# Patient Record
Sex: Female | Born: 1935
Health system: Southern US, Community
[De-identification: ages and names within clinical notes are randomized; demographics above are authoritative.]

## PROBLEM LIST (undated history)

## (undated) DIAGNOSIS — IMO0001 Reserved for inherently not codable concepts without codable children: Secondary | ICD-10-CM

## (undated) DIAGNOSIS — J45909 Unspecified asthma, uncomplicated: Secondary | ICD-10-CM

## (undated) DIAGNOSIS — I10 Essential (primary) hypertension: Secondary | ICD-10-CM

## (undated) DIAGNOSIS — J189 Pneumonia, unspecified organism: Secondary | ICD-10-CM

## (undated) DIAGNOSIS — I639 Cerebral infarction, unspecified: Secondary | ICD-10-CM

## (undated) DIAGNOSIS — H3530 Unspecified macular degeneration: Secondary | ICD-10-CM

## (undated) DIAGNOSIS — F32A Depression, unspecified: Secondary | ICD-10-CM

## (undated) DIAGNOSIS — I890 Lymphedema, not elsewhere classified: Secondary | ICD-10-CM

## (undated) DIAGNOSIS — F329 Major depressive disorder, single episode, unspecified: Secondary | ICD-10-CM

## (undated) DIAGNOSIS — G4730 Sleep apnea, unspecified: Secondary | ICD-10-CM

## (undated) DIAGNOSIS — H9190 Unspecified hearing loss, unspecified ear: Secondary | ICD-10-CM

## (undated) DIAGNOSIS — I4891 Unspecified atrial fibrillation: Secondary | ICD-10-CM

## (undated) DIAGNOSIS — N390 Urinary tract infection, site not specified: Secondary | ICD-10-CM

## (undated) DIAGNOSIS — J42 Unspecified chronic bronchitis: Secondary | ICD-10-CM

## (undated) DIAGNOSIS — M1990 Unspecified osteoarthritis, unspecified site: Secondary | ICD-10-CM

## (undated) DIAGNOSIS — K589 Irritable bowel syndrome without diarrhea: Secondary | ICD-10-CM

## (undated) DIAGNOSIS — C50919 Malignant neoplasm of unspecified site of unspecified female breast: Secondary | ICD-10-CM

## (undated) DIAGNOSIS — K219 Gastro-esophageal reflux disease without esophagitis: Secondary | ICD-10-CM

## (undated) DIAGNOSIS — M810 Age-related osteoporosis without current pathological fracture: Secondary | ICD-10-CM

## (undated) HISTORY — DX: Age-related osteoporosis without current pathological fracture: M81.0

## (undated) HISTORY — PX: JOINT REPLACEMENT: SHX530

## (undated) HISTORY — PX: TONSILLECTOMY: SUR1361

## (undated) HISTORY — PX: MASTECTOMY: SHX3

## (undated) HISTORY — PX: INNER EAR SURGERY: SHX679

## (undated) HISTORY — PX: BREAST BIOPSY: SHX20

---
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
## 1988-11-27 DIAGNOSIS — C50919 Malignant neoplasm of unspecified site of unspecified female breast: Secondary | ICD-10-CM

## 1988-11-27 HISTORY — DX: Malignant neoplasm of unspecified site of unspecified female breast: C50.919

## 1998-07-04 ENCOUNTER — Other Ambulatory Visit: Admission: RE | Admit: 1998-07-04 | Discharge: 1998-07-04 | Payer: Self-pay | Admitting: Obstetrics and Gynecology

## 1998-10-06 ENCOUNTER — Emergency Department (HOSPITAL_COMMUNITY): Admission: EM | Admit: 1998-10-06 | Discharge: 1998-10-06 | Payer: Self-pay | Admitting: *Deleted

## 1998-10-07 ENCOUNTER — Encounter: Payer: Self-pay | Admitting: Emergency Medicine

## 1999-09-03 ENCOUNTER — Encounter: Payer: Self-pay | Admitting: *Deleted

## 1999-09-03 ENCOUNTER — Encounter: Admission: RE | Admit: 1999-09-03 | Discharge: 1999-09-03 | Payer: Self-pay | Admitting: *Deleted

## 1999-10-05 ENCOUNTER — Other Ambulatory Visit: Admission: RE | Admit: 1999-10-05 | Discharge: 1999-10-05 | Payer: Self-pay | Admitting: Obstetrics and Gynecology

## 2000-03-31 ENCOUNTER — Encounter: Payer: Self-pay | Admitting: *Deleted

## 2000-03-31 ENCOUNTER — Ambulatory Visit (HOSPITAL_COMMUNITY): Admission: RE | Admit: 2000-03-31 | Discharge: 2000-03-31 | Payer: Self-pay | Admitting: *Deleted

## 2000-04-15 ENCOUNTER — Encounter: Payer: Self-pay | Admitting: *Deleted

## 2000-04-15 ENCOUNTER — Ambulatory Visit (HOSPITAL_COMMUNITY): Admission: RE | Admit: 2000-04-15 | Discharge: 2000-04-15 | Payer: Self-pay | Admitting: *Deleted

## 2000-04-29 ENCOUNTER — Encounter: Payer: Self-pay | Admitting: *Deleted

## 2000-04-29 ENCOUNTER — Ambulatory Visit (HOSPITAL_COMMUNITY): Admission: RE | Admit: 2000-04-29 | Discharge: 2000-04-29 | Payer: Self-pay | Admitting: *Deleted

## 2000-06-06 ENCOUNTER — Encounter: Admission: RE | Admit: 2000-06-06 | Discharge: 2000-08-17 | Payer: Self-pay | Admitting: *Deleted

## 2000-08-19 ENCOUNTER — Inpatient Hospital Stay (HOSPITAL_COMMUNITY): Admission: EM | Admit: 2000-08-19 | Discharge: 2000-08-20 | Payer: Self-pay

## 2000-08-20 ENCOUNTER — Encounter: Payer: Self-pay | Admitting: Neurology

## 2000-09-15 ENCOUNTER — Emergency Department (HOSPITAL_COMMUNITY): Admission: EM | Admit: 2000-09-15 | Discharge: 2000-09-15 | Payer: Self-pay | Admitting: Emergency Medicine

## 2000-09-19 ENCOUNTER — Encounter: Payer: Self-pay | Admitting: Internal Medicine

## 2000-09-19 ENCOUNTER — Encounter: Admission: RE | Admit: 2000-09-19 | Discharge: 2000-09-19 | Payer: Self-pay | Admitting: Internal Medicine

## 2001-02-20 ENCOUNTER — Other Ambulatory Visit: Admission: RE | Admit: 2001-02-20 | Discharge: 2001-02-20 | Payer: Self-pay | Admitting: Obstetrics and Gynecology

## 2002-04-20 ENCOUNTER — Ambulatory Visit (HOSPITAL_COMMUNITY): Admission: RE | Admit: 2002-04-20 | Discharge: 2002-04-20 | Payer: Self-pay | Admitting: Ophthalmology

## 2002-05-21 ENCOUNTER — Other Ambulatory Visit: Admission: RE | Admit: 2002-05-21 | Discharge: 2002-05-21 | Payer: Self-pay | Admitting: Obstetrics and Gynecology

## 2003-02-25 ENCOUNTER — Encounter: Admission: RE | Admit: 2003-02-25 | Discharge: 2003-03-13 | Payer: Self-pay | Admitting: Gastroenterology

## 2003-03-07 ENCOUNTER — Emergency Department (HOSPITAL_COMMUNITY): Admission: EM | Admit: 2003-03-07 | Discharge: 2003-03-07 | Payer: Self-pay | Admitting: *Deleted

## 2003-04-22 ENCOUNTER — Ambulatory Visit (HOSPITAL_COMMUNITY): Admission: RE | Admit: 2003-04-22 | Discharge: 2003-04-22 | Payer: Self-pay | Admitting: Chiropractic Medicine

## 2003-06-03 ENCOUNTER — Other Ambulatory Visit: Admission: RE | Admit: 2003-06-03 | Discharge: 2003-06-03 | Payer: Self-pay | Admitting: Obstetrics and Gynecology

## 2003-08-08 ENCOUNTER — Encounter: Admission: RE | Admit: 2003-08-08 | Discharge: 2003-08-08 | Payer: Self-pay | Admitting: Internal Medicine

## 2003-10-31 ENCOUNTER — Ambulatory Visit (HOSPITAL_COMMUNITY): Admission: RE | Admit: 2003-10-31 | Discharge: 2003-10-31 | Payer: Self-pay | Admitting: Urology

## 2004-01-20 ENCOUNTER — Inpatient Hospital Stay (HOSPITAL_COMMUNITY): Admission: EM | Admit: 2004-01-20 | Discharge: 2004-01-22 | Payer: Self-pay | Admitting: Emergency Medicine

## 2004-03-29 HISTORY — PX: TOTAL KNEE ARTHROPLASTY: SHX125

## 2004-05-22 ENCOUNTER — Ambulatory Visit (HOSPITAL_COMMUNITY): Admission: RE | Admit: 2004-05-22 | Discharge: 2004-05-22 | Payer: Self-pay | Admitting: Ophthalmology

## 2004-06-08 ENCOUNTER — Encounter: Admission: RE | Admit: 2004-06-08 | Discharge: 2004-06-08 | Payer: Self-pay | Admitting: Rheumatology

## 2004-06-22 ENCOUNTER — Ambulatory Visit (HOSPITAL_COMMUNITY): Admission: RE | Admit: 2004-06-22 | Discharge: 2004-06-22 | Payer: Self-pay | Admitting: Obstetrics and Gynecology

## 2004-07-27 ENCOUNTER — Encounter: Admission: RE | Admit: 2004-07-27 | Discharge: 2004-07-27 | Payer: Self-pay | Admitting: Internal Medicine

## 2004-10-05 ENCOUNTER — Ambulatory Visit (HOSPITAL_COMMUNITY): Admission: RE | Admit: 2004-10-05 | Discharge: 2004-10-05 | Payer: Self-pay | Admitting: Obstetrics and Gynecology

## 2005-01-04 ENCOUNTER — Encounter: Admission: RE | Admit: 2005-01-04 | Discharge: 2005-04-04 | Payer: Self-pay | Admitting: Internal Medicine

## 2006-04-19 ENCOUNTER — Other Ambulatory Visit: Admission: RE | Admit: 2006-04-19 | Discharge: 2006-04-19 | Payer: Self-pay | Admitting: Obstetrics and Gynecology

## 2006-05-18 ENCOUNTER — Ambulatory Visit (HOSPITAL_COMMUNITY): Admission: RE | Admit: 2006-05-18 | Discharge: 2006-05-18 | Payer: Self-pay | Admitting: Obstetrics and Gynecology

## 2006-06-22 ENCOUNTER — Ambulatory Visit: Payer: Self-pay | Admitting: Vascular Surgery

## 2006-06-24 ENCOUNTER — Ambulatory Visit: Payer: Self-pay | Admitting: Vascular Surgery

## 2008-11-19 ENCOUNTER — Other Ambulatory Visit: Admission: RE | Admit: 2008-11-19 | Discharge: 2008-11-19 | Payer: Self-pay | Admitting: Obstetrics and Gynecology

## 2009-06-02 ENCOUNTER — Emergency Department (HOSPITAL_COMMUNITY): Admission: EM | Admit: 2009-06-02 | Discharge: 2009-06-02 | Payer: Self-pay | Admitting: Emergency Medicine

## 2009-06-10 ENCOUNTER — Ambulatory Visit (HOSPITAL_COMMUNITY): Admission: RE | Admit: 2009-06-10 | Discharge: 2009-06-10 | Payer: Self-pay | Admitting: Chiropractic Medicine

## 2009-08-01 ENCOUNTER — Ambulatory Visit: Payer: Self-pay | Admitting: Surgery

## 2009-09-10 ENCOUNTER — Emergency Department (HOSPITAL_COMMUNITY): Admission: EM | Admit: 2009-09-10 | Discharge: 2009-09-10 | Payer: Self-pay | Admitting: Emergency Medicine

## 2009-10-17 ENCOUNTER — Encounter: Admission: RE | Admit: 2009-10-17 | Discharge: 2009-10-17 | Payer: Self-pay | Admitting: Gastroenterology

## 2010-01-05 ENCOUNTER — Other Ambulatory Visit: Admission: RE | Admit: 2010-01-05 | Discharge: 2010-01-05 | Payer: Self-pay | Admitting: Obstetrics and Gynecology

## 2010-06-14 LAB — URINE MICROSCOPIC-ADD ON

## 2010-06-14 LAB — BASIC METABOLIC PANEL
BUN: 18 mg/dL (ref 6–23)
CO2: 26 mEq/L (ref 19–32)
Calcium: 9.4 mg/dL (ref 8.4–10.5)
Chloride: 105 mEq/L (ref 96–112)
Creatinine, Ser: 0.83 mg/dL (ref 0.4–1.2)
GFR calc Af Amer: >60 mL/min (ref >60)
GFR calc non Af Amer: >60 mL/min (ref >60)
Glucose, Bld: 114 mg/dL — ABNORMAL HIGH (ref 70–99)
Potassium: 4.1 mEq/L (ref 3.5–5.1)
Sodium: 139 mEq/L (ref 135–145)

## 2010-06-14 LAB — DIFFERENTIAL
Basophils Absolute: 0 10*3/uL (ref 0.0–0.1)
Basophils Relative: 1 % (ref 0–1)
Eosinophils Absolute: 0.1 10*3/uL (ref 0.0–0.7)
Eosinophils Relative: 2 % (ref 0–5)
Lymphocytes Relative: 23 % (ref 12–46)
Lymphs Abs: 1.3 10*3/uL (ref 0.7–4.0)
Monocytes Absolute: 0.4 10*3/uL (ref 0.1–1.0)
Monocytes Relative: 8 % (ref 3–12)
Neutro Abs: 3.7 10*3/uL (ref 1.7–7.7)
Neutrophils Relative %: 67 % (ref 43–77)

## 2010-06-14 LAB — URINALYSIS, ROUTINE W REFLEX MICROSCOPIC
Bilirubin Urine: NEGATIVE
Glucose, UA: NEGATIVE mg/dL
Ketones, ur: NEGATIVE mg/dL
Nitrite: NEGATIVE
Protein, ur: NEGATIVE mg/dL
Specific Gravity, Urine: 1.01 (ref 1.005–1.030)
Urobilinogen, UA: 0.2 mg/dL (ref 0.0–1.0)
pH: 7 (ref 5.0–8.0)

## 2010-06-14 LAB — CBC
HCT: 38.3 % (ref 36.0–46.0)
Hemoglobin: 12.8 g/dL (ref 12.0–15.0)
MCHC: 33.4 g/dL (ref 30.0–36.0)
MCV: 83.5 fL (ref 78.0–100.0)
Platelets: 286 10*3/uL (ref 150–400)
RBC: 4.58 MIL/uL (ref 3.87–5.11)
RDW: 13.6 % (ref 11.5–15.5)
WBC: 5.5 10*3/uL (ref 4.0–10.5)

## 2010-06-30 ENCOUNTER — Other Ambulatory Visit (HOSPITAL_COMMUNITY): Payer: Self-pay | Admitting: Chiropractic Medicine

## 2010-06-30 ENCOUNTER — Ambulatory Visit (HOSPITAL_COMMUNITY)
Admission: RE | Admit: 2010-06-30 | Discharge: 2010-06-30 | Disposition: A | Discharge status: Home / Self Care | Payer: Medicare Other | Source: Ambulatory Visit | Attending: Chiropractic Medicine | Admitting: Chiropractic Medicine

## 2010-06-30 DIAGNOSIS — R52 Pain, unspecified: Secondary | ICD-10-CM

## 2010-06-30 DIAGNOSIS — I517 Cardiomegaly: Secondary | ICD-10-CM | POA: Insufficient documentation

## 2010-06-30 DIAGNOSIS — I700 Atherosclerosis of aorta: Secondary | ICD-10-CM | POA: Insufficient documentation

## 2010-06-30 DIAGNOSIS — R0789 Other chest pain: Secondary | ICD-10-CM | POA: Insufficient documentation

## 2010-08-14 NOTE — Consult Note (Signed)
[name], [name]                ACCOUNT NO.:  [id number]   MEDICAL RECORD NO.:  [id number]          PATIENT TYPE:  INP   LOCATION:  5511                         FACILITY:  [facility]   PHYSICIAN:  Michael L. Reynolds, M.D.DATE OF BIRTH:  12-26-1933   DATE OF CONSULTATION:  01/20/2004  DATE OF DISCHARGE:                                   CONSULTATION   CHIEF COMPLAINT:  Difficulty with speech and dizziness.   HISTORY OF PRESENT ILLNESS:  This is one of several [name] admissions and the second stroke service admission for this  77 year old woman with a past medical history which includes hypertension  and a previous TIA in May of 2002 for which she was hospitalized under the  stroke service and seen by myself and Dr. Genene Churn. [name].  No particular  etiology was identified at that time other than her known risk factors and  she was discharged on Plavix.   Today, she was at the department store and at approximately 2:30 p.m.  noticed the acute onset of a dizzy sensation which she describes as more  of a lightheadedness than true vertigo.  She denies any associated focal  neurologic symptoms at that time.  The staff at the store called the EMS and  the patient was transported to the [facility] ED per the  R.N. reports.  The patient arrived with slurred speech and left-sided  weakness.   The patient's husband concurred that her speech was not right and that she  seemed slow to respond to questions.  She also had decreased memory but on  specific questioning about this it sounds like she had particular difficulty  finding words and names.  Code stroke was called.  The patient's symptoms  subsequently resolved entirely and she is feeling back to baseline at this  time.  She notes that she did not eat much all day today and wonder if she  had a blood sugar problem.  She denies any history of any previous similar  symptoms.  There is no  associated chest pain, palpitations, shortness of  breath, nausea, vomiting, or headache.   PAST MEDICAL HISTORY:  1.  TIA in May of 2002 as noted above.  She had left-sided weakness at that      time which resolved.  2.  Hypertension which is under good control.  3.  She has a history of breast cancer with bilateral mastectomies.  4.  She also has a history of gastroesophageal reflux disease and irritable      bowel syndrome.   PAST SURGICAL HISTORY:  1.  Right mastoidectomy at age 47 which resulted in right facial paralysis      since that time.  2.  Mastectomies as above.   FAMILY HISTORY:  Remarkable for stroke in both parents.   SOCIAL HISTORY:  She lives with her husband.  She is independent in her  activities of daily living.  She has not used tobacco or alcohol.   ALLERGIES:  No known drug allergies.   MEDICATIONS:  1.  Plavix  75 mg q.d.  2.  Misoprostol 5 mg 1/2 tablet q.d.  3.  Plendil 5 mg q.d.  4.  Protonix 40 mg q.d.  5.  Librax 1 tablet t.i.d. p.r.n.  6.  Diuretic which she has been on for two weeks.   REVIEW OF SYMPTOMS:  Negative across 10 systems except as outlined in the  HPI and in the ER and admission nursing records.   PHYSICAL EXAMINATION:  VITAL SIGNS:  Temperature 96.7, blood pressure  124/64, pulse 63, respirations 18.  GENERAL:  This is an obese but otherwise healthy-appearing woman supine in  the hospital bed in no evident stress.  HEENT:  Normocephalic and atraumatic.  Oropharynx is benign.  NECK:  Supple without carotid bruits.  HEART:  Regular rate and rhythm.  CHEST:  Clear to auscultation.  Defects of bilateral mastectomies are noted.  ABDOMEN:  Obese, soft.  Normal active bowel sounds.  EXTREMITIES:  There are 2+ pulses.  There is 1+ edema.  NEUROLOGICAL:  Mental status shows she is awake, alert, and fully oriented.  Speech is fluent and not dysarthric.  She has no obvious difficulty with  naming or performing complex commands but she  does have some trouble with  repeating a complex phrase.  Attention span, concentration, and fund of  knowledge are appropriate.  Recent and remote memory are intact.  Cranial  nerves show funduscopic exam is benign.  Pupils are equal and briskly  reactive.  Extraocular movements are full without nystagmus.  Visual fields  are full to confrontation.  Face, tongue, and palate move normally and  symmetrically.  Motor shows normal bulk and tone.  Normal strength in all  tested extremity muscles.  Sensation is intact to light touch and pinprick  throughout.  Coordination and rapid movements are performed well.  Finger-to-  nose is performed adequately.  Gait shows she rises easily from the bed.  She stands normally.  Able to ambulate a few steps without difficulty.  Reflexes show 1+ and symmetric.  Toes are downgoing.   LABORATORY DATA:  CBC shows a white count of 5.2, hemoglobin of 12.4,  platelets 307,000.  BMET is remarkable only for an elevated glucose of 114  and a low sodium of 128 with low chloride.  LFTs are normal.  Coagulases are  normal.  Cardiac enzymes are negative.  Urine drug screen reveals  benzodiazepines.   EKG reveals no acute findings.  CT of the head reveals fairly severe white  mater disease and several old lacunes particularly on the right vasoganglia  and internal capsule but no acute findings.   IMPRESSION:  1.  Transient speech dysfunction and questionable left-sided weakness:  She      does have significant cerebrovascular events and a history of transient      ischemic attack and this likely was a transient ischemic attack.  It      also possibly could have represented a hypoglycemic event.  2.  Hypertension.  3.  Hyperglycemia.  4.  Hyponatremia likely due to diuretics.   PLAN:  We will admit for routine stroke workup including MRI, MRA, carotid dopplers, echocardiogram, and carotid transcranial dopplers.  Echocardiogram  and stroke labs.  We may end of  changing her Plavix to Aggrenox.  Stroke  service to follow.       MLR/MEDQ  D:  01/20/2004  T:  01/21/2004  Job:  [id number]

## 2010-08-14 NOTE — Discharge Summary (Signed)
[place]. [facility]  Patient:    [name], [name]                       MRN: [id number] Adm. Date:  [id number] Disc. Date: [id number] Attending:  Fenton Malling CC:         Barbette Hair. Vaughan Basta., M.D.   Discharge Summary  This is one of several [facility] admissions for this 77 year old right-handed, white, married female from [place], [place] admitted from the emergency room for evaluation of transient left-sided weakness.  HISTORY OF PRESENT ILLNESS:  This patient has never had a clinical stroke or upset of amaurosis fugax.  She has a long known prior history of hypertension for approximately two years under good control with Plendil, though it took a long time for her to get on this medication due to adverse reactions to antihypertensives, including triamterene and hydrochloride.  The day of admission she was sitting and noted the onset of an electrical sensation about 8:30 in the evening, radiating down her left arm.  She tried to lift it and noted that her left arm flopped.  She stood to walk and her left leg was weak as well.  She came to the emergency room and by the time she had arrived, most symptomatology had resolved.  There was no associated headache, chest pain, or palpitations, syncope, or seizure.  She has no prior history of similar events.  She has not been on aspirin therapy.  She does not smoke cigarettes.  PAST MEDICAL HISTORY:  Significant for a two-year history of hypertension, breast cancer nine years ago, and is status post bilateral mastectomies with residual lymphedema in both arms, irritable bowel syndrome, and gastroesophageal reflux disease.  MEDICATIONS:  At the time of admission include: 1. Aciphex 20 mg q.d. 2. Plendil 5 mg q.d. 3. Aygestin 1/2 tablet q.d. 4. Estrace 1 tablet q.d. 5. Calcium citrate 500 mg t.i.d.  She does not smoke cigarettes.  She does not drink alcohol.  SOCIAL HISTORY:  She  lives with her husband.  She has been independent in her activities of daily living, including driving her car.  PHYSICAL EXAMINATION:  GENERAL:  Revealed a well-developed, pleasant, white female.  VITAL SIGNS:  Blood pressure 194/92, heart rate of 96, respiratory rate 20, temperature of 99.0.  EXTREMITIES:  Remarkable for lymphedema in her arms.  HEENT/NECK:  The thyroid was not enlarged.  Tympanic membranes were clear.  LUNGS:  Clear.  HEART:  Revealed no murmur.  ABDOMEN:  Unremarkable.  She did have lymphedema, as mentioned, in both upper extremities.  NEUROLOGIC:  Normal, except for some questionable left-hand clumsiness.  LABORATORY DATA:  A 12-lead EKG showed nonspecific ST-T wave changes, chest x-ray showed no significant abnormalities, CT scan of the brain without contrast showed old left cerebellar infarction with encephalomalacia and underlying bilateral periventricular small-vessel ischemic disease, MRI of the brain showed bilateral periventricular small-vessel ischemic disease, DWI studies showed no evidence of an acute stroke.  An MR angiogram of the neck was unremarkable.  An MR intracranial angiogram was unremarkable. Initial hemoglobin 12.6 with hematocrit of 37.0, white blood cell count 7600, platelets 345,000.  Initial glucose was 135 with a sodium of 132, potassium 3.5, chloride 110, CO2 content 23, BUN 19, creatinine 0.9.  Total protein 7.2, albumin 3.5, AST 18, and ALT of 19.  Pro time 13.5, PTT 32.  The repeat basic metabolic panel revealed sodium 140, potassium of 3.4, chloride  of 111, CO2 content 24, glucose 130.  The BUN was 12, creatinine 0.7, calcium 8.5.  A hemoglobin A1c was 6.1.  HOSPITAL COURSE:  The patients symptomatology had resolved by the time she was evaluated by Dr. Thad Ranger.  It was felt that she most likely had a TIA. MR angiography of the extracranial and intracranial circulation showed no evidence of large vessel stenotic disease.   She was kept on telemetry on 3000, and found to be in normal sinus rhythm.  Chest x-ray, AP and lateral of the chest showed significant abnormalities, as noted above.  She had no recurrent symptoms in the hospital and tolerated her aspirin well.  It was decided to discharge her to return as an outpatient for further evaluation with lipid profile and homocysteine levels.  IMPRESSION: 1. Right brain transient ischemic attack, code 435.9. 2. Asymptomatic, so-called silent bicerebral small-vessel disease, ischemic    strokes and old left cerebellar stroke, code 433.31. 3. Hypertension, code 796.2. 4. History of gastroesophageal reflux disease. 5. History of breast cancer with residual upper extremity lymphedema, code    174.9.  PLAN:  Discharge the patient on no driving.  Enteric coated aspirin 325 mg per day and Aciphex 20 mg per day or Protonix 40 mg per day.  She will return in one week for consideration of lipid profile, homocysteine level.  She will return to see Dr. Merril Abbe in three weeks. DD:  08/20/00 TD:  08/22/00 Job: [id number]

## 2010-08-14 NOTE — Discharge Summary (Signed)
[place]. [facility]  Patient:    [name], [name]                       MRN: [id number] Adm. Date:  [id number] Disc. 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 DD:  08/20/00 TD:  08/22/00 Job: [id number]

## 2010-08-14 NOTE — Op Note (Signed)
[name], [name]                ACCOUNT NO.:  [id number]   MEDICAL RECORD NO.:  [id number]          PATIENT TYPE:  OUT   LOCATION:  ULT                           [facility]   PHYSICIAN:  Guadelupe Sabin, M.D.DATE OF BIRTH:  12-06-1933   DATE OF PROCEDURE:  06/19/2004  DATE OF DISCHARGE:  06/22/2004                                 OPERATIVE REPORT   PREOPERATIVE DIAGNOSIS:  Senile cataract, left eye.   POSTOPERATIVE DIAGNOSIS:  Senile cataract, left eye.   OPERATION:  Planned extracapsular cataract extraction - phacoemulsification,  primary insertion of posterior chamber intraocular lens implant.   SURGEON:  Guadelupe Sabin, M.D.   ASSISTANT:  Nurse.   ANESTHESIA:  Local 4% Xylocaine, 0.75 Marcaine retrobulbar block with Wydase  added, topical tetracaine, intraocular Xylocaine.  Anesthesia standby  required in this patient.   DESCRIPTION OF PROCEDURE:  After the patient was prepped and draped, a lid  speculum was inserted in the left eye.  The eye was turned downward and a  superior rectus traction suture placed.  Schiotz tonometry was recorded  between 5-10 scale units with a 5.5 gram weight.  A peritomy was performed  adjacent to the limbus from the 11 to 1 o'clock position. The corneoscleral  junction was cleaned, and a corneoscleral groove made with a 45 degree  Superblade.  The anterior chamber was then entered with the 2.5 mm diamond  keratome at the 12 o'clock position and a 15 degree blade at the 2:30  position.  Using a bent 26 gauge needle on an [place] syringe, a circular  capsular rhexis was begun and then completed with the Grabow forceps.  Hydrodissection and hydrodelineation were performed using 1% Xylocaine.  The  30 degree phacoemulsification tip was then inserted with slow controlled  emulsification of the lens nucleus with back cracking with the Bechert pick.  Total ultrasonic time 1 minute 5 seconds, average power level 17%, total  amount of fluid  used 50  mL.  Following removal of the nucleus, the residual  cortex was aspirated with the irrigation-aspiration tip.  The posterior  capsule appeared intact with a brilliant red fundus reflex.  It was  therefore elected to insert an Allergan medical optics SI 40 NB silicone  three-piece posterior chamber implant, diopter strength +13.50.  This was  inserted with the McDonald forceps into the anterior chamber and then  centered into the capsular bag using the [facility] lens rotator.  The lens  appeared to be well-centered.  The Ocucoat and Provisc which had been used  intermittently during the procedures were aspirated and replaced with  balanced salt solution and Miochol ophthalmic solution.  The operative  incisions were self-sealing. It was, however, elected to place a single 10-0  interrupted nylon suture across the 12 o'clock incision to ensure closure  and to prevent endophthalmitis.  Maxitrol ointment was instilled in the  conjunctival cul-de-sac and a light patch and protector shield applied.  Duration of procedure 45 minutes.  The patient tolerated the procedure well  in general, left the operating room for the recovery room in  good condition.      HNJ/MEDQ  D:  07/26/2004  T:  07/26/2004  Job:  [id number]

## 2010-08-14 NOTE — H&P (Signed)
[name], [name]                ACCOUNT NO.:  [id number]   MEDICAL RECORD NO.:  [id number]          PATIENT TYPE:  OUT   LOCATION:  ULT                           FACILITY[facility]  [facility]   PHYSICIAN:  Guadelupe Sabin, M.D.DATE OF BIRTH:  December 05, 1933   DATE OF ADMISSION:  06/22/2004  DATE OF DISCHARGE:  06/22/2004                                HISTORY & PHYSICAL   REASON FOR ADMISSION:  This was a planned outpatient readmission of this 77-  year-old white female admitted for cataract implant surgery of the left eye.   HISTORY OF PRESENT ILLNESS:  This patient was noted to have deterioration of  vision in both eyes due to progressive cataract formation.  She was  previously admitted on April 21, 2003, for uncomplicated cataract implant  surgery of the right eye. The patient did well following this procedure.  Gradually she has developed similar cataract formation of the left eye.  She  was given oral discussion and printed information once again about the  procedure and its possible complications.  She signed an informed consent,  and arrangements were made for her outpatient admission at this time.   PAST MEDICAL HISTORY/MEDICATIONS:  The patient continues under the care of  Dr. Merril Abbe, taking multiple medications including Plendil, Zyrtec,  Plavix, Flonase spray, calcium, multivitamins, glucosamine, Oxytrol.   ALLERGIES:  She is said to be allergic to CIPRO AND QUINOLONE.   REVIEW OF SYSTEMS:  No cardiorespiratory complaints.   PHYSICAL EXAMINATION:  GENERAL:  The patient is a pleasant 77 year old  female in no acute distress.  HEENT:  EYES:  Visual acuity recorded at 20/30 right eye, 20/40- left eye.  Applanation tonometry 18 mm, right eye, 16 left eye. Slit lamp examination:  The eyes are white and clear.  The patient has had previous blepharoplasty  surgery by Dr. Fernande Bras.  The cornea is clear. Anterior chamber deep and  clear.  The right eye shows a well-centered posterior  chamber implant with a  slight fibrosis of the posterior capsule.  A nuclear cataract is present in  the left eye.  Dilated detailed fundus examination of both eyes shows a  clear vitreous attached retina.  The optic nerve is sharply outlined, of  good color, disk-cup ratio of 0.3.  Blood vessels and macula normal.  CHEST/LUNGS:  Clear to percussion and auscultation.  HEART:  Normal sinus rhythm, no arrhythmias or North cardiomegaly. ABDOMEN:  Negative.  EXTREMITIES:  Negative.   ADMISSION DIAGNOSES:  1.  Senile cataract left eye.  2.  Pseudophakia, right eye.   SURGICAL PLAN:  Cataract implant surgery, left eye.      HNJ/MEDQ  D:  07/26/2004  T:  07/26/2004  Job:  [id number]

## 2010-08-14 NOTE — Op Note (Signed)
NAME:  [name], [name]                          ACCOUNT NO.:  [id number]   MEDICAL RECORD NO.:  [id number]                   PATIENT TYPE:  OIB   LOCATION:  2875                                 FACILITY:  [facility]   PHYSICIAN:  Guadelupe Sabin, M.D.             DATE OF BIRTH:  09-02-1933   DATE OF PROCEDURE:  04/20/2002  DATE OF DISCHARGE:                                 OPERATIVE REPORT   PREOPERATIVE DIAGNOSES:  1. Nuclear cataract, right eye.  2. High myopia.   POSTOPERATIVE DIAGNOSES:  1. Nuclear cataract, right eye.  2. High myopia.   OPERATION:  Planned extracapsular cataract extraction, Fickle  emulsification, primary insertion of posterior chamber intraocular lens  implant, right eye.   SURGEON:  Guadelupe Sabin, M.D.   ASSISTANT:  Nurse.   ANESTHESIA:  Local 4% Xylocaine, 0.75 Marcaine retrobulbar block, topical  tetracaine, intraocular Xylocaine.  Anesthesia stand-by required.  The patient was given intravenous Ditropan  during the period of retrobulbar blocking.   DESCRIPTION OF PROCEDURE:  After the patient was prepped and draped, a lid  speculum was inserted in the right eye.  The eye was turned downward, and a  superior rectus traction suture placed.  Schiotz tonometry was recorded at 7  scaled units with a 5.5 g weight.  A peritomy was performed adjacent to the  limbus from the 11 to 1 o'clock position.  The corneoscleral junction was  cleaned, and a corneoscleral groove was made with a 45 degree Superblade.  The anterior chamber was then entered with a 2.5 mm diamond keratome at the  12 o'clock position, and the 15 degree blade at the 2:30 position.  Using a  bent 26 gauge needle on a Healon syringe, a circular capsular rectus was  begun, and then completed with the Grabow forceps.  Hydrodissection and  hydrodelineation were performed using 1% Xylocaine.  A 30 degree Fickle  emulsification tip was then inserted with slow controlled emulsification of  the  lens nucleus.  Total ultrasonic time was approximately 1 to 1-1/2  minutes.  Following removal of the nucleus, the residual cortex was  aspirated with the irrigation aspiration tip.  The posterior capsule  appeared intact with a brilliant red fundus reflex.  It was therefore  elected to insert an [employer] medical optics [id number] silicone three piece  posterior chamber intraocular lens implant.  Diopter strength +13.50.  This  was inserted with the McDonald forceps into the anterior chamber, and then  centered into the capsular bag using the [facility] lens rotator.  The lens  appeared to be well centered.  The Healon which had been used during and  throughout the procedure was aspirated and replaced with balanced salt  solution and Miochol ophthalmic solution.  The operative incisions  appeared to be self-sealing, and no sutures were required.  Maxitrol  ointment was instilled in the conjunctival cul-de-sac, and a light patch and  protective shield applied.  Duration of procedure was 45 minutes.  The  patient tolerated the procedure well in general, left the operating room for  the recovery room in good condition.                                               Guadelupe Sabin, M.D.    HNJ/MEDQ  D:  04/20/2002  T:  04/20/2002  Job:  [id number]

## 2010-08-14 NOTE — Procedures (Signed)
DUPLEX DEEP VENOUS EXAM - LOWER EXTREMITY   INDICATION:  Left leg edema, rule out DVT.   HISTORY:  Edema:  Left lower extremity swelling for 8 weeks since fall.  Trauma/Surgery:  Larey Seat on a treadmill 8 weeks ago and injured the left  shin.  Pain:  No.  PE:  No.  Previous DVT:  No.  Anticoagulants:  Yes.  Other:   DUPLEX EXAM:                CFV   SFV   PopV  PTV    GSV                R  L  R  L  R  L  R   L  R  L  Thrombosis    o  o     o     o      o     o  Spontaneous   +  +     +     +      +     +  Phasic        +  +     +     +      +     +  Augmentation  +  +     +     +      +     +  Compressible  +  +     +     +      +     +  Competent     +  +     +     +      +     +   Legend:  + - yes  o - no  p - partial  D - decreased   IMPRESSION:  No evidence of deep venous thrombosis noted in the left  lower extremity.   A preliminary report was faxed to Dr. Silvano Rusk office on 08/01/2009.    _____________________________  V. Charlena Cross, MD   CH/MEDQ  D:  08/04/2009  T:  08/04/2009  Job:  [id number]

## 2010-08-14 NOTE — Discharge Summary (Signed)
[name], [name]                ACCOUNT NO.:  [id number]   MEDICAL RECORD NO.:  [id number]          PATIENT TYPE:  INP   LOCATION:  5511                         FACILITY:  [facility]   PHYSICIAN:  Pramod P. Pearlean Brownie, MD    DATE OF BIRTH:  [birth year]   DATE OF ADMISSION:  01/20/2004  DATE OF DISCHARGE:  01/22/2004                                 DISCHARGE SUMMARY   ADMISSION DIAGNOSIS:  TIA.   POSTOPERATIVE DIAGNOSES:  1.  Posterior circulation transient ischemic attack.  2.  Previous history of transient ischemic attack and cardiovascular      disease.  3.  Hypertension.  4.  Hyperlipidemia.  5.  Hypercholesterolemia.   HOSPITAL COURSE:  [name] is a 77 year old Caucasian lady who has a known  previous history of TIA in May 2002, as well as hypertension; who presented  with symptoms with sudden onset of dizziness, lightheadedness as well as  transient slurred speech and left-sided weakness, as per her husband.  Please see Dr. Illene Regulus H&P for details.   Patient's symptoms resolved soon after she came to the ER. A stroke code was  called, but due to quick resolution of her symptoms, aggressive treatment  was not done.  Noncontrast CAT scan of the head was obtained which showed  only small vessel type chronic ischemic changes without any acute findings.   The patient was admitted to the stroke service for risk stratification  evaluation.  MRI scan of the brain did not reveal any acute stroke.  Old  left cerebral infarct as well as bilateral subcortical arachnoid infarcts  and changes of small vessel type micro-__________ changes were seen.  MRA of  the intracranial circulation revealed moderate atheromatous changes in both  cavernous carotids.  Cardiac echo revealed normal ejection fraction without  any obvious cardiac source of embolism.  Telemetry monitoring did not reveal  cardiac arrhythmias.  Carotid ultrasound did not reveal any high grade  stenosis.   LABORATORY  DATA:  Elevated cholesterol of 209 and LDL of 133.  Homosysteine  was elevated at 13.75.  Hemoglobin A1c was normal.   The patient was started on Foltex for elevated homocysteine.  Starting  therapy was considered, however, the patient refused this and said she would  like to discuss this with her primary physician, Dr. Merril Abbe before  starting Plavix.  She had previously been on Plavix and was advised to  change this to Aggrenox for secondary stroke prevention.  However, the  patient refused to do so due to concerns about headache.   She was asymptomatic at the time of discharge and was advised to call her  regular medical doctor as needed.   DISCHARGE MEDICATIONS:  1.  Plavix 75 mg a day.  2.  Foltx 1 tablet daily.  3.  Plendil 5 mg a day.  4.  Misoprostol 5 mg 1/2 tablet daily.  5.  Protonix 40 mg a day.  6.  Librax 1 tablet daily.  The patient had previously been on a diuretic and she was advised to  discontinue this.   She was asked  to follow-up with Dr. Pearlean Brownie in his office in 2 months and with  Dr. Merril Abbe, her primary physician in a few weeks.       PPS/MEDQ  D:  02/15/2004  T:  02/15/2004  Job:  [id number]

## 2010-08-14 NOTE — H&P (Signed)
NAME:  [name], [name]                          ACCOUNT NO.:  [id number]   MEDICAL RECORD NO.:  [id number]                   PATIENT TYPE:  OIB   LOCATION:  2875                                 FACILITY:  [facility]   PHYSICIAN:  Guadelupe Sabin, M.D.             DATE OF BIRTH:  [birth year]   DATE OF ADMISSION:  04/20/2002  DATE OF DISCHARGE:                                HISTORY & PHYSICAL   REASON FOR ADMISSION:  This was a planned outpatient surgical admission of  this 77 year old white female admitted for cataract implant surgery of the  right eye.   HISTORY OF PRESENT ILLNESS:  This patient has a long history of high myopia  in both eyes.  She has recently gradually developed cataract formation in  both eyes.  When her vision could not longer be improved with her high  myopathic glasses, she has elected to proceed with cataract implant surgery.  Visual acuity was recorded last at 20/50- to 20/60 right eye, 20/30 to 20/40-  left eye.   PAST MEDICAL HISTORY:  1. The patient is in stable general health under the care of Dr. Merril Abbe.  2. Chronic arthritis.  3. Hypertension.  4. Status post breast surgery.  5. Asthma.  6. Diverticulitis.  7. Has had small transient ischemic attacks.   CURRENT MEDICATIONS:  1. Plendil.  2. Plavix.  3. Flonase.  4. Nexium.  5. Zyrtec.  6. Glucosamine.  7. Vitamins.   REVIEW OF SYMPTOMS:  No current cardiorespiratory complaints.  The patient  states she has lymphedema and cannot have her IV placed in her arms, and  have blood pressure recorded in her thighs or leg.   PHYSICAL EXAMINATION:  VITAL SIGNS:  As recorded on admission:  Blood  pressure 166/69, respirations 16, temperature 98.3, heart rate 59.  GENERAL:  The patient is a pleasant, well-nourished, slightly anxious 77-  year-old white female in no acute distress.  HEENT:  Eyes:  Visual acuity as recorded above.  Slit lamp examination:  The  eyes are white and clear with a clear  cornea deep and clear anterior  chamber, and nuclear cataract formation.  Detailed fundus examination  reveals a clear vitreous, attached retina with normal optic nerve blood  vessels and macula.  CHEST:  Lungs are clear to auscultation and percussion.  HEART:  Normal sinus rhythm, no cardiomegaly, no murmurs.  ABDOMEN:  Negative.  EXTREMITIES:  Negative.   ADMISSION DIAGNOSES:  1. Senile nuclear cataract, both eyes.  2. High myopia, both eyes.   SURGICAL PLAN:  Cataract implant surgery, right eye now, left eye later.                                               Guadelupe Sabin, M.D.  HNJ/MEDQ  D:  04/20/2002  T:  04/20/2002  Job:  [id number]   cc:   Ike Bene, M.D.  [address]  [place]  [place] [address]  Fax: [phone]

## 2010-08-18 ENCOUNTER — Emergency Department (HOSPITAL_COMMUNITY): Payer: Medicare Other

## 2010-08-18 ENCOUNTER — Encounter (HOSPITAL_COMMUNITY): Payer: Self-pay

## 2010-08-18 ENCOUNTER — Inpatient Hospital Stay (HOSPITAL_COMMUNITY)
Admission: EM | Admit: 2010-08-18 | Discharge: 2010-08-20 | DRG: 312 | Disposition: A | Discharge status: Home / Self Care | Payer: Medicare Other | Attending: Internal Medicine | Admitting: Internal Medicine

## 2010-08-18 DIAGNOSIS — I4891 Unspecified atrial fibrillation: Secondary | ICD-10-CM | POA: Diagnosis present

## 2010-08-18 DIAGNOSIS — F329 Major depressive disorder, single episode, unspecified: Secondary | ICD-10-CM | POA: Diagnosis present

## 2010-08-18 DIAGNOSIS — E785 Hyperlipidemia, unspecified: Secondary | ICD-10-CM | POA: Diagnosis present

## 2010-08-18 DIAGNOSIS — W010XXA Fall on same level from slipping, tripping and stumbling without subsequent striking against object, initial encounter: Secondary | ICD-10-CM | POA: Diagnosis present

## 2010-08-18 DIAGNOSIS — R29810 Facial weakness: Secondary | ICD-10-CM | POA: Diagnosis present

## 2010-08-18 DIAGNOSIS — S0180XA Unspecified open wound of other part of head, initial encounter: Secondary | ICD-10-CM | POA: Diagnosis present

## 2010-08-18 DIAGNOSIS — Z87440 Personal history of urinary (tract) infections: Secondary | ICD-10-CM

## 2010-08-18 DIAGNOSIS — Z7901 Long term (current) use of anticoagulants: Secondary | ICD-10-CM

## 2010-08-18 DIAGNOSIS — I10 Essential (primary) hypertension: Secondary | ICD-10-CM | POA: Diagnosis present

## 2010-08-18 DIAGNOSIS — Z8673 Personal history of transient ischemic attack (TIA), and cerebral infarction without residual deficits: Secondary | ICD-10-CM

## 2010-08-18 DIAGNOSIS — Z853 Personal history of malignant neoplasm of breast: Secondary | ICD-10-CM

## 2010-08-18 DIAGNOSIS — M545 Low back pain, unspecified: Secondary | ICD-10-CM | POA: Diagnosis present

## 2010-08-18 DIAGNOSIS — Z96659 Presence of unspecified artificial knee joint: Secondary | ICD-10-CM

## 2010-08-18 DIAGNOSIS — M1710 Unilateral primary osteoarthritis, unspecified knee: Secondary | ICD-10-CM | POA: Diagnosis present

## 2010-08-18 DIAGNOSIS — R269 Unspecified abnormalities of gait and mobility: Secondary | ICD-10-CM | POA: Diagnosis present

## 2010-08-18 DIAGNOSIS — G4733 Obstructive sleep apnea (adult) (pediatric): Secondary | ICD-10-CM | POA: Diagnosis present

## 2010-08-18 DIAGNOSIS — F3289 Other specified depressive episodes: Secondary | ICD-10-CM | POA: Diagnosis present

## 2010-08-18 DIAGNOSIS — Y92009 Unspecified place in unspecified non-institutional (private) residence as the place of occurrence of the external cause: Secondary | ICD-10-CM

## 2010-08-18 DIAGNOSIS — R55 Syncope and collapse: Principal | ICD-10-CM | POA: Diagnosis present

## 2010-08-18 DIAGNOSIS — E876 Hypokalemia: Secondary | ICD-10-CM | POA: Diagnosis present

## 2010-08-18 DIAGNOSIS — K219 Gastro-esophageal reflux disease without esophagitis: Secondary | ICD-10-CM | POA: Diagnosis present

## 2010-08-18 DIAGNOSIS — D509 Iron deficiency anemia, unspecified: Secondary | ICD-10-CM | POA: Diagnosis present

## 2010-08-18 LAB — DIFFERENTIAL
Basophils Absolute: 0 10*3/uL (ref 0.0–0.1)
Basophils Relative: 0 % (ref 0–1)
Eosinophils Absolute: 0.1 10*3/uL (ref 0.0–0.7)
Eosinophils Relative: 1 % (ref 0–5)
Lymphocytes Relative: 10 % — ABNORMAL LOW (ref 12–46)
Lymphs Abs: 0.9 10*3/uL (ref 0.7–4.0)
Monocytes Absolute: 0.5 10*3/uL (ref 0.1–1.0)
Monocytes Relative: 5 % (ref 3–12)
Neutro Abs: 8.1 10*3/uL — ABNORMAL HIGH (ref 1.7–7.7)
Neutrophils Relative %: 84 % — ABNORMAL HIGH (ref 43–77)

## 2010-08-18 LAB — BASIC METABOLIC PANEL
BUN: 19 mg/dL (ref 6–23)
CO2: 26 mEq/L (ref 19–32)
Calcium: 9.4 mg/dL (ref 8.4–10.5)
Chloride: 102 mEq/L (ref 96–112)
Creatinine, Ser: 0.9 mg/dL (ref 0.4–1.2)
GFR calc Af Amer: >60 mL/min (ref >60)
GFR calc non Af Amer: >60 mL/min (ref >60)
Glucose, Bld: 108 mg/dL — ABNORMAL HIGH (ref 70–99)
Potassium: 4 mEq/L (ref 3.5–5.1)
Sodium: 137 mEq/L (ref 135–145)

## 2010-08-18 LAB — PROTIME-INR
INR: 1.24 (ref 0.00–1.49)
Prothrombin Time: 15.8 seconds — ABNORMAL HIGH (ref 11.6–15.2)

## 2010-08-18 LAB — URINALYSIS, ROUTINE W REFLEX MICROSCOPIC
Bilirubin Urine: NEGATIVE
Glucose, UA: NEGATIVE mg/dL
Ketones, ur: NEGATIVE mg/dL
Nitrite: NEGATIVE
Protein, ur: NEGATIVE mg/dL
Specific Gravity, Urine: 1.01 (ref 1.005–1.030)
Urobilinogen, UA: 0.2 mg/dL (ref 0.0–1.0)
pH: 7 (ref 5.0–8.0)

## 2010-08-18 LAB — URINE MICROSCOPIC-ADD ON

## 2010-08-18 LAB — CBC
HCT: 38.6 % (ref 36.0–46.0)
Hemoglobin: 12.3 g/dL (ref 12.0–15.0)
MCH: 26.1 pg (ref 26.0–34.0)
MCHC: 31.9 g/dL (ref 30.0–36.0)
MCV: 81.8 fL (ref 78.0–100.0)
Platelets: 303 10*3/uL (ref 150–400)
RBC: 4.72 MIL/uL (ref 3.87–5.11)
RDW: 14.2 % (ref 11.5–15.5)
WBC: 9.7 10*3/uL (ref 4.0–10.5)

## 2010-08-18 LAB — POCT CARDIAC MARKERS
CKMB, poc: <1 ng/mL — ABNORMAL LOW (ref 1.0–8.0)
Myoglobin, poc: 150 ng/mL (ref 12–200)
Troponin i, poc: <0.05 ng/mL (ref 0.00–0.09)

## 2010-08-18 LAB — APTT: aPTT: 45 seconds — ABNORMAL HIGH (ref 24–37)

## 2010-08-19 DIAGNOSIS — R55 Syncope and collapse: Secondary | ICD-10-CM

## 2010-08-19 LAB — CBC
HCT: 31.8 % — ABNORMAL LOW (ref 36.0–46.0)
Hemoglobin: 10.4 g/dL — ABNORMAL LOW (ref 12.0–15.0)
MCH: 26.7 pg (ref 26.0–34.0)
MCHC: 32.7 g/dL (ref 30.0–36.0)
MCV: 81.5 fL (ref 78.0–100.0)
Platelets: 235 10*3/uL (ref 150–400)
RBC: 3.9 MIL/uL (ref 3.87–5.11)
RDW: 14.1 % (ref 11.5–15.5)
WBC: 6.2 10*3/uL (ref 4.0–10.5)

## 2010-08-19 LAB — BASIC METABOLIC PANEL
BUN: 16 mg/dL (ref 6–23)
CO2: 26 mEq/L (ref 19–32)
Calcium: 8.7 mg/dL (ref 8.4–10.5)
Chloride: 107 mEq/L (ref 96–112)
Creatinine, Ser: 0.77 mg/dL (ref 0.4–1.2)
GFR calc Af Amer: >60 mL/min (ref >60)
GFR calc non Af Amer: >60 mL/min (ref >60)
Glucose, Bld: 107 mg/dL — ABNORMAL HIGH (ref 70–99)
Potassium: 3.4 mEq/L — ABNORMAL LOW (ref 3.5–5.1)
Sodium: 141 mEq/L (ref 135–145)

## 2010-08-19 LAB — IRON AND TIBC
Iron: 44 ug/dL (ref 42–135)
Saturation Ratios: 15 % — ABNORMAL LOW (ref 20–55)
TIBC: 299 ug/dL (ref 250–470)
UIBC: 255 ug/dL

## 2010-08-19 LAB — CARDIAC PANEL(CRET KIN+CKTOT+MB+TROPI)
CK, MB: 2.4 ng/mL (ref 0.3–4.0)
Relative Index: 2.3 (ref 0.0–2.5)
Total CK: 103 U/L (ref 7–177)
Troponin I: <0.3 ng/mL (ref <0.30)

## 2010-08-20 LAB — BASIC METABOLIC PANEL
BUN: 15 mg/dL (ref 6–23)
CO2: 25 mEq/L (ref 19–32)
Calcium: 8.7 mg/dL (ref 8.4–10.5)
Chloride: 105 mEq/L (ref 96–112)
Creatinine, Ser: 0.83 mg/dL (ref 0.4–1.2)
GFR calc Af Amer: >60 mL/min (ref >60)
GFR calc non Af Amer: >60 mL/min (ref >60)
Glucose, Bld: 110 mg/dL — ABNORMAL HIGH (ref 70–99)
Potassium: 3.8 mEq/L (ref 3.5–5.1)
Sodium: 138 mEq/L (ref 135–145)

## 2010-08-20 LAB — CBC
HCT: 32.9 % — ABNORMAL LOW (ref 36.0–46.0)
Hemoglobin: 10.9 g/dL — ABNORMAL LOW (ref 12.0–15.0)
MCH: 27.2 pg (ref 26.0–34.0)
MCHC: 33.1 g/dL (ref 30.0–36.0)
MCV: 82 fL (ref 78.0–100.0)
Platelets: 238 10*3/uL (ref 150–400)
RBC: 4.01 MIL/uL (ref 3.87–5.11)
RDW: 14.1 % (ref 11.5–15.5)
WBC: 6.4 10*3/uL (ref 4.0–10.5)

## 2010-08-20 LAB — VITAMIN B12: Vitamin B-12: 323 pg/mL (ref 211–911)

## 2010-08-20 LAB — FERRITIN: Ferritin: 78 ng/mL (ref 10–291)

## 2010-08-23 NOTE — H&P (Signed)
[name], [name]                ACCOUNT NO.:  [id number]  MEDICAL RECORD NO.:  [id number]           PATIENT TYPE:  I  LOCATION:  1427                         FACILITY:  [facility]  PHYSICIAN:  Gwen Pounds, MD       DATE OF BIRTH:  02/02/1934  DATE OF ADMISSION:  08/18/2010 DATE OF DISCHARGE:                             HISTORY & PHYSICAL   PRIMARY CARE PROVIDER:  Dr. Jarold Motto.  CARDIOLOGIST:  Georga Hacking, M.D.  UROLOGIST:  Excell Seltzer. Annabell Howells, M.D.  GASTROENTEROLOGIST:  Bernette Redbird, M.D.  CHIEF COMPLAINT:  Syncope and left eye laceration.  HISTORY OF PRESENT ILLNESS:  A 77 year old female with multiple medical problems, on Pradaxa for her paroxysmal Afib, who presented tonight after a syncopal event.  She reports tonight  that she was cooking, she had finished and put the oven on high heat for cleaning, it got very hot in the kitchen, and does not remember much except that she got cold and became uncomfortable and fell over.  She apparently fell to her tile floor on the left side of her body and hit the side of her face.  She broke her glasses and the rims of the glasses cut her left eyebrow and her left temple.  Again, she does not remember the precipitating cause of the event.  Her husband heard her fell and came rushing.  When he was in the room, she had already regained consciousness, so she was only out for a few seconds.  They decided to come to the emergency department. Her husband drove her over here.  She has gotten 4 stitches, tetanus shot was given.  There is no precipitating event that was noted.  No chest pain. No shortness of breath.  There is no evidence of seizure.  She did not bite her tongue.  She does not have evidence of seizure as there was no loss of bowel or bladder.  she has never lost consciousness before.  In the ED, cranial CT was negative, otherwise workup was negative, and she does have left hand hematoma.  I and Cardiology was called to  admit.  PAST MEDICAL HISTORY: 1. Recent vaginal bleeding. 2. History of cerumen impaction. 3. History of osteoarthritis and bilateral knee pain, left greater     than right. 4. History of hematuria. 5. History of left plantar wart. 6. Chronic dyspnea on exertion, especially with stairs, but able to do     a treadmill. 7. Chronic anticoagulation for her paroxysmal AFib. 8. GERD. 9. Hyperlipidemia. 10.Hypertension. 11.Irritable bowel syndrome. 12.Obstructive sleep apnea. 13.Osteopenia. 14.Multiple urinary tract infection. 15.History of prior TIA/CVA. 16.History of depression. 17.Chronic bilateral venous insufficiency, left greater than right     with edema. 18.Chronic low back pain. 19.History of C difficile colitis. 20.History of breast cancer in remission. 21.History of mastoiditis. 22.Deaf in the right ear.  She is status post surgery on that right     ear at the age of 76 years old and left her face with a chronic     facial droop and some paralysis. 23.Deviated septum. 24.History of right mastoidectomy. 25.History of knee  surgery. 26.History of cataract surgery, history of retinal detachment surgery. 27.In 1993, bilateral mastectomy for breast cancer. 28.Right knee arthroscopic in 1990. 29.Right knee total knee replacement in 2006.  FAMILY HISTORY:  Father deceased at 71 of stroke.  Mother deceased at 32 of stroke, otherwise coronary artery disease, breast cancer.  SOCIAL HISTORY:  She is married, has 2 children.  She is a retired Warden/ranger.  Nonsmoker, nondrinker.  Cousin is Dr. Cyndie Chime.  ALLERGIES: 1. CIPRO. 2. BONIVA. 3. NONSTEROIDAL ANTI-INFLAMMATORIES. 4. PENICILLIN.  Meds - see order sheet.  REVIEW OF SYSTEMS:  The patient with dyspnea on exertion with stairs, but can do 15 minutes of treadmill per day.  She has significant osteoarthritis of her knees.  She has got right facial paralysis, posterior ear surgery at 77 years old.  She has got some  nasal congestion.  She denies any chest pain or shortness of breath.  She denies any eye, ear, nose, and throat symptoms except for the some pain where she had the laceration.  She denies any throat-related issues. Denies any of pulmonary or cardiac issues.  Her chronic urinary symptoms are under control at the current time.  No abdominal symptoms except for intermittent diarrhea and constipation.  Recent vaginal bleeding has been worked up as an outpatient and no current neurologic symptoms that she is complaining of.  PHYSICAL EXAMINATION:  VITAL SIGNS:  Temperature 97.5; blood pressure 136/62 up to 155/78; heart rate anywhere between 53 and 65 recorded, but 40s on the monitor; saturating 96% room air. GENERAL:  Alert and oriented.  The left hand with significant bruising noted. HEENT:  Left eye has 2 lacerations.  She is bruised.  Oropharynx is clear.  Right face paralysis noted, but no change from her prior. PULMONARY:  Clear to auscultation bilaterally. CARDIAC:  Regular. ABDOMEN:  Soft, obese. EXTREMITIES:  Minimal edema and stable.  She is moving all 4s.  No neurologic deficits.  ANCILLARY DATA:  White count 9.7, hemoglobin 12.3, platelet count 303. Sodium 137, potassium 4.0, chloride 102, bicarb 26, BUN 19, creatinine 0.9, glucose 108, CK, troponin I  is negative.  EKG, normal sinus rhythm.  Urinalysis, negative.  Chest x-ray, no acute cardiopulmonary disease.  Cranial CT, remote left cerebellar infarct. CT of the cervical spine, no acute finding.  Left hand x-rays negative.  ASSESSMENT:  This is an elderly female with medical problems, on Pradaxa for her paroxysmal atrial fibrillation, currently at normal sinus rhythm, being admitted to Telemetry for syncope.  PLAN: 1. Again, admit to Telemetry. 2. We will get one more set of enzymes, although myocardial infarction     is very unlikely. 3. Repeat labs in the morning and followup on the CBC as she was     recently  anemic in the outpatient setting and had some bleeding, on     the Pradaxa and her current hemoglobin is better than expected. 4. May need repeat cranial CT if there are any neuro changes, although     I do not predict there to be any. 5. She is status post tetanus shot and appropriately done. 6. 2-D echo and carotids have been ordered. 7. Followup workup with Dr. Donnie Aho and Dr. Jarold Motto in the morning. 8. CPAP has been ordered. 9. Watch for further bradyarrhythmias and decrease Toprol at this     current time. 10.For chronic urinary tract infection, her current medications are     seemed to be working. 11.As far as her vaginal bleeding, there was a  CT scan sometime in     October 2011 that Dr. Matthias Hughs and Dr. Annabell Howells, both evaluated for the     possibility of a colovesical fistula, they both felt     that this was not likely the case.  As far as her functional     diarrhea was okay for her to continue on the Imodium as she has     been doing. 12.According to last evaluation per Dr. Donnie Aho that I have records     from October 2011.  She had a treadmill Cardiolite and     echocardiogram in June 2010 with an EF of 60% and at that time, she     had a Holter event monitor in June 2011 as well and was in normal     sinus rhythm at the last visit.  The plan at the time was to     continue Pradaxa indefinitely and follow after 1 year with Dr.     Donnie Aho. 13.Right facial paresis/palsy due to the prior surgery and is not new and does     not represent any possibility of stroke. 14.Local care to the left eye will be given. 15.Neuro checks have been ordered. 16.Saline treatments given in the emergency room with TDAP and Tylenol     and she is currently neurologically stable.     Gwen Pounds, MD     JMR/MEDQ  D:  08/18/2010  T:  08/19/2010  Job:  [id number]  cc:   Georga Hacking, M.D. Fax: [phone] Email: [email or web address]  Excell Seltzer. Annabell Howells, M.D. Fax: [phone]  Bernette Redbird, M.D. Fax: [phone]  Dr. Jarold Motto  Electronically Signed by Creola Corn MD on 08/23/2010 04:34:55 PM

## 2010-08-25 LAB — PROTEIN ELECTROPHORESIS, SERUM
Albumin ELP: 57.6 % (ref 55.8–66.1)
Alpha-1-Globulin: 4.3 % (ref 2.9–4.9)
Alpha-2-Globulin: 11.5 % (ref 7.1–11.8)
Beta 2: 4.8 % (ref 3.2–6.5)
Beta Globulin: 5.9 % (ref 4.7–7.2)
Gamma Globulin: 15.9 % (ref 11.1–18.8)
M-Spike, %: NOT DETECTED g/dL
Total Protein ELP: 6.4 g/dL (ref 6.0–8.3)

## 2010-08-25 NOTE — Discharge Summary (Signed)
[name], [name]                ACCOUNT NO.:  [id number]  MEDICAL RECORD NO.:  [id number]           PATIENT TYPE:  I  LOCATION:  1427                         FACILITY:  [facility]  PHYSICIAN:  Barry Dienes. Eloise Harman, M.D.DATE OF BIRTH:  11-25-1933  DATE OF ADMISSION:  08/18/2010 DATE OF DISCHARGE:  08/20/2010                              DISCHARGE SUMMARY   PERTINENT FINDINGS:  The patient is a 77 year old Caucasian woman with several medical problems, who is on Pradaxa for paroxysmal atrial fibrillation, who presented to the emergency room after an episode of syncope.  She reported that on the evening of admission she was cooking and had just put the oven on high heat for cleaning.  It was very hot in the kitchen and she remembered becoming suddenly cold and uncomfortable and then fell.  She hit her tile floor on the left side of her body and hit the side of her face.  She broke her glasses and had small lacerations on the left supraorbital region.  Her husband brought her to the emergency room where their workup included a head CT scan that was negative and EKG showing sinus rhythm.  She was admitted for further evaluation.  PAST MEDICAL HISTORY:  Significant for vaginal bleeding, osteoarthritis and bilateral knee pain (left greater than right), hematuria with workup in the past benign, chronic dyspnea on exertion, chronic anticoagulation with Pradaxa for paroxysmal atrial fibrillation, gastroesophageal reflux disease, hyperlipidemia, hypertension, irritable bowel syndrome, obstructive sleep apnea, osteopenia, frequent urinary tract infections, history of prior TIAs/CVA, history of depression, chronic bilateral venous insufficiency left greater than right, chronic low back pain, history of C. difficile colitis, history of breast cancer in remission, history of mastoiditis on the right side, history of deafness in the right ear and chronic right facial droop, following surgery at age  88 years, history of 1993 bilateral mastectomy for breast cancer.  See admission history and physical for details of family history, social history, allergies, and review of systems.  INITIAL PHYSICAL EXAMINATION:  VITAL SIGNS:  Blood pressure 136/62, pulse 53 to 65 and occasionally in the 40s on her monitor, temperature 97.5, pulse oxygen saturation 96% on room air. GENERAL:  The patient is an elderly white woman who was in no apparent distress. HEENT:  She had two small lacerations around the left eye that were sutured and had some periorbital hematoma.  She had right facial droop. CHEST:  Clear to auscultation. HEART:  Regular rate and rhythm without significant murmur or gallop. ABDOMEN:  Benign. EXTREMITIES:  Without edema. NEUROLOGIC:  She is alert and well-oriented with normal affect.  She had a right-sided facial droop but otherwise, no focal neurologic deficits.  INITIAL LABORATORY STUDIES:  White blood cell count 9.7, hemoglobin 12.3, platelets 303,000, serum sodium 137, potassium 4.0, chloride 102, bicarbonate 26, BUN 19, creatinine 0.9, glucose 108.  CK and troponin I were normal.  EKG showed normal sinus rhythm.  Urinalysis was normal. Chest x-ray showed no acute cardiopulmonary disease.  Head CT scan without IV contrast showed a remote left cerebellar infarct.  CT scan of the cervical spine showed no acute findings.  Left hand x-rays showed no evidence of fracture.  HOSPITAL COURSE:  The patient was admitted to a medical bed with telemetry.  On telemetry, she remained in a sinus rhythm and showed no evidence of significant arrhythmia.  She also had a carotid ultrasound exam that showed no ICA stenosis.  She had a transthoracic echocardiogram with results pending at the time of dictation.  She was seen by her cardiologist who felt that her oxybutynin could contribute to lightheadedness and recommended discontinuation.  He was planning on a postdischarge 30-day event  monitor study.  CONDITION ON DISCHARGE:  She feels fine.  She has mild soreness around her left eye and some contusion in that area.  She denies headache or nausea or change in her vision.  She has been eating well.  She has not had any palpitations or chest pain or shortness of breath.  MOST RECENT PHYSICAL EXAMINATION:  VITAL SIGNS:  Blood pressure 150/72, pulse 66, respirations 18, temperature 98.8, pulse oxygen saturation 97% on room air. GENERAL:  She is an elderly white woman who is in no apparent distress. HEAD, EYES, EARS, NOSE AND THROAT:  Significant for a left periorbital hematoma which was relatively small with preserved ability to open her eyes.  Extraocular movements were intact.  There are two small lacerations around the left eye that are clean, dry, intact, and held by sutures. NECK:  Supple without jugular venous distention or carotid bruit. CHEST:  Clear to auscultation. HEART:  Regular rate and rhythm. ABDOMEN:  Benign. NEUROLOGIC:  She is alert and well-oriented with a normal affect. Cranial nerves II through XII were significant for right facial droop. She had normal motor strength throughout.  She was able to change from a sitting position to standing position with minimal one-person assistance and able to walk in the room again with minimal one-person assistance and a wide-based gait because of osteoarthritis of her knees.  LABORATORY DATA:  A current telemetry shows normal sinus rhythm.  Serial cardiac enzymes were normal.  Other significant labs include serum sodium 138, potassium 3.8, chloride 105, carbon dioxide 25, BUN 15, creatinine 0.83, glucose 110, white blood cell count 6.4, hemoglobin 10.9, hematocrit 32.9, platelets 238,000, serum iron 44, total iron binding capacity 299 (15% saturation).  Serum B12 323, serum ferritin 78, serum protein electrophoresis results were pending at the time of dictation.  PROCEDURES:  Carotid ultrasound and  transthoracic echocardiogram and head CT scan without IV contrast.  COMPLICATIONS:  None.  DISCHARGE DIAGNOSES: 1. Syncope. 2. Paroxysmal atrial fibrillation. 3. Chronic anticoagulation, p.o. Pradaxa. 4. Anemia, iron deficiency. 5. Gait instability. 6. Hypokalemia, corrected. 7. Bilateral knee osteoarthritis. 8. Chronic dyspnea on exertion. 9. Gastroesophageal reflux disease. 10.Hyperlipidemia. 11.Hypertension. 12.Irritable bowel syndrome. 13.Obstructive sleep apnea. 14.Osteopenia. 15.Frequent urinary tract infection. 16.History of cerebellar stroke. 17.Depression. 18.Bilateral chronic venous insufficiency. 19.Chronic low back pain. 20.History of Clostridium difficile colitis. 21.History of breast cancer, in remission. 22.History of right-sided mastoiditis. 23.Chronic right facial droop. 24.Vitamin D deficiency.  DISCHARGE MEDICATIONS: 1. Tylenol 325 mg, take 2 tablets by mouth every 6 hours as needed for     pain. 2. Calcium carbonate with vitamin D 1 tablet p.o. t.i.d. with meals. 3. Estrace vaginal cream one application intravaginal every Wednesday     and Saturday. 4. Loperamide 2 mg take one-half tablet p.o. daily at bedtime. 5. Metoprolol tartrate 25 mg p.o. twice daily. 6. Nitrofurantoin 100 mg p.o. every evening. 7. Pantoprazole 40 mg p.o. twice daily. 8. Pradaxa 150 mg p.o. twice daily,  to be restarted on Friday, Aug 21, 2010. 9. Pravastatin 20 mg p.o. daily. 10.Senior Vites 1 tablet p.o. daily. 11.Vitamin D 50,000 units p.o. once weekly.  DISPOSITION AND FOLLOWUP:  Today, she will go to her cardiologist's office upon discharge to have an event monitor placed and then further followup per Dr. Donnie Aho.  She should have a followup evaluation with Dr. Jarome Matin in approximately 7 to 8 days following discharge and should call [phone] to set up that appointment.  She should use her cane to ambulate safely at all times.           ______________________________ Barry Dienes Eloise Harman, M.D.     DGP/MEDQ  D:  08/20/2010  T:  08/20/2010  Job:  [id number]  cc:   Georga Hacking, M.D. Fax: [phone] Email: [email or web address]  Excell Seltzer. Annabell Howells, M.D. Fax: [phone]  Bernette Redbird, M.D. Fax: [phone]  Electronically Signed by Jarome Matin M.D. on 08/25/2010 08:26:43 AM

## 2010-08-27 ENCOUNTER — Ambulatory Visit: Payer: Medicare Other | Attending: Obstetrics and Gynecology | Admitting: Physical Therapy

## 2010-08-27 DIAGNOSIS — M6281 Muscle weakness (generalized): Secondary | ICD-10-CM | POA: Insufficient documentation

## 2010-08-27 DIAGNOSIS — R269 Unspecified abnormalities of gait and mobility: Secondary | ICD-10-CM | POA: Insufficient documentation

## 2010-08-27 DIAGNOSIS — IMO0001 Reserved for inherently not codable concepts without codable children: Secondary | ICD-10-CM | POA: Insufficient documentation

## 2010-09-15 ENCOUNTER — Ambulatory Visit: Payer: Medicare Other | Attending: Obstetrics and Gynecology | Admitting: Physical Therapy

## 2010-09-15 DIAGNOSIS — M6281 Muscle weakness (generalized): Secondary | ICD-10-CM | POA: Insufficient documentation

## 2010-09-15 DIAGNOSIS — IMO0001 Reserved for inherently not codable concepts without codable children: Secondary | ICD-10-CM | POA: Insufficient documentation

## 2010-09-15 DIAGNOSIS — R269 Unspecified abnormalities of gait and mobility: Secondary | ICD-10-CM | POA: Insufficient documentation

## 2010-09-21 ENCOUNTER — Ambulatory Visit: Payer: Medicare Other | Admitting: Physical Therapy

## 2010-09-23 ENCOUNTER — Ambulatory Visit: Payer: Medicare Other | Admitting: Physical Therapy

## 2010-09-28 ENCOUNTER — Ambulatory Visit: Payer: Medicare Other | Attending: Obstetrics and Gynecology | Admitting: Physical Therapy

## 2010-09-28 DIAGNOSIS — M6281 Muscle weakness (generalized): Secondary | ICD-10-CM | POA: Insufficient documentation

## 2010-09-28 DIAGNOSIS — IMO0001 Reserved for inherently not codable concepts without codable children: Secondary | ICD-10-CM | POA: Insufficient documentation

## 2010-09-28 DIAGNOSIS — R269 Unspecified abnormalities of gait and mobility: Secondary | ICD-10-CM | POA: Insufficient documentation

## 2010-09-29 NOTE — Consult Note (Signed)
NAMEELIDE, STALZER                ACCOUNT NO.:  [id number]  MEDICAL RECORD NO.:  [id number]           PATIENT TYPE:  I  LOCATION:  1427                         FACILITY:  [facility]  PHYSICIAN:  Georga Hacking, M.D.DATE OF BIRTH:  Feb 17, 1934  DATE OF CONSULTATION:  08/19/2010                                 CONSULTATION   I was asked to see this very nice 77 year old female for evaluation of syncope.  The patient has a prior history of hypertension and a previous history of TIA several years ago.  She has breast cancer.  This has been treated previously with surgery.  She was diagnosed with atrial fibrillation, felt to be paroxysmal on a cardiac event monitor, and she was treated with a beta-blocker therapy with metoprolol 25 mg b.i.d. mg as well as Pradaxa for anticoagulation.  A previous echocardiogram that showed normal LV function.  She has had some arthritis, but has otherwise gotten along well, but has frequent urinary bladder symptoms and has recently been started on oxybutynin cream.  She was feeling in her normal state of health and had finished cooking and was preparing for a party and put the oven on high heat for cleaning as well as used some chemical cleaning.  She stated it became very hot in the kitchen.  She remembered not feeling well and became uncomfortable and had a syncopal episode.  She fell to the tiled floor hitting on the left side of her face breaking her glasses and cutting the rim of her left eyebrow.  She had a very brief episode of unconsciousness.  Her husband heard her fall and came into the room. She was transported here and an EKG on admission here showed a sinus rhythm at a rate of 62.  No definite bradycardia was noted, and she has been on a reduced dose of metoprolol since she was here.  A two-view chest x-ray showed clear lung fields.  CT scan of the head showed no disease.  Hand x-ray showed no abnormalities.  She has not had any chest pain,  shortness of breath, or other cardiac symptoms.  She has not had recurrent syncope since admission.  Her blood pressure has been well maintained.  Past history is remarkable for osteoarthritis, bilateral knee pain, hyperlipidemia, hypertension, irritable bowel syndrome, sleep apnea, but does not wear CPAP, breast cancer treated with surgery, and a prior history of a TIA and prior history of depression.  PAST SURGICAL HISTORY:  Left and right mastectomy and right knee replacement, mastoid bone removal, and tonsillectomy.  She has had previous cataract surgery and retinal detachment surgery.  FAMILY HISTORY:  Father died age 34 of a stroke.  Mother died at age 38 of a stroke.  SOCIAL HISTORY:  She is married with two children and is a retired Warden/ranger.  She is a nonsmoker and nondrinker.  She is a cousin of Dr. Cyndie Chime.  REVIEW OF SYSTEMS:  She has mild obesity.  She does wear glasses.  She has a history of cataract surgery and also history of retinal detachment.  She has been able to exercise regularly, but  does have some dyspnea on exertion, has significant osteoarthritis.  She has chronic urinary symptoms of bladder frequency and has been recently placed on oxybutynin about a month ago that she used topically 2 out of every 3 days.  Other than as noted above, the remainder of systems is unremarkable.  PHYSICAL EXAMINATION:  GENERAL:  She is a pleasant elderly female who is currently in no acute distress. VITAL SIGNS:  Blood pressure is 130/62, pulse is currently 60 and regular. SKIN:  Warm and dry. ENT:  EOMI.  PERRLA.  She has an ecchymoses noted on her left orbit and her left temple. LUNGS:  Clear to auscultation and percussion. CARDIAC:  Normal S1 and S2.  No S3, S4, or murmur. ABDOMEN:  Soft.  There is no edema.  Femoral pulse is 2+. NEUROLOGIC:  Normal.  A 12-lead EKG shows minor nonspecific ST and T-wave changes, sinus rhythm, rate 62.  Chest x-ray was clear.   Echo is pending at the time of dictation.  IMPRESSION: 1. Syncopal episode, etiology undetermined.  This could be arrhythmic;     however, there is a clear association between the syncopal episode     and significant exposure to heat while she was cleaning her oven.     Syncope has been reported in this setting with oxybutynin for     unclear mechanisms perhaps due to the fact that lack of      sweating can lead to hyperthermia. 2. Hypertension. 3. Chronic anticoagulation with Pradaxa. 4. Hyperlipidemia. 5. History of reflux. 6. Irritable bowel syndrome. 7. Obstructive sleep apnea. 8. History of bladder urgency and frequency and previous urinary tract     infections.  RECOMMENDATIONS:  At this point in time, I would check orthostatic blood pressures.  If she has no cardiac arrhythmias overnight, I think she could be discharged home in the morning, and she should come back to the office ago and get a 24-hour event monitor.  I would agree with reducing her metoprolol dose.  I will also might consider discontinuing the oxybutynin.  Thank you for asking me to see her with you.     Georga Hacking, M.D.     WST/MEDQ  D:  08/19/2010  T:  08/20/2010  Job:  [id number]  cc:   Barry Dienes. Eloise Harman, M.D.  Electronically Signed by Lacretia Nicks. Donnie Aho M.D. on 09/29/2010 05:00:27 PM

## 2010-10-01 ENCOUNTER — Ambulatory Visit: Payer: Medicare Other | Admitting: Physical Therapy

## 2010-10-09 ENCOUNTER — Ambulatory Visit: Payer: Medicare Other | Admitting: Physical Therapy

## 2010-10-19 ENCOUNTER — Ambulatory Visit: Payer: Medicare Other | Admitting: Physical Therapy

## 2010-10-22 ENCOUNTER — Ambulatory Visit: Payer: Medicare Other | Admitting: Physical Therapy

## 2010-10-26 ENCOUNTER — Ambulatory Visit: Payer: Medicare Other | Admitting: Physical Therapy

## 2010-10-30 ENCOUNTER — Ambulatory Visit: Payer: Medicare Other | Attending: Obstetrics and Gynecology | Admitting: Physical Therapy

## 2010-10-30 DIAGNOSIS — IMO0001 Reserved for inherently not codable concepts without codable children: Secondary | ICD-10-CM | POA: Insufficient documentation

## 2010-10-30 DIAGNOSIS — M6281 Muscle weakness (generalized): Secondary | ICD-10-CM | POA: Insufficient documentation

## 2010-10-30 DIAGNOSIS — R269 Unspecified abnormalities of gait and mobility: Secondary | ICD-10-CM | POA: Insufficient documentation

## 2010-11-02 ENCOUNTER — Ambulatory Visit: Payer: Medicare Other | Admitting: Physical Therapy

## 2010-11-09 ENCOUNTER — Ambulatory Visit: Payer: Medicare Other | Admitting: Physical Therapy

## 2010-11-12 ENCOUNTER — Ambulatory Visit: Payer: Medicare Other | Admitting: Physical Therapy

## 2010-11-16 ENCOUNTER — Ambulatory Visit: Payer: Medicare Other | Admitting: Physical Therapy

## 2010-11-18 ENCOUNTER — Ambulatory Visit: Payer: Medicare Other | Admitting: Physical Therapy

## 2010-11-19 ENCOUNTER — Ambulatory Visit: Payer: Medicare Other | Admitting: Physical Therapy

## 2010-11-20 ENCOUNTER — Ambulatory Visit: Payer: Medicare Other | Admitting: Physical Therapy

## 2010-11-23 ENCOUNTER — Ambulatory Visit: Payer: Medicare Other | Admitting: Physical Therapy

## 2010-11-27 ENCOUNTER — Ambulatory Visit: Payer: Medicare Other | Admitting: Physical Therapy

## 2010-12-04 ENCOUNTER — Ambulatory Visit: Payer: Medicare Other | Attending: Obstetrics and Gynecology | Admitting: Physical Therapy

## 2010-12-04 DIAGNOSIS — M6281 Muscle weakness (generalized): Secondary | ICD-10-CM | POA: Insufficient documentation

## 2010-12-04 DIAGNOSIS — R269 Unspecified abnormalities of gait and mobility: Secondary | ICD-10-CM | POA: Insufficient documentation

## 2010-12-04 DIAGNOSIS — IMO0001 Reserved for inherently not codable concepts without codable children: Secondary | ICD-10-CM | POA: Insufficient documentation

## 2010-12-10 ENCOUNTER — Ambulatory Visit: Payer: Medicare Other | Admitting: Physical Therapy

## 2010-12-15 ENCOUNTER — Ambulatory Visit: Payer: Medicare Other | Admitting: Physical Therapy

## 2011-07-18 ENCOUNTER — Other Ambulatory Visit: Payer: Self-pay | Admitting: Orthopedic Surgery

## 2011-07-18 MED ORDER — DEXAMETHASONE SODIUM PHOSPHATE 10 MG/ML IJ SOLN: 10.0000 mg | Freq: Once | INTRAMUSCULAR | Status: DC

## 2011-07-18 MED ORDER — BUPIVACAINE 0.25 % ON-Q PUMP SINGLE CATH 300ML: 300.0000 mL | INJECTION | Status: DC

## 2011-09-03 NOTE — H&P (Signed)
[name] DOB: Oct 29, 1933  Chief Complaint: left knee pain  History of Present Illness The patient is a 78 year old female who comes in today for a preoperative History and Physical. The patient is scheduled for a left total knee arthroplasty to be performed by Dr. Gus Rankin. Aluisio, MD at [facility] on Monday September 20, 2011 .She states the left knee hurts at most times. It is limiting what she can and can not do. It does swell on occasion. It gives out on occasion. She has had this going on for a long time but got much worse in the past three months. With regards to the right knee she has had it replaced. It still bothers her some. She has had cortisone in the left knee as well as visco supplements. The injections are no longer working. Due to failure of conservative measures, the most predictable means for increased function and decreased pain in the left knee is a left total knee arthroplasty. Risks and benefits of the surgery discussed. PCP: Dr. Jarold Motto Cardio: Dr. Donnie Aho Urology: Dr. 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 Age 78 Osteoarthritis Sleep Apnea Cardiac Arrhythmia . AtriaL Fib., controlled with oral meds. Irritable bowel syndrome Chronic Cystitis Cerebrovascular Accident. TIA 2003 Chronic Pain Hypertension Gastroesophageal Reflux Disease Breast Cancer. 1990s Anxiety Disorder Cataract. bilateral Pneumonia Osteopenia Impaired Hearing. 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 mother and father Cerebrovascular Accident. mother and father Heart disease in female family member before age 49 Heart disease in female family member before age 82 Osteoarthritis. mother Osteoporosis. mother Father.  deceased age 38 due to CVA Mother. 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 less than 1 Marital status. married Tobacco use. never smoker Pain Contract. no Tobacco / smoke exposure. no Exercise. Exercises weekly; does other and gym / weights Illicit drug use. no Living situation. live with spouse Current work status. retired Previously in rehab. no Children. 2 Drug/Alcohol Rehab (Currently). no Alcohol use. current drinker; drinks wine and hard liquor; only occasionally per week Post-Surgical Plans. SNF Advance Directives. living will   Medication History Warfarin Sodium ( Oral)  - Active. Nitrofurantoin (100MG  Capsule, Oral at bedtime) Active. Pantoprazole Sodium ( Oral)  - Active. Metoprolol Tartrate ( Oral)  - Active. Losartan Potassium ( Oral)  - Active. Chlorthalidone ( Oral)- Active. Multivitamins ( Oral) Active. D-Mannose ( Oral) Active. Cranberry Extract ( Oral)  - Active. Tylenol Extra Strength (500MG  Tablet, Oral) Active. Immodium ( at bedtime) Active. ZyrTEC Allergy (10MG  Capsule, Oral) Active.     Past Surgical History Mastoidectomy. left Tonsillectomy Total Knee Replacement. right 2006 Mastectomy. bilateral 1989 Breast Biopsy. bilateral Arthroscopy of Knee. right Breast Mass; Local Excision. bilateral Foot Surgery. left heel spur Cataract Surgery. bilateral    Review of Systems General:Not Present- Chills, Fever, Night Sweats, Fatigue, Weight Gain, Weight Loss and Memory Loss. Skin:Not Present- Hives, Itching, Rash, Eczema and Lesions. HEENT:Present- Hearing problems. Not Present- Tinnitus, Headache, Double Vision, Visual Loss, Hearing Loss and Dentures. Respiratory:Present- Allergies. Not Present- Shortness of breath with exertion, Shortness of breath at rest, Coughing up blood and Chronic  Cough. Cardiovascular:Present- Difficulty Breathing Lying Down. Not Present- Chest Pain, Racing/skipping heartbeats, Murmur, Swelling and Palpitations. Gastrointestinal:Present- Diarrhea. Not Present- Bloody  Stool, Heartburn, Abdominal Pain, Vomiting, Nausea, Constipation, Difficulty Swallowing, Jaundice and Loss of appetitie. Female Genitourinary:Present- Urinary frequency, Dysuria, Painful Urination and Urinating at Night. Not Present- Blood in Urine, Weak urinary stream, Discharge, Flank Pain, Incontinence, Urgency and Urinary Retention. Musculoskeletal:Present- Joint Pain, Morning Stiffness and Spasms. Not Present- Muscle Weakness, Muscle Pain, Joint Swelling and Back Pain. Neurological:Not Present- Tremor, Dizziness, Blackout spells, Paralysis, Difficulty with balance and Weakness. Psychiatric:Not Present- Insomnia.   Vitals Weight: 170 lb Height: 64 in Body Surface Area: 1.87 m Body Mass Index: 29.18 kg/m Pulse: 76 (Regular) Resp.: 18 (Unlabored) BP: 142/72 (Sitting, Left Arm, Standard)    Physical Exam General Mental Status - Alert, cooperative and good historian. General Appearance- pleasant. Not in acute distress. Orientation- Oriented X3. Build & Nutrition- Overweight, Well nourished and Well developed. Head and Neck Head- normocephalic, atraumatic . Neck Global Assessment- supple. no bruit auscultated on the right and no bruit auscultated on the left. Eye Pupil- Bilateral- Normal. Motion- Bilateral- EOMI. Chest and Lung Exam Auscultation: Breath sounds:- clear at anterior chest wall and - clear at posterior chest wall. Adventitious sounds:Inspiratory wheeze- Note: cleared after patient coughed Cardiovascular Auscultation:Rhythm- Regular rate and rhythm. Heart Sounds- S1 WNL and S2 WNL. Murmurs & Other Heart Sounds:Auscultation of the heart reveals - No Murmurs. Abdomen Palpation/Percussion:Tenderness- Abdomen is  non-tender to palpation. Rigidity (guarding)- Abdomen is soft. Auscultation:Auscultation of the abdomen reveals - Bowel sounds normal. Female Genitourinary Not done, not pertinent to present illness Peripheral Vascular Upper Extremity: Palpation:- Pulses bilaterally normal. Lower Extremity: Palpation:- Pulses bilaterally normal. Neurologic Examination of related systems reveals - normal muscle strength and tone in all extremities. Neurologic evaluation reveals - normal sensation and upper and lower extremity deep tendon reflexes intact bilaterally . Musculoskeletal The left knee has no effusion. Moderate crepitus on range of motion. Range about 5 to 120. She is tender medial greater than lateral with no instability. She does have an effusion. Right knee well healed incision. Range about 5 to 110, slight varus and valgus play with no AP laxity. No gross instability. She walks with an antalgic gait pattern.   RADIOGRAPHS: AP and lateral both knees show prosthesis on the right in good position, no definitive abnormalities. On the left she does have moderate to advanced medial and patellofemoral arthritis. It is bone on bone.    Assessment & Plan Osteoarthritis, Knee (715.96) Left total knee arthroplasty  Patient has lymphedema in the left arm and should NOT have blood pressure taken in that arm. Take BP ONLY in right arm during surgery and hospital stay.   Dimitri Ped, PA-C

## 2011-09-08 ENCOUNTER — Encounter (HOSPITAL_COMMUNITY): Payer: Self-pay | Admitting: Pharmacy Technician

## 2011-09-10 ENCOUNTER — Encounter (HOSPITAL_COMMUNITY)
Admission: RE | Admit: 2011-09-10 | Discharge: 2011-09-10 | Disposition: A | Discharge status: Home / Self Care | Payer: Medicare Other | Source: Ambulatory Visit | Attending: Orthopedic Surgery | Admitting: Orthopedic Surgery

## 2011-09-10 ENCOUNTER — Ambulatory Visit (HOSPITAL_COMMUNITY)
Admission: RE | Admit: 2011-09-10 | Discharge: 2011-09-10 | Disposition: A | Discharge status: Home / Self Care | Payer: Medicare Other | Source: Ambulatory Visit | Attending: Orthopedic Surgery | Admitting: Orthopedic Surgery

## 2011-09-10 ENCOUNTER — Encounter (HOSPITAL_COMMUNITY): Payer: Self-pay

## 2011-09-10 DIAGNOSIS — I4891 Unspecified atrial fibrillation: Secondary | ICD-10-CM | POA: Insufficient documentation

## 2011-09-10 DIAGNOSIS — Z01818 Encounter for other preprocedural examination: Secondary | ICD-10-CM | POA: Insufficient documentation

## 2011-09-10 DIAGNOSIS — I10 Essential (primary) hypertension: Secondary | ICD-10-CM | POA: Insufficient documentation

## 2011-09-10 DIAGNOSIS — J984 Other disorders of lung: Secondary | ICD-10-CM | POA: Insufficient documentation

## 2011-09-10 DIAGNOSIS — M1710 Unilateral primary osteoarthritis, unspecified knee: Secondary | ICD-10-CM | POA: Insufficient documentation

## 2011-09-10 DIAGNOSIS — Z01810 Encounter for preprocedural cardiovascular examination: Secondary | ICD-10-CM | POA: Insufficient documentation

## 2011-09-10 DIAGNOSIS — Z01812 Encounter for preprocedural laboratory examination: Secondary | ICD-10-CM | POA: Insufficient documentation

## 2011-09-10 DIAGNOSIS — I498 Other specified cardiac arrhythmias: Secondary | ICD-10-CM | POA: Insufficient documentation

## 2011-09-10 DIAGNOSIS — I700 Atherosclerosis of aorta: Secondary | ICD-10-CM | POA: Insufficient documentation

## 2011-09-10 HISTORY — DX: Unspecified macular degeneration: H35.30

## 2011-09-10 HISTORY — DX: Unspecified hearing loss, unspecified ear: H91.90

## 2011-09-10 HISTORY — DX: Urinary tract infection, site not specified: N39.0

## 2011-09-10 HISTORY — DX: Lymphedema, not elsewhere classified: I89.0

## 2011-09-10 HISTORY — DX: Cerebral infarction, unspecified: I63.9

## 2011-09-10 HISTORY — DX: Irritable bowel syndrome, unspecified: K58.9

## 2011-09-10 HISTORY — DX: Gastro-esophageal reflux disease without esophagitis: K21.9

## 2011-09-10 HISTORY — DX: Essential (primary) hypertension: I10

## 2011-09-10 HISTORY — DX: Reserved for inherently not codable concepts without codable children: IMO0001

## 2011-09-10 LAB — URINALYSIS, ROUTINE W REFLEX MICROSCOPIC
Bilirubin Urine: NEGATIVE
Glucose, UA: NEGATIVE mg/dL
Ketones, ur: NEGATIVE mg/dL
Leukocytes, UA: NEGATIVE
Nitrite: NEGATIVE
Protein, ur: NEGATIVE mg/dL
Specific Gravity, Urine: 1.011 (ref 1.005–1.030)
Urobilinogen, UA: 0.2 mg/dL (ref 0.0–1.0)
pH: 7 (ref 5.0–8.0)

## 2011-09-10 LAB — COMPREHENSIVE METABOLIC PANEL
ALT: 11 U/L (ref 0–35)
AST: 13 U/L (ref 0–37)
Albumin: 3.9 g/dL (ref 3.5–5.2)
Alkaline Phosphatase: 71 U/L (ref 39–117)
BUN: 18 mg/dL (ref 6–23)
CO2: 27 mEq/L (ref 19–32)
Calcium: 9.1 mg/dL (ref 8.4–10.5)
Chloride: 94 mEq/L — ABNORMAL LOW (ref 96–112)
Creatinine, Ser: 0.87 mg/dL (ref 0.50–1.10)
GFR calc Af Amer: 73 mL/min — ABNORMAL LOW (ref >90)
GFR calc non Af Amer: 63 mL/min — ABNORMAL LOW (ref >90)
Glucose, Bld: 103 mg/dL — ABNORMAL HIGH (ref 70–99)
Potassium: 3.6 mEq/L (ref 3.5–5.1)
Sodium: 131 mEq/L — ABNORMAL LOW (ref 135–145)
Total Bilirubin: 0.3 mg/dL (ref 0.3–1.2)
Total Protein: 7.2 g/dL (ref 6.0–8.3)

## 2011-09-10 LAB — APTT: aPTT: 58 seconds — ABNORMAL HIGH (ref 24–37)

## 2011-09-10 LAB — CBC
HCT: 31.7 % — ABNORMAL LOW (ref 36.0–46.0)
Hemoglobin: 10.4 g/dL — ABNORMAL LOW (ref 12.0–15.0)
MCH: 27.7 pg (ref 26.0–34.0)
MCHC: 32.8 g/dL (ref 30.0–36.0)
MCV: 84.5 fL (ref 78.0–100.0)
Platelets: 362 10*3/uL (ref 150–400)
RBC: 3.75 MIL/uL — ABNORMAL LOW (ref 3.87–5.11)
RDW: 13.8 % (ref 11.5–15.5)
WBC: 5 10*3/uL (ref 4.0–10.5)

## 2011-09-10 LAB — URINE MICROSCOPIC-ADD ON

## 2011-09-10 LAB — SURGICAL PCR SCREEN
MRSA, PCR: INVALID — AB
Staphylococcus aureus: INVALID — AB

## 2011-09-10 LAB — PROTIME-INR
INR: 3.31 — ABNORMAL HIGH (ref 0.00–1.49)
Prothrombin Time: 34.1 seconds — ABNORMAL HIGH (ref 11.6–15.2)

## 2011-09-10 NOTE — Patient Instructions (Signed)
YOUR SURGERY IS SCHEDULED ON:  Monday  6/24  AT 7:15 AM  REPORT TO [facility] AT:  5:15 AM      PHONE # FOR SHORT STAY IS [phone]  DO NOT EAT OR DRINK ANYTHING AFTER MIDNIGHT THE NIGHT BEFORE YOUR SURGERY.  YOU MAY BRUSH YOUR TEETH, RINSE OUT YOUR MOUTH--BUT NO WATER, NO FOOD, NO CHEWING GUM, NO MINTS, NO CANDIES, NO CHEWING TOBACCO.  PLEASE TAKE THE FOLLOWING MEDICATIONS THE AM OF YOUR SURGERY WITH A FEW SIPS OF WATER:  METOPROLOL AND PANTOPRAZOLE    IF YOU USE INHALERS--USE YOUR INHALERS THE AM OF YOUR SURGERY AND BRING INHALERS TO THE HOSPITAL -TAKE TO SURGERY.    IF YOU ARE DIABETIC:  DO NOT TAKE ANY DIABETIC MEDICATIONS THE AM OF YOUR SURGERY.  IF YOU TAKE INSULIN IN THE EVENINGS--PLEASE ONLY TAKE 1/2 NORMAL EVENING DOSE THE NIGHT BEFORE YOUR SURGERY.  NO INSULIN THE AM OF YOUR SURGERY.  IF YOU HAVE SLEEP APNEA AND USE CPAP OR BIPAP--PLEASE BRING THE MASK --NOT THE MACHINE-NOT THE TUBING   -JUST THE MASK. DO NOT BRING VALUABLES, MONEY, CREDIT CARDS.  CONTACT LENS, DENTURES / PARTIALS, GLASSES SHOULD NOT BE WORN TO SURGERY AND IN MOST CASES-HEARING AIDS WILL NEED TO BE REMOVED.  BRING YOUR GLASSES CASE, ANY EQUIPMENT NEEDED FOR YOUR CONTACT LENS. FOR PATIENTS ADMITTED TO THE HOSPITAL--CHECK OUT TIME THE DAY OF DISCHARGE IS 11:00 AM.  ALL INPATIENT ROOMS ARE PRIVATE - WITH BATHROOM, TELEPHONE, TELEVISION AND WIFI INTERNET. IF YOU ARE BEING DISCHARGED THE SAME DAY OF YOUR SURGERY--YOU CAN NOT DRIVE YOURSELF HOME--AND SHOULD NOT GO HOME ALONE BY TAXI OR BUS.  NO DRIVING OR OPERATING MACHINERY FOR 24 HOURS FOLLOWING ANESTHESIA / PAIN MEDICATIONS.                            SPECIAL INSTRUCTIONS:  CHLORHEXIDINE SOAP SHOWER (other brand names are Betasept and Hibiclens ) PLEASE SHOWER WITH CHLORHEXIDINE THE NIGHT BEFORE YOUR SURGERY AND THE AM OF YOUR SURGERY. DO NOT USE CHLORHEXIDINE ON YOUR FACE OR PRIVATE AREAS--YOU MAY USE YOUR NORMAL SOAP THOSE AREAS AND YOUR NORMAL  SHAMPOO.  WOMEN SHOULD AVOID SHAVING UNDER ARMS AND SHAVING LEGS 48 HOURS BEFORE USING CHLORHEXIDINE TO AVOID SKIN IRRITATION.  DO NOT USE IF ALLERGIC TO CHLORHEXIDINE.  PLEASE READ OVER ANY  FACT SHEETS THAT YOU WERE GIVEN: MRSA INFORMATION, BLOOD TRANSFUSION INFORMATION, INCENTIVE SPIROMETER INFORMATION.

## 2011-09-10 NOTE — Pre-Procedure Instructions (Signed)
CBC, CMET, PT,PTT, UA, EKG AND CXR WERE DONE TODAY AT [facility] AS PER ORDERS DR. Lequita Halt AND ANESTHESIOLOGIST'S GUIDELINES.  T/S WILL BE DONE DAY OF SURGERY. PT HAS NOTE OF MEDICAL CLEARANCE FROM DR. D. PATERSON AND CARDIAC CLEARANCE FROM DR. TILLEY. PREOP TEACHING DISCUSSED WITH PT USING TEACH BACK METHOD.

## 2011-09-13 LAB — MRSA CULTURE

## 2011-09-13 NOTE — Pre-Procedure Instructions (Signed)
PT'S PREOP ABNORMAL  PT, INR AND PTT REPORTS WERE FAXED TO DR. Deri Fuelling OFFICE-WITH NOTE THAT PT STILL ON COUMADIN WHEN THE BLOOD WAS DRAWN AND PT STOPPING COUMADIN ON 6/19 FOR SURGERY. PT'S PREOP EKG WAS FAXED TO DR. TILLEY'S OFFICE TODAY--AS PT STATED SHE HAS APPOINTMENT TO SEE HIM Tuesday  09/14/11.

## 2011-09-15 NOTE — Pre-Procedure Instructions (Signed)
FAXED NOTE RECEIVED FROM DR. Lequita Halt -ABNORMAL PT, INR,PTT REVIEWED -REPEAT PT, INR DAY OF SURGERY--ORDER IS IN EPIC.

## 2011-09-16 NOTE — Pre-Procedure Instructions (Signed)
PT'S CARDIOLOGY OFFICE NOTE 09/14/11 WITH CLEARANCE FOR KNEE REPLACEMENT IS ON PT'S CHART FROM DR. TILLEY.

## 2011-09-20 ENCOUNTER — Ambulatory Visit (HOSPITAL_COMMUNITY): Payer: Medicare Other | Admitting: Certified Registered Nurse Anesthetist

## 2011-09-20 ENCOUNTER — Inpatient Hospital Stay (HOSPITAL_COMMUNITY)
Admission: RE | Admit: 2011-09-20 | Discharge: 2011-09-23 | DRG: 470 | Disposition: A | Discharge status: Skilled Nursing Facility | Payer: Medicare Other | Source: Ambulatory Visit | Attending: Orthopedic Surgery | Admitting: Orthopedic Surgery

## 2011-09-20 ENCOUNTER — Encounter (HOSPITAL_COMMUNITY): Payer: Self-pay | Admitting: *Deleted

## 2011-09-20 ENCOUNTER — Encounter (HOSPITAL_COMMUNITY)
Admission: RE | Disposition: A | Discharge status: Skilled Nursing Facility | Payer: Self-pay | Source: Ambulatory Visit | Attending: Orthopedic Surgery

## 2011-09-20 ENCOUNTER — Encounter (HOSPITAL_COMMUNITY): Payer: Self-pay | Admitting: Certified Registered Nurse Anesthetist

## 2011-09-20 DIAGNOSIS — M179 Osteoarthritis of knee, unspecified: Secondary | ICD-10-CM | POA: Diagnosis present

## 2011-09-20 DIAGNOSIS — I10 Essential (primary) hypertension: Secondary | ICD-10-CM | POA: Diagnosis present

## 2011-09-20 DIAGNOSIS — Z7901 Long term (current) use of anticoagulants: Secondary | ICD-10-CM

## 2011-09-20 DIAGNOSIS — R339 Retention of urine, unspecified: Secondary | ICD-10-CM | POA: Diagnosis not present

## 2011-09-20 DIAGNOSIS — G4730 Sleep apnea, unspecified: Secondary | ICD-10-CM | POA: Diagnosis present

## 2011-09-20 DIAGNOSIS — M5137 Other intervertebral disc degeneration, lumbosacral region: Secondary | ICD-10-CM | POA: Diagnosis present

## 2011-09-20 DIAGNOSIS — F411 Generalized anxiety disorder: Secondary | ICD-10-CM | POA: Diagnosis present

## 2011-09-20 DIAGNOSIS — I4891 Unspecified atrial fibrillation: Secondary | ICD-10-CM | POA: Diagnosis present

## 2011-09-20 DIAGNOSIS — Z96659 Presence of unspecified artificial knee joint: Secondary | ICD-10-CM

## 2011-09-20 DIAGNOSIS — Z8673 Personal history of transient ischemic attack (TIA), and cerebral infarction without residual deficits: Secondary | ICD-10-CM

## 2011-09-20 DIAGNOSIS — M51379 Other intervertebral disc degeneration, lumbosacral region without mention of lumbar back pain or lower extremity pain: Secondary | ICD-10-CM | POA: Diagnosis present

## 2011-09-20 DIAGNOSIS — Z79899 Other long term (current) drug therapy: Secondary | ICD-10-CM

## 2011-09-20 DIAGNOSIS — E871 Hypo-osmolality and hyponatremia: Secondary | ICD-10-CM

## 2011-09-20 DIAGNOSIS — Z9289 Personal history of other medical treatment: Secondary | ICD-10-CM

## 2011-09-20 DIAGNOSIS — I890 Lymphedema, not elsewhere classified: Secondary | ICD-10-CM | POA: Diagnosis present

## 2011-09-20 DIAGNOSIS — E876 Hypokalemia: Secondary | ICD-10-CM | POA: Diagnosis not present

## 2011-09-20 DIAGNOSIS — D62 Acute posthemorrhagic anemia: Secondary | ICD-10-CM | POA: Diagnosis not present

## 2011-09-20 DIAGNOSIS — H3530 Unspecified macular degeneration: Secondary | ICD-10-CM | POA: Diagnosis present

## 2011-09-20 DIAGNOSIS — K219 Gastro-esophageal reflux disease without esophagitis: Secondary | ICD-10-CM | POA: Diagnosis present

## 2011-09-20 DIAGNOSIS — M1710 Unilateral primary osteoarthritis, unspecified knee: Principal | ICD-10-CM | POA: Diagnosis present

## 2011-09-20 HISTORY — PX: TOTAL KNEE ARTHROPLASTY: SHX125

## 2011-09-20 LAB — ABO/RH: ABO/RH(D): A POS

## 2011-09-20 LAB — PROTIME-INR
INR: 1.2 (ref 0.00–1.49)
Prothrombin Time: 15.5 seconds — ABNORMAL HIGH (ref 11.6–15.2)

## 2011-09-20 SURGERY — ARTHROPLASTY, KNEE, TOTAL
Anesthesia: General | Site: Knee | Laterality: Left | Wound class: Clean

## 2011-09-20 MED ORDER — OXYCODONE HCL 5 MG PO TABS
5.0000 mg | ORAL_TABLET | ORAL | Status: DC | PRN
  Administered 2011-09-22: 10 mg via ORAL
  Filled 2011-09-20: qty 2

## 2011-09-20 MED ORDER — CHLORTHALIDONE 25 MG PO TABS
25.0000 mg | ORAL_TABLET | Freq: Every day | ORAL | Status: DC
  Administered 2011-09-20 – 2011-09-23 (×4): 25 mg via ORAL
  Filled 2011-09-20 (×4): qty 1

## 2011-09-20 MED ORDER — BISACODYL 10 MG RE SUPP: 10.0000 mg | Freq: Every day | RECTAL | Status: DC | PRN

## 2011-09-20 MED ORDER — TRAMADOL HCL 50 MG PO TABS: 50.0000 mg | ORAL_TABLET | Freq: Four times a day (QID) | ORAL | Status: DC | PRN

## 2011-09-20 MED ORDER — ACETAMINOPHEN 10 MG/ML IV SOLN
1000.0000 mg | Freq: Once | INTRAVENOUS | Status: DC
  Filled 2011-09-20: qty 100

## 2011-09-20 MED ORDER — FLEET ENEMA 7-19 GM/118ML RE ENEM: 1.0000 | ENEMA | Freq: Once | RECTAL | Status: AC | PRN

## 2011-09-20 MED ORDER — METHOCARBAMOL 500 MG PO TABS
500.0000 mg | ORAL_TABLET | Freq: Four times a day (QID) | ORAL | Status: DC | PRN
  Administered 2011-09-22 (×2): 500 mg via ORAL
  Filled 2011-09-20 (×2): qty 1

## 2011-09-20 MED ORDER — ONDANSETRON HCL 4 MG PO TABS: 4.0000 mg | ORAL_TABLET | Freq: Four times a day (QID) | ORAL | Status: DC | PRN

## 2011-09-20 MED ORDER — DIPHENHYDRAMINE HCL 50 MG/ML IJ SOLN: 12.5000 mg | Freq: Four times a day (QID) | INTRAMUSCULAR | Status: DC | PRN

## 2011-09-20 MED ORDER — ACETAMINOPHEN 10 MG/ML IV SOLN
INTRAVENOUS | Status: AC
  Filled 2011-09-20: qty 100

## 2011-09-20 MED ORDER — ONDANSETRON HCL 4 MG/2ML IJ SOLN
INTRAMUSCULAR | Status: DC | PRN
  Administered 2011-09-20: 4 mg via INTRAVENOUS

## 2011-09-20 MED ORDER — GLYCOPYRROLATE 0.2 MG/ML IJ SOLN
INTRAMUSCULAR | Status: DC | PRN
  Administered 2011-09-20: 0.6 mg via INTRAVENOUS

## 2011-09-20 MED ORDER — ROCURONIUM BROMIDE 100 MG/10ML IV SOLN
INTRAVENOUS | Status: DC | PRN
  Administered 2011-09-20: 30 mg via INTRAVENOUS

## 2011-09-20 MED ORDER — ONDANSETRON HCL 4 MG/2ML IJ SOLN: 4.0000 mg | Freq: Four times a day (QID) | INTRAMUSCULAR | Status: DC | PRN

## 2011-09-20 MED ORDER — NEOSTIGMINE METHYLSULFATE 1 MG/ML IJ SOLN
INTRAMUSCULAR | Status: DC | PRN
  Administered 2011-09-20: 4 mg via INTRAVENOUS

## 2011-09-20 MED ORDER — HYDROMORPHONE HCL PF 1 MG/ML IJ SOLN
INTRAMUSCULAR | Status: DC | PRN
  Administered 2011-09-20: 1 mg via INTRAVENOUS
  Administered 2011-09-20 (×2): 0.5 mg via INTRAVENOUS

## 2011-09-20 MED ORDER — LOSARTAN POTASSIUM 50 MG PO TABS
100.0000 mg | ORAL_TABLET | Freq: Every day | ORAL | Status: DC
  Filled 2011-09-20: qty 2

## 2011-09-20 MED ORDER — DIPHENHYDRAMINE HCL 12.5 MG/5ML PO ELIX
12.5000 mg | ORAL_SOLUTION | Freq: Four times a day (QID) | ORAL | Status: DC | PRN
  Administered 2011-09-21: 12.5 mg via ORAL

## 2011-09-20 MED ORDER — DEXTROSE-NACL 5-0.9 % IV SOLN
INTRAVENOUS | Status: DC
  Administered 2011-09-20 – 2011-09-21 (×2): via INTRAVENOUS

## 2011-09-20 MED ORDER — LACTATED RINGERS IV SOLN
INTRAVENOUS | Status: DC | PRN
  Administered 2011-09-20: 07:00:00 via INTRAVENOUS

## 2011-09-20 MED ORDER — CEFAZOLIN SODIUM 1-5 GM-% IV SOLN
1.0000 g | Freq: Four times a day (QID) | INTRAVENOUS | Status: AC
  Administered 2011-09-20 (×2): 1 g via INTRAVENOUS
  Filled 2011-09-20 (×3): qty 50

## 2011-09-20 MED ORDER — PROMETHAZINE HCL 25 MG/ML IJ SOLN: 6.2500 mg | INTRAMUSCULAR | Status: DC | PRN

## 2011-09-20 MED ORDER — PHENOL 1.4 % MT LIQD: 1.0000 | OROMUCOSAL | Status: DC | PRN

## 2011-09-20 MED ORDER — METOCLOPRAMIDE HCL 5 MG/ML IJ SOLN: 5.0000 mg | Freq: Three times a day (TID) | INTRAMUSCULAR | Status: DC | PRN

## 2011-09-20 MED ORDER — LOSARTAN POTASSIUM 50 MG PO TABS
100.0000 mg | ORAL_TABLET | Freq: Every day | ORAL | Status: DC
  Administered 2011-09-21 – 2011-09-23 (×3): 100 mg via ORAL
  Filled 2011-09-20 (×3): qty 2

## 2011-09-20 MED ORDER — CEFAZOLIN SODIUM-DEXTROSE 2-3 GM-% IV SOLR
2.0000 g | INTRAVENOUS | Status: AC
  Administered 2011-09-20: 1 g via INTRAVENOUS

## 2011-09-20 MED ORDER — SUCCINYLCHOLINE CHLORIDE 20 MG/ML IJ SOLN
INTRAMUSCULAR | Status: DC | PRN
  Administered 2011-09-20: 100 mg via INTRAVENOUS

## 2011-09-20 MED ORDER — WARFARIN - PHARMACIST DOSING INPATIENT: Freq: Every day | Status: DC

## 2011-09-20 MED ORDER — MORPHINE SULFATE (PF) 1 MG/ML IV SOLN
INTRAVENOUS | Status: AC
  Filled 2011-09-20: qty 25

## 2011-09-20 MED ORDER — HYDROMORPHONE HCL PF 1 MG/ML IJ SOLN
0.2500 mg | INTRAMUSCULAR | Status: DC | PRN
  Administered 2011-09-20 (×4): 0.5 mg via INTRAVENOUS

## 2011-09-20 MED ORDER — LOPERAMIDE HCL 2 MG PO CAPS: 2.0000 mg | ORAL_CAPSULE | ORAL | Status: DC | PRN

## 2011-09-20 MED ORDER — LIDOCAINE HCL (CARDIAC) 20 MG/ML IV SOLN
INTRAVENOUS | Status: DC | PRN
  Administered 2011-09-20: 70 mg via INTRAVENOUS

## 2011-09-20 MED ORDER — FENTANYL CITRATE 0.05 MG/ML IJ SOLN
INTRAMUSCULAR | Status: DC | PRN
  Administered 2011-09-20 (×3): 50 ug via INTRAVENOUS
  Administered 2011-09-20: 100 ug via INTRAVENOUS

## 2011-09-20 MED ORDER — ACETAMINOPHEN 650 MG RE SUPP: 650.0000 mg | Freq: Four times a day (QID) | RECTAL | Status: DC | PRN

## 2011-09-20 MED ORDER — 0.9 % SODIUM CHLORIDE (POUR BTL) OPTIME
TOPICAL | Status: DC | PRN
  Administered 2011-09-20: 1000 mL

## 2011-09-20 MED ORDER — MENTHOL 3 MG MT LOZG: 1.0000 | LOZENGE | OROMUCOSAL | Status: DC | PRN

## 2011-09-20 MED ORDER — WARFARIN SODIUM 7.5 MG PO TABS
7.5000 mg | ORAL_TABLET | Freq: Once | ORAL | Status: AC
  Administered 2011-09-20: 7.5 mg via ORAL
  Filled 2011-09-20: qty 1

## 2011-09-20 MED ORDER — LABETALOL HCL 5 MG/ML IV SOLN
INTRAVENOUS | Status: DC | PRN
  Administered 2011-09-20 (×2): 2.5 mg via INTRAVENOUS

## 2011-09-20 MED ORDER — BUPIVACAINE 0.25 % ON-Q PUMP SINGLE CATH 300ML
INJECTION | Status: AC
  Filled 2011-09-20: qty 300

## 2011-09-20 MED ORDER — METHOCARBAMOL 100 MG/ML IJ SOLN
500.0000 mg | Freq: Four times a day (QID) | INTRAVENOUS | Status: DC | PRN
  Administered 2011-09-20 – 2011-09-22 (×2): 500 mg via INTRAVENOUS
  Filled 2011-09-20 (×2): qty 5

## 2011-09-20 MED ORDER — DIPHENHYDRAMINE HCL 12.5 MG/5ML PO ELIX
12.5000 mg | ORAL_SOLUTION | ORAL | Status: DC | PRN
  Filled 2011-09-20: qty 5

## 2011-09-20 MED ORDER — CEFAZOLIN SODIUM-DEXTROSE 2-3 GM-% IV SOLR
INTRAVENOUS | Status: AC
  Filled 2011-09-20: qty 50

## 2011-09-20 MED ORDER — PANTOPRAZOLE SODIUM 40 MG PO TBEC
40.0000 mg | DELAYED_RELEASE_TABLET | Freq: Two times a day (BID) | ORAL | Status: DC
  Administered 2011-09-20 – 2011-09-23 (×7): 40 mg via ORAL
  Filled 2011-09-20 (×9): qty 1

## 2011-09-20 MED ORDER — SODIUM CHLORIDE 0.9 % IR SOLN
Status: DC | PRN
  Administered 2011-09-20: 1000 mL

## 2011-09-20 MED ORDER — MORPHINE SULFATE (PF) 1 MG/ML IV SOLN
INTRAVENOUS | Status: DC
  Administered 2011-09-20: 5 mg via INTRAVENOUS
  Administered 2011-09-20: 4 mg via INTRAVENOUS
  Administered 2011-09-20: 09:00:00 via INTRAVENOUS
  Administered 2011-09-21: 3 mg via INTRAVENOUS
  Administered 2011-09-21: 6 mg via INTRAVENOUS

## 2011-09-20 MED ORDER — ACETAMINOPHEN 325 MG PO TABS: 650.0000 mg | ORAL_TABLET | Freq: Four times a day (QID) | ORAL | Status: DC | PRN

## 2011-09-20 MED ORDER — CHLORHEXIDINE GLUCONATE 4 % EX LIQD
60.0000 mL | Freq: Once | CUTANEOUS | Status: DC
Start: 2011-09-20 — End: 2011-09-20
  Filled 2011-09-20: qty 60

## 2011-09-20 MED ORDER — MIDAZOLAM HCL 5 MG/5ML IJ SOLN
INTRAMUSCULAR | Status: DC | PRN
  Administered 2011-09-20: 1 mg via INTRAVENOUS

## 2011-09-20 MED ORDER — SODIUM CHLORIDE 0.9 % IJ SOLN: 9.0000 mL | INTRAMUSCULAR | Status: DC | PRN

## 2011-09-20 MED ORDER — METOPROLOL SUCCINATE ER 25 MG PO TB24
25.0000 mg | ORAL_TABLET | Freq: Two times a day (BID) | ORAL | Status: DC
  Administered 2011-09-20 – 2011-09-23 (×6): 25 mg via ORAL
  Filled 2011-09-20 (×7): qty 1

## 2011-09-20 MED ORDER — POLYETHYLENE GLYCOL 3350 17 G PO PACK
17.0000 g | PACK | Freq: Every day | ORAL | Status: DC | PRN
  Filled 2011-09-20: qty 1

## 2011-09-20 MED ORDER — NALOXONE HCL 0.4 MG/ML IJ SOLN: 0.4000 mg | INTRAMUSCULAR | Status: DC | PRN

## 2011-09-20 MED ORDER — NITROFURANTOIN MACROCRYSTAL 100 MG PO CAPS
100.0000 mg | ORAL_CAPSULE | Freq: Every day | ORAL | Status: DC
  Administered 2011-09-20 – 2011-09-22 (×3): 100 mg via ORAL
  Filled 2011-09-20 (×4): qty 1

## 2011-09-20 MED ORDER — HYDROMORPHONE HCL PF 1 MG/ML IJ SOLN
INTRAMUSCULAR | Status: AC
  Filled 2011-09-20: qty 2

## 2011-09-20 MED ORDER — EPHEDRINE SULFATE 50 MG/ML IJ SOLN
INTRAMUSCULAR | Status: DC | PRN
  Administered 2011-09-20: 10 mg via INTRAVENOUS

## 2011-09-20 MED ORDER — DEXAMETHASONE SODIUM PHOSPHATE 10 MG/ML IJ SOLN
INTRAMUSCULAR | Status: DC | PRN
  Administered 2011-09-20: 10 mg via INTRAVENOUS

## 2011-09-20 MED ORDER — METOCLOPRAMIDE HCL 10 MG PO TABS: 5.0000 mg | ORAL_TABLET | Freq: Three times a day (TID) | ORAL | Status: DC | PRN

## 2011-09-20 MED ORDER — PROPOFOL 10 MG/ML IV EMUL
INTRAVENOUS | Status: DC | PRN
  Administered 2011-09-20: 150 mg via INTRAVENOUS

## 2011-09-20 MED ORDER — ACETAMINOPHEN 10 MG/ML IV SOLN
1000.0000 mg | Freq: Four times a day (QID) | INTRAVENOUS | Status: AC
  Administered 2011-09-20 – 2011-09-21 (×3): 1000 mg via INTRAVENOUS
  Filled 2011-09-20 (×5): qty 100

## 2011-09-20 MED ORDER — BUPIVACAINE 0.25 % ON-Q PUMP SINGLE CATH 300ML
INJECTION | Status: DC | PRN
  Administered 2011-09-20: 300 mL

## 2011-09-20 MED ORDER — ENOXAPARIN SODIUM 30 MG/0.3ML ~~LOC~~ SOLN
30.0000 mg | Freq: Two times a day (BID) | SUBCUTANEOUS | Status: DC
  Administered 2011-09-21 – 2011-09-23 (×4): 30 mg via SUBCUTANEOUS
  Filled 2011-09-20 (×7): qty 0.3

## 2011-09-20 MED ORDER — DOCUSATE SODIUM 100 MG PO CAPS
100.0000 mg | ORAL_CAPSULE | Freq: Two times a day (BID) | ORAL | Status: DC
  Administered 2011-09-20 – 2011-09-23 (×7): 100 mg via ORAL
  Filled 2011-09-20 (×8): qty 1

## 2011-09-20 MED ORDER — ACETAMINOPHEN 10 MG/ML IV SOLN
INTRAVENOUS | Status: DC | PRN
  Administered 2011-09-20: 1000 mg via INTRAVENOUS

## 2011-09-20 MED ORDER — SODIUM CHLORIDE 0.9 % IV SOLN: INTRAVENOUS | Status: DC

## 2011-09-20 MED ORDER — BUPIVACAINE ON-Q PAIN PUMP (FOR ORDER SET NO CHG)
INJECTION | Status: DC
  Filled 2011-09-20: qty 1

## 2011-09-20 SURGICAL SUPPLY — 55 items
BAG SPEC THK2 15X12 ZIP CLS (MISCELLANEOUS) ×1
BAG ZIPLOCK 12X15 (MISCELLANEOUS) ×2 IMPLANT
BANDAGE ELASTIC 6 VELCRO ST LF (GAUZE/BANDAGES/DRESSINGS) ×2 IMPLANT
BANDAGE ESMARK 6X9 LF (GAUZE/BANDAGES/DRESSINGS) ×1 IMPLANT
BLADE SAG 18X100X1.27 (BLADE) ×2 IMPLANT
BLADE SAW SGTL 11.0X1.19X90.0M (BLADE) ×2 IMPLANT
BNDG CMPR 9X6 STRL LF SNTH (GAUZE/BANDAGES/DRESSINGS) ×1
BNDG ESMARK 6X9 LF (GAUZE/BANDAGES/DRESSINGS) ×2
BOWL SMART MIX CTS (DISPOSABLE) ×2 IMPLANT
CATH KIT ON-Q SILVERSOAK 5 (CATHETERS) ×1 IMPLANT
CATH KIT ON-Q SILVERSOAK 5IN (CATHETERS) ×2 IMPLANT
CEMENT HV SMART SET (Cement) ×4 IMPLANT
CLOTH BEACON ORANGE TIMEOUT ST (SAFETY) ×2 IMPLANT
CUFF TOURN SGL QUICK 34 (TOURNIQUET CUFF) ×2
CUFF TRNQT CYL 34X4X40X1 (TOURNIQUET CUFF) ×1 IMPLANT
DRAPE EXTREMITY T 121X128X90 (DRAPE) ×2 IMPLANT
DRAPE POUCH INSTRU U-SHP 10X18 (DRAPES) ×2 IMPLANT
DRAPE U-SHAPE 47X51 STRL (DRAPES) ×2 IMPLANT
DRSG ADAPTIC 3X8 NADH LF (GAUZE/BANDAGES/DRESSINGS) ×2 IMPLANT
DRSG PAD ABDOMINAL 8X10 ST (GAUZE/BANDAGES/DRESSINGS) ×1 IMPLANT
DURAPREP 26ML APPLICATOR (WOUND CARE) ×2 IMPLANT
ELECT REM PT RETURN 9FT ADLT (ELECTROSURGICAL) ×2
ELECTRODE REM PT RTRN 9FT ADLT (ELECTROSURGICAL) ×1 IMPLANT
EVACUATOR 1/8 PVC DRAIN (DRAIN) ×2 IMPLANT
FACESHIELD LNG OPTICON STERILE (SAFETY) ×10 IMPLANT
GLOVE BIO SURGEON STRL SZ7.5 (GLOVE) ×2 IMPLANT
GLOVE BIO SURGEON STRL SZ8 (GLOVE) ×2 IMPLANT
GLOVE BIOGEL PI IND STRL 8 (GLOVE) ×2 IMPLANT
GLOVE BIOGEL PI INDICATOR 8 (GLOVE) ×2
GOWN STRL NON-REIN LRG LVL3 (GOWN DISPOSABLE) ×2 IMPLANT
GOWN STRL REIN XL XLG (GOWN DISPOSABLE) ×2 IMPLANT
HANDPIECE INTERPULSE COAX TIP (DISPOSABLE) ×2
IMMOBILIZER KNEE 20 (SOFTGOODS) ×2
IMMOBILIZER KNEE 20 THIGH 36 (SOFTGOODS) ×1 IMPLANT
KIT BASIN OR (CUSTOM PROCEDURE TRAY) ×2 IMPLANT
MANIFOLD NEPTUNE II (INSTRUMENTS) ×2 IMPLANT
NS IRRIG 1000ML POUR BTL (IV SOLUTION) ×2 IMPLANT
PACK TOTAL JOINT (CUSTOM PROCEDURE TRAY) ×2 IMPLANT
PAD ABD 7.5X8 STRL (GAUZE/BANDAGES/DRESSINGS) ×2 IMPLANT
PADDING CAST COTTON 6X4 STRL (CAST SUPPLIES) ×4 IMPLANT
POSITIONER SURGICAL ARM (MISCELLANEOUS) ×2 IMPLANT
SET HNDPC FAN SPRY TIP SCT (DISPOSABLE) ×1 IMPLANT
SPONGE GAUZE 4X4 12PLY (GAUZE/BANDAGES/DRESSINGS) ×2 IMPLANT
STRIP CLOSURE SKIN 1/2X4 (GAUZE/BANDAGES/DRESSINGS) ×3 IMPLANT
SUCTION FRAZIER 12FR DISP (SUCTIONS) ×2 IMPLANT
SUT MNCRL AB 4-0 PS2 18 (SUTURE) ×2 IMPLANT
SUT PDS AB 1 CT1 27 (SUTURE) ×4 IMPLANT
SUT VIC AB 2-0 CT1 27 (SUTURE) ×4
SUT VIC AB 2-0 CT1 TAPERPNT 27 (SUTURE) ×3 IMPLANT
SUT VIC AB 2-0 CT2 27 (SUTURE) ×1 IMPLANT
SUT VLOC 180 0 24IN GS25 (SUTURE) ×2 IMPLANT
TOWEL OR 17X26 10 PK STRL BLUE (TOWEL DISPOSABLE) ×4 IMPLANT
TRAY FOLEY CATH 14FRSI W/METER (CATHETERS) ×2 IMPLANT
WATER STERILE IRR 1500ML POUR (IV SOLUTION) ×2 IMPLANT
WRAP KNEE MAXI GEL POST OP (GAUZE/BANDAGES/DRESSINGS) ×3 IMPLANT

## 2011-09-20 NOTE — Interval H&P Note (Signed)
History and Physical Interval Note:  09/20/2011 6:43 AM  [name]  has presented today for surgery, with the diagnosis of osteoarthritis left knee  The various methods of treatment have been discussed with the patient and family. After consideration of risks, benefits and other options for treatment, the patient has consented to  Procedure(s) (LRB): TOTAL KNEE ARTHROPLASTY (Left) as a surgical intervention .  The patient's history has been reviewed, patient examined, no change in status, stable for surgery.  I have reviewed the patients' chart and labs.  Questions were answered to the patient's satisfaction.     Loanne Drilling

## 2011-09-20 NOTE — Clinical Social Work Placement (Unsigned)
     Clinical Social Work Department CLINICAL SOCIAL WORK PLACEMENT NOTE 09/20/2011  Patient:  [name], [name]  Account Number:  [id number] Admit date:  09/20/2011  Clinical Social Worker:  Becky Sax, LCSW  Date/time:  09/20/2011 12:00 M  Clinical Social Work is seeking post-discharge placement for this patient at the following level of care:   SKILLED NURSING   (*CSW will update this form in Epic as items are completed)   09/20/2011  Patient/family provided with [facility] Department of Clinical Social Works list of facilities offering this level of care within the geographic area requested by the patient (or if unable, by the patients family).  09/20/2011  Patient/family informed of their freedom to choose among providers that offer the needed level of care, that participate in Medicare, Medicaid or managed care program needed by the patient, have an available bed and are willing to accept the patient.  09/20/2011  Patient/family informed of [facility] ownership interest in [facility], as well as of the fact that they are under no obligation to receive care at this facility.  PASARR submitted to EDS on 09/20/2011 PASARR number received from EDS on 09/20/2011  FL2 transmitted to all facilities in geographic area requested by pt/family on  09/20/2011 FL2 transmitted to all facilities within larger geographic area on   Patient informed that his/her managed care company has contracts with or will negotiate with  certain facilities, including the following:     Patient/family informed of bed offers received:  09/20/2011 Patient chooses bed at [facility] Physician recommends and patient chooses bed at    Patient to be transferred to [facility] on  09/23/2011 Patient to be transferred to facility by   The following physician request were entered in Epic:   Additional Comments:

## 2011-09-20 NOTE — Anesthesia Postprocedure Evaluation (Signed)
  Anesthesia Post-op Note  Patient: [name]  Procedure(s) Performed: Procedure(s) (LRB): TOTAL KNEE ARTHROPLASTY (Left)  Patient Location: PACU  Anesthesia Type: General  Level of Consciousness: awake and alert   Airway and Oxygen Therapy: Patient Spontanous Breathing  Post-op Pain: mild  Post-op Assessment: Post-op Vital signs reviewed, Patient's Cardiovascular Status Stable, Respiratory Function Stable, Patent Airway and No signs of Nausea or vomiting  Post-op Vital Signs: stable  Complications: No apparent anesthesia complications

## 2011-09-20 NOTE — Progress Notes (Signed)
Pt and family concerned that not all of pt's home meds were ordered or addressed after surgery.  Pt states that missing even one dose of some of her medicines is very detrimental to her and requests that I page the surgeon for orders.  Paged Dr. Lequita Halt at his office, was advised by staff that the PA will return my call.  Meds the pt would like ordered are: Florastor 1-2 caps PO daily Systane eye drops PRN  Tessalon Perles 100mg  PO PRN cough/congestion Dymista 137-4mcg spray - 2 sprays each nostril PRN congestion  Will await return call/orders.  Ardyth Gal, RN 09/20/2011

## 2011-09-20 NOTE — Progress Notes (Signed)
ANTICOAGULATION CONSULT NOTE - Initial Consult  Pharmacy Consult for Warfarin Indication: VTE prophylaxis  Allergies  Allergen Reactions  . Ampicillin     C-DIF AFTER TAKING AMPICILLIN     . Ciprofloxacin     RASH PT STATES ANY OTHER DRUGS IN [facility] FAMILY SHE IS ALLERGIC TO  . Clindamycin/Lincomycin     PT STATES HER DOCTOR TOLD HER NOT TO TAKE CLINDAMYCIN BECAUSE SHE GOT C-DIFF AFTER TAKING AMPICILLIN  . Latex Other (See Comments)    Blisters   . Pradaxa (Dabigatran Etexilate Mesylate)     INTERNAL BLEEDING    Patient Measurements:    Vital Signs: Temp: 98.2 F (36.8 C) (06/24 1044) Temp src: Oral (06/24 0526) BP: 132/69 mmHg (06/24 1044) Pulse Rate: 47  (06/24 1000)  Labs:  Basename 09/20/11 0545  HGB --  HCT --  PLT --  APTT --  LABPROT 15.5*  INR 1.20  HEPARINUNFRC --  CREATININE --  CKTOTAL --  CKMB --  TROPONINI --    CrCl is unknown because there is no height on file for the current visit.   Medical History: Past Medical History  Diagnosis Date  . Hypertension   . GERD (gastroesophageal reflux disease)   . Irritable bowel   . Cancer     HX BREAST CANCER  . Stroke     HX OF TIA-NO RESIDUAL PROBLEM--PARALYSIS RT SIDE FACE /PT'S MOUTH DROOPS-AND LOSS OF HEARING RT EAR --SINCE EAR SURGERY AS A CHILD   . Dysrhythmia     ATRIAL FIB - CHRONIC COUMADIN  . Lymphedema     HX OF - IN LEFT ARM--NO NEEDLES OR B/P'S LEFT ARM  . UTI (lower urinary tract infection)     FREQUENT  . Macular degeneration     "BEGINNINGS OF" MACULAR DEGENERATION  . Hearing impaired     Assessment: 86 YOF s/p left TKA on 6/24 to restart warfarin for VTE prophylaxis.  Patient on chronic coumadin PTA for atrial fibrillation.  Home dose reported as 7.5 mg daily except takes 5 mg on Wednesdays.  Last dose of warfarin on 6/18.  INR 1.2 today.  Goal of Therapy:  INR 2-3  Plan:  Warfarin 7.5 mg once tonight. Daily INR.  Clance Boll 09/20/2011,11:05 AM

## 2011-09-20 NOTE — Clinical Social Work Psychosocial (Unsigned)
     Clinical Social Work Department BRIEF PSYCHOSOCIAL ASSESSMENT 09/20/2011  Patient:  [name], [name]     Account Number:  [id number]     Admit date:  09/20/2011  Clinical Social Worker:  Hattie Perch  Date/Time:  09/20/2011 12:00 M  Referred by:  Physician  Date Referred:  09/20/2011 Referred for  SNF Placement   Other Referral:   Interview type:  Patient Other interview type:   spoues    PSYCHOSOCIAL DATA Living Status:  FAMILY Admitted from facility:   Level of care:   Primary support name:  Laqueta Carina Primary support relationship to patient:  SPOUSE Degree of support available:   good    CURRENT CONCERNS Current Concerns  Post-Acute Placement   Other Concerns:    SOCIAL WORK ASSESSMENT / PLAN patient is alert and oriented X3 and pre registered to go to [facility] following total knee arthroplasty.   Assessment/plan status:   Other assessment/ plan:   Information/referral to community resources:    PATIENTS/FAMILYS RESPONSE TO PLAN OF CARE: patient agreeable to D/C to [facility].

## 2011-09-20 NOTE — Op Note (Signed)
Pre-operative diagnosis- Osteoarthritis Left knee(s)  Post-operative diagnosis- Osteoarthritis  Left knee(s)  Procedure-   Left Total Knee Arthroplasty  Surgeon- Gus Rankin. Raghad Lorenz, MD  Assistant- Dimitri Ped, PA-C   Anesthesia-  General   EBL- * No blood loss amount entered *   Drains Hemovac   Tourniquet time  Total Tourniquet Time Documented: Thigh (Left) - 35 minutes   Complications- None  Condition-PACU - hemodynamically stable.   Brief Clinical Note  [name] is a 78 y.o. year old female with end stage OA of her left knee with progressively worsening pain and dysfunction. She has constant pain, with activity and at rest and significant functional deficits with difficulties even with ADLs. She has had extensive non-op management including analgesics, injections of cortisone, and home exercise program, but remains in significant pain with significant dysfunction. Radiographs show bone on bone arthritis lateral and patellofemoral with valgus deformity and tibial sclerosis. She presents now for left Total Knee Arthroplasty.   Procedure in detail---       The patient is brought into the operating room and positioned supine on the operating table. After successful administration of General anesthetic, a tourniquet is placed high on the Left thigh(s) and the lower extremity is prepped and draped in the usual sterile fashion. Time out is performed by the operating team and then the Left  lower extremity is wrapped in Esmarch, knee flexed and the tourniquet inflated to 300 mmHg.       A midline incision is made with a ten blade through the subcutaneous tissue to the level of the extensor mechanism. A fresh blade is used to make a lateral parapatellar arthrotomy due to the patients' valgus deformity. Soft tissue over the proximal lateral tibia is subperiosteally elevated to the joint line with a knife to the posterolateral corner but not including the structures of the posterolateral  corner. Soft tissue over the proximal medial tibia is elevated with attention being paid to avoiding the patellar tendon on the tibial tubercle. The patella is everted medially, knee flexed 90 degrees and the ACL and PCL are removed. Findings are bone on bone lateral and patellofemoral with large lateral osteophytes .       The drill is used to create a starting hole in the distal femur and the canal is thoroughly irrigated with sterile saline to remove the fatty contents. The 5 degree Left  valgus alignment guide is placed into the femoral canal and the distal femoral cutting block is pinned to remove 10  mm off the distal femur. Resection is made with an oscillating saw.      The tibia is subluxed forward and the menisci are removed. The extramedullary alignment guide is placed referencing proximally at the medial aspect of the tibial tubercle and distally along the second metatarsal axis and tibial crest. The block is pinned to remove 2mm off the more deficient lateral side. Resection is made with an oscillating saw. Size 2.5  is the most appropriate size for the tibia and the proximal tibia is prepared with the modular drill and keel punch for that size.      The femoral sizing guide is placed and size 3  is most appropriate. Rotation is marked off the epicondylar axis and confirmed by creating a rectangular flexion gap at 90 degrees. The size 3  cutting block is pinned in this rotation and the anterior, posterior and chamfer cuts are made with the oscillating saw. The intercondylar block is then placed and that  cut is made.      Trial size 2.5  tibial component, trial size 3  posterior stabilized femur and a 10  mm posterior stabilized rotating platform insert trial is placed. Full extension is achieved with excellent varus/valgus and   anterior/posterior balance throughout full range of motion. The patella is everted and thickness measured to be 22  mm. Free hand resection is taken to 12 mm, a 35 template  is placed, lug holes are drilled, trial patella is placed, and it tracks normally. Osteophytes are removed off the posterior femur with the trial in place. All trials are removed and the cut bone surfaces prepared with pulsatile lavage. Cement is mixed and once ready for implantation, the size 2.5  tibial implant, size 3 posterior stabilized femoral component, and the size 35  patella are cemented in place and the patella is held with the clamp. The trial insert is placed and the knee held in full extension. All extruded cement is removed and once the cement is hard the permanent 10  mm posterior stabilized rotating platform insert is placed into the tibial tray.      The wound is copiously irrigated with saline solution and the tourniquet is released for a total   tourniquet time of 35  minutes. Bleeding is identified and controlled with electrocautery. The extensor mechanism is closed with interrupted #1 PDS leaving open a small area from the superior to inferior pole of the patella to serve as a mini lateral release. Flexion against gravity is 135  degrees and the patella tracks normally. Subcutaneous tissue is closed with 2.0 vicryl and subcuticular with running 4.0 Monocryl. The catheter for the Marcaine pain pump is placed and the pump is initiated. The incision is cleaned and dried and steri-strips and a bulky sterile dressing are applied. The limb is placed into a knee immobilizer and the patient is awakened and transported to recovery in stable condition.      Please note that a surgical assistant was a medical necessity for this procedure in order to perform it in a safe and expeditious manner. Surgical assistant was necessary to retract the ligaments and vital neurovascular structures to prevent injury to them and also necessary for proper positioning of the limb to allow for anatomic placement of the prosthesis.    Gus Rankin Carrol Hougland, MD    09/20/2011, 8:31 AM

## 2011-09-20 NOTE — Transfer of Care (Signed)
Immediate Anesthesia Transfer of Care Note  Patient: [name]  Procedure(s) Performed: Procedure(s) (LRB): TOTAL KNEE ARTHROPLASTY (Left)  Patient Location: PACU  Anesthesia Type: General  Level of Consciousness: sedated, patient cooperative and responds to stimulaton  Airway & Oxygen Therapy: Patient Spontanous Breathing and Patient connected to face mask oxgen  Post-op Assessment: Report given to PACU RN and Post -op Vital signs reviewed and stable  Post vital signs: Reviewed and stable  Complications: No apparent anesthesia complications

## 2011-09-20 NOTE — Progress Notes (Signed)
   CARE MANAGEMENT NOTE 09/20/2011  Patient:  [name], [name]   Account Number:  [id number]  Date Initiated:  09/20/2011  Documentation initiated by:  Jiles Crocker  Subjective/Objective Assessment:   ADMITTED FOR Left Total Knee Arthroplasty     Action/Plan:   LIVES AT HOME WITH SPOUSE; PT WILL POSSIBLE NEED HHC VS SHORT TERM SNF DEPENDING ON PT/OT EVALUATIONS   Anticipated DC Date:  09/27/2011   Anticipated DC Plan:  HOME W HOME HEALTH SERVICES      DC Planning Services  CM consult           Status of service:  In process, will continue to follow Medicare Important Message given?  NA - LOS <3 / Initial given by admissions (If response is "NO", the following Medicare IM given date fields will be blank)  Per UR Regulation:  Reviewed for med. necessity/level of care/duration of stay  Comments:  09/20/2011- B Crislyn Willbanks RN, BSN, MHA

## 2011-09-20 NOTE — Preoperative (Signed)
Beta Blockers   Reason not to administer Beta Blockers:Not Applicable Pt took Metoprolol 09-20-11 AM

## 2011-09-20 NOTE — Anesthesia Preprocedure Evaluation (Addendum)
Anesthesia Evaluation  Patient identified by MRN, date of birth, ID band Patient awake    Reviewed: Allergy & Precautions, H&P , NPO status , Patient's Chart, lab work & pertinent test results  Airway Mallampati: II TM Distance: >3 FB Neck ROM: Full    Dental No notable dental hx.    Pulmonary neg pulmonary ROS,  breath sounds clear to auscultation  Pulmonary exam normal       Cardiovascular hypertension, + dysrhythmias Rhythm:Regular Rate:Normal     Neuro/Psych TIA 2003 TIAnegative psych ROS   GI/Hepatic Neg liver ROS, GERD-  ,  Endo/Other  negative endocrine ROS  Renal/GU negative Renal ROS  negative genitourinary   Musculoskeletal negative musculoskeletal ROS (+)   Abdominal   Peds negative pediatric ROS (+)  Hematology negative hematology ROS (+)   Anesthesia Other Findings   Reproductive/Obstetrics negative OB ROS                           Anesthesia Physical Anesthesia Plan  ASA: III  Anesthesia Plan: General   Post-op Pain Management:    Induction: Intravenous  Airway Management Planned:   Additional Equipment:   Intra-op Plan:   Post-operative Plan: Extubation in OR  Informed Consent: I have reviewed the patients History and Physical, chart, labs and discussed the procedure including the risks, benefits and alternatives for the proposed anesthesia with the patient or authorized representative who has indicated his/her understanding and acceptance.   Dental advisory given  Plan Discussed with: CRNA  Anesthesia Plan Comments: (Discussed r/b general versus spinal and she prefers general.)       Anesthesia Quick Evaluation

## 2011-09-21 ENCOUNTER — Encounter (HOSPITAL_COMMUNITY): Payer: Self-pay | Admitting: Orthopedic Surgery

## 2011-09-21 DIAGNOSIS — D62 Acute posthemorrhagic anemia: Secondary | ICD-10-CM

## 2011-09-21 DIAGNOSIS — E871 Hypo-osmolality and hyponatremia: Secondary | ICD-10-CM

## 2011-09-21 DIAGNOSIS — Z9289 Personal history of other medical treatment: Secondary | ICD-10-CM

## 2011-09-21 LAB — CBC
HCT: 20.8 % — ABNORMAL LOW (ref 36.0–46.0)
Hemoglobin: 6.8 g/dL — CL (ref 12.0–15.0)
MCH: 28 pg (ref 26.0–34.0)
MCHC: 32.7 g/dL (ref 30.0–36.0)
MCV: 85.6 fL (ref 78.0–100.0)
Platelets: 228 10*3/uL (ref 150–400)
RBC: 2.43 MIL/uL — ABNORMAL LOW (ref 3.87–5.11)
RDW: 13.7 % (ref 11.5–15.5)
WBC: 8.5 10*3/uL (ref 4.0–10.5)

## 2011-09-21 LAB — BASIC METABOLIC PANEL
BUN: 16 mg/dL (ref 6–23)
CO2: 24 mEq/L (ref 19–32)
Calcium: 8.1 mg/dL — ABNORMAL LOW (ref 8.4–10.5)
Chloride: 98 mEq/L (ref 96–112)
Creatinine, Ser: 0.96 mg/dL (ref 0.50–1.10)
GFR calc Af Amer: 65 mL/min — ABNORMAL LOW (ref >90)
GFR calc non Af Amer: 56 mL/min — ABNORMAL LOW (ref >90)
Glucose, Bld: 141 mg/dL — ABNORMAL HIGH (ref 70–99)
Potassium: 3.6 mEq/L (ref 3.5–5.1)
Sodium: 129 mEq/L — ABNORMAL LOW (ref 135–145)

## 2011-09-21 LAB — PREPARE RBC (CROSSMATCH)

## 2011-09-21 LAB — PROTIME-INR
INR: 1.26 (ref 0.00–1.49)
Prothrombin Time: 16.1 seconds — ABNORMAL HIGH (ref 11.6–15.2)

## 2011-09-21 MED ORDER — BENZONATATE 100 MG PO CAPS
100.0000 mg | ORAL_CAPSULE | Freq: Three times a day (TID) | ORAL | Status: DC | PRN
  Filled 2011-09-21: qty 1

## 2011-09-21 MED ORDER — FLUTICASONE PROPIONATE 50 MCG/ACT NA SUSP: 2.0000 | Freq: Every day | NASAL | Status: DC | PRN

## 2011-09-21 MED ORDER — ARTIFICIAL TEARS OP OINT: TOPICAL_OINTMENT | Freq: Three times a day (TID) | OPHTHALMIC | Status: DC | PRN

## 2011-09-21 MED ORDER — FUROSEMIDE 10 MG/ML IJ SOLN
10.0000 mg | Freq: Once | INTRAMUSCULAR | Status: AC
  Administered 2011-09-21: 10 mg via INTRAVENOUS
  Filled 2011-09-21: qty 1

## 2011-09-21 MED ORDER — MORPHINE SULFATE 2 MG/ML IJ SOLN
1.0000 mg | INTRAMUSCULAR | Status: DC | PRN
  Administered 2011-09-21: 1 mg via INTRAVENOUS
  Administered 2011-09-21 – 2011-09-22 (×6): 2 mg via INTRAVENOUS
  Filled 2011-09-21 (×7): qty 1

## 2011-09-21 MED ORDER — WARFARIN SODIUM 7.5 MG PO TABS
7.5000 mg | ORAL_TABLET | Freq: Once | ORAL | Status: AC
  Administered 2011-09-21: 7.5 mg via ORAL
  Filled 2011-09-21: qty 1

## 2011-09-21 MED ORDER — POLYSACCHARIDE IRON COMPLEX 150 MG PO CAPS
150.0000 mg | ORAL_CAPSULE | Freq: Every day | ORAL | Status: DC
  Administered 2011-09-21 – 2011-09-23 (×3): 150 mg via ORAL
  Filled 2011-09-21 (×3): qty 1

## 2011-09-21 MED ORDER — AZELASTINE HCL 0.1 % NA SOLN: 2.0000 | Freq: Two times a day (BID) | NASAL | Status: DC | PRN

## 2011-09-21 NOTE — Progress Notes (Signed)
Physical Therapy Treatment Patient Details Name: Delories Mauri MRN: [id number] DOB: Apr 22, 1933 Today's Date: 09/21/2011 Time: 1510-1530 PT Time Calculation (min): 20 min  PT Assessment / Plan / Recommendation Comments on Treatment Session  Pt assisted back to bed and performed exercises.  Pt concerned with getting in CPM so called Ortho Tech to apply.    Follow Up Recommendations  Skilled nursing facility    Barriers to Discharge        Equipment Recommendations  Defer to next venue    Recommendations for Other Services    Frequency     Plan Discharge plan remains appropriate;Frequency remains appropriate    Precautions / Restrictions Precautions Precautions: Fall;Knee Precaution Comments: HOH, speak into L ear Required Braces or Orthoses: Knee Immobilizer - Left Knee Immobilizer - Left: Discontinue once straight leg raise with < 10 degree lag Restrictions LLE Weight Bearing: Weight bearing as tolerated   Pertinent Vitals/Pain 4/10 L knee pain, pt premedicated by IV    Mobility  Bed Mobility Bed Mobility: Sit to Supine Sit to Supine: 4: Min assist Details for Bed Mobility Assistance: assist for L LE, verbal cues for technique Transfers Transfers: Sit to Stand;Stand to Sit;Stand Pivot Transfers Sit to Stand: 3: Mod assist;With upper extremity assist;From chair/3-in-1 Stand to Sit: 3: Mod assist;With upper extremity assist;To bed Stand Pivot Transfers: 3: Mod assist Details for Transfer Assistance: +2 for safety, verbal cues for safe technique, assist for weakness, pt continues to be unable to lift feet from floor with cues to push through UEs so pt turned feet and bed brought behind her Ambulation/Gait Ambulation/Gait Assistance: Not tested (comment)    Exercises Total Joint Exercises Ankle Circles/Pumps: AROM;Both;10 reps;Supine Quad Sets: Supine;10 reps;AROM;Both Short Arc Quad: AAROM;Strengthening;Left;15 reps;Supine Heel Slides: AAROM;Left;15  reps;Supine Goniometric ROM: L knee 0-40* AAROM supine   PT Diagnosis:    PT Problem List:   PT Treatment Interventions:     PT Goals Acute Rehab PT Goals PT Goal: Sit to Supine/Side - Progress: Progressing toward goal PT Goal: Sit to Stand - Progress: Progressing toward goal PT Goal: Stand to Sit - Progress: Progressing toward goal  Visit Information  Last PT Received On: 09/21/11 Assistance Needed: +2    Subjective Data  Subjective: "Am I gonna get in that big machine?"  CPM   Cognition  Overall Cognitive Status: Appears within functional limits for tasks assessed/performed    Balance     End of Session PT - End of Session Equipment Utilized During Treatment: Gait belt;Left knee immobilizer Activity Tolerance: Patient tolerated treatment well Patient left: in bed;with call bell/phone within reach;with family/visitor present     Horris Speros,KATHrine E 09/21/2011, 4:00 PM Pager: [phone]

## 2011-09-21 NOTE — Progress Notes (Signed)
ANTICOAGULATION CONSULT NOTE - Follow Up Consult  Pharmacy Consult for warfarin Indication: atrial fibrillation and VTE prophylaxis  Allergies  Allergen Reactions  . Ampicillin     C-DIF AFTER TAKING AMPICILLIN     . Ciprofloxacin     RASH PT STATES ANY OTHER DRUGS IN [facility] FAMILY SHE IS ALLERGIC TO  . Clindamycin/Lincomycin     PT STATES HER DOCTOR TOLD HER NOT TO TAKE CLINDAMYCIN BECAUSE SHE GOT C-DIFF AFTER TAKING AMPICILLIN  . Latex Other (See Comments)    Blisters   . Pradaxa (Dabigatran Etexilate Mesylate)     INTERNAL BLEEDING    Patient Measurements: Height: 5\' 4"  (162.6 cm) Weight: 170 lb (77.111 kg) IBW/kg (Calculated) : 54.7   Vital Signs: Temp: 98.2 F (36.8 C) (06/25 0930) Temp src: Oral (06/25 0930) BP: 121/71 mmHg (06/25 0930) Pulse Rate: 63  (06/25 0930)  Labs:  Basename 09/21/11 0425 09/20/11 0545  HGB 6.8* --  HCT 20.8* --  PLT 228 --  APTT -- --  LABPROT 16.1* 15.5*  INR 1.26 1.20  HEPARINUNFRC -- --  CREATININE 0.96 --  CKTOTAL -- --  CKMB -- --  TROPONINI -- --    Estimated Creatinine Clearance: 50.1 ml/min (by C-G formula based on Cr of 0.96).  Assessment:  [name] s/p left TKA on 6/24 restarted warfarin that evening for VTE prophylaxis. Patient on chronic coumadin PTA for atrial fibrillation. Home dose reported as 7.5 mg daily except takes 5 mg on Wednesdays.   INR 1.26, increasing.  Hgb 6.8 this AM following TKA yesterday, surgery ordered PRBC transfusions and attributing to blood loss 2/2 procedure.  Note indicates to continue warfarin re-initiation and lovenox 30 mg q12h.  Goal of Therapy:  INR 2-3   Plan:  Warfarin 7.5 mg once tonight. F/u INR and Hgb in AM.  [name] 09/21/2011,10:01 AM

## 2011-09-21 NOTE — Clinical Documentation Improvement (Signed)
Abnormal Labs Clarification  THIS DOCUMENT IS NOT A PERMANENT PART OF THE MEDICAL RECORD  TO RESPOND TO THE THIS QUERY, FOLLOW THE INSTRUCTIONS BELOW:  1. If needed, update documentation for the patient's encounter via the notes activity.  2. Access this query again and click edit on the [employer].  3. After updating, or not, click F2 to complete all highlighted (required) fields concerning your review. Select "additional documentation in the medical record" OR "no additional documentation provided".  4. Click Sign note button.  5. The deficiency will fall out of your InBasket *Please let us know if you are not able to complete this workflow by phone or e-mail (listed below).  Please update your documentation within the medical record to reflect your response to this query.                                                                                   09/21/11  Dear Dr. [facility]  In a better effort to capture your patient's severity of illness, reflect appropriate length of stay and utilization of resources, a review of the medical record has revealed the following indicators.    Based on your clinical judgment, please clarify and document in a progress note and/or discharge summary the clinical condition associated with the following supporting information:  In responding to this query please exercise your independent judgment.  The fact that a query is asked, does not imply that any particular answer is desired or expected.  Abnormal findings (laboratory, x-ray, pathologic, and other diagnostic results) are not coded and reported unless the physician indicates their clinical significance.   The medical record reflects the following clinical findings, please clarify the diagnostic and/or clinical significance:      09/21/11 per progr note by PA noted abnormal lab values..." 09/21/11 0425 NA 129* ..." For accurate DX specificity & severity can noted abn lab values  be further clarifed/linked with cond being eval's/ mon'd & tx'd. Thank you   Possible Clinical Conditions?                               -Hyponatremia  -Abnormal Lab Value  - Other Condition (please specify) - Cannot Clinically Determine  Clinical Information:  Lab Test: 09/21/11 B-met results noted  Normal Values: pre-op: Sodium 135 -145 mEq/L : 131 (L)   Patient Values: 09/21/11 Prog note..." 09/21/11 0425 NA 129*"...  Treatment:  09/21/11 prog note..."strict I&O and urinary output monitoring"... Maintenance IV flds, repeat B-met x 2 days   Reviewed: additional documentation in the medical record-- In discharge diagnosis in the discharge summary   Thank You,  Toribio Harbour, RN, BSN, CCDS Certified Clinical Documentation Specialist Pager: [phone]  [facility] Information [facility] [place]

## 2011-09-21 NOTE — Clinical Documentation Improvement (Signed)
Anemia Blood Loss Clarification  THIS DOCUMENT IS NOT A PERMANENT PART OF THE MEDICAL RECORD  RESPOND TO THE THIS QUERY, FOLLOW THE INSTRUCTIONS BELOW:  1. If needed, update documentation for the patient's encounter via the notes activity.  2. Access this query again and click edit on the In [employer].  3. After updating, or not, click F2 to complete all highlighted (required) fields concerning your review. Select "additional documentation in the medical record" OR "no additional documentation provided".  4. Click Sign note button.  5. The deficiency will fall out of your In Basket *Please let us know if you are not able to complete this workflow by phone or e-mail (listed below).        09/21/11  Dear Dr.F [facility]  In an effort to better capture your patient's severity of illness, reflect appropriate length of stay and utilization of resources, a review of the patient medical record has revealed the following indicators.    Based on your clinical judgment, please clarify and document in a progress note and/or discharge summary the clinical condition associated with the following supporting information:  In responding to this query please exercise your independent judgment.  The fact that a query is asked, does not imply that any particular answer is desired or expected.  09/21/11 prog note by PA noted abnormal lab values..." 09/21/11 0425 HGB 6.8*/ HCT 20.8*".Marland KitchenMarland KitchenFor accurate Dx specificity & severity can abn labs values be further clarified/ linked to cond being eval'd/ mon'd & tx'd.  Thank you   Possible Clinical Conditions?   Expected Acute Blood Loss Anemia  Acute Blood Loss Anemia Acute on chronic blood loss anemia  Chronic blood loss anemia  Precipitous drop in Hematocrit  Other Condition________________  Cannot Clinically Determine   Supporting Information:  Risk Factors:  09/20/11 L TKA..per op note... "EBL- * No blood loss amount entered  *"...  Signs and Symptoms:  Diagnostics: Pre-OP H&H: 09/10/11 hgb 10.4/ hct 31.7 Current H&H: 09/21/11 Prog note..." 09/21/11 0425 HGB 6.8*/ HCT 20.8*"...   Treatments: 09/21/11 Progr note.Marland KitchenMarland Kitchen"Continue foley due to blood transfusion; will continue until blood transfusion complete, strict I&O and urinary output monitoring    Reviewed: additional documentation in the medical record- In discharge diagnosis in the discharge summary  Thank You,  Toribio Harbour, RN, BSN, CCDS Certified Clinical Documentation Specialist Pager: [phone]  Health Information Management [place]

## 2011-09-21 NOTE — Progress Notes (Signed)
Subjective: 1 Day Post-Op Procedure(s) (LRB): TOTAL KNEE ARTHROPLASTY (Left) Patient reports pain as mild and moderate.   Patient seen in rounds with Dr. Lequita Halt. Patient is well, but has had some minor complaints of difficulty sleeping. We will start therapy today.  Plan is to go Skilled nursing facility after hospital stay. She wants to look into [facility].  Objective: Vital signs in last 24 hours: Temp:  [97.3 F (36.3 C)-98.4 F (36.9 C)] 98.4 F (36.9 C) (06/25 0650) Pulse Rate:  [47-82] 64  (06/25 0650) Resp:  [8-28] 18  (06/25 0650) BP: (101-179)/(49-76) 124/67 mmHg (06/25 0650) SpO2:  [95 %-100 %] 96 % (06/25 0522) Weight:  [77.111 kg (170 lb)] 77.111 kg (170 lb) (06/24 1044)  Intake/Output from previous day:  Intake/Output Summary (Last 24 hours) at 09/21/11 0744 Last data filed at 09/21/11 0650  Gross per 24 hour  Intake 3807.5 ml  Output   2908 ml  Net  899.5 ml    Intake/Output this shift: UOP 600 since MN  Labs:  [facility] 09/21/11 0425  HGB 6.8*    Basename 09/21/11 0425  WBC 8.5  RBC 2.43*  HCT 20.8*  PLT 228    Basename 09/21/11 0425  NA 129*  K 3.6  CL 98  CO2 24  BUN 16  CREATININE 0.96  GLUCOSE 141*  CALCIUM 8.1*    Basename 09/21/11 0425 09/20/11 0545  LABPT -- --  INR 1.26 1.20    EXAM General - Patient is Alert, Appropriate and Oriented Extremity - Neurovascular intact Sensation intact distally Dorsiflexion/Plantar flexion intact Dressing - dressing C/D/I Motor Function - intact, moving foot and toes well on exam.  Hemovac pulled without difficulty.  Past Medical History  Diagnosis Date  . Hypertension Hygroton, cozaar(parameter)  . GERD (gastroesophageal reflux disease) Protonix  . Irritable bowel   . Cancer     HX BREAST CANCER  . Stroke     HX OF TIA-NO RESIDUAL PROBLEM--PARALYSIS RT SIDE FACE /PT'S MOUTH DROOPS-AND LOSS OF HEARING RT EAR --SINCE EAR SURGERY AS A CHILD   . Dysrhythmia Metoprolol    ATRIAL FIB  - CHRONIC COUMADIN  . Lymphedema     HX OF - IN LEFT ARM--NO NEEDLES OR B/P'S LEFT ARM  . UTI (lower urinary tract infection)     FREQUENT  . Macular degeneration     "BEGINNINGS OF" MACULAR DEGENERATION  . Hearing impaired     Assessment/Plan: 1 Day Post-Op Procedure(s) (LRB): TOTAL KNEE ARTHROPLASTY (Left) Principal Problem:  *OA (osteoarthritis) of knee   Advance diet Up with therapy Continue foley due to blood transfusion; will continue until blood transfusion complete, strict I&O and urinary output monitoring Discharge to SNF  DVT Prophylaxis - Lovenox and Coumadin Weight-Bearing as tolerated to left leg Keep foley until tomorrow. No vaccines. D/C PCA Morphine, Change to IV push D/C O2 and Pulse OX and try on Room Air  Patient requesting these meds to be resumed: Florastor 1-2 caps PO daily - Probiotic so we will hold that temporarily Systane eye drops PRN - Resume Tessalon Perles 100mg  PO PRN cough/congestion - Resume Dymista 137-75mcg spray - 2 sprays each nostril PRN congestion - Resume  Jamara Vary 09/21/2011, 7:44 AM

## 2011-09-21 NOTE — Progress Notes (Signed)
CRITICAL VALUE ALERT  Critical value received:  hgb 6.8  Date of notification:  09/21/11  Time of notification:  0505  Critical value read back:yes  Nurse who received alert:  Verdon Cummins RN  MD notified (1st page):  On call PA for Dr. Despina Hick  Time of first page:  0515  MD notified (2nd page):  Time of second page:  Responding MD:  On call PA for Dr. Despina Hick  Time MD responded:  [phone]

## 2011-09-21 NOTE — Evaluation (Signed)
Physical Therapy Evaluation Patient Details Name: [name] MRN: [id number] DOB: 1933-07-17 Today's Date: 09/21/2011 Time: 4098-1191 PT Time Calculation (min): 29 min  PT Assessment / Plan / Recommendation Clinical Impression  Pt s/p L TKA.  Pt would benefit from acute PT services in order to improve L LE strength and ROM to increase independence with transfers and ambulation to prepare for d/c to SNF.  Pt on 2nd unit of PRBCs upon arriving and did report minimal dizziness with transfer over to recliner.  Pt also reports her balance is poor since childhood ear surgery but she has not had any falls at home.    PT Assessment  Patient needs continued PT services    Follow Up Recommendations  Skilled nursing facility    Barriers to Discharge        lEquipment Recommendations  Defer to next venue    Recommendations for Other Services     Frequency 7X/week    Precautions / Restrictions Precautions Precautions: Fall;Knee Required Braces or Orthoses: Knee Immobilizer - Left Knee Immobilizer - Left: Discontinue once straight leg raise with < 10 degree lag Restrictions Weight Bearing Restrictions: Yes LLE Weight Bearing: Weight bearing as tolerated   Pertinent Vitals/Pain Pt reports pain increased to 8/10 with transfer.  Pt agreed to use call bell if she needs more pain meds but declined at this time.  Encouraged PCA and placed ice packs.      Mobility  Bed Mobility Bed Mobility: Supine to Sit Supine to Sit: 3: Mod assist Details for Bed Mobility Assistance: assist for L LE and trunk, verbal cues for technique Transfers Transfers: Stand to Sit;Sit to Stand;Stand Pivot Transfers Sit to Stand: 3: Mod assist;With upper extremity assist;From bed;From elevated surface Stand to Sit: 3: Mod assist;With upper extremity assist;To chair/3-in-1 Stand Pivot Transfers: 3: Mod assist Details for Transfer Assistance: verbal cues for L LE forward, hand placement, posture upon standing (pt  tends to go into forward flexion), pt with decreased ability to pick up feet from floor so required increased verbal cues for turning feet and RW and pulled chair behing pt Ambulation/Gait Ambulation/Gait Assistance: Not tested (comment)    Exercises     PT Diagnosis: Difficulty walking;Acute pain  PT Problem List: Decreased strength;Decreased range of motion;Decreased activity tolerance;Decreased mobility;Decreased knowledge of precautions;Pain;Decreased safety awareness;Decreased knowledge of use of DME PT Treatment Interventions: DME instruction;Gait training;Functional mobility training;Therapeutic activities;Patient/family education;Therapeutic exercise;Balance training;Neuromuscular re-education   PT Goals Acute Rehab PT Goals PT Goal Formulation: With patient Time For Goal Achievement: 09/28/11 Potential to Achieve Goals: Good Pt will go Supine/Side to Sit: with supervision PT Goal: Supine/Side to Sit - Progress: Goal set today Pt will go Sit to Supine/Side: with supervision PT Goal: Sit to Supine/Side - Progress: Goal set today Pt will go Sit to Stand: with supervision PT Goal: Sit to Stand - Progress: Goal set today Pt will go Stand to Sit: with supervision PT Goal: Stand to Sit - Progress: Goal set today Pt will Ambulate: 51 - 150 feet;with supervision;with rolling walker PT Goal: Ambulate - Progress: Goal set today  Visit Information  Last PT Received On: 09/21/11 Assistance Needed: +2    Subjective Data  Subjective: "I haven't been to the chair yet."   Prior Functioning  Home Living Lives With: Spouse Additional Comments: Pt plans to d/c to SNF. 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, [facility]  in left)    Cognition  Overall Cognitive Status: Appears within functional limits for tasks  assessed/performed Arousal/Alertness: Awake/alert Orientation Level: Appears intact for tasks assessed Behavior During Session: [facility] for tasks performed    Extremity/Trunk Assessment Right Upper Extremity Assessment RUE ROM/Strength/Tone: [facility] for tasks assessed Left Upper Extremity Assessment LUE ROM/Strength/Tone: WFL for tasks assessed Right Lower Extremity Assessment RLE ROM/Strength/Tone: Deficits RLE ROM/Strength/Tone Deficits: grossly 3/5 per functional observation Left Lower Extremity Assessment LLE ROM/Strength/Tone: Deficits LLE ROM/Strength/Tone Deficits: kept in KI, TBA   Balance    End of Session PT - End of Session Equipment Utilized During Treatment: Gait belt;Left knee immobilizer;Oxygen Activity Tolerance: Patient limited by pain Patient left: in chair;with call bell/phone within reach   [facility] E 09/21/2011, 11:57 AM Pager: [phone]

## 2011-09-22 LAB — CBC
HCT: 28.5 % — ABNORMAL LOW (ref 36.0–46.0)
Hemoglobin: 9.7 g/dL — ABNORMAL LOW (ref 12.0–15.0)
MCH: 28.4 pg (ref 26.0–34.0)
MCHC: 34 g/dL (ref 30.0–36.0)
MCV: 83.6 fL (ref 78.0–100.0)
Platelets: 215 10*3/uL (ref 150–400)
RBC: 3.41 MIL/uL — ABNORMAL LOW (ref 3.87–5.11)
RDW: 14.2 % (ref 11.5–15.5)
WBC: 10.6 10*3/uL — ABNORMAL HIGH (ref 4.0–10.5)

## 2011-09-22 LAB — BASIC METABOLIC PANEL
BUN: 11 mg/dL (ref 6–23)
CO2: 25 mEq/L (ref 19–32)
Calcium: 8.6 mg/dL (ref 8.4–10.5)
Chloride: 97 mEq/L (ref 96–112)
Creatinine, Ser: 0.79 mg/dL (ref 0.50–1.10)
GFR calc Af Amer: >90 mL/min (ref >90)
GFR calc non Af Amer: 79 mL/min — ABNORMAL LOW (ref >90)
Glucose, Bld: 124 mg/dL — ABNORMAL HIGH (ref 70–99)
Potassium: 3.5 mEq/L (ref 3.5–5.1)
Sodium: 131 mEq/L — ABNORMAL LOW (ref 135–145)

## 2011-09-22 LAB — TYPE AND SCREEN
ABO/RH(D): A POS
Antibody Screen: NEGATIVE
Unit division: 0
Unit division: 0

## 2011-09-22 LAB — PROTIME-INR
INR: 1.78 — ABNORMAL HIGH (ref 0.00–1.49)
Prothrombin Time: 21 seconds — ABNORMAL HIGH (ref 11.6–15.2)

## 2011-09-22 MED ORDER — WARFARIN SODIUM 5 MG PO TABS
5.0000 mg | ORAL_TABLET | Freq: Once | ORAL | Status: AC
  Administered 2011-09-22: 5 mg via ORAL
  Filled 2011-09-22: qty 1

## 2011-09-22 NOTE — Progress Notes (Signed)
Physical Therapy Treatment Patient Details Name: [name] MRN: [id number] DOB: 07-May-1933 Today's Date: 09/22/2011 Time: 1191-4782 PT Time Calculation (min): 22 min  PT Assessment / Plan / Recommendation Comments on Treatment Session  Pt continues to require increased time and assist for mobility.  Assisted pt back to bed and performed exercises.    Follow Up Recommendations  Skilled nursing facility    Barriers to Discharge        Equipment Recommendations  Defer to next venue    Recommendations for Other Services    Frequency     Plan Discharge plan remains appropriate;Frequency remains appropriate    Precautions / Restrictions Precautions Precautions: Knee;Fall Precaution Comments: HOH-speak into left ear Required Braces or Orthoses: Knee Immobilizer - Left Knee Immobilizer - Left: Discontinue once straight leg raise with < 10 degree lag Restrictions LLE Weight Bearing: Weight bearing as tolerated   Pertinent Vitals/Pain 8/10 L knee pain, RN made aware, ice applied    Mobility  Bed Mobility Bed Mobility: Supine to Sit Sit to Supine: 4: Min assist;With rail;HOB elevated Details for Bed Mobility Assistance: assist for L LE, verbal cues for technique Transfers Transfers: Stand to Sit;Sit to Stand Sit to Stand: 1: +2 Total assist;With upper extremity assist;From chair/3-in-1 Sit to Stand: Patient Percentage: 60% Stand to Sit: 1: +2 Total assist;With upper extremity assist;To bed Stand to Sit: Patient Percentage: 60% Stand Pivot Transfers: 1: +2 Total assist Stand Pivot Transfers: Patient Percentage: 70% Details for Transfer Assistance: +2 for safety, verbal cues for technique and assist for weakness and bringing body weight forward, increased verbal cues for turning feet so bed could be brought behind pt, increased time required Ambulation/Gait Ambulation/Gait Assistance: 1: +2 Total assist Ambulation/Gait: Patient Percentage: 50% Ambulation Distance (Feet):  (14  steps) Assistive device: Rolling walker Ambulation/Gait Assistance Details: increased time for weight shifting and advancing LEs, constant verbal cues for sequence and safe technique, encouraged increased use of upper body through RW to assist with pain in L LE. Gait Pattern: Step-to pattern;Antalgic Gait velocity: very very very slow    Exercises Total Joint Exercises Ankle Circles/Pumps: AROM;Both;10 reps;Supine Quad Sets: Supine;10 reps;AROM;Both Short Arc Quad: AAROM;Strengthening;Left;15 reps;Supine Heel Slides: AAROM;Left;15 reps;Supine Hip ABduction/ADduction: AAROM;Strengthening;Left;10 reps;Supine Goniometric ROM: L knee 0-40* AAROM supine limited by pain   PT Diagnosis:    PT Problem List:   PT Treatment Interventions:     PT Goals Acute Rehab PT Goals PT Goal: Sit to Supine/Side - Progress: Progressing toward goal PT Goal: Sit to Stand - Progress: Progressing toward goal PT Goal: Stand to Sit - Progress: Progressing toward goal PT Goal: Ambulate - Progress: Progressing toward goal  Visit Information  Last PT Received On: 09/22/11 Assistance Needed: +2    Subjective Data  Subjective: "yes, I'd like to get back to bed."   Cognition  Overall Cognitive Status: Appears within functional limits for tasks assessed/performed    Balance     End of Session PT - End of Session Equipment Utilized During Treatment: Gait belt;Left knee immobilizer Activity Tolerance: Patient limited by pain;Patient limited by fatigue Patient left: in bed;with call bell/phone within reach;with family/visitor present   GP     Lamonica Trueba,KATHrine E 09/22/2011, 3:48 PM Pager: [phone]

## 2011-09-22 NOTE — Progress Notes (Signed)
ANTICOAGULATION CONSULT NOTE - Follow Up Consult  Pharmacy Consult for warfarin Indication: atrial fibrillation and VTE prophylaxis  Allergies  Allergen Reactions  . Ampicillin     C-DIF AFTER TAKING AMPICILLIN     . Ciprofloxacin     RASH PT STATES ANY OTHER DRUGS IN [facility] FAMILY SHE IS ALLERGIC TO  . Clindamycin/Lincomycin     PT STATES HER DOCTOR TOLD HER NOT TO TAKE CLINDAMYCIN BECAUSE SHE GOT C-DIFF AFTER TAKING AMPICILLIN  . Latex Other (See Comments)    Blisters   . Pradaxa (Dabigatran Etexilate Mesylate)     INTERNAL BLEEDING    Patient Measurements: Height: 5\' 4"  (162.6 cm) Weight: 170 lb (77.111 kg) IBW/kg (Calculated) : 54.7   Vital Signs: Temp: 98.7 F (37.1 C) (06/26 0522) Temp src: Oral (06/26 0522) BP: 167/70 mmHg (06/26 0522) Pulse Rate: 74  (06/26 0522)  Labs:  [name] 09/22/11 0521 09/21/11 0425 09/20/11 0545  HGB 9.7* 6.8* --  HCT 28.5* 20.8* --  PLT 215 228 --  APTT -- -- --  LABPROT 21.0* 16.1* 15.5*  INR 1.78* 1.26 1.20  HEPARINUNFRC -- -- --  CREATININE 0.79 0.96 --  CKTOTAL -- -- --  CKMB -- -- --  TROPONINI -- -- --    Estimated Creatinine Clearance: 60.2 ml/min (by C-G formula based on Cr of 0.79).  Assessment:  84 YOF s/p left TKA on 6/24 restarted warfarin that evening for VTE prophylaxis. Patient on chronic coumadin PTA for atrial fibrillation. Home dose reported as 7.5 mg daily except takes 5 mg on Wednesdays.   INR 1.78, increasing.  Hgb improved following PRBC transfusions yesterday (blood loss 2/2 procedure).    Goal of Therapy:  INR 2-3   Plan:  Warfarin 5 mg once tonight. Will look to discontinue Lovenox tomorrow once INR>1.8. F/u INR in AM.  Clance Boll 09/22/2011,9:21 AM

## 2011-09-22 NOTE — Progress Notes (Signed)
Subjective: 2 Days Post-Op Procedure(s) (LRB): TOTAL KNEE ARTHROPLASTY (Left) Patient reports pain as moderate.  Her husband is at her bedside. Patient is having problems with pain in the knee, requiring pain medications Plan is to go Skilled nursing facility after hospital stay.  Objective: Vital signs in last 24 hours: Temp:  [98.6 F (37 C)-98.7 F (37.1 C)] 98.6 F (37 C) (06/26 1420) Pulse Rate:  [74-86] 86  (06/26 1420) Resp:  [18] 18  (06/26 1420) BP: (151-167)/(55-70) 151/55 mmHg (06/26 1420) SpO2:  [94 %-97 %] 97 % (06/26 1420)  Intake/Output from previous day:  Intake/Output Summary (Last 24 hours) at 09/22/11 2155 Last data filed at 09/22/11 1934  Gross per 24 hour  Intake   1055 ml  Output   2750 ml  Net  -1695 ml    Intake/Output this shift: Total I/O In: -  Out: 100 [Urine:100]  Labs:  [facility] 09/22/11 0521 09/21/11 0425  HGB 9.7* 6.8*    Basename 09/22/11 0521 09/21/11 0425  WBC 10.6* 8.5  RBC 3.41* 2.43*  HCT 28.5* 20.8*  PLT 215 228    Basename 09/22/11 0521 09/21/11 0425  NA 131* 129*  K 3.5 3.6  CL 97 98  CO2 25 24  BUN 11 16  CREATININE 0.79 0.96  GLUCOSE 124* 141*  CALCIUM 8.6 8.1*    Basename 09/22/11 0521 09/21/11 0425  LABPT -- --  INR 1.78* 1.26    EXAM General - Patient is Alert, Appropriate and Oriented Extremity - Neurovascular intact Sensation intact distally Incision: dressing C/D/I Dressing/Incision - clean, dry, no drainage, healing Motor Function - intact, moving foot and toes well on exam.   Past Medical History  Diagnosis Date  . Hypertension   . GERD (gastroesophageal reflux disease)   . Irritable bowel   . Cancer     HX BREAST CANCER  . Stroke     HX OF TIA-NO RESIDUAL PROBLEM--PARALYSIS RT SIDE FACE /PT'S MOUTH DROOPS-AND LOSS OF HEARING RT EAR --SINCE EAR SURGERY AS A CHILD   . Dysrhythmia     ATRIAL FIB - CHRONIC COUMADIN  . Lymphedema     HX OF - IN LEFT ARM--NO NEEDLES OR B/P'S LEFT ARM  . UTI  (lower urinary tract infection)     FREQUENT  . Macular degeneration     "BEGINNINGS OF" MACULAR DEGENERATION  . Hearing impaired     Assessment/Plan: 2 Days Post-Op Procedure(s) (LRB): TOTAL KNEE ARTHROPLASTY (Left) Principal Problem:  *OA (osteoarthritis) of knee Active Problems:  Postop Acute blood loss anemia  Postop Transfusion  Postop Hyponatremia   Up with therapy Plan for discharge tomorrow Discharge to SNF  DVT Prophylaxis - Lovenox and Coumadin Weight-Bearing as tolerated to left leg  Kloe Oates 09/22/2011, 9:55 PM

## 2011-09-22 NOTE — Evaluation (Signed)
Occupational Therapy Evaluation Patient Details Name: [name] MRN: [id number] DOB: 01/13/1934 Today's Date: 09/22/2011 Time: 4098-1191 OT Time Calculation (min): 27 min  OT Assessment / Plan / Recommendation Clinical Impression  Pt s/p L TKA.  She displays decreased strength, functional mobility and ADL and would benefit from skilled OT services to maximize ADL independence for next venue of care.     OT Assessment  Patient needs continued OT Services    Follow Up Recommendations  Skilled nursing facility    Barriers to Discharge      Equipment Recommendations  Defer to next venue    Recommendations for Other Services    Frequency  Min 1X/week    Precautions / Restrictions Precautions Precautions: Knee;Fall Precaution Comments: HOH-speak into left ear Required Braces or Orthoses: Knee Immobilizer - Left Knee Immobilizer - Left: Discontinue once straight leg raise with < 10 degree lag Restrictions Weight Bearing Restrictions: Yes LLE Weight Bearing: Weight bearing as tolerated        ADL  Eating/Feeding: Simulated;Independent Where Assessed - Eating/Feeding: Bed level Grooming: Simulated;Wash/dry face;Set up Where Assessed - Grooming: Supine, head of bed up Upper Body Bathing: Simulated;Chest;Right arm;Left arm;Abdomen;Supervision/safety;Set up Where Assessed - Upper Body Bathing: Unsupported sitting Lower Body Bathing: +2 Total assistance Lower Body Bathing: Patient Percentage: 30% Where Assessed - Lower Body Bathing: Supported sit to stand Upper Body Dressing: Simulated;Minimal assistance;Other (comment) (with gown due to IV) Where Assessed - Upper Body Dressing: Unsupported sitting Lower Body Dressing: +2 Total assistance Lower Body Dressing: Patient Percentage: 10% Where Assessed - Lower Body Dressing: Supported sit to stand Toilet Transfer: Simulated;+2 Total assistance Toilet Transfer: Patient Percentage: 60% Toilet Transfer Method: Stand pivot;Other  (comment) (took 2-3 steps forward and then chair pulled up) Toileting - Architect and Hygiene: +2 Total assistance Toileting - Clothing Manipulation and Hygiene: Patient Percentage: 0% (unable to free UEs from RW to simulate) Where Assessed - Toileting Clothing Manipulation and Hygiene: Standing Tub/Shower Transfer Method: Not assessed Equipment Used: Rolling walker ADL Comments: Pt able to pick L LE off the floor to take a step after facilitation of first step forward. Also able to pivot feet slightly to turn but then required chair being pulled up behind her as she cant take backwards steps. Pt with forward flexed  posture and needs cues througout to stand more erect .     OT Diagnosis: Generalized weakness  OT Problem List: Decreased strength;Decreased knowledge of use of DME or AE;Pain OT Treatment Interventions: Self-care/ADL training;Therapeutic activities;DME and/or AE instruction;Patient/family education   OT Goals Acute Rehab OT Goals OT Goal Formulation: With patient Time For Goal Achievement: 09/29/11 Potential to Achieve Goals: Good ADL Goals Pt Will Perform Grooming: with mod assist;Standing at sink ADL Goal: Grooming - Progress: Goal set today Pt Will Perform Lower Body Bathing: with mod assist;Sit to stand from chair;Sit to stand from bed ADL Goal: Lower Body Bathing - Progress: Goal set today Pt Will Perform Lower Body Dressing: with mod assist;Sit to stand from chair;Sit to stand from bed ADL Goal: Lower Body Dressing - Progress: Goal set today Pt Will Transfer to Toilet: with mod assist;Stand pivot transfer;with DME ADL Goal: Toilet Transfer - Progress: Goal set today Pt Will Perform Toileting - Clothing Manipulation: with mod assist;Standing ADL Goal: Toileting - Clothing Manipulation - Progress: Goal set today Pt Will Perform Toileting - Hygiene: with mod assist;Sit to stand from 3-in-1/toilet ADL Goal: Toileting - Hygiene - Progress: Goal set  today  Visit Information  Last OT  Received On: 09/22/11 Assistance Needed: +2    Subjective Data  Subjective: I can try Patient Stated Goal: to be able to move better   Prior Functioning  Home Living Lives With: Spouse Available Help at Discharge: Skilled Nursing Facility Additional Comments: Pt plans to d/c to SNF. 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, HOH in left) Dominant Hand: Right    Cognition  Overall Cognitive Status: Appears within functional limits for tasks assessed/performed Arousal/Alertness: Awake/alert Orientation Level: Appears intact for tasks assessed Behavior During Session: [name] for tasks performed    Extremity/Trunk Assessment Right Upper Extremity Assessment RUE ROM/Strength/Tone: [facility] for tasks assessed Left Upper Extremity Assessment LUE ROM/Strength/Tone: WFL for tasks assessed   Mobility Bed Mobility Supine to Sit: 1: +2 Total assist Supine to Sit: Patient Percentage: 60% Details for Bed Mobility Assistance: assist to bring trunk upright and assist to support and bring L LE over to EOB.  Transfers Transfers: Sit to Stand;Stand to Sit Sit to Stand: 1: +2 Total assist;Other (comment);With upper extremity assist;From bed;From elevated surface (for safety) Sit to Stand: Patient Percentage: 60% Stand to Sit: 1: +2 Total assist;Other (comment);With upper extremity assist;To chair/3-in-1 (for safety) Stand to Sit: Patient Percentage: 60% Details for Transfer Assistance: +2 for safety. verbal cues for technique and hand placement. Needs chair pulled up to sit. Unable to step back to chair.    Exercise    Balance    End of Session OT - End of Session Equipment Utilized During Treatment: Gait belt Activity Tolerance: Patient tolerated treatment well Patient left: in chair;with call bell/phone within reach       Lennox Laity [phone] 09/22/2011, 11:02 AM

## 2011-09-22 NOTE — Progress Notes (Signed)
Physical Therapy Treatment Patient Details Name: [name] MRN: [id number] DOB: 1933-06-19 Today's Date: 09/22/2011 Time: 4010-2725 PT Time Calculation (min): 26 min  PT Assessment / Plan / Recommendation Comments on Treatment Session  Pt able to take 14 steps today with increased time, assist and encouragement.    Follow Up Recommendations  Skilled nursing facility    Barriers to Discharge        Equipment Recommendations  Defer to next venue    Recommendations for Other Services    Frequency     Plan Discharge plan remains appropriate;Frequency remains appropriate    Precautions / Restrictions Precautions Precautions: Knee;Fall Precaution Comments: HOH-speak into left ear Required Braces or Orthoses: Knee Immobilizer - Left Knee Immobilizer - Left: Discontinue once straight leg raise with < 10 degree lag Restrictions Weight Bearing Restrictions: Yes LLE Weight Bearing: Weight bearing as tolerated   Pertinent Vitals/Pain 1/10 pain in L knee, ice applied, pt premedicated    Mobility  Bed Mobility Supine to Sit: 1: +2 Total assist Supine to Sit: Patient Percentage: 60% Details for Bed Mobility Assistance: assist to bring trunk upright and assist to support and bring L LE over to EOB.  Transfers Transfers: Stand to Sit;Sit to Stand Sit to Stand: 1: +2 Total assist;With upper extremity assist;From chair/3-in-1 Sit to Stand: Patient Percentage: 60% Stand to Sit: To chair/3-in-1;1: +2 Total assist;With upper extremity assist Stand to Sit: Patient Percentage: 60% Details for Transfer Assistance: +2 for safety, verbal cues for technique and assist for weakness and bringing body weight forward Ambulation/Gait Ambulation/Gait Assistance: 1: +2 Total assist Ambulation/Gait: Patient Percentage: 50% Ambulation Distance (Feet):  (14 steps) Assistive device: Rolling walker Ambulation/Gait Assistance Details: increased time for weight shifting and advancing LEs, constant verbal  cues for sequence and safe technique, encouraged increased use of upper body through RW to assist with pain in L LE. Gait Pattern: Step-to pattern;Antalgic Gait velocity: very very very slow    Exercises     PT Diagnosis:    PT Problem List:   PT Treatment Interventions:     PT Goals Acute Rehab PT Goals PT Goal: Sit to Stand - Progress: Progressing toward goal PT Goal: Stand to Sit - Progress: Progressing toward goal PT Goal: Ambulate - Progress: Progressing toward goal  Visit Information  Last PT Received On: 09/22/11 Assistance Needed: +2    Subjective Data  Subjective: "I did better getting to the chair this morning."   Cognition  Overall Cognitive Status: Appears within functional limits for tasks assessed/performed Arousal/Alertness: Awake/alert Orientation Level: Appears intact for tasks assessed Behavior During Session: [facility] for tasks performed    Balance     End of Session PT - End of Session Equipment Utilized During Treatment: Gait belt;Left knee immobilizer Activity Tolerance: Patient limited by pain;Patient limited by fatigue Patient left: in chair;with call bell/phone within reach   GP     [facility] E 09/22/2011, 12:09 PM Pager: [phone]

## 2011-09-23 DIAGNOSIS — E876 Hypokalemia: Secondary | ICD-10-CM

## 2011-09-23 LAB — URINALYSIS, DIPSTICK ONLY
Bilirubin Urine: NEGATIVE
Glucose, UA: NEGATIVE mg/dL
Ketones, ur: NEGATIVE mg/dL
Leukocytes, UA: NEGATIVE
Nitrite: NEGATIVE
Protein, ur: NEGATIVE mg/dL
Specific Gravity, Urine: 1.007 (ref 1.005–1.030)
Urobilinogen, UA: 0.2 mg/dL (ref 0.0–1.0)
pH: 6.5 (ref 5.0–8.0)

## 2011-09-23 LAB — BASIC METABOLIC PANEL
BUN: 9 mg/dL (ref 6–23)
CO2: 25 mEq/L (ref 19–32)
Calcium: 8.3 mg/dL — ABNORMAL LOW (ref 8.4–10.5)
Chloride: 95 mEq/L — ABNORMAL LOW (ref 96–112)
Creatinine, Ser: 0.68 mg/dL (ref 0.50–1.10)
GFR calc Af Amer: >90 mL/min (ref >90)
GFR calc non Af Amer: 83 mL/min — ABNORMAL LOW (ref >90)
Glucose, Bld: 101 mg/dL — ABNORMAL HIGH (ref 70–99)
Potassium: 2.9 mEq/L — ABNORMAL LOW (ref 3.5–5.1)
Sodium: 130 mEq/L — ABNORMAL LOW (ref 135–145)

## 2011-09-23 LAB — CBC
HCT: 26.3 % — ABNORMAL LOW (ref 36.0–46.0)
Hemoglobin: 9 g/dL — ABNORMAL LOW (ref 12.0–15.0)
MCH: 28.7 pg (ref 26.0–34.0)
MCHC: 34.2 g/dL (ref 30.0–36.0)
MCV: 83.8 fL (ref 78.0–100.0)
Platelets: 185 10*3/uL (ref 150–400)
RBC: 3.14 MIL/uL — ABNORMAL LOW (ref 3.87–5.11)
RDW: 14.1 % (ref 11.5–15.5)
WBC: 8.2 10*3/uL (ref 4.0–10.5)

## 2011-09-23 LAB — PROTIME-INR
INR: 2.12 — ABNORMAL HIGH (ref 0.00–1.49)
Prothrombin Time: 24.1 seconds — ABNORMAL HIGH (ref 11.6–15.2)

## 2011-09-23 MED ORDER — AZELASTINE HCL 0.1 % NA SOLN: 2.0000 | Freq: Two times a day (BID) | NASAL | Status: DC | PRN

## 2011-09-23 MED ORDER — BISACODYL 10 MG RE SUPP: 10.0000 mg | Freq: Every day | RECTAL | Status: AC | PRN

## 2011-09-23 MED ORDER — POTASSIUM CHLORIDE CRYS ER 20 MEQ PO TBCR
40.0000 meq | EXTENDED_RELEASE_TABLET | ORAL | Status: DC
  Administered 2011-09-23: 40 meq via ORAL
  Filled 2011-09-23 (×3): qty 2

## 2011-09-23 MED ORDER — POLYSACCHARIDE IRON COMPLEX 150 MG PO CAPS: 150.0000 mg | ORAL_CAPSULE | Freq: Every day | ORAL | Status: DC

## 2011-09-23 MED ORDER — POLYETHYLENE GLYCOL 3350 17 G PO PACK: 17.0000 g | PACK | Freq: Every day | ORAL | Status: AC | PRN

## 2011-09-23 MED ORDER — OXYCODONE HCL 5 MG PO TABS: 5.0000 mg | ORAL_TABLET | ORAL | Status: AC | PRN

## 2011-09-23 MED ORDER — ACETAMINOPHEN 325 MG PO TABS: 650.0000 mg | ORAL_TABLET | Freq: Four times a day (QID) | ORAL | Status: DC | PRN

## 2011-09-23 MED ORDER — ONDANSETRON HCL 4 MG PO TABS: 4.0000 mg | ORAL_TABLET | Freq: Four times a day (QID) | ORAL | Status: AC | PRN

## 2011-09-23 MED ORDER — FLUTICASONE PROPIONATE 50 MCG/ACT NA SUSP: 2.0000 | Freq: Every day | NASAL | Status: DC | PRN

## 2011-09-23 MED ORDER — METHOCARBAMOL 500 MG PO TABS: 500.0000 mg | ORAL_TABLET | Freq: Four times a day (QID) | ORAL | Status: AC | PRN

## 2011-09-23 MED ORDER — DSS 100 MG PO CAPS: 100.0000 mg | ORAL_CAPSULE | Freq: Two times a day (BID) | ORAL | Status: AC

## 2011-09-23 MED ORDER — BENZONATATE 100 MG PO CAPS: 100.0000 mg | ORAL_CAPSULE | Freq: Three times a day (TID) | ORAL | Status: AC | PRN

## 2011-09-23 MED ORDER — WARFARIN SODIUM 5 MG PO TABS: 5.0000 mg | ORAL_TABLET | Freq: Every day | ORAL | Status: DC

## 2011-09-23 NOTE — Discharge Summary (Signed)
Physician Discharge Summary   Patient ID: [name] MRN: [id number] DOB/AGE: 08/21/1933 78 y.o.  Admit date: 09/20/2011 Discharge date: 09/23/2011  Primary Diagnosis: Osteoarthritis Left Knee   Admission Diagnoses:  Past Medical History  Diagnosis Date  . Hypertension   . GERD (gastroesophageal reflux disease)   . Irritable bowel   . Cancer     HX BREAST CANCER  . Stroke     HX OF TIA-NO RESIDUAL PROBLEM--PARALYSIS RT SIDE FACE /PT'S MOUTH DROOPS-AND LOSS OF HEARING RT EAR --SINCE EAR SURGERY AS A CHILD   . Dysrhythmia     ATRIAL FIB - CHRONIC COUMADIN  . Lymphedema     HX OF - IN LEFT ARM--NO NEEDLES OR B/P'S LEFT ARM  . UTI (lower urinary tract infection)     FREQUENT  . Macular degeneration     "BEGINNINGS OF" MACULAR DEGENERATION  . Hearing impaired    Discharge Diagnoses:   Principal Problem:  *OA (osteoarthritis) of knee Active Problems:  Postop Acute blood loss anemia  Postop Transfusion  Postop Hyponatremia  Postop Hypokalemia  Procedure:  Procedure(s) (LRB): TOTAL KNEE ARTHROPLASTY (Left)   Consults: None  HPI: Cyrena Kuchenbecker is a 78 y.o. year old female with end stage OA of her left knee with progressively worsening pain and dysfunction. She has constant pain, with activity and at rest and significant functional deficits with difficulties even with ADLs. She has had extensive non-op management including analgesics, injections of cortisone, and home exercise program, but remains in significant pain with significant dysfunction. Radiographs show bone on bone arthritis lateral and patellofemoral with valgus deformity and tibial sclerosis. She presents now for left Total Knee Arthroplasty.       Laboratory Data:    Date: 06/14 0700 - 06/15 0659   Time: 1349 1431 1500 1507    CHEM PROFILE   Sodium   131     Potassium   3.6     Chloride   94     CO2   27     BUN   18     Creatinine, Ser   0.87     Calcium   9.1     GFR calc non Af Amer   63     GFR  calc Af Amer   73      Glucose, Bld   103     Alkaline Phosphatase   71     Albumin   3.9     AST   13     ALT   11     Total Protein   7.2     Total Bilirubin   0.3     CBC    WBC   5.0     RBC   3.75     Hemoglobin   10.4     HCT   31.7     MCV   84.5     MCH   27.7     MCHC   32.8     RDW   13.8     Platelets   362     PROTIME W/ INR    Prothrombin Time   34.1     INR   3.31     PTT    aPTT   58      DIABETES    Glucose, Bld   103     URINALYSIS    Color, Urine  YELLOW      APPearance  CLEAR  Specific Gravity, Urine  1.011      pH  7.0      Glucose, UA  NEGATIVE      Bilirubin Urine  NEGATIVE      Ketones, ur  NEGATIVE      Protein, ur  NEGATIVE      Urobilinogen, UA  0.2      Nitrite  NEGATIVE      Leukocytes, UA  NEGATIVE      Hgb urine dipstick  TRACE      RBC / HPF  0-2      Squamous Epithelial / LPF  RARE        Hospital Outpatient Visit on 09/10/2011  Component Date Value Range Status  . Specimen Description 09/10/2011 NOSE   Final  . Special Requests 09/10/2011 NONE   Final  . Culture 09/10/2011    Final                   Value:NO STAPHYLOCOCCUS AUREUS ISOLATED                         Note: No MRSA Isolated  . Report Status 09/10/2011 09/13/2011 FINAL   Final    Basename 09/23/11 0423 09/22/11 0521 09/21/11 0425  HGB 9.0* 9.7* 6.8*    Basename 09/23/11 0423 09/22/11 0521  WBC 8.2 10.6*  RBC 3.14* 3.41*  HCT 26.3* 28.5*  PLT 185 215    Basename 09/23/11 0423 09/22/11 0521  NA 130* 131*  K 2.9* 3.5  CL 95* 97  CO2 25 25  BUN 9 11  CREATININE 0.68 0.79  GLUCOSE 101* 124*  CALCIUM 8.3* 8.6    Basename 09/23/11 0423 09/22/11 0521  LABPT -- --  INR 2.12* 1.78*    X-Rays:Dg Chest 2 View  09/10/2011  *RADIOLOGY REPORT*  Clinical Data: Preoperative respiratory exam for total knee replacement.  Hypertension.  Atrial fibrillation.  CHEST - 2 VIEW  Comparison: 08/18/2010 and multiple previous  Findings: Heart size is normal.  The  aorta shows calcification and unfolding.  There is chronic bilateral hilar prominence.  Lungs show linear scars bilaterally.  No sign of active infiltrate, mass, effusion or collapse.  Ordinary degenerative changes effect the spine.  IMPRESSION: Atherosclerosis of the aorta.  Chronic bilateral hilar prominence suggesting pulmonary arterial hypertension.  Chronic linear pulmonary scarring.  Original Report Authenticated By: Thomasenia Sales, M.D.    EKG: Orders placed during the hospital encounter of 09/10/11  . EKG 12-LEAD  . EKG 12-LEAD     Hospital Course: Patient was admitted to [facility] and taken to the OR and underwent the above state procedure without complications.  Patient tolerated the procedure well and was later transferred to the recovery room and then to the orthopaedic floor for postoperative care.  They were given PO and IV analgesics for pain control following their surgery.  They were given 24 hours of postoperative antibiotics and started on DVT prophylaxis in the form of Lovenox and Coumadin.   PT and OT were ordered for total joint protocol.  Discharge planning consulted to help with postop disposition and equipment needs.  Patient had a decent night on the evening of surgery and started to get up OOB with therapy on day one.  PCA Morphine was discontinued and they were weaned over to PO meds.  Hemovac drain was pulled without difficulty.  Continued to work with therapy into day two but only walking short distances.  Patient wanted to look into SNF so we got the social worker involved after surgery.  Dressing was changed on day two and the incision was healing well.  On the evening of day two, patient had some difficulty voiding later that evening after the foley came out.  She required in and out cath.  By day three, the patient was seen in rounds by Dr. Lequita Halt and noted to have low potassium.  She was given oral supplements.  It was discussed concernig the urinary retention  and as long as that improved, she would be transferred over to the SNF later today.  Incision was healing well.  Patient was seen in rounds. Husband at bedside.  Discharge Medications: Prior to Admission medications   Medication Sig Start Date End Date Taking? Authorizing Provider  chlorthalidone (HYGROTON) 50 MG tablet Take 25 mg by mouth daily.    Yes Historical Provider, MD  diphenhydramine-acetaminophen (TYLENOL PM EXTRA STRENGTH) 25-500 MG TABS Take 2 tablets by mouth at bedtime as needed. Sleep/pain   Yes Historical Provider, MD  loperamide (IMODIUM) 2 MG capsule Take 1-4 mg by mouth as needed. diarrhea   Yes Historical Provider, MD  losartan (COZAAR) 100 MG tablet Take 100 mg by mouth daily.   Yes Historical Provider, MD  metoprolol succinate (TOPROL-XL) 25 MG 24 hr tablet Take 25 mg by mouth 2 (two) times daily.    Yes Historical Provider, MD  nitrofurantoin (MACRODANTIN) 100 MG capsule Take 100 mg by mouth at bedtime.   Yes Historical Provider, MD  pantoprazole (PROTONIX) 40 MG tablet Take 40 mg by mouth 2 (two) times daily.   Yes Historical Provider, MD  acetaminophen (TYLENOL) 325 MG tablet Take 2 tablets (650 mg total) by mouth every 6 (six) hours as needed (or Fever >/= 101). 09/23/11 09/22/12  Alexzandrew Perkins, PA  azelastine (ASTELIN) 137 MCG/SPRAY nasal spray Place 2 sprays into the nose 2 (two) times daily as needed. Use in each nostril as directed 09/23/11 09/22/12  Alexzandrew Julien Girt, PA  benzonatate (TESSALON) 100 MG capsule Take 1 capsule (100 mg total) by mouth 3 (three) times daily as needed for cough. 09/23/11 09/30/11  Alexzandrew Perkins, PA  bisacodyl (DULCOLAX) 10 MG suppository Place 1 suppository (10 mg total) rectally daily as needed. 09/23/11 10/03/11  Alexzandrew Perkins, PA  docusate sodium 100 MG CAPS Take 100 mg by mouth 2 (two) times daily. 09/23/11 10/03/11  Alexzandrew Perkins, PA  fluticasone (FLONASE) 50 MCG/ACT nasal spray Place 2 sprays into the nose daily as needed  for allergies or rhinitis. 09/23/11 09/22/12  Alexzandrew Perkins, PA  iron polysaccharides (NIFEREX) 150 MG capsule Take 1 capsule (150 mg total) by mouth daily. Take for three weeks and then discontinue. 09/23/11 09/22/12  Alexzandrew Perkins, PA  methocarbamol (ROBAXIN) 500 MG tablet Take 1 tablet (500 mg total) by mouth every 6 (six) hours as needed. 09/23/11 10/03/11  Alexzandrew Perkins, PA  ondansetron (ZOFRAN) 4 MG tablet Take 1 tablet (4 mg total) by mouth every 6 (six) hours as needed for nausea. 09/23/11 09/30/11  Alexzandrew Perkins, PA  oxyCODONE (OXY IR/ROXICODONE) 5 MG immediate release tablet Take 1-2 tablets (5-10 mg total) by mouth every 4 (four) hours as needed for pain. 09/23/11 10/03/11  Alexzandrew Perkins, PA  polyethylene glycol (MIRALAX / GLYCOLAX) packet Take 17 g by mouth daily as needed. 09/23/11 09/26/11  Alexzandrew Perkins, PA  warfarin (COUMADIN) 5 MG tablet Take 1 tablet (5 mg total) by mouth daily. Coumadin for three weeks for postop protocol,  then patient may resume previous Coumadin home regimen.  Dose may need to be adjusted based upon the INR.  Follow the INR, titrate Coumadin dose for therapeutic range between 2.0 and 3.0 INR.  After completing three weeks of Coumadin, the patient may resume their previous Coumadin home regimen. 09/23/11   Alexzandrew Julien Girt, PA    Diet: Cardiac diet Activity:WBAT Follow-up:in 2 weeks Disposition - Skilled nursing facility Discharged Condition: stable  Follow-up Labs:  CBC and BMET on Friday.  Will ask the facility to draw to follow up and her K+ level and HGB level.   Discharge Orders    Future Orders Please Complete By Expires   Diet - low sodium heart healthy      Diet Carb Modified      Call MD / Call 911      Comments:   If you experience chest pain or shortness of breath, CALL 911 and be transported to the hospital emergency room.  If you develope a fever above 101 F, pus (white drainage) or increased drainage or redness at the  wound, or calf pain, call your surgeon's office.   Discharge instructions      Comments:   Pick up stool softner and laxative for home. Do not submerge incision under water. May shower. Continue to use ice for pain and swelling from surgery.  Take Coumadin for three weeks for postoperative protocol and then the patient may resume their previous Coumadin home regimen.  The dose may need to be adjusted based upon the INR.  Please follow the INR and titrate Coumadin dose for a therapeutic range between 2.0 and 3.0 INR.  After completing the three weeks of Coumadin, the patient may resume their previous Coumadin home regimen.  When discharged from the skilled rehab facility, please have the facility set up the patient's Home Health Physical Therapy prior to being released.  Also provide the patient with their medications at time of release from the facility to include their pain medication, the muscle relaxants, and their blood thinner medication.  If the patient is still at the rehab facility at time of follow up appointment, please also assist the patient in arranging follow up appointment in our office and any transportation needs.   Constipation Prevention      Comments:   Drink plenty of fluids.  Prune juice may be helpful.  You may use a stool softener, such as Colace (over the counter) 100 mg twice a day.  Use MiraLax (over the counter) for constipation as needed.   Increase activity slowly as tolerated      Patient may shower      Comments:   You may shower without a dressing once there is no drainage.  Do not wash over the wound.  If drainage remains, do not shower until drainage stops.   Driving restrictions      Comments:   No driving until released by the physician.   Lifting restrictions      Comments:   No lifting until released by the physician.   TED hose      Comments:   Use stockings (TED hose) for 3 weeks on both leg(s).  You may remove them at night for sleeping.   Change  dressing      Comments:   Change dressing daily with sterile 4 x 4 inch gauze dressing and apply TED hose. Do not submerge the incision under water.   Do not put a pillow under the knee. Place  it under the heel.      Do not sit on low chairs, stoools or toilet seats, as it may be difficult to get up from low surfaces        Medication List  As of 09/23/2011  7:43 AM   STOP taking these medications         ALIGN 4 MG Caps      multivitamin with minerals Tabs      Ocuvite PreserVision Tabs      saccharomyces boulardii 250 MG capsule         TAKE these medications         acetaminophen 325 MG tablet   Commonly known as: TYLENOL   Take 2 tablets (650 mg total) by mouth every 6 (six) hours as needed (or Fever >/= 101).      azelastine 137 MCG/SPRAY nasal spray   Commonly known as: ASTELIN   Place 2 sprays into the nose 2 (two) times daily as needed. Use in each nostril as directed      benzonatate 100 MG capsule   Commonly known as: TESSALON   Take 1 capsule (100 mg total) by mouth 3 (three) times daily as needed for cough.      bisacodyl 10 MG suppository   Commonly known as: DULCOLAX   Place 1 suppository (10 mg total) rectally daily as needed.      chlorthalidone 50 MG tablet   Commonly known as: HYGROTON   Take 25 mg by mouth daily.      DSS 100 MG Caps   Take 100 mg by mouth 2 (two) times daily.      fluticasone 50 MCG/ACT nasal spray   Commonly known as: FLONASE   Place 2 sprays into the nose daily as needed for allergies or rhinitis.      iron polysaccharides 150 MG capsule   Commonly known as: NIFEREX   Take 1 capsule (150 mg total) by mouth daily. Take for three weeks and then discontinue.      loperamide 2 MG capsule   Commonly known as: IMODIUM   Take 1-4 mg by mouth as needed. diarrhea      losartan 100 MG tablet   Commonly known as: COZAAR   Take 100 mg by mouth daily.      methocarbamol 500 MG tablet   Commonly known as: ROBAXIN   Take 1 tablet  (500 mg total) by mouth every 6 (six) hours as needed.      metoprolol succinate 25 MG 24 hr tablet   Commonly known as: TOPROL-XL   Take 25 mg by mouth 2 (two) times daily.      nitrofurantoin 100 MG capsule   Commonly known as: MACRODANTIN   Take 100 mg by mouth at bedtime.      ondansetron 4 MG tablet   Commonly known as: ZOFRAN   Take 1 tablet (4 mg total) by mouth every 6 (six) hours as needed for nausea.      oxyCODONE 5 MG immediate release tablet   Commonly known as: Oxy IR/ROXICODONE   Take 1-2 tablets (5-10 mg total) by mouth every 4 (four) hours as needed for pain.      pantoprazole 40 MG tablet   Commonly known as: PROTONIX   Take 40 mg by mouth 2 (two) times daily.      polyethylene glycol packet   Commonly known as: MIRALAX / GLYCOLAX   Take 17 g by mouth daily as needed.  TYLENOL PM EXTRA STRENGTH 25-500 MG Tabs   Generic drug: diphenhydramine-acetaminophen   Take 2 tablets by mouth at bedtime as needed. Sleep/pain      warfarin 5 MG tablet   Commonly known as: COUMADIN   Take 1 tablet (5 mg total) by mouth daily. Coumadin for three weeks for postop protocol, then patient may resume previous Coumadin home regimen.  Dose may need to be adjusted based upon the INR.  Follow the INR, titrate Coumadin dose for therapeutic range between 2.0 and 3.0 INR.  After completing three weeks of Coumadin, the patient may resume their previous Coumadin home regimen.           Follow-up Information    Follow up with Loanne Drilling, MD. Schedule an appointment as soon as possible for a visit in 2 weeks.   Contact information:   [facility] [address] [place] [address] [phone]          Signed: Patrica Duel 09/23/2011, 7:43 AM

## 2011-09-23 NOTE — Progress Notes (Signed)
Spoke with Selena Batten at [employer]. Transportation  #[id number].  Rosely Fernandez C. Joncarlos Atkison MSW, LCSW [phone]

## 2011-09-23 NOTE — Discharge Instructions (Signed)
Dr. Ollen Gross Total Joint Specialist [facility] [address], [address] [place], [place] [address] [phone]  TOTAL KNEE REPLACEMENT POSTOPERATIVE DIRECTIONS    Knee Rehabilitation, Guidelines Following Surgery  Results after knee surgery are often greatly improved when you follow the exercise, range of motion and muscle strengthening exercises prescribed by your doctor. Safety measures are also important to protect the knee from further injury. Any time any of these exercises cause you to have increased pain or swelling in your knee joint, decrease the amount until you are comfortable again and slowly increase them. If you have problems or questions, call your caregiver or physical therapist for advice.   HOME CARE INSTRUCTIONS  Remove items at home which could result in a fall. This includes throw rugs or furniture in walking pathways.  Continue medications as instructed at time of discharge. You may have some home medications which will be placed on hold until you complete the course of blood thinner medication.  You may start showering once you are discharged home but do not submerge the incision under water. Just pat the incision dry and apply a dry gauze dressing on daily. Walk with walker as instructed.   Use walker as long as suggested by your caregivers.  Avoid periods of inactivity such as sitting longer than an hour when not asleep. This helps prevent blood clots.  You may put full weight on your legs and walk as much as is comfortable.  You may resume a sexual relationship in one month or when given the OK by your doctor.  You may return to work once you are cleared by your doctor.  Do not drive a car for 6 weeks or until released by you surgeon.   Do not drive while taking narcotics.  Wear the elastic stockings for three weeks following surgery during the day but you may remove then at night. Make sure you keep all of your appointments after your  operation with all of your doctors and caregivers. You should call the office at the above phone number and make an appointment for approximately two weeks after the date of your surgery. Change the dressing daily and reapply a dry dressing each time. Please pick up a stool softener and laxative for home use as long as you are requiring pain medications.  Continue to use ice on the knee for pain and swelling from surgery. You may notice swelling that will progress down to the foot and ankle.  This is normal after surgery.  Elevate the leg when you are not up walking on it.   It is important for you to complete the blood thinner medication as prescribed by your doctor.  Continue to use the breathing machine which will help keep your temperature down.  It is common for your temperature to cycle up and down following surgery, especially at night when you are not up moving around and exerting yourself.  The breathing machine keeps your lungs expanded and your temperature down.  RANGE OF MOTION AND STRENGTHENING EXERCISES  Rehabilitation of the knee is important following a knee injury or an operation. After just a few days of immobilization, the muscles of the thigh which control the knee become weakened and shrink (atrophy). Knee exercises are designed to build up the tone and strength of the thigh muscles and to improve knee motion. Often times heat used for twenty to thirty minutes before working out will loosen up your tissues and help with improving the range  of motion but do not use heat for the first two weeks following surgery. These exercises can be done on a training (exercise) mat, on the floor, on a table or on a bed. Use what ever works the best and is most comfortable for you Knee exercises include:  Leg Lifts - While your knee is still immobilized in a splint or cast, you can do straight leg raises. Lift the leg to 60 degrees, hold for 3 sec, and slowly lower the leg. Repeat 10-20 times 2-3  times daily. Perform this exercise against resistance later as your knee gets better.  Quad and Hamstring Sets - Tighten up the muscle on the front of the thigh (Quad) and hold for 5-10 sec. Repeat this 10-20 times hourly. Hamstring sets are done by pushing the foot backward against an object and holding for 5-10 sec. Repeat as with quad sets.  A rehabilitation program following serious knee injuries can speed recovery and prevent re-injury in the future due to weakened muscles. Contact your doctor or a physical therapist for more information on knee rehabilitation.   SKILLED REHAB INSTRUCTIONS: If the patient is transferred to a skilled rehab facility following release from the hospital, a list of the current medications will be sent to the facility for the patient to continue.  When discharged from the skilled rehab facility, please have the facility set up the patient's Home Health Physical Therapy prior to being released. Also, the skilled facility will be responsible for providing the patient with their medications at time of release from the facility to include their pain medication, the muscle relaxants, and their blood thinner medication. If the patient is still at the rehab facility at time of the two week follow up appointment, the skilled rehab facility will also need to assist the patient in arranging follow up appointment in our office and any transportation needs.  MAKE SURE YOU:  Understand these instructions.  Will watch your condition.  Will get help right away if you are not doing well or get worse.  Document Released: 03/15/2005 Document Revised: 03/04/2011 Document Reviewed: 09/02/2006  [facility] Patient Information 2012 [place], [place].   Pick up stool softner and laxative for home. Do not submerge incision under water. May shower. Continue to use ice for pain and swelling from surgery.  Take Coumadin for three weeks for postoperative protocol and then the patient may resume  their previous Coumadin home regimen.  The dose may need to be adjusted based upon the INR.  Please follow the INR and titrate Coumadin dose for a therapeutic range between 2.0 and 3.0 INR.  After completing the three weeks of Coumadin, the patient may resume their previous Coumadin home regimen.  When discharged from the skilled rehab facility, please have the facility set up the patient's Home Health Physical Therapy prior to being released.  Also provide the patient with their medications at time of release from the facility to include their pain medication, the muscle relaxants, and their blood thinner medication.  If the patient is still at the rehab facility at time of follow up appointment, please also assist the patient in arranging follow up appointment in our office and any transportation needs.

## 2011-09-23 NOTE — Progress Notes (Signed)
Physical Therapy Treatment Patient Details Name: Storm Dulski MRN: [id number] DOB: 12-Mar-1934 Today's Date: 09/23/2011 Time: 4098-1191 PT Time Calculation (min): 25 min  PT Assessment / Plan / Recommendation Comments on Treatment Session  Pt did better with ambulation today however still requiring increased time, assist, and short distance.  Plans to d/c to SNF today.  Pt maintained on 2L oxygen throughout session.    Follow Up Recommendations  Skilled nursing facility    Barriers to Discharge        Equipment Recommendations  Defer to next venue    Recommendations for Other Services    Frequency     Plan Discharge plan remains appropriate;Frequency remains appropriate    Precautions / Restrictions Precautions Precautions: Knee;Fall Precaution Comments: HOH-speak into left ear Required Braces or Orthoses: Knee Immobilizer - Left Knee Immobilizer - Left: Discontinue once straight leg raise with < 10 degree lag Restrictions LLE Weight Bearing: Weight bearing as tolerated   Pertinent Vitals/Pain 3-4/10 L knee pain, RN notified    Mobility  Bed Mobility Bed Mobility: Supine to Sit Supine to Sit: 1: +2 Total assist;With rails;HOB elevated Supine to Sit: Patient Percentage: 60% Details for Bed Mobility Assistance: verbal cues for technique Transfers Transfers: Stand to Sit;Sit to Stand Sit to Stand: 1: +2 Total assist;With upper extremity assist;From chair/3-in-1 Sit to Stand: Patient Percentage: 70% Stand to Sit: To chair/3-in-1;1: +2 Total assist;With upper extremity assist Stand to Sit: Patient Percentage: 70% Details for Transfer Assistance: +2 for safety, verbal cues for technique and assist for weakness and bringing body weight forward Ambulation/Gait Ambulation/Gait Assistance: 1: +2 Total assist Ambulation/Gait: Patient Percentage: 70% (75%) Ambulation Distance (Feet): 15 Feet Assistive device: Rolling walker Ambulation/Gait Assistance Details: pt able to advance  LEs more easily and a little faster today, verbal cues for safety and sequence Gait Pattern: Step-to pattern;Antalgic Gait velocity: decreased    Exercises     PT Diagnosis:    PT Problem List:   PT Treatment Interventions:     PT Goals Acute Rehab PT Goals PT Goal: Supine/Side to Sit - Progress: Progressing toward goal PT Goal: Sit to Stand - Progress: Progressing toward goal PT Goal: Stand to Sit - Progress: Progressing toward goal PT Goal: Ambulate - Progress: Progressing toward goal  Visit Information  Last PT Received On: 09/23/11 Assistance Needed: +2    Subjective Data  Subjective: "I'm supposed to go to rehab today."   Cognition  Overall Cognitive Status: Appears within functional limits for tasks assessed/performed    Balance     End of Session PT - End of Session Equipment Utilized During Treatment: Gait belt;Left knee immobilizer;Oxygen Activity Tolerance: Patient tolerated treatment well Patient left: in chair;with call bell/phone within reach;with family/visitor present Nurse Communication: Patient requests pain meds   GP     Arrionna Serena,KATHrine E 09/23/2011, 10:00 AM Pager: [phone]

## 2011-09-23 NOTE — Progress Notes (Addendum)
Subjective: 3 Days Post-Op Procedure(s) (LRB): TOTAL KNEE ARTHROPLASTY (Left) Patient reports pain as mild.   Patient seen in rounds with Dr. Lequita Halt. Husband in room. Patient is having problems with urinary retention requiring in and out cath last night.  Will see how she does today. Patient's plan is to go to [name] today.  Will see how she does with therapy this morning and also make sure that her voiding improves.  Will set up for transfer but depends upon how she does this morning. Encourage the patient to get up to bathroom or to use a bedside commode.  If her voiding improves, then will transfer to SNF.  Objective: Vital signs in last 24 hours: Temp:  [98.6 F (37 C)-99 F (37.2 C)] 99 F (37.2 C) (06/27 0511) Pulse Rate:  [68-86] 68  (06/27 0511) Resp:  [18-19] 18  (06/27 0511) BP: (138-151)/(55-87) 138/87 mmHg (06/27 0511) SpO2:  [91 %-97 %] 96 % (06/27 0511)  Intake/Output from previous day:  Intake/Output Summary (Last 24 hours) at 09/23/11 0727 Last data filed at 09/23/11 0354  Gross per 24 hour  Intake    695 ml  Output   2195 ml  Net  -1500 ml    Intake/Output this shift:    Labs:  Basename 09/23/11 0423 09/22/11 0521 09/21/11 0425  HGB 9.0* 9.7* 6.8*    Basename 09/23/11 0423 09/22/11 0521  WBC 8.2 10.6*  RBC 3.14* 3.41*  HCT 26.3* 28.5*  PLT 185 215    Basename 09/23/11 0423 09/22/11 0521  NA 130* 131*  K 2.9* 3.5  CL 95* 97  CO2 25 25  BUN 9 11  CREATININE 0.68 0.79  GLUCOSE 101* 124*  CALCIUM 8.3* 8.6    Basename 09/23/11 0423 09/22/11 0521  LABPT -- --  INR 2.12* 1.78*    EXAM: General - Patient is Alert, Appropriate and Oriented Extremity - Neurovascular intact Sensation intact distally Dorsiflexion/Plantar flexion intact Incision - clean, dry, healing Motor Function - intact, moving foot and toes well on exam.   Assessment/Plan: 3 Days Post-Op Procedure(s) (LRB): TOTAL KNEE ARTHROPLASTY (Left) Procedure(s) (LRB): TOTAL KNEE  ARTHROPLASTY (Left) Past Medical History  Diagnosis Date  . Hypertension   . GERD (gastroesophageal reflux disease)   . Irritable bowel   . Cancer     HX BREAST CANCER  . Stroke     HX OF TIA-NO RESIDUAL PROBLEM--PARALYSIS RT SIDE FACE /PT'S MOUTH DROOPS-AND LOSS OF HEARING RT EAR --SINCE EAR SURGERY AS A CHILD   . Dysrhythmia     ATRIAL FIB - CHRONIC COUMADIN  . Lymphedema     HX OF - IN LEFT ARM--NO NEEDLES OR B/P'S LEFT ARM  . UTI (lower urinary tract infection)     FREQUENT  . Macular degeneration     "BEGINNINGS OF" MACULAR DEGENERATION  . Hearing impaired    Principal Problem:  *OA (osteoarthritis) of knee Active Problems:  Postop Acute blood loss anemia  Postop Transfusion  Postop Hyponatremia  Postop Hypokalemia   Up with therapy Discharge to SNF if improved Diet - Cardiac diet Follow up - in 2 weeks Activity - WBAT Disposition - Skilled nursing facility - [facility] Condition Upon Discharge - Stable D/C Meds - See DC Summary DVT Prophylaxis - Coumadin, INR is 2.12.  Patrica Duel Pager [phone]  09/23/2011, 7:27 AM

## 2011-09-24 LAB — URINE CULTURE
Colony Count: NO GROWTH
Culture  Setup Time: 201306270922
Culture: NO GROWTH

## 2011-11-03 ENCOUNTER — Ambulatory Visit: Payer: Medicare Other | Attending: Orthopedic Surgery

## 2011-11-03 DIAGNOSIS — R5381 Other malaise: Secondary | ICD-10-CM | POA: Insufficient documentation

## 2011-11-03 DIAGNOSIS — IMO0001 Reserved for inherently not codable concepts without codable children: Secondary | ICD-10-CM | POA: Insufficient documentation

## 2011-11-03 DIAGNOSIS — R269 Unspecified abnormalities of gait and mobility: Secondary | ICD-10-CM | POA: Insufficient documentation

## 2011-11-03 DIAGNOSIS — M25569 Pain in unspecified knee: Secondary | ICD-10-CM | POA: Insufficient documentation

## 2011-11-04 ENCOUNTER — Ambulatory Visit: Payer: Medicare Other | Admitting: Physical Therapy

## 2011-11-05 ENCOUNTER — Encounter: Payer: Medicare Other | Admitting: Physical Therapy

## 2011-11-08 ENCOUNTER — Ambulatory Visit: Payer: Medicare Other | Admitting: Physical Therapy

## 2011-11-10 ENCOUNTER — Ambulatory Visit: Payer: Medicare Other | Admitting: Physical Therapy

## 2011-11-12 ENCOUNTER — Ambulatory Visit: Payer: Medicare Other

## 2011-11-15 ENCOUNTER — Ambulatory Visit: Payer: Medicare Other

## 2011-11-17 ENCOUNTER — Ambulatory Visit: Payer: Medicare Other | Admitting: Physical Therapy

## 2011-11-19 ENCOUNTER — Ambulatory Visit: Payer: Medicare Other

## 2011-11-22 ENCOUNTER — Ambulatory Visit: Payer: Medicare Other | Admitting: Physical Therapy

## 2011-11-24 ENCOUNTER — Encounter: Payer: Medicare Other | Admitting: Physical Therapy

## 2011-11-26 ENCOUNTER — Ambulatory Visit: Payer: Medicare Other | Admitting: Physical Therapy

## 2011-12-01 ENCOUNTER — Ambulatory Visit: Payer: Medicare Other | Attending: Orthopedic Surgery | Admitting: Physical Therapy

## 2011-12-01 DIAGNOSIS — R5381 Other malaise: Secondary | ICD-10-CM | POA: Insufficient documentation

## 2011-12-01 DIAGNOSIS — IMO0001 Reserved for inherently not codable concepts without codable children: Secondary | ICD-10-CM | POA: Insufficient documentation

## 2011-12-01 DIAGNOSIS — R269 Unspecified abnormalities of gait and mobility: Secondary | ICD-10-CM | POA: Insufficient documentation

## 2011-12-01 DIAGNOSIS — M25569 Pain in unspecified knee: Secondary | ICD-10-CM | POA: Insufficient documentation

## 2011-12-03 ENCOUNTER — Ambulatory Visit: Payer: Medicare Other | Admitting: Physical Therapy

## 2011-12-06 ENCOUNTER — Ambulatory Visit: Payer: Medicare Other | Admitting: Physical Therapy

## 2011-12-07 ENCOUNTER — Ambulatory Visit: Payer: Medicare Other | Admitting: Physical Therapy

## 2011-12-09 ENCOUNTER — Ambulatory Visit: Payer: Medicare Other | Admitting: Physical Therapy

## 2011-12-13 ENCOUNTER — Ambulatory Visit: Payer: Medicare Other | Admitting: Physical Therapy

## 2011-12-15 ENCOUNTER — Encounter: Payer: Medicare Other | Admitting: Physical Therapy

## 2011-12-16 ENCOUNTER — Ambulatory Visit: Payer: Medicare Other | Admitting: Physical Therapy

## 2011-12-20 ENCOUNTER — Encounter: Payer: Medicare Other | Admitting: Physical Therapy

## 2011-12-21 ENCOUNTER — Ambulatory Visit: Payer: Medicare Other

## 2011-12-22 ENCOUNTER — Encounter: Payer: Medicare Other | Admitting: Physical Therapy

## 2011-12-23 ENCOUNTER — Ambulatory Visit: Payer: Medicare Other | Admitting: Physical Therapy

## 2011-12-24 ENCOUNTER — Ambulatory Visit: Payer: Medicare Other | Admitting: Physical Therapy

## 2011-12-28 ENCOUNTER — Ambulatory Visit: Payer: Medicare Other | Attending: Orthopedic Surgery | Admitting: Physical Therapy

## 2011-12-28 DIAGNOSIS — M25569 Pain in unspecified knee: Secondary | ICD-10-CM | POA: Insufficient documentation

## 2011-12-28 DIAGNOSIS — R5381 Other malaise: Secondary | ICD-10-CM | POA: Insufficient documentation

## 2011-12-28 DIAGNOSIS — R269 Unspecified abnormalities of gait and mobility: Secondary | ICD-10-CM | POA: Insufficient documentation

## 2011-12-28 DIAGNOSIS — Z96659 Presence of unspecified artificial knee joint: Secondary | ICD-10-CM | POA: Insufficient documentation

## 2011-12-28 DIAGNOSIS — IMO0001 Reserved for inherently not codable concepts without codable children: Secondary | ICD-10-CM | POA: Insufficient documentation

## 2011-12-30 ENCOUNTER — Ambulatory Visit: Payer: Medicare Other

## 2012-01-04 ENCOUNTER — Ambulatory Visit: Payer: Medicare Other | Admitting: Physical Therapy

## 2012-01-06 ENCOUNTER — Ambulatory Visit: Payer: Medicare Other | Admitting: Physical Therapy

## 2012-01-11 ENCOUNTER — Ambulatory Visit: Payer: Medicare Other

## 2012-01-13 ENCOUNTER — Ambulatory Visit: Payer: Medicare Other | Admitting: Physical Therapy

## 2012-01-18 ENCOUNTER — Ambulatory Visit: Payer: Medicare Other | Admitting: Physical Therapy

## 2012-01-20 ENCOUNTER — Ambulatory Visit: Payer: Medicare Other | Admitting: Physical Therapy

## 2012-01-25 ENCOUNTER — Ambulatory Visit: Payer: Medicare Other | Admitting: Physical Therapy

## 2012-01-27 ENCOUNTER — Ambulatory Visit: Payer: Medicare Other

## 2012-03-17 ENCOUNTER — Other Ambulatory Visit: Payer: Self-pay | Admitting: Orthopedic Surgery

## 2012-03-17 MED ORDER — DEXAMETHASONE SODIUM PHOSPHATE 10 MG/ML IJ SOLN: 10.0000 mg | Freq: Once | INTRAMUSCULAR | Status: DC

## 2012-03-17 NOTE — Progress Notes (Signed)
Preoperative surgical orders have been place into the Epic hospital system for [name] on 03/17/2012, 2:13 PM  by Patrica Duel for surgery on 04/07/12.  Preop Knee Scope orders including IV Tylenol and IV Decadron as long as there are no contraindications to the above medications. Avel Peace, PA-C

## 2012-04-03 ENCOUNTER — Encounter (HOSPITAL_BASED_OUTPATIENT_CLINIC_OR_DEPARTMENT_OTHER): Payer: Self-pay | Admitting: *Deleted

## 2012-04-03 NOTE — Progress Notes (Addendum)
To [facility] at 0900- [place] ,Pt-inr on arrival- Ekg,CXR in epic -npo after mn- will take protonix, metoprolol with sip water that am.

## 2012-04-06 NOTE — H&P (Signed)
CC- [name] is a 79 y.o. female who presents with left knee pain.  HPI- . Knee Pain: Patient presents with knee pain involving the  left knee. Onset of the symptoms was several months ago. Inciting event: none known. Current symptoms include crepitus sensation. Pain is aggravated by going up and down stairs and rising after sitting.  Patient has had prior knee problems. Evaluation to date: plain films: normal and serial exams after total knee arthroplasty. Treatment to date: PT which was somewhat effective.  Past Medical History  Diagnosis Date  . Hypertension   . GERD (gastroesophageal reflux disease)   . Irritable bowel   . Cancer     HX BREAST CANCER  . Stroke     HX OF TIA-NO RESIDUAL PROBLEM--PARALYSIS RT SIDE FACE /PT'S MOUTH DROOPS-AND LOSS OF HEARING RT EAR --SINCE EAR SURGERY AS A CHILD   . Dysrhythmia     ATRIAL FIB - CHRONIC COUMADIN  . Lymphedema     HX OF - IN LEFT ARM--NO NEEDLES OR B/P'S LEFT ARM  . UTI (lower urinary tract infection)     FREQUENT  . Macular degeneration     "BEGINNINGS OF" MACULAR DEGENERATION  . Hearing impaired   . Chronic UTI     Past Surgical History  Procedure Date  . Breast surgery 1990's    BREAST CANCER-BILMASTECTOMY--AND RT MASTECTOMY FOR PRE-CANCER  . Joint replacement 2006    RT KNEE REPLACEMENT AT [facility] ABOUT 7 YRS AGO  . Multiple right ear surgeries --as a child - including mastoid surgery   . Total knee arthroplasty 09/20/2011    LEFT TOTAL KNEE ARTHROPLASTY;  Surgeon: Loanne Drilling, MD;  Location: [facility];  Service: Orthopedics;  Laterality: Left;    Prior to Admission medications   Medication Sig Start Date End Date Taking? Authorizing Provider  mirabegron ER (MYRBETRIQ) 50 MG TB24 Take by mouth daily.   Yes Historical Provider, MD  acetaminophen (TYLENOL) 325 MG tablet Take 2 tablets (650 mg total) by mouth every 6 (six) hours as needed (or Fever >/= 101). 09/23/11 09/22/12  Alexzandrew Perkins, PA  azelastine (ASTELIN) 137  MCG/SPRAY nasal spray Place 2 sprays into the nose 2 (two) times daily as needed. Use in each nostril as directed 09/23/11 09/22/12  Alexzandrew Julien Girt, PA  chlorthalidone (HYGROTON) 50 MG tablet Take 25 mg by mouth daily.     Historical Provider, MD  diphenhydramine-acetaminophen (TYLENOL PM EXTRA STRENGTH) 25-500 MG TABS Take 2 tablets by mouth at bedtime as needed. Sleep/pain    Historical Provider, MD  fluticasone (FLONASE) 50 MCG/ACT nasal spray Place 2 sprays into the nose daily as needed for allergies or rhinitis. 09/23/11 09/22/12  Alexzandrew Perkins, PA  iron polysaccharides (NIFEREX) 150 MG capsule Take 1 capsule (150 mg total) by mouth daily. Take for three weeks and then discontinue. 09/23/11 09/22/12  Alexzandrew Perkins, PA  loperamide (IMODIUM) 2 MG capsule Take 1-4 mg by mouth as needed. diarrhea    Historical Provider, MD  losartan (COZAAR) 100 MG tablet Take 100 mg by mouth daily.    Historical Provider, MD  metoprolol succinate (TOPROL-XL) 25 MG 24 hr tablet Take 25 mg by mouth 2 (two) times daily.     Historical Provider, MD  nitrofurantoin (MACRODANTIN) 100 MG capsule Take 100 mg by mouth at bedtime.    Historical Provider, MD  pantoprazole (PROTONIX) 40 MG tablet Take 40 mg by mouth 2 (two) times daily.    Historical Provider, MD  warfarin (COUMADIN) 5  MG tablet Take 1 tablet (5 mg total) by mouth daily. Coumadin for three weeks for postop protocol, then patient may resume previous Coumadin home regimen.  Dose may need to be adjusted based upon the INR.  Follow the INR, titrate Coumadin dose for therapeutic range between 2.0 and 3.0 INR.  After completing three weeks of Coumadin, the patient may resume their previous Coumadin home regimen. 09/23/11   Alexzandrew Julien Girt, PA   KNEE EXAM antalgic gait, collateral ligaments intact, crepitus on range of motion with range 0-110  Physical Examination: General appearance - alert, well appearing, and in no distress Mental status - alert,  oriented to person, place, and time Chest - clear to auscultation, no wheezes, rales or rhonchi, symmetric air entry Heart - normal rate, regular rhythm, normal S1, S2, no murmurs, rubs, clicks or gallops Abdomen - soft, nontender, nondistended, no masses or organomegaly Neurological - alert, oriented, normal speech, no focal findings or movement disorder noted   Asessment/Plan--- Left knee hypertrophic synovitis- - Plan left knee arthroscopy with synovectomy. Procedure risks and potential comps discussed with patient who elects to proceed. Goals are decreased pain and increased function with a high likelihood of achieving both

## 2012-04-07 ENCOUNTER — Ambulatory Visit (HOSPITAL_BASED_OUTPATIENT_CLINIC_OR_DEPARTMENT_OTHER)
Admission: RE | Admit: 2012-04-07 | Discharge: 2012-04-07 | Disposition: A | Discharge status: Home / Self Care | Payer: Medicare Other | Source: Ambulatory Visit | Attending: Orthopedic Surgery | Admitting: Orthopedic Surgery

## 2012-04-07 ENCOUNTER — Encounter (HOSPITAL_BASED_OUTPATIENT_CLINIC_OR_DEPARTMENT_OTHER)
Admission: RE | Disposition: A | Discharge status: Home / Self Care | Payer: Self-pay | Source: Ambulatory Visit | Attending: Orthopedic Surgery

## 2012-04-07 ENCOUNTER — Encounter (HOSPITAL_BASED_OUTPATIENT_CLINIC_OR_DEPARTMENT_OTHER): Payer: Self-pay | Admitting: Anesthesiology

## 2012-04-07 ENCOUNTER — Encounter (HOSPITAL_BASED_OUTPATIENT_CLINIC_OR_DEPARTMENT_OTHER): Payer: Self-pay | Admitting: *Deleted

## 2012-04-07 ENCOUNTER — Ambulatory Visit (HOSPITAL_BASED_OUTPATIENT_CLINIC_OR_DEPARTMENT_OTHER): Payer: Medicare Other | Admitting: Anesthesiology

## 2012-04-07 DIAGNOSIS — Z79899 Other long term (current) drug therapy: Secondary | ICD-10-CM | POA: Insufficient documentation

## 2012-04-07 DIAGNOSIS — Z96659 Presence of unspecified artificial knee joint: Secondary | ICD-10-CM | POA: Insufficient documentation

## 2012-04-07 DIAGNOSIS — Z8673 Personal history of transient ischemic attack (TIA), and cerebral infarction without residual deficits: Secondary | ICD-10-CM | POA: Insufficient documentation

## 2012-04-07 DIAGNOSIS — M659 Unspecified synovitis and tenosynovitis, unspecified site: Secondary | ICD-10-CM | POA: Insufficient documentation

## 2012-04-07 DIAGNOSIS — I10 Essential (primary) hypertension: Secondary | ICD-10-CM | POA: Insufficient documentation

## 2012-04-07 DIAGNOSIS — Z853 Personal history of malignant neoplasm of breast: Secondary | ICD-10-CM | POA: Insufficient documentation

## 2012-04-07 DIAGNOSIS — K219 Gastro-esophageal reflux disease without esophagitis: Secondary | ICD-10-CM | POA: Insufficient documentation

## 2012-04-07 HISTORY — PX: KNEE ARTHROSCOPY: SHX127

## 2012-04-07 LAB — PROTIME-INR
INR: 0.98 (ref 0.00–1.49)
Prothrombin Time: 12.9 seconds (ref 11.6–15.2)

## 2012-04-07 LAB — POCT I-STAT, CHEM 8
BUN: 26 mg/dL — ABNORMAL HIGH (ref 6–23)
Calcium, Ion: 1.28 mmol/L (ref 1.13–1.30)
Chloride: 104 mEq/L (ref 96–112)
Creatinine, Ser: 0.9 mg/dL (ref 0.50–1.10)
Glucose, Bld: 97 mg/dL (ref 70–99)
HCT: 34 % — ABNORMAL LOW (ref 36.0–46.0)
Hemoglobin: 11.6 g/dL — ABNORMAL LOW (ref 12.0–15.0)
Potassium: 4 mEq/L (ref 3.5–5.1)
Sodium: 143 mEq/L (ref 135–145)
TCO2: 29 mmol/L (ref 0–100)

## 2012-04-07 SURGERY — ARTHROSCOPY, KNEE
Anesthesia: General | Site: Knee | Laterality: Left | Wound class: Clean

## 2012-04-07 MED ORDER — METHOCARBAMOL 500 MG PO TABS
500.0000 mg | ORAL_TABLET | Freq: Three times a day (TID) | ORAL | Status: AC
  Administered 2012-04-07: 500 mg via ORAL
  Filled 2012-04-07: qty 1

## 2012-04-07 MED ORDER — METHOCARBAMOL 500 MG PO TABS: 500.0000 mg | ORAL_TABLET | Freq: Four times a day (QID) | ORAL | Status: DC

## 2012-04-07 MED ORDER — SODIUM CHLORIDE 0.9 % IR SOLN
Status: DC | PRN
  Administered 2012-04-07: 9000 mL

## 2012-04-07 MED ORDER — ACETAMINOPHEN 10 MG/ML IV SOLN
1000.0000 mg | Freq: Once | INTRAVENOUS | Status: AC
  Administered 2012-04-07: 1000 mg via INTRAVENOUS
  Filled 2012-04-07: qty 100

## 2012-04-07 MED ORDER — OXYCODONE HCL 5 MG/5ML PO SOLN
5.0000 mg | Freq: Once | ORAL | Status: DC | PRN
  Filled 2012-04-07: qty 5

## 2012-04-07 MED ORDER — DEXTROSE 5 % IV SOLN
3.0000 g | INTRAVENOUS | Status: DC
  Filled 2012-04-07: qty 3000

## 2012-04-07 MED ORDER — HYDROMORPHONE HCL PF 1 MG/ML IJ SOLN
0.2500 mg | INTRAMUSCULAR | Status: DC | PRN
  Filled 2012-04-07: qty 1

## 2012-04-07 MED ORDER — HYDROCODONE-ACETAMINOPHEN 5-325 MG PO TABS: 1.0000 | ORAL_TABLET | Freq: Four times a day (QID) | ORAL | Status: DC | PRN

## 2012-04-07 MED ORDER — CHLORHEXIDINE GLUCONATE 4 % EX LIQD
60.0000 mL | Freq: Once | CUTANEOUS | Status: DC
  Filled 2012-04-07: qty 60

## 2012-04-07 MED ORDER — CEFAZOLIN SODIUM-DEXTROSE 2-3 GM-% IV SOLR
2.0000 g | INTRAVENOUS | Status: AC
  Administered 2012-04-07: 2 g via INTRAVENOUS
  Filled 2012-04-07: qty 50

## 2012-04-07 MED ORDER — BUPIVACAINE-EPINEPHRINE 0.25% -1:200000 IJ SOLN
INTRAMUSCULAR | Status: DC | PRN
  Administered 2012-04-07: 20 mL

## 2012-04-07 MED ORDER — SODIUM CHLORIDE 0.9 % IV SOLN
INTRAVENOUS | Status: DC
  Filled 2012-04-07: qty 1000

## 2012-04-07 MED ORDER — ONDANSETRON HCL 4 MG/2ML IJ SOLN
INTRAMUSCULAR | Status: DC | PRN
  Administered 2012-04-07: 4 mg via INTRAVENOUS

## 2012-04-07 MED ORDER — HYDROCODONE-ACETAMINOPHEN 5-325 MG PO TABS
1.0000 | ORAL_TABLET | Freq: Four times a day (QID) | ORAL | Status: AC | PRN
  Administered 2012-04-07: 1 via ORAL
  Filled 2012-04-07: qty 1

## 2012-04-07 MED ORDER — PROPOFOL 10 MG/ML IV BOLUS
INTRAVENOUS | Status: DC | PRN
  Administered 2012-04-07: 200 mg via INTRAVENOUS

## 2012-04-07 MED ORDER — LIDOCAINE HCL (CARDIAC) 20 MG/ML IV SOLN
INTRAVENOUS | Status: DC | PRN
  Administered 2012-04-07: 80 mg via INTRAVENOUS

## 2012-04-07 MED ORDER — OXYCODONE HCL 5 MG PO TABS
5.0000 mg | ORAL_TABLET | Freq: Once | ORAL | Status: DC | PRN
  Filled 2012-04-07: qty 1

## 2012-04-07 MED ORDER — MEPERIDINE HCL 25 MG/ML IJ SOLN
6.2500 mg | INTRAMUSCULAR | Status: DC | PRN
  Filled 2012-04-07: qty 1

## 2012-04-07 MED ORDER — PROMETHAZINE HCL 25 MG/ML IJ SOLN
6.2500 mg | INTRAMUSCULAR | Status: DC | PRN
  Filled 2012-04-07: qty 1

## 2012-04-07 MED ORDER — LACTATED RINGERS IV SOLN
INTRAVENOUS | Status: DC
  Administered 2012-04-07 (×4): via INTRAVENOUS
  Filled 2012-04-07: qty 1000

## 2012-04-07 MED ORDER — FENTANYL CITRATE 0.05 MG/ML IJ SOLN
INTRAMUSCULAR | Status: DC | PRN
  Administered 2012-04-07: 50 ug via INTRAVENOUS
  Administered 2012-04-07: 100 ug via INTRAVENOUS

## 2012-04-07 MED ORDER — ACETAMINOPHEN 10 MG/ML IV SOLN
1000.0000 mg | Freq: Once | INTRAVENOUS | Status: DC | PRN
  Filled 2012-04-07: qty 100

## 2012-04-07 MED ORDER — DEXAMETHASONE SODIUM PHOSPHATE 4 MG/ML IJ SOLN
INTRAMUSCULAR | Status: DC | PRN
  Administered 2012-04-07: 8 mg via INTRAVENOUS

## 2012-04-07 SURGICAL SUPPLY — 47 items
BANDAGE ELASTIC 6 VELCRO ST LF (GAUZE/BANDAGES/DRESSINGS) ×2 IMPLANT
BLADE 4.2CUDA (BLADE) ×2 IMPLANT
BLADE CUDA SHAVER 3.5 (BLADE) IMPLANT
BLADE CUTTER GATOR 3.5 (BLADE) IMPLANT
CANISTER SUCT LVC 12 LTR MEDI- (MISCELLANEOUS) ×2 IMPLANT
CANISTER SUCTION 2500CC (MISCELLANEOUS) IMPLANT
CLOTH BEACON ORANGE TIMEOUT ST (SAFETY) ×2 IMPLANT
DRAPE ARTHROSCOPY W/POUCH 114 (DRAPES) ×2 IMPLANT
DRSG EMULSION OIL 3X3 NADH (GAUZE/BANDAGES/DRESSINGS) ×2 IMPLANT
DRSG PAD ABDOMINAL 8X10 ST (GAUZE/BANDAGES/DRESSINGS) ×1 IMPLANT
DURAPREP 26ML APPLICATOR (WOUND CARE) ×2 IMPLANT
ELECT MENISCUS 165MM 90D (ELECTRODE) IMPLANT
ELECT REM PT RETURN 9FT ADLT (ELECTROSURGICAL)
ELECTRODE REM PT RTRN 9FT ADLT (ELECTROSURGICAL) IMPLANT
GAUZE SPONGE 4X4 12PLY STRL LF (GAUZE/BANDAGES/DRESSINGS) ×1 IMPLANT
GLOVE BIO SURGEON STRL SZ8 (GLOVE) ×1 IMPLANT
GLOVE BIOGEL PI IND STRL 7.0 (GLOVE) IMPLANT
GLOVE BIOGEL PI IND STRL 7.5 (GLOVE) IMPLANT
GLOVE BIOGEL PI IND STRL 8 (GLOVE) IMPLANT
GLOVE BIOGEL PI INDICATOR 7.0 (GLOVE) ×1
GLOVE BIOGEL PI INDICATOR 7.5 (GLOVE) ×1
GLOVE BIOGEL PI INDICATOR 8 (GLOVE) ×1
GLOVE INDICATOR 6.5 STRL GRN (GLOVE) ×1 IMPLANT
GLOVE INDICATOR 8.0 STRL GRN (GLOVE) ×2 IMPLANT
GLOVE SKINSENSE NS SZ6.5 (GLOVE) ×1
GLOVE SKINSENSE NS SZ7.0 (GLOVE) ×1
GLOVE SKINSENSE NS SZ8.0 LF (GLOVE) ×1
GLOVE SKINSENSE STRL SZ6.5 (GLOVE) IMPLANT
GLOVE SKINSENSE STRL SZ7.0 (GLOVE) IMPLANT
GLOVE SKINSENSE STRL SZ8.0 LF (GLOVE) IMPLANT
GOWN PREVENTION PLUS LG XLONG (DISPOSABLE) ×5 IMPLANT
IV NS IRRIG 3000ML ARTHROMATIC (IV SOLUTION) ×4 IMPLANT
KNEE WRAP E Z 3 GEL PACK (MISCELLANEOUS) ×2 IMPLANT
PACK ARTHROSCOPY DSU (CUSTOM PROCEDURE TRAY) ×2 IMPLANT
PACK BASIN DAY SURGERY FS (CUSTOM PROCEDURE TRAY) ×2 IMPLANT
PADDING CAST ABS 4INX4YD NS (CAST SUPPLIES) ×1
PADDING CAST ABS COTTON 4X4 ST (CAST SUPPLIES) ×1 IMPLANT
PADDING CAST COTTON 6X4 STRL (CAST SUPPLIES) ×2 IMPLANT
PENCIL BUTTON HOLSTER BLD 10FT (ELECTRODE) IMPLANT
SET ARTHROSCOPY TUBING (MISCELLANEOUS) ×2
SET ARTHROSCOPY TUBING LN (MISCELLANEOUS) ×1 IMPLANT
SPONGE GAUZE 4X4 12PLY (GAUZE/BANDAGES/DRESSINGS) ×2 IMPLANT
SUT ETHILON 4 0 PS 2 18 (SUTURE) ×2 IMPLANT
TOWEL OR 17X24 6PK STRL BLUE (TOWEL DISPOSABLE) ×2 IMPLANT
WAND 30 DEG SABER W/CORD (SURGICAL WAND) IMPLANT
WAND 90 DEG TURBOVAC W/CORD (SURGICAL WAND) IMPLANT
WATER STERILE IRR 500ML POUR (IV SOLUTION) ×2 IMPLANT

## 2012-04-07 NOTE — Interval H&P Note (Signed)
History and Physical Interval Note:  04/07/2012 11:04 AM  [name]  has presented today for surgery, with the diagnosis of LEFT KNEE HYPERTROPHIC SYNOVITIS  The various methods of treatment have been discussed with the patient and family. After consideration of risks, benefits and other options for treatment, the patient has consented to  Procedure(s) (LRB) with comments: ARTHROSCOPY KNEE (Left) - WITH SYNOVECTOMY as a surgical intervention .  The patient's history has been reviewed, patient examined, no change in status, stable for surgery.  I have reviewed the patient's chart and labs.  Questions were answered to the patient's satisfaction.     Loanne Drilling

## 2012-04-07 NOTE — Brief Op Note (Signed)
04/07/2012  11:46 AM  PATIENT:  [name]  79 y.o. female  PRE-OPERATIVE DIAGNOSIS:  LEFT KNEE HYPERTROPHIC SYNOVITIS  POST-OPERATIVE DIAGNOSIS:  LEFT KNEE HYPERTROPHIC SYNOVITI  PROCEDURE:  Procedure(s) (LRB) with comments: ARTHROSCOPY KNEE (Left) - WITH SYNOVECTOMY  SURGEON:  Surgeon(s) and Role:    * Loanne Drilling, MD - Primary  PHYSICIAN ASSISTANT:   ASSISTANTS: none   ANESTHESIA:   general  LOCAL MEDICATIONS USED:  MARCAINE     COUNTS:  YES  TOURNIQUET:  * Missing tourniquet times found for documented tourniquets in log:  [id number] *  DICTATION: .Other Dictation: Dictation Number [id number]  PLAN OF CARE: Discharge to home after PACU  PATIENT DISPOSITION:  PACU - hemodynamically stable.

## 2012-04-07 NOTE — Transfer of Care (Signed)
Immediate Anesthesia Transfer of Care Note  Patient: [name]  Procedure(s) Performed: Procedure(s) (LRB): ARTHROSCOPY KNEE (Left)  Patient Location: PACU  Anesthesia Type: General  Level of Consciousness: awake, oriented, sedated and patient cooperative  Airway & Oxygen Therapy: Patient Spontanous Breathing and Patient connected to face mask oxygen  Post-op Assessment: Report given to PACU RN and Post -op Vital signs reviewed and stable  Post vital signs: Reviewed and stable  Complications: No apparent anesthesia complications

## 2012-04-07 NOTE — Anesthesia Postprocedure Evaluation (Signed)
Anesthesia Post Note  Patient: [name]  Procedure(s) Performed: Procedure(s) (LRB): ARTHROSCOPY KNEE (Left)  Anesthesia type: General  Patient location: PACU  Post pain: Pain level controlled  Post assessment: Post-op Vital signs reviewed  Last Vitals: BP 162/61  Pulse 61  Temp 36.4 C (Oral)  Resp 18  Ht 5\' 4"  (1.626 m)  Wt 163 lb 5 oz (74.078 kg)  BMI 28.03 kg/m2  SpO2 99%  Post vital signs: Reviewed  Level of consciousness: sedated  Complications: No apparent anesthesia complications

## 2012-04-07 NOTE — Anesthesia Procedure Notes (Signed)
Procedure Name: LMA Insertion Date/Time: 04/07/2012 11:21 AM Performed by: Fran Lowes Pre-anesthesia Checklist: Patient identified, Emergency Drugs available, Suction available and Patient being monitored Patient Re-evaluated:Patient Re-evaluated prior to inductionOxygen Delivery Method: Circle System Utilized Preoxygenation: Pre-oxygenation with 100% oxygen Intubation Type: IV induction Ventilation: Mask ventilation without difficulty LMA: LMA inserted LMA Size: 4.0 Number of attempts: 1 Airway Equipment and Method: bite block Placement Confirmation: positive ETCO2 Tube secured with: Tape Dental Injury: Teeth and Oropharynx as per pre-operative assessment

## 2012-04-07 NOTE — Anesthesia Preprocedure Evaluation (Addendum)
Anesthesia Evaluation  Patient identified by MRN, date of birth, ID band Patient awake    Reviewed: Allergy & Precautions, H&P , NPO status , Patient's Chart, lab work & pertinent test results, reviewed documented beta blocker date and time   Airway Mallampati: II TM Distance: >3 FB Neck ROM: Full    Dental No notable dental hx. (+) Dental Advisory Given   Pulmonary neg pulmonary ROS,  breath sounds clear to auscultation  Pulmonary exam normal       Cardiovascular hypertension, Pt. on medications and Pt. on home beta blockers - Past MI + dysrhythmias Atrial Fibrillation Rhythm:Regular Rate:Normal     Neuro/Psych TIA 2003 TIACVA, No Residual Symptoms negative psych ROS   GI/Hepatic Neg liver ROS, GERD-  Medicated,  Endo/Other  negative endocrine ROS  Renal/GU negative Renal ROS  negative genitourinary   Musculoskeletal negative musculoskeletal ROS (+)   Abdominal   Peds negative pediatric ROS (+)  Hematology negative hematology ROS (+)   Anesthesia Other Findings   Reproductive/Obstetrics negative OB ROS                        Anesthesia Physical Anesthesia Plan  ASA: III  Anesthesia Plan: General   Post-op Pain Management:    Induction: Intravenous  Airway Management Planned: LMA  Additional Equipment:   Intra-op Plan:   Post-operative Plan: Extubation in OR  Informed Consent: I have reviewed the patients History and Physical, chart, labs and discussed the procedure including the risks, benefits and alternatives for the proposed anesthesia with the patient or authorized representative who has indicated his/her understanding and acceptance.   Dental advisory given  Plan Discussed with: CRNA  Anesthesia Plan Comments:         Anesthesia Quick Evaluation                                   Anesthesia Evaluation  Patient identified by MRN, date of birth, ID band Patient  awake    Reviewed: Allergy & Precautions, H&P , NPO status , Patient's Chart, lab work & pertinent test results  Airway Mallampati: II TM Distance: >3 FB Neck ROM: Full    Dental No notable dental hx.    Pulmonary neg pulmonary ROS,  breath sounds clear to auscultation  Pulmonary exam normal       Cardiovascular hypertension, + dysrhythmias Rhythm:Regular Rate:Normal     Neuro/Psych TIA 2003 TIAnegative psych ROS   GI/Hepatic Neg liver ROS, GERD-  ,  Endo/Other  negative endocrine ROS  Renal/GU negative Renal ROS  negative genitourinary   Musculoskeletal negative musculoskeletal ROS (+)   Abdominal   Peds negative pediatric ROS (+)  Hematology negative hematology ROS (+)   Anesthesia Other Findings   Reproductive/Obstetrics negative OB ROS                           Anesthesia Physical Anesthesia Plan  ASA: III  Anesthesia Plan: General   Post-op Pain Management:    Induction: Intravenous  Airway Management Planned:   Additional Equipment:   Intra-op Plan:   Post-operative Plan: Extubation in OR  Informed Consent: I have reviewed the patients History and Physical, chart, labs and discussed the procedure including the risks, benefits and alternatives for the proposed anesthesia with the patient or authorized representative who has indicated his/her understanding and acceptance.   Dental advisory  given  Plan Discussed with: CRNA  Anesthesia Plan Comments: (Discussed r/b general versus spinal and she prefers general.)       Anesthesia Quick Evaluation

## 2012-04-08 NOTE — Op Note (Signed)
NAMEROCHELE, LUECK                ACCOUNT NO.:  [id number]  MEDICAL RECORD NO.:  [id number]  LOCATION:                                 FACILITY:  PHYSICIAN:  Ollen Gross, M.D.    DATE OF BIRTH:  01-10-1934  DATE OF PROCEDURE: DATE OF DISCHARGE:                              OPERATIVE REPORT   PREOPERATIVE DIAGNOSIS:  Hypertrophic synovitis, left knee.  POSTOPERATIVE:  Hypertrophic synovitis, left knee.  PROCEDURE:  Left knee arthroscopy with synovectomy.  SURGEON:  Ollen Gross, M.D.  ASSISTANT:  No assistant.  ANESTHESIA:  General.  ESTIMATED BLOOD LOSS:  None.  DRAINS:  None.  COMPLICATIONS:  None.  CONDITION:  Stable to recovery.  BRIEF CLINICAL NOTE:  Ms. Zobrist is a 79 year old female with a total knee arthroplasty performed several months ago.  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  She has had progressively worsening discomfort presents now for arthroscopy with synovectomy.  PROCEDURE IN DETAIL:  After successful administration of general anesthetic, tourniquet placed high on the left thigh, left lower extremity is prepped and draped in the usual sterile fashion.  Standard superomedial, inferolateral incisions were made, inflow cannula passed superomedial and camera passed inferolateral.  Arthroscopic visualization proceeds.  There was a very large amount of hypertrophic synovial tissue present at the junction of the quadriceps tendon and patellar component which essentially obliterated the suprapatellar pouch.  I then used a knife to make a superolateral portal.  Using combination of a shaver and the ArthroCare device, debrided this tissue down to healthy-appearing tissue.  There was also some tissue in the medial and lateral gutters which is debrided.  Inferiorly, the tissue looked fine.  The components were in good position.  No evidence of any loosening.  The joints  again inspected.  No other abnormal tissue noted. The arthroscopic was removed from the lateral portals, which were closed with interrupted 4-0 nylon.  20 mL of 0.25% Marcaine with epinephrine was injected through the inflow cannula which was removed and that portal closed with nylon.  Incisions were cleaned and dried and a bulky sterile dressing applied.  She was then awakened and transported to recovery in stable condition.     Ollen Gross, M.D.     FA/MEDQ  D:  04/07/2012  T:  04/08/2012  Job:  [id number]

## 2012-04-10 ENCOUNTER — Encounter (HOSPITAL_BASED_OUTPATIENT_CLINIC_OR_DEPARTMENT_OTHER): Payer: Self-pay | Admitting: Orthopedic Surgery

## 2012-08-15 ENCOUNTER — Other Ambulatory Visit (HOSPITAL_COMMUNITY): Payer: Self-pay | Admitting: Urology

## 2012-08-15 DIAGNOSIS — N281 Cyst of kidney, acquired: Secondary | ICD-10-CM

## 2012-09-05 ENCOUNTER — Ambulatory Visit (HOSPITAL_COMMUNITY)
Admission: RE | Admit: 2012-09-05 | Discharge: 2012-09-05 | Disposition: A | Discharge status: Home / Self Care | Payer: Medicare Other | Source: Ambulatory Visit | Attending: Urology | Admitting: Urology

## 2012-09-05 DIAGNOSIS — K838 Other specified diseases of biliary tract: Secondary | ICD-10-CM | POA: Insufficient documentation

## 2012-09-05 DIAGNOSIS — N289 Disorder of kidney and ureter, unspecified: Secondary | ICD-10-CM | POA: Insufficient documentation

## 2012-09-05 DIAGNOSIS — K7689 Other specified diseases of liver: Secondary | ICD-10-CM | POA: Insufficient documentation

## 2012-09-05 DIAGNOSIS — N281 Cyst of kidney, acquired: Secondary | ICD-10-CM | POA: Insufficient documentation

## 2012-09-05 MED ORDER — GADOBENATE DIMEGLUMINE 529 MG/ML IV SOLN
15.0000 mL | Freq: Once | INTRAVENOUS | Status: AC | PRN
  Administered 2012-09-05: 15 mL via INTRAVENOUS

## 2012-09-06 LAB — POCT I-STAT, CHEM 8
BUN: 28 mg/dL — ABNORMAL HIGH (ref 6–23)
Calcium, Ion: 1.15 mmol/L (ref 1.13–1.30)
Chloride: 100 mEq/L (ref 96–112)
Creatinine, Ser: 1 mg/dL (ref 0.50–1.10)
Glucose, Bld: 102 mg/dL — ABNORMAL HIGH (ref 70–99)
HCT: 33 % — ABNORMAL LOW (ref 36.0–46.0)
Hemoglobin: 11.2 g/dL — ABNORMAL LOW (ref 12.0–15.0)
Potassium: 3.9 mEq/L (ref 3.5–5.1)
Sodium: 133 mEq/L — ABNORMAL LOW (ref 135–145)
TCO2: 26 mmol/L (ref 0–100)

## 2012-09-15 ENCOUNTER — Emergency Department (HOSPITAL_COMMUNITY): Payer: Medicare Other

## 2012-09-15 ENCOUNTER — Encounter (HOSPITAL_COMMUNITY): Payer: Self-pay | Admitting: Emergency Medicine

## 2012-09-15 ENCOUNTER — Emergency Department (HOSPITAL_COMMUNITY)
Admission: EM | Admit: 2012-09-15 | Discharge: 2012-09-15 | Disposition: A | Discharge status: Home / Self Care | Payer: Medicare Other | Attending: Emergency Medicine | Admitting: Emergency Medicine

## 2012-09-15 DIAGNOSIS — N390 Urinary tract infection, site not specified: Secondary | ICD-10-CM

## 2012-09-15 DIAGNOSIS — R112 Nausea with vomiting, unspecified: Secondary | ICD-10-CM | POA: Insufficient documentation

## 2012-09-15 DIAGNOSIS — Z91040 Latex allergy status: Secondary | ICD-10-CM | POA: Insufficient documentation

## 2012-09-15 DIAGNOSIS — Z7901 Long term (current) use of anticoagulants: Secondary | ICD-10-CM | POA: Insufficient documentation

## 2012-09-15 DIAGNOSIS — I10 Essential (primary) hypertension: Secondary | ICD-10-CM | POA: Insufficient documentation

## 2012-09-15 DIAGNOSIS — Z859 Personal history of malignant neoplasm, unspecified: Secondary | ICD-10-CM | POA: Insufficient documentation

## 2012-09-15 DIAGNOSIS — R55 Syncope and collapse: Secondary | ICD-10-CM | POA: Insufficient documentation

## 2012-09-15 DIAGNOSIS — Z8679 Personal history of other diseases of the circulatory system: Secondary | ICD-10-CM | POA: Insufficient documentation

## 2012-09-15 DIAGNOSIS — K219 Gastro-esophageal reflux disease without esophagitis: Secondary | ICD-10-CM | POA: Insufficient documentation

## 2012-09-15 DIAGNOSIS — Z79899 Other long term (current) drug therapy: Secondary | ICD-10-CM | POA: Insufficient documentation

## 2012-09-15 DIAGNOSIS — Z880 Allergy status to penicillin: Secondary | ICD-10-CM | POA: Insufficient documentation

## 2012-09-15 DIAGNOSIS — R29810 Facial weakness: Secondary | ICD-10-CM | POA: Insufficient documentation

## 2012-09-15 DIAGNOSIS — Z8719 Personal history of other diseases of the digestive system: Secondary | ICD-10-CM | POA: Insufficient documentation

## 2012-09-15 DIAGNOSIS — R300 Dysuria: Secondary | ICD-10-CM | POA: Insufficient documentation

## 2012-09-15 DIAGNOSIS — R002 Palpitations: Secondary | ICD-10-CM | POA: Insufficient documentation

## 2012-09-15 DIAGNOSIS — I4891 Unspecified atrial fibrillation: Secondary | ICD-10-CM | POA: Insufficient documentation

## 2012-09-15 DIAGNOSIS — Z8669 Personal history of other diseases of the nervous system and sense organs: Secondary | ICD-10-CM | POA: Insufficient documentation

## 2012-09-15 DIAGNOSIS — Z8673 Personal history of transient ischemic attack (TIA), and cerebral infarction without residual deficits: Secondary | ICD-10-CM | POA: Insufficient documentation

## 2012-09-15 LAB — BASIC METABOLIC PANEL
BUN: 25 mg/dL — ABNORMAL HIGH (ref 6–23)
CO2: 29 mEq/L (ref 19–32)
Calcium: 9.3 mg/dL (ref 8.4–10.5)
Chloride: 93 mEq/L — ABNORMAL LOW (ref 96–112)
Creatinine, Ser: 1.17 mg/dL — ABNORMAL HIGH (ref 0.50–1.10)
GFR calc Af Amer: 51 mL/min — ABNORMAL LOW (ref >90)
GFR calc non Af Amer: 44 mL/min — ABNORMAL LOW (ref >90)
Glucose, Bld: 146 mg/dL — ABNORMAL HIGH (ref 70–99)
Potassium: 3.5 mEq/L (ref 3.5–5.1)
Sodium: 131 mEq/L — ABNORMAL LOW (ref 135–145)

## 2012-09-15 LAB — URINALYSIS, ROUTINE W REFLEX MICROSCOPIC
Bilirubin Urine: NEGATIVE
Glucose, UA: NEGATIVE mg/dL
Ketones, ur: NEGATIVE mg/dL
Nitrite: NEGATIVE
Protein, ur: 30 mg/dL — AB
Specific Gravity, Urine: 1.015 (ref 1.005–1.030)
Urobilinogen, UA: 0.2 mg/dL (ref 0.0–1.0)
pH: 6.5 (ref 5.0–8.0)

## 2012-09-15 LAB — CBC WITH DIFFERENTIAL/PLATELET
Basophils Absolute: 0 10*3/uL (ref 0.0–0.1)
Basophils Relative: 0 % (ref 0–1)
Eosinophils Absolute: 0 10*3/uL (ref 0.0–0.7)
Eosinophils Relative: 0 % (ref 0–5)
HCT: 30.7 % — ABNORMAL LOW (ref 36.0–46.0)
Hemoglobin: 10 g/dL — ABNORMAL LOW (ref 12.0–15.0)
Lymphocytes Relative: 13 % (ref 12–46)
Lymphs Abs: 1.2 10*3/uL (ref 0.7–4.0)
MCH: 25.9 pg — ABNORMAL LOW (ref 26.0–34.0)
MCHC: 32.6 g/dL (ref 30.0–36.0)
MCV: 79.5 fL (ref 78.0–100.0)
Monocytes Absolute: 0.7 10*3/uL (ref 0.1–1.0)
Monocytes Relative: 8 % (ref 3–12)
Neutro Abs: 7.1 10*3/uL (ref 1.7–7.7)
Neutrophils Relative %: 78 % — ABNORMAL HIGH (ref 43–77)
Platelets: 286 10*3/uL (ref 150–400)
RBC: 3.86 MIL/uL — ABNORMAL LOW (ref 3.87–5.11)
RDW: 13.6 % (ref 11.5–15.5)
WBC: 9.1 10*3/uL (ref 4.0–10.5)

## 2012-09-15 LAB — PROTIME-INR
INR: 1.76 — ABNORMAL HIGH (ref 0.00–1.49)
Prothrombin Time: 19.9 seconds — ABNORMAL HIGH (ref 11.6–15.2)

## 2012-09-15 LAB — URINE MICROSCOPIC-ADD ON

## 2012-09-15 LAB — GLUCOSE, CAPILLARY: Glucose-Capillary: 158 mg/dL — ABNORMAL HIGH (ref 70–99)

## 2012-09-15 MED ORDER — SODIUM CHLORIDE 0.9 % IV BOLUS (SEPSIS)
1000.0000 mL | Freq: Once | INTRAVENOUS | Status: AC
  Administered 2012-09-15: 1000 mL via INTRAVENOUS

## 2012-09-15 MED ORDER — LEVOFLOXACIN 500 MG PO TABS: 500.0000 mg | ORAL_TABLET | Freq: Every day | ORAL | Status: DC

## 2012-09-15 NOTE — ED Provider Notes (Signed)
History     CSN: [id number]  Arrival date & time 09/15/12  1548   First MD Initiated Contact with Patient 09/15/12 1623      Chief Complaint  Patient presents with  . Dizziness  . Emesis  . Facial Droop    (Consider location/radiation/quality/duration/timing/severity/associated sxs/prior treatment) HPI Pt presenting with c/o syncope.  Pt was at urology office today, had just given a urine sample and sat down.  She began to have nausea and felt lightheaded, then briefly syncopized.  She has been having dysuria and has had multiple UTIs in the past which is why she was in the urology office.  No fever/chills, no vomiting or diarrhea.  No chest pain or palpitations.  Pt has hx of afib and is currently wearing a holter monitor due to feeling that her afib was becoming more frequent- she states she can feel palpitations when her afib begins.  She did not have any palpitations today.  No chest pain, no shortness of breath.  No seizure activity. Feels back to her baseline now.  She does have hx of chronic right sided facial droop that she states has been present since age 79 years.  Due to this, urology staff was concerned she was having a stroke.  There are no other associated systemic symptoms, there are no other alleviating or modifying factors. She does take coumadin for afib, did not strike her head.   Past Medical History  Diagnosis Date  . Hypertension   . GERD (gastroesophageal reflux disease)   . Irritable bowel   . Cancer     HX BREAST CANCER  . Stroke     HX OF TIA-NO RESIDUAL PROBLEM--PARALYSIS RT SIDE FACE /PT'S MOUTH DROOPS-AND LOSS OF HEARING RT EAR --SINCE EAR SURGERY AS A CHILD   . Dysrhythmia     ATRIAL FIB - CHRONIC COUMADIN  . Lymphedema     HX OF - IN LEFT ARM--NO NEEDLES OR B/P'S LEFT ARM  . UTI (lower urinary tract infection)     FREQUENT  . Macular degeneration     "BEGINNINGS OF" MACULAR DEGENERATION  . Hearing impaired   . Chronic UTI     Past Surgical  History  Procedure Laterality Date  . Breast surgery  1990's    BREAST CANCER-BILMASTECTOMY--AND RT MASTECTOMY FOR PRE-CANCER  . Joint replacement  2006    RT KNEE REPLACEMENT AT [facility] ABOUT 7 YRS AGO  . Multiple right ear surgeries --as a child - including mastoid surgery    . Total knee arthroplasty  09/20/2011    LEFT TOTAL KNEE ARTHROPLASTY;  Surgeon: Loanne Drilling, MD;  Location: [facility];  Service: Orthopedics;  Laterality: Left;  . Knee arthroscopy  04/07/2012    Procedure: ARTHROSCOPY KNEE;  Surgeon: Loanne Drilling, MD;  Location: [facility];  Service: Orthopedics;  Laterality: Left;  WITH SYNOVECTOMY    No family history on file.  History  Substance Use Topics  . Smoking status: Never Smoker   . Smokeless tobacco: Never Used  . Alcohol Use: Yes     Comment: RARELY    OB History   Grav Para Term Preterm Abortions TAB SAB Ect Mult Living                  Review of Systems ROS reviewed and all otherwise negative except for mentioned in HPI  Allergies  Ampicillin; Ciprofloxacin; Clindamycin/lincomycin; Latex; and Pradaxa  Home Medications   Current Outpatient Rx  Name  Route  Sig  Dispense  Refill  . chlorthalidone (HYGROTON) 50 MG tablet   Oral   Take 25 mg by mouth daily.          Marland Kitchen CRANBERRY PO   Oral   Take 1 capsule by mouth 3 (three) times daily.         . diphenhydramine-acetaminophen (TYLENOL PM EXTRA STRENGTH) 25-500 MG TABS   Oral   Take 2 tablets by mouth at bedtime as needed. Sleep/pain         . loperamide (IMODIUM) 2 MG capsule   Oral   Take 1-4 mg by mouth as needed. diarrhea         . losartan (COZAAR) 100 MG tablet   Oral   Take 100 mg by mouth daily.         . metoprolol succinate (TOPROL-XL) 25 MG 24 hr tablet   Oral   Take 25 mg by mouth 2 (two) times daily.          . Multiple Vitamin (MULTIVITAMIN WITH MINERALS) TABS   Oral   Take 1 tablet by mouth daily.         . Multiple Vitamins-Minerals  (ICAPS PO)   Oral   Take 1 capsule by mouth 2 (two) times daily.         . nitrofurantoin (MACRODANTIN) 100 MG capsule   Oral   Take 100 mg by mouth at bedtime.         . pantoprazole (PROTONIX) 40 MG tablet   Oral   Take 40 mg by mouth 2 (two) times daily.         Marland Kitchen warfarin (COUMADIN) 5 MG tablet   Oral   Take 5-7.5 mg by mouth daily. Tuesday, Thursday, and Saturday patient takes 1.5 tablets and 1 tablet on all other days         . levofloxacin (LEVAQUIN) 500 MG tablet   Oral   Take 1 tablet (500 mg total) by mouth daily.   7 tablet   0     BP 146/58  Pulse 64  Temp(Src) 97.5 F (36.4 C) (Oral)  Resp 19  SpO2 98% Vitals reviewed Physical Exam Physical Examination: General appearance - alert, well appearing, and in no distress Mental status - alert, oriented to person, place, and time Eyes - PERRL, EOMI, no conjunctival injection or scleral icterus Mouth - mucous membranes moist, pharynx normal without lesions Chest - clear to auscultation, no wheezes, rales or rhonchi, symmetric air entry Heart - normal rate, regular rhythm, normal S1, S2, no murmurs, rubs, clicks or gallops Abdomen - soft, nontender, nondistended, no masses or organomegaly Neurological - alert, oriented x 3, cranial nerves 2-12 tested and intact with the exception of chronic right sided facial droop with some dysarthria-which is chronic and at baseline for her.  Strength 5/5 in extremities x 4, sensation intact Extremities - peripheral pulses normal, no pedal edema, no clubbing or cyanosis Skin - normal coloration and turgor, no rashes  ED Course  Procedures (including critical care time)  8:03 PM d/w Dr. Mayford Knife, on call for Dr. Donnie Aho, she states there is no way to find out what holter monitor was showing- she will follow up for this as planned with Dr. Donnie Aho.     Date: 09/15/2012  Rate: 47  Rhythm: sinus bradycardia  QRS Axis: normal  Intervals: PR prolonged  ST/T Wave abnormalities:  normal  Conduction Disutrbances:none  Narrative Interpretation:   Old EKG Reviewed: no significant changes compared to  prior ekg of 09/10/11   Labs Reviewed  GLUCOSE, CAPILLARY - Abnormal; Notable for the following:    Glucose-Capillary 158 (*)    All other components within normal limits  CBC WITH DIFFERENTIAL - Abnormal; Notable for the following:    RBC 3.86 (*)    Hemoglobin 10.0 (*)    HCT 30.7 (*)    MCH 25.9 (*)    Neutrophils Relative % 78 (*)    All other components within normal limits  BASIC METABOLIC PANEL - Abnormal; Notable for the following:    Sodium 131 (*)    Chloride 93 (*)    Glucose, Bld 146 (*)    BUN 25 (*)    Creatinine, Ser 1.17 (*)    GFR calc non Af Amer 44 (*)    GFR calc Af Amer 51 (*)    All other components within normal limits  PROTIME-INR - Abnormal; Notable for the following:    Prothrombin Time 19.9 (*)    INR 1.76 (*)    All other components within normal limits  URINALYSIS, ROUTINE W REFLEX MICROSCOPIC - Abnormal; Notable for the following:    APPearance TURBID (*)    Hgb urine dipstick MODERATE (*)    Protein, ur 30 (*)    Leukocytes, UA LARGE (*)    All other components within normal limits  URINE MICROSCOPIC-ADD ON - Abnormal; Notable for the following:    Bacteria, UA FEW (*)    All other components within normal limits  URINE CULTURE   Ct Head Wo Contrast  09/15/2012   *RADIOLOGY REPORT*  Clinical Data: Dizziness and emesis, facial droop  CT HEAD WITHOUT CONTRAST  Technique:  Contiguous axial images were obtained from the base of the skull through the vertex without contrast.  Comparison: Head CT 08/16/2010  Findings: No acute intracranial hemorrhage.  No focal mass lesion. No CT evidence of acute infarction.   No midline shift or mass effect.  No hydrocephalus.  Basilar cisterns are patent.  There is a large remote infarct within the left cerebellum.  There is extensive periventricular and  white matter hypodensities which extends to   the subcortical white matter.  This is not changed from prior.  There is a lacunar infarction in the deep white matter on the right.  Left mastoid air cells clear.  Prior right mastoid surgery. Paranasal sinuses are clear.  IMPRESSION:  1.  No acute intracranial findings.  2.  Remote infarction of left cerebellum. 3.  Extensive white matter microvascular disease is not changed from prior.   Original Report Authenticated By: Genevive Bi, M.D.     1. Syncope   2. Urinary tract infection       MDM  Pt presenting after syncopal event, she also is found to have UTI- has hx of multiple prior utis and states levaquin is what has been most helpful in clearing them up in the past.  Pt has chronic right sided facial droop and this is at baseline.  No vertigo.  No palpitations or chest pain.  She did have prodromal symptoms of nausea and lightheadedness to suggest more vasovagal nature.  All results d/w patient at bedside and she is agreeable with plan for discharge and is requesting discharge.   She is wearing a holter monitor for palpitations.  She will f/u with Dr. Donnie Aho and with her PMD.  Urine culture pending.  Discharged with strict return precautions.  Pt agreeable with plan.  Ethelda Chick, MD 09/15/12 2137

## 2012-09-15 NOTE — ED Notes (Signed)
Pt ambulating independently w/ steady gait on d/c in no acute distress, A&Ox4. D/c instructions reviewed w/ pt and family - pt and family deny any further questions or concerns at present. Rx given x1  

## 2012-09-15 NOTE — ED Notes (Signed)
Pt given ginger ale, ok per Dr. Karma Ganja

## 2012-09-15 NOTE — ED Notes (Signed)
Attempted othostatics - pt in CT

## 2012-09-15 NOTE — ED Notes (Signed)
Patient has some paralysis on right side of face from a previous operation years ago.

## 2012-09-15 NOTE — ED Notes (Signed)
Patient alert and oriented. No upper extremity drift noted. Able to lift both legs off of bed. Pupils equal and reactive +3.

## 2012-09-15 NOTE — ED Notes (Signed)
Pt notified that urine sample is needed 

## 2012-09-15 NOTE — ED Notes (Addendum)
Pt was at [facility] for a UTI when she started vomiting, was dizzy and reports some vision changes. Pt PCP was concerned that pt was having possible stroke symptoms. These started approx 3:30 pm today. Pt was escorted by friend and two [facility] staff members. Pt is wearing a halter monitor for hx of afib and takes coumadin.Pt reports that she has some facial droop from nerve damage, but vision is different today with dizziness. Pt is A&Ox4 in NAD.

## 2012-09-15 NOTE — ED Notes (Signed)
[phone]  Pt's husband is going home to have dinner.  Please call if he needs to return

## 2012-09-17 LAB — URINE CULTURE
Colony Count: NO GROWTH
Culture: NO GROWTH

## 2012-10-09 ENCOUNTER — Other Ambulatory Visit: Payer: Self-pay | Admitting: Dermatology

## 2012-11-09 ENCOUNTER — Encounter (HOSPITAL_COMMUNITY): Payer: Self-pay

## 2012-11-09 ENCOUNTER — Emergency Department (HOSPITAL_COMMUNITY): Payer: Medicare Other

## 2012-11-09 ENCOUNTER — Inpatient Hospital Stay (HOSPITAL_COMMUNITY)
Admission: EM | Admit: 2012-11-09 | Discharge: 2012-11-10 | DRG: 312 | Disposition: A | Discharge status: Home / Self Care | Payer: Medicare Other | Attending: Internal Medicine | Admitting: Internal Medicine

## 2012-11-09 DIAGNOSIS — M65969 Unspecified synovitis and tenosynovitis, unspecified lower leg: Secondary | ICD-10-CM

## 2012-11-09 DIAGNOSIS — D649 Anemia, unspecified: Secondary | ICD-10-CM | POA: Diagnosis present

## 2012-11-09 DIAGNOSIS — G4733 Obstructive sleep apnea (adult) (pediatric): Secondary | ICD-10-CM | POA: Diagnosis present

## 2012-11-09 DIAGNOSIS — I959 Hypotension, unspecified: Secondary | ICD-10-CM

## 2012-11-09 DIAGNOSIS — Z8249 Family history of ischemic heart disease and other diseases of the circulatory system: Secondary | ICD-10-CM

## 2012-11-09 DIAGNOSIS — I10 Essential (primary) hypertension: Secondary | ICD-10-CM | POA: Diagnosis present

## 2012-11-09 DIAGNOSIS — M1710 Unilateral primary osteoarthritis, unspecified knee: Secondary | ICD-10-CM

## 2012-11-09 DIAGNOSIS — Z87440 Personal history of urinary (tract) infections: Secondary | ICD-10-CM

## 2012-11-09 DIAGNOSIS — R350 Frequency of micturition: Secondary | ICD-10-CM | POA: Diagnosis present

## 2012-11-09 DIAGNOSIS — D62 Acute posthemorrhagic anemia: Secondary | ICD-10-CM

## 2012-11-09 DIAGNOSIS — I480 Paroxysmal atrial fibrillation: Secondary | ICD-10-CM | POA: Diagnosis present

## 2012-11-09 DIAGNOSIS — F3289 Other specified depressive episodes: Secondary | ICD-10-CM | POA: Diagnosis present

## 2012-11-09 DIAGNOSIS — M179 Osteoarthritis of knee, unspecified: Secondary | ICD-10-CM

## 2012-11-09 DIAGNOSIS — M899 Disorder of bone, unspecified: Secondary | ICD-10-CM | POA: Diagnosis present

## 2012-11-09 DIAGNOSIS — Z7901 Long term (current) use of anticoagulants: Secondary | ICD-10-CM

## 2012-11-09 DIAGNOSIS — Z9289 Personal history of other medical treatment: Secondary | ICD-10-CM

## 2012-11-09 DIAGNOSIS — Z803 Family history of malignant neoplasm of breast: Secondary | ICD-10-CM

## 2012-11-09 DIAGNOSIS — K219 Gastro-esophageal reflux disease without esophagitis: Secondary | ICD-10-CM | POA: Diagnosis present

## 2012-11-09 DIAGNOSIS — Z823 Family history of stroke: Secondary | ICD-10-CM

## 2012-11-09 DIAGNOSIS — Z96659 Presence of unspecified artificial knee joint: Secondary | ICD-10-CM

## 2012-11-09 DIAGNOSIS — R197 Diarrhea, unspecified: Secondary | ICD-10-CM | POA: Diagnosis present

## 2012-11-09 DIAGNOSIS — H3530 Unspecified macular degeneration: Secondary | ICD-10-CM | POA: Diagnosis present

## 2012-11-09 DIAGNOSIS — Z8673 Personal history of transient ischemic attack (TIA), and cerebral infarction without residual deficits: Secondary | ICD-10-CM

## 2012-11-09 DIAGNOSIS — K589 Irritable bowel syndrome without diarrhea: Secondary | ICD-10-CM | POA: Diagnosis present

## 2012-11-09 DIAGNOSIS — R55 Syncope and collapse: Principal | ICD-10-CM | POA: Diagnosis present

## 2012-11-09 DIAGNOSIS — I872 Venous insufficiency (chronic) (peripheral): Secondary | ICD-10-CM | POA: Diagnosis present

## 2012-11-09 DIAGNOSIS — E871 Hypo-osmolality and hyponatremia: Secondary | ICD-10-CM | POA: Diagnosis present

## 2012-11-09 DIAGNOSIS — Z79899 Other long term (current) drug therapy: Secondary | ICD-10-CM

## 2012-11-09 DIAGNOSIS — N390 Urinary tract infection, site not specified: Secondary | ICD-10-CM | POA: Diagnosis present

## 2012-11-09 DIAGNOSIS — I4891 Unspecified atrial fibrillation: Secondary | ICD-10-CM | POA: Diagnosis present

## 2012-11-09 DIAGNOSIS — F329 Major depressive disorder, single episode, unspecified: Secondary | ICD-10-CM | POA: Diagnosis present

## 2012-11-09 DIAGNOSIS — E876 Hypokalemia: Secondary | ICD-10-CM

## 2012-11-09 DIAGNOSIS — Z853 Personal history of malignant neoplasm of breast: Secondary | ICD-10-CM

## 2012-11-09 DIAGNOSIS — M659 Synovitis and tenosynovitis, unspecified: Secondary | ICD-10-CM

## 2012-11-09 HISTORY — DX: Malignant neoplasm of unspecified site of unspecified female breast: C50.919

## 2012-11-09 HISTORY — DX: Depression, unspecified: F32.A

## 2012-11-09 HISTORY — DX: Urinary tract infection, site not specified: N39.0

## 2012-11-09 HISTORY — DX: Pneumonia, unspecified organism: J18.9

## 2012-11-09 HISTORY — DX: Unspecified atrial fibrillation: I48.91

## 2012-11-09 HISTORY — DX: Sleep apnea, unspecified: G47.30

## 2012-11-09 HISTORY — DX: Major depressive disorder, single episode, unspecified: F32.9

## 2012-11-09 HISTORY — DX: Unspecified osteoarthritis, unspecified site: M19.90

## 2012-11-09 HISTORY — DX: Unspecified asthma, uncomplicated: J45.909

## 2012-11-09 HISTORY — DX: Unspecified chronic bronchitis: J42

## 2012-11-09 LAB — URINALYSIS, ROUTINE W REFLEX MICROSCOPIC
Bilirubin Urine: NEGATIVE
Glucose, UA: NEGATIVE mg/dL
Ketones, ur: NEGATIVE mg/dL
Nitrite: POSITIVE — AB
Protein, ur: NEGATIVE mg/dL
Specific Gravity, Urine: 1.009 (ref 1.005–1.030)
Urobilinogen, UA: 0.2 mg/dL (ref 0.0–1.0)
pH: 6.5 (ref 5.0–8.0)

## 2012-11-09 LAB — CBC
HCT: 29.8 % — ABNORMAL LOW (ref 36.0–46.0)
Hemoglobin: 10.2 g/dL — ABNORMAL LOW (ref 12.0–15.0)
MCH: 27 pg (ref 26.0–34.0)
MCHC: 34.2 g/dL (ref 30.0–36.0)
MCV: 78.8 fL (ref 78.0–100.0)
Platelets: 297 10*3/uL (ref 150–400)
RBC: 3.78 MIL/uL — ABNORMAL LOW (ref 3.87–5.11)
RDW: 14 % (ref 11.5–15.5)
WBC: 8.2 10*3/uL (ref 4.0–10.5)

## 2012-11-09 LAB — COMPREHENSIVE METABOLIC PANEL
ALT: 11 U/L (ref 0–35)
AST: 15 U/L (ref 0–37)
Albumin: 3.7 g/dL (ref 3.5–5.2)
Alkaline Phosphatase: 74 U/L (ref 39–117)
BUN: 20 mg/dL (ref 6–23)
CO2: 24 mEq/L (ref 19–32)
Calcium: 8.6 mg/dL (ref 8.4–10.5)
Chloride: 90 mEq/L — ABNORMAL LOW (ref 96–112)
Creatinine, Ser: 0.93 mg/dL (ref 0.50–1.10)
GFR calc Af Amer: 67 mL/min — ABNORMAL LOW (ref >90)
GFR calc non Af Amer: 58 mL/min — ABNORMAL LOW (ref >90)
Glucose, Bld: 114 mg/dL — ABNORMAL HIGH (ref 70–99)
Potassium: 3.7 mEq/L (ref 3.5–5.1)
Sodium: 124 mEq/L — ABNORMAL LOW (ref 135–145)
Total Bilirubin: 0.2 mg/dL — ABNORMAL LOW (ref 0.3–1.2)
Total Protein: 7 g/dL (ref 6.0–8.3)

## 2012-11-09 LAB — URINE MICROSCOPIC-ADD ON

## 2012-11-09 LAB — MAGNESIUM: Magnesium: 2.2 mg/dL (ref 1.5–2.5)

## 2012-11-09 LAB — LACTIC ACID, PLASMA: Lactic Acid, Venous: 0.9 mmol/L (ref 0.5–2.2)

## 2012-11-09 MED ORDER — LEVOFLOXACIN IN D5W 750 MG/150ML IV SOLN
750.0000 mg | Freq: Once | INTRAVENOUS | Status: AC
  Administered 2012-11-09: 750 mg via INTRAVENOUS
  Filled 2012-11-09: qty 150

## 2012-11-09 MED ORDER — SODIUM CHLORIDE 0.9 % IV BOLUS (SEPSIS)
1000.0000 mL | Freq: Once | INTRAVENOUS | Status: AC
  Administered 2012-11-09: 1000 mL via INTRAVENOUS
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
## 2012-11-09 NOTE — ED Notes (Signed)
Ambulated pt to the restroom. Pt was able to ambulate alone and did well.

## 2012-11-09 NOTE — ED Notes (Signed)
Phlebotomy at bedside.

## 2012-11-09 NOTE — ED Notes (Signed)
Patient provided with [place] sandwich meal bag and ginger-ale. Tolerating well.

## 2012-11-09 NOTE — ED Notes (Signed)
Patient from home via [facility].  At 6 pm this evening, husband reports that patient was sitting in kitchen chair, diaphoretic and unresponsive. Patient was aroused by husband by painful stimuli.  At EMS arrival, left facial droop noted however, this is chronic since age 79 d/t nerve damage. Passed EMS stroke test. Neuro intact. Slight weakness to left side which is also chronic since age 9. Hx TIA 8-10 years; no episode since.  Also Hx HTN & A-fib  Radial palpated BP 90.  12 lead Sinus Bradycardia at 48 bpm. PIV 20g LFA. Patient received NS 700 mL. HR 58.  Reports episodes of diarrhea over the last week. Currently being treated for a UTI.  CBG 165. BP 134/80 HR 58. Patient AAOx4, resp e/u, skin warm and dry.

## 2012-11-09 NOTE — ED Notes (Signed)
Portable CXR at bedside.

## 2012-11-10 ENCOUNTER — Encounter (HOSPITAL_COMMUNITY): Payer: Self-pay | Admitting: Emergency Medicine

## 2012-11-10 DIAGNOSIS — D649 Anemia, unspecified: Secondary | ICD-10-CM | POA: Diagnosis present

## 2012-11-10 LAB — PROTIME-INR
INR: 2.68 — ABNORMAL HIGH (ref 0.00–1.49)
Prothrombin Time: 27.6 seconds — ABNORMAL HIGH (ref 11.6–15.2)

## 2012-11-10 LAB — BASIC METABOLIC PANEL
BUN: 13 mg/dL (ref 6–23)
CO2: 23 mEq/L (ref 19–32)
Calcium: 8.4 mg/dL (ref 8.4–10.5)
Chloride: 98 mEq/L (ref 96–112)
Creatinine, Ser: 0.87 mg/dL (ref 0.50–1.10)
GFR calc Af Amer: 73 mL/min — ABNORMAL LOW (ref >90)
GFR calc non Af Amer: 63 mL/min — ABNORMAL LOW (ref >90)
Glucose, Bld: 135 mg/dL — ABNORMAL HIGH (ref 70–99)
Potassium: 3.1 mEq/L — ABNORMAL LOW (ref 3.5–5.1)
Sodium: 132 mEq/L — ABNORMAL LOW (ref 135–145)

## 2012-11-10 LAB — CBC
HCT: 27.7 % — ABNORMAL LOW (ref 36.0–46.0)
Hemoglobin: 9.7 g/dL — ABNORMAL LOW (ref 12.0–15.0)
MCH: 27.3 pg (ref 26.0–34.0)
MCHC: 35 g/dL (ref 30.0–36.0)
MCV: 78 fL (ref 78.0–100.0)
Platelets: 294 10*3/uL (ref 150–400)
RBC: 3.55 MIL/uL — ABNORMAL LOW (ref 3.87–5.11)
RDW: 13.8 % (ref 11.5–15.5)
WBC: 4.8 10*3/uL (ref 4.0–10.5)

## 2012-11-10 LAB — TROPONIN I
Troponin I: <0.3 ng/mL (ref <0.30)
Troponin I: <0.3 ng/mL (ref <0.30)
Troponin I: <0.3 ng/mL (ref <0.30)

## 2012-11-10 LAB — RETICULOCYTES
RBC.: 3.54 MIL/uL — ABNORMAL LOW (ref 3.87–5.11)
Retic Count, Absolute: 46 10*3/uL (ref 19.0–186.0)
Retic Ct Pct: 1.3 % (ref 0.4–3.1)

## 2012-11-10 LAB — IRON AND TIBC
Iron: 62 ug/dL (ref 42–135)
Saturation Ratios: 24 % (ref 20–55)
TIBC: 254 ug/dL (ref 250–470)
UIBC: 192 ug/dL (ref 125–400)

## 2012-11-10 LAB — FERRITIN: Ferritin: 162 ng/mL (ref 10–291)

## 2012-11-10 LAB — FOLATE: Folate: >20 ng/mL

## 2012-11-10 LAB — VITAMIN B12: Vitamin B-12: 815 pg/mL (ref 211–911)

## 2012-11-10 MED ORDER — URELLE 81 MG PO TABS
1.0000 | ORAL_TABLET | Freq: Three times a day (TID) | ORAL | Status: DC
  Filled 2012-11-10 (×3): qty 1

## 2012-11-10 MED ORDER — LEVOFLOXACIN IN D5W 500 MG/100ML IV SOLN
500.0000 mg | INTRAVENOUS | Status: DC
  Filled 2012-11-10: qty 100

## 2012-11-10 MED ORDER — HYDROCODONE-ACETAMINOPHEN 5-325 MG PO TABS: 1.0000 | ORAL_TABLET | ORAL | Status: DC | PRN

## 2012-11-10 MED ORDER — POLYETHYLENE GLYCOL 3350 17 G PO PACK
17.0000 g | PACK | Freq: Every day | ORAL | Status: DC | PRN
  Filled 2012-11-10: qty 1

## 2012-11-10 MED ORDER — METHENAMINE HIPPURATE 1 G PO TABS: 1.0000 g | ORAL_TABLET | Freq: Two times a day (BID) | ORAL | Status: DC

## 2012-11-10 MED ORDER — WARFARIN SODIUM 5 MG PO TABS
5.0000 mg | ORAL_TABLET | ORAL | Status: DC
  Filled 2012-11-10: qty 1

## 2012-11-10 MED ORDER — ONDANSETRON HCL 4 MG PO TABS: 4.0000 mg | ORAL_TABLET | Freq: Four times a day (QID) | ORAL | Status: DC | PRN

## 2012-11-10 MED ORDER — ACETAMINOPHEN 325 MG PO TABS: 650.0000 mg | ORAL_TABLET | Freq: Four times a day (QID) | ORAL | Status: DC | PRN

## 2012-11-10 MED ORDER — PANTOPRAZOLE SODIUM 40 MG PO TBEC
40.0000 mg | DELAYED_RELEASE_TABLET | Freq: Two times a day (BID) | ORAL | Status: DC
  Administered 2012-11-10: 40 mg via ORAL
  Filled 2012-11-10: qty 1

## 2012-11-10 MED ORDER — BISMUTH SUBSALICYLATE 262 MG/15ML PO SUSP
30.0000 mL | ORAL | Status: DC | PRN
  Filled 2012-11-10: qty 236

## 2012-11-10 MED ORDER — WARFARIN SODIUM 7.5 MG PO TABS: 7.5000 mg | ORAL_TABLET | ORAL | Status: DC

## 2012-11-10 MED ORDER — ONDANSETRON HCL 4 MG/2ML IJ SOLN: 4.0000 mg | Freq: Four times a day (QID) | INTRAMUSCULAR | Status: DC | PRN

## 2012-11-10 MED ORDER — METOPROLOL SUCCINATE ER 25 MG PO TB24: 25.0000 mg | ORAL_TABLET | Freq: Every day | ORAL | Status: DC

## 2012-11-10 MED ORDER — URELLE 81 MG PO TABS
1.0000 | ORAL_TABLET | Freq: Three times a day (TID) | ORAL | Status: DC
  Administered 2012-11-10: 81 mg via ORAL
  Filled 2012-11-10 (×3): qty 1

## 2012-11-10 MED ORDER — ACETAMINOPHEN 650 MG RE SUPP: 650.0000 mg | Freq: Four times a day (QID) | RECTAL | Status: DC | PRN

## 2012-11-10 MED ORDER — SODIUM CHLORIDE 0.9 % IJ SOLN
3.0000 mL | Freq: Two times a day (BID) | INTRAMUSCULAR | Status: DC
  Administered 2012-11-10: 3 mL via INTRAVENOUS

## 2012-11-10 MED ORDER — PROSIGHT PO TABS
1.0000 | ORAL_TABLET | Freq: Two times a day (BID) | ORAL | Status: DC
  Administered 2012-11-10: 1 via ORAL
  Filled 2012-11-10 (×3): qty 1

## 2012-11-10 MED ORDER — ICAPS PO CAPS: 1.0000 | ORAL_CAPSULE | Freq: Two times a day (BID) | ORAL | Status: DC

## 2012-11-10 MED ORDER — WARFARIN - PHARMACIST DOSING INPATIENT: Freq: Every day | Status: DC

## 2012-11-10 MED ORDER — ADULT MULTIVITAMIN W/MINERALS CH
1.0000 | ORAL_TABLET | Freq: Every day | ORAL | Status: DC
  Administered 2012-11-10: 1 via ORAL
  Filled 2012-11-10: qty 1

## 2012-11-10 MED ORDER — SODIUM CHLORIDE 0.9 % IV SOLN
INTRAVENOUS | Status: DC
  Administered 2012-11-10: 01:00:00 via INTRAVENOUS

## 2012-11-10 MED ORDER — LOSARTAN POTASSIUM 50 MG PO TABS
100.0000 mg | ORAL_TABLET | Freq: Every day | ORAL | Status: DC
  Administered 2012-11-10: 100 mg via ORAL
  Filled 2012-11-10: qty 2

## 2012-11-10 MED ORDER — POTASSIUM CHLORIDE IN NACL 20-0.9 MEQ/L-% IV SOLN
INTRAVENOUS | Status: DC
  Administered 2012-11-10: 1000 mL via INTRAVENOUS
  Filled 2012-11-10 (×4): qty 1000

## 2012-11-10 MED ORDER — LOSARTAN POTASSIUM 50 MG PO TABS: 100.0000 mg | ORAL_TABLET | Freq: Every day | ORAL | Status: DC

## 2012-11-10 MED ORDER — METOPROLOL SUCCINATE ER 25 MG PO TB24
25.0000 mg | ORAL_TABLET | Freq: Two times a day (BID) | ORAL | Status: DC
  Administered 2012-11-10: 25 mg via ORAL
  Filled 2012-11-10 (×2): qty 1

## 2012-11-10 NOTE — Progress Notes (Signed)
Call received from Lupita Leash, [place] Dr. Silvano Rusk nurse who informed nurse that MD stated that pt will be d/c'd today and ABX if needed will be po.  Pt and husband notified.  Express appreciation.  Amanda Pea, Charity fundraiser.

## 2012-11-10 NOTE — Progress Notes (Signed)
The patient had a bilateral mastectomy 20+ years ago.  EMS put an IV in the left forearm prior to her arrival to the ED.

## 2012-11-10 NOTE — Progress Notes (Signed)
Faxed EKG to Dr. Eloise Harman as he  Instructed and verified if he had received it.  Stated he checked and instructed him to call back if not received.  Amanda Pea, Charity fundraiser.

## 2012-11-10 NOTE — Progress Notes (Signed)
The patient arrived to [id number].  She was oriented to the unit and placed on telemetry.  VS were taken and the patient was assessed.  The call bell was placed within reach and the bed alarm was turned on.  She is A&Ox4 and does not have any complaints of pain at this time.

## 2012-11-10 NOTE — ED Provider Notes (Signed)
Medical screening examination/treatment/procedure(s) were conducted as a shared visit with resident-physician practitioner(s) and myself.  I personally evaluated the patient during the encounter.  Pt is a 79 y.o. female with pmhx as above presenting with brief episode of LOC, hypotension that has since resolved.  Pt recently tx'd for UTI,but failed tx w/ keflex and started on macrobid recently.  On my exam after IVF resuscitation, py has nml neuro exam, benign abdomen.  Given failed outpt UTI treatment w/ hypotension at home, will admit medical service.   Shanna Cisco, MD 11/10/12 1049

## 2012-11-10 NOTE — Discharge Summary (Signed)
Physician Discharge Summary  Patient ID: [name] MRN: [id number] DOB/AGE: [birth year] 79 y.o.  Admit date: 11/09/2012 Discharge date: 11/10/2012   Discharge Diagnoses:  Principal Problem:   Syncope Active Problems:   Anemia   Urinary tract infection, recurrent   Hyponatremia   Diarrhea   Paroxysmal atrial fibrillation   Hypertension   Discharged Condition: good  Hospital Course:   The patient is a 79 year old Caucasian woman with a history of paroxysmal atrial fibrillation on chronic Coumadin anticoagulation, and with a history of hypertension and recurrent urinary tract infections who presented to the emergency department with a complaint of passing out.  The patient states that she was in her usual state of health until about 4 days ago when she developed dysuria and frequency.  She was prescribed Keflex antibiotic for this as she has frequent documented urinary tract infections.  She took this for 3 days with initial improvement in her urinary symptoms.  She developed diarrhea with 3-4 loose bowel movements per day for the past 4 days.  On the evening prior to admission she developed dysuria and frequency, so she started nitrofurantoin that she had at home.  On the evening of admission while cooking dinner she began to feel lightheaded and sat down at the kitchen table then had a brief episode of syncope that was witnessed by her husband.  She was unresponsive for about 1-2 minutes and did not have seizure-like activity.  911 was called and EMS personnel noted in initial blood pressure of 70/50 with a heart rate of 40.  She was brought to the emergency department for further evaluation. Her initial EKG did not show an acute coronary syndrome and she is treated with IV fluids.  Labs were significant for hyponatremia with a serum sodium of 124 (she takes chlorthalidone).  She had a similar episode of syncope in June 2014 when she was waiting to see her urologist and became unresponsive.  A  workup at that time included telemetry and an event monitor study as well as an echocardiogram that were unremarkable.  She was admitted to a telemetry bed and showed no significant arrhythmia.  She had serial troponin I levels that were normal 3.  She was treated with moderate dose IV fluids with subsequent normalization of her blood pressure.  On the day of discharge she felt fine and had a good appetite with no nausea or diarrhea or dyspnea, chest discomfort, or palpitations.  She continued to have mild dysuria.  Of note, additional labs obtained during her hospitalization included a urine culture and anemia workup labs, which were pending at the time of dictation.  Consults: None  Significant Diagnostic Studies:  Dg Chest Port 1 View  11/09/2012   *RADIOLOGY REPORT*  Clinical Data: Hypotension  PORTABLE CHEST - 1 VIEW  Comparison: 09/10/2011  Findings: The heart and pulmonary within normal limits.  The lungs are clear bilaterally.  No focal confluent infiltrate is seen.  No bony abnormality is noted.  IMPRESSION: No acute abnormalities seen.   Original Report Authenticated By: Alcide Clever, M.D.    Labs: Lab Results  Component Value Date   WBC 4.8 11/10/2012   HGB 9.7* 11/10/2012   HCT 27.7* 11/10/2012   MCV 78.0 11/10/2012   PLT 294 11/10/2012     Recent Labs Lab 11/09/12 2054 11/10/12 0942  NA 124* 132*  K 3.7 3.1*  CL 90* 98  CO2 24 23  BUN 20 13  CREATININE 0.93 0.87  CALCIUM 8.6 8.4  PROT 7.0  --   BILITOT 0.2*  --   ALKPHOS 74  --   ALT 11  --   AST 15  --   GLUCOSE 114* 135*    12-lead EKG on the day of discharge showed the following: Sinus bradycardia with a heart rate of 58, otherwise normal EKG.   Lab Results  Component Value Date   INR 2.68* 11/10/2012   INR 1.76* 09/15/2012   INR 0.98 04/07/2012     No results found for this or any previous visit (from the past 240 hour(s)).    Discharge Exam: Blood pressure 109/43, pulse 58, temperature 97.5 F (36.4 C),  temperature source Oral, resp. rate 18, height 5\' 4"  (1.626 m), weight 73.211 kg (161 lb 6.4 oz), SpO2 100.00%.  Physical Exam: In general, she is a mildly overweight white woman who was in no apparent distress while lying partially upright in bed.  HEENT exam was significant for her usual right-sided facial droop, neck was supple without jugular venous distention or carotid bruit, chest was clear to auscultation, heart had a regular rate and rhythm without significant murmur or gallop, abdomen had normal bowel sounds and no tenderness, extremities were without cyanosis, clubbing, or edema.  Disposition:  she will be discharged from hospital to home in the company of her husband. Her primary care physician is on vacation next week, as well as his nurse.  However, she should still come in to have her INR checked early next week (Monday or Tuesday), since her antibiotic could interact with Coumadin. Note that her dose of Toprol XL has been decreased somewhat because of hypotension associated with bradycardia.  Discharge Orders   Future Orders Complete By Expires   Call MD for:  As directed    Comments:     Dizziness, chest pain, irregular heartbeat, difficulty breathing, unusual bleeding, or any other concerning symptoms.   Diet - low sodium heart healthy  As directed    Discharge instructions  As directed    Comments:     She should take over the counter pedialyte at 3 bottles over the next 24 hours and as needed for diarrhea. She should make sure that she has her INR level rechecked either Monday or Tuesday of next week as her antibiotic could interact with coumadin.   Increase activity slowly  As directed        Medication List    STOP taking these medications       chlorthalidone 50 MG tablet  Commonly known as:  HYGROTON      TAKE these medications       CRANBERRY PO  Take 1 capsule by mouth 3 (three) times daily.     ICAPS PO  Take 1 capsule by mouth 2 (two) times daily.      levofloxacin 500 MG tablet  Commonly known as:  LEVAQUIN  Take 1 tablet (500 mg total) by mouth daily.     loperamide 2 MG capsule  Commonly known as:  IMODIUM  Take 1-4 mg by mouth as needed. diarrhea     losartan 100 MG tablet  Commonly known as:  COZAAR  Take 100 mg by mouth daily.     methenamine 1 G tablet  Commonly known as:  HIPREX  Take 1 g by mouth 2 (two) times daily with a meal.     metoprolol succinate 25 MG 24 hr tablet  Commonly known as:  TOPROL-XL  Take 1 tablet (25 mg total) by mouth  daily.     multivitamin with minerals Tabs tablet  Take 1 tablet by mouth daily.     nitrofurantoin 100 MG capsule  Commonly known as:  MACRODANTIN  Take 100 mg by mouth at bedtime.     pantoprazole 40 MG tablet  Commonly known as:  PROTONIX  Take 40 mg by mouth 2 (two) times daily.     TYLENOL PM EXTRA STRENGTH 25-500 MG Tabs  Generic drug:  diphenhydramine-acetaminophen  Take 2 tablets by mouth at bedtime as needed. Sleep/pain     URELLE PO  Take 1 tablet by mouth 3 (three) times daily as needed (urinary tract).     warfarin 5 MG tablet  Commonly known as:  COUMADIN  Take 5-7.5 mg by mouth daily. Tuesday, Thursday, and Saturday patient takes 1.5 tablets and 1 tablet on all other days         Signed: Garlan Fillers 11/10/2012, 4:00 PM

## 2012-11-10 NOTE — Progress Notes (Signed)
ANTICOAGULATION CONSULT NOTE - Initial Consult  Pharmacy Consult for coumadin  Indication: atrial fibrillation  Allergies  Allergen Reactions  . Ampicillin     C-DIF AFTER TAKING AMPICILLIN     . Ciprofloxacin     RASH PT STATES ANY OTHER DRUGS IN CIPRO FAMILY SHE IS ALLERGIC TO  . Clindamycin/Lincomycin     PT STATES HER DOCTOR TOLD HER NOT TO TAKE CLINDAMYCIN BECAUSE SHE GOT C-DIFF AFTER TAKING AMPICILLIN  . Latex Other (See Comments)    Blisters   . Pradaxa [Dabigatran Etexilate Mesylate]     INTERNAL BLEEDING    Patient Measurements: Height: 5\' 4"  (162.6 cm) Weight: 163 lb 1.6 oz (73.982 kg) (Scale C) IBW/kg (Calculated) : 54.7 Heparin Dosing Weight:   Vital Signs: Temp: 97.1 F (36.2 C) (08/15 0023) Temp src: Oral (08/15 0023) BP: 155/63 mmHg (08/15 0023) Pulse Rate: 65 (08/15 0023)  Labs:  Recent Labs  11/09/12 2054  HGB 10.2*  HCT 29.8*  PLT 297  CREATININE 0.93    Estimated Creatinine Clearance: 49.9 ml/min (by C-G formula based on Cr of 0.93).   Medical History: Past Medical History  Diagnosis Date  . Hypertension   . GERD (gastroesophageal reflux disease)   . Irritable bowel   . Cancer     HX BREAST CANCER  . Stroke     HX OF TIA-NO RESIDUAL PROBLEM--PARALYSIS RT SIDE FACE /PT'S MOUTH DROOPS-AND LOSS OF HEARING RT EAR --SINCE EAR SURGERY AS A CHILD   . Dysrhythmia     ATRIAL FIB - CHRONIC COUMADIN  . Lymphedema     HX OF - IN LEFT ARM--NO NEEDLES OR B/P'S LEFT ARM  . UTI (lower urinary tract infection)     FREQUENT  . Macular degeneration     "BEGINNINGS OF" MACULAR DEGENERATION  . Hearing impaired   . Chronic UTI     Medications:  Prescriptions prior to admission  Medication Sig Dispense Refill  . chlorthalidone (HYGROTON) 50 MG tablet Take 50 mg by mouth daily.       Marland Kitchen CRANBERRY PO Take 1 capsule by mouth 3 (three) times daily.      . diphenhydramine-acetaminophen (TYLENOL PM EXTRA STRENGTH) 25-500 MG TABS Take 2 tablets by mouth  at bedtime as needed. Sleep/pain      . loperamide (IMODIUM) 2 MG capsule Take 1-4 mg by mouth as needed. diarrhea      . losartan (COZAAR) 100 MG tablet Take 100 mg by mouth daily.      . Meth-Hyo-M Bl-Na Phos-Ph Sal (URELLE PO) Take 1 tablet by mouth 3 (three) times daily as needed (urinary tract).      . methenamine (HIPREX) 1 G tablet Take 1 g by mouth 2 (two) times daily with a meal.      . metoprolol succinate (TOPROL-XL) 25 MG 24 hr tablet Take 25 mg by mouth 2 (two) times daily.       . Multiple Vitamin (MULTIVITAMIN WITH MINERALS) TABS Take 1 tablet by mouth daily.      . Multiple Vitamins-Minerals (ICAPS PO) Take 1 capsule by mouth 2 (two) times daily.      . nitrofurantoin (MACRODANTIN) 100 MG capsule Take 100 mg by mouth at bedtime.      . pantoprazole (PROTONIX) 40 MG tablet Take 40 mg by mouth 2 (two) times daily.      Marland Kitchen warfarin (COUMADIN) 5 MG tablet Take 5-7.5 mg by mouth daily. Tuesday, Thursday, and Saturday patient takes 1.5 tablets and 1 tablet  on all other days      . levofloxacin (LEVAQUIN) 500 MG tablet Take 1 tablet (500 mg total) by mouth daily.  7 tablet  0    Assessment: 79 yo with syncopal episode. Hx of PAF HTN and chronic UTI's. Has had Diarrhea x4 days which may affect her INR requiring adjustment. Unfortunately a baseline INR was not drawn with initial extensive labwork. Will order with am labs and adjust coumadin dose appropriately.  Goal of Therapy:  INR 2-3 Monitor platelets by anticoagulation protocol: Yes   Plan:  Protime INR with am labs.  Dose coumadin based on results.   Janice Coffin 11/10/2012,1:46 AM

## 2012-11-10 NOTE — Progress Notes (Signed)
Pt's husband requesting to know if she is going home today.  Called Dr Silvano Rusk office and informed by his nurse Pura Spice, RN that he is see a pt at this time and left message that pt and husband wants to know if she is going home today and if she can come off contact isolation.  Awaiting for return call.  Notify pt and husband.  Amanda Pea, Charity fundraiser.

## 2012-11-10 NOTE — Progress Notes (Signed)
UR COMPLETED  

## 2012-11-10 NOTE — Progress Notes (Signed)
Subjective: Did not sleep last night and is tired, denies palpitations, chest discomfort, dyspnea, or lightheadedness, and has not had a bowel movement since yesterday. She does have mild dysuria.  Objective: Vital signs in last 24 hours: Temp:  [97.1 F (36.2 C)-98 F (36.7 C)] 97.9 F (36.6 C) (08/15 0406) Pulse Rate:  [52-68] 67 (08/15 0406) Resp:  [12-19] 16 (08/15 0406) BP: (98-156)/(49-69) 135/55 mmHg (08/15 0406) SpO2:  [95 %-100 %] 98 % (08/15 0406) Weight:  [73.211 kg (161 lb 6.4 oz)-73.982 kg (163 lb 1.6 oz)] 73.211 kg (161 lb 6.4 oz) (08/15 0406) Weight change:    Intake/Output from previous day: 08/14 0701 - 08/15 0700 In: 240 [P.O.:240] Out: 1175 [Urine:1175]   General appearance: alert, cooperative and no distress Chest was clear to auscultation, heart had a regular rate and rhythm without significant murmur, abdomen was benign, and extremities were without edema, neurological exam was without focal deficits Lab Results:  Recent Labs  11/09/12 2054  WBC 8.2  HGB 10.2*  HCT 29.8*  PLT 297   BMET  Recent Labs  11/09/12 2054  NA 124*  K 3.7  CL 90*  CO2 24  GLUCOSE 114*  BUN 20  CREATININE 0.93  CALCIUM 8.6   CMET CMP     Component Value Date/Time   NA 124* 11/09/2012 2054   K 3.7 11/09/2012 2054   CL 90* 11/09/2012 2054   CO2 24 11/09/2012 2054   GLUCOSE 114* 11/09/2012 2054   BUN 20 11/09/2012 2054   CREATININE 0.93 11/09/2012 2054   CALCIUM 8.6 11/09/2012 2054   PROT 7.0 11/09/2012 2054   ALBUMIN 3.7 11/09/2012 2054   AST 15 11/09/2012 2054   ALT 11 11/09/2012 2054   ALKPHOS 74 11/09/2012 2054   BILITOT 0.2* 11/09/2012 2054   GFRNONAA 58* 11/09/2012 2054   GFRAA 67* 11/09/2012 2054    CBG (last 3)  No results found for this basename: GLUCAP,  in the last 72 hours  INR RESULTS:   Lab Results  Component Value Date   INR 1.76* 09/15/2012   INR 0.98 04/07/2012   INR 2.12* 09/23/2011     Studies/Results: Dg Chest Port 1 View  11/09/2012    *RADIOLOGY REPORT*  Clinical Data: Hypotension  PORTABLE CHEST - 1 VIEW  Comparison: 09/10/2011  Findings: The heart and pulmonary within normal limits.  The lungs are clear bilaterally.  No focal confluent infiltrate is seen.  No bony abnormality is noted.  IMPRESSION: No acute abnormalities seen.   Original Report Authenticated By: Alcide Clever, M.D.    Medications: I have reviewed the patient's current medications.  Assessment/Plan: #1 Syncope: appears to be vasovagal and she just completed cardiologist workup that included an echo and an event monitor that was unremarkable. Syncope appears to be brought on by dehydration from recent nausea, vomiting, and diarrhea. Will continue telemetry, check 12 lead EKG and serial cardiac enzymes, increase IVF and consider discharge later today. #2 Hyponatremia: moderate and likely from chlorthalidone. Will continue IVF and hold chlortthalidone for now.   LOS: 1 day   Ettel Albergo G 11/10/2012, 7:44 AM

## 2012-11-10 NOTE — Progress Notes (Signed)
ANTICOAGULATION CONSULT NOTE - Initial Consult  Pharmacy Consult for coumadin  Indication: atrial fibrillation  Allergies  Allergen Reactions  . Ampicillin     C-DIF AFTER TAKING AMPICILLIN     . Ciprofloxacin     RASH PT STATES ANY OTHER DRUGS IN CIPRO FAMILY SHE IS ALLERGIC TO  . Clindamycin/Lincomycin     PT STATES HER DOCTOR TOLD HER NOT TO TAKE CLINDAMYCIN BECAUSE SHE GOT C-DIFF AFTER TAKING AMPICILLIN  . Latex Other (See Comments)    Blisters   . Pradaxa [Dabigatran Etexilate Mesylate]     INTERNAL BLEEDING   Labs:  Recent Labs  11/09/12 2054 11/10/12 0155 11/10/12 0942 11/10/12 0947  HGB 10.2*  --  9.7*  --   HCT 29.8*  --  27.7*  --   PLT 297  --  294  --   LABPROT  --   --  27.6*  --   INR  --   --  2.68*  --   CREATININE 0.93  --   --   --   TROPONINI  --  <0.30  --  <0.30    Estimated Creatinine Clearance: 49.7 ml/min (by C-G formula based on Cr of 0.93).  Assessment: 79 yo with syncopal episode. Hx of PAF HTN and chronic UTI's.  Admitted with syncope and hyponatremia  INR is therapeutic  -- continue home dose  Goal of Therapy:  INR 2-3 Monitor platelets by anticoagulation protocol: Yes   Plan:  1) Coumadin 7.5 mg on Thursday and Saturday 2) Coumadin 5 mg Friday, Sunday, Monday through Wednesday 3) Daily INR  Thank you. Okey Regal, PharmD [phone] 11/10/2012,11:04 AM

## 2012-11-10 NOTE — ED Provider Notes (Signed)
CSN: [id number]     Arrival date & time 11/09/12  1941 History     First MD Initiated Contact with Patient 11/09/12 1945     Chief Complaint  Patient presents with  . Hypotension   (Consider location/radiation/quality/duration/timing/severity/associated sxs/prior Treatment) Patient is a 79 y.o. female presenting with syncope. The history is provided by the patient.  Loss of Consciousness Episode history:  Single Most recent episode:  Today Duration:  30 seconds Timing:  Rare Progression:  Resolved Chronicity:  New Witnessed: yes   Relieved by: lying down and IV fluids. Worsened by:  Nothing tried Ineffective treatments:  None tried Associated symptoms: dizziness   Associated symptoms: no chest pain, no diaphoresis, no fever, no headaches, no nausea, no palpitations, no seizures, no shortness of breath and no vomiting     Past Medical History  Diagnosis Date  . Hypertension   . GERD (gastroesophageal reflux disease)   . Irritable bowel   . Cancer     HX BREAST CANCER  . Stroke     HX OF TIA-NO RESIDUAL PROBLEM--PARALYSIS RT SIDE FACE /PT'S MOUTH DROOPS-AND LOSS OF HEARING RT EAR --SINCE EAR SURGERY AS A CHILD   . Dysrhythmia     ATRIAL FIB - CHRONIC COUMADIN  . Lymphedema     HX OF - IN LEFT ARM--NO NEEDLES OR B/P'S LEFT ARM  . UTI (lower urinary tract infection)     FREQUENT  . Macular degeneration     "BEGINNINGS OF" MACULAR DEGENERATION  . Hearing impaired   . Chronic UTI    Past Surgical History  Procedure Laterality Date  . Breast surgery  1990's    BREAST CANCER-BILMASTECTOMY--AND RT MASTECTOMY FOR PRE-CANCER  . Joint replacement  2006    RT KNEE REPLACEMENT AT [facility] ABOUT 7 YRS AGO  . Multiple right ear surgeries --as a child - including mastoid surgery    . Total knee arthroplasty  09/20/2011    LEFT TOTAL KNEE ARTHROPLASTY;  Surgeon: Loanne Drilling, MD;  Location: [facility];  Service: Orthopedics;  Laterality: Left;  . Knee arthroscopy  04/07/2012   Procedure: ARTHROSCOPY KNEE;  Surgeon: Loanne Drilling, MD;  Location: [facility];  Service: Orthopedics;  Laterality: Left;  WITH SYNOVECTOMY   History reviewed. No pertinent family history. History  Substance Use Topics  . Smoking status: Never Smoker   . Smokeless tobacco: Never Used  . Alcohol Use: Yes     Comment: RARELY   OB History   Grav Para Term Preterm Abortions TAB SAB Ect Mult Living                 Review of Systems  Constitutional: Negative for fever, chills, diaphoresis and fatigue.  HENT: Negative for ear pain, congestion, sore throat, facial swelling, mouth sores, trouble swallowing, neck pain and neck stiffness.   Eyes: Negative.   Respiratory: Negative for apnea, cough, chest tightness, shortness of breath and wheezing.   Cardiovascular: Positive for syncope. Negative for chest pain, palpitations and leg swelling.  Gastrointestinal: Negative for nausea, vomiting, abdominal pain, diarrhea and abdominal distention.  Genitourinary: Positive for dysuria, urgency and frequency. Negative for hematuria, flank pain, vaginal discharge, difficulty urinating and menstrual problem.  Musculoskeletal: Negative for back pain and gait problem.  Skin: Negative for rash and wound.  Neurological: Positive for dizziness, syncope and light-headedness. Negative for tremors, seizures, facial asymmetry, numbness and headaches.  Psychiatric/Behavioral: Negative.   All other systems reviewed and are negative.    Allergies  Ampicillin; Ciprofloxacin; Clindamycin/lincomycin; Latex; and Pradaxa  Home Medications   Current Outpatient Rx  Name  Route  Sig  Dispense  Refill  . chlorthalidone (HYGROTON) 50 MG tablet   Oral   Take 50 mg by mouth daily.          Marland Kitchen CRANBERRY PO   Oral   Take 1 capsule by mouth 3 (three) times daily.         . diphenhydramine-acetaminophen (TYLENOL PM EXTRA STRENGTH) 25-500 MG TABS   Oral   Take 2 tablets by mouth at bedtime as  needed. Sleep/pain         . loperamide (IMODIUM) 2 MG capsule   Oral   Take 1-4 mg by mouth as needed. diarrhea         . losartan (COZAAR) 100 MG tablet   Oral   Take 100 mg by mouth daily.         . Meth-Hyo-M Bl-Na Phos-Ph Sal (URELLE PO)   Oral   Take 1 tablet by mouth 3 (three) times daily as needed (urinary tract).         . methenamine (HIPREX) 1 G tablet   Oral   Take 1 g by mouth 2 (two) times daily with a meal.         . metoprolol succinate (TOPROL-XL) 25 MG 24 hr tablet   Oral   Take 25 mg by mouth 2 (two) times daily.          . Multiple Vitamin (MULTIVITAMIN WITH MINERALS) TABS   Oral   Take 1 tablet by mouth daily.         . Multiple Vitamins-Minerals (ICAPS PO)   Oral   Take 1 capsule by mouth 2 (two) times daily.         . nitrofurantoin (MACRODANTIN) 100 MG capsule   Oral   Take 100 mg by mouth at bedtime.         . pantoprazole (PROTONIX) 40 MG tablet   Oral   Take 40 mg by mouth 2 (two) times daily.         Marland Kitchen warfarin (COUMADIN) 5 MG tablet   Oral   Take 5-7.5 mg by mouth daily. Tuesday, Thursday, and Saturday patient takes 1.5 tablets and 1 tablet on all other days         . levofloxacin (LEVAQUIN) 500 MG tablet   Oral   Take 1 tablet (500 mg total) by mouth daily.   7 tablet   0    BP 118/69  Pulse 68  Temp(Src) 98 F (36.7 C) (Oral)  Resp 14  SpO2 95% Physical Exam  Nursing note and vitals reviewed. Constitutional: She is oriented to person, place, and time. She appears well-developed and well-nourished. No distress.  HENT:  Head: Normocephalic and atraumatic.  Right Ear: External ear normal.  Left Ear: External ear normal.  Nose: Nose normal.  Mouth/Throat: Oropharynx is clear and moist. No oropharyngeal exudate.  Eyes: Conjunctivae and EOM are normal. Pupils are equal, round, and reactive to light. Right eye exhibits no discharge. Left eye exhibits no discharge.  Neck: Normal range of motion. Neck supple.  No JVD present. No tracheal deviation present. No thyromegaly present.  Cardiovascular: Normal rate, regular rhythm, normal heart sounds and intact distal pulses.  Exam reveals no gallop and no friction rub.   No murmur heard. Pulmonary/Chest: Effort normal and breath sounds normal. No respiratory distress. She has no wheezes. She has no rales. She exhibits  no tenderness.  Abdominal: Soft. Bowel sounds are normal. She exhibits no distension. There is no tenderness. There is no rebound and no guarding.  Musculoskeletal: Normal range of motion.  Lymphadenopathy:    She has no cervical adenopathy.  Neurological: She is alert and oriented to person, place, and time. A cranial nerve deficit (patient with right face paraylsis which is her baseline) is present. Coordination normal.  Skin: Skin is warm. No rash noted. She is not diaphoretic.  Psychiatric: She has a normal mood and affect. Her behavior is normal. Judgment and thought content normal.    ED Course   Procedures (including critical care time)  Labs Reviewed  CBC - Abnormal; Notable for the following:    RBC 3.78 (*)    Hemoglobin 10.2 (*)    HCT 29.8 (*)    All other components within normal limits  COMPREHENSIVE METABOLIC PANEL - Abnormal; Notable for the following:    Sodium 124 (*)    Chloride 90 (*)    Glucose, Bld 114 (*)    Total Bilirubin 0.2 (*)    GFR calc non Af Amer 58 (*)    GFR calc Af Amer 67 (*)    All other components within normal limits  URINALYSIS, ROUTINE W REFLEX MICROSCOPIC - Abnormal; Notable for the following:    Color, Urine GREEN (*)    APPearance CLOUDY (*)    Hgb urine dipstick MODERATE (*)    Nitrite POSITIVE (*)    Leukocytes, UA LARGE (*)    All other components within normal limits  URINE MICROSCOPIC-ADD ON - Abnormal; Notable for the following:    Squamous Epithelial / LPF FEW (*)    Bacteria, UA MANY (*)    All other components within normal limits  URINE CULTURE  MAGNESIUM  LACTIC  ACID, PLASMA   Dg Chest Port 1 View  11/09/2012   *RADIOLOGY REPORT*  Clinical Data: Hypotension  PORTABLE CHEST - 1 VIEW  Comparison: 09/10/2011  Findings: The heart and pulmonary within normal limits.  The lungs are clear bilaterally.  No focal confluent infiltrate is seen.  No bony abnormality is noted.  IMPRESSION: No acute abnormalities seen.   Original Report Authenticated By: Alcide Clever, M.D.   1. Urinary tract infection   2. Hypotension     MDM  79 yr old F pt here after episode where she briefly lost consciousness. Patient was recently diagnosed with UTI and was taking macrobid. She says she started feeling better then stopped the ABX's a couple of days ago. Today she says she has been feeling weak. She says she started feeling light headed and diaphoretic. The husband then says that the patient placed head down on table and was briefly unresponsive. EMS arrived and noticed thready pulses and a low blood pressure of 95/45. They gave the patient one liter of fluid and she quickly returned to baseline. Her blood pressure improved and so did her heart rate. Here she is back to baseline and has a normal neurological exam. She continues to have a urinary tract infection and has had them in the past. They have been well treated with levaquin in the past. Given the fact that the patient got so sick at home will admit to observation and Iv abx's  Case discussed with Dr. Catarina Hartshorn, MD 11/10/12 [phone]

## 2012-11-11 LAB — URINE CULTURE: Colony Count: >=100000

## 2012-11-16 LAB — CULTURE, BLOOD (ROUTINE X 2)
Culture: NO GROWTH
Culture: NO GROWTH

## 2013-01-23 ENCOUNTER — Ambulatory Visit: Payer: Self-pay | Admitting: Podiatry

## 2013-04-11 DIAGNOSIS — R82998 Other abnormal findings in urine: Secondary | ICD-10-CM | POA: Diagnosis not present

## 2013-04-11 DIAGNOSIS — E785 Hyperlipidemia, unspecified: Secondary | ICD-10-CM | POA: Diagnosis not present

## 2013-04-11 DIAGNOSIS — M810 Age-related osteoporosis without current pathological fracture: Secondary | ICD-10-CM | POA: Diagnosis not present

## 2013-04-11 DIAGNOSIS — I10 Essential (primary) hypertension: Secondary | ICD-10-CM | POA: Diagnosis not present

## 2013-04-18 DIAGNOSIS — H6120 Impacted cerumen, unspecified ear: Secondary | ICD-10-CM | POA: Diagnosis not present

## 2013-04-18 DIAGNOSIS — M545 Low back pain, unspecified: Secondary | ICD-10-CM | POA: Diagnosis not present

## 2013-04-18 DIAGNOSIS — H9190 Unspecified hearing loss, unspecified ear: Secondary | ICD-10-CM | POA: Diagnosis not present

## 2013-04-18 DIAGNOSIS — Z0000 Encounter for general adult medical examination without abnormal findings: Secondary | ICD-10-CM | POA: Diagnosis not present

## 2013-04-18 DIAGNOSIS — N390 Urinary tract infection, site not specified: Secondary | ICD-10-CM | POA: Diagnosis not present

## 2013-04-18 DIAGNOSIS — M810 Age-related osteoporosis without current pathological fracture: Secondary | ICD-10-CM | POA: Diagnosis not present

## 2013-04-18 DIAGNOSIS — Z7901 Long term (current) use of anticoagulants: Secondary | ICD-10-CM | POA: Diagnosis not present

## 2013-04-18 DIAGNOSIS — B070 Plantar wart: Secondary | ICD-10-CM | POA: Diagnosis not present

## 2013-04-19 DIAGNOSIS — Z7901 Long term (current) use of anticoagulants: Secondary | ICD-10-CM | POA: Diagnosis not present

## 2013-04-19 DIAGNOSIS — I4891 Unspecified atrial fibrillation: Secondary | ICD-10-CM | POA: Diagnosis not present

## 2013-05-22 DIAGNOSIS — L609 Nail disorder, unspecified: Secondary | ICD-10-CM | POA: Diagnosis not present

## 2013-05-22 DIAGNOSIS — M25579 Pain in unspecified ankle and joints of unspecified foot: Secondary | ICD-10-CM | POA: Diagnosis not present

## 2013-05-25 DIAGNOSIS — Z1331 Encounter for screening for depression: Secondary | ICD-10-CM | POA: Diagnosis not present

## 2013-05-25 DIAGNOSIS — D649 Anemia, unspecified: Secondary | ICD-10-CM | POA: Diagnosis not present

## 2013-05-25 DIAGNOSIS — Z7901 Long term (current) use of anticoagulants: Secondary | ICD-10-CM | POA: Diagnosis not present

## 2013-05-25 DIAGNOSIS — I4891 Unspecified atrial fibrillation: Secondary | ICD-10-CM | POA: Diagnosis not present

## 2013-05-25 DIAGNOSIS — I10 Essential (primary) hypertension: Secondary | ICD-10-CM | POA: Diagnosis not present

## 2013-05-25 DIAGNOSIS — Z6829 Body mass index (BMI) 29.0-29.9, adult: Secondary | ICD-10-CM | POA: Diagnosis not present

## 2013-06-07 ENCOUNTER — Ambulatory Visit (INDEPENDENT_AMBULATORY_CARE_PROVIDER_SITE_OTHER): Payer: Medicare Other | Admitting: Podiatry

## 2013-06-07 DIAGNOSIS — Q828 Other specified congenital malformations of skin: Secondary | ICD-10-CM

## 2013-06-07 DIAGNOSIS — M722 Plantar fascial fibromatosis: Secondary | ICD-10-CM | POA: Diagnosis not present

## 2013-06-08 NOTE — Progress Notes (Signed)
She presents today chief complaint of pain to her left heel.  Objective: Vital signs are stable she is alert and oriented x3. She pain on palpation medial continued tubercle of her left heel.  Assessment: His plantar fasciitis left foot.  Plan: Injected left heel today put her in a night splint left also debrided reactive hyperkeratosis for her today.

## 2013-06-11 DIAGNOSIS — Z8673 Personal history of transient ischemic attack (TIA), and cerebral infarction without residual deficits: Secondary | ICD-10-CM | POA: Diagnosis not present

## 2013-06-11 DIAGNOSIS — I4891 Unspecified atrial fibrillation: Secondary | ICD-10-CM | POA: Diagnosis not present

## 2013-06-11 DIAGNOSIS — I119 Hypertensive heart disease without heart failure: Secondary | ICD-10-CM | POA: Diagnosis not present

## 2013-06-11 DIAGNOSIS — Z7901 Long term (current) use of anticoagulants: Secondary | ICD-10-CM | POA: Diagnosis not present

## 2013-06-11 DIAGNOSIS — E785 Hyperlipidemia, unspecified: Secondary | ICD-10-CM | POA: Diagnosis not present

## 2013-06-11 DIAGNOSIS — K219 Gastro-esophageal reflux disease without esophagitis: Secondary | ICD-10-CM | POA: Diagnosis not present

## 2013-06-25 DIAGNOSIS — M7989 Other specified soft tissue disorders: Secondary | ICD-10-CM | POA: Diagnosis not present

## 2013-06-25 DIAGNOSIS — S92309A Fracture of unspecified metatarsal bone(s), unspecified foot, initial encounter for closed fracture: Secondary | ICD-10-CM | POA: Diagnosis not present

## 2013-06-25 DIAGNOSIS — M79609 Pain in unspecified limb: Secondary | ICD-10-CM | POA: Diagnosis not present

## 2013-06-25 DIAGNOSIS — S93409A Sprain of unspecified ligament of unspecified ankle, initial encounter: Secondary | ICD-10-CM | POA: Diagnosis not present

## 2013-06-26 DIAGNOSIS — Z7901 Long term (current) use of anticoagulants: Secondary | ICD-10-CM | POA: Diagnosis not present

## 2013-06-26 DIAGNOSIS — I4891 Unspecified atrial fibrillation: Secondary | ICD-10-CM | POA: Diagnosis not present

## 2013-07-03 ENCOUNTER — Ambulatory Visit: Payer: Medicare Other | Admitting: Podiatry

## 2013-07-16 DIAGNOSIS — S92309A Fracture of unspecified metatarsal bone(s), unspecified foot, initial encounter for closed fracture: Secondary | ICD-10-CM | POA: Diagnosis not present

## 2013-07-16 DIAGNOSIS — M7989 Other specified soft tissue disorders: Secondary | ICD-10-CM | POA: Diagnosis not present

## 2013-07-16 DIAGNOSIS — M79609 Pain in unspecified limb: Secondary | ICD-10-CM | POA: Diagnosis not present

## 2013-08-06 DIAGNOSIS — Z5189 Encounter for other specified aftercare: Secondary | ICD-10-CM | POA: Diagnosis not present

## 2013-08-06 DIAGNOSIS — S8290XD Unspecified fracture of unspecified lower leg, subsequent encounter for closed fracture with routine healing: Secondary | ICD-10-CM | POA: Diagnosis not present

## 2013-08-06 DIAGNOSIS — M79609 Pain in unspecified limb: Secondary | ICD-10-CM | POA: Diagnosis not present

## 2013-08-08 DIAGNOSIS — Z7901 Long term (current) use of anticoagulants: Secondary | ICD-10-CM | POA: Diagnosis not present

## 2013-08-08 DIAGNOSIS — I4891 Unspecified atrial fibrillation: Secondary | ICD-10-CM | POA: Diagnosis not present

## 2013-08-09 ENCOUNTER — Ambulatory Visit: Payer: Medicare Other | Attending: Sports Medicine

## 2013-08-09 DIAGNOSIS — M25676 Stiffness of unspecified foot, not elsewhere classified: Secondary | ICD-10-CM | POA: Insufficient documentation

## 2013-08-09 DIAGNOSIS — IMO0001 Reserved for inherently not codable concepts without codable children: Secondary | ICD-10-CM | POA: Diagnosis not present

## 2013-08-09 DIAGNOSIS — M25579 Pain in unspecified ankle and joints of unspecified foot: Secondary | ICD-10-CM | POA: Diagnosis not present

## 2013-08-09 DIAGNOSIS — R609 Edema, unspecified: Secondary | ICD-10-CM | POA: Insufficient documentation

## 2013-08-09 DIAGNOSIS — M25673 Stiffness of unspecified ankle, not elsewhere classified: Secondary | ICD-10-CM | POA: Insufficient documentation

## 2013-08-09 DIAGNOSIS — R262 Difficulty in walking, not elsewhere classified: Secondary | ICD-10-CM | POA: Insufficient documentation

## 2013-08-10 ENCOUNTER — Ambulatory Visit: Payer: Medicare Other | Admitting: Physical Therapy

## 2013-08-10 DIAGNOSIS — R609 Edema, unspecified: Secondary | ICD-10-CM | POA: Diagnosis not present

## 2013-08-10 DIAGNOSIS — M25579 Pain in unspecified ankle and joints of unspecified foot: Secondary | ICD-10-CM | POA: Diagnosis not present

## 2013-08-10 DIAGNOSIS — R262 Difficulty in walking, not elsewhere classified: Secondary | ICD-10-CM | POA: Diagnosis not present

## 2013-08-10 DIAGNOSIS — M25673 Stiffness of unspecified ankle, not elsewhere classified: Secondary | ICD-10-CM | POA: Diagnosis not present

## 2013-08-10 DIAGNOSIS — M25676 Stiffness of unspecified foot, not elsewhere classified: Secondary | ICD-10-CM | POA: Diagnosis not present

## 2013-08-10 DIAGNOSIS — IMO0001 Reserved for inherently not codable concepts without codable children: Secondary | ICD-10-CM | POA: Diagnosis not present

## 2013-08-13 ENCOUNTER — Ambulatory Visit: Payer: Medicare Other | Admitting: Physical Therapy

## 2013-08-13 DIAGNOSIS — M25579 Pain in unspecified ankle and joints of unspecified foot: Secondary | ICD-10-CM | POA: Diagnosis not present

## 2013-08-13 DIAGNOSIS — M25676 Stiffness of unspecified foot, not elsewhere classified: Secondary | ICD-10-CM | POA: Diagnosis not present

## 2013-08-13 DIAGNOSIS — R262 Difficulty in walking, not elsewhere classified: Secondary | ICD-10-CM | POA: Diagnosis not present

## 2013-08-13 DIAGNOSIS — IMO0001 Reserved for inherently not codable concepts without codable children: Secondary | ICD-10-CM | POA: Diagnosis not present

## 2013-08-13 DIAGNOSIS — R609 Edema, unspecified: Secondary | ICD-10-CM | POA: Diagnosis not present

## 2013-08-13 DIAGNOSIS — M25673 Stiffness of unspecified ankle, not elsewhere classified: Secondary | ICD-10-CM | POA: Diagnosis not present

## 2013-08-15 ENCOUNTER — Ambulatory Visit: Payer: Medicare Other | Admitting: Physical Therapy

## 2013-08-15 DIAGNOSIS — R609 Edema, unspecified: Secondary | ICD-10-CM | POA: Diagnosis not present

## 2013-08-15 DIAGNOSIS — M25676 Stiffness of unspecified foot, not elsewhere classified: Secondary | ICD-10-CM | POA: Diagnosis not present

## 2013-08-15 DIAGNOSIS — M25579 Pain in unspecified ankle and joints of unspecified foot: Secondary | ICD-10-CM | POA: Diagnosis not present

## 2013-08-15 DIAGNOSIS — M25673 Stiffness of unspecified ankle, not elsewhere classified: Secondary | ICD-10-CM | POA: Diagnosis not present

## 2013-08-15 DIAGNOSIS — R262 Difficulty in walking, not elsewhere classified: Secondary | ICD-10-CM | POA: Diagnosis not present

## 2013-08-15 DIAGNOSIS — IMO0001 Reserved for inherently not codable concepts without codable children: Secondary | ICD-10-CM | POA: Diagnosis not present

## 2013-08-17 ENCOUNTER — Ambulatory Visit: Payer: Medicare Other | Admitting: Physical Therapy

## 2013-08-17 DIAGNOSIS — R609 Edema, unspecified: Secondary | ICD-10-CM | POA: Diagnosis not present

## 2013-08-17 DIAGNOSIS — R262 Difficulty in walking, not elsewhere classified: Secondary | ICD-10-CM | POA: Diagnosis not present

## 2013-08-17 DIAGNOSIS — IMO0001 Reserved for inherently not codable concepts without codable children: Secondary | ICD-10-CM | POA: Diagnosis not present

## 2013-08-17 DIAGNOSIS — M25673 Stiffness of unspecified ankle, not elsewhere classified: Secondary | ICD-10-CM | POA: Diagnosis not present

## 2013-08-17 DIAGNOSIS — M25579 Pain in unspecified ankle and joints of unspecified foot: Secondary | ICD-10-CM | POA: Diagnosis not present

## 2013-08-27 ENCOUNTER — Ambulatory Visit: Payer: Medicare Other | Attending: Sports Medicine | Admitting: Physical Therapy

## 2013-08-27 DIAGNOSIS — M25676 Stiffness of unspecified foot, not elsewhere classified: Secondary | ICD-10-CM | POA: Diagnosis not present

## 2013-08-27 DIAGNOSIS — M25579 Pain in unspecified ankle and joints of unspecified foot: Secondary | ICD-10-CM | POA: Insufficient documentation

## 2013-08-27 DIAGNOSIS — IMO0001 Reserved for inherently not codable concepts without codable children: Secondary | ICD-10-CM | POA: Diagnosis not present

## 2013-08-27 DIAGNOSIS — M25673 Stiffness of unspecified ankle, not elsewhere classified: Secondary | ICD-10-CM | POA: Diagnosis not present

## 2013-08-27 DIAGNOSIS — R262 Difficulty in walking, not elsewhere classified: Secondary | ICD-10-CM | POA: Insufficient documentation

## 2013-08-27 DIAGNOSIS — R609 Edema, unspecified: Secondary | ICD-10-CM | POA: Insufficient documentation

## 2013-08-29 ENCOUNTER — Ambulatory Visit: Payer: Medicare Other | Admitting: Physical Therapy

## 2013-08-29 DIAGNOSIS — IMO0001 Reserved for inherently not codable concepts without codable children: Secondary | ICD-10-CM | POA: Diagnosis not present

## 2013-09-03 ENCOUNTER — Ambulatory Visit: Payer: Medicare Other

## 2013-09-03 DIAGNOSIS — M79609 Pain in unspecified limb: Secondary | ICD-10-CM | POA: Diagnosis not present

## 2013-09-03 DIAGNOSIS — M216X9 Other acquired deformities of unspecified foot: Secondary | ICD-10-CM | POA: Diagnosis not present

## 2013-09-03 DIAGNOSIS — S8290XD Unspecified fracture of unspecified lower leg, subsequent encounter for closed fracture with routine healing: Secondary | ICD-10-CM | POA: Diagnosis not present

## 2013-09-03 DIAGNOSIS — IMO0001 Reserved for inherently not codable concepts without codable children: Secondary | ICD-10-CM | POA: Diagnosis not present

## 2013-09-03 DIAGNOSIS — M2140 Flat foot [pes planus] (acquired), unspecified foot: Secondary | ICD-10-CM | POA: Diagnosis not present

## 2013-09-05 DIAGNOSIS — M545 Low back pain, unspecified: Secondary | ICD-10-CM | POA: Diagnosis not present

## 2013-09-05 DIAGNOSIS — M999 Biomechanical lesion, unspecified: Secondary | ICD-10-CM | POA: Diagnosis not present

## 2013-09-06 ENCOUNTER — Ambulatory Visit: Payer: Medicare Other | Admitting: Physical Therapy

## 2013-09-06 DIAGNOSIS — IMO0001 Reserved for inherently not codable concepts without codable children: Secondary | ICD-10-CM | POA: Diagnosis not present

## 2013-09-10 ENCOUNTER — Ambulatory Visit: Payer: Medicare Other

## 2013-09-10 DIAGNOSIS — IMO0001 Reserved for inherently not codable concepts without codable children: Secondary | ICD-10-CM | POA: Diagnosis not present

## 2013-09-17 ENCOUNTER — Ambulatory Visit: Payer: Medicare Other | Admitting: Physical Therapy

## 2013-09-17 DIAGNOSIS — IMO0001 Reserved for inherently not codable concepts without codable children: Secondary | ICD-10-CM | POA: Diagnosis not present

## 2013-09-17 DIAGNOSIS — M545 Low back pain, unspecified: Secondary | ICD-10-CM | POA: Diagnosis not present

## 2013-09-17 DIAGNOSIS — M999 Biomechanical lesion, unspecified: Secondary | ICD-10-CM | POA: Diagnosis not present

## 2013-09-18 ENCOUNTER — Ambulatory Visit: Payer: Medicare Other | Admitting: Podiatry

## 2013-09-18 DIAGNOSIS — Z471 Aftercare following joint replacement surgery: Secondary | ICD-10-CM | POA: Diagnosis not present

## 2013-09-18 DIAGNOSIS — Z96659 Presence of unspecified artificial knee joint: Secondary | ICD-10-CM | POA: Diagnosis not present

## 2013-09-19 DIAGNOSIS — I4891 Unspecified atrial fibrillation: Secondary | ICD-10-CM | POA: Diagnosis not present

## 2013-09-19 DIAGNOSIS — Z7901 Long term (current) use of anticoagulants: Secondary | ICD-10-CM | POA: Diagnosis not present

## 2013-09-20 ENCOUNTER — Ambulatory Visit: Payer: Medicare Other

## 2013-09-20 DIAGNOSIS — M545 Low back pain, unspecified: Secondary | ICD-10-CM | POA: Diagnosis not present

## 2013-09-20 DIAGNOSIS — IMO0001 Reserved for inherently not codable concepts without codable children: Secondary | ICD-10-CM | POA: Diagnosis not present

## 2013-09-20 DIAGNOSIS — M999 Biomechanical lesion, unspecified: Secondary | ICD-10-CM | POA: Diagnosis not present

## 2013-09-21 DIAGNOSIS — H905 Unspecified sensorineural hearing loss: Secondary | ICD-10-CM | POA: Diagnosis not present

## 2013-09-21 DIAGNOSIS — G4733 Obstructive sleep apnea (adult) (pediatric): Secondary | ICD-10-CM | POA: Diagnosis not present

## 2013-09-21 DIAGNOSIS — H903 Sensorineural hearing loss, bilateral: Secondary | ICD-10-CM | POA: Diagnosis not present

## 2013-09-21 DIAGNOSIS — H908 Mixed conductive and sensorineural hearing loss, unspecified: Secondary | ICD-10-CM | POA: Diagnosis not present

## 2013-09-21 DIAGNOSIS — H6120 Impacted cerumen, unspecified ear: Secondary | ICD-10-CM | POA: Diagnosis not present

## 2013-09-24 ENCOUNTER — Ambulatory Visit: Payer: Medicare Other

## 2013-09-24 DIAGNOSIS — N281 Cyst of kidney, acquired: Secondary | ICD-10-CM | POA: Diagnosis not present

## 2013-09-24 DIAGNOSIS — N952 Postmenopausal atrophic vaginitis: Secondary | ICD-10-CM | POA: Diagnosis not present

## 2013-09-24 DIAGNOSIS — N3020 Other chronic cystitis without hematuria: Secondary | ICD-10-CM | POA: Diagnosis not present

## 2013-09-24 DIAGNOSIS — R300 Dysuria: Secondary | ICD-10-CM | POA: Diagnosis not present

## 2013-09-25 ENCOUNTER — Ambulatory Visit: Payer: Medicare Other | Admitting: Podiatry

## 2013-09-27 ENCOUNTER — Ambulatory Visit: Payer: Medicare Other | Attending: Sports Medicine | Admitting: Physical Therapy

## 2013-09-27 DIAGNOSIS — M25676 Stiffness of unspecified foot, not elsewhere classified: Secondary | ICD-10-CM | POA: Insufficient documentation

## 2013-09-27 DIAGNOSIS — M25579 Pain in unspecified ankle and joints of unspecified foot: Secondary | ICD-10-CM | POA: Diagnosis not present

## 2013-09-27 DIAGNOSIS — R262 Difficulty in walking, not elsewhere classified: Secondary | ICD-10-CM | POA: Diagnosis not present

## 2013-09-27 DIAGNOSIS — R609 Edema, unspecified: Secondary | ICD-10-CM | POA: Insufficient documentation

## 2013-09-27 DIAGNOSIS — IMO0001 Reserved for inherently not codable concepts without codable children: Secondary | ICD-10-CM | POA: Diagnosis not present

## 2013-09-27 DIAGNOSIS — M25673 Stiffness of unspecified ankle, not elsewhere classified: Secondary | ICD-10-CM | POA: Diagnosis not present

## 2013-10-01 ENCOUNTER — Ambulatory Visit: Payer: Medicare Other | Admitting: Physical Therapy

## 2013-10-01 DIAGNOSIS — IMO0001 Reserved for inherently not codable concepts without codable children: Secondary | ICD-10-CM | POA: Diagnosis not present

## 2013-10-01 DIAGNOSIS — M545 Low back pain, unspecified: Secondary | ICD-10-CM | POA: Diagnosis not present

## 2013-10-01 DIAGNOSIS — M999 Biomechanical lesion, unspecified: Secondary | ICD-10-CM | POA: Diagnosis not present

## 2013-10-03 DIAGNOSIS — I4891 Unspecified atrial fibrillation: Secondary | ICD-10-CM | POA: Diagnosis not present

## 2013-10-03 DIAGNOSIS — Z7901 Long term (current) use of anticoagulants: Secondary | ICD-10-CM | POA: Diagnosis not present

## 2013-10-04 ENCOUNTER — Ambulatory Visit: Payer: Medicare Other

## 2013-10-04 DIAGNOSIS — M545 Low back pain, unspecified: Secondary | ICD-10-CM | POA: Diagnosis not present

## 2013-10-04 DIAGNOSIS — M999 Biomechanical lesion, unspecified: Secondary | ICD-10-CM | POA: Diagnosis not present

## 2013-10-04 DIAGNOSIS — IMO0001 Reserved for inherently not codable concepts without codable children: Secondary | ICD-10-CM | POA: Diagnosis present

## 2013-10-04 DIAGNOSIS — R262 Difficulty in walking, not elsewhere classified: Secondary | ICD-10-CM | POA: Diagnosis not present

## 2013-10-04 DIAGNOSIS — R609 Edema, unspecified: Secondary | ICD-10-CM | POA: Diagnosis not present

## 2013-10-04 DIAGNOSIS — M25579 Pain in unspecified ankle and joints of unspecified foot: Secondary | ICD-10-CM | POA: Diagnosis not present

## 2013-10-04 DIAGNOSIS — M25676 Stiffness of unspecified foot, not elsewhere classified: Secondary | ICD-10-CM | POA: Diagnosis not present

## 2013-10-04 DIAGNOSIS — M25673 Stiffness of unspecified ankle, not elsewhere classified: Secondary | ICD-10-CM | POA: Diagnosis not present

## 2013-10-08 ENCOUNTER — Ambulatory Visit: Payer: Medicare Other | Attending: Orthopedic Surgery

## 2013-10-08 DIAGNOSIS — Z9889 Other specified postprocedural states: Secondary | ICD-10-CM | POA: Diagnosis not present

## 2013-10-08 DIAGNOSIS — IMO0001 Reserved for inherently not codable concepts without codable children: Secondary | ICD-10-CM | POA: Insufficient documentation

## 2013-10-08 DIAGNOSIS — R5381 Other malaise: Secondary | ICD-10-CM | POA: Diagnosis not present

## 2013-10-08 DIAGNOSIS — M6281 Muscle weakness (generalized): Secondary | ICD-10-CM | POA: Insufficient documentation

## 2013-10-08 DIAGNOSIS — M899 Disorder of bone, unspecified: Secondary | ICD-10-CM | POA: Diagnosis not present

## 2013-10-08 DIAGNOSIS — M949 Disorder of cartilage, unspecified: Secondary | ICD-10-CM | POA: Diagnosis not present

## 2013-10-11 ENCOUNTER — Ambulatory Visit: Payer: Medicare Other | Admitting: Physical Therapy

## 2013-10-11 DIAGNOSIS — M6281 Muscle weakness (generalized): Secondary | ICD-10-CM | POA: Diagnosis not present

## 2013-10-11 DIAGNOSIS — M949 Disorder of cartilage, unspecified: Secondary | ICD-10-CM | POA: Diagnosis not present

## 2013-10-11 DIAGNOSIS — R5381 Other malaise: Secondary | ICD-10-CM | POA: Diagnosis not present

## 2013-10-11 DIAGNOSIS — IMO0001 Reserved for inherently not codable concepts without codable children: Secondary | ICD-10-CM | POA: Diagnosis not present

## 2013-10-11 DIAGNOSIS — Z9889 Other specified postprocedural states: Secondary | ICD-10-CM | POA: Diagnosis not present

## 2013-10-11 DIAGNOSIS — M899 Disorder of bone, unspecified: Secondary | ICD-10-CM | POA: Diagnosis not present

## 2013-10-15 ENCOUNTER — Other Ambulatory Visit: Payer: Self-pay | Admitting: Dermatology

## 2013-10-15 ENCOUNTER — Ambulatory Visit: Payer: Medicare Other | Admitting: Physical Therapy

## 2013-10-15 DIAGNOSIS — Z9889 Other specified postprocedural states: Secondary | ICD-10-CM | POA: Diagnosis not present

## 2013-10-15 DIAGNOSIS — D239 Other benign neoplasm of skin, unspecified: Secondary | ICD-10-CM | POA: Diagnosis not present

## 2013-10-15 DIAGNOSIS — IMO0001 Reserved for inherently not codable concepts without codable children: Secondary | ICD-10-CM | POA: Diagnosis not present

## 2013-10-15 DIAGNOSIS — L821 Other seborrheic keratosis: Secondary | ICD-10-CM | POA: Diagnosis not present

## 2013-10-15 DIAGNOSIS — L820 Inflamed seborrheic keratosis: Secondary | ICD-10-CM | POA: Diagnosis not present

## 2013-10-15 DIAGNOSIS — R5381 Other malaise: Secondary | ICD-10-CM | POA: Diagnosis not present

## 2013-10-15 DIAGNOSIS — Z85828 Personal history of other malignant neoplasm of skin: Secondary | ICD-10-CM | POA: Diagnosis not present

## 2013-10-15 DIAGNOSIS — Z411 Encounter for cosmetic surgery: Secondary | ICD-10-CM | POA: Diagnosis not present

## 2013-10-15 DIAGNOSIS — D485 Neoplasm of uncertain behavior of skin: Secondary | ICD-10-CM | POA: Diagnosis not present

## 2013-10-15 DIAGNOSIS — M6281 Muscle weakness (generalized): Secondary | ICD-10-CM | POA: Diagnosis not present

## 2013-10-15 DIAGNOSIS — M899 Disorder of bone, unspecified: Secondary | ICD-10-CM | POA: Diagnosis not present

## 2013-10-15 DIAGNOSIS — L608 Other nail disorders: Secondary | ICD-10-CM | POA: Diagnosis not present

## 2013-10-17 DIAGNOSIS — C4441 Basal cell carcinoma of skin of scalp and neck: Secondary | ICD-10-CM | POA: Diagnosis not present

## 2013-10-18 ENCOUNTER — Ambulatory Visit: Payer: Medicare Other | Admitting: Physical Therapy

## 2013-10-18 DIAGNOSIS — Z9889 Other specified postprocedural states: Secondary | ICD-10-CM | POA: Diagnosis not present

## 2013-10-18 DIAGNOSIS — M6281 Muscle weakness (generalized): Secondary | ICD-10-CM | POA: Diagnosis not present

## 2013-10-18 DIAGNOSIS — M899 Disorder of bone, unspecified: Secondary | ICD-10-CM | POA: Diagnosis not present

## 2013-10-18 DIAGNOSIS — R5381 Other malaise: Secondary | ICD-10-CM | POA: Diagnosis not present

## 2013-10-18 DIAGNOSIS — M949 Disorder of cartilage, unspecified: Secondary | ICD-10-CM | POA: Diagnosis not present

## 2013-10-18 DIAGNOSIS — IMO0001 Reserved for inherently not codable concepts without codable children: Secondary | ICD-10-CM | POA: Diagnosis not present

## 2013-10-22 ENCOUNTER — Ambulatory Visit: Payer: Medicare Other

## 2013-10-22 DIAGNOSIS — IMO0001 Reserved for inherently not codable concepts without codable children: Secondary | ICD-10-CM | POA: Diagnosis not present

## 2013-10-22 DIAGNOSIS — M6281 Muscle weakness (generalized): Secondary | ICD-10-CM | POA: Diagnosis not present

## 2013-10-22 DIAGNOSIS — M899 Disorder of bone, unspecified: Secondary | ICD-10-CM | POA: Diagnosis not present

## 2013-10-22 DIAGNOSIS — R5381 Other malaise: Secondary | ICD-10-CM | POA: Diagnosis not present

## 2013-10-22 DIAGNOSIS — Z9889 Other specified postprocedural states: Secondary | ICD-10-CM | POA: Diagnosis not present

## 2013-10-22 DIAGNOSIS — M949 Disorder of cartilage, unspecified: Secondary | ICD-10-CM | POA: Diagnosis not present

## 2013-10-25 ENCOUNTER — Ambulatory Visit: Payer: Medicare Other | Admitting: Physical Therapy

## 2013-10-25 DIAGNOSIS — M6281 Muscle weakness (generalized): Secondary | ICD-10-CM | POA: Diagnosis not present

## 2013-10-25 DIAGNOSIS — Z9889 Other specified postprocedural states: Secondary | ICD-10-CM | POA: Diagnosis not present

## 2013-10-25 DIAGNOSIS — IMO0001 Reserved for inherently not codable concepts without codable children: Secondary | ICD-10-CM | POA: Diagnosis not present

## 2013-10-25 DIAGNOSIS — M899 Disorder of bone, unspecified: Secondary | ICD-10-CM | POA: Diagnosis not present

## 2013-10-25 DIAGNOSIS — R5381 Other malaise: Secondary | ICD-10-CM | POA: Diagnosis not present

## 2013-10-29 ENCOUNTER — Ambulatory Visit: Payer: Medicare Other | Attending: Orthopedic Surgery | Admitting: Physical Therapy

## 2013-10-29 DIAGNOSIS — R5381 Other malaise: Secondary | ICD-10-CM | POA: Insufficient documentation

## 2013-10-29 DIAGNOSIS — M949 Disorder of cartilage, unspecified: Secondary | ICD-10-CM

## 2013-10-29 DIAGNOSIS — Z9889 Other specified postprocedural states: Secondary | ICD-10-CM | POA: Insufficient documentation

## 2013-10-29 DIAGNOSIS — IMO0001 Reserved for inherently not codable concepts without codable children: Secondary | ICD-10-CM | POA: Insufficient documentation

## 2013-10-29 DIAGNOSIS — M899 Disorder of bone, unspecified: Secondary | ICD-10-CM | POA: Insufficient documentation

## 2013-10-29 DIAGNOSIS — M6281 Muscle weakness (generalized): Secondary | ICD-10-CM | POA: Diagnosis not present

## 2013-10-30 DIAGNOSIS — Z471 Aftercare following joint replacement surgery: Secondary | ICD-10-CM | POA: Diagnosis not present

## 2013-10-31 DIAGNOSIS — Z961 Presence of intraocular lens: Secondary | ICD-10-CM | POA: Diagnosis not present

## 2013-11-01 ENCOUNTER — Ambulatory Visit: Payer: Medicare Other | Admitting: Physical Therapy

## 2013-11-01 DIAGNOSIS — IMO0001 Reserved for inherently not codable concepts without codable children: Secondary | ICD-10-CM | POA: Diagnosis not present

## 2013-11-05 ENCOUNTER — Ambulatory Visit: Payer: Medicare Other | Admitting: Physical Therapy

## 2013-11-05 DIAGNOSIS — IMO0001 Reserved for inherently not codable concepts without codable children: Secondary | ICD-10-CM | POA: Diagnosis not present

## 2013-11-08 ENCOUNTER — Ambulatory Visit: Payer: Medicare Other | Admitting: Physical Therapy

## 2013-11-08 DIAGNOSIS — IMO0001 Reserved for inherently not codable concepts without codable children: Secondary | ICD-10-CM | POA: Diagnosis not present

## 2013-11-12 ENCOUNTER — Ambulatory Visit: Payer: Medicare Other | Admitting: Physical Therapy

## 2013-11-12 DIAGNOSIS — IMO0001 Reserved for inherently not codable concepts without codable children: Secondary | ICD-10-CM | POA: Diagnosis not present

## 2013-11-14 DIAGNOSIS — N390 Urinary tract infection, site not specified: Secondary | ICD-10-CM | POA: Diagnosis not present

## 2013-11-15 ENCOUNTER — Ambulatory Visit: Payer: Medicare Other

## 2013-11-15 DIAGNOSIS — IMO0001 Reserved for inherently not codable concepts without codable children: Secondary | ICD-10-CM | POA: Diagnosis not present

## 2013-11-19 ENCOUNTER — Ambulatory Visit: Payer: Medicare Other | Admitting: Physical Therapy

## 2013-11-19 DIAGNOSIS — IMO0001 Reserved for inherently not codable concepts without codable children: Secondary | ICD-10-CM | POA: Diagnosis not present

## 2013-11-20 DIAGNOSIS — Z7901 Long term (current) use of anticoagulants: Secondary | ICD-10-CM | POA: Diagnosis not present

## 2013-11-20 DIAGNOSIS — I4891 Unspecified atrial fibrillation: Secondary | ICD-10-CM | POA: Diagnosis not present

## 2013-11-20 DIAGNOSIS — N3020 Other chronic cystitis without hematuria: Secondary | ICD-10-CM | POA: Diagnosis not present

## 2013-11-22 ENCOUNTER — Ambulatory Visit: Payer: Medicare Other

## 2013-11-22 DIAGNOSIS — IMO0001 Reserved for inherently not codable concepts without codable children: Secondary | ICD-10-CM | POA: Diagnosis not present

## 2013-11-22 DIAGNOSIS — C44519 Basal cell carcinoma of skin of other part of trunk: Secondary | ICD-10-CM | POA: Diagnosis not present

## 2013-11-26 ENCOUNTER — Ambulatory Visit: Payer: Medicare Other | Admitting: Physical Therapy

## 2013-11-26 DIAGNOSIS — IMO0001 Reserved for inherently not codable concepts without codable children: Secondary | ICD-10-CM | POA: Diagnosis not present

## 2013-11-27 DIAGNOSIS — K591 Functional diarrhea: Secondary | ICD-10-CM | POA: Diagnosis not present

## 2013-11-29 ENCOUNTER — Ambulatory Visit: Payer: Medicare Other | Attending: Orthopedic Surgery | Admitting: Physical Therapy

## 2013-11-29 DIAGNOSIS — M6281 Muscle weakness (generalized): Secondary | ICD-10-CM | POA: Diagnosis not present

## 2013-11-29 DIAGNOSIS — Z9889 Other specified postprocedural states: Secondary | ICD-10-CM | POA: Insufficient documentation

## 2013-11-29 DIAGNOSIS — M949 Disorder of cartilage, unspecified: Secondary | ICD-10-CM

## 2013-11-29 DIAGNOSIS — M899 Disorder of bone, unspecified: Secondary | ICD-10-CM | POA: Diagnosis not present

## 2013-11-29 DIAGNOSIS — R5381 Other malaise: Secondary | ICD-10-CM | POA: Insufficient documentation

## 2013-11-29 DIAGNOSIS — IMO0001 Reserved for inherently not codable concepts without codable children: Secondary | ICD-10-CM | POA: Diagnosis not present

## 2013-12-05 DIAGNOSIS — N3020 Other chronic cystitis without hematuria: Secondary | ICD-10-CM | POA: Diagnosis not present

## 2013-12-05 DIAGNOSIS — R300 Dysuria: Secondary | ICD-10-CM | POA: Diagnosis not present

## 2013-12-07 DIAGNOSIS — Z7901 Long term (current) use of anticoagulants: Secondary | ICD-10-CM | POA: Diagnosis not present

## 2013-12-07 DIAGNOSIS — N390 Urinary tract infection, site not specified: Secondary | ICD-10-CM | POA: Diagnosis not present

## 2013-12-07 DIAGNOSIS — I4891 Unspecified atrial fibrillation: Secondary | ICD-10-CM | POA: Diagnosis not present

## 2013-12-13 DIAGNOSIS — H1130 Conjunctival hemorrhage, unspecified eye: Secondary | ICD-10-CM | POA: Diagnosis not present

## 2013-12-13 DIAGNOSIS — I10 Essential (primary) hypertension: Secondary | ICD-10-CM | POA: Diagnosis not present

## 2013-12-13 DIAGNOSIS — I4891 Unspecified atrial fibrillation: Secondary | ICD-10-CM | POA: Diagnosis not present

## 2013-12-13 DIAGNOSIS — Z7901 Long term (current) use of anticoagulants: Secondary | ICD-10-CM | POA: Diagnosis not present

## 2013-12-13 DIAGNOSIS — Z6829 Body mass index (BMI) 29.0-29.9, adult: Secondary | ICD-10-CM | POA: Diagnosis not present

## 2013-12-18 DIAGNOSIS — K589 Irritable bowel syndrome without diarrhea: Secondary | ICD-10-CM | POA: Diagnosis not present

## 2013-12-20 DIAGNOSIS — R1310 Dysphagia, unspecified: Secondary | ICD-10-CM | POA: Diagnosis not present

## 2013-12-20 DIAGNOSIS — R059 Cough, unspecified: Secondary | ICD-10-CM | POA: Diagnosis not present

## 2013-12-20 DIAGNOSIS — I10 Essential (primary) hypertension: Secondary | ICD-10-CM | POA: Diagnosis not present

## 2013-12-20 DIAGNOSIS — R197 Diarrhea, unspecified: Secondary | ICD-10-CM | POA: Diagnosis not present

## 2013-12-20 DIAGNOSIS — I4891 Unspecified atrial fibrillation: Secondary | ICD-10-CM | POA: Diagnosis not present

## 2013-12-20 DIAGNOSIS — K219 Gastro-esophageal reflux disease without esophagitis: Secondary | ICD-10-CM | POA: Diagnosis not present

## 2013-12-20 DIAGNOSIS — J310 Chronic rhinitis: Secondary | ICD-10-CM | POA: Diagnosis not present

## 2013-12-20 DIAGNOSIS — Z23 Encounter for immunization: Secondary | ICD-10-CM | POA: Diagnosis not present

## 2013-12-20 DIAGNOSIS — R05 Cough: Secondary | ICD-10-CM | POA: Diagnosis not present

## 2013-12-20 DIAGNOSIS — Z7901 Long term (current) use of anticoagulants: Secondary | ICD-10-CM | POA: Diagnosis not present

## 2013-12-27 DIAGNOSIS — K219 Gastro-esophageal reflux disease without esophagitis: Secondary | ICD-10-CM | POA: Diagnosis not present

## 2013-12-27 DIAGNOSIS — I480 Paroxysmal atrial fibrillation: Secondary | ICD-10-CM | POA: Diagnosis not present

## 2013-12-27 DIAGNOSIS — Z7901 Long term (current) use of anticoagulants: Secondary | ICD-10-CM | POA: Diagnosis not present

## 2013-12-27 DIAGNOSIS — E785 Hyperlipidemia, unspecified: Secondary | ICD-10-CM | POA: Diagnosis not present

## 2013-12-27 DIAGNOSIS — I119 Hypertensive heart disease without heart failure: Secondary | ICD-10-CM | POA: Diagnosis not present

## 2013-12-27 DIAGNOSIS — Z8673 Personal history of transient ischemic attack (TIA), and cerebral infarction without residual deficits: Secondary | ICD-10-CM | POA: Diagnosis not present

## 2014-01-09 DIAGNOSIS — I480 Paroxysmal atrial fibrillation: Secondary | ICD-10-CM | POA: Diagnosis not present

## 2014-01-09 DIAGNOSIS — Z7901 Long term (current) use of anticoagulants: Secondary | ICD-10-CM | POA: Diagnosis not present

## 2014-01-23 DIAGNOSIS — Z7901 Long term (current) use of anticoagulants: Secondary | ICD-10-CM | POA: Diagnosis not present

## 2014-01-23 DIAGNOSIS — I480 Paroxysmal atrial fibrillation: Secondary | ICD-10-CM | POA: Diagnosis not present

## 2014-01-24 DIAGNOSIS — Z961 Presence of intraocular lens: Secondary | ICD-10-CM | POA: Diagnosis not present

## 2014-01-24 DIAGNOSIS — H04123 Dry eye syndrome of bilateral lacrimal glands: Secondary | ICD-10-CM | POA: Diagnosis not present

## 2014-01-24 DIAGNOSIS — H33021 Retinal detachment with multiple breaks, right eye: Secondary | ICD-10-CM | POA: Diagnosis not present

## 2014-01-24 DIAGNOSIS — H3531 Nonexudative age-related macular degeneration: Secondary | ICD-10-CM | POA: Diagnosis not present

## 2014-01-24 DIAGNOSIS — H26491 Other secondary cataract, right eye: Secondary | ICD-10-CM | POA: Diagnosis not present

## 2014-02-11 DIAGNOSIS — K589 Irritable bowel syndrome without diarrhea: Secondary | ICD-10-CM | POA: Diagnosis not present

## 2014-02-26 DIAGNOSIS — Z7901 Long term (current) use of anticoagulants: Secondary | ICD-10-CM | POA: Diagnosis not present

## 2014-02-26 DIAGNOSIS — I480 Paroxysmal atrial fibrillation: Secondary | ICD-10-CM | POA: Diagnosis not present

## 2014-03-13 DIAGNOSIS — N390 Urinary tract infection, site not specified: Secondary | ICD-10-CM | POA: Diagnosis not present

## 2014-03-25 DIAGNOSIS — J0100 Acute maxillary sinusitis, unspecified: Secondary | ICD-10-CM | POA: Diagnosis not present

## 2014-03-25 DIAGNOSIS — I480 Paroxysmal atrial fibrillation: Secondary | ICD-10-CM | POA: Diagnosis not present

## 2014-03-25 DIAGNOSIS — Z7901 Long term (current) use of anticoagulants: Secondary | ICD-10-CM | POA: Diagnosis not present

## 2014-03-25 DIAGNOSIS — Z6830 Body mass index (BMI) 30.0-30.9, adult: Secondary | ICD-10-CM | POA: Diagnosis not present

## 2014-03-27 DIAGNOSIS — J0100 Acute maxillary sinusitis, unspecified: Secondary | ICD-10-CM | POA: Diagnosis not present

## 2014-03-27 DIAGNOSIS — Z6830 Body mass index (BMI) 30.0-30.9, adult: Secondary | ICD-10-CM | POA: Diagnosis not present

## 2014-04-01 DIAGNOSIS — H66002 Acute suppurative otitis media without spontaneous rupture of ear drum, left ear: Secondary | ICD-10-CM | POA: Diagnosis not present

## 2014-04-01 DIAGNOSIS — H6122 Impacted cerumen, left ear: Secondary | ICD-10-CM | POA: Diagnosis not present

## 2014-04-01 DIAGNOSIS — H905 Unspecified sensorineural hearing loss: Secondary | ICD-10-CM | POA: Diagnosis not present

## 2014-04-02 DIAGNOSIS — R350 Frequency of micturition: Secondary | ICD-10-CM | POA: Diagnosis not present

## 2014-04-02 DIAGNOSIS — N390 Urinary tract infection, site not specified: Secondary | ICD-10-CM | POA: Diagnosis not present

## 2014-04-02 DIAGNOSIS — R300 Dysuria: Secondary | ICD-10-CM | POA: Diagnosis not present

## 2014-04-02 DIAGNOSIS — N3020 Other chronic cystitis without hematuria: Secondary | ICD-10-CM | POA: Diagnosis not present

## 2014-04-12 DIAGNOSIS — M859 Disorder of bone density and structure, unspecified: Secondary | ICD-10-CM | POA: Diagnosis not present

## 2014-04-12 DIAGNOSIS — I10 Essential (primary) hypertension: Secondary | ICD-10-CM | POA: Diagnosis not present

## 2014-04-12 DIAGNOSIS — Z0000 Encounter for general adult medical examination without abnormal findings: Secondary | ICD-10-CM | POA: Diagnosis not present

## 2014-04-12 DIAGNOSIS — E785 Hyperlipidemia, unspecified: Secondary | ICD-10-CM | POA: Diagnosis not present

## 2014-04-16 DIAGNOSIS — R300 Dysuria: Secondary | ICD-10-CM | POA: Diagnosis not present

## 2014-04-16 DIAGNOSIS — R350 Frequency of micturition: Secondary | ICD-10-CM | POA: Diagnosis not present

## 2014-04-16 DIAGNOSIS — N390 Urinary tract infection, site not specified: Secondary | ICD-10-CM | POA: Diagnosis not present

## 2014-04-16 DIAGNOSIS — N3020 Other chronic cystitis without hematuria: Secondary | ICD-10-CM | POA: Diagnosis not present

## 2014-04-17 DIAGNOSIS — H9042 Sensorineural hearing loss, unilateral, left ear, with unrestricted hearing on the contralateral side: Secondary | ICD-10-CM | POA: Diagnosis not present

## 2014-04-17 DIAGNOSIS — H9071 Mixed conductive and sensorineural hearing loss, unilateral, right ear, with unrestricted hearing on the contralateral side: Secondary | ICD-10-CM | POA: Diagnosis not present

## 2014-04-17 DIAGNOSIS — H903 Sensorineural hearing loss, bilateral: Secondary | ICD-10-CM | POA: Diagnosis not present

## 2014-04-18 ENCOUNTER — Ambulatory Visit (INDEPENDENT_AMBULATORY_CARE_PROVIDER_SITE_OTHER): Payer: Medicare Other | Admitting: Podiatry

## 2014-04-18 ENCOUNTER — Encounter: Payer: Self-pay | Admitting: Podiatry

## 2014-04-18 DIAGNOSIS — L603 Nail dystrophy: Secondary | ICD-10-CM

## 2014-04-18 DIAGNOSIS — M79676 Pain in unspecified toe(s): Secondary | ICD-10-CM | POA: Diagnosis not present

## 2014-04-18 DIAGNOSIS — B351 Tinea unguium: Secondary | ICD-10-CM

## 2014-04-18 NOTE — Progress Notes (Signed)
She presents today with chief complaint of painful elongated toenails.  Objective: Pulses are strongly palpable bilateral. Nails are thick yellow dystrophic mycotic and painful palpation.  Assessment: Pain in limb secondary to onychomycosis 1 through 5 bilateral.  Plan: Debridement of nails 1 through 5 bilateral covered service secondary to pain.

## 2014-05-13 DIAGNOSIS — N183 Chronic kidney disease, stage 3 (moderate): Secondary | ICD-10-CM | POA: Diagnosis not present

## 2014-05-13 DIAGNOSIS — I10 Essential (primary) hypertension: Secondary | ICD-10-CM | POA: Diagnosis not present

## 2014-05-13 DIAGNOSIS — Z0000 Encounter for general adult medical examination without abnormal findings: Secondary | ICD-10-CM | POA: Diagnosis not present

## 2014-05-13 DIAGNOSIS — Z6829 Body mass index (BMI) 29.0-29.9, adult: Secondary | ICD-10-CM | POA: Diagnosis not present

## 2014-05-13 DIAGNOSIS — M545 Low back pain: Secondary | ICD-10-CM | POA: Diagnosis not present

## 2014-05-13 DIAGNOSIS — Z1389 Encounter for screening for other disorder: Secondary | ICD-10-CM | POA: Diagnosis not present

## 2014-05-13 DIAGNOSIS — J310 Chronic rhinitis: Secondary | ICD-10-CM | POA: Diagnosis not present

## 2014-05-13 DIAGNOSIS — M810 Age-related osteoporosis without current pathological fracture: Secondary | ICD-10-CM | POA: Diagnosis not present

## 2014-05-13 DIAGNOSIS — E785 Hyperlipidemia, unspecified: Secondary | ICD-10-CM | POA: Diagnosis not present

## 2014-05-13 DIAGNOSIS — Z7901 Long term (current) use of anticoagulants: Secondary | ICD-10-CM | POA: Diagnosis not present

## 2014-05-13 DIAGNOSIS — Z79899 Other long term (current) drug therapy: Secondary | ICD-10-CM | POA: Diagnosis not present

## 2014-05-13 DIAGNOSIS — D649 Anemia, unspecified: Secondary | ICD-10-CM | POA: Diagnosis not present

## 2014-05-13 DIAGNOSIS — K219 Gastro-esophageal reflux disease without esophagitis: Secondary | ICD-10-CM | POA: Diagnosis not present

## 2014-05-13 DIAGNOSIS — R42 Dizziness and giddiness: Secondary | ICD-10-CM | POA: Diagnosis not present

## 2014-05-16 DIAGNOSIS — H903 Sensorineural hearing loss, bilateral: Secondary | ICD-10-CM | POA: Diagnosis not present

## 2014-05-21 DIAGNOSIS — H903 Sensorineural hearing loss, bilateral: Secondary | ICD-10-CM | POA: Diagnosis not present

## 2014-06-12 DIAGNOSIS — I480 Paroxysmal atrial fibrillation: Secondary | ICD-10-CM | POA: Diagnosis not present

## 2014-06-12 DIAGNOSIS — Z7901 Long term (current) use of anticoagulants: Secondary | ICD-10-CM | POA: Diagnosis not present

## 2014-06-13 DIAGNOSIS — H1011 Acute atopic conjunctivitis, right eye: Secondary | ICD-10-CM | POA: Diagnosis not present

## 2014-06-13 DIAGNOSIS — H04123 Dry eye syndrome of bilateral lacrimal glands: Secondary | ICD-10-CM | POA: Diagnosis not present

## 2014-06-13 DIAGNOSIS — H3531 Nonexudative age-related macular degeneration: Secondary | ICD-10-CM | POA: Diagnosis not present

## 2014-06-27 DIAGNOSIS — Z1212 Encounter for screening for malignant neoplasm of rectum: Secondary | ICD-10-CM | POA: Diagnosis not present

## 2014-07-02 DIAGNOSIS — E663 Overweight: Secondary | ICD-10-CM | POA: Diagnosis not present

## 2014-07-02 DIAGNOSIS — R159 Full incontinence of feces: Secondary | ICD-10-CM | POA: Diagnosis not present

## 2014-07-02 DIAGNOSIS — E785 Hyperlipidemia, unspecified: Secondary | ICD-10-CM | POA: Diagnosis not present

## 2014-07-02 DIAGNOSIS — I119 Hypertensive heart disease without heart failure: Secondary | ICD-10-CM | POA: Diagnosis not present

## 2014-07-02 DIAGNOSIS — Z8673 Personal history of transient ischemic attack (TIA), and cerebral infarction without residual deficits: Secondary | ICD-10-CM | POA: Diagnosis not present

## 2014-07-02 DIAGNOSIS — Z7901 Long term (current) use of anticoagulants: Secondary | ICD-10-CM | POA: Diagnosis not present

## 2014-07-02 DIAGNOSIS — K589 Irritable bowel syndrome without diarrhea: Secondary | ICD-10-CM | POA: Diagnosis not present

## 2014-07-02 DIAGNOSIS — I480 Paroxysmal atrial fibrillation: Secondary | ICD-10-CM | POA: Diagnosis not present

## 2014-07-02 DIAGNOSIS — K219 Gastro-esophageal reflux disease without esophagitis: Secondary | ICD-10-CM | POA: Diagnosis not present

## 2014-07-04 ENCOUNTER — Encounter: Payer: Medicare Other | Admitting: *Deleted

## 2014-07-04 NOTE — Progress Notes (Signed)
She presents today with chief complaint of painful elongated toenails.  Objective: Pulses are strongly palpable bilateral. Nails are thick yellow dystrophic mycotic and painful palpation.  Assessment: Pain in limb secondary to onychomycosis 1 through 5 bilateral.  Plan: Debridement of nails 1 through 5 bilateral covered service secondary to pain.

## 2014-07-04 NOTE — Progress Notes (Signed)
This encounter was created in error - please disregard.

## 2014-07-16 DIAGNOSIS — Z7901 Long term (current) use of anticoagulants: Secondary | ICD-10-CM | POA: Diagnosis not present

## 2014-07-16 DIAGNOSIS — I480 Paroxysmal atrial fibrillation: Secondary | ICD-10-CM | POA: Diagnosis not present

## 2014-07-28 ENCOUNTER — Emergency Department (HOSPITAL_COMMUNITY)
Admission: EM | Admit: 2014-07-28 | Discharge: 2014-07-28 | Disposition: A | Discharge status: Home / Self Care | Payer: Medicare Other | Attending: Emergency Medicine | Admitting: Emergency Medicine

## 2014-07-28 ENCOUNTER — Emergency Department (HOSPITAL_COMMUNITY): Payer: Medicare Other

## 2014-07-28 ENCOUNTER — Encounter (HOSPITAL_COMMUNITY): Payer: Self-pay | Admitting: Emergency Medicine

## 2014-07-28 DIAGNOSIS — I10 Essential (primary) hypertension: Secondary | ICD-10-CM | POA: Diagnosis not present

## 2014-07-28 DIAGNOSIS — Z853 Personal history of malignant neoplasm of breast: Secondary | ICD-10-CM | POA: Insufficient documentation

## 2014-07-28 DIAGNOSIS — Z7901 Long term (current) use of anticoagulants: Secondary | ICD-10-CM | POA: Diagnosis not present

## 2014-07-28 DIAGNOSIS — Z87440 Personal history of urinary (tract) infections: Secondary | ICD-10-CM | POA: Insufficient documentation

## 2014-07-28 DIAGNOSIS — R55 Syncope and collapse: Secondary | ICD-10-CM | POA: Insufficient documentation

## 2014-07-28 DIAGNOSIS — R42 Dizziness and giddiness: Secondary | ICD-10-CM | POA: Diagnosis not present

## 2014-07-28 DIAGNOSIS — Z880 Allergy status to penicillin: Secondary | ICD-10-CM | POA: Diagnosis not present

## 2014-07-28 DIAGNOSIS — Z8701 Personal history of pneumonia (recurrent): Secondary | ICD-10-CM | POA: Insufficient documentation

## 2014-07-28 DIAGNOSIS — Z91040 Latex allergy status: Secondary | ICD-10-CM | POA: Diagnosis not present

## 2014-07-28 DIAGNOSIS — K219 Gastro-esophageal reflux disease without esophagitis: Secondary | ICD-10-CM | POA: Diagnosis not present

## 2014-07-28 DIAGNOSIS — Z8659 Personal history of other mental and behavioral disorders: Secondary | ICD-10-CM | POA: Insufficient documentation

## 2014-07-28 DIAGNOSIS — Z8673 Personal history of transient ischemic attack (TIA), and cerebral infarction without residual deficits: Secondary | ICD-10-CM | POA: Insufficient documentation

## 2014-07-28 DIAGNOSIS — Z792 Long term (current) use of antibiotics: Secondary | ICD-10-CM | POA: Insufficient documentation

## 2014-07-28 DIAGNOSIS — Z79899 Other long term (current) drug therapy: Secondary | ICD-10-CM | POA: Insufficient documentation

## 2014-07-28 DIAGNOSIS — N289 Disorder of kidney and ureter, unspecified: Secondary | ICD-10-CM | POA: Insufficient documentation

## 2014-07-28 DIAGNOSIS — R61 Generalized hyperhidrosis: Secondary | ICD-10-CM | POA: Insufficient documentation

## 2014-07-28 DIAGNOSIS — Z8739 Personal history of other diseases of the musculoskeletal system and connective tissue: Secondary | ICD-10-CM | POA: Diagnosis not present

## 2014-07-28 DIAGNOSIS — J45909 Unspecified asthma, uncomplicated: Secondary | ICD-10-CM | POA: Insufficient documentation

## 2014-07-28 DIAGNOSIS — H9190 Unspecified hearing loss, unspecified ear: Secondary | ICD-10-CM | POA: Insufficient documentation

## 2014-07-28 DIAGNOSIS — R404 Transient alteration of awareness: Secondary | ICD-10-CM | POA: Diagnosis not present

## 2014-07-28 LAB — URINALYSIS, ROUTINE W REFLEX MICROSCOPIC
Bilirubin Urine: NEGATIVE
Glucose, UA: NEGATIVE mg/dL
Ketones, ur: NEGATIVE mg/dL
Leukocytes, UA: NEGATIVE
Nitrite: NEGATIVE
Protein, ur: NEGATIVE mg/dL
Specific Gravity, Urine: 1.014 (ref 1.005–1.030)
Urobilinogen, UA: 0.2 mg/dL (ref 0.0–1.0)
pH: 6.5 (ref 5.0–8.0)

## 2014-07-28 LAB — PROTIME-INR
INR: 2.58 — ABNORMAL HIGH (ref 0.00–1.49)
Prothrombin Time: 27.9 seconds — ABNORMAL HIGH (ref 11.6–15.2)

## 2014-07-28 LAB — COMPREHENSIVE METABOLIC PANEL
ALT: 15 U/L (ref 14–54)
AST: 18 U/L (ref 15–41)
Albumin: 3.6 g/dL (ref 3.5–5.0)
Alkaline Phosphatase: 82 U/L (ref 38–126)
Anion gap: 10 (ref 5–15)
BUN: 30 mg/dL — ABNORMAL HIGH (ref 6–20)
CO2: 25 mmol/L (ref 22–32)
Calcium: 8.9 mg/dL (ref 8.9–10.3)
Chloride: 102 mmol/L (ref 101–111)
Creatinine, Ser: 1.25 mg/dL — ABNORMAL HIGH (ref 0.44–1.00)
GFR calc Af Amer: 46 mL/min — ABNORMAL LOW (ref >60)
GFR calc non Af Amer: 40 mL/min — ABNORMAL LOW (ref >60)
Glucose, Bld: 111 mg/dL — ABNORMAL HIGH (ref 70–99)
Potassium: 3.6 mmol/L (ref 3.5–5.1)
Sodium: 137 mmol/L (ref 135–145)
Total Bilirubin: 0.5 mg/dL (ref 0.3–1.2)
Total Protein: 6.9 g/dL (ref 6.5–8.1)

## 2014-07-28 LAB — CBC WITH DIFFERENTIAL/PLATELET
Basophils Absolute: 0 10*3/uL (ref 0.0–0.1)
Basophils Relative: 0 % (ref 0–1)
Eosinophils Absolute: 0.1 10*3/uL (ref 0.0–0.7)
Eosinophils Relative: 1 % (ref 0–5)
HCT: 30.5 % — ABNORMAL LOW (ref 36.0–46.0)
Hemoglobin: 9.8 g/dL — ABNORMAL LOW (ref 12.0–15.0)
Lymphocytes Relative: 8 % — ABNORMAL LOW (ref 12–46)
Lymphs Abs: 0.8 10*3/uL (ref 0.7–4.0)
MCH: 26.6 pg (ref 26.0–34.0)
MCHC: 32.1 g/dL (ref 30.0–36.0)
MCV: 82.7 fL (ref 78.0–100.0)
Monocytes Absolute: 0.6 10*3/uL (ref 0.1–1.0)
Monocytes Relative: 6 % (ref 3–12)
Neutro Abs: 8.2 10*3/uL — ABNORMAL HIGH (ref 1.7–7.7)
Neutrophils Relative %: 85 % — ABNORMAL HIGH (ref 43–77)
Platelets: 308 10*3/uL (ref 150–400)
RBC: 3.69 MIL/uL — ABNORMAL LOW (ref 3.87–5.11)
RDW: 14.4 % (ref 11.5–15.5)
WBC: 9.6 10*3/uL (ref 4.0–10.5)

## 2014-07-28 LAB — URINE MICROSCOPIC-ADD ON

## 2014-07-28 LAB — TROPONIN I: Troponin I: <0.03 ng/mL (ref <0.031)

## 2014-07-28 MED ORDER — SODIUM CHLORIDE 0.9 % IV BOLUS (SEPSIS)
500.0000 mL | Freq: Once | INTRAVENOUS | Status: AC
  Administered 2014-07-28: 500 mL via INTRAVENOUS

## 2014-07-28 NOTE — ED Notes (Signed)
Patient transported to X-ray 

## 2014-07-28 NOTE — Discharge Instructions (Signed)
Near-Syncope Near-syncope (commonly known as near fainting) is sudden weakness, dizziness, or feeling like you might pass out. During an episode of near-syncope, you may also develop pale skin, have tunnel vision, or feel sick to your stomach (nauseous). Near-syncope may occur when getting up after sitting or while standing for a long time. It is caused by a sudden decrease in blood flow to the brain. This decrease can result from various causes or triggers, most of which are not serious. However, because near-syncope can sometimes be a sign of something serious, a medical evaluation is required. The specific cause is often not determined. HOME CARE INSTRUCTIONS  Monitor your condition for any changes. The following actions may help to alleviate any discomfort you are experiencing:  Have someone stay with you until you feel stable.  Lie down right away and prop your feet up if you start feeling like you might faint. Breathe deeply and steadily. Wait until all the symptoms have passed. Most of these episodes last only a few minutes. You may feel tired for several hours.   Drink enough fluids to keep your urine clear or pale yellow.   If you are taking blood pressure or heart medicine, get up slowly when seated or lying down. Take several minutes to sit and then stand. This can reduce dizziness.  Follow up with your health care provider as directed. SEEK IMMEDIATE MEDICAL CARE IF:   You have a severe headache.   You have unusual pain in the chest, abdomen, or back.   You are bleeding from the mouth or rectum, or you have black or tarry stool.   You have an irregular or very fast heartbeat.   You have repeated fainting or have seizure-like jerking during an episode.   You faint when sitting or lying down.   You have confusion.   You have difficulty walking.   You have severe weakness.   You have vision problems.  MAKE SURE YOU:   Understand these instructions.  Will  watch your condition.  Will get help right away if you are not doing well or get worse. Document Released: 03/15/2005 Document Revised: 03/20/2013 Document Reviewed: 08/18/2012 ExitCare Patient Information 2015 [employer]. This information is not intended to replace advice given to you by your health care provider. Make sure you discuss any questions you have with your health care provider.  

## 2014-07-28 NOTE — ED Provider Notes (Signed)
CSN: [id number]     Arrival date & time 07/28/14  1209 History   First MD Initiated Contact with Patient 07/28/14 1214     Chief Complaint  Patient presents with  . Near Syncope     (Consider location/radiation/quality/duration/timing/severity/associated sxs/prior Treatment) Patient is a 81 y.o. female presenting with near-syncope. The history is provided by the patient and the spouse.  Near Syncope Pertinent negatives include no chest pain, no abdominal pain, no headaches and no shortness of breath.   patient with a near syncopal episode while at church. Was reportedly sitting down and did not respond to someone talking to her. She then stood up and felt lightheaded. Some confusion with that but did not lose her consciousness all the way. Was reportedly diaphoretic. No chest pain. No trouble breathing. Feels somewhat better now. Has chronic right-sided facial droop. No headache. States she has been doing well the last couple days. No dysuria. No abdominal pain. Has a history of A. fib and can feel when she is in an abnormal rhythm. She states she was not an abnormal rhythm at the time.  Past Medical History  Diagnosis Date  . Hypertension   . GERD (gastroesophageal reflux disease)   . Irritable bowel   . Lymphedema     HX OF - IN LEFT ARM--NO NEEDLES OR B/P'S LEFT ARM  . UTI (lower urinary tract infection)     FREQUENT  . Macular degeneration     "BEGINNINGS OF" MACULAR DEGENERATION  . Hearing impaired   . Breast cancer 1990's    "cancer on one side; precancerous tissue on the other" (11/10/2012)  . Atrial fibrillation     CHRONIC COUMADIN  . Pneumonia     "multiple times; not in a long time" (11/10/2012)  . Asthma   . Chronic bronchitis     "multiple times; not in a long time" (11/10/2012  . Sleep apnea     "dx'd w/it; don't wear mask or anything" (11/10/2012)  . Stroke ~ 2005    HX OF TIA-NO RESIDUAL PROBLEM--PARALYSIS RT SIDE FACE /PT'S MOUTH DROOPS-AND LOSS OF HEARING RT EAR  --SINCE EAR SURGERY AS A CHILD   . Arthritis     "fingers, hips, feet, ankles" (11/10/2012)  . Depression   . Recurrent UTI    Past Surgical History  Procedure Laterality Date  . Joint replacement    . Inner ear surgery Right     MULTIPLE EAR SURGERIES,  . Total knee arthroplasty  09/20/2011    LEFT TOTAL KNEE ARTHROPLASTY;  Surgeon: Gearlean Alf, MD;  Location: [facility];  Service: Orthopedics;  Laterality: Left;  . Knee arthroscopy  04/07/2012    Procedure: ARTHROSCOPY KNEE;  Surgeon: Gearlean Alf, MD;  Location: [facility];  Service: Orthopedics;  Laterality: Left;  WITH SYNOVECTOMY  . Total knee arthroplasty  2006  . Breast biopsy Bilateral 1990's  . Mastectomy Bilateral 1990's  . Tonsillectomy    . Mastoidectomy Right 1942   History reviewed. No pertinent family history. History  Substance Use Topics  . Smoking status: Never Smoker   . Smokeless tobacco: Never Used  . Alcohol Use: Yes     Comment: 11/10/2012 "couple times a year"   OB History    No data available     Review of Systems  Constitutional: Positive for diaphoresis. Negative for activity change and appetite change.  Eyes: Negative for pain.  Respiratory: Negative for chest tightness and shortness of breath.   Cardiovascular: Positive  for near-syncope. Negative for chest pain and leg swelling.  Gastrointestinal: Negative for nausea, vomiting, abdominal pain and diarrhea.  Genitourinary: Negative for flank pain.  Musculoskeletal: Negative for back pain and neck stiffness.  Skin: Negative for rash.  Neurological: Positive for light-headedness. Negative for weakness, numbness and headaches.  Psychiatric/Behavioral: Negative for behavioral problems.      Allergies  Ampicillin; Ciprofloxacin; Clindamycin/lincomycin; Latex; and Pradaxa  Home Medications   Prior to Admission medications   Medication Sig Start Date End Date Taking? Authorizing Provider  CRANBERRY PO Take 1 capsule by mouth  3 (three) times daily.    Historical Provider, MD  diphenhydramine-acetaminophen (TYLENOL PM EXTRA STRENGTH) 25-500 MG TABS Take 2 tablets by mouth at bedtime as needed. Sleep/pain    Historical Provider, MD  levofloxacin (LEVAQUIN) 500 MG tablet Take 1 tablet (500 mg total) by mouth daily. 09/15/12   Alfonzo Beers, MD  loperamide (IMODIUM) 2 MG capsule Take 1-4 mg by mouth as needed. diarrhea    Historical Provider, MD  losartan (COZAAR) 100 MG tablet Take 100 mg by mouth daily.    Historical Provider, MD  Meth-Hyo-M Bl-Na Phos-Ph Sal (URELLE PO) Take 1 tablet by mouth 3 (three) times daily as needed (urinary tract).    Historical Provider, MD  methenamine (HIPREX) 1 G tablet Take 1 g by mouth 2 (two) times daily with a meal.    Historical Provider, MD  metoprolol succinate (TOPROL-XL) 25 MG 24 hr tablet Take 1 tablet (25 mg total) by mouth daily. 11/10/12   Leanna Battles, MD  Multiple Vitamin (MULTIVITAMIN WITH MINERALS) TABS Take 1 tablet by mouth daily.    Historical Provider, MD  Multiple Vitamins-Minerals (ICAPS PO) Take 1 capsule by mouth 2 (two) times daily.    Historical Provider, MD  nitrofurantoin (MACRODANTIN) 100 MG capsule Take 100 mg by mouth at bedtime.    Historical Provider, MD  pantoprazole (PROTONIX) 40 MG tablet Take 40 mg by mouth 2 (two) times daily.    Historical Provider, MD  warfarin (COUMADIN) 5 MG tablet Take 5-7.5 mg by mouth daily. Tuesday, Thursday, and Saturday patient takes 1.5 tablets and 1 tablet on all other days 09/23/11   Arlee Muslim, PA-C   BP 136/50 mmHg  Pulse 70  Temp(Src) 99 F (37.2 C) (Oral)  Resp 14  Ht 5\' 4"  (1.626 m)  Wt 166 lb (75.297 kg)  BMI 28.48 kg/m2  SpO2 98% Physical Exam  Constitutional: She is oriented to person, place, and time. She appears well-developed and well-nourished.  HENT:  Head: Normocephalic and atraumatic.  Eyes: Pupils are equal, round, and reactive to light.  Neck: Normal range of motion.  Cardiovascular: Normal  rate, regular rhythm and normal heart sounds.   No murmur heard. Pulmonary/Chest: Effort normal and breath sounds normal. No respiratory distress. She has no wheezes. She has no rales.  Abdominal: Soft. Bowel sounds are normal. She exhibits no distension. There is no tenderness. There is no rebound and no guarding.  Musculoskeletal: Normal range of motion.  Neurological: She is alert and oriented to person, place, and time. No cranial nerve deficit.  Skin: Skin is warm and dry.  Psychiatric: She has a normal mood and affect. Her speech is normal.  Nursing note and vitals reviewed.   ED Course  Procedures (including critical care time) Labs Review Labs Reviewed  COMPREHENSIVE METABOLIC PANEL - Abnormal; Notable for the following:    Glucose, Bld 111 (*)    BUN 30 (*)    Creatinine,  Ser 1.25 (*)    GFR calc non Af Amer 40 (*)    GFR calc Af Amer 46 (*)    All other components within normal limits  CBC WITH DIFFERENTIAL/PLATELET - Abnormal; Notable for the following:    RBC 3.69 (*)    Hemoglobin 9.8 (*)    HCT 30.5 (*)    Neutrophils Relative % 85 (*)    Neutro Abs 8.2 (*)    Lymphocytes Relative 8 (*)    All other components within normal limits  URINALYSIS, ROUTINE W REFLEX MICROSCOPIC - Abnormal; Notable for the following:    Hgb urine dipstick SMALL (*)    All other components within normal limits  PROTIME-INR - Abnormal; Notable for the following:    Prothrombin Time 27.9 (*)    INR 2.58 (*)    All other components within normal limits  URINE MICROSCOPIC-ADD ON - Abnormal; Notable for the following:    Casts HYALINE CASTS (*)    All other components within normal limits  TROPONIN I    Imaging Review Dg Chest 2 View  07/28/2014   CLINICAL DATA:  Dizziness, near syncope  EXAM: CHEST  2 VIEW  COMPARISON:  11/09/2012  FINDINGS: Chronic interstitial markings. No focal consolidation. No pleural effusion or pneumothorax.  Heart is normal in size.  Degenerative changes of the  visualized thoracolumbar spine.  IMPRESSION: No evidence of acute cardiopulmonary disease.   Electronically Signed   By: Julian Hy M.D.   On: 07/28/2014 13:33     EKG Interpretation   Date/Time:  Sunday Jul 28 2014 12:43:35 EDT Ventricular Rate:  58 PR Interval:  187 QRS Duration: 98 QT Interval:  445 QTC Calculation: 437 R Axis:   11 Text Interpretation:  Sinus rhythm Borderline repolarization abnormality  Confirmed by Alvino Chapel  MD, Ovid Curd [id number]) on 07/28/2014 1:09:14 PM      MDM   Final diagnoses:  Near syncope  Renal insufficiency    Patient with near syncope. Happened at church. sHe is much better now. EKG reassuring. Lab work reassuring and patient be discharged home.    Davonna Belling, MD 07/28/14 [phone]

## 2014-07-28 NOTE — ED Notes (Signed)
At church, got up to go to bathroom and felt hot, flushed, and dizzy. Did not lose consciousness. Family assisted to sit down. Denies pain. Arrives to ED alert and oriented.  *Note: right facial paralysis from a surgery (not new)

## 2014-08-07 DIAGNOSIS — H04121 Dry eye syndrome of right lacrimal gland: Secondary | ICD-10-CM | POA: Diagnosis not present

## 2014-08-07 DIAGNOSIS — H02203 Unspecified lagophthalmos right eye, unspecified eyelid: Secondary | ICD-10-CM | POA: Diagnosis not present

## 2014-08-27 DIAGNOSIS — Z7901 Long term (current) use of anticoagulants: Secondary | ICD-10-CM | POA: Diagnosis not present

## 2014-08-27 DIAGNOSIS — I480 Paroxysmal atrial fibrillation: Secondary | ICD-10-CM | POA: Diagnosis not present

## 2014-09-25 DIAGNOSIS — M79673 Pain in unspecified foot: Secondary | ICD-10-CM | POA: Diagnosis not present

## 2014-09-25 DIAGNOSIS — I10 Essential (primary) hypertension: Secondary | ICD-10-CM | POA: Diagnosis not present

## 2014-09-25 DIAGNOSIS — I480 Paroxysmal atrial fibrillation: Secondary | ICD-10-CM | POA: Diagnosis not present

## 2014-09-25 DIAGNOSIS — Z6829 Body mass index (BMI) 29.0-29.9, adult: Secondary | ICD-10-CM | POA: Diagnosis not present

## 2014-09-25 DIAGNOSIS — G4733 Obstructive sleep apnea (adult) (pediatric): Secondary | ICD-10-CM | POA: Diagnosis not present

## 2014-09-25 DIAGNOSIS — M810 Age-related osteoporosis without current pathological fracture: Secondary | ICD-10-CM | POA: Diagnosis not present

## 2014-09-25 DIAGNOSIS — Z7901 Long term (current) use of anticoagulants: Secondary | ICD-10-CM | POA: Diagnosis not present

## 2014-10-01 DIAGNOSIS — R300 Dysuria: Secondary | ICD-10-CM | POA: Diagnosis not present

## 2014-10-09 DIAGNOSIS — H04121 Dry eye syndrome of right lacrimal gland: Secondary | ICD-10-CM | POA: Diagnosis not present

## 2014-10-09 DIAGNOSIS — H02203 Unspecified lagophthalmos right eye, unspecified eyelid: Secondary | ICD-10-CM | POA: Diagnosis not present

## 2014-10-16 DIAGNOSIS — Z961 Presence of intraocular lens: Secondary | ICD-10-CM | POA: Diagnosis not present

## 2014-10-16 DIAGNOSIS — H02053 Trichiasis without entropian right eye, unspecified eyelid: Secondary | ICD-10-CM | POA: Diagnosis not present

## 2014-10-16 DIAGNOSIS — H1859 Other hereditary corneal dystrophies: Secondary | ICD-10-CM | POA: Diagnosis not present

## 2014-10-17 DIAGNOSIS — R194 Change in bowel habit: Secondary | ICD-10-CM | POA: Diagnosis not present

## 2014-10-17 DIAGNOSIS — R8290 Unspecified abnormal findings in urine: Secondary | ICD-10-CM | POA: Diagnosis not present

## 2014-10-17 DIAGNOSIS — R300 Dysuria: Secondary | ICD-10-CM | POA: Diagnosis not present

## 2014-10-17 DIAGNOSIS — N3020 Other chronic cystitis without hematuria: Secondary | ICD-10-CM | POA: Diagnosis not present

## 2014-10-21 DIAGNOSIS — R194 Change in bowel habit: Secondary | ICD-10-CM | POA: Diagnosis not present

## 2014-10-21 DIAGNOSIS — R143 Flatulence: Secondary | ICD-10-CM | POA: Diagnosis not present

## 2014-10-30 DIAGNOSIS — Z7901 Long term (current) use of anticoagulants: Secondary | ICD-10-CM | POA: Diagnosis not present

## 2014-10-31 ENCOUNTER — Encounter: Payer: Self-pay | Admitting: Podiatry

## 2014-10-31 ENCOUNTER — Ambulatory Visit (INDEPENDENT_AMBULATORY_CARE_PROVIDER_SITE_OTHER): Payer: Medicare Other | Admitting: Podiatry

## 2014-10-31 DIAGNOSIS — B351 Tinea unguium: Secondary | ICD-10-CM | POA: Diagnosis not present

## 2014-10-31 DIAGNOSIS — M79676 Pain in unspecified toe(s): Secondary | ICD-10-CM | POA: Diagnosis not present

## 2014-10-31 NOTE — Progress Notes (Signed)
Patient presents to the office today with a chief complaint of painful elongated toenails.  Objective: Pulses are palpable bilateral. Nails are thick yellow dystrophic clinically mycotic and painful palpation.  Assessment: Pain in limb secondary to onychomycosis 1 through 5 bilateral.  Plan: Debridement of nails 1 through 5 bilateral covered service secondary to pain.  

## 2014-11-06 DIAGNOSIS — N390 Urinary tract infection, site not specified: Secondary | ICD-10-CM | POA: Diagnosis not present

## 2014-11-06 DIAGNOSIS — R399 Unspecified symptoms and signs involving the genitourinary system: Secondary | ICD-10-CM | POA: Diagnosis not present

## 2014-11-08 DIAGNOSIS — R399 Unspecified symptoms and signs involving the genitourinary system: Secondary | ICD-10-CM | POA: Diagnosis not present

## 2014-11-08 DIAGNOSIS — N390 Urinary tract infection, site not specified: Secondary | ICD-10-CM | POA: Diagnosis not present

## 2014-11-11 DIAGNOSIS — D2271 Melanocytic nevi of right lower limb, including hip: Secondary | ICD-10-CM | POA: Diagnosis not present

## 2014-11-11 DIAGNOSIS — D225 Melanocytic nevi of trunk: Secondary | ICD-10-CM | POA: Diagnosis not present

## 2014-11-11 DIAGNOSIS — L821 Other seborrheic keratosis: Secondary | ICD-10-CM | POA: Diagnosis not present

## 2014-11-11 DIAGNOSIS — B079 Viral wart, unspecified: Secondary | ICD-10-CM | POA: Diagnosis not present

## 2014-11-11 DIAGNOSIS — Z85828 Personal history of other malignant neoplasm of skin: Secondary | ICD-10-CM | POA: Diagnosis not present

## 2014-11-12 DIAGNOSIS — K591 Functional diarrhea: Secondary | ICD-10-CM | POA: Diagnosis not present

## 2014-11-27 DIAGNOSIS — Z7901 Long term (current) use of anticoagulants: Secondary | ICD-10-CM | POA: Diagnosis not present

## 2014-11-27 DIAGNOSIS — I480 Paroxysmal atrial fibrillation: Secondary | ICD-10-CM | POA: Diagnosis not present

## 2014-12-04 DIAGNOSIS — N3000 Acute cystitis without hematuria: Secondary | ICD-10-CM | POA: Diagnosis not present

## 2014-12-06 DIAGNOSIS — H1859 Other hereditary corneal dystrophies: Secondary | ICD-10-CM | POA: Diagnosis not present

## 2014-12-06 DIAGNOSIS — H02003 Unspecified entropion of right eye, unspecified eyelid: Secondary | ICD-10-CM | POA: Diagnosis not present

## 2014-12-10 DIAGNOSIS — N390 Urinary tract infection, site not specified: Secondary | ICD-10-CM | POA: Diagnosis not present

## 2014-12-10 DIAGNOSIS — R350 Frequency of micturition: Secondary | ICD-10-CM | POA: Diagnosis not present

## 2014-12-10 DIAGNOSIS — N3020 Other chronic cystitis without hematuria: Secondary | ICD-10-CM | POA: Diagnosis not present

## 2014-12-10 DIAGNOSIS — Z09 Encounter for follow-up examination after completed treatment for conditions other than malignant neoplasm: Secondary | ICD-10-CM | POA: Diagnosis not present

## 2014-12-25 DIAGNOSIS — H11821 Conjunctivochalasis, right eye: Secondary | ICD-10-CM | POA: Diagnosis not present

## 2014-12-25 DIAGNOSIS — H02052 Trichiasis without entropian right lower eyelid: Secondary | ICD-10-CM | POA: Diagnosis not present

## 2014-12-25 DIAGNOSIS — H1859 Other hereditary corneal dystrophies: Secondary | ICD-10-CM | POA: Diagnosis not present

## 2014-12-26 DIAGNOSIS — Z7901 Long term (current) use of anticoagulants: Secondary | ICD-10-CM | POA: Diagnosis not present

## 2014-12-26 DIAGNOSIS — I480 Paroxysmal atrial fibrillation: Secondary | ICD-10-CM | POA: Diagnosis not present

## 2014-12-31 DIAGNOSIS — E785 Hyperlipidemia, unspecified: Secondary | ICD-10-CM | POA: Diagnosis not present

## 2014-12-31 DIAGNOSIS — I480 Paroxysmal atrial fibrillation: Secondary | ICD-10-CM | POA: Diagnosis not present

## 2014-12-31 DIAGNOSIS — Z8673 Personal history of transient ischemic attack (TIA), and cerebral infarction without residual deficits: Secondary | ICD-10-CM | POA: Diagnosis not present

## 2014-12-31 DIAGNOSIS — E663 Overweight: Secondary | ICD-10-CM | POA: Diagnosis not present

## 2014-12-31 DIAGNOSIS — Z7901 Long term (current) use of anticoagulants: Secondary | ICD-10-CM | POA: Diagnosis not present

## 2014-12-31 DIAGNOSIS — K219 Gastro-esophageal reflux disease without esophagitis: Secondary | ICD-10-CM | POA: Diagnosis not present

## 2014-12-31 DIAGNOSIS — I119 Hypertensive heart disease without heart failure: Secondary | ICD-10-CM | POA: Diagnosis not present

## 2015-01-15 DIAGNOSIS — Z6829 Body mass index (BMI) 29.0-29.9, adult: Secondary | ICD-10-CM | POA: Diagnosis not present

## 2015-01-15 DIAGNOSIS — Z23 Encounter for immunization: Secondary | ICD-10-CM | POA: Diagnosis not present

## 2015-01-15 DIAGNOSIS — H6120 Impacted cerumen, unspecified ear: Secondary | ICD-10-CM | POA: Diagnosis not present

## 2015-01-16 DIAGNOSIS — R8290 Unspecified abnormal findings in urine: Secondary | ICD-10-CM | POA: Diagnosis not present

## 2015-01-16 DIAGNOSIS — N952 Postmenopausal atrophic vaginitis: Secondary | ICD-10-CM | POA: Diagnosis not present

## 2015-01-16 DIAGNOSIS — R300 Dysuria: Secondary | ICD-10-CM | POA: Diagnosis not present

## 2015-01-16 DIAGNOSIS — N3021 Other chronic cystitis with hematuria: Secondary | ICD-10-CM | POA: Diagnosis not present

## 2015-01-29 DIAGNOSIS — Z7901 Long term (current) use of anticoagulants: Secondary | ICD-10-CM | POA: Diagnosis not present

## 2015-01-29 DIAGNOSIS — I480 Paroxysmal atrial fibrillation: Secondary | ICD-10-CM | POA: Diagnosis not present

## 2015-02-06 ENCOUNTER — Ambulatory Visit: Payer: Medicare Other | Admitting: Podiatry

## 2015-02-18 DIAGNOSIS — R300 Dysuria: Secondary | ICD-10-CM | POA: Diagnosis not present

## 2015-02-18 DIAGNOSIS — R350 Frequency of micturition: Secondary | ICD-10-CM | POA: Diagnosis not present

## 2015-02-18 DIAGNOSIS — N390 Urinary tract infection, site not specified: Secondary | ICD-10-CM | POA: Diagnosis not present

## 2015-02-18 DIAGNOSIS — R8290 Unspecified abnormal findings in urine: Secondary | ICD-10-CM | POA: Diagnosis not present

## 2015-02-24 DIAGNOSIS — Z961 Presence of intraocular lens: Secondary | ICD-10-CM | POA: Diagnosis not present

## 2015-02-24 DIAGNOSIS — H02003 Unspecified entropion of right eye, unspecified eyelid: Secondary | ICD-10-CM | POA: Diagnosis not present

## 2015-02-24 DIAGNOSIS — H02053 Trichiasis without entropian right eye, unspecified eyelid: Secondary | ICD-10-CM | POA: Diagnosis not present

## 2015-02-25 ENCOUNTER — Ambulatory Visit (INDEPENDENT_AMBULATORY_CARE_PROVIDER_SITE_OTHER): Payer: Medicare Other | Admitting: Podiatry

## 2015-02-25 ENCOUNTER — Encounter: Payer: Self-pay | Admitting: Podiatry

## 2015-02-25 DIAGNOSIS — M79676 Pain in unspecified toe(s): Secondary | ICD-10-CM | POA: Diagnosis not present

## 2015-02-25 DIAGNOSIS — Q828 Other specified congenital malformations of skin: Secondary | ICD-10-CM

## 2015-02-25 DIAGNOSIS — B351 Tinea unguium: Secondary | ICD-10-CM | POA: Diagnosis not present

## 2015-02-25 NOTE — Progress Notes (Signed)
She presents today with chief complaint of painful elongated toenails 1 through 5 bilateral. She is also complaining of a painful callus plantar aspect of the right foot.  Objective: Vital signs are stable she's alert 3. Pulses are strongly palpable. Mild venous insufficiency of bilateral lower extremity resulting in hemosiderin deposition anterior medial aspect of the right foot. Her toenails are thick yellow dystrophic with mycotic and a porokeratotic lesion plantar aspect of the second metatarsophalangeal joint right foot does not demonstrate any signs of skin breakdown or infection.  Assessment: Porokeratosis plantar aspect right foot. Pain in limb secondary to onychomycosis 1 through 5 bilateral.  Plan: Discussed etiology pathology concerned versus surgical therapies. Debridement all reactive hyperkeratoses as well as thick yellow dystrophic onychomycotic nails. Follow up with her as needed.  Roselind Messier DPM

## 2015-03-05 DIAGNOSIS — Z7901 Long term (current) use of anticoagulants: Secondary | ICD-10-CM | POA: Diagnosis not present

## 2015-03-05 DIAGNOSIS — I480 Paroxysmal atrial fibrillation: Secondary | ICD-10-CM | POA: Diagnosis not present

## 2015-03-13 DIAGNOSIS — H33021 Retinal detachment with multiple breaks, right eye: Secondary | ICD-10-CM | POA: Diagnosis not present

## 2015-03-13 DIAGNOSIS — Z961 Presence of intraocular lens: Secondary | ICD-10-CM | POA: Diagnosis not present

## 2015-03-13 DIAGNOSIS — H353130 Nonexudative age-related macular degeneration, bilateral, stage unspecified: Secondary | ICD-10-CM | POA: Diagnosis not present

## 2015-03-13 DIAGNOSIS — H04123 Dry eye syndrome of bilateral lacrimal glands: Secondary | ICD-10-CM | POA: Diagnosis not present

## 2015-03-13 DIAGNOSIS — H26491 Other secondary cataract, right eye: Secondary | ICD-10-CM | POA: Diagnosis not present

## 2015-03-18 DIAGNOSIS — N390 Urinary tract infection, site not specified: Secondary | ICD-10-CM | POA: Diagnosis not present

## 2015-04-10 DIAGNOSIS — I480 Paroxysmal atrial fibrillation: Secondary | ICD-10-CM | POA: Diagnosis not present

## 2015-04-10 DIAGNOSIS — Z7901 Long term (current) use of anticoagulants: Secondary | ICD-10-CM | POA: Diagnosis not present

## 2015-04-21 DIAGNOSIS — N3000 Acute cystitis without hematuria: Secondary | ICD-10-CM | POA: Diagnosis not present

## 2015-05-07 DIAGNOSIS — I480 Paroxysmal atrial fibrillation: Secondary | ICD-10-CM | POA: Diagnosis not present

## 2015-05-07 DIAGNOSIS — Z7901 Long term (current) use of anticoagulants: Secondary | ICD-10-CM | POA: Diagnosis not present

## 2015-05-09 DIAGNOSIS — M9905 Segmental and somatic dysfunction of pelvic region: Secondary | ICD-10-CM | POA: Diagnosis not present

## 2015-05-09 DIAGNOSIS — M545 Low back pain: Secondary | ICD-10-CM | POA: Diagnosis not present

## 2015-05-12 DIAGNOSIS — E784 Other hyperlipidemia: Secondary | ICD-10-CM | POA: Diagnosis not present

## 2015-05-12 DIAGNOSIS — N390 Urinary tract infection, site not specified: Secondary | ICD-10-CM | POA: Diagnosis not present

## 2015-05-12 DIAGNOSIS — M810 Age-related osteoporosis without current pathological fracture: Secondary | ICD-10-CM | POA: Diagnosis not present

## 2015-05-12 DIAGNOSIS — N183 Chronic kidney disease, stage 3 (moderate): Secondary | ICD-10-CM | POA: Diagnosis not present

## 2015-05-12 DIAGNOSIS — M9905 Segmental and somatic dysfunction of pelvic region: Secondary | ICD-10-CM | POA: Diagnosis not present

## 2015-05-12 DIAGNOSIS — M545 Low back pain: Secondary | ICD-10-CM | POA: Diagnosis not present

## 2015-05-12 DIAGNOSIS — R8299 Other abnormal findings in urine: Secondary | ICD-10-CM | POA: Diagnosis not present

## 2015-05-14 DIAGNOSIS — M545 Low back pain: Secondary | ICD-10-CM | POA: Diagnosis not present

## 2015-05-14 DIAGNOSIS — M9905 Segmental and somatic dysfunction of pelvic region: Secondary | ICD-10-CM | POA: Diagnosis not present

## 2015-05-19 DIAGNOSIS — M545 Low back pain: Secondary | ICD-10-CM | POA: Diagnosis not present

## 2015-05-19 DIAGNOSIS — Z0000 Encounter for general adult medical examination without abnormal findings: Secondary | ICD-10-CM | POA: Diagnosis not present

## 2015-05-19 DIAGNOSIS — M8588 Other specified disorders of bone density and structure, other site: Secondary | ICD-10-CM | POA: Diagnosis not present

## 2015-05-19 DIAGNOSIS — I480 Paroxysmal atrial fibrillation: Secondary | ICD-10-CM | POA: Diagnosis not present

## 2015-05-19 DIAGNOSIS — I10 Essential (primary) hypertension: Secondary | ICD-10-CM | POA: Diagnosis not present

## 2015-05-19 DIAGNOSIS — M4312 Spondylolisthesis, cervical region: Secondary | ICD-10-CM | POA: Diagnosis not present

## 2015-05-19 DIAGNOSIS — G4733 Obstructive sleep apnea (adult) (pediatric): Secondary | ICD-10-CM | POA: Diagnosis not present

## 2015-05-19 DIAGNOSIS — D6489 Other specified anemias: Secondary | ICD-10-CM | POA: Diagnosis not present

## 2015-05-19 DIAGNOSIS — M47812 Spondylosis without myelopathy or radiculopathy, cervical region: Secondary | ICD-10-CM | POA: Diagnosis not present

## 2015-05-19 DIAGNOSIS — Z6829 Body mass index (BMI) 29.0-29.9, adult: Secondary | ICD-10-CM | POA: Diagnosis not present

## 2015-05-19 DIAGNOSIS — E784 Other hyperlipidemia: Secondary | ICD-10-CM | POA: Diagnosis not present

## 2015-05-19 DIAGNOSIS — N183 Chronic kidney disease, stage 3 (moderate): Secondary | ICD-10-CM | POA: Diagnosis not present

## 2015-05-19 DIAGNOSIS — M4316 Spondylolisthesis, lumbar region: Secondary | ICD-10-CM | POA: Diagnosis not present

## 2015-05-19 DIAGNOSIS — M47816 Spondylosis without myelopathy or radiculopathy, lumbar region: Secondary | ICD-10-CM | POA: Diagnosis not present

## 2015-05-19 DIAGNOSIS — Z7901 Long term (current) use of anticoagulants: Secondary | ICD-10-CM | POA: Diagnosis not present

## 2015-05-19 DIAGNOSIS — Z1389 Encounter for screening for other disorder: Secondary | ICD-10-CM | POA: Diagnosis not present

## 2015-05-19 DIAGNOSIS — R42 Dizziness and giddiness: Secondary | ICD-10-CM | POA: Diagnosis not present

## 2015-05-20 DIAGNOSIS — M545 Low back pain: Secondary | ICD-10-CM | POA: Diagnosis not present

## 2015-05-20 DIAGNOSIS — M9905 Segmental and somatic dysfunction of pelvic region: Secondary | ICD-10-CM | POA: Diagnosis not present

## 2015-05-22 DIAGNOSIS — M545 Low back pain: Secondary | ICD-10-CM | POA: Diagnosis not present

## 2015-05-22 DIAGNOSIS — M9905 Segmental and somatic dysfunction of pelvic region: Secondary | ICD-10-CM | POA: Diagnosis not present

## 2015-05-26 DIAGNOSIS — M9904 Segmental and somatic dysfunction of sacral region: Secondary | ICD-10-CM | POA: Diagnosis not present

## 2015-05-26 DIAGNOSIS — M9903 Segmental and somatic dysfunction of lumbar region: Secondary | ICD-10-CM | POA: Diagnosis not present

## 2015-05-26 DIAGNOSIS — M5136 Other intervertebral disc degeneration, lumbar region: Secondary | ICD-10-CM | POA: Diagnosis not present

## 2015-05-26 DIAGNOSIS — M9905 Segmental and somatic dysfunction of pelvic region: Secondary | ICD-10-CM | POA: Diagnosis not present

## 2015-05-27 ENCOUNTER — Ambulatory Visit (INDEPENDENT_AMBULATORY_CARE_PROVIDER_SITE_OTHER): Payer: Medicare Other | Admitting: Podiatry

## 2015-05-27 ENCOUNTER — Encounter: Payer: Self-pay | Admitting: Podiatry

## 2015-05-27 DIAGNOSIS — Q828 Other specified congenital malformations of skin: Secondary | ICD-10-CM

## 2015-05-27 DIAGNOSIS — B351 Tinea unguium: Secondary | ICD-10-CM

## 2015-05-27 DIAGNOSIS — M79676 Pain in unspecified toe(s): Secondary | ICD-10-CM | POA: Diagnosis not present

## 2015-05-27 NOTE — Progress Notes (Signed)
She presents today with a chief complaint of painful elongated toenails and porokeratosis plantar aspect of the bilateral foot. She would like to have these cut.  Objective: Vital signs are stable she is alert and oriented 3 very pleasant. Her pulses are palpable. No open lesions or wounds. She is a porokeratotic lesion subsecond metatarsophalangeal joints bilateral foot with thick yellow dystrophic onychomycotic nails which are painful on palpation as well as debridement.  Assessment: Limb secondary to onychomycosis and porokeratosis bilateral.  Plan: Debrided nails 1 through 5 bilaterally was a covered service secondary to pain as well as a porokeratotic lesion.

## 2015-05-29 DIAGNOSIS — M5136 Other intervertebral disc degeneration, lumbar region: Secondary | ICD-10-CM | POA: Diagnosis not present

## 2015-05-29 DIAGNOSIS — M9903 Segmental and somatic dysfunction of lumbar region: Secondary | ICD-10-CM | POA: Diagnosis not present

## 2015-05-29 DIAGNOSIS — M9905 Segmental and somatic dysfunction of pelvic region: Secondary | ICD-10-CM | POA: Diagnosis not present

## 2015-05-29 DIAGNOSIS — M9904 Segmental and somatic dysfunction of sacral region: Secondary | ICD-10-CM | POA: Diagnosis not present

## 2015-06-02 DIAGNOSIS — N3000 Acute cystitis without hematuria: Secondary | ICD-10-CM | POA: Diagnosis not present

## 2015-06-03 DIAGNOSIS — M5136 Other intervertebral disc degeneration, lumbar region: Secondary | ICD-10-CM | POA: Diagnosis not present

## 2015-06-03 DIAGNOSIS — M9904 Segmental and somatic dysfunction of sacral region: Secondary | ICD-10-CM | POA: Diagnosis not present

## 2015-06-03 DIAGNOSIS — M9905 Segmental and somatic dysfunction of pelvic region: Secondary | ICD-10-CM | POA: Diagnosis not present

## 2015-06-03 DIAGNOSIS — M9903 Segmental and somatic dysfunction of lumbar region: Secondary | ICD-10-CM | POA: Diagnosis not present

## 2015-06-03 DIAGNOSIS — I480 Paroxysmal atrial fibrillation: Secondary | ICD-10-CM | POA: Diagnosis not present

## 2015-06-03 DIAGNOSIS — Z7901 Long term (current) use of anticoagulants: Secondary | ICD-10-CM | POA: Diagnosis not present

## 2015-06-04 DIAGNOSIS — M542 Cervicalgia: Secondary | ICD-10-CM | POA: Diagnosis not present

## 2015-06-05 DIAGNOSIS — D485 Neoplasm of uncertain behavior of skin: Secondary | ICD-10-CM | POA: Diagnosis not present

## 2015-06-06 DIAGNOSIS — L570 Actinic keratosis: Secondary | ICD-10-CM | POA: Diagnosis not present

## 2015-06-09 DIAGNOSIS — M9903 Segmental and somatic dysfunction of lumbar region: Secondary | ICD-10-CM | POA: Diagnosis not present

## 2015-06-09 DIAGNOSIS — M5136 Other intervertebral disc degeneration, lumbar region: Secondary | ICD-10-CM | POA: Diagnosis not present

## 2015-06-09 DIAGNOSIS — M9905 Segmental and somatic dysfunction of pelvic region: Secondary | ICD-10-CM | POA: Diagnosis not present

## 2015-06-09 DIAGNOSIS — M9904 Segmental and somatic dysfunction of sacral region: Secondary | ICD-10-CM | POA: Diagnosis not present

## 2015-06-13 DIAGNOSIS — M9904 Segmental and somatic dysfunction of sacral region: Secondary | ICD-10-CM | POA: Diagnosis not present

## 2015-06-13 DIAGNOSIS — M5136 Other intervertebral disc degeneration, lumbar region: Secondary | ICD-10-CM | POA: Diagnosis not present

## 2015-06-13 DIAGNOSIS — M9905 Segmental and somatic dysfunction of pelvic region: Secondary | ICD-10-CM | POA: Diagnosis not present

## 2015-06-13 DIAGNOSIS — M9903 Segmental and somatic dysfunction of lumbar region: Secondary | ICD-10-CM | POA: Diagnosis not present

## 2015-06-17 DIAGNOSIS — M9903 Segmental and somatic dysfunction of lumbar region: Secondary | ICD-10-CM | POA: Diagnosis not present

## 2015-06-17 DIAGNOSIS — M5136 Other intervertebral disc degeneration, lumbar region: Secondary | ICD-10-CM | POA: Diagnosis not present

## 2015-06-17 DIAGNOSIS — M9905 Segmental and somatic dysfunction of pelvic region: Secondary | ICD-10-CM | POA: Diagnosis not present

## 2015-06-17 DIAGNOSIS — M9904 Segmental and somatic dysfunction of sacral region: Secondary | ICD-10-CM | POA: Diagnosis not present

## 2015-06-19 DIAGNOSIS — N3281 Overactive bladder: Secondary | ICD-10-CM | POA: Diagnosis not present

## 2015-06-19 DIAGNOSIS — R350 Frequency of micturition: Secondary | ICD-10-CM | POA: Diagnosis not present

## 2015-06-19 DIAGNOSIS — N390 Urinary tract infection, site not specified: Secondary | ICD-10-CM | POA: Diagnosis not present

## 2015-06-20 DIAGNOSIS — M9903 Segmental and somatic dysfunction of lumbar region: Secondary | ICD-10-CM | POA: Diagnosis not present

## 2015-06-20 DIAGNOSIS — M5136 Other intervertebral disc degeneration, lumbar region: Secondary | ICD-10-CM | POA: Diagnosis not present

## 2015-06-20 DIAGNOSIS — M9905 Segmental and somatic dysfunction of pelvic region: Secondary | ICD-10-CM | POA: Diagnosis not present

## 2015-06-20 DIAGNOSIS — M9904 Segmental and somatic dysfunction of sacral region: Secondary | ICD-10-CM | POA: Diagnosis not present

## 2015-06-20 DIAGNOSIS — R399 Unspecified symptoms and signs involving the genitourinary system: Secondary | ICD-10-CM | POA: Diagnosis not present

## 2015-06-24 DIAGNOSIS — Z7901 Long term (current) use of anticoagulants: Secondary | ICD-10-CM | POA: Diagnosis not present

## 2015-06-24 DIAGNOSIS — I480 Paroxysmal atrial fibrillation: Secondary | ICD-10-CM | POA: Diagnosis not present

## 2015-06-30 DIAGNOSIS — R278 Other lack of coordination: Secondary | ICD-10-CM | POA: Diagnosis not present

## 2015-06-30 DIAGNOSIS — N3941 Urge incontinence: Secondary | ICD-10-CM | POA: Diagnosis not present

## 2015-06-30 DIAGNOSIS — M62838 Other muscle spasm: Secondary | ICD-10-CM | POA: Diagnosis not present

## 2015-06-30 DIAGNOSIS — M6281 Muscle weakness (generalized): Secondary | ICD-10-CM | POA: Diagnosis not present

## 2015-07-01 DIAGNOSIS — K219 Gastro-esophageal reflux disease without esophagitis: Secondary | ICD-10-CM | POA: Diagnosis not present

## 2015-07-01 DIAGNOSIS — E663 Overweight: Secondary | ICD-10-CM | POA: Diagnosis not present

## 2015-07-01 DIAGNOSIS — E785 Hyperlipidemia, unspecified: Secondary | ICD-10-CM | POA: Diagnosis not present

## 2015-07-01 DIAGNOSIS — Z7901 Long term (current) use of anticoagulants: Secondary | ICD-10-CM | POA: Diagnosis not present

## 2015-07-01 DIAGNOSIS — Z8673 Personal history of transient ischemic attack (TIA), and cerebral infarction without residual deficits: Secondary | ICD-10-CM | POA: Diagnosis not present

## 2015-07-01 DIAGNOSIS — I119 Hypertensive heart disease without heart failure: Secondary | ICD-10-CM | POA: Diagnosis not present

## 2015-07-01 DIAGNOSIS — I480 Paroxysmal atrial fibrillation: Secondary | ICD-10-CM | POA: Diagnosis not present

## 2015-07-02 DIAGNOSIS — M791 Myalgia: Secondary | ICD-10-CM | POA: Diagnosis not present

## 2015-07-02 DIAGNOSIS — M47812 Spondylosis without myelopathy or radiculopathy, cervical region: Secondary | ICD-10-CM | POA: Diagnosis not present

## 2015-07-02 DIAGNOSIS — M9905 Segmental and somatic dysfunction of pelvic region: Secondary | ICD-10-CM | POA: Diagnosis not present

## 2015-07-02 DIAGNOSIS — M9903 Segmental and somatic dysfunction of lumbar region: Secondary | ICD-10-CM | POA: Diagnosis not present

## 2015-07-02 DIAGNOSIS — M9904 Segmental and somatic dysfunction of sacral region: Secondary | ICD-10-CM | POA: Diagnosis not present

## 2015-07-02 DIAGNOSIS — M542 Cervicalgia: Secondary | ICD-10-CM | POA: Diagnosis not present

## 2015-07-02 DIAGNOSIS — M5136 Other intervertebral disc degeneration, lumbar region: Secondary | ICD-10-CM | POA: Diagnosis not present

## 2015-07-09 DIAGNOSIS — M9905 Segmental and somatic dysfunction of pelvic region: Secondary | ICD-10-CM | POA: Diagnosis not present

## 2015-07-09 DIAGNOSIS — N3000 Acute cystitis without hematuria: Secondary | ICD-10-CM | POA: Diagnosis not present

## 2015-07-09 DIAGNOSIS — M9903 Segmental and somatic dysfunction of lumbar region: Secondary | ICD-10-CM | POA: Diagnosis not present

## 2015-07-09 DIAGNOSIS — M5136 Other intervertebral disc degeneration, lumbar region: Secondary | ICD-10-CM | POA: Diagnosis not present

## 2015-07-09 DIAGNOSIS — M9904 Segmental and somatic dysfunction of sacral region: Secondary | ICD-10-CM | POA: Diagnosis not present

## 2015-07-10 DIAGNOSIS — M62838 Other muscle spasm: Secondary | ICD-10-CM | POA: Diagnosis not present

## 2015-07-10 DIAGNOSIS — R3915 Urgency of urination: Secondary | ICD-10-CM | POA: Diagnosis not present

## 2015-07-10 DIAGNOSIS — N3941 Urge incontinence: Secondary | ICD-10-CM | POA: Diagnosis not present

## 2015-07-10 DIAGNOSIS — R351 Nocturia: Secondary | ICD-10-CM | POA: Diagnosis not present

## 2015-07-10 DIAGNOSIS — R278 Other lack of coordination: Secondary | ICD-10-CM | POA: Diagnosis not present

## 2015-07-10 DIAGNOSIS — M6281 Muscle weakness (generalized): Secondary | ICD-10-CM | POA: Diagnosis not present

## 2015-07-15 DIAGNOSIS — N390 Urinary tract infection, site not specified: Secondary | ICD-10-CM | POA: Diagnosis not present

## 2015-07-15 DIAGNOSIS — R197 Diarrhea, unspecified: Secondary | ICD-10-CM | POA: Diagnosis not present

## 2015-07-15 DIAGNOSIS — R8290 Unspecified abnormal findings in urine: Secondary | ICD-10-CM | POA: Diagnosis not present

## 2015-07-18 DIAGNOSIS — H02032 Senile entropion of right lower eyelid: Secondary | ICD-10-CM | POA: Diagnosis not present

## 2015-07-18 DIAGNOSIS — H10021 Other mucopurulent conjunctivitis, right eye: Secondary | ICD-10-CM | POA: Diagnosis not present

## 2015-07-18 DIAGNOSIS — Z961 Presence of intraocular lens: Secondary | ICD-10-CM | POA: Diagnosis not present

## 2015-07-23 DIAGNOSIS — R278 Other lack of coordination: Secondary | ICD-10-CM | POA: Diagnosis not present

## 2015-07-23 DIAGNOSIS — N3941 Urge incontinence: Secondary | ICD-10-CM | POA: Diagnosis not present

## 2015-07-23 DIAGNOSIS — M9903 Segmental and somatic dysfunction of lumbar region: Secondary | ICD-10-CM | POA: Diagnosis not present

## 2015-07-23 DIAGNOSIS — M9904 Segmental and somatic dysfunction of sacral region: Secondary | ICD-10-CM | POA: Diagnosis not present

## 2015-07-23 DIAGNOSIS — R351 Nocturia: Secondary | ICD-10-CM | POA: Diagnosis not present

## 2015-07-23 DIAGNOSIS — M6281 Muscle weakness (generalized): Secondary | ICD-10-CM | POA: Diagnosis not present

## 2015-07-23 DIAGNOSIS — M5136 Other intervertebral disc degeneration, lumbar region: Secondary | ICD-10-CM | POA: Diagnosis not present

## 2015-07-23 DIAGNOSIS — M9905 Segmental and somatic dysfunction of pelvic region: Secondary | ICD-10-CM | POA: Diagnosis not present

## 2015-07-23 DIAGNOSIS — M62838 Other muscle spasm: Secondary | ICD-10-CM | POA: Diagnosis not present

## 2015-07-24 DIAGNOSIS — Z961 Presence of intraocular lens: Secondary | ICD-10-CM | POA: Diagnosis not present

## 2015-07-24 DIAGNOSIS — L570 Actinic keratosis: Secondary | ICD-10-CM | POA: Diagnosis not present

## 2015-07-24 DIAGNOSIS — H26491 Other secondary cataract, right eye: Secondary | ICD-10-CM | POA: Diagnosis not present

## 2015-07-24 DIAGNOSIS — H353112 Nonexudative age-related macular degeneration, right eye, intermediate dry stage: Secondary | ICD-10-CM | POA: Diagnosis not present

## 2015-07-24 DIAGNOSIS — H04123 Dry eye syndrome of bilateral lacrimal glands: Secondary | ICD-10-CM | POA: Diagnosis not present

## 2015-07-24 DIAGNOSIS — H353190 Nonexudative age-related macular degeneration, unspecified eye, stage unspecified: Secondary | ICD-10-CM | POA: Diagnosis not present

## 2015-07-24 DIAGNOSIS — B309 Viral conjunctivitis, unspecified: Secondary | ICD-10-CM | POA: Diagnosis not present

## 2015-07-24 DIAGNOSIS — H33021 Retinal detachment with multiple breaks, right eye: Secondary | ICD-10-CM | POA: Diagnosis not present

## 2015-07-29 DIAGNOSIS — I480 Paroxysmal atrial fibrillation: Secondary | ICD-10-CM | POA: Diagnosis not present

## 2015-07-29 DIAGNOSIS — Z7901 Long term (current) use of anticoagulants: Secondary | ICD-10-CM | POA: Diagnosis not present

## 2015-08-05 DIAGNOSIS — M62838 Other muscle spasm: Secondary | ICD-10-CM | POA: Diagnosis not present

## 2015-08-05 DIAGNOSIS — R351 Nocturia: Secondary | ICD-10-CM | POA: Diagnosis not present

## 2015-08-05 DIAGNOSIS — R278 Other lack of coordination: Secondary | ICD-10-CM | POA: Diagnosis not present

## 2015-08-05 DIAGNOSIS — N3941 Urge incontinence: Secondary | ICD-10-CM | POA: Diagnosis not present

## 2015-08-05 DIAGNOSIS — M6281 Muscle weakness (generalized): Secondary | ICD-10-CM | POA: Diagnosis not present

## 2015-08-13 DIAGNOSIS — M6281 Muscle weakness (generalized): Secondary | ICD-10-CM | POA: Diagnosis not present

## 2015-08-13 DIAGNOSIS — R159 Full incontinence of feces: Secondary | ICD-10-CM | POA: Diagnosis not present

## 2015-08-13 DIAGNOSIS — R278 Other lack of coordination: Secondary | ICD-10-CM | POA: Diagnosis not present

## 2015-08-13 DIAGNOSIS — N3941 Urge incontinence: Secondary | ICD-10-CM | POA: Diagnosis not present

## 2015-08-13 DIAGNOSIS — R351 Nocturia: Secondary | ICD-10-CM | POA: Diagnosis not present

## 2015-08-13 DIAGNOSIS — M62838 Other muscle spasm: Secondary | ICD-10-CM | POA: Diagnosis not present

## 2015-08-14 DIAGNOSIS — M9904 Segmental and somatic dysfunction of sacral region: Secondary | ICD-10-CM | POA: Diagnosis not present

## 2015-08-14 DIAGNOSIS — M9903 Segmental and somatic dysfunction of lumbar region: Secondary | ICD-10-CM | POA: Diagnosis not present

## 2015-08-14 DIAGNOSIS — M9905 Segmental and somatic dysfunction of pelvic region: Secondary | ICD-10-CM | POA: Diagnosis not present

## 2015-08-14 DIAGNOSIS — M5136 Other intervertebral disc degeneration, lumbar region: Secondary | ICD-10-CM | POA: Diagnosis not present

## 2015-08-19 ENCOUNTER — Ambulatory Visit: Payer: Medicare Other | Admitting: Podiatry

## 2015-08-20 DIAGNOSIS — R399 Unspecified symptoms and signs involving the genitourinary system: Secondary | ICD-10-CM | POA: Diagnosis not present

## 2015-08-22 DIAGNOSIS — R399 Unspecified symptoms and signs involving the genitourinary system: Secondary | ICD-10-CM | POA: Diagnosis not present

## 2015-08-26 DIAGNOSIS — M62838 Other muscle spasm: Secondary | ICD-10-CM | POA: Diagnosis not present

## 2015-08-26 DIAGNOSIS — R278 Other lack of coordination: Secondary | ICD-10-CM | POA: Diagnosis not present

## 2015-08-26 DIAGNOSIS — N3941 Urge incontinence: Secondary | ICD-10-CM | POA: Diagnosis not present

## 2015-08-26 DIAGNOSIS — R339 Retention of urine, unspecified: Secondary | ICD-10-CM | POA: Diagnosis not present

## 2015-08-26 DIAGNOSIS — R351 Nocturia: Secondary | ICD-10-CM | POA: Diagnosis not present

## 2015-08-26 DIAGNOSIS — R159 Full incontinence of feces: Secondary | ICD-10-CM | POA: Diagnosis not present

## 2015-08-26 DIAGNOSIS — N390 Urinary tract infection, site not specified: Secondary | ICD-10-CM | POA: Diagnosis not present

## 2015-08-26 DIAGNOSIS — M6281 Muscle weakness (generalized): Secondary | ICD-10-CM | POA: Diagnosis not present

## 2015-08-27 DIAGNOSIS — N390 Urinary tract infection, site not specified: Secondary | ICD-10-CM | POA: Diagnosis not present

## 2015-08-28 DIAGNOSIS — Z7901 Long term (current) use of anticoagulants: Secondary | ICD-10-CM | POA: Diagnosis not present

## 2015-08-28 DIAGNOSIS — N390 Urinary tract infection, site not specified: Secondary | ICD-10-CM | POA: Diagnosis not present

## 2015-08-28 DIAGNOSIS — I480 Paroxysmal atrial fibrillation: Secondary | ICD-10-CM | POA: Diagnosis not present

## 2015-08-29 DIAGNOSIS — N390 Urinary tract infection, site not specified: Secondary | ICD-10-CM | POA: Diagnosis not present

## 2015-08-29 DIAGNOSIS — K591 Functional diarrhea: Secondary | ICD-10-CM | POA: Diagnosis not present

## 2015-09-09 ENCOUNTER — Ambulatory Visit: Payer: Medicare Other | Admitting: Podiatry

## 2015-09-09 DIAGNOSIS — N3941 Urge incontinence: Secondary | ICD-10-CM | POA: Diagnosis not present

## 2015-09-09 DIAGNOSIS — N3000 Acute cystitis without hematuria: Secondary | ICD-10-CM | POA: Diagnosis not present

## 2015-09-09 DIAGNOSIS — R278 Other lack of coordination: Secondary | ICD-10-CM | POA: Diagnosis not present

## 2015-09-09 DIAGNOSIS — M6281 Muscle weakness (generalized): Secondary | ICD-10-CM | POA: Diagnosis not present

## 2015-09-10 DIAGNOSIS — Z7901 Long term (current) use of anticoagulants: Secondary | ICD-10-CM | POA: Diagnosis not present

## 2015-09-10 DIAGNOSIS — I480 Paroxysmal atrial fibrillation: Secondary | ICD-10-CM | POA: Diagnosis not present

## 2015-09-16 ENCOUNTER — Encounter: Payer: Self-pay | Admitting: Podiatry

## 2015-09-16 ENCOUNTER — Ambulatory Visit (INDEPENDENT_AMBULATORY_CARE_PROVIDER_SITE_OTHER): Payer: Medicare Other | Admitting: Podiatry

## 2015-09-16 DIAGNOSIS — N390 Urinary tract infection, site not specified: Secondary | ICD-10-CM | POA: Diagnosis not present

## 2015-09-16 DIAGNOSIS — R8271 Bacteriuria: Secondary | ICD-10-CM | POA: Diagnosis not present

## 2015-09-16 DIAGNOSIS — B351 Tinea unguium: Secondary | ICD-10-CM | POA: Diagnosis not present

## 2015-09-16 DIAGNOSIS — M79676 Pain in unspecified toe(s): Secondary | ICD-10-CM

## 2015-09-16 DIAGNOSIS — R8290 Unspecified abnormal findings in urine: Secondary | ICD-10-CM | POA: Diagnosis not present

## 2015-09-16 DIAGNOSIS — Q828 Other specified congenital malformations of skin: Secondary | ICD-10-CM

## 2015-09-16 DIAGNOSIS — R339 Retention of urine, unspecified: Secondary | ICD-10-CM | POA: Diagnosis not present

## 2015-09-16 NOTE — Progress Notes (Signed)
She presents today with chief complaint of painful elongated toenails with corns and calluses to the plantar foot. She states that she has going on a [employer] in the near future.  Objective: Vital signs are stable alert and oriented 3. Pulses are palpable. Neurologic sensorium is intact. Deep tendon reflexes are intact. Muscle strength is normal bilateral. Toenails are thick yellow dystrophic [place] mycotic and painful on palpation. Multiple porokeratotic lesions plantar aspect of the forefoot bilateral. No open lesions or wounds are noted.  Assessment: Pain in limb secondary to onychomycosis 1 through 5 bilateral porokeratosis bilateral.  Plan: Debridement of toenails in thickness and length and debridement of porokeratotic lesions.

## 2015-09-17 DIAGNOSIS — M9904 Segmental and somatic dysfunction of sacral region: Secondary | ICD-10-CM | POA: Diagnosis not present

## 2015-09-17 DIAGNOSIS — M9903 Segmental and somatic dysfunction of lumbar region: Secondary | ICD-10-CM | POA: Diagnosis not present

## 2015-09-17 DIAGNOSIS — M9905 Segmental and somatic dysfunction of pelvic region: Secondary | ICD-10-CM | POA: Diagnosis not present

## 2015-09-17 DIAGNOSIS — M5136 Other intervertebral disc degeneration, lumbar region: Secondary | ICD-10-CM | POA: Diagnosis not present

## 2015-09-22 DIAGNOSIS — H1859 Other hereditary corneal dystrophies: Secondary | ICD-10-CM | POA: Diagnosis not present

## 2015-09-22 DIAGNOSIS — Z961 Presence of intraocular lens: Secondary | ICD-10-CM | POA: Diagnosis not present

## 2015-09-25 DIAGNOSIS — H26491 Other secondary cataract, right eye: Secondary | ICD-10-CM | POA: Diagnosis not present

## 2015-10-02 DIAGNOSIS — R197 Diarrhea, unspecified: Secondary | ICD-10-CM | POA: Diagnosis not present

## 2015-10-15 DIAGNOSIS — I480 Paroxysmal atrial fibrillation: Secondary | ICD-10-CM | POA: Diagnosis not present

## 2015-10-15 DIAGNOSIS — Z7901 Long term (current) use of anticoagulants: Secondary | ICD-10-CM | POA: Diagnosis not present

## 2015-10-16 DIAGNOSIS — N390 Urinary tract infection, site not specified: Secondary | ICD-10-CM | POA: Diagnosis not present

## 2015-10-29 DIAGNOSIS — N390 Urinary tract infection, site not specified: Secondary | ICD-10-CM | POA: Diagnosis not present

## 2015-10-30 DIAGNOSIS — H10021 Other mucopurulent conjunctivitis, right eye: Secondary | ICD-10-CM | POA: Diagnosis not present

## 2015-10-30 DIAGNOSIS — H1859 Other hereditary corneal dystrophies: Secondary | ICD-10-CM | POA: Diagnosis not present

## 2015-11-04 ENCOUNTER — Encounter (HOSPITAL_COMMUNITY): Payer: Self-pay | Admitting: Vascular Surgery

## 2015-11-04 ENCOUNTER — Emergency Department (HOSPITAL_COMMUNITY): Payer: Medicare Other

## 2015-11-04 ENCOUNTER — Emergency Department (HOSPITAL_COMMUNITY)
Admission: EM | Admit: 2015-11-04 | Discharge: 2015-11-04 | Disposition: A | Discharge status: Home / Self Care | Payer: Medicare Other | Attending: Emergency Medicine | Admitting: Emergency Medicine

## 2015-11-04 DIAGNOSIS — Z91040 Latex allergy status: Secondary | ICD-10-CM | POA: Diagnosis not present

## 2015-11-04 DIAGNOSIS — N390 Urinary tract infection, site not specified: Secondary | ICD-10-CM

## 2015-11-04 DIAGNOSIS — Z7901 Long term (current) use of anticoagulants: Secondary | ICD-10-CM | POA: Diagnosis not present

## 2015-11-04 DIAGNOSIS — R0600 Dyspnea, unspecified: Secondary | ICD-10-CM

## 2015-11-04 DIAGNOSIS — Z853 Personal history of malignant neoplasm of breast: Secondary | ICD-10-CM | POA: Insufficient documentation

## 2015-11-04 DIAGNOSIS — J449 Chronic obstructive pulmonary disease, unspecified: Secondary | ICD-10-CM | POA: Diagnosis not present

## 2015-11-04 DIAGNOSIS — Z96652 Presence of left artificial knee joint: Secondary | ICD-10-CM | POA: Diagnosis not present

## 2015-11-04 DIAGNOSIS — R0602 Shortness of breath: Secondary | ICD-10-CM | POA: Diagnosis not present

## 2015-11-04 DIAGNOSIS — R791 Abnormal coagulation profile: Secondary | ICD-10-CM | POA: Insufficient documentation

## 2015-11-04 DIAGNOSIS — Z8673 Personal history of transient ischemic attack (TIA), and cerebral infarction without residual deficits: Secondary | ICD-10-CM | POA: Insufficient documentation

## 2015-11-04 DIAGNOSIS — I10 Essential (primary) hypertension: Secondary | ICD-10-CM | POA: Diagnosis not present

## 2015-11-04 LAB — URINALYSIS, ROUTINE W REFLEX MICROSCOPIC
Bilirubin Urine: NEGATIVE
Glucose, UA: NEGATIVE mg/dL
Ketones, ur: NEGATIVE mg/dL
Nitrite: NEGATIVE
Protein, ur: NEGATIVE mg/dL
Specific Gravity, Urine: 1.013 (ref 1.005–1.030)
pH: 5.5 (ref 5.0–8.0)

## 2015-11-04 LAB — I-STAT TROPONIN, ED: Troponin i, poc: 0.02 ng/mL (ref 0.00–0.08)

## 2015-11-04 LAB — PROTIME-INR
INR: 4.12
Prothrombin Time: 40.7 seconds — ABNORMAL HIGH (ref 11.4–15.2)

## 2015-11-04 LAB — CBC
HCT: 30.1 % — ABNORMAL LOW (ref 36.0–46.0)
Hemoglobin: 9.6 g/dL — ABNORMAL LOW (ref 12.0–15.0)
MCH: 27.4 pg (ref 26.0–34.0)
MCHC: 31.9 g/dL (ref 30.0–36.0)
MCV: 85.8 fL (ref 78.0–100.0)
Platelets: 290 10*3/uL (ref 150–400)
RBC: 3.51 MIL/uL — ABNORMAL LOW (ref 3.87–5.11)
RDW: 14.5 % (ref 11.5–15.5)
WBC: 5.2 10*3/uL (ref 4.0–10.5)

## 2015-11-04 LAB — BASIC METABOLIC PANEL
Anion gap: 8 (ref 5–15)
BUN: 26 mg/dL — ABNORMAL HIGH (ref 6–20)
CO2: 25 mmol/L (ref 22–32)
Calcium: 9.3 mg/dL (ref 8.9–10.3)
Chloride: 99 mmol/L — ABNORMAL LOW (ref 101–111)
Creatinine, Ser: 1.22 mg/dL — ABNORMAL HIGH (ref 0.44–1.00)
GFR calc Af Amer: 47 mL/min — ABNORMAL LOW (ref >60)
GFR calc non Af Amer: 41 mL/min — ABNORMAL LOW (ref >60)
Glucose, Bld: 102 mg/dL — ABNORMAL HIGH (ref 65–99)
Potassium: 3.8 mmol/L (ref 3.5–5.1)
Sodium: 132 mmol/L — ABNORMAL LOW (ref 135–145)

## 2015-11-04 LAB — URINE MICROSCOPIC-ADD ON

## 2015-11-04 LAB — BRAIN NATRIURETIC PEPTIDE: B Natriuretic Peptide: 286.1 pg/mL — ABNORMAL HIGH (ref 0.0–100.0)

## 2015-11-04 MED ORDER — DEXTROSE 5 % IV SOLN
1.0000 g | Freq: Once | INTRAVENOUS | Status: AC
  Administered 2015-11-04: 1 g via INTRAVENOUS
  Filled 2015-11-04: qty 10

## 2015-11-04 MED ORDER — IOPAMIDOL (ISOVUE-370) INJECTION 76%
INTRAVENOUS | Status: AC
  Administered 2015-11-04: 100 mL
  Filled 2015-11-04: qty 100

## 2015-11-04 NOTE — ED Notes (Signed)
MD at bedside. 

## 2015-11-04 NOTE — ED Notes (Signed)
Pt gone to CT 

## 2015-11-04 NOTE — ED Triage Notes (Signed)
Pt reports to the ED for eval of SOB and left arm pain that started today. SOB is at rest. She has hx of atrial fibrillation and states she can feel some intermittent "pounding." Pt denies any pain in her chest. Pt is currently on Macrobid for a UTI and went on a cruise to [place] recently. Pt A&Ox4, resp e/u at this time, and skin warm and dry.

## 2015-11-04 NOTE — ED Provider Notes (Signed)
[facility] DEPT Provider Note   CSN: [id number] Arrival date & time: 11/04/15  1601  First Provider Contact:  First MD Initiated Contact with Patient 11/04/15 1815        History   Chief Complaint Chief Complaint  Patient presents with  . Shortness of Breath    HPI [name] is a 82 y.o. female.  HPI Patient since with shortness of breath since Tuesday of last week. She had just flown from [place]. She's complained of fatigue and palpitations as well as left shoulder and upper chest pain. She denies any fever or chills. No cough. She's being treated for a UTI by her primary physician with Macrobid. Denies any urinary symptoms. She's been taking a probiotic to prevent diarrhea. Patient does have a history of atrial fibrillation and states she has been compliant with her Coumadin. Past Medical History:  Diagnosis Date  . Arthritis    "fingers, hips, feet, ankles" (11/10/2012)  . Asthma   . Atrial fibrillation (HCC)    CHRONIC COUMADIN  . Breast cancer ([facility]) 1990's   "cancer on one side; precancerous tissue on the other" (11/10/2012)  . Chronic bronchitis ([facility])    "multiple times; not in a long time" (11/10/2012  . Depression   . GERD (gastroesophageal reflux disease)   . Hearing impaired   . Hypertension   . Irritable bowel   . Lymphedema    HX OF - IN LEFT ARM--NO NEEDLES OR B/P'S LEFT ARM  . Macular degeneration    "BEGINNINGS OF" MACULAR DEGENERATION  . Pneumonia    "multiple times; not in a long time" (11/10/2012)  . Recurrent UTI   . Sleep apnea    "dx'd w/it; don't wear mask or anything" (11/10/2012)  . Stroke ([facility]) ~ 2005   HX OF TIA-NO RESIDUAL PROBLEM--PARALYSIS RT SIDE FACE /PT'S MOUTH DROOPS-AND LOSS OF HEARING RT EAR --SINCE EAR SURGERY AS A CHILD   . UTI (lower urinary tract infection)    FREQUENT    Patient Active Problem List   Diagnosis Date Noted  . Anemia 11/10/2012    Class: Chronic  . Syncope 11/09/2012  . Urinary tract infection, recurrent  11/09/2012  . Hyponatremia 11/09/2012  . Diarrhea 11/09/2012  . Paroxysmal atrial fibrillation ([facility]) 11/09/2012  . Hypertension 11/09/2012  . Synovitis of knee 04/07/2012  . Postop Hypokalemia 09/23/2011  . Postop Acute blood loss anemia 09/21/2011  . Postop Transfusion 09/21/2011  . OA (osteoarthritis) of knee 09/20/2011    Past Surgical History:  Procedure Laterality Date  . BREAST BIOPSY Bilateral 1990's  . INNER EAR SURGERY Right    MULTIPLE EAR SURGERIES,  . JOINT REPLACEMENT    . KNEE ARTHROSCOPY  04/07/2012   Procedure: ARTHROSCOPY KNEE;  Surgeon: Gearlean Alf, MD;  Location: [facility];  Service: Orthopedics;  Laterality: Left;  WITH SYNOVECTOMY  . MASTECTOMY Bilateral 1990's  . MASTOIDECTOMY Right 1942  . TONSILLECTOMY    . TOTAL KNEE ARTHROPLASTY  09/20/2011   LEFT TOTAL KNEE ARTHROPLASTY;  Surgeon: Gearlean Alf, MD;  Location: [facility];  Service: Orthopedics;  Laterality: Left;  . TOTAL KNEE ARTHROPLASTY  2006    OB History    No data available       Home Medications    Prior to Admission medications   Medication Sig Start Date End Date Taking? Authorizing Provider  atorvastatin (LIPITOR) 20 MG tablet Take 20 mg by mouth daily.   Yes Historical Provider, MD  chlorthalidone (HYGROTON) 50 MG tablet  Take 50 mg by mouth every morning.   Yes Historical Provider, MD  CRANBERRY PO Take 1 capsule by mouth 3 (three) times daily.   Yes Historical Provider, MD  diphenhydramine-acetaminophen (TYLENOL PM EXTRA STRENGTH) 25-500 MG TABS Take 2 tablets by mouth at bedtime as needed (sleep/pain).    Yes Historical Provider, MD  loperamide (IMODIUM) 2 MG capsule Take 1-4 mg by mouth daily as needed. diarrhea   Yes Historical Provider, MD  losartan (COZAAR) 100 MG tablet Take 100 mg by mouth daily.   Yes Historical Provider, MD  Meth-Hyo-M Bl-Na Phos-Ph Sal (URELLE PO) Take 1 tablet by mouth 3 (three) times daily as needed (urinary tract).   Yes Historical  Provider, MD  metoprolol succinate (TOPROL-XL) 25 MG 24 hr tablet Take 1 tablet (25 mg total) by mouth daily. Patient taking differently: Take 25 mg by mouth 2 (two) times daily.  11/10/12  Yes Leanna Battles, MD  mirabegron ER (MYRBETRIQ) 25 MG TB24 tablet Take 25 mg by mouth every evening.   Yes Historical Provider, MD  Multiple Vitamin (MULTIVITAMIN WITH MINERALS) TABS Take 1 tablet by mouth daily.   Yes Historical Provider, MD  Multiple Vitamins-Minerals (ICAPS PO) Take 1 capsule by mouth 2 (two) times daily.   Yes Historical Provider, MD  nitrofurantoin, macrocrystal-monohydrate, (MACROBID) 100 MG capsule Take 100 mg by mouth 2 (two) times daily.   Yes Historical Provider, MD  warfarin (COUMADIN) 5 MG tablet Take 5-7.5 mg by mouth daily. Tuesday, Thursday, and Saturday patient takes 1.5 tablets and 1 tablet on all other days 09/23/11  Yes Arlee Muslim, PA-C    Family History No family history on file.  Social History Social History  Substance Use Topics  . Smoking status: Never Smoker  . Smokeless tobacco: Never Used  . Alcohol use Yes     Comment: 11/10/2012 "couple times a year"     Allergies   Ampicillin; Ciprofloxacin; Clindamycin/lincomycin; Latex; and Pradaxa [dabigatran etexilate mesylate]   Review of Systems Review of Systems  Constitutional: Positive for activity change and fatigue. Negative for chills and fever.  Respiratory: Positive for shortness of breath. Negative for cough and chest tightness.   Cardiovascular: Positive for chest pain and palpitations. Negative for leg swelling.  Gastrointestinal: Negative for abdominal pain, constipation, diarrhea, nausea and vomiting.  Genitourinary: Negative for difficulty urinating, dysuria, flank pain, frequency and hematuria.  Musculoskeletal: Negative for back pain, neck pain and neck stiffness.  Skin: Negative for rash and wound.  Neurological: Positive for weakness (generalized). Negative for dizziness, speech  difficulty, light-headedness, numbness and headaches.  All other systems reviewed and are negative.    Physical Exam Updated Vital Signs BP 167/65 (BP Location: Right Arm)   Pulse 72   Temp 99.6 F (37.6 C) (Oral)   Resp 22   SpO2 100%   Physical Exam  Constitutional: She is oriented to person, place, and time. She appears well-developed and well-nourished.  Mildly anxious  HENT:  Head: Normocephalic and atraumatic.  Mouth/Throat: Oropharynx is clear and moist. No oropharyngeal exudate.  Eyes: EOM are normal. Pupils are equal, round, and reactive to light. Right eye exhibits discharge.  Neck: Normal range of motion. Neck supple. No JVD present.  Cardiovascular: Normal rate and regular rhythm.  Exam reveals no gallop and no friction rub.   No murmur heard. Pulmonary/Chest: Effort normal. She has no rales.  Diminished breath sounds bilateral bases.  Abdominal: Soft. Bowel sounds are normal. There is no tenderness. There is no rebound and  no guarding.  Musculoskeletal: Normal range of motion. She exhibits edema. She exhibits no tenderness.  Patient with right greater than left lower extremity swelling and calf tenderness. Distal pulses are 2+ and symmetric.  Neurological: She is alert and oriented to person, place, and time.  Right-sided lower facial droop. Per patient this is chronic from childhood. 5/5 motor in bilateral upper and lower extremities. Sensation is intact.  Skin: Skin is warm and dry. No rash noted. No erythema.  Psychiatric: Her behavior is normal.  Nursing note and vitals reviewed.    ED Treatments / Results  Labs (all labs ordered are listed, but only abnormal results are displayed) Labs Reviewed  BASIC METABOLIC PANEL - Abnormal; Notable for the following:       Result Value   Sodium 132 (*)    Chloride 99 (*)    Glucose, Bld 102 (*)    BUN 26 (*)    Creatinine, Ser 1.22 (*)    GFR calc non Af Amer 41 (*)    GFR calc Af Amer 47 (*)    All other  components within normal limits  CBC - Abnormal; Notable for the following:    RBC 3.51 (*)    Hemoglobin 9.6 (*)    HCT 30.1 (*)    All other components within normal limits  BRAIN NATRIURETIC PEPTIDE - Abnormal; Notable for the following:    B Natriuretic Peptide 286.1 (*)    All other components within normal limits  URINALYSIS, ROUTINE W REFLEX MICROSCOPIC (NOT AT [facility]) - Abnormal; Notable for the following:    APPearance CLOUDY (*)    Hgb urine dipstick SMALL (*)    Leukocytes, UA LARGE (*)    All other components within normal limits  PROTIME-INR - Abnormal; Notable for the following:    Prothrombin Time 40.7 (*)    INR 4.12 (*)    All other components within normal limits  URINE MICROSCOPIC-ADD ON - Abnormal; Notable for the following:    Squamous Epithelial / LPF 0-5 (*)    Bacteria, UA FEW (*)    All other components within normal limits  I-STAT TROPOININ, ED    EKG  EKG Interpretation  Date/Time:  Tuesday November 04 2015 16:05:52 EDT Ventricular Rate:  56 PR Interval:  190 QRS Duration: 80 QT Interval:  406 QTC Calculation: 391 R Axis:   31 Text Interpretation:  Sinus bradycardia Otherwise normal ECG Confirmed by Lita Mains  MD, Jesusita Jocelyn ([id number]) on 11/04/2015 7:29:55 PM       Radiology Dg Chest 2 View  Result Date: 11/04/2015 CLINICAL DATA:  Shortness of breath.  Left arm pain. EXAM: CHEST  2 VIEW COMPARISON:  Multiple x-rays since June of 2013 FINDINGS: The cardiomediastinal silhouette is stable since 2013 with a torturous thoracic aorta and mild prominence of the right hilum. No other interval changes or acute abnormalities. IMPRESSION: No active cardiopulmonary disease. Electronically Signed   By: Dorise Bullion III M.D   On: 11/04/2015 16:59   Ct Angio Chest Pe W Or Wo Contrast  Result Date: 11/04/2015 CLINICAL DATA:  Pt c/o SOB onset today. States " I felt like my heart is beating faster than usual " . H/O A-fib 80 ML ISOVUE 370 IV EXAM: CT ANGIOGRAPHY CHEST WITH  CONTRAST TECHNIQUE: Multidetector CT imaging of the chest was performed using the standard protocol during bolus administration of intravenous contrast. Multiplanar CT image reconstructions and MIPs were obtained to evaluate the vascular anatomy. CONTRAST:  80 cc Isovue 370  COMPARISON:  Chest x-ray 11/04/2015 FINDINGS: Heart: The heart is enlarged. No pericardial effusion. Significant coronary artery calcifications. Mitral annulus calcifications. Vascular structures: The pulmonary arteries are well opacified. There is no acute pulmonary embolus. The thoracic aorta is calcified but not aneurysmal. Mediastinum/thyroid: The visualized portion of the thyroid gland has a normal appearance. No mediastinal, hilar, or axillary adenopathy. Lungs/Airways: Atelectasis is identified at both lung bases. There are focal areas of air trapping within the lungs. No suspicious pulmonary nodules. No pleural effusions or consolidations. Upper abdomen: Low-attenuation lesion is identified within the left hepatic lobe measuring 2.6 cm and consistent with a benign cyst. Small splenic artery aneurysm at the splenic hilum. Low-attenuation is a again identified adjacent to the lower medial aspect of the spleen, previously characterized as a left renal cyst. This is only partially imaged on today's exam. Chest wall/osseous structures: Bilateral breast prostheses. Visualized osseous structures have a normal appearance. Review of the MIP images confirms the above findings. IMPRESSION: 1. Technically adequate exam showing no acute pulmonary embolus. 2. Cardiomegaly. Coronary artery disease and mitral annulus calcification. 3.  Aortic atherosclerosis. 4. Bibasilar atelectasis. 5. Left hepatic cyst. 6. Incompletely imaged probable left renal cyst. Electronically Signed   By: Nolon Nations M.D.   On: 11/04/2015 21:24    Procedures Procedures (including critical care time)  Medications Ordered in ED Medications  cefTRIAXone (ROCEPHIN) 1 g  in dextrose 5 % 50 mL IVPB (1 g Intravenous New Bag/Given 11/04/15 2148)  iopamidol (ISOVUE-370) 76 % injection (100 mLs  Contrast Given 11/04/15 2040)     Initial Impression / Assessment and Plan / ED Course  I have reviewed the triage vital signs and the nursing notes.  Pertinent labs & imaging results that were available during my care of the patient were reviewed by me and considered in my medical decision making (see chart for details).  Clinical Course  Value Comment By Time  ED EKG within 10 minutes (Reviewed) Julianne Rice, MD 08/08 1813  EKG 12-Lead (Reviewed) Julianne Rice, MD 08/08 [phone]    Patient with evidence of persistent UTI despite antibiotics. Hemoglobin and creatinine appear to be at baseline. CT angiogram chest without evidence of PE. Patient does have a mild elevation in her INR. No active bleeding. Will give IV Rocephin Patient is ambulating up and down the hallway without any difficulty. She is in no respiratory distress. Think her respiratory symptoms are likely related to her urinary tract infection. Return precautions given. Final Clinical Impressions(s) / ED Diagnoses   Final diagnoses:  Dyspnea  UTI (lower urinary tract infection)  Elevated INR    New Prescriptions New Prescriptions   No medications on file     Julianne Rice, MD 11/04/15 2222

## 2015-11-04 NOTE — Discharge Instructions (Signed)
Continue antibiotics as prescribed. Follow-up with your urologist. Hold Coumadin for 2 days and follow-up with your primary physician. Return for worsening shortness of breath, fever, weakness or for any concerns.

## 2015-11-06 DIAGNOSIS — Z7901 Long term (current) use of anticoagulants: Secondary | ICD-10-CM | POA: Diagnosis not present

## 2015-11-06 DIAGNOSIS — I480 Paroxysmal atrial fibrillation: Secondary | ICD-10-CM | POA: Diagnosis not present

## 2015-11-07 DIAGNOSIS — H10021 Other mucopurulent conjunctivitis, right eye: Secondary | ICD-10-CM | POA: Diagnosis not present

## 2015-11-07 DIAGNOSIS — H906 Mixed conductive and sensorineural hearing loss, bilateral: Secondary | ICD-10-CM | POA: Diagnosis not present

## 2015-11-13 DIAGNOSIS — N390 Urinary tract infection, site not specified: Secondary | ICD-10-CM | POA: Diagnosis not present

## 2015-11-13 DIAGNOSIS — R350 Frequency of micturition: Secondary | ICD-10-CM | POA: Diagnosis not present

## 2015-11-17 DIAGNOSIS — D2271 Melanocytic nevi of right lower limb, including hip: Secondary | ICD-10-CM | POA: Diagnosis not present

## 2015-11-17 DIAGNOSIS — D1800 Hemangioma unspecified site: Secondary | ICD-10-CM | POA: Diagnosis not present

## 2015-11-17 DIAGNOSIS — Z85828 Personal history of other malignant neoplasm of skin: Secondary | ICD-10-CM | POA: Diagnosis not present

## 2015-11-17 DIAGNOSIS — D225 Melanocytic nevi of trunk: Secondary | ICD-10-CM | POA: Diagnosis not present

## 2015-11-17 DIAGNOSIS — L821 Other seborrheic keratosis: Secondary | ICD-10-CM | POA: Diagnosis not present

## 2015-11-17 DIAGNOSIS — S30861A Insect bite (nonvenomous) of abdominal wall, initial encounter: Secondary | ICD-10-CM | POA: Diagnosis not present

## 2015-11-19 DIAGNOSIS — Z7901 Long term (current) use of anticoagulants: Secondary | ICD-10-CM | POA: Diagnosis not present

## 2015-11-19 DIAGNOSIS — I480 Paroxysmal atrial fibrillation: Secondary | ICD-10-CM | POA: Diagnosis not present

## 2015-11-20 DIAGNOSIS — H04221 Epiphora due to insufficient drainage, right lacrimal gland: Secondary | ICD-10-CM | POA: Diagnosis not present

## 2015-11-20 DIAGNOSIS — H10021 Other mucopurulent conjunctivitis, right eye: Secondary | ICD-10-CM | POA: Diagnosis not present

## 2015-11-20 DIAGNOSIS — H04411 Chronic dacryocystitis of right lacrimal passage: Secondary | ICD-10-CM | POA: Diagnosis not present

## 2015-11-20 DIAGNOSIS — Z961 Presence of intraocular lens: Secondary | ICD-10-CM | POA: Diagnosis not present

## 2015-11-21 DIAGNOSIS — H9042 Sensorineural hearing loss, unilateral, left ear, with unrestricted hearing on the contralateral side: Secondary | ICD-10-CM | POA: Diagnosis not present

## 2015-11-21 DIAGNOSIS — H9071 Mixed conductive and sensorineural hearing loss, unilateral, right ear, with unrestricted hearing on the contralateral side: Secondary | ICD-10-CM | POA: Diagnosis not present

## 2015-11-24 DIAGNOSIS — H1859 Other hereditary corneal dystrophies: Secondary | ICD-10-CM | POA: Diagnosis not present

## 2015-11-24 DIAGNOSIS — Z961 Presence of intraocular lens: Secondary | ICD-10-CM | POA: Diagnosis not present

## 2015-11-24 DIAGNOSIS — H04123 Dry eye syndrome of bilateral lacrimal glands: Secondary | ICD-10-CM | POA: Diagnosis not present

## 2015-12-11 DIAGNOSIS — Z7901 Long term (current) use of anticoagulants: Secondary | ICD-10-CM | POA: Diagnosis not present

## 2015-12-11 DIAGNOSIS — I480 Paroxysmal atrial fibrillation: Secondary | ICD-10-CM | POA: Diagnosis not present

## 2015-12-15 DIAGNOSIS — M9905 Segmental and somatic dysfunction of pelvic region: Secondary | ICD-10-CM | POA: Diagnosis not present

## 2015-12-15 DIAGNOSIS — M5136 Other intervertebral disc degeneration, lumbar region: Secondary | ICD-10-CM | POA: Diagnosis not present

## 2015-12-15 DIAGNOSIS — M9903 Segmental and somatic dysfunction of lumbar region: Secondary | ICD-10-CM | POA: Diagnosis not present

## 2015-12-15 DIAGNOSIS — M9904 Segmental and somatic dysfunction of sacral region: Secondary | ICD-10-CM | POA: Diagnosis not present

## 2015-12-16 ENCOUNTER — Ambulatory Visit (INDEPENDENT_AMBULATORY_CARE_PROVIDER_SITE_OTHER): Payer: Medicare Other | Admitting: Podiatry

## 2015-12-16 ENCOUNTER — Encounter: Payer: Self-pay | Admitting: Podiatry

## 2015-12-16 DIAGNOSIS — M79676 Pain in unspecified toe(s): Secondary | ICD-10-CM

## 2015-12-16 DIAGNOSIS — B351 Tinea unguium: Secondary | ICD-10-CM

## 2015-12-16 DIAGNOSIS — Q828 Other specified congenital malformations of skin: Secondary | ICD-10-CM

## 2015-12-16 DIAGNOSIS — R399 Unspecified symptoms and signs involving the genitourinary system: Secondary | ICD-10-CM | POA: Diagnosis not present

## 2015-12-16 NOTE — Progress Notes (Signed)
She presents today for chief complaint of painful elongated toenails corns and calluses.  Objective: Vital signs are stable she is alert and oriented 3. Toenails are thick yellow dystrophic with mycotic sharply incurvated with pain on palpation to the nails in question. She also has reactive hyperkeratosis distal aspect of the toes and plantar aspect of the forefoot bilateral.  Assessment: Pain limb secondary onychomycosis porokeratosis.  Plan: Debridement of toenails and calluses. Follow up with her as needed.

## 2015-12-17 DIAGNOSIS — Z7901 Long term (current) use of anticoagulants: Secondary | ICD-10-CM | POA: Diagnosis not present

## 2015-12-17 DIAGNOSIS — I480 Paroxysmal atrial fibrillation: Secondary | ICD-10-CM | POA: Diagnosis not present

## 2015-12-17 DIAGNOSIS — Z6828 Body mass index (BMI) 28.0-28.9, adult: Secondary | ICD-10-CM | POA: Diagnosis not present

## 2015-12-17 DIAGNOSIS — R05 Cough: Secondary | ICD-10-CM | POA: Diagnosis not present

## 2015-12-17 DIAGNOSIS — J069 Acute upper respiratory infection, unspecified: Secondary | ICD-10-CM | POA: Diagnosis not present

## 2015-12-22 ENCOUNTER — Encounter (HOSPITAL_COMMUNITY): Payer: Self-pay | Admitting: Emergency Medicine

## 2015-12-22 ENCOUNTER — Emergency Department (HOSPITAL_COMMUNITY): Payer: Medicare Other

## 2015-12-22 ENCOUNTER — Emergency Department (HOSPITAL_COMMUNITY)
Admission: EM | Admit: 2015-12-22 | Discharge: 2015-12-23 | Disposition: A | Discharge status: Home / Self Care | Payer: Medicare Other | Attending: Emergency Medicine | Admitting: Emergency Medicine

## 2015-12-22 DIAGNOSIS — W1830XA Fall on same level, unspecified, initial encounter: Secondary | ICD-10-CM | POA: Diagnosis not present

## 2015-12-22 DIAGNOSIS — I10 Essential (primary) hypertension: Secondary | ICD-10-CM | POA: Diagnosis not present

## 2015-12-22 DIAGNOSIS — J45909 Unspecified asthma, uncomplicated: Secondary | ICD-10-CM | POA: Insufficient documentation

## 2015-12-22 DIAGNOSIS — S63501A Unspecified sprain of right wrist, initial encounter: Secondary | ICD-10-CM | POA: Diagnosis not present

## 2015-12-22 DIAGNOSIS — Z79899 Other long term (current) drug therapy: Secondary | ICD-10-CM | POA: Diagnosis not present

## 2015-12-22 DIAGNOSIS — M25531 Pain in right wrist: Secondary | ICD-10-CM | POA: Diagnosis not present

## 2015-12-22 DIAGNOSIS — Z8673 Personal history of transient ischemic attack (TIA), and cerebral infarction without residual deficits: Secondary | ICD-10-CM | POA: Insufficient documentation

## 2015-12-22 DIAGNOSIS — Y929 Unspecified place or not applicable: Secondary | ICD-10-CM | POA: Insufficient documentation

## 2015-12-22 DIAGNOSIS — Z91040 Latex allergy status: Secondary | ICD-10-CM | POA: Insufficient documentation

## 2015-12-22 DIAGNOSIS — Z96652 Presence of left artificial knee joint: Secondary | ICD-10-CM | POA: Insufficient documentation

## 2015-12-22 DIAGNOSIS — Z7901 Long term (current) use of anticoagulants: Secondary | ICD-10-CM | POA: Insufficient documentation

## 2015-12-22 DIAGNOSIS — Y939 Activity, unspecified: Secondary | ICD-10-CM | POA: Diagnosis not present

## 2015-12-22 DIAGNOSIS — R197 Diarrhea, unspecified: Secondary | ICD-10-CM | POA: Diagnosis not present

## 2015-12-22 DIAGNOSIS — R55 Syncope and collapse: Secondary | ICD-10-CM | POA: Diagnosis not present

## 2015-12-22 DIAGNOSIS — Y999 Unspecified external cause status: Secondary | ICD-10-CM | POA: Insufficient documentation

## 2015-12-22 DIAGNOSIS — Z853 Personal history of malignant neoplasm of breast: Secondary | ICD-10-CM | POA: Insufficient documentation

## 2015-12-22 DIAGNOSIS — S6991XA Unspecified injury of right wrist, hand and finger(s), initial encounter: Secondary | ICD-10-CM | POA: Diagnosis not present

## 2015-12-22 LAB — COMPREHENSIVE METABOLIC PANEL
ALT: 16 U/L (ref 14–54)
AST: 19 U/L (ref 15–41)
Albumin: 3.7 g/dL (ref 3.5–5.0)
Alkaline Phosphatase: 55 U/L (ref 38–126)
Anion gap: 10 (ref 5–15)
BUN: 23 mg/dL — ABNORMAL HIGH (ref 6–20)
CO2: 22 mmol/L (ref 22–32)
Calcium: 9.1 mg/dL (ref 8.9–10.3)
Chloride: 96 mmol/L — ABNORMAL LOW (ref 101–111)
Creatinine, Ser: 1.82 mg/dL — ABNORMAL HIGH (ref 0.44–1.00)
GFR calc Af Amer: 29 mL/min — ABNORMAL LOW (ref >60)
GFR calc non Af Amer: 25 mL/min — ABNORMAL LOW (ref >60)
Glucose, Bld: 146 mg/dL — ABNORMAL HIGH (ref 65–99)
Potassium: 4 mmol/L (ref 3.5–5.1)
Sodium: 128 mmol/L — ABNORMAL LOW (ref 135–145)
Total Bilirubin: 0.4 mg/dL (ref 0.3–1.2)
Total Protein: 6.8 g/dL (ref 6.5–8.1)

## 2015-12-22 LAB — CBC
HCT: 28.9 % — ABNORMAL LOW (ref 36.0–46.0)
Hemoglobin: 9.3 g/dL — ABNORMAL LOW (ref 12.0–15.0)
MCH: 27 pg (ref 26.0–34.0)
MCHC: 32.2 g/dL (ref 30.0–36.0)
MCV: 83.8 fL (ref 78.0–100.0)
Platelets: 300 10*3/uL (ref 150–400)
RBC: 3.45 MIL/uL — ABNORMAL LOW (ref 3.87–5.11)
RDW: 13.7 % (ref 11.5–15.5)
WBC: 7.7 10*3/uL (ref 4.0–10.5)

## 2015-12-22 NOTE — ED Notes (Signed)
Pt states that she forgot to tell staff that she fell today when she had a syncopal episode and is having R wrist pain, positive PMS, no deformity noted, no swelling noted, pt is requesting xray to be done of the wrist. Pt denies any other injuries from fall.

## 2015-12-22 NOTE — ED Triage Notes (Addendum)
Has had a cough  X 2 weeks and some sob and today she got dizzy and passed out and was incontient of stoo,l no cp  But has been so tired, has been on antiobiotics for a uti

## 2015-12-23 DIAGNOSIS — H04221 Epiphora due to insufficient drainage, right lacrimal gland: Secondary | ICD-10-CM | POA: Diagnosis not present

## 2015-12-23 DIAGNOSIS — H04411 Chronic dacryocystitis of right lacrimal passage: Secondary | ICD-10-CM | POA: Diagnosis not present

## 2015-12-23 DIAGNOSIS — H16211 Exposure keratoconjunctivitis, right eye: Secondary | ICD-10-CM | POA: Diagnosis not present

## 2015-12-23 LAB — URINE MICROSCOPIC-ADD ON

## 2015-12-23 LAB — URINALYSIS, ROUTINE W REFLEX MICROSCOPIC
Bilirubin Urine: NEGATIVE
Glucose, UA: NEGATIVE mg/dL
Hgb urine dipstick: NEGATIVE
Ketones, ur: NEGATIVE mg/dL
Nitrite: NEGATIVE
Protein, ur: NEGATIVE mg/dL
Specific Gravity, Urine: 1.009 (ref 1.005–1.030)
pH: 6 (ref 5.0–8.0)

## 2015-12-23 LAB — PROTIME-INR
INR: 1.39
Prothrombin Time: 17.2 seconds — ABNORMAL HIGH (ref 11.4–15.2)

## 2015-12-23 NOTE — ED Provider Notes (Signed)
[facility] DEPT Provider Note   CSN: [id number] Arrival date & time: 12/22/15  1445     History   Chief Complaint Chief Complaint  Patient presents with  . Cough  . Dizziness    HPI [name] is a 82 y.o. female.  Patient with a history of GERD, HTN, UTI, atrial fibrillation, presents after syncopal episode earlier today. She was up doing laundry when she became dizzy, felt like she needed to move her bowels and passed out. When she woke after a brief but unknown specific duration of the episode, she was sweaty and had lost continence of her bowels. No nausea or vomiting. She reports she is currently being treated for a UTI with Septra, now in her 3rd day, and has gotten diarrhea with her antibiotic regimens before. She denies chest pain, palpitations, SOB, headache. She complains of generalized weakness, but denies any weakness that lateralizes.    The history is provided by the patient. No language interpreter was used.  Dizziness  Associated symptoms: weakness   Associated symptoms: no blood in stool, no chest pain, no headaches, no nausea, no palpitations, no shortness of breath and no vomiting     Past Medical History:  Diagnosis Date  . Arthritis    "fingers, hips, feet, ankles" (11/10/2012)  . Asthma   . Atrial fibrillation (HCC)    CHRONIC COUMADIN  . Breast cancer ([place]) 1990's   "cancer on one side; precancerous tissue on the other" (11/10/2012)  . Chronic bronchitis ([place])    "multiple times; not in a long time" (11/10/2012  . Depression   . GERD (gastroesophageal reflux disease)   . Hearing impaired   . Hypertension   . Irritable bowel   . Lymphedema    HX OF - IN LEFT ARM--NO NEEDLES OR B/P'S LEFT ARM  . Macular degeneration    "BEGINNINGS OF" MACULAR DEGENERATION  . Pneumonia    "multiple times; not in a long time" (11/10/2012)  . Recurrent UTI   . Sleep apnea    "dx'd w/it; don't wear mask or anything" (11/10/2012)  . Stroke ([place]) ~ 2005   HX OF  TIA-NO RESIDUAL PROBLEM--PARALYSIS RT SIDE FACE /PT'S MOUTH DROOPS-AND LOSS OF HEARING RT EAR --SINCE EAR SURGERY AS A CHILD   . UTI (lower urinary tract infection)    FREQUENT    Patient Active Problem List   Diagnosis Date Noted  . Anemia 11/10/2012    Class: Chronic  . Syncope 11/09/2012  . Urinary tract infection, recurrent 11/09/2012  . Hyponatremia 11/09/2012  . Diarrhea 11/09/2012  . Paroxysmal atrial fibrillation ([place]) 11/09/2012  . Hypertension 11/09/2012  . Synovitis of knee 04/07/2012  . Postop Hypokalemia 09/23/2011  . Postop Acute blood loss anemia 09/21/2011  . Postop Transfusion 09/21/2011  . OA (osteoarthritis) of knee 09/20/2011    Past Surgical History:  Procedure Laterality Date  . BREAST BIOPSY Bilateral 1990's  . INNER EAR SURGERY Right    MULTIPLE EAR SURGERIES,  . JOINT REPLACEMENT    . KNEE ARTHROSCOPY  04/07/2012   Procedure: ARTHROSCOPY KNEE;  Surgeon: Gearlean Alf, MD;  Location: [facility];  Service: Orthopedics;  Laterality: Left;  WITH SYNOVECTOMY  . MASTECTOMY Bilateral 1990's  . MASTOIDECTOMY Right 1942  . TONSILLECTOMY    . TOTAL KNEE ARTHROPLASTY  09/20/2011   LEFT TOTAL KNEE ARTHROPLASTY;  Surgeon: Gearlean Alf, MD;  Location: [facility];  Service: Orthopedics;  Laterality: Left;  . TOTAL KNEE ARTHROPLASTY  2006  OB History    No data available       Home Medications    Prior to Admission medications   Medication Sig Start Date End Date Taking? Authorizing Provider  atorvastatin (LIPITOR) 20 MG tablet Take 20 mg by mouth daily.    Historical Provider, MD  chlorthalidone (HYGROTON) 50 MG tablet Take 50 mg by mouth every morning.    Historical Provider, MD  CRANBERRY PO Take 1 capsule by mouth 3 (three) times daily.    Historical Provider, MD  diphenhydramine-acetaminophen (TYLENOL PM EXTRA STRENGTH) 25-500 MG TABS Take 2 tablets by mouth at bedtime as needed (sleep/pain).     Historical Provider, MD  loperamide  (IMODIUM) 2 MG capsule Take 1-4 mg by mouth daily as needed. diarrhea    Historical Provider, MD  losartan (COZAAR) 100 MG tablet Take 100 mg by mouth daily.    Historical Provider, MD  Meth-Hyo-M Bl-Na Phos-Ph Sal (URELLE PO) Take 1 tablet by mouth 3 (three) times daily as needed (urinary tract).    Historical Provider, MD  metoprolol succinate (TOPROL-XL) 25 MG 24 hr tablet Take 1 tablet (25 mg total) by mouth daily. Patient taking differently: Take 25 mg by mouth 2 (two) times daily.  11/10/12   Leanna Battles, MD  mirabegron ER (MYRBETRIQ) 25 MG TB24 tablet Take 25 mg by mouth every evening.    Historical Provider, MD  Multiple Vitamin (MULTIVITAMIN WITH MINERALS) TABS Take 1 tablet by mouth daily.    Historical Provider, MD  Multiple Vitamins-Minerals (ICAPS PO) Take 1 capsule by mouth 2 (two) times daily.    Historical Provider, MD  nitrofurantoin, macrocrystal-monohydrate, (MACROBID) 100 MG capsule Take 100 mg by mouth 2 (two) times daily.    Historical Provider, MD  warfarin (COUMADIN) 5 MG tablet Take 5-7.5 mg by mouth daily. Tuesday, Thursday, and Saturday patient takes 1.5 tablets and 1 tablet on all other days 09/23/11   Arlee Muslim, PA-C    Family History No family history on file.  Social History Social History  Substance Use Topics  . Smoking status: Never Smoker  . Smokeless tobacco: Never Used  . Alcohol use Yes     Comment: 11/10/2012 "couple times a year"     Allergies   Ampicillin; Ciprofloxacin; Clindamycin/lincomycin; Latex; and Pradaxa [dabigatran etexilate mesylate]   Review of Systems Review of Systems  Constitutional: Positive for diaphoresis. Negative for chills and fever.  HENT: Negative.   Eyes: Negative.  Negative for visual disturbance.  Respiratory: Negative for shortness of breath.   Cardiovascular: Negative.  Negative for chest pain and palpitations.  Gastrointestinal: Negative for abdominal pain, blood in stool, nausea and vomiting.    Musculoskeletal: Negative.  Negative for myalgias.  Neurological: Positive for dizziness, syncope and weakness. Negative for headaches.     Physical Exam Updated Vital Signs BP 149/56   Pulse 60   Temp 97.8 F (36.6 C) (Oral)   Resp 14   SpO2 99%   Physical Exam  Constitutional: She is oriented to person, place, and time. She appears well-developed and well-nourished.  HENT:  Head: Normocephalic.  Neck: Normal range of motion. Neck supple.  Cardiovascular: Normal rate and regular rhythm.   No murmur heard. Pulmonary/Chest: Effort normal and breath sounds normal.  Abdominal: Soft. Bowel sounds are normal. There is no tenderness. There is no rebound and no guarding.  Musculoskeletal: Normal range of motion. She exhibits no edema.  Right wrist swollen dorsally without bony deformity. FROM with minimal limitation.   Neurological:  She is alert and oriented to person, place, and time.  CN's 3-12 grossly intact. No deficits of coordination. Speech clear and focused. She has chronic facial asymmetry.   Skin: Skin is warm and dry. No rash noted.  Psychiatric: She has a normal mood and affect.     ED Treatments / Results  Labs (all labs ordered are listed, but only abnormal results are displayed) Labs Reviewed  CBC - Abnormal; Notable for the following:       Result Value   RBC 3.45 (*)    Hemoglobin 9.3 (*)    HCT 28.9 (*)    All other components within normal limits  URINALYSIS, ROUTINE W REFLEX MICROSCOPIC (NOT AT [facility]) - Abnormal; Notable for the following:    Leukocytes, UA SMALL (*)    All other components within normal limits  COMPREHENSIVE METABOLIC PANEL - Abnormal; Notable for the following:    Sodium 128 (*)    Chloride 96 (*)    Glucose, Bld 146 (*)    BUN 23 (*)    Creatinine, Ser 1.82 (*)    GFR calc non Af Amer 25 (*)    GFR calc Af Amer 29 (*)    All other components within normal limits  PROTIME-INR - Abnormal; Notable for the following:    Prothrombin  Time 17.2 (*)    All other components within normal limits  URINE MICROSCOPIC-ADD ON - Abnormal; Notable for the following:    Squamous Epithelial / LPF 0-5 (*)    Bacteria, UA RARE (*)    All other components within normal limits    EKG  EKG Interpretation  Date/Time:  Monday December 22 2015 15:26:02 EDT Ventricular Rate:  55 PR Interval:  200 QRS Duration: 80 QT Interval:  452 QTC Calculation: 432 R Axis:   25 Text Interpretation:  Sinus bradycardia Low voltage QRS Cannot rule out Anterior infarct , age undetermined Abnormal ECG When compared with ECG of 11/04/2015, No significant change was found Confirmed by [facility]  MD, DAVID ([id number]) on 12/23/2015 12:00:48 AM       Radiology Dg Wrist Complete Right  Result Date: 12/22/2015 CLINICAL DATA:  82 year old female with fall and right wrist pain. EXAM: RIGHT WRIST - COMPLETE 3+ VIEW COMPARISON:  None. FINDINGS: There is no acute fracture or dislocation. This the bones are osteopenic. The soft tissues are grossly unremarkable with no radiopaque foreign object. IMPRESSION: No acute fracture or dislocation. Electronically Signed   By: Anner Crete M.D.   On: 12/22/2015 23:03    Procedures Procedures (including critical care time)  Medications Ordered in ED Medications - No data to display   Initial Impression / Assessment and Plan / ED Course  I have reviewed the triage vital signs and the nursing notes.  Pertinent labs & imaging results that were available during my care of the patient were reviewed by me and considered in my medical decision making (see chart for details).  Clinical Course    Patient presents after syncopal episode earlier today, associated with a loose bowel movement. No chest pain or palpitations. EKG NSR, bradycardic, no a-fib. Labs are essentially WNL for patient, slightly elevated Cr over baseline. She is feeling better, just generally weak. Right wrist is negative for fracture.  She ambulates without  recurrent dizziness or becoming symptomatic at all. Feel the syncope can be attributed to vaso-vagal episode. She is examined by Dr. Roxanne Mins and found appropriate for discharge home.   Final Clinical Impressions(s) / ED Diagnoses  Final diagnoses:  None    New Prescriptions New Prescriptions   No medications on file     Charlann Lange, Hershal Coria [id number]    Delora Fuel, MD [id number]

## 2015-12-23 NOTE — ED Notes (Signed)
Pt ambulated to restroom with stand by assist. Denies dizziness/ligtheadedness, pain, SOB, etc. Steady gait noted

## 2015-12-25 DIAGNOSIS — K580 Irritable bowel syndrome with diarrhea: Secondary | ICD-10-CM | POA: Diagnosis not present

## 2015-12-25 DIAGNOSIS — N3020 Other chronic cystitis without hematuria: Secondary | ICD-10-CM | POA: Diagnosis not present

## 2015-12-25 DIAGNOSIS — R8290 Unspecified abnormal findings in urine: Secondary | ICD-10-CM | POA: Diagnosis not present

## 2015-12-25 DIAGNOSIS — R300 Dysuria: Secondary | ICD-10-CM | POA: Diagnosis not present

## 2015-12-26 DIAGNOSIS — I480 Paroxysmal atrial fibrillation: Secondary | ICD-10-CM | POA: Diagnosis not present

## 2015-12-26 DIAGNOSIS — Z7901 Long term (current) use of anticoagulants: Secondary | ICD-10-CM | POA: Diagnosis not present

## 2015-12-30 DIAGNOSIS — Z961 Presence of intraocular lens: Secondary | ICD-10-CM | POA: Diagnosis not present

## 2015-12-30 DIAGNOSIS — H353190 Nonexudative age-related macular degeneration, unspecified eye, stage unspecified: Secondary | ICD-10-CM | POA: Diagnosis not present

## 2015-12-30 DIAGNOSIS — H33021 Retinal detachment with multiple breaks, right eye: Secondary | ICD-10-CM | POA: Diagnosis not present

## 2015-12-30 DIAGNOSIS — H1859 Other hereditary corneal dystrophies: Secondary | ICD-10-CM | POA: Diagnosis not present

## 2016-01-05 DIAGNOSIS — I480 Paroxysmal atrial fibrillation: Secondary | ICD-10-CM | POA: Diagnosis not present

## 2016-01-05 DIAGNOSIS — E785 Hyperlipidemia, unspecified: Secondary | ICD-10-CM | POA: Diagnosis not present

## 2016-01-05 DIAGNOSIS — K219 Gastro-esophageal reflux disease without esophagitis: Secondary | ICD-10-CM | POA: Diagnosis not present

## 2016-01-05 DIAGNOSIS — E663 Overweight: Secondary | ICD-10-CM | POA: Diagnosis not present

## 2016-01-05 DIAGNOSIS — Z8673 Personal history of transient ischemic attack (TIA), and cerebral infarction without residual deficits: Secondary | ICD-10-CM | POA: Diagnosis not present

## 2016-01-05 DIAGNOSIS — Z7901 Long term (current) use of anticoagulants: Secondary | ICD-10-CM | POA: Diagnosis not present

## 2016-01-05 DIAGNOSIS — I119 Hypertensive heart disease without heart failure: Secondary | ICD-10-CM | POA: Diagnosis not present

## 2016-01-07 DIAGNOSIS — I480 Paroxysmal atrial fibrillation: Secondary | ICD-10-CM | POA: Diagnosis not present

## 2016-01-07 DIAGNOSIS — Z7901 Long term (current) use of anticoagulants: Secondary | ICD-10-CM | POA: Diagnosis not present

## 2016-01-09 DIAGNOSIS — Z961 Presence of intraocular lens: Secondary | ICD-10-CM | POA: Diagnosis not present

## 2016-01-09 DIAGNOSIS — N3000 Acute cystitis without hematuria: Secondary | ICD-10-CM | POA: Diagnosis not present

## 2016-01-09 DIAGNOSIS — Z880 Allergy status to penicillin: Secondary | ICD-10-CM | POA: Diagnosis not present

## 2016-01-09 DIAGNOSIS — Z888 Allergy status to other drugs, medicaments and biological substances status: Secondary | ICD-10-CM | POA: Diagnosis not present

## 2016-01-09 DIAGNOSIS — H02231 Paralytic lagophthalmos right upper eyelid: Secondary | ICD-10-CM | POA: Diagnosis not present

## 2016-01-09 DIAGNOSIS — H02722 Madarosis of right lower eyelid and periocular area: Secondary | ICD-10-CM | POA: Diagnosis not present

## 2016-01-09 DIAGNOSIS — Z9842 Cataract extraction status, left eye: Secondary | ICD-10-CM | POA: Diagnosis not present

## 2016-01-09 DIAGNOSIS — Z9841 Cataract extraction status, right eye: Secondary | ICD-10-CM | POA: Diagnosis not present

## 2016-01-09 DIAGNOSIS — Z79899 Other long term (current) drug therapy: Secondary | ICD-10-CM | POA: Diagnosis not present

## 2016-01-09 DIAGNOSIS — I10 Essential (primary) hypertension: Secondary | ICD-10-CM | POA: Diagnosis not present

## 2016-01-09 DIAGNOSIS — Z7901 Long term (current) use of anticoagulants: Secondary | ICD-10-CM | POA: Diagnosis not present

## 2016-01-09 DIAGNOSIS — Z881 Allergy status to other antibiotic agents status: Secondary | ICD-10-CM | POA: Diagnosis not present

## 2016-01-09 DIAGNOSIS — H04551 Acquired stenosis of right nasolacrimal duct: Secondary | ICD-10-CM | POA: Diagnosis not present

## 2016-01-09 DIAGNOSIS — I4891 Unspecified atrial fibrillation: Secondary | ICD-10-CM | POA: Diagnosis not present

## 2016-01-09 DIAGNOSIS — H04541 Stenosis of right lacrimal canaliculi: Secondary | ICD-10-CM | POA: Diagnosis not present

## 2016-01-10 DIAGNOSIS — Z23 Encounter for immunization: Secondary | ICD-10-CM | POA: Diagnosis not present

## 2016-01-14 DIAGNOSIS — H04411 Chronic dacryocystitis of right lacrimal passage: Secondary | ICD-10-CM | POA: Diagnosis not present

## 2016-01-14 DIAGNOSIS — H04221 Epiphora due to insufficient drainage, right lacrimal gland: Secondary | ICD-10-CM | POA: Diagnosis not present

## 2016-01-14 DIAGNOSIS — Z961 Presence of intraocular lens: Secondary | ICD-10-CM | POA: Diagnosis not present

## 2016-01-22 DIAGNOSIS — E871 Hypo-osmolality and hyponatremia: Secondary | ICD-10-CM | POA: Diagnosis not present

## 2016-01-22 DIAGNOSIS — Z6827 Body mass index (BMI) 27.0-27.9, adult: Secondary | ICD-10-CM | POA: Diagnosis not present

## 2016-01-22 DIAGNOSIS — R296 Repeated falls: Secondary | ICD-10-CM | POA: Diagnosis not present

## 2016-01-22 DIAGNOSIS — N390 Urinary tract infection, site not specified: Secondary | ICD-10-CM | POA: Diagnosis not present

## 2016-01-22 DIAGNOSIS — I10 Essential (primary) hypertension: Secondary | ICD-10-CM | POA: Diagnosis not present

## 2016-01-22 DIAGNOSIS — K580 Irritable bowel syndrome with diarrhea: Secondary | ICD-10-CM | POA: Diagnosis not present

## 2016-01-22 DIAGNOSIS — R8290 Unspecified abnormal findings in urine: Secondary | ICD-10-CM | POA: Diagnosis not present

## 2016-01-22 DIAGNOSIS — G4733 Obstructive sleep apnea (adult) (pediatric): Secondary | ICD-10-CM | POA: Diagnosis not present

## 2016-01-22 DIAGNOSIS — R413 Other amnesia: Secondary | ICD-10-CM | POA: Diagnosis not present

## 2016-01-22 DIAGNOSIS — I480 Paroxysmal atrial fibrillation: Secondary | ICD-10-CM | POA: Diagnosis not present

## 2016-01-22 DIAGNOSIS — D6489 Other specified anemias: Secondary | ICD-10-CM | POA: Diagnosis not present

## 2016-01-26 ENCOUNTER — Other Ambulatory Visit: Payer: Self-pay | Admitting: Internal Medicine

## 2016-01-26 DIAGNOSIS — R413 Other amnesia: Secondary | ICD-10-CM

## 2016-01-26 DIAGNOSIS — R197 Diarrhea, unspecified: Secondary | ICD-10-CM | POA: Diagnosis not present

## 2016-02-04 DIAGNOSIS — Z7901 Long term (current) use of anticoagulants: Secondary | ICD-10-CM | POA: Diagnosis not present

## 2016-02-04 DIAGNOSIS — I480 Paroxysmal atrial fibrillation: Secondary | ICD-10-CM | POA: Diagnosis not present

## 2016-02-07 ENCOUNTER — Ambulatory Visit
Admission: RE | Admit: 2016-02-07 | Discharge: 2016-02-07 | Disposition: A | Discharge status: Home / Self Care | Payer: Medicare Other | Source: Ambulatory Visit | Attending: Internal Medicine | Admitting: Internal Medicine

## 2016-02-07 DIAGNOSIS — R413 Other amnesia: Secondary | ICD-10-CM

## 2016-02-07 MED ORDER — GADOBENATE DIMEGLUMINE 529 MG/ML IV SOLN
15.0000 mL | Freq: Once | INTRAVENOUS | Status: AC | PRN
  Administered 2016-02-07: 14 mL via INTRAVENOUS

## 2016-02-09 DIAGNOSIS — N3000 Acute cystitis without hematuria: Secondary | ICD-10-CM | POA: Diagnosis not present

## 2016-02-13 DIAGNOSIS — I480 Paroxysmal atrial fibrillation: Secondary | ICD-10-CM | POA: Diagnosis not present

## 2016-02-13 DIAGNOSIS — Z7901 Long term (current) use of anticoagulants: Secondary | ICD-10-CM | POA: Diagnosis not present

## 2016-02-18 DIAGNOSIS — H353112 Nonexudative age-related macular degeneration, right eye, intermediate dry stage: Secondary | ICD-10-CM | POA: Diagnosis not present

## 2016-02-18 DIAGNOSIS — I4891 Unspecified atrial fibrillation: Secondary | ICD-10-CM | POA: Diagnosis not present

## 2016-02-18 DIAGNOSIS — Z7901 Long term (current) use of anticoagulants: Secondary | ICD-10-CM | POA: Diagnosis not present

## 2016-02-18 DIAGNOSIS — K219 Gastro-esophageal reflux disease without esophagitis: Secondary | ICD-10-CM | POA: Diagnosis not present

## 2016-02-18 DIAGNOSIS — Z888 Allergy status to other drugs, medicaments and biological substances status: Secondary | ICD-10-CM | POA: Diagnosis not present

## 2016-02-18 DIAGNOSIS — H04551 Acquired stenosis of right nasolacrimal duct: Secondary | ICD-10-CM | POA: Diagnosis not present

## 2016-02-18 DIAGNOSIS — Z79899 Other long term (current) drug therapy: Secondary | ICD-10-CM | POA: Diagnosis not present

## 2016-02-18 DIAGNOSIS — E785 Hyperlipidemia, unspecified: Secondary | ICD-10-CM | POA: Diagnosis not present

## 2016-02-18 DIAGNOSIS — Z9013 Acquired absence of bilateral breasts and nipples: Secondary | ICD-10-CM | POA: Diagnosis not present

## 2016-02-18 DIAGNOSIS — I69359 Hemiplegia and hemiparesis following cerebral infarction affecting unspecified side: Secondary | ICD-10-CM | POA: Diagnosis not present

## 2016-02-18 DIAGNOSIS — Z91040 Latex allergy status: Secondary | ICD-10-CM | POA: Diagnosis not present

## 2016-02-18 DIAGNOSIS — Z881 Allergy status to other antibiotic agents status: Secondary | ICD-10-CM | POA: Diagnosis not present

## 2016-02-18 DIAGNOSIS — N3281 Overactive bladder: Secondary | ICD-10-CM | POA: Diagnosis not present

## 2016-02-18 DIAGNOSIS — H903 Sensorineural hearing loss, bilateral: Secondary | ICD-10-CM | POA: Diagnosis not present

## 2016-02-18 DIAGNOSIS — K580 Irritable bowel syndrome with diarrhea: Secondary | ICD-10-CM | POA: Diagnosis not present

## 2016-02-18 DIAGNOSIS — Z880 Allergy status to penicillin: Secondary | ICD-10-CM | POA: Diagnosis not present

## 2016-02-18 DIAGNOSIS — M1990 Unspecified osteoarthritis, unspecified site: Secondary | ICD-10-CM | POA: Diagnosis not present

## 2016-02-18 DIAGNOSIS — I10 Essential (primary) hypertension: Secondary | ICD-10-CM | POA: Diagnosis not present

## 2016-02-18 DIAGNOSIS — Z853 Personal history of malignant neoplasm of breast: Secondary | ICD-10-CM | POA: Diagnosis not present

## 2016-02-18 HISTORY — PX: OTHER SURGICAL HISTORY: SHX169

## 2016-02-19 ENCOUNTER — Emergency Department (HOSPITAL_COMMUNITY)
Admission: EM | Admit: 2016-02-19 | Discharge: 2016-02-19 | Payer: Medicare Other | Attending: Emergency Medicine | Admitting: Emergency Medicine

## 2016-02-19 ENCOUNTER — Encounter (HOSPITAL_COMMUNITY): Payer: Self-pay | Admitting: Emergency Medicine

## 2016-02-19 DIAGNOSIS — Z8673 Personal history of transient ischemic attack (TIA), and cerebral infarction without residual deficits: Secondary | ICD-10-CM | POA: Diagnosis not present

## 2016-02-19 DIAGNOSIS — I4891 Unspecified atrial fibrillation: Secondary | ICD-10-CM | POA: Diagnosis not present

## 2016-02-19 DIAGNOSIS — Z7901 Long term (current) use of anticoagulants: Secondary | ICD-10-CM | POA: Insufficient documentation

## 2016-02-19 DIAGNOSIS — R040 Epistaxis: Secondary | ICD-10-CM | POA: Diagnosis not present

## 2016-02-19 DIAGNOSIS — Z96652 Presence of left artificial knee joint: Secondary | ICD-10-CM | POA: Diagnosis not present

## 2016-02-19 DIAGNOSIS — I10 Essential (primary) hypertension: Secondary | ICD-10-CM | POA: Diagnosis not present

## 2016-02-19 DIAGNOSIS — J449 Chronic obstructive pulmonary disease, unspecified: Secondary | ICD-10-CM | POA: Diagnosis not present

## 2016-02-19 DIAGNOSIS — Z853 Personal history of malignant neoplasm of breast: Secondary | ICD-10-CM | POA: Insufficient documentation

## 2016-02-19 DIAGNOSIS — J45909 Unspecified asthma, uncomplicated: Secondary | ICD-10-CM | POA: Insufficient documentation

## 2016-02-19 DIAGNOSIS — Z8249 Family history of ischemic heart disease and other diseases of the circulatory system: Secondary | ICD-10-CM | POA: Diagnosis not present

## 2016-02-19 DIAGNOSIS — Z823 Family history of stroke: Secondary | ICD-10-CM | POA: Diagnosis not present

## 2016-02-19 DIAGNOSIS — Z91040 Latex allergy status: Secondary | ICD-10-CM | POA: Diagnosis not present

## 2016-02-19 DIAGNOSIS — Z880 Allergy status to penicillin: Secondary | ICD-10-CM | POA: Diagnosis not present

## 2016-02-19 LAB — RAPID HIV SCREEN (HIV 1/2 AB+AG)
HIV 1/2 Antibodies: NONREACTIVE
HIV-1 P24 Antigen - HIV24: NONREACTIVE

## 2016-02-19 LAB — PROTIME-INR
INR: 1.18
Prothrombin Time: 15.1 seconds (ref 11.4–15.2)

## 2016-02-19 LAB — BASIC METABOLIC PANEL
Anion gap: 9 (ref 5–15)
BUN: 24 mg/dL — ABNORMAL HIGH (ref 6–20)
CO2: 24 mmol/L (ref 22–32)
Calcium: 9.1 mg/dL (ref 8.9–10.3)
Chloride: 102 mmol/L (ref 101–111)
Creatinine, Ser: 1.75 mg/dL — ABNORMAL HIGH (ref 0.44–1.00)
GFR calc Af Amer: 31 mL/min — ABNORMAL LOW (ref >60)
GFR calc non Af Amer: 26 mL/min — ABNORMAL LOW (ref >60)
Glucose, Bld: 153 mg/dL — ABNORMAL HIGH (ref 65–99)
Potassium: 4 mmol/L (ref 3.5–5.1)
Sodium: 135 mmol/L (ref 135–145)

## 2016-02-19 LAB — CBC WITH DIFFERENTIAL/PLATELET
Basophils Absolute: 0 10*3/uL (ref 0.0–0.1)
Basophils Relative: 0 %
Eosinophils Absolute: 0 10*3/uL (ref 0.0–0.7)
Eosinophils Relative: 0 %
HCT: 28 % — ABNORMAL LOW (ref 36.0–46.0)
Hemoglobin: 9.2 g/dL — ABNORMAL LOW (ref 12.0–15.0)
Lymphocytes Relative: 9 %
Lymphs Abs: 0.7 10*3/uL (ref 0.7–4.0)
MCH: 27.4 pg (ref 26.0–34.0)
MCHC: 32.9 g/dL (ref 30.0–36.0)
MCV: 83.3 fL (ref 78.0–100.0)
Monocytes Absolute: 0.3 10*3/uL (ref 0.1–1.0)
Monocytes Relative: 5 %
Neutro Abs: 6.6 10*3/uL (ref 1.7–7.7)
Neutrophils Relative %: 86 %
Platelets: 277 10*3/uL (ref 150–400)
RBC: 3.36 MIL/uL — ABNORMAL LOW (ref 3.87–5.11)
RDW: 15.1 % (ref 11.5–15.5)
WBC: 7.6 10*3/uL (ref 4.0–10.5)

## 2016-02-19 MED ORDER — HYDROCODONE-ACETAMINOPHEN 5-325 MG PO TABS
1.0000 | ORAL_TABLET | Freq: Once | ORAL | Status: AC
  Administered 2016-02-19: 1 via ORAL
  Filled 2016-02-19: qty 1

## 2016-02-19 NOTE — ED Provider Notes (Signed)
[facility] DEPT Provider Note   CSN: [id number] Arrival date & time: 02/19/16  0140   By signing my name below, I, Delton Prairie, attest that this documentation has been prepared under the direction and in the presence of Ezequiel Essex, MD  Electronically Signed: Delton Prairie, ED Scribe. 02/19/16. 3:13 AM.   History   Chief Complaint Chief Complaint  Patient presents with  . Epistaxis    The history is provided by the patient and the spouse. No language interpreter was used.   HPI Comments:  [name] is a 82 y.o. female, with a hx of A-Fib, UTIs and PSHx of a dacryocystorhinostomy performed on 02/18/16, who presents to the Emergency Department complaining of sudden onset epistaxis which began around 10:20 PM yesterday. Pt's nose, left and right nostril, started bleeding. Pt denies hx of MI, chest pain, SOB, any other associated symptoms and modifying factors at this time.  Pt takes warfarin but stopped 5 days before surgery and switched to shots, last shot was today after surgery.   Past Medical History:  Diagnosis Date  . Arthritis    "fingers, hips, feet, ankles" (11/10/2012)  . Asthma   . Atrial fibrillation (HCC)    CHRONIC COUMADIN  . Breast cancer ([place]) 1990's   "cancer on one side; precancerous tissue on the other" (11/10/2012)  . Chronic bronchitis ([place])    "multiple times; not in a long time" (11/10/2012  . Depression   . GERD (gastroesophageal reflux disease)   . Hearing impaired   . Hypertension   . Irritable bowel   . Lymphedema    HX OF - IN LEFT ARM--NO NEEDLES OR B/P'S LEFT ARM  . Macular degeneration    "BEGINNINGS OF" MACULAR DEGENERATION  . Pneumonia    "multiple times; not in a long time" (11/10/2012)  . Recurrent UTI   . Sleep apnea    "dx'd w/it; don't wear mask or anything" (11/10/2012)  . Stroke ([place]) ~ 2005   HX OF TIA-NO RESIDUAL PROBLEM--PARALYSIS RT SIDE FACE /PT'S MOUTH DROOPS-AND LOSS OF HEARING RT EAR --SINCE EAR SURGERY AS A CHILD    . UTI (lower urinary tract infection)    FREQUENT    Patient Active Problem List   Diagnosis Date Noted  . Anemia 11/10/2012    Class: Chronic  . Syncope 11/09/2012  . Urinary tract infection, recurrent 11/09/2012  . Hyponatremia 11/09/2012  . Diarrhea 11/09/2012  . Paroxysmal atrial fibrillation ([place]) 11/09/2012  . Hypertension 11/09/2012  . Synovitis of knee 04/07/2012  . Postop Hypokalemia 09/23/2011  . Postop Acute blood loss anemia 09/21/2011  . Postop Transfusion 09/21/2011  . OA (osteoarthritis) of knee 09/20/2011    Past Surgical History:  Procedure Laterality Date  . BREAST BIOPSY Bilateral 1990's  . Dacryocystorhinostomy  02/18/2016  . INNER EAR SURGERY Right    MULTIPLE EAR SURGERIES,  . JOINT REPLACEMENT    . KNEE ARTHROSCOPY  04/07/2012   Procedure: ARTHROSCOPY KNEE;  Surgeon: Gearlean Alf, MD;  Location: [facility];  Service: Orthopedics;  Laterality: Left;  WITH SYNOVECTOMY  . MASTECTOMY Bilateral 1990's  . MASTOIDECTOMY Right 1942  . TONSILLECTOMY    . TOTAL KNEE ARTHROPLASTY  09/20/2011   LEFT TOTAL KNEE ARTHROPLASTY;  Surgeon: Gearlean Alf, MD;  Location: [facility];  Service: Orthopedics;  Laterality: Left;  . TOTAL KNEE ARTHROPLASTY  2006    OB History    No data available       Home Medications    Prior  to Admission medications   Medication Sig Start Date End Date Taking? Authorizing Provider  atorvastatin (LIPITOR) 20 MG tablet Take 20 mg by mouth daily.    Historical Provider, MD  chlorthalidone (HYGROTON) 50 MG tablet Take 50 mg by mouth every morning.    Historical Provider, MD  CRANBERRY PO Take 1 capsule by mouth 3 (three) times daily.    Historical Provider, MD  losartan (COZAAR) 100 MG tablet Take 100 mg by mouth daily.    Historical Provider, MD  metoprolol succinate (TOPROL-XL) 25 MG 24 hr tablet Take 1 tablet (25 mg total) by mouth daily. Patient taking differently: Take 25 mg by mouth 2 (two) times daily.  11/10/12    Leanna Battles, MD  mirabegron ER (MYRBETRIQ) 25 MG TB24 tablet Take 25 mg by mouth every evening.    Historical Provider, MD  Multiple Vitamin (MULTIVITAMIN WITH MINERALS) TABS Take 1 tablet by mouth daily.    Historical Provider, MD  Multiple Vitamins-Minerals (ICAPS PO) Take 1 capsule by mouth 2 (two) times daily.    Historical Provider, MD  warfarin (COUMADIN) 5 MG tablet Take 7.5 mg by mouth every Monday, Wednesday, and Friday.  09/23/11   Alexzandrew Monika Salk, PA-C    Family History No family history on file.  Social History Social History  Substance Use Topics  . Smoking status: Never Smoker  . Smokeless tobacco: Never Used  . Alcohol use No     Allergies   Ampicillin; Ciprofloxacin; Clindamycin/lincomycin; Latex; and Pradaxa [dabigatran etexilate mesylate]   Review of Systems Review of Systems  HENT: Positive for nosebleeds and postnasal drip.   Respiratory: Negative for shortness of breath.   Cardiovascular: Negative for chest pain.  Gastrointestinal: Negative for abdominal pain.  10 systems reviewed and all are negative for acute change except as noted in the HPI.  Physical Exam Updated Vital Signs BP (!) 112/53   Pulse 63   Temp 97.7 F (36.5 C) (Oral)   Resp 18   Ht 5\' 5"  (1.651 m)   Wt 155 lb (70.3 kg)   SpO2 99%   BMI 25.79 kg/m   Physical Exam  Constitutional: She is oriented to person, place, and time. She appears well-developed and well-nourished. No distress.  HENT:  Head: Normocephalic and atraumatic.  Mouth/Throat: Oropharynx is clear and moist. No oropharyngeal exudate.  Dry blood to bilateral nares Dry blood in mouth. Large blood clot in posterior pharynx.  Steady bleeding from R nare  Eyes: Conjunctivae and EOM are normal. Pupils are equal, round, and reactive to light.  Surgical dressing to right lower eyelid  Neck: Normal range of motion. Neck supple.  No meningismus.  Cardiovascular: Normal rate, regular rhythm, normal heart sounds  and intact distal pulses.   No murmur heard. Pulmonary/Chest: Effort normal and breath sounds normal. No respiratory distress.  Abdominal: Soft. There is no tenderness. There is no rebound and no guarding.  Musculoskeletal: Normal range of motion. She exhibits no edema or tenderness.  Neurological: She is alert and oriented to person, place, and time. No cranial nerve deficit. She exhibits normal muscle tone. Coordination normal.  Right sided facial droop at baseline.   Skin: Skin is warm.  Psychiatric: She has a normal mood and affect. Her behavior is normal.  Nursing note and vitals reviewed.    ED Treatments / Results  DIAGNOSTIC STUDIES:  Oxygen Saturation is 99% on RA, normal by my interpretation.    COORDINATION OF CARE:  3:22 AM Discussed treatment plan with  pt at bedside and pt agreed to plan. 3:32 AM Removed blood clots from posterior pharynx and right nostril. Will pack to control bleeding.    Labs (all labs ordered are listed, but only abnormal results are displayed) Labs Reviewed  CBC WITH DIFFERENTIAL/PLATELET - Abnormal; Notable for the following:       Result Value   RBC 3.36 (*)    Hemoglobin 9.2 (*)    HCT 28.0 (*)    All other components within normal limits  BASIC METABOLIC PANEL - Abnormal; Notable for the following:    Glucose, Bld 153 (*)    BUN 24 (*)    Creatinine, Ser 1.75 (*)    GFR calc non Af Amer 26 (*)    GFR calc Af Amer 31 (*)    All other components within normal limits  PROTIME-INR  RAPID HIV SCREEN (HIV 1/2 AB+AG)    EKG  EKG Interpretation None       Radiology No results found.  Procedures .Epistaxis Management Date/Time: 02/19/2016 5:21 AM Performed by: Ezequiel Essex Authorized by: Ezequiel Essex   Consent:    Consent obtained:  Verbal   Consent given by:  Patient   Risks discussed:  Pain, nasal injury, infection and bleeding   Alternatives discussed:  No treatment Anesthesia (see MAR for exact dosages):     Anesthesia method:  Topical application   Topical anesthetic:  Lidocaine gel and epinephrine Procedure details:    Treatment site:  R posterior   Treatment method:  Thrombin and nasal tampon   Treatment complexity:  Extensive   Treatment episode: recurring   Post-procedure details:    Assessment:  Bleeding decreased   Patient tolerance of procedure:  Tolerated well, no immediate complications Comments:     Patient aware that packing may cause her recent lacrimal stent surgery to fail 7.5 cm Rhinorocket placed.    (including critical care time)  Medications Ordered in ED Medications - No data to display   Initial Impression / Assessment and Plan / ED Course  I have reviewed the triage vital signs and the nursing notes.  Pertinent labs & imaging results that were available during my care of the patient were reviewed by me and considered in my medical decision making (see chart for details).  Clinical Course    Patient with bleeding from nostril after ophthalmology procedure today. Normally on Coumadin but has been taking Lovenox. Has been having bleeding from her right nose and coughing up blood clots. Denies chest pain, her of breath, dizziness or weakness.  D/w on call ophtho resident Dr. Henrietta Hoover. He states packing may cause her surgery to fail but can be done if necessary. Hemoglobin stable.  INR 1.1 BP and HR stable.  Patient continues to bleed despite topical therapies.  Will require packing. D/w patient who agrees. Rhinorocket placed.  Bleeding has improved.  No further blood in oropharynx.  D/w [facility] ophtho Dr. Henrietta Hoover and Dr. Lucianne Lei, also with ENT Dr. Pablo Ledger.  They will evaluate patient for post op bleeding at [facility] ED.  She is stable for transfer and bleeding is controlled.  CRITICAL CARE Performed by: Ezequiel Essex Total critical care time: 35 minutes Critical care time was exclusive of separately billable procedures and treating other patients. Critical  care was necessary to treat or prevent imminent or life-threatening deterioration. Critical care was time spent personally by me on the following activities: development of treatment plan with patient and/or surrogate as well as nursing, discussions with consultants, evaluation of  patient's response to treatment, examination of patient, obtaining history from patient or surrogate, ordering and performing treatments and interventions, ordering and review of laboratory studies, ordering and review of radiographic studies, pulse oximetry and re-evaluation of patient's condition.    Final Clinical Impressions(s) / ED Diagnoses   Final diagnoses:  Right-sided epistaxis    New Prescriptions New Prescriptions   No medications on file  I personally performed the services described in this documentation, which was scribed in my presence. The recorded information has been reviewed and is accurate.     Ezequiel Essex, MD 02/19/16 [phone]

## 2016-02-19 NOTE — ED Notes (Signed)
Report called and given to charge nurse at [facility] emergency department

## 2016-02-19 NOTE — ED Notes (Signed)
CALLED [facility] TRANSFER LINE TO [facility] @[phone] 

## 2016-02-19 NOTE — ED Notes (Signed)
EDP at bedside  

## 2016-02-19 NOTE — ED Triage Notes (Signed)
Pt presents to the ED with epitaxis since 2300. Pt had a Dacryocystorhinostomy AM of 02/18/2016. Pt is on lovenox to prepare for surgery.

## 2016-02-19 NOTE — ED Notes (Signed)
[facility] on call doctor for Dr Toy Cookey which is Dr Michaelene Song

## 2016-02-25 DIAGNOSIS — G4733 Obstructive sleep apnea (adult) (pediatric): Secondary | ICD-10-CM | POA: Diagnosis not present

## 2016-02-25 DIAGNOSIS — I10 Essential (primary) hypertension: Secondary | ICD-10-CM | POA: Diagnosis not present

## 2016-02-25 DIAGNOSIS — F329 Major depressive disorder, single episode, unspecified: Secondary | ICD-10-CM | POA: Diagnosis not present

## 2016-02-25 DIAGNOSIS — R040 Epistaxis: Secondary | ICD-10-CM | POA: Diagnosis not present

## 2016-02-25 DIAGNOSIS — R413 Other amnesia: Secondary | ICD-10-CM | POA: Diagnosis not present

## 2016-02-25 DIAGNOSIS — I480 Paroxysmal atrial fibrillation: Secondary | ICD-10-CM | POA: Diagnosis not present

## 2016-02-25 DIAGNOSIS — Z6835 Body mass index (BMI) 35.0-35.9, adult: Secondary | ICD-10-CM | POA: Diagnosis not present

## 2016-02-25 DIAGNOSIS — Z7901 Long term (current) use of anticoagulants: Secondary | ICD-10-CM | POA: Diagnosis not present

## 2016-02-25 DIAGNOSIS — D6489 Other specified anemias: Secondary | ICD-10-CM | POA: Diagnosis not present

## 2016-03-09 DIAGNOSIS — M9903 Segmental and somatic dysfunction of lumbar region: Secondary | ICD-10-CM | POA: Diagnosis not present

## 2016-03-09 DIAGNOSIS — M5136 Other intervertebral disc degeneration, lumbar region: Secondary | ICD-10-CM | POA: Diagnosis not present

## 2016-03-09 DIAGNOSIS — M9904 Segmental and somatic dysfunction of sacral region: Secondary | ICD-10-CM | POA: Diagnosis not present

## 2016-03-09 DIAGNOSIS — M9905 Segmental and somatic dysfunction of pelvic region: Secondary | ICD-10-CM | POA: Diagnosis not present

## 2016-03-10 DIAGNOSIS — H04551 Acquired stenosis of right nasolacrimal duct: Secondary | ICD-10-CM | POA: Diagnosis not present

## 2016-03-10 DIAGNOSIS — Z881 Allergy status to other antibiotic agents status: Secondary | ICD-10-CM | POA: Diagnosis not present

## 2016-03-10 DIAGNOSIS — Z9889 Other specified postprocedural states: Secondary | ICD-10-CM | POA: Diagnosis not present

## 2016-03-10 DIAGNOSIS — H02231 Paralytic lagophthalmos right upper eyelid: Secondary | ICD-10-CM | POA: Diagnosis not present

## 2016-03-10 DIAGNOSIS — Z7901 Long term (current) use of anticoagulants: Secondary | ICD-10-CM | POA: Diagnosis not present

## 2016-03-10 DIAGNOSIS — I4891 Unspecified atrial fibrillation: Secondary | ICD-10-CM | POA: Diagnosis not present

## 2016-03-10 DIAGNOSIS — Z853 Personal history of malignant neoplasm of breast: Secondary | ICD-10-CM | POA: Diagnosis not present

## 2016-03-10 DIAGNOSIS — I480 Paroxysmal atrial fibrillation: Secondary | ICD-10-CM | POA: Diagnosis not present

## 2016-03-10 DIAGNOSIS — I10 Essential (primary) hypertension: Secondary | ICD-10-CM | POA: Diagnosis not present

## 2016-03-10 DIAGNOSIS — Z880 Allergy status to penicillin: Secondary | ICD-10-CM | POA: Diagnosis not present

## 2016-03-15 DIAGNOSIS — M9905 Segmental and somatic dysfunction of pelvic region: Secondary | ICD-10-CM | POA: Diagnosis not present

## 2016-03-15 DIAGNOSIS — M5136 Other intervertebral disc degeneration, lumbar region: Secondary | ICD-10-CM | POA: Diagnosis not present

## 2016-03-15 DIAGNOSIS — M9903 Segmental and somatic dysfunction of lumbar region: Secondary | ICD-10-CM | POA: Diagnosis not present

## 2016-03-15 DIAGNOSIS — M9904 Segmental and somatic dysfunction of sacral region: Secondary | ICD-10-CM | POA: Diagnosis not present

## 2016-03-16 ENCOUNTER — Ambulatory Visit: Payer: Medicare Other | Admitting: Podiatry

## 2016-04-01 ENCOUNTER — Ambulatory Visit: Payer: Medicare Other | Admitting: Podiatry

## 2016-04-07 DIAGNOSIS — H1859 Other hereditary corneal dystrophies: Secondary | ICD-10-CM | POA: Diagnosis not present

## 2016-04-07 DIAGNOSIS — I480 Paroxysmal atrial fibrillation: Secondary | ICD-10-CM | POA: Diagnosis not present

## 2016-04-07 DIAGNOSIS — Z7901 Long term (current) use of anticoagulants: Secondary | ICD-10-CM | POA: Diagnosis not present

## 2016-04-07 DIAGNOSIS — H04123 Dry eye syndrome of bilateral lacrimal glands: Secondary | ICD-10-CM | POA: Diagnosis not present

## 2016-04-08 DIAGNOSIS — M50322 Other cervical disc degeneration at C5-C6 level: Secondary | ICD-10-CM | POA: Diagnosis not present

## 2016-04-08 DIAGNOSIS — M9901 Segmental and somatic dysfunction of cervical region: Secondary | ICD-10-CM | POA: Diagnosis not present

## 2016-04-08 DIAGNOSIS — M9902 Segmental and somatic dysfunction of thoracic region: Secondary | ICD-10-CM | POA: Diagnosis not present

## 2016-04-08 DIAGNOSIS — M5134 Other intervertebral disc degeneration, thoracic region: Secondary | ICD-10-CM | POA: Diagnosis not present

## 2016-04-12 DIAGNOSIS — M50322 Other cervical disc degeneration at C5-C6 level: Secondary | ICD-10-CM | POA: Diagnosis not present

## 2016-04-12 DIAGNOSIS — M5134 Other intervertebral disc degeneration, thoracic region: Secondary | ICD-10-CM | POA: Diagnosis not present

## 2016-04-12 DIAGNOSIS — M9901 Segmental and somatic dysfunction of cervical region: Secondary | ICD-10-CM | POA: Diagnosis not present

## 2016-04-12 DIAGNOSIS — M9902 Segmental and somatic dysfunction of thoracic region: Secondary | ICD-10-CM | POA: Diagnosis not present

## 2016-04-19 DIAGNOSIS — M9902 Segmental and somatic dysfunction of thoracic region: Secondary | ICD-10-CM | POA: Diagnosis not present

## 2016-04-19 DIAGNOSIS — M5134 Other intervertebral disc degeneration, thoracic region: Secondary | ICD-10-CM | POA: Diagnosis not present

## 2016-04-19 DIAGNOSIS — M50322 Other cervical disc degeneration at C5-C6 level: Secondary | ICD-10-CM | POA: Diagnosis not present

## 2016-04-19 DIAGNOSIS — M9901 Segmental and somatic dysfunction of cervical region: Secondary | ICD-10-CM | POA: Diagnosis not present

## 2016-05-03 DIAGNOSIS — H04123 Dry eye syndrome of bilateral lacrimal glands: Secondary | ICD-10-CM | POA: Diagnosis not present

## 2016-05-03 DIAGNOSIS — H1859 Other hereditary corneal dystrophies: Secondary | ICD-10-CM | POA: Diagnosis not present

## 2016-05-04 DIAGNOSIS — Z4889 Encounter for other specified surgical aftercare: Secondary | ICD-10-CM | POA: Diagnosis not present

## 2016-05-04 DIAGNOSIS — H04001 Unspecified dacryoadenitis, right lacrimal gland: Secondary | ICD-10-CM | POA: Diagnosis not present

## 2016-05-04 DIAGNOSIS — Z961 Presence of intraocular lens: Secondary | ICD-10-CM | POA: Diagnosis not present

## 2016-05-04 DIAGNOSIS — Z8669 Personal history of other diseases of the nervous system and sense organs: Secondary | ICD-10-CM | POA: Diagnosis not present

## 2016-05-04 DIAGNOSIS — H02002 Unspecified entropion of right lower eyelid: Secondary | ICD-10-CM | POA: Diagnosis not present

## 2016-05-04 DIAGNOSIS — H1859 Other hereditary corneal dystrophies: Secondary | ICD-10-CM | POA: Diagnosis not present

## 2016-05-10 DIAGNOSIS — R8299 Other abnormal findings in urine: Secondary | ICD-10-CM | POA: Diagnosis not present

## 2016-05-10 DIAGNOSIS — N183 Chronic kidney disease, stage 3 (moderate): Secondary | ICD-10-CM | POA: Diagnosis not present

## 2016-05-10 DIAGNOSIS — E784 Other hyperlipidemia: Secondary | ICD-10-CM | POA: Diagnosis not present

## 2016-05-10 DIAGNOSIS — Z7901 Long term (current) use of anticoagulants: Secondary | ICD-10-CM | POA: Diagnosis not present

## 2016-05-10 DIAGNOSIS — Z0000 Encounter for general adult medical examination without abnormal findings: Secondary | ICD-10-CM | POA: Diagnosis not present

## 2016-05-10 DIAGNOSIS — M810 Age-related osteoporosis without current pathological fracture: Secondary | ICD-10-CM | POA: Diagnosis not present

## 2016-05-21 DIAGNOSIS — J028 Acute pharyngitis due to other specified organisms: Secondary | ICD-10-CM | POA: Diagnosis not present

## 2016-05-21 DIAGNOSIS — Z6837 Body mass index (BMI) 37.0-37.9, adult: Secondary | ICD-10-CM | POA: Diagnosis not present

## 2016-05-21 DIAGNOSIS — J0100 Acute maxillary sinusitis, unspecified: Secondary | ICD-10-CM | POA: Diagnosis not present

## 2016-05-21 DIAGNOSIS — J111 Influenza due to unidentified influenza virus with other respiratory manifestations: Secondary | ICD-10-CM | POA: Diagnosis not present

## 2016-05-24 DIAGNOSIS — D6489 Other specified anemias: Secondary | ICD-10-CM | POA: Diagnosis not present

## 2016-05-24 DIAGNOSIS — I10 Essential (primary) hypertension: Secondary | ICD-10-CM | POA: Diagnosis not present

## 2016-05-24 DIAGNOSIS — I480 Paroxysmal atrial fibrillation: Secondary | ICD-10-CM | POA: Diagnosis not present

## 2016-05-24 DIAGNOSIS — E784 Other hyperlipidemia: Secondary | ICD-10-CM | POA: Diagnosis not present

## 2016-05-24 DIAGNOSIS — R413 Other amnesia: Secondary | ICD-10-CM | POA: Diagnosis not present

## 2016-05-24 DIAGNOSIS — Z1389 Encounter for screening for other disorder: Secondary | ICD-10-CM | POA: Diagnosis not present

## 2016-05-24 DIAGNOSIS — Z6828 Body mass index (BMI) 28.0-28.9, adult: Secondary | ICD-10-CM | POA: Diagnosis not present

## 2016-05-24 DIAGNOSIS — Z7901 Long term (current) use of anticoagulants: Secondary | ICD-10-CM | POA: Diagnosis not present

## 2016-05-24 DIAGNOSIS — Z0000 Encounter for general adult medical examination without abnormal findings: Secondary | ICD-10-CM | POA: Diagnosis not present

## 2016-05-24 DIAGNOSIS — N183 Chronic kidney disease, stage 3 (moderate): Secondary | ICD-10-CM | POA: Diagnosis not present

## 2016-05-24 DIAGNOSIS — R072 Precordial pain: Secondary | ICD-10-CM | POA: Diagnosis not present

## 2016-05-24 DIAGNOSIS — M810 Age-related osteoporosis without current pathological fracture: Secondary | ICD-10-CM | POA: Diagnosis not present

## 2016-06-03 DIAGNOSIS — Z888 Allergy status to other drugs, medicaments and biological substances status: Secondary | ICD-10-CM | POA: Diagnosis not present

## 2016-06-03 DIAGNOSIS — Z9842 Cataract extraction status, left eye: Secondary | ICD-10-CM | POA: Diagnosis not present

## 2016-06-03 DIAGNOSIS — Z9841 Cataract extraction status, right eye: Secondary | ICD-10-CM | POA: Diagnosis not present

## 2016-06-03 DIAGNOSIS — H04541 Stenosis of right lacrimal canaliculi: Secondary | ICD-10-CM | POA: Diagnosis not present

## 2016-06-03 DIAGNOSIS — B309 Viral conjunctivitis, unspecified: Secondary | ICD-10-CM | POA: Diagnosis not present

## 2016-06-03 DIAGNOSIS — Z961 Presence of intraocular lens: Secondary | ICD-10-CM | POA: Diagnosis not present

## 2016-06-03 DIAGNOSIS — I10 Essential (primary) hypertension: Secondary | ICD-10-CM | POA: Diagnosis not present

## 2016-06-03 DIAGNOSIS — Z79899 Other long term (current) drug therapy: Secondary | ICD-10-CM | POA: Diagnosis not present

## 2016-06-03 DIAGNOSIS — Z881 Allergy status to other antibiotic agents status: Secondary | ICD-10-CM | POA: Diagnosis not present

## 2016-06-03 DIAGNOSIS — I4891 Unspecified atrial fibrillation: Secondary | ICD-10-CM | POA: Diagnosis not present

## 2016-06-03 DIAGNOSIS — Z880 Allergy status to penicillin: Secondary | ICD-10-CM | POA: Diagnosis not present

## 2016-06-22 DIAGNOSIS — R350 Frequency of micturition: Secondary | ICD-10-CM | POA: Diagnosis not present

## 2016-06-22 DIAGNOSIS — R3915 Urgency of urination: Secondary | ICD-10-CM | POA: Diagnosis not present

## 2016-06-22 DIAGNOSIS — N952 Postmenopausal atrophic vaginitis: Secondary | ICD-10-CM | POA: Diagnosis not present

## 2016-06-22 DIAGNOSIS — N390 Urinary tract infection, site not specified: Secondary | ICD-10-CM | POA: Diagnosis not present

## 2016-06-24 DIAGNOSIS — Z7901 Long term (current) use of anticoagulants: Secondary | ICD-10-CM | POA: Diagnosis not present

## 2016-06-24 DIAGNOSIS — I480 Paroxysmal atrial fibrillation: Secondary | ICD-10-CM | POA: Diagnosis not present

## 2016-07-12 DIAGNOSIS — M9902 Segmental and somatic dysfunction of thoracic region: Secondary | ICD-10-CM | POA: Diagnosis not present

## 2016-07-12 DIAGNOSIS — M5134 Other intervertebral disc degeneration, thoracic region: Secondary | ICD-10-CM | POA: Diagnosis not present

## 2016-07-12 DIAGNOSIS — M5136 Other intervertebral disc degeneration, lumbar region: Secondary | ICD-10-CM | POA: Diagnosis not present

## 2016-07-12 DIAGNOSIS — M9903 Segmental and somatic dysfunction of lumbar region: Secondary | ICD-10-CM | POA: Diagnosis not present

## 2016-07-15 DIAGNOSIS — I480 Paroxysmal atrial fibrillation: Secondary | ICD-10-CM | POA: Diagnosis not present

## 2016-07-15 DIAGNOSIS — E785 Hyperlipidemia, unspecified: Secondary | ICD-10-CM | POA: Diagnosis not present

## 2016-07-15 DIAGNOSIS — Z7901 Long term (current) use of anticoagulants: Secondary | ICD-10-CM | POA: Diagnosis not present

## 2016-07-15 DIAGNOSIS — Z8673 Personal history of transient ischemic attack (TIA), and cerebral infarction without residual deficits: Secondary | ICD-10-CM | POA: Diagnosis not present

## 2016-07-15 DIAGNOSIS — K219 Gastro-esophageal reflux disease without esophagitis: Secondary | ICD-10-CM | POA: Diagnosis not present

## 2016-07-15 DIAGNOSIS — E663 Overweight: Secondary | ICD-10-CM | POA: Diagnosis not present

## 2016-07-15 DIAGNOSIS — I119 Hypertensive heart disease without heart failure: Secondary | ICD-10-CM | POA: Diagnosis not present

## 2016-07-19 DIAGNOSIS — M9903 Segmental and somatic dysfunction of lumbar region: Secondary | ICD-10-CM | POA: Diagnosis not present

## 2016-07-19 DIAGNOSIS — M9902 Segmental and somatic dysfunction of thoracic region: Secondary | ICD-10-CM | POA: Diagnosis not present

## 2016-07-19 DIAGNOSIS — M5134 Other intervertebral disc degeneration, thoracic region: Secondary | ICD-10-CM | POA: Diagnosis not present

## 2016-07-19 DIAGNOSIS — M5136 Other intervertebral disc degeneration, lumbar region: Secondary | ICD-10-CM | POA: Diagnosis not present

## 2016-07-21 DIAGNOSIS — H02231 Paralytic lagophthalmos right upper eyelid: Secondary | ICD-10-CM | POA: Diagnosis not present

## 2016-07-21 DIAGNOSIS — Z9842 Cataract extraction status, left eye: Secondary | ICD-10-CM | POA: Diagnosis not present

## 2016-07-21 DIAGNOSIS — Z881 Allergy status to other antibiotic agents status: Secondary | ICD-10-CM | POA: Diagnosis not present

## 2016-07-21 DIAGNOSIS — Z79899 Other long term (current) drug therapy: Secondary | ICD-10-CM | POA: Diagnosis not present

## 2016-07-21 DIAGNOSIS — Z961 Presence of intraocular lens: Secondary | ICD-10-CM | POA: Diagnosis not present

## 2016-07-21 DIAGNOSIS — I10 Essential (primary) hypertension: Secondary | ICD-10-CM | POA: Diagnosis not present

## 2016-07-21 DIAGNOSIS — Z9841 Cataract extraction status, right eye: Secondary | ICD-10-CM | POA: Diagnosis not present

## 2016-07-21 DIAGNOSIS — Z880 Allergy status to penicillin: Secondary | ICD-10-CM | POA: Diagnosis not present

## 2016-07-21 DIAGNOSIS — Z888 Allergy status to other drugs, medicaments and biological substances status: Secondary | ICD-10-CM | POA: Diagnosis not present

## 2016-07-21 DIAGNOSIS — H04201 Unspecified epiphora, right lacrimal gland: Secondary | ICD-10-CM | POA: Diagnosis not present

## 2016-07-29 DIAGNOSIS — Z7901 Long term (current) use of anticoagulants: Secondary | ICD-10-CM | POA: Diagnosis not present

## 2016-07-29 DIAGNOSIS — I480 Paroxysmal atrial fibrillation: Secondary | ICD-10-CM | POA: Diagnosis not present

## 2016-07-30 ENCOUNTER — Encounter: Payer: Self-pay | Admitting: Neurology

## 2016-08-02 DIAGNOSIS — N390 Urinary tract infection, site not specified: Secondary | ICD-10-CM | POA: Diagnosis not present

## 2016-08-03 DIAGNOSIS — G458 Other transient cerebral ischemic attacks and related syndromes: Secondary | ICD-10-CM | POA: Diagnosis not present

## 2016-08-03 DIAGNOSIS — R296 Repeated falls: Secondary | ICD-10-CM | POA: Diagnosis not present

## 2016-08-03 DIAGNOSIS — R2681 Unsteadiness on feet: Secondary | ICD-10-CM | POA: Diagnosis not present

## 2016-08-03 DIAGNOSIS — R413 Other amnesia: Secondary | ICD-10-CM | POA: Diagnosis not present

## 2016-08-03 DIAGNOSIS — F329 Major depressive disorder, single episode, unspecified: Secondary | ICD-10-CM | POA: Diagnosis not present

## 2016-08-03 DIAGNOSIS — G4733 Obstructive sleep apnea (adult) (pediatric): Secondary | ICD-10-CM | POA: Diagnosis not present

## 2016-08-03 DIAGNOSIS — Z6829 Body mass index (BMI) 29.0-29.9, adult: Secondary | ICD-10-CM | POA: Diagnosis not present

## 2016-08-04 ENCOUNTER — Ambulatory Visit (INDEPENDENT_AMBULATORY_CARE_PROVIDER_SITE_OTHER): Payer: Medicare Other | Admitting: Neurology

## 2016-08-04 ENCOUNTER — Encounter: Payer: Self-pay | Admitting: Neurology

## 2016-08-04 VITALS — BP 118/50 | HR 67 | Temp 97.7°F | Wt 166.0 lb

## 2016-08-04 DIAGNOSIS — F439 Reaction to severe stress, unspecified: Secondary | ICD-10-CM | POA: Diagnosis not present

## 2016-08-04 NOTE — Patient Instructions (Signed)
I don't think you are having TIAs.  I think it is related to stress

## 2016-08-04 NOTE — Progress Notes (Signed)
NEUROLOGY CONSULTATION NOTE  Genesys Coggeshall MRN: [id number] DOB: 06-16-1933  Referring provider: Dr. Philip [name] Primary care provider: Dr. Philip [name]  Reason for consult:  TIA  HISTORY OF PRESENT ILLNESS: [name] is an 83 year old right-handed female with paroxysmal atrial fibrillation, hypertension, hyperlipidemia, and GERD who presents for TIA.  She is accompanied by her husband who supplements history.  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  About 2 weeks ago, she began experiencing episodes where the right side of her jaw felt like it wouldn't work right.  It one only last for a second.  Frequency varies from a couple of times a day to every few days.  She has significant baseline right facial droop, but hasn't noticed any worsening weakness.  She denies right facial numbness, slurred speech, jaw pain, lockjaw, visual disturbance or associated right upper or lower extremity weakness.  It occurs when she feels aggravated, such as arguing with her husband.  She reports that she had a TIA about 10 to 15 years ago, presenting with left sided weakness.  MRI of brain with and without contrast from 02/07/16 was personally reviewed and revealed moderate cerebral atrophy with chronic small vessel ischemic changes and remote infarcts in the anterior and posterior circulation, as well as hypertensive-related chronic microhemorrhages.  She is on Coumadin for secondary stroke prevention She takes atorvastatin 20mg  daily for hyperlipidemia She takes losartan, metoprolol and chlorthaliidone for blood pressure.  PAST MEDICAL HISTORY: Past Medical History:  Diagnosis Date  . Arthritis    "fingers, hips, feet, ankles" (11/10/2012)  . Asthma   . Atrial fibrillation (HCC)    CHRONIC COUMADIN  . Breast cancer ([place]) 1990's   "cancer on one side; precancerous tissue on the other" (11/10/2012)  . Chronic bronchitis ([place])    "multiple times; not in a long time"  (11/10/2012  . Depression   . GERD (gastroesophageal reflux disease)   . Hearing impaired   . Hypertension   . Irritable bowel   . Lymphedema    HX OF - IN LEFT ARM--NO NEEDLES OR B/P'S LEFT ARM  . Macular degeneration    "BEGINNINGS OF" MACULAR DEGENERATION  . Pneumonia    "multiple times; not in a long time" (11/10/2012)  . Recurrent UTI   . Sleep apnea    "dx'd w/it; don't wear mask or anything" (11/10/2012)  . Stroke ([place]) ~ 2005   HX OF TIA-NO RESIDUAL PROBLEM--PARALYSIS RT SIDE FACE /PT'S MOUTH DROOPS-AND LOSS OF HEARING RT EAR --SINCE EAR SURGERY AS A CHILD   . UTI (lower urinary tract infection)    FREQUENT    PAST SURGICAL HISTORY: Past Surgical History:  Procedure Laterality Date  . BREAST BIOPSY Bilateral 1990's  . Dacryocystorhinostomy  02/18/2016  . INNER EAR SURGERY Right    MULTIPLE EAR SURGERIES,  . JOINT REPLACEMENT    . KNEE ARTHROSCOPY  04/07/2012   Procedure: ARTHROSCOPY KNEE;  Surgeon: Carrie Alf, MD;  Location: [facility];  Service: Orthopedics;  Laterality: Left;  WITH SYNOVECTOMY  . MASTECTOMY Bilateral 1990's  . MASTOIDECTOMY Right 1942  . TONSILLECTOMY    . TOTAL KNEE ARTHROPLASTY  09/20/2011   LEFT TOTAL KNEE ARTHROPLASTY;  Surgeon: Carrie Alf, MD;  Location: [facility];  Service: Orthopedics;  Laterality: Left;  . TOTAL KNEE ARTHROPLASTY  2006    MEDICATIONS: Current Outpatient Prescriptions on File Prior to Visit  Medication Sig Dispense Refill  . atorvastatin (LIPITOR) 20 MG  tablet Take 20 mg by mouth daily.    . chlorthalidone (HYGROTON) 50 MG tablet Take 50 mg by mouth every morning.    Marland Kitchen CRANBERRY PO Take 1 capsule by mouth 2 (two) times daily.     Marland Kitchen erythromycin ophthalmic ointment Place 1 application into both eyes 2 (two) times daily.    Marland Kitchen losartan (COZAAR) 100 MG tablet Take 100 mg by mouth daily.    . metoprolol succinate (TOPROL-XL) 25 MG 24 hr tablet Take 1 tablet (25 mg total) by mouth daily. (Patient taking  differently: Take 25 mg by mouth 2 (two) times daily. ) 30 tablet 12  . mirabegron ER (MYRBETRIQ) 25 MG TB24 tablet Take 25 mg by mouth every evening.    . neomycin-polymyxin b-dexamethasone (MAXITROL) 3.5-10000-0.1 SUSP Place 1 drop into both eyes 4 (four) times daily.     Marland Kitchen warfarin (COUMADIN) 5 MG tablet Take 7.5 mg by mouth every Monday, Wednesday, and Friday.      No current facility-administered medications on file prior to visit.     ALLERGIES: Allergies  Allergen Reactions  . Ampicillin     C-DIF AFTER TAKING AMPICILLIN     . Ciprofloxacin     RASH PT STATES ANY OTHER DRUGS IN [facility] FAMILY SHE IS ALLERGIC TO  . Clindamycin/Lincomycin     PT STATES HER DOCTOR TOLD HER NOT TO TAKE CLINDAMYCIN BECAUSE SHE GOT C-DIFF AFTER TAKING AMPICILLIN  . Latex Other (See Comments)    Blisters   . Pradaxa [Dabigatran Etexilate Mesylate]     INTERNAL BLEEDING    FAMILY HISTORY: No family history on file.  SOCIAL HISTORY: Social History   Social History  . Marital status: Married    Spouse name: N/A  . Number of children: N/A  . Years of education: N/A   Occupational History  . Not on file.   Social History Main Topics  . Smoking status: Never Smoker  . Smokeless tobacco: Never Used  . Alcohol use No  . Drug use: No  . Sexual activity: Not on file   Other Topics Concern  . Not on file   Social History Narrative  . No narrative on file    REVIEW OF SYSTEMS: Constitutional: No fevers, chills, or sweats, no generalized fatigue, change in appetite Eyes: No visual changes, double vision, eye pain Ear, nose and throat: No hearing loss, ear pain, nasal congestion, sore throat Cardiovascular: No chest pain, palpitations Respiratory:  No shortness of breath at rest or with exertion, wheezes GastrointestinaI: No nausea, vomiting, diarrhea, abdominal pain, fecal incontinence Genitourinary:  No dysuria, urinary retention or frequency Musculoskeletal:  No neck pain, back  pain Integumentary: No rash, pruritus, skin lesions Neurological: as above Psychiatric: No depression, insomnia, anxiety Endocrine: No palpitations, fatigue, diaphoresis, mood swings, change in appetite, change in weight, increased thirst Hematologic/Lymphatic:  No purpura, petechiae. Allergic/Immunologic: no itchy/runny eyes, nasal congestion, recent allergic reactions, rashes  PHYSICAL EXAM: Vitals:   08/04/16 1026  BP: (!) 118/50  Pulse: 67  Temp: 97.7 F (36.5 C)   General: No acute distress.  Patient appears well-groomed.  Head:  Normocephalic/atraumatic Eyes:  fundi examined but not visualized Neck: supple, no paraspinal tenderness, full range of motion Back: No paraspinal tenderness Heart: regular rate and rhythm Lungs: Clear to auscultation bilaterally. Vascular: No carotid bruits. Neurological Exam: Mental status: alert and oriented to person, place, and time, recent and remote memory intact, fund of knowledge intact, attention and concentration intact, speech fluent and not dysarthric, language  intact. Cranial nerves: CN I: not tested CN II: pupils equal, round and reactive to light, visual fields intact CN III, IV, VI:  full range of motion, no nystagmus, no ptosis CN V: facial sensation intact CN VII: Right upper and lower facial weakness CN VIII: hearing intact CN IX, X: gag intact, uvula midline CN XI: sternocleidomastoid and trapezius muscles intact CN XII: Tongue slightly to the right Bulk & Tone: normal, no fasciculations. Motor:  5/5 throughout  Sensation: temperature and vibration sensation intact. Deep Tendon Reflexes:  2+ throughout, toes downgoing.  Finger to nose testing:  Without dysmetria.  Heel to shin:  Without dysmetria.  Gait:  Wide-based.  Able to turn but not tandem walk. Romberg negative.  IMPRESSION: What she describes is very vague.  I don't think it is TIA.  These episodes are just for a second without any other lateralizing features.   Other than her baseline right facial weakness, she has a non-focal exam.  As these events occur when she is feeling stressed or agitated, I suspect it is related to stress.  PLAN: No further neurologic workup warranted.  Thank you for allowing me to take part in the care of this patient.  Metta Clines, DO  CC:  Leanna Battles, MD

## 2016-08-05 DIAGNOSIS — H6123 Impacted cerumen, bilateral: Secondary | ICD-10-CM | POA: Diagnosis not present

## 2016-08-18 DIAGNOSIS — Z6829 Body mass index (BMI) 29.0-29.9, adult: Secondary | ICD-10-CM | POA: Diagnosis not present

## 2016-08-18 DIAGNOSIS — M79644 Pain in right finger(s): Secondary | ICD-10-CM | POA: Diagnosis not present

## 2016-08-30 DIAGNOSIS — N3000 Acute cystitis without hematuria: Secondary | ICD-10-CM | POA: Diagnosis not present

## 2016-08-31 DIAGNOSIS — Z7901 Long term (current) use of anticoagulants: Secondary | ICD-10-CM | POA: Diagnosis not present

## 2016-08-31 DIAGNOSIS — I480 Paroxysmal atrial fibrillation: Secondary | ICD-10-CM | POA: Diagnosis not present

## 2016-09-03 DIAGNOSIS — R05 Cough: Secondary | ICD-10-CM | POA: Diagnosis not present

## 2016-09-03 DIAGNOSIS — R197 Diarrhea, unspecified: Secondary | ICD-10-CM | POA: Diagnosis not present

## 2016-09-03 DIAGNOSIS — R2681 Unsteadiness on feet: Secondary | ICD-10-CM | POA: Diagnosis not present

## 2016-09-03 DIAGNOSIS — Z6828 Body mass index (BMI) 28.0-28.9, adult: Secondary | ICD-10-CM | POA: Diagnosis not present

## 2016-09-06 DIAGNOSIS — K591 Functional diarrhea: Secondary | ICD-10-CM | POA: Diagnosis not present

## 2016-09-06 DIAGNOSIS — R42 Dizziness and giddiness: Secondary | ICD-10-CM | POA: Diagnosis not present

## 2016-09-08 DIAGNOSIS — H1859 Other hereditary corneal dystrophies: Secondary | ICD-10-CM | POA: Diagnosis not present

## 2016-09-08 DIAGNOSIS — H04551 Acquired stenosis of right nasolacrimal duct: Secondary | ICD-10-CM | POA: Diagnosis not present

## 2016-09-08 DIAGNOSIS — Z9889 Other specified postprocedural states: Secondary | ICD-10-CM | POA: Diagnosis not present

## 2016-09-08 DIAGNOSIS — H02052 Trichiasis without entropian right lower eyelid: Secondary | ICD-10-CM | POA: Diagnosis not present

## 2016-09-08 DIAGNOSIS — H02002 Unspecified entropion of right lower eyelid: Secondary | ICD-10-CM | POA: Diagnosis not present

## 2016-09-08 DIAGNOSIS — Z961 Presence of intraocular lens: Secondary | ICD-10-CM | POA: Diagnosis not present

## 2016-09-24 DIAGNOSIS — M50322 Other cervical disc degeneration at C5-C6 level: Secondary | ICD-10-CM | POA: Diagnosis not present

## 2016-09-24 DIAGNOSIS — M9901 Segmental and somatic dysfunction of cervical region: Secondary | ICD-10-CM | POA: Diagnosis not present

## 2016-09-27 ENCOUNTER — Ambulatory Visit: Payer: Medicare Other | Attending: Internal Medicine

## 2016-09-27 DIAGNOSIS — N3000 Acute cystitis without hematuria: Secondary | ICD-10-CM | POA: Diagnosis not present

## 2016-09-27 DIAGNOSIS — R293 Abnormal posture: Secondary | ICD-10-CM | POA: Diagnosis not present

## 2016-09-27 DIAGNOSIS — M6281 Muscle weakness (generalized): Secondary | ICD-10-CM | POA: Insufficient documentation

## 2016-09-27 DIAGNOSIS — R2689 Other abnormalities of gait and mobility: Secondary | ICD-10-CM | POA: Insufficient documentation

## 2016-09-27 NOTE — Therapy (Signed)
[facility] [address], [place], [place], [address] Phone: [phone]   Fax:  [phone]  Physical Therapy Evaluation  Patient Details  Name: [name] MRN: [id number] Date of Birth: 05/23/1933 Referring Provider: Leanna Battles, MD  Encounter Date: 09/27/2016      PT End of Session - 09/27/16 1559    Visit Number 1   Number of Visits 10   Date for PT Re-Evaluation 11/22/16   PT Start Time 9735   PT Stop Time 1605   PT Time Calculation (min) 35 min   Activity Tolerance Patient tolerated treatment well   Behavior During Therapy [facility] for tasks assessed/performed      Past Medical History:  Diagnosis Date  . Arthritis    "fingers, hips, feet, ankles" (11/10/2012)  . Asthma   . Atrial fibrillation (HCC)    CHRONIC COUMADIN  . Breast cancer ([place]) 1990's   "cancer on one side; precancerous tissue on the other" (11/10/2012)  . Chronic bronchitis ([place])    "multiple times; not in a long time" (11/10/2012  . Depression   . GERD (gastroesophageal reflux disease)   . Hearing impaired   . Hypertension   . Irritable bowel   . Lymphedema    HX OF - IN LEFT ARM--NO NEEDLES OR B/P'S LEFT ARM  . Macular degeneration    "BEGINNINGS OF" MACULAR DEGENERATION  . Osteoporosis   . Pneumonia    "multiple times; not in a long time" (11/10/2012)  . Recurrent UTI   . Sleep apnea    "dx'd w/it; don't wear mask or anything" (11/10/2012)  . Stroke ([place]) ~ 2005   HX OF TIA-NO RESIDUAL PROBLEM--PARALYSIS RT SIDE FACE /PT'S MOUTH DROOPS-AND LOSS OF HEARING RT EAR --SINCE EAR SURGERY AS A CHILD   . UTI (lower urinary tract infection)    FREQUENT    Past Surgical History:  Procedure Laterality Date  . BREAST BIOPSY Bilateral 1990's  . Dacryocystorhinostomy  02/18/2016  . INNER EAR SURGERY Right    MULTIPLE EAR SURGERIES,  . JOINT REPLACEMENT    . KNEE ARTHROSCOPY  04/07/2012   Procedure: ARTHROSCOPY KNEE;  Surgeon: Gearlean Alf, MD;  Location: [facility];  Service: Orthopedics;  Laterality: Left;  WITH SYNOVECTOMY  . MASTECTOMY Bilateral 1990's  . MASTOIDECTOMY Right 1942  . TONSILLECTOMY    . TOTAL KNEE ARTHROPLASTY  09/20/2011   LEFT TOTAL KNEE ARTHROPLASTY;  Surgeon: Gearlean Alf, MD;  Location: [facility];  Service: Orthopedics;  Laterality: Left;  . TOTAL KNEE ARTHROPLASTY  2006    There were no vitals filed for this visit.       Subjective Assessment - 09/27/16 1534    Subjective Pt presents to PT with complaints of decline in balance over the past years.  Pt is using cane for all gait.  Pt also reports that she feels like she is more flexed in standing.     Pertinent History bil. TKA, breast cancer, osteoporosis   Limitations Standing;Walking   How long can you stand comfortably? fatigue   How long can you walk comfortably? if shopping and using a cart- up to 1 hour   Patient Stated Goals improve balance, reduce falls risk, improve strength   Currently in Pain? No/denies            [facility] PT Assessment - 09/27/16 0001      Assessment   Medical Diagnosis unsteady gait   Referring Provider Leanna Battles, MD  Onset Date/Surgical Date 09/28/14   Next MD Visit none   Prior Therapy for TKA     Precautions   Precautions Fall   Precaution Comments falls risk, osteoporosis, breast cancer     Restrictions   Weight Bearing Restrictions No     Balance Screen   Has the patient fallen in the past 6 months No   Has the patient had a decrease in activity level because of a fear of falling?  No   Is the patient reluctant to leave their home because of a fear of falling?  No     Home Environment   Living Environment Private residence   Living Arrangements Spouse/significant other   Type of [place] to enter   Entrance Stairs-Number of Steps 2   [place] to live on main level with bedroom/bathroom     Prior Function   Level of Independence  Independent   Vocation Retired     Associate Professor   Overall Cognitive Status Within Functional Limits for tasks assessed     Posture/Postural Control   Posture/Postural Control Postural limitations   Postural Limitations Forward head;Rounded Shoulders;Increased thoracic kyphosis;Flexed trunk     ROM / Strength   AROM / PROM / Strength AROM;PROM;Strength     AROM   Overall AROM  Deficits   Overall AROM Comments lag in knee extension bilaterally due to hamstring length     PROM   Overall PROM  Within functional limits for tasks performed     Strength   Overall Strength Deficits   Overall Strength Comments Hip strength 4/5, knee strength 4+/5, ankle DF 4/5 bil.       Transfers   Transfers Sit to Stand;Stand to Sit   Sit to Stand With upper extremity assist;5: Supervision   Sit to Stand Details Verbal cues for technique   Five time sit to stand comments  37 seconds   Stand to Sit With upper extremity assist;5: Supervision   Stand to Sit Details uncontrolled descent     Ambulation/Gait   Ambulation/Gait Yes   Ambulation/Gait Assistance 6: Modified independent (Device/Increase time)   Assistive device Small based quad cane   Gait Pattern Step-through pattern;Decreased stride length;Decreased arm swing - left;Decreased arm swing - right;Shuffle;Trunk flexed     Balance   Balance Assessed Yes     Standardized Balance Assessment   Standardized Balance Assessment Timed Up and Go Test     Timed Up and Go Test   TUG Normal TUG   Normal TUG (seconds) 27   TUG Comments using cane            Objective measurements completed on examination: See above findings.                  PT Education - 09/27/16 1555    Education provided Yes   Education Details seated strength   Person(s) Educated Patient   Methods Explanation;Demonstration;Handout   Comprehension Verbalized understanding;Returned demonstration          PT Short Term Goals - 09/27/16 1538      PT  SHORT TERM GOAL #1   Title be independent in initial HEP   Time 4   Period Weeks   Status New     PT SHORT TERM GOAL #2   Title verbalize and demonstrate understanding of fall prevention   Time 4   Period Weeks   Status New     PT SHORT TERM GOAL #  3   Title perform 5x sit to stand in < or = to 30 seconds to reduce falls risk   Time 4   Period Weeks   Status New     PT SHORT TERM GOAL #4   Title perform TUG in < or = to 23 seconds to reduce falls risk   Time 4   Period Weeks   Status New           PT Long Term Goals - 09/27/16 1605      PT LONG TERM GOAL #1   Title be independent in advanced HEP   Time 8   Period Weeks   Status New     PT LONG TERM GOAL #2   Title improve LE strength to demonstrate stand to sit with controlled descent   Time 8   Period Weeks   Status New     PT LONG TERM GOAL #3   Title perform 5x sit to stand in < or = to 25 seconds   Time 8   Period Weeks   Status New     PT LONG TERM GOAL #4   Title perform TUG in < or = to 20 seconds to reduce falls risk   Time 8   Period Weeks   Status New                Plan - 09/27/16 1652    Clinical Impression Statement Pt presents to PT with progression of balance deficits.  Pt reports that her legs have gotten weaker, she feels unsteady with all gait and has difficulty rising from a chair.  Pt demonstrates LE strength deficits, max UE support with sit to stand, TUG is 27 seconds and 5x sit to stand is 37 seconds.  This testing indiates a high falls risk.  Pt with uncontrolled descent with stand to sit transition.  Pt with shortened step length and poor standing posture with gait.  Pt will benefit from skilled PT for LE strength, balance and endurance training to improve safety at home and in the community.     History and Personal Factors relevant to plan of care: bil. total knee replacements, CVA, osteoporosis   Clinical Presentation Evolving   Clinical Presentation due to: evolving with  instability that is progression and difficulty getting out of chair   Clinical Decision Making Moderate   Rehab Potential Good   PT Frequency 2x / week   PT Duration 8 weeks   PT Treatment/Interventions ADLs/Self Care Home Management;Functional mobility training;Stair training;Gait training;Therapeutic activities;Therapeutic exercise;Neuromuscular re-education;Patient/family education;Manual techniques;Taping   PT Next Visit Plan postural strength, endurance, gait, balance   Consulted and Agree with Plan of Care Patient      Patient will benefit from skilled therapeutic intervention in order to improve the following deficits and impairments:  Abnormal gait, Decreased activity tolerance, Decreased balance, Decreased strength, Postural dysfunction, Improper body mechanics, Impaired flexibility, Decreased safety awareness, Decreased endurance, Difficulty walking  Visit Diagnosis: Other abnormalities of gait and mobility - Plan: PT plan of care cert/re-cert  Muscle weakness (generalized) - Plan: PT plan of care cert/re-cert  Abnormal posture - Plan: PT plan of care cert/re-cert      G-Codes - 29/79/89 1603    Functional Assessment Tool Used (Outpatient Only) 5x sit to stand: 37 seconds, TUG 27 seconds   Functional Limitation Mobility: Walking and moving around   Mobility: Walking and Moving Around Current Status (Q1194) At least 80 percent but less than 100 percent  impaired, limited or restricted   Mobility: Walking and Moving Around Goal Status [phone]) At least 40 percent but less than 60 percent impaired, limited or restricted       Problem List Patient Active Problem List   Diagnosis Date Noted  . Anemia 11/10/2012    Class: Chronic  . Syncope 11/09/2012  . Urinary tract infection, recurrent 11/09/2012  . Hyponatremia 11/09/2012  . Diarrhea 11/09/2012  . Paroxysmal atrial fibrillation ([place]) 11/09/2012  . Hypertension 11/09/2012  . Synovitis of knee 04/07/2012  . Postop  Hypokalemia 09/23/2011  . Postop Acute blood loss anemia 09/21/2011  . Postop Transfusion 09/21/2011  . OA (osteoarthritis) of knee 09/20/2011    Sigurd Sos, PT 09/27/16 5:01 PM  [facility] [address] [place], [place], [address] Phone: [phone]   Fax:  [phone]  Name: [name] MRN: [id number] Date of Birth: February 17, 1934

## 2016-09-27 NOTE — Patient Instructions (Addendum)
KNEE: Extension, Long Arc Quad (Weight)  Place weight around leg. Raise leg until knee is straight. Hold _5__ seconds. Use ___ lb weight. _10__ reps per set (each leg), 4-5__ sets per day, __7_ days per week  Copyright  VHI. All rights reserved.    Reverse Fly / Shoulder Retraction   Extend both arms in front of body at shoulder height, palms down, holding band. Move arms out to sides, squeeze shoulder blades together. Repeat _10__ times. Do _4-5__ sessions per day.   Copyright  VHI. All rights reserved.     Knee Raise   Lift knee and then lower it. Repeat with other knee. Repeat _10__ times each leg. Do _4-5___ sessions per day.  http://gt2.exer.us/445   Copyright  VHI. All rights reserved.  Toe Up   Gently rise up on toes and back on heels. Repeat _20___ times. Do 4-5____ sessions per day.  [facility] [address], [address] , [place] [address] Phone # [phone] Fax [phone]  

## 2016-10-04 DIAGNOSIS — M5136 Other intervertebral disc degeneration, lumbar region: Secondary | ICD-10-CM | POA: Diagnosis not present

## 2016-10-04 DIAGNOSIS — M9903 Segmental and somatic dysfunction of lumbar region: Secondary | ICD-10-CM | POA: Diagnosis not present

## 2016-10-04 DIAGNOSIS — M9905 Segmental and somatic dysfunction of pelvic region: Secondary | ICD-10-CM | POA: Diagnosis not present

## 2016-10-04 DIAGNOSIS — M9904 Segmental and somatic dysfunction of sacral region: Secondary | ICD-10-CM | POA: Diagnosis not present

## 2016-10-06 ENCOUNTER — Ambulatory Visit: Payer: Medicare Other | Admitting: Rehabilitative and Restorative Service Providers"

## 2016-10-06 DIAGNOSIS — M6281 Muscle weakness (generalized): Secondary | ICD-10-CM | POA: Diagnosis not present

## 2016-10-06 DIAGNOSIS — I480 Paroxysmal atrial fibrillation: Secondary | ICD-10-CM | POA: Diagnosis not present

## 2016-10-06 DIAGNOSIS — R2689 Other abnormalities of gait and mobility: Secondary | ICD-10-CM | POA: Diagnosis not present

## 2016-10-06 DIAGNOSIS — R293 Abnormal posture: Secondary | ICD-10-CM | POA: Diagnosis not present

## 2016-10-06 DIAGNOSIS — Z7901 Long term (current) use of anticoagulants: Secondary | ICD-10-CM | POA: Diagnosis not present

## 2016-10-06 NOTE — Therapy (Signed)
[facility] [address], [place], [place], [address] Phone: [phone]   Fax:  [phone]  Physical Therapy Treatment  Patient Details  Name: [name] MRN: [id number] Date of Birth: [birth year] Referring Provider: Leanna Battles, MD  Encounter Date: 10/06/2016      PT End of Session - 10/06/16 1426    Visit Number 2   Number of Visits 10   Date for PT Re-Evaluation 11/22/16   PT Start Time 1402   PT Stop Time 1450   PT Time Calculation (min) 48 min   Equipment Utilized During Treatment Gait belt   Activity Tolerance Patient tolerated treatment well;No increased pain;Patient limited by lethargy   Behavior During Therapy [facility] for tasks assessed/performed      Past Medical History:  Diagnosis Date  . Arthritis    "fingers, hips, feet, ankles" (11/10/2012)  . Asthma   . Atrial fibrillation (HCC)    CHRONIC COUMADIN  . Breast cancer ([place]) 1990's   "cancer on one side; precancerous tissue on the other" (11/10/2012)  . Chronic bronchitis ([place])    "multiple times; not in a long time" (11/10/2012  . Depression   . GERD (gastroesophageal reflux disease)   . Hearing impaired   . Hypertension   . Irritable bowel   . Lymphedema    HX OF - IN LEFT ARM--NO NEEDLES OR B/P'S LEFT ARM  . Macular degeneration    "BEGINNINGS OF" MACULAR DEGENERATION  . Osteoporosis   . Pneumonia    "multiple times; not in a long time" (11/10/2012)  . Recurrent UTI   . Sleep apnea    "dx'd w/it; don't wear mask or anything" (11/10/2012)  . Stroke ([place]) ~ 2005   HX OF TIA-NO RESIDUAL PROBLEM--PARALYSIS RT SIDE FACE /PT'S MOUTH DROOPS-AND LOSS OF HEARING RT EAR --SINCE EAR SURGERY AS A CHILD   . UTI (lower urinary tract infection)    FREQUENT    Past Surgical History:  Procedure Laterality Date  . BREAST BIOPSY Bilateral 1990's  . Dacryocystorhinostomy  02/18/2016  . INNER EAR SURGERY Right    MULTIPLE EAR SURGERIES,  . JOINT  REPLACEMENT    . KNEE ARTHROSCOPY  04/07/2012   Procedure: ARTHROSCOPY KNEE;  Surgeon: Gearlean Alf, MD;  Location: [facility];  Service: Orthopedics;  Laterality: Left;  WITH SYNOVECTOMY  . MASTECTOMY Bilateral 1990's  . MASTOIDECTOMY Right 1942  . TONSILLECTOMY    . TOTAL KNEE ARTHROPLASTY  09/20/2011   LEFT TOTAL KNEE ARTHROPLASTY;  Surgeon: Gearlean Alf, MD;  Location: [facility];  Service: Orthopedics;  Laterality: Left;  . TOTAL KNEE ARTHROPLASTY  2006    There were no vitals filed for this visit.      Subjective Assessment - 10/06/16 1412    Subjective I feel fine. Balance is my primary problem.   Pertinent History bil. TKA, breast cancer, osteoporosis   Limitations Standing;Walking   How long can you stand comfortably? fatigue   How long can you walk comfortably? if shopping and using a cart- up to 1 hour   Patient Stated Goals improve balance, reduce falls risk, improve strength   Currently in Pain? No/denies                         [facility] Adult PT Treatment/Exercise - 10/06/16 0001      Neuro Re-ed    Neuro Re-ed Details  stop/go amb with PT CGA with gait belt with  turns incorporated with pt with 1-2 sec delay in cognition w/o assistive device with obstacle negotiation; standing alt fwd punch x 1 min with PT CGA for safety; static standing with eyes open/eyes closed with and without weighshift for further balance challenge     Knee/Hip Exercises: Standing   Other Standing Knee Exercises STS transfers with concentration on hip abdct strength x 10 with difficulty with eccentric descent; unilat hip abdct x 15, unilat frontal step tap x 15; 6 inch frontal step tap x 20; 6 inch SLR off of step x 10; standing dowel shoulder flex/ext to approx 90 degrees flex x 15; chest press x 15; side stepping 4x14 ft each; retro gait 2x14 ft.; standing dowel chest press x 15     Knee/Hip Exercises: Seated   Long Arc Quad Strengthening;15 reps  isometric hold x  2-3 sec; unilat and bil each                  PT Short Term Goals - 10/06/16 1422      PT SHORT TERM GOAL #1   Title be independent in initial HEP   Time 4   Period Weeks   Status On-going     PT SHORT TERM GOAL #2   Title verbalize and demonstrate understanding of fall prevention   Time 4   Period Weeks   Status On-going     PT SHORT TERM GOAL #3   Title perform 5x sit to stand in < or = to 30 seconds to reduce falls risk   Time 4   Period Weeks   Status On-going     PT SHORT TERM GOAL #4   Title perform TUG in < or = to 23 seconds to reduce falls risk   Time 4   Period Weeks   Status On-going           PT Long Term Goals - 10/06/16 1423      PT LONG TERM GOAL #1   Title be independent in advanced HEP   Time 8   Period Weeks   Status On-going     PT LONG TERM GOAL #2   Title improve LE strength to demonstrate stand to sit with controlled descent   Time 8   Period Weeks   Status On-going     PT LONG TERM GOAL #3   Title perform 5x sit to stand in < or = to 25 seconds   Time 8   Period Weeks   Status On-going     PT LONG TERM GOAL #4   Title perform TUG in < or = to 20 seconds to reduce falls risk   Time 8   Period Weeks   Status On-going               Plan - 10/06/16 1421    Clinical Impression Statement Pt presents to PT with abnormal gait, balance deficit, decreased endurance, and bil LE weakness. pt would benefit from further PT for balance training, endurance/LE strengthening therex, and GT to increase function and decrease risk of falls. Pt noted to be more steady with R SLS.   Rehab Potential Good   PT Frequency 2x / week   PT Duration 8 weeks   PT Treatment/Interventions ADLs/Self Care Home Management;Functional mobility training;Stair training;Gait training;Therapeutic activities;Therapeutic exercise;Neuromuscular re-education;Patient/family education;Manual techniques;Taping   PT Next Visit Plan postural strength,  endurance, gait, balance   Consulted and Agree with Plan of Care Patient  Patient will benefit from skilled therapeutic intervention in order to improve the following deficits and impairments:  Abnormal gait, Decreased activity tolerance, Decreased balance, Decreased strength, Postural dysfunction, Improper body mechanics, Impaired flexibility, Decreased safety awareness, Decreased endurance, Difficulty walking  Visit Diagnosis: Other abnormalities of gait and mobility  Muscle weakness (generalized)  Abnormal posture     Problem List Patient Active Problem List   Diagnosis Date Noted  . Anemia 11/10/2012    Class: Chronic  . Syncope 11/09/2012  . Urinary tract infection, recurrent 11/09/2012  . Hyponatremia 11/09/2012  . Diarrhea 11/09/2012  . Paroxysmal atrial fibrillation ([place]) 11/09/2012  . Hypertension 11/09/2012  . Synovitis of knee 04/07/2012  . Postop Hypokalemia 09/23/2011  . Postop Acute blood loss anemia 09/21/2011  . Postop Transfusion 09/21/2011  . OA (osteoarthritis) of knee 09/20/2011    Genesis Paget,Shakena Callari , PT 10/06/2016, 2:53 PM  [facility] [address] [place], [place], [address] Phone: [phone]   Fax:  [phone]  Name: Aashi Derrington MRN: [id number] Date of Birth: [birth year]

## 2016-10-07 ENCOUNTER — Encounter: Payer: No Typology Code available for payment source | Admitting: Rehabilitative and Restorative Service Providers"

## 2016-10-08 DIAGNOSIS — R399 Unspecified symptoms and signs involving the genitourinary system: Secondary | ICD-10-CM | POA: Diagnosis not present

## 2016-10-11 ENCOUNTER — Ambulatory Visit: Payer: Medicare Other | Admitting: Physical Therapy

## 2016-10-11 ENCOUNTER — Encounter: Payer: Self-pay | Admitting: Physical Therapy

## 2016-10-11 DIAGNOSIS — M6281 Muscle weakness (generalized): Secondary | ICD-10-CM

## 2016-10-11 DIAGNOSIS — R2689 Other abnormalities of gait and mobility: Secondary | ICD-10-CM

## 2016-10-11 DIAGNOSIS — R293 Abnormal posture: Secondary | ICD-10-CM

## 2016-10-11 NOTE — Therapy (Signed)
[facility] [address] [place], [place], [address] Phone: [phone]   Fax:  [phone]  Physical Therapy Treatment  Patient Details  Name: [name] MRN: [id number] Date of Birth: [birth year] Referring Provider: Leanna Battles, MD  Encounter Date: 10/11/2016      PT End of Session - 10/11/16 1230    Visit Number 3   Number of Visits 10   Date for PT Re-Evaluation 11/22/16   PT Start Time 0109   PT Stop Time 1316   PT Time Calculation (min) 45 min   Equipment Utilized During Treatment Gait belt   Activity Tolerance Patient tolerated treatment well;No increased pain;Patient limited by lethargy   Behavior During Therapy [facility] for tasks assessed/performed      Past Medical History:  Diagnosis Date  . Arthritis    "fingers, hips, feet, ankles" (11/10/2012)  . Asthma   . Atrial fibrillation (HCC)    CHRONIC COUMADIN  . Breast cancer ([place]) 1990's   "cancer on one side; precancerous tissue on the other" (11/10/2012)  . Chronic bronchitis ([place])    "multiple times; not in a long time" (11/10/2012  . Depression   . GERD (gastroesophageal reflux disease)   . Hearing impaired   . Hypertension   . Irritable bowel   . Lymphedema    HX OF - IN LEFT ARM--NO NEEDLES OR B/P'S LEFT ARM  . Macular degeneration    "BEGINNINGS OF" MACULAR DEGENERATION  . Osteoporosis   . Pneumonia    "multiple times; not in a long time" (11/10/2012)  . Recurrent UTI   . Sleep apnea    "dx'd w/it; don't wear mask or anything" (11/10/2012)  . Stroke ([place]) ~ 2005   HX OF TIA-NO RESIDUAL PROBLEM--PARALYSIS RT SIDE FACE /PT'S MOUTH DROOPS-AND LOSS OF HEARING RT EAR --SINCE EAR SURGERY AS A CHILD   . UTI (lower urinary tract infection)    FREQUENT    Past Surgical History:  Procedure Laterality Date  . BREAST BIOPSY Bilateral 1990's  . Dacryocystorhinostomy  02/18/2016  . INNER EAR SURGERY Right    MULTIPLE EAR SURGERIES,  . JOINT  REPLACEMENT    . KNEE ARTHROSCOPY  04/07/2012   Procedure: ARTHROSCOPY KNEE;  Surgeon: Gearlean Alf, MD;  [facility];  Service: Orthopedics;  Laterality: Left;  WITH SYNOVECTOMY  . MASTECTOMY Bilateral 1990's  . MASTOIDECTOMY Right 1942  . TONSILLECTOMY    . TOTAL KNEE ARTHROPLASTY  09/20/2011   LEFT TOTAL KNEE ARTHROPLASTY;  Surgeon: Gearlean Alf, MD;  [facility];  Service: Orthopedics;  Laterality: Left;  . TOTAL KNEE ARTHROPLASTY  2006    There were no vitals filed for this visit.      Subjective Assessment - 10/11/16 1235    Subjective I am not in pain .  My balance is not feeling too great   Pertinent History bil. TKA, breast cancer, osteoporosis   Limitations Standing;Walking   How long can you stand comfortably? fatigue   How long can you walk comfortably? if shopping and using a cart- up to 1 hour   Patient Stated Goals improve balance, reduce falls risk, improve strength   Currently in Pain? No/denies                         [facility] Adult PT Treatment/Exercise - 10/11/16 0001      Neuro Re-ed    Neuro Re-ed Details  standing toe taps  on step, walking with ladder fwd and side stepping CGA     Knee/Hip Exercises: Stretches   Active Hamstring Stretch 3 reps;20 seconds  sitting and standing     Knee/Hip Exercises: Standing   Forward Step Up Both;10 reps;Hand Hold: 2;Step Height: 6"     Knee/Hip Exercises: Seated   Long Arc Quad Strengthening;15 reps  4# isometric hold x 2-3 sec; unilat and bil each   Sit to [employer] without UE support;15 reps                PT Education - 10/11/16 1321    Education provided Yes   Education Details tapping step, step ups, hamstring stretch   Person(s) Educated Patient   Methods Explanation;Demonstration;Verbal cues;Handout   Comprehension Verbalized understanding;Returned demonstration          PT Short Term Goals - 10/11/16 1322      PT SHORT TERM GOAL #1   Title be  independent in initial HEP   Time 4   Period Weeks   Status Achieved     PT SHORT TERM GOAL #2   Title verbalize and demonstrate understanding of fall prevention   Time 4   Period Weeks   Status On-going     PT SHORT TERM GOAL #3   Title perform 5x sit to stand in < or = to 30 seconds to reduce falls risk   Time 4   Period Weeks   Status On-going     PT SHORT TERM GOAL #4   Title perform TUG in < or = to 23 seconds to reduce falls risk   Time 4   Period Weeks   Status On-going           PT Long Term Goals - 10/11/16 1324      PT LONG TERM GOAL #1   Title be independent in advanced HEP   Time 8   Period Weeks   Status On-going     PT LONG TERM GOAL #2   Title improve LE strength to demonstrate stand to sit with controlled descent   Time 8   Period Weeks   Status On-going               Plan - 10/11/16 1231    Clinical Impression Statement Patient needs both UE support with standing balance and uses cane and CGA with walking exercises . Pt was able to add to HEP for increased strength and balance.  She will benefit from skilled PT to progress for reduced risk of falls.   PT Treatment/Interventions ADLs/Self Care Home Management;Functional mobility training;Stair training;Gait training;Therapeutic activities;Therapeutic exercise;Neuromuscular re-education;Patient/family education;Manual techniques;Taping   PT Next Visit Plan postural strength, endurance, gait, balance   Consulted and Agree with Plan of Care Patient      Patient will benefit from skilled therapeutic intervention in order to improve the following deficits and impairments:  Abnormal gait, Decreased activity tolerance, Decreased balance, Decreased strength, Postural dysfunction, Improper body mechanics, Impaired flexibility, Decreased safety awareness, Decreased endurance, Difficulty walking  Visit Diagnosis: Other abnormalities of gait and mobility  Muscle weakness (generalized)  Abnormal  posture     Problem List Patient Active Problem List   Diagnosis Date Noted  . Anemia 11/10/2012    Class: Chronic  . Syncope 11/09/2012  . Urinary tract infection, recurrent 11/09/2012  . Hyponatremia 11/09/2012  . Diarrhea 11/09/2012  . Paroxysmal atrial fibrillation ([place]) 11/09/2012  . Hypertension 11/09/2012  . Synovitis of knee 04/07/2012  . Postop  Hypokalemia 09/23/2011  . Postop Acute blood loss anemia 09/21/2011  . Postop Transfusion 09/21/2011  . OA (osteoarthritis) of knee 09/20/2011    Zannie Cove, PT 10/11/2016, 1:29 PM  [facility] [address], [place], [place], [address] Phone: [phone]   Fax:  [phone]  Name: [name] MRN: [id number] Date of Birth: 05/09/1933

## 2016-10-11 NOTE — Patient Instructions (Signed)
   Stand at step and support with your hand as needed on the railing - alternating feet, tap your toe on the step - 20x each side    Heel on step and lean forward - do 10 sec holds 6 x    FRONT STEP-UPS  Step up onto stool or step with one leg.  Step down leading with other leg.  Repeat. 10x on each leg

## 2016-10-15 ENCOUNTER — Ambulatory Visit: Payer: Medicare Other | Admitting: Rehabilitation

## 2016-10-15 ENCOUNTER — Encounter: Payer: Self-pay | Admitting: Rehabilitation

## 2016-10-15 DIAGNOSIS — M9903 Segmental and somatic dysfunction of lumbar region: Secondary | ICD-10-CM | POA: Diagnosis not present

## 2016-10-15 DIAGNOSIS — M9904 Segmental and somatic dysfunction of sacral region: Secondary | ICD-10-CM | POA: Diagnosis not present

## 2016-10-15 DIAGNOSIS — R2689 Other abnormalities of gait and mobility: Secondary | ICD-10-CM | POA: Diagnosis not present

## 2016-10-15 DIAGNOSIS — R293 Abnormal posture: Secondary | ICD-10-CM | POA: Diagnosis not present

## 2016-10-15 DIAGNOSIS — M5136 Other intervertebral disc degeneration, lumbar region: Secondary | ICD-10-CM | POA: Diagnosis not present

## 2016-10-15 DIAGNOSIS — M6281 Muscle weakness (generalized): Secondary | ICD-10-CM | POA: Diagnosis not present

## 2016-10-15 DIAGNOSIS — M9905 Segmental and somatic dysfunction of pelvic region: Secondary | ICD-10-CM | POA: Diagnosis not present

## 2016-10-15 NOTE — Therapy (Signed)
[facility] [address] [place], [place], [address] Phone: [phone]   Fax:  [phone]  Physical Therapy Treatment  Patient Details  Name: [name] MRN: [id number] Date of Birth: 07/24/1933 Referring Provider: Leanna Battles, MD  Encounter Date: 10/15/2016      PT End of Session - 10/15/16 1134    Visit Number 4   Number of Visits 10   Date for PT Re-Evaluation 11/22/16   PT Start Time 1100   PT Stop Time 1145   PT Time Calculation (min) 45 min   Activity Tolerance Patient tolerated treatment well      Past Medical History:  Diagnosis Date  . Arthritis    "fingers, hips, feet, ankles" (11/10/2012)  . Asthma   . Atrial fibrillation (HCC)    CHRONIC COUMADIN  . Breast cancer ([place]) 1990's   "cancer on one side; precancerous tissue on the other" (11/10/2012)  . Chronic bronchitis ([place])    "multiple times; not in a long time" (11/10/2012  . Depression   . GERD (gastroesophageal reflux disease)   . Hearing impaired   . Hypertension   . Irritable bowel   . Lymphedema    HX OF - IN LEFT ARM--NO NEEDLES OR B/P'S LEFT ARM  . Macular degeneration    "BEGINNINGS OF" MACULAR DEGENERATION  . Osteoporosis   . Pneumonia    "multiple times; not in a long time" (11/10/2012)  . Recurrent UTI   . Sleep apnea    "dx'd w/it; don't wear mask or anything" (11/10/2012)  . Stroke ([place]) ~ 2005   HX OF TIA-NO RESIDUAL PROBLEM--PARALYSIS RT SIDE FACE /PT'S MOUTH DROOPS-AND LOSS OF HEARING RT EAR --SINCE EAR SURGERY AS A CHILD   . UTI (lower urinary tract infection)    FREQUENT    Past Surgical History:  Procedure Laterality Date  . BREAST BIOPSY Bilateral 1990's  . Dacryocystorhinostomy  02/18/2016  . INNER EAR SURGERY Right    MULTIPLE EAR SURGERIES,  . JOINT REPLACEMENT    . KNEE ARTHROSCOPY  04/07/2012   Procedure: ARTHROSCOPY KNEE;  Surgeon: Gearlean Alf, MD;  Location: [facility];   Service: Orthopedics;  Laterality: Left;  WITH SYNOVECTOMY  . MASTECTOMY Bilateral 1990's  . MASTOIDECTOMY Right 1942  . TONSILLECTOMY    . TOTAL KNEE ARTHROPLASTY  09/20/2011   LEFT TOTAL KNEE ARTHROPLASTY;  Surgeon: Gearlean Alf, MD;  Location: [facility];  Service: Orthopedics;  Laterality: Left;  . TOTAL KNEE ARTHROPLASTY  2006    There were no vitals filed for this visit.      Subjective Assessment - 10/15/16 1100    Subjective I think I am walking better already.     Currently in Pain? No/denies                         [facility] Adult PT Treatment/Exercise - 10/15/16 0001      Neuro Re-ed    Neuro Re-ed Details  6 cone gait weaving CGA  x 4, side steps CGA length of 6 cones, tap ups at stairs CGA no hands x 10     Knee/Hip Exercises: Stretches   Active Hamstring Stretch 3 reps;20 seconds  at stairs     Knee/Hip Exercises: Seated   Long Arc Quad Strengthening;15 reps  Unilateral and bil   Long Arc Quad Weight 4 lbs.   Marching Limitations 4" alt x 10bil   Sit to [place] without  UE support;15 reps                  PT Short Term Goals - 10/11/16 1322      PT SHORT TERM GOAL #1   Title be independent in initial HEP   Time 4   Period Weeks   Status Achieved     PT SHORT TERM GOAL #2   Title verbalize and demonstrate understanding of fall prevention   Time 4   Period Weeks   Status On-going     PT SHORT TERM GOAL #3   Title perform 5x sit to stand in < or = to 30 seconds to reduce falls risk   Time 4   Period Weeks   Status On-going     PT SHORT TERM GOAL #4   Title perform TUG in < or = to 23 seconds to reduce falls risk   Time 4   Period Weeks   Status On-going           PT Long Term Goals - 10/11/16 1324      PT LONG TERM GOAL #1   Title be independent in advanced HEP   Time 8   Period Weeks   Status On-going     PT LONG TERM GOAL #2   Title improve LE strength to demonstrate stand to sit with controlled descent   Time  8   Period Weeks   Status On-going               Plan - 10/15/16 1135    Clinical Impression Statement Pt tolerated all well with CGA.  No LOB throughout.  rest breaks needed due to SOB and fatigue.     PT Treatment/Interventions ADLs/Self Care Home Management;Functional mobility training;Stair training;Gait training;Therapeutic activities;Therapeutic exercise;Neuromuscular re-education;Patient/family education;Manual techniques;Taping   PT Next Visit Plan postural strength, endurance, gait, balance      Patient will benefit from skilled therapeutic intervention in order to improve the following deficits and impairments:  Abnormal gait, Decreased activity tolerance, Decreased balance, Decreased strength, Postural dysfunction, Improper body mechanics, Impaired flexibility, Decreased safety awareness, Decreased endurance, Difficulty walking  Visit Diagnosis: Other abnormalities of gait and mobility  Muscle weakness (generalized)  Abnormal posture     Problem List Patient Active Problem List   Diagnosis Date Noted  . Anemia 11/10/2012    Class: Chronic  . Syncope 11/09/2012  . Urinary tract infection, recurrent 11/09/2012  . Hyponatremia 11/09/2012  . Diarrhea 11/09/2012  . Paroxysmal atrial fibrillation ([place]) 11/09/2012  . Hypertension 11/09/2012  . Synovitis of knee 04/07/2012  . Postop Hypokalemia 09/23/2011  . Postop Acute blood loss anemia 09/21/2011  . Postop Transfusion 09/21/2011  . OA (osteoarthritis) of knee 09/20/2011    Stark Bray, DPT, CMP 10/15/2016, 11:52 AM  [facility] [address] [place], [place], [address] Phone: [phone]   Fax:  [phone]  Name: [name] MRN: [id number] Date of Birth: [birth year]

## 2016-10-18 ENCOUNTER — Ambulatory Visit: Payer: Medicare Other | Admitting: Physical Therapy

## 2016-10-18 ENCOUNTER — Encounter: Payer: Self-pay | Admitting: Physical Therapy

## 2016-10-18 DIAGNOSIS — R293 Abnormal posture: Secondary | ICD-10-CM

## 2016-10-18 DIAGNOSIS — R2689 Other abnormalities of gait and mobility: Secondary | ICD-10-CM

## 2016-10-18 DIAGNOSIS — M6281 Muscle weakness (generalized): Secondary | ICD-10-CM

## 2016-10-18 NOTE — Therapy (Signed)
[facility] [address], [place], [place], [address] Phone: [phone]   Fax:  [phone]  Physical Therapy Treatment  Patient Details  Name: [name] MRN: [id number] Date of Birth: 10/06/1933 Referring Provider: Leanna Battles, MD  Encounter Date: 10/18/2016      PT End of Session - 10/18/16 1537    Visit Number 5   Number of Visits 10   Date for PT Re-Evaluation 11/22/16   PT Start Time 3295   PT Stop Time 1617   PT Time Calculation (min) 43 min   Activity Tolerance Patient tolerated treatment well   Behavior During Therapy [facility] for tasks assessed/performed      Past Medical History:  Diagnosis Date  . Arthritis    "fingers, hips, feet, ankles" (11/10/2012)  . Asthma   . Atrial fibrillation (HCC)    CHRONIC COUMADIN  . Breast cancer ([place]) 1990's   "cancer on one side; precancerous tissue on the other" (11/10/2012)  . Chronic bronchitis ([place])    "multiple times; not in a long time" (11/10/2012  . Depression   . GERD (gastroesophageal reflux disease)   . Hearing impaired   . Hypertension   . Irritable bowel   . Lymphedema    HX OF - IN LEFT ARM--NO NEEDLES OR B/P'S LEFT ARM  . Macular degeneration    "BEGINNINGS OF" MACULAR DEGENERATION  . Osteoporosis   . Pneumonia    "multiple times; not in a long time" (11/10/2012)  . Recurrent UTI   . Sleep apnea    "dx'd w/it; don't wear mask or anything" (11/10/2012)  . 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 UTI (lower urinary tract infection)    FREQUENT    Past Surgical History:  Procedure Laterality Date  . BREAST BIOPSY Bilateral 1990's  . Dacryocystorhinostomy  02/18/2016  . INNER EAR SURGERY Right    MULTIPLE EAR SURGERIES,  . JOINT REPLACEMENT    . KNEE ARTHROSCOPY  04/07/2012   Procedure: ARTHROSCOPY KNEE;  Surgeon: Gearlean Alf, MD;  Location: [facility];  Service: Orthopedics;  Laterality: Left;  WITH SYNOVECTOMY  . MASTECTOMY Bilateral 1990's  . MASTOIDECTOMY Right 1942  . TONSILLECTOMY    . TOTAL KNEE ARTHROPLASTY  09/20/2011   LEFT TOTAL KNEE ARTHROPLASTY;  Surgeon: Gearlean Alf, MD;  Location: [facility];  Service: Orthopedics;  Laterality: Left;  . TOTAL KNEE ARTHROPLASTY  2006    There were no vitals filed for this visit.      Subjective Assessment - 10/18/16 1540    Subjective A little weary today but not bad, no pain.   Currently in Pain? No/denies   Multiple Pain Sites No                         [facility] Adult PT Treatment/Exercise - 10/18/16 0001      Neuro Re-ed    Neuro Re-ed Details  side stepping no UE, atl toe tapping, tall walking with cane 1 1/2 laps around gym      Knee/Hip Exercises: Stretches   Active Hamstring Stretch 3 reps;20 seconds  at stairs     Knee/Hip Exercises: Aerobic   Nustep L1 x 6 min  review of status with pt     Knee/Hip Exercises: Standing   Heel Raises Both;1  set;10 reps   Knee Flexion AROM;Strengthening;Both;10 reps   Hip Abduction AROM;Stengthening;Both;1 set;10 reps;Knee straight   Hip Extension AROM;Stengthening;Both;1 set;10 reps;Knee straight     Knee/Hip Exercises: Seated   Long Arc Quad Strengthening;15 reps  Unilateral and bil   Long Arc Quad Weight 4 lbs.   Long [employer] Limitations skin is sensitive so care putting wts on   [employer] 20x   Sit to [employer] --  5x test: light UE use 23 sec                  PT Short Term Goals - 10/11/16 1322      PT SHORT TERM GOAL #1   Title be independent in initial HEP   Time 4   Period Weeks   Status Achieved     PT SHORT TERM GOAL #2   Title verbalize and demonstrate understanding of fall prevention   Time 4   Period Weeks   Status On-going     PT SHORT TERM GOAL #3   Title perform 5x sit to stand in < or = to 30 seconds to reduce falls risk    Time 4   Period Weeks   Status On-going     PT SHORT TERM GOAL #4   Title perform TUG in < or = to 23 seconds to reduce falls risk   Time 4   Period Weeks   Status On-going           PT Long Term Goals - 10/11/16 1324      PT LONG TERM GOAL #1   Title be independent in advanced HEP   Time 8   Period Weeks   Status On-going     PT LONG TERM GOAL #2   Title improve LE strength to demonstrate stand to sit with controlled descent   Time 8   Period Weeks   Status On-going               Plan - 10/18/16 1539    Clinical Impression Statement Pt is compliant with her HEP. Needed light UE use to complete sit to stand test in which she completed 5x in 21 sec. She could not do 1 rep without UE. Definite fatigue at the end of the session.    Rehab Potential Good   PT Frequency 2x / week   PT Duration 8 weeks   PT Treatment/Interventions ADLs/Self Care Home Management;Functional mobility training;Stair training;Gait training;Therapeutic activities;Therapeutic exercise;Neuromuscular re-education;Patient/family education;Manual techniques;Taping   PT Next Visit Plan postural strength, endurance, gait, balance   Consulted and Agree with Plan of Care Patient      Patient will benefit from skilled therapeutic intervention in order to improve the following deficits and impairments:  Abnormal gait, Decreased activity tolerance, Decreased balance, Decreased strength, Postural dysfunction, Improper body mechanics, Impaired flexibility, Decreased safety awareness, Decreased endurance, Difficulty walking  Visit Diagnosis: Other abnormalities of gait and mobility  Muscle weakness (generalized)  Abnormal posture     Problem List Patient Active Problem List   Diagnosis Date Noted  . Anemia 11/10/2012    Class: Chronic  . Syncope 11/09/2012  . Urinary tract infection, recurrent 11/09/2012  . Hyponatremia 11/09/2012  . Diarrhea 11/09/2012  . Paroxysmal atrial fibrillation  ([place]) 11/09/2012  . Hypertension 11/09/2012  . Synovitis of knee 04/07/2012  . Postop Hypokalemia 09/23/2011  . Postop Acute blood loss anemia 09/21/2011  . Postop Transfusion 09/21/2011  . OA (osteoarthritis) of knee 09/20/2011    Anias Bartol,  PTA 10/18/2016, 4:20 PM  [facility] [address] [place], [place], [address] Phone: [phone]   Fax:  [phone]  Name: Ciaira Natividad MRN: [id number] Date of Birth: 1934-03-06

## 2016-10-22 ENCOUNTER — Ambulatory Visit: Payer: Medicare Other | Admitting: Physical Therapy

## 2016-10-22 ENCOUNTER — Encounter: Payer: Self-pay | Admitting: Physical Therapy

## 2016-10-22 DIAGNOSIS — R293 Abnormal posture: Secondary | ICD-10-CM | POA: Diagnosis not present

## 2016-10-22 DIAGNOSIS — R2689 Other abnormalities of gait and mobility: Secondary | ICD-10-CM

## 2016-10-22 DIAGNOSIS — M6281 Muscle weakness (generalized): Secondary | ICD-10-CM | POA: Diagnosis not present

## 2016-10-22 NOTE — Therapy (Signed)
[facility] [address] [place], [place], [address] Phone: [phone]   Fax:  [phone]  Physical Therapy Treatment  Patient Details  Name: [name] MRN: [id number] Date of Birth: 07-Feb-1934 Referring Provider: Leanna Battles, MD  Encounter Date: 10/22/2016      PT End of Session - 10/22/16 1106    Visit Number 6   Number of Visits 10   Date for PT Re-Evaluation 11/22/16   PT Start Time 9379   PT Stop Time 1151   PT Time Calculation (min) 46 min   Activity Tolerance Patient tolerated treatment well   Behavior During Therapy [facility] for tasks assessed/performed      Past Medical History:  Diagnosis Date  . Arthritis    "fingers, hips, feet, ankles" (11/10/2012)  . Asthma   . Atrial fibrillation (HCC)    CHRONIC COUMADIN  . Breast cancer ([place]) 1990's   "cancer on one side; precancerous tissue on the other" (11/10/2012)  . Chronic bronchitis ([place])    "multiple times; not in a long time" (11/10/2012  . Depression   . GERD (gastroesophageal reflux disease)   . Hearing impaired   . Hypertension   . Irritable bowel   . Lymphedema    HX OF - IN LEFT ARM--NO NEEDLES OR B/P'S LEFT ARM  . Macular degeneration    "BEGINNINGS OF" MACULAR DEGENERATION  . Osteoporosis   . Pneumonia    "multiple times; not in a long time" (11/10/2012)  . Recurrent UTI   . Sleep apnea    "dx'd w/it; don't wear mask or anything" (11/10/2012)  . Stroke ([place]) ~ 2005   HX OF TIA-NO RESIDUAL PROBLEM--PARALYSIS RT SIDE FACE /PT'S MOUTH DROOPS-AND LOSS OF HEARING RT EAR --SINCE EAR SURGERY AS A CHILD   . UTI (lower urinary tract infection)    FREQUENT    Past Surgical History:  Procedure Laterality Date  . BREAST BIOPSY Bilateral 1990's  . Dacryocystorhinostomy  02/18/2016  . INNER EAR SURGERY Right    MULTIPLE EAR SURGERIES,  . JOINT REPLACEMENT    . KNEE ARTHROSCOPY  04/07/2012   Procedure: ARTHROSCOPY KNEE;  Surgeon: Gearlean Alf, MD;  Location: [facility];  Service: Orthopedics;  Laterality: Left;  WITH SYNOVECTOMY  . MASTECTOMY Bilateral 1990's  . MASTOIDECTOMY Right 1942  . TONSILLECTOMY    . TOTAL KNEE ARTHROPLASTY  09/20/2011   LEFT TOTAL KNEE ARTHROPLASTY;  Surgeon: Gearlean Alf, MD;  Location: [facility];  Service: Orthopedics;  Laterality: Left;  . TOTAL KNEE ARTHROPLASTY  2006    There were no vitals filed for this visit.      Subjective Assessment - 10/22/16 1108    Subjective I can tell I am getting better:   Pertinent History bil. TKA, breast cancer, osteoporosis   Currently in Pain? No/denies   Multiple Pain Sites No                         [facility] Adult PT Treatment/Exercise - 10/22/16 0001      Knee/Hip Exercises: Aerobic   Nustep L1 x 7 min  review of status with pt     Knee/Hip Exercises: Standing   Heel Raises Both;1 set;10 reps   Knee Flexion AROM;Strengthening;Both;10 reps   Hip Flexion AROM;Stengthening;Both;10 reps   Hip Abduction AROM;Stengthening;Both;1 set;10 reps;Knee straight   Hip Extension AROM;Stengthening;Both;1 set;10 reps;Knee straight     Knee/Hip Exercises: Seated   Long Arc  Quad Strengthening;Both;2 sets;10 reps;Weights   Long [employer] 4 lbs.   Long [employer] Limitations skin is sensitive so care putting wts on   [employer] 25x  3 sec hold   [place] with TheraBand Red  20x   Abd/Adduction Weights --  Seate yellow band ER at shoulder to answer pt ? regarding ex   Sit to [place] --  Ankle DF/PF on slant board x 4 min for warm ups                  PT Short Term Goals - 10/11/16 1322      PT SHORT TERM GOAL #1   Title be independent in initial HEP   Time 4   Period Weeks   Status Achieved     PT SHORT TERM GOAL #2   Title verbalize and demonstrate understanding of fall prevention   Time 4   Period Weeks   Status On-going     PT SHORT TERM GOAL #3   Title perform 5x sit to stand in < or = to 30  seconds to reduce falls risk   Time 4   Period Weeks   Status On-going     PT SHORT TERM GOAL #4   Title perform TUG in < or = to 23 seconds to reduce falls risk   Time 4   Period Weeks   Status On-going           PT Long Term Goals - 10/11/16 1324      PT LONG TERM GOAL #1   Title be independent in advanced HEP   Time 8   Period Weeks   Status On-going     PT LONG TERM GOAL #2   Title improve LE strength to demonstrate stand to sit with controlled descent   Time 8   Period Weeks   Status On-going               Plan - 10/22/16 1107    Clinical Impression Statement Pt reports she can get up & down easier now, her energy is up, and her walking feels better. Both she and her husband have noticed the improvements since starting PT again.     Rehab Potential Good   PT Frequency 2x / week   PT Duration 8 weeks   PT Treatment/Interventions ADLs/Self Care Home Management;Functional mobility training;Stair training;Gait training;Therapeutic activities;Therapeutic exercise;Neuromuscular re-education;Patient/family education;Manual techniques;Taping   PT Next Visit Plan postural strength, endurance, gait, balance, add 1# to standing exs next   Consulted and Agree with Plan of Care Patient      Patient will benefit from skilled therapeutic intervention in order to improve the following deficits and impairments:  Abnormal gait, Decreased activity tolerance, Decreased balance, Decreased strength, Postural dysfunction, Improper body mechanics, Impaired flexibility, Decreased safety awareness, Decreased endurance, Difficulty walking  Visit Diagnosis: Other abnormalities of gait and mobility  Muscle weakness (generalized)  Abnormal posture     Problem List Patient Active Problem List   Diagnosis Date Noted  . Anemia 11/10/2012    Class: Chronic  . Syncope 11/09/2012  . Urinary tract infection, recurrent 11/09/2012  . Hyponatremia 11/09/2012  . Diarrhea 11/09/2012   . Paroxysmal atrial fibrillation ([place]) 11/09/2012  . Hypertension 11/09/2012  . Synovitis of knee 04/07/2012  . Postop Hypokalemia 09/23/2011  . Postop Acute blood loss anemia 09/21/2011  . Postop Transfusion 09/21/2011  . OA (osteoarthritis) of knee 09/20/2011    Myrissa Chipley, PTA 10/22/2016, 11:52 AM  [facility] [address] [place], [place], [address] Phone: [phone]   Fax:  [phone]  Name: [name] MRN: [id number] Date of Birth: [birth year]

## 2016-10-25 ENCOUNTER — Encounter: Payer: Self-pay | Admitting: Physical Therapy

## 2016-10-25 ENCOUNTER — Ambulatory Visit: Payer: Medicare Other | Admitting: Physical Therapy

## 2016-10-25 DIAGNOSIS — R293 Abnormal posture: Secondary | ICD-10-CM

## 2016-10-25 DIAGNOSIS — M6281 Muscle weakness (generalized): Secondary | ICD-10-CM

## 2016-10-25 DIAGNOSIS — R2689 Other abnormalities of gait and mobility: Secondary | ICD-10-CM | POA: Diagnosis not present

## 2016-10-25 NOTE — Patient Instructions (Signed)

## 2016-10-25 NOTE — Therapy (Signed)
[facility] [address] [place], [place], [address] Phone: [phone]   Fax:  [phone]  Physical Therapy Treatment  Patient Details  Name: [name] MRN: [id number] Date of Birth: 1933-12-14 Referring Provider: Leanna Battles, MD  Encounter Date: 10/25/2016      PT End of Session - 10/25/16 1538    Visit Number 7   Number of Visits 10   Date for PT Re-Evaluation 11/22/16   PT Start Time 0354   PT Stop Time 1616   PT Time Calculation (min) 41 min   Activity Tolerance Patient tolerated treatment well   Behavior During Therapy [facility] for tasks assessed/performed      Past Medical History:  Diagnosis Date  . Arthritis    "fingers, hips, feet, ankles" (11/10/2012)  . Asthma   . Atrial fibrillation (HCC)    CHRONIC COUMADIN  . Breast cancer ([place]) 1990's   "cancer on one side; precancerous tissue on the other" (11/10/2012)  . Chronic bronchitis ([place])    "multiple times; not in a long time" (11/10/2012  . Depression   . GERD (gastroesophageal reflux disease)   . Hearing impaired   . Hypertension   . Irritable bowel   . Lymphedema    HX OF - IN LEFT ARM--NO NEEDLES OR B/P'S LEFT ARM  . Macular degeneration    "BEGINNINGS OF" MACULAR DEGENERATION  . Osteoporosis   . Pneumonia    "multiple times; not in a long time" (11/10/2012)  . Recurrent UTI   . Sleep apnea    "dx'd w/it; don't wear mask or anything" (11/10/2012)  . Stroke ([place]) ~ 2005   HX OF TIA-NO RESIDUAL PROBLEM--PARALYSIS RT SIDE FACE /PT'S MOUTH DROOPS-AND LOSS OF HEARING RT EAR --SINCE EAR SURGERY AS A CHILD   . UTI (lower urinary tract infection)    FREQUENT    Past Surgical History:  Procedure Laterality Date  . BREAST BIOPSY Bilateral 1990's  . Dacryocystorhinostomy  02/18/2016  . INNER EAR SURGERY Right    MULTIPLE EAR SURGERIES,  . JOINT REPLACEMENT    . KNEE ARTHROSCOPY  04/07/2012   Procedure: ARTHROSCOPY KNEE;  Surgeon: Gearlean Alf, MD;  Location: [facility];  Service: Orthopedics;  Laterality: Left;  WITH SYNOVECTOMY  . MASTECTOMY Bilateral 1990's  . MASTOIDECTOMY Right 1942  . TONSILLECTOMY    . TOTAL KNEE ARTHROPLASTY  09/20/2011   LEFT TOTAL KNEE ARTHROPLASTY;  Surgeon: Gearlean Alf, MD;  Location: [facility];  Service: Orthopedics;  Laterality: Left;  . TOTAL KNEE ARTHROPLASTY  2006    There were no vitals filed for this visit.      Subjective Assessment - 10/25/16 1539    Subjective All this rain is making me sleepy, I sleep too much then I get stiff. My joints also get achey.   Pertinent History bil. TKA, breast cancer, osteoporosis   Currently in Pain? No/denies   Multiple Pain Sites No                         [facility] Adult PT Treatment/Exercise - 10/25/16 0001      High Level Balance   High Level Balance Activities --  SLS with tapping on step 10x with opposite foot.   High Level Balance Comments Standing & tap toes behind her/hip extension 10x with ligh UE support  2 laps of walking with cane around clinic, VC for posture     Knee/Hip Exercises:  Stretches   Active Hamstring Stretch 3 reps;20 seconds  at stairs     Knee/Hip Exercises: Aerobic   Nustep L1 x 8 min   PTA discussed status     Knee/Hip Exercises: Seated   Long Arc Quad Strengthening;Both;2 sets;10 reps;Weights   Long Arc Quad Weight 4 lbs.   Long [employer] Limitations skin is sensitive so care putting wts on   [place] with TheraBand Red  20x                PT Education - 10/25/16 1541    Education provided Yes   Education Details Fall prevention tips   Person(s) Educated Patient   Methods Explanation;Handout   Comprehension Verbalized understanding          PT Short Term Goals - 10/11/16 1322      PT SHORT TERM GOAL #1   Title be independent in initial HEP   Time 4   Period Weeks   Status Achieved     PT SHORT TERM GOAL #2   Title verbalize and  demonstrate understanding of fall prevention   Time 4   Period Weeks   Status On-going     PT SHORT TERM GOAL #3   Title perform 5x sit to stand in < or = to 30 seconds to reduce falls risk   Time 4   Period Weeks   Status On-going     PT SHORT TERM GOAL #4   Title perform TUG in < or = to 23 seconds to reduce falls risk   Time 4   Period Weeks   Status On-going           PT Long Term Goals - 10/11/16 1324      PT LONG TERM GOAL #1   Title be independent in advanced HEP   Time 8   Period Weeks   Status On-going     PT LONG TERM GOAL #2   Title improve LE strength to demonstrate stand to sit with controlled descent   Time 8   Period Weeks   Status On-going               Plan - 10/25/16 1538    Clinical Impression Statement Pt appears more stiff with her movements today which she attributes to the weather. Upon concluding todays session pt felt and looked looser. Pt fatigued near the end of her session but was able to complete all exercises. No difficulty getting out of chair today.    Rehab Potential Good   PT Frequency 2x / week   PT Duration 8 weeks   PT Treatment/Interventions ADLs/Self Care Home Management;Functional mobility training;Stair training;Gait training;Therapeutic activities;Therapeutic exercise;Neuromuscular re-education;Patient/family education;Manual techniques;Taping   PT Next Visit Plan postural strength, endurance, gait, balance, add 1# to standing exs next   Consulted and Agree with Plan of Care Patient      Patient will benefit from skilled therapeutic intervention in order to improve the following deficits and impairments:  Abnormal gait, Decreased activity tolerance, Decreased balance, Decreased strength, Postural dysfunction, Improper body mechanics, Impaired flexibility, Decreased safety awareness, Decreased endurance, Difficulty walking  Visit Diagnosis: Other abnormalities of gait and mobility  Muscle weakness  (generalized)  Abnormal posture     Problem List Patient Active Problem List   Diagnosis Date Noted  . Anemia 11/10/2012    Class: Chronic  . Syncope 11/09/2012  . Urinary tract infection, recurrent 11/09/2012  . Hyponatremia 11/09/2012  .  Diarrhea 11/09/2012  . Paroxysmal atrial fibrillation ([place]) 11/09/2012  . Hypertension 11/09/2012  . Synovitis of knee 04/07/2012  . Postop Hypokalemia 09/23/2011  . Postop Acute blood loss anemia 09/21/2011  . Postop Transfusion 09/21/2011  . OA (osteoarthritis) of knee 09/20/2011    Fremont Skalicky, PTA 10/25/2016, 4:19 PM  [facility] [address] [place], [place], [address] Phone: [phone]   Fax:  [phone]  Name: [name] MRN: [id number] Date of Birth: 04-10-1933

## 2016-10-28 ENCOUNTER — Ambulatory Visit: Payer: Medicare Other | Attending: Internal Medicine | Admitting: Physical Therapy

## 2016-10-28 DIAGNOSIS — M6281 Muscle weakness (generalized): Secondary | ICD-10-CM | POA: Insufficient documentation

## 2016-10-28 DIAGNOSIS — R2689 Other abnormalities of gait and mobility: Secondary | ICD-10-CM | POA: Insufficient documentation

## 2016-10-28 DIAGNOSIS — R293 Abnormal posture: Secondary | ICD-10-CM | POA: Insufficient documentation

## 2016-10-29 DIAGNOSIS — N3000 Acute cystitis without hematuria: Secondary | ICD-10-CM | POA: Diagnosis not present

## 2016-11-02 ENCOUNTER — Ambulatory Visit: Payer: Medicare Other | Admitting: Physical Therapy

## 2016-11-02 DIAGNOSIS — R2689 Other abnormalities of gait and mobility: Secondary | ICD-10-CM

## 2016-11-02 DIAGNOSIS — M6281 Muscle weakness (generalized): Secondary | ICD-10-CM

## 2016-11-02 DIAGNOSIS — R293 Abnormal posture: Secondary | ICD-10-CM | POA: Diagnosis not present

## 2016-11-02 NOTE — Patient Instructions (Signed)
Derinda Bartus PT [facility] [address], [address] [place], [place] [address] Phone # [phone] Fax [phone]    

## 2016-11-02 NOTE — Therapy (Signed)
[facility] [address] [place], [place], [address] Phone: [phone]   Fax:  [phone]  Physical Therapy Treatment  Patient Details  Name: [name] MRN: [id number] Date of Birth: 07-05-1933 Referring Provider: Leanna Battles, MD  Encounter Date: 11/02/2016      PT End of Session - 11/02/16 1625    Visit Number 8   Number of Visits 18   Date for PT Re-Evaluation 12/09/16   Authorization Type G codes; KX at visit 15   PT Start Time 3154   PT Stop Time 1615   PT Time Calculation (min) 45 min   Activity Tolerance Patient tolerated treatment well      Past Medical History:  Diagnosis Date  . Arthritis    "fingers, hips, feet, ankles" (11/10/2012)  . Asthma   . Atrial fibrillation (HCC)    CHRONIC COUMADIN  . Breast cancer ([place]) 1990's   "cancer on one side; precancerous tissue on the other" (11/10/2012)  . Chronic bronchitis ([place])    "multiple times; not in a long time" (11/10/2012  . Depression   . GERD (gastroesophageal reflux disease)   . Hearing impaired   . Hypertension   . Irritable bowel   . Lymphedema    HX OF - IN LEFT ARM--NO NEEDLES OR B/P'S LEFT ARM  . Macular degeneration    "BEGINNINGS OF" MACULAR DEGENERATION  . Osteoporosis   . Pneumonia    "multiple times; not in a long time" (11/10/2012)  . Recurrent UTI   . Sleep apnea    "dx'd w/it; don't wear mask or anything" (11/10/2012)  . 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 UTI (lower urinary tract infection)    FREQUENT    Past Surgical History:  Procedure Laterality Date  . BREAST BIOPSY Bilateral 1990's  . Dacryocystorhinostomy  02/18/2016  . INNER EAR SURGERY Right    MULTIPLE EAR SURGERIES,  . JOINT REPLACEMENT    . KNEE ARTHROSCOPY  04/07/2012   Procedure: ARTHROSCOPY KNEE;  Surgeon: Gearlean Alf, MD;   Location: [facility];  Service: Orthopedics;  Laterality: Left;  WITH SYNOVECTOMY  . MASTECTOMY Bilateral 1990's  . MASTOIDECTOMY Right 1942  . TONSILLECTOMY    . TOTAL KNEE ARTHROPLASTY  09/20/2011   LEFT TOTAL KNEE ARTHROPLASTY;  Surgeon: Gearlean Alf, MD;  Location: [facility];  Service: Orthopedics;  Laterality: Left;  . TOTAL KNEE ARTHROPLASTY  2006    There were no vitals filed for this visit.      Subjective Assessment - 11/02/16 1534    Subjective No pain today.  Had a bad reaction from an antibiotic last night and couldn't sleep.  Good response to PT so far.  I think I can walk better and my husband says I'm walking faster.  Hard to bend over to pick up things from the floor or feed the cat.  Her husband states she tends to bend over from the waist rather than bending from the knees.      Pertinent History bil. TKA, breast cancer, osteoporosis   Currently in Pain? No/denies   Multiple Pain Sites No            [facility] PT Assessment - 11/02/16 0001      Strength   Overall Strength Comments Hip strength 4/5, knee strength 4+/5, ankle DF 4/5  bil.       Transfers   Five time sit to stand comments  19.3   Stand to Sit With upper extremity assist;5: Supervision     Timed Up and Go Test   TUG Normal TUG   Normal TUG (seconds) 18   TUG Comments no cane                     [facility] Adult PT Treatment/Exercise - 11/02/16 0001      Therapeutic Activites    Therapeutic Activities Other Therapeutic Activities   Other Therapeutic Activities partial squatting; standing with upright posture     Knee/Hip Exercises: Stretches   Active Hamstring Stretch Right;Left;3 reps;30 seconds   Active Hamstring Stretch Limitations seated     Knee/Hip Exercises: Aerobic   Nustep L1 x 8 min    discussed status     Knee/Hip Exercises: Standing   Heel Raises Both;1 set;10 reps   Knee Flexion AROM;Strengthening;Both;10 reps   Knee Flexion Limitations 1#   Hip  Abduction Stengthening;Right;Left;10 reps   Abduction Limitations 1#   Hip Extension Stengthening;Right;Left;10 reps   Extension Limitations 1#   Functional Squat 5 reps  with single arm reach toward floor   Other Standing Knee Exercises marching 1# 10x     Knee/Hip Exercises: Seated   Long Arc Quad --   Long Arc Quad [employer] --   Ball Squeeze 25x   Clamshell with TheraBand Red  20x     Shoulder Exercises: Seated   Extension Strengthening;Both;10 reps;Theraband   Theraband Level (Shoulder Extension) Level 2 (Red)   Row Strengthening;Both;15 reps;Theraband   Theraband Level (Shoulder Row) Level 2 (Red)                PT Education - 11/02/16 1612    Education provided Yes   Education Details seated clams   Person(s) Educated Patient   Methods Explanation;Demonstration;Handout   Comprehension Verbalized understanding;Returned demonstration          PT Short Term Goals - 11/02/16 1551      PT SHORT TERM GOAL #1   Title be independent in initial HEP   Status Achieved     PT SHORT TERM GOAL #2   Title verbalize and demonstrate understanding of fall prevention   Status Achieved     PT SHORT TERM GOAL #3   Title perform 5x sit to stand in < or = to 30 seconds to reduce falls risk   Status Achieved     PT SHORT TERM GOAL #4   Title perform TUG in < or = to 23 seconds to reduce falls risk   Status Achieved           PT Long Term Goals - 11/02/16 1551      PT LONG TERM GOAL #1   Title be independent in advanced HEP   Time 8   Period Weeks   Status On-going     PT LONG TERM GOAL #2   Title improve LE strength to demonstrate stand to sit with controlled descent   Time 8   Period Weeks   Status On-going     PT LONG TERM GOAL #3   Title perform 5x sit to stand in < or = to 25 seconds   Time 8   Period Weeks   Status On-going     PT LONG TERM GOAL #4   Title perform TUG in < or = to 20 seconds to reduce falls risk  Time 8   Period Weeks    Status On-going               Plan - 11-12-16 1625    Clinical Impression Statement Despite patient's reports of a poor night's sleep, she has made good progress with STGs with significant improvements in Timed up and Go test and sit to stand test.  She is able to perform a progression of standing strengthening exercises with a 1# weight.  Minimal cues only for upright posture.  Reports good complaince with HEP.   Rehab Potential Good   PT Duration 8 weeks   PT Treatment/Interventions ADLs/Self Care Home Management;Functional mobility training;Stair training;Gait training;Therapeutic activities;Therapeutic exercise;Neuromuscular re-education;Patient/family education;Manual techniques;Taping   PT Next Visit Plan postural strength, endurance, gait, balance, continue 1# to standing exs;  add red band rows and extensions to HEP;  partial squatting;  G codes applied early (8th visit)   Recommended Other Services rerouted cert to MD 2nd attempt for signature      Patient will benefit from skilled therapeutic intervention in order to improve the following deficits and impairments:  Abnormal gait, Decreased activity tolerance, Decreased balance, Decreased strength, Postural dysfunction, Improper body mechanics, Impaired flexibility, Decreased safety awareness, Decreased endurance, Difficulty walking  Visit Diagnosis: Other abnormalities of gait and mobility  Muscle weakness (generalized)  Abnormal posture       G-Codes - November 12, 2016 1632    Functional Assessment Tool Used (Outpatient Only) 5x sit to stand, TUG    Mobility: Walking and Moving Around Current Status (N8676) At least 60 percent but less than 80 percent impaired, limited or restricted   Mobility: Walking and Moving Around Goal Status [phone]) At least 40 percent but less than 60 percent impaired, limited or restricted      Problem List Patient Active Problem List   Diagnosis Date Noted  . Anemia 11/10/2012    Class:  Chronic  . Syncope 11/09/2012  . Urinary tract infection, recurrent 11/09/2012  . Hyponatremia 11/09/2012  . Diarrhea 11/09/2012  . Paroxysmal atrial fibrillation ([place]) 11/09/2012  . Hypertension 11/09/2012  . Synovitis of knee 04/07/2012  . Postop Hypokalemia 09/23/2011  . Postop Acute blood loss anemia 09/21/2011  . Postop Transfusion 09/21/2011  . OA (osteoarthritis) of knee 09/20/2011   Ruben Im, PT 2016-11-12 4:34 PM Phone: [phone] Fax: [phone]  Alvera Singh 2016-11-12, 4:33 PM  [facility] [address] [place], [place], [address] Phone: [phone]   Fax:  [phone]  Name: Lorely Bubb MRN: [id number] Date of Birth: 1933-04-27

## 2016-11-05 ENCOUNTER — Ambulatory Visit: Payer: Medicare Other | Admitting: Physical Therapy

## 2016-11-05 ENCOUNTER — Encounter: Payer: Self-pay | Admitting: Physical Therapy

## 2016-11-05 DIAGNOSIS — R2689 Other abnormalities of gait and mobility: Secondary | ICD-10-CM

## 2016-11-05 DIAGNOSIS — R293 Abnormal posture: Secondary | ICD-10-CM | POA: Diagnosis not present

## 2016-11-05 DIAGNOSIS — M6281 Muscle weakness (generalized): Secondary | ICD-10-CM

## 2016-11-05 NOTE — Therapy (Signed)
[facility] [address] [place], [place], [address] Phone: [phone]   Fax:  [phone]  Physical Therapy Treatment  Patient Details  Name: [name] MRN: [id number] Date of Birth: 1933-08-20 Referring Provider: Leanna Battles, MD  Encounter Date: 11/05/2016      PT End of Session - 11/05/16 1108    Visit Number 9   Number of Visits 18   Date for PT Re-Evaluation 2016-12-03   Authorization Type G codes; KX at visit 15   PT Start Time 1108   PT Stop Time 1150   PT Time Calculation (min) 42 min   Activity Tolerance Patient tolerated treatment well   Behavior During Therapy [facility] for tasks assessed/performed      Past Medical History:  Diagnosis Date  . Arthritis    "fingers, hips, feet, ankles" (11/10/2012)  . Asthma   . Atrial fibrillation (HCC)    CHRONIC COUMADIN  . Breast cancer ([place]) 1990's   "cancer on one side; precancerous tissue on the other" (11/10/2012)  . Chronic bronchitis ([place])    "multiple times; not in a long time" (11/10/2012  . Depression   . GERD (gastroesophageal reflux disease)   . Hearing impaired   . Hypertension   . Irritable bowel   . Lymphedema    HX OF - IN LEFT ARM--NO NEEDLES OR B/P'S LEFT ARM  . Macular degeneration    "BEGINNINGS OF" MACULAR DEGENERATION  . Osteoporosis   . Pneumonia    "multiple times; not in a long time" (11/10/2012)  . Recurrent UTI   . Sleep apnea    "dx'd w/it; don't wear mask or anything" (11/10/2012)  . Stroke ([place]) ~ 2005   HX OF TIA-NO RESIDUAL PROBLEM--PARALYSIS RT SIDE FACE /PT'S MOUTH DROOPS-AND LOSS OF HEARING RT EAR --SINCE EAR SURGERY AS A CHILD   . UTI (lower urinary tract infection)    FREQUENT    Past Surgical History:  Procedure Laterality Date  . BREAST BIOPSY Bilateral 1990's  . Dacryocystorhinostomy  02/18/2016  . INNER EAR SURGERY Right    MULTIPLE EAR SURGERIES,  . JOINT REPLACEMENT    . KNEE ARTHROSCOPY  04/07/2012   Procedure: ARTHROSCOPY KNEE;  Surgeon: Gearlean Alf, MD;  Location: [facility];  Service: Orthopedics;  Laterality: Left;  WITH SYNOVECTOMY  . MASTECTOMY Bilateral 1990's  . MASTOIDECTOMY Right 1942  . TONSILLECTOMY    . TOTAL KNEE ARTHROPLASTY  09/20/2011   LEFT TOTAL KNEE ARTHROPLASTY;  Surgeon: Gearlean Alf, MD;  Location[facility];  Service: Orthopedics;  Laterality: Left;  . TOTAL KNEE ARTHROPLASTY  2006    There were no vitals filed for this visit.      Subjective Assessment - 11/05/16 1110    Subjective No pain today. I got weights for home but they are difficult to use.    Currently in Pain? No/denies                         [facility] Adult PT Treatment/Exercise - 11/05/16 0001      Knee/Hip Exercises: Aerobic   Nustep L2 x 8 min    discussed status     Knee/Hip Exercises: Standing   Heel Raises Both;2 sets;10 reps   Knee Flexion AROM;Strengthening;Both;2 sets;15 reps   Knee Flexion Limitations 1#   Hip Abduction AROM;Stengthening;Both;1 set;15 reps;Knee straight   Abduction Limitations 1#   Hip Extension AROM;Stengthening;Both;1 set;15 reps;Knee straight   Extension Limitations  1#   Other Standing Knee Exercises marching 1# 15x     Knee/Hip Exercises: Seated   Ball Squeeze --  2 x10 with trunk extension ( small ) VC to sit taller   Clamshell with TheraBand Green  20x     Shoulder Exercises: Seated   Extension Strengthening;Both;10 reps;Theraband   Theraband Level (Shoulder Extension) Level 3 (Green)   Row Strengthening;Both;10 reps;Theraband   Theraband Level (Shoulder Row) Level 3 Nyoka Cowden)                PT Education - 11/05/16 1125    Education Details progress to green band    Person(s) Educated Patient   Methods Demonstration   Comprehension Returned demonstration          PT Short Term Goals - 11/02/16 1551      PT SHORT TERM GOAL #1   Title be independent in initial HEP   Status Achieved     PT SHORT  TERM GOAL #2   Title verbalize and demonstrate understanding of fall prevention   Status Achieved     PT SHORT TERM GOAL #3   Title perform 5x sit to stand in < or = to 30 seconds to reduce falls risk   Status Achieved     PT SHORT TERM GOAL #4   Title perform TUG in < or = to 23 seconds to reduce falls risk   Status Achieved           PT Long Term Goals - 11/02/16 1551      PT LONG TERM GOAL #1   Title be independent in advanced HEP   Time 8   Period Weeks   Status On-going     PT LONG TERM GOAL #2   Title improve LE strength to demonstrate stand to sit with controlled descent   Time 8   Period Weeks   Status On-going     PT LONG TERM GOAL #3   Title perform 5x sit to stand in < or = to 25 seconds   Time 8   Period Weeks   Status On-going     PT LONG TERM GOAL #4   Title perform TUG in < or = to 20 seconds to reduce falls risk   Time 8   Period Weeks   Status On-going               Plan - 11/05/16 1109    Clinical Impression Statement Increased reps today which pt reported she was already doing at home. She has difficulty getting her eights on her ankles at home so she continues to do her HEP but without her weights. Pt ambulated well today, getting up out of her chair with ease. We progressed her band to green for home rows/extensions after reports the red was too easy.    Rehab Potential Good   PT Frequency 2x / week   PT Duration 8 weeks   PT Treatment/Interventions ADLs/Self Care Home Management;Functional mobility training;Stair training;Gait training;Therapeutic activities;Therapeutic exercise;Neuromuscular re-education;Patient/family education;Manual techniques;Taping   PT Next Visit Plan postural strength, endurance, gait, balance, continue 1# to standing exs;  add red band rows and extensions to HEP;  partial squatting;  G codes applied early (8th visit)   Consulted and Agree with Plan of Care Patient      Patient will benefit from skilled  therapeutic intervention in order to improve the following deficits and impairments:  Abnormal gait, Decreased activity tolerance, Decreased balance, Decreased strength, Postural  dysfunction, Improper body mechanics, Impaired flexibility, Decreased safety awareness, Decreased endurance, Difficulty walking  Visit Diagnosis: Other abnormalities of gait and mobility  Muscle weakness (generalized)  Abnormal posture     Problem List Patient Active Problem List   Diagnosis Date Noted  . Anemia 11/10/2012    Class: Chronic  . Syncope 11/09/2012  . Urinary tract infection, recurrent 11/09/2012  . Hyponatremia 11/09/2012  . Diarrhea 11/09/2012  . Paroxysmal atrial fibrillation ([place]) 11/09/2012  . Hypertension 11/09/2012  . Synovitis of knee 04/07/2012  . Postop Hypokalemia 09/23/2011  . Postop Acute blood loss anemia 09/21/2011  . Postop Transfusion 09/21/2011  . OA (osteoarthritis) of knee 09/20/2011    Gladie Gravette, PTA 11/05/2016, 11:54 AM  [facility] [address] [place], [place], [address] Phone: [phone]   Fax:  [phone]  Name: Kavina Cantave MRN: [id number] Date of Birth: 10-Oct-1933

## 2016-11-08 ENCOUNTER — Encounter: Payer: Self-pay | Admitting: Physical Therapy

## 2016-11-08 ENCOUNTER — Ambulatory Visit: Payer: Medicare Other | Admitting: Physical Therapy

## 2016-11-08 DIAGNOSIS — R293 Abnormal posture: Secondary | ICD-10-CM

## 2016-11-08 DIAGNOSIS — M6281 Muscle weakness (generalized): Secondary | ICD-10-CM

## 2016-11-08 DIAGNOSIS — R2689 Other abnormalities of gait and mobility: Secondary | ICD-10-CM | POA: Diagnosis not present

## 2016-11-08 NOTE — Therapy (Signed)
[facility] [address] [place], [place], [address] Phone: [phone]   Fax:  [phone]  Physical Therapy Treatment  Patient Details  Name: [name] MRN: [id number] Date of Birth: 10-20-1933 Referring Provider: Leanna Battles, MD  Encounter Date: 11/08/2016      PT End of Session - 11/08/16 1446    Visit Number 10   Number of Visits 18   Date for PT Re-Evaluation 12-20-16   Authorization Type G codes; KX at visit 15   PT Start Time 1442   PT Stop Time 1525   PT Time Calculation (min) 43 min   Activity Tolerance Patient tolerated treatment well;Patient limited by fatigue;Patient limited by lethargy   Behavior During Therapy [facility] for tasks assessed/performed      Past Medical History:  Diagnosis Date  . Arthritis    "fingers, hips, feet, ankles" (11/10/2012)  . Asthma   . Atrial fibrillation (HCC)    CHRONIC COUMADIN  . Breast cancer ([place]) 1990's   "cancer on one side; precancerous tissue on the other" (11/10/2012)  . Chronic bronchitis ([place])    "multiple times; not in a long time" (11/10/2012  . Depression   . GERD (gastroesophageal reflux disease)   . Hearing impaired   . Hypertension   . Irritable bowel   . Lymphedema    HX OF - IN LEFT ARM--NO NEEDLES OR B/P'S LEFT ARM  . Macular degeneration    "BEGINNINGS OF" MACULAR DEGENERATION  . Osteoporosis   . Pneumonia    "multiple times; not in a long time" (11/10/2012)  . Recurrent UTI   . Sleep apnea    "dx'd w/it; don't wear mask or anything" (11/10/2012)  . Stroke ([place]) ~ 2005   HX OF TIA-NO RESIDUAL PROBLEM--PARALYSIS RT SIDE FACE /PT'S MOUTH DROOPS-AND LOSS OF HEARING RT EAR --SINCE EAR SURGERY AS A CHILD   . UTI (lower urinary tract infection)    FREQUENT    Past Surgical History:  Procedure Laterality Date  . BREAST BIOPSY Bilateral 1990's  . Dacryocystorhinostomy  02/18/2016  . INNER EAR SURGERY Right    MULTIPLE EAR SURGERIES,  .  JOINT REPLACEMENT    . KNEE ARTHROSCOPY  04/07/2012   Procedure: ARTHROSCOPY KNEE;  Surgeon: Gearlean Alf, MD;  Location: [facility];  Service: Orthopedics;  Laterality: Left;  WITH SYNOVECTOMY  . MASTECTOMY Bilateral 1990's  . MASTOIDECTOMY Right 1942  . TONSILLECTOMY    . TOTAL KNEE ARTHROPLASTY  09/20/2011   LEFT TOTAL KNEE ARTHROPLASTY;  Surgeon: Gearlean Alf, MD;  [facility];  Service: Orthopedics;  Laterality: Left;  . TOTAL KNEE ARTHROPLASTY  2006    There were no vitals filed for this visit.      Subjective Assessment - 11/08/16 1448    Subjective Exercises stir up my back pain.    Pertinent History bil. TKA, breast cancer, osteoporosis   Limitations Standing;Walking   How long can you stand comfortably? fatigue   Currently in Pain? No/denies   Multiple Pain Sites No                         [facility] Adult PT Treatment/Exercise - 11/08/16 0001      Knee/Hip Exercises: Standing   Heel Raises Both;2 sets;10 reps   Knee Flexion AROM;Strengthening;Both;2 sets;15 reps   Knee Flexion Limitations 1#   Hip Abduction AROM;Stengthening;Both;1 set;15 reps;Knee straight   Abduction Limitations 1#   Hip Extension  AROM;Stengthening;Both;1 set;15 reps;Knee straight   Extension Limitations 1#   Other Standing Knee Exercises marching 1# 15x     Knee/Hip Exercises: Seated   Long Arc Quad Strengthening;Both;1 set;10 reps;Weights   Long Arc Quad Weight 4 lbs.   Clamshell with TheraBand Green  20x                  PT Short Term Goals - 11/02/16 1551      PT SHORT TERM GOAL #1   Title be independent in initial HEP   Status Achieved     PT SHORT TERM GOAL #2   Title verbalize and demonstrate understanding of fall prevention   Status Achieved     PT SHORT TERM GOAL #3   Title perform 5x sit to stand in < or = to 30 seconds to reduce falls risk   Status Achieved     PT SHORT TERM GOAL #4   Title perform TUG in < or = to 23  seconds to reduce falls risk   Status Achieved           PT Long Term Goals - 11/02/16 1551      PT LONG TERM GOAL #1   Title be independent in advanced HEP   Time 8   Period Weeks   Status On-going     PT LONG TERM GOAL #2   Title improve LE strength to demonstrate stand to sit with controlled descent   Time 8   Period Weeks   Status On-going     PT LONG TERM GOAL #3   Title perform 5x sit to stand in < or = to 25 seconds   Time 8   Period Weeks   Status On-going     PT LONG TERM GOAL #4   Title perform TUG in < or = to 20 seconds to reduce falls risk   Time 8   Period Weeks   Status On-going               Plan - 11/08/16 1447    Clinical Impression Statement All short term goals met this week. Attempted to perform consecutive standing exercises today to work on standing endurance. Pt could  alomst complete 3 exercises before needing to sit.  Pt was extra fatigued today as she washed her bathroom floor this afternoon.  She was frequently out of breath near the end of each exercise.    Rehab Potential Good   PT Frequency 2x / week   PT Duration 8 weeks   PT Treatment/Interventions ADLs/Self Care Home Management;Functional mobility training;Stair training;Gait training;Therapeutic activities;Therapeutic exercise;Neuromuscular re-education;Patient/family education;Manual techniques;Taping   PT Next Visit Plan Strength, balance, gait velocity   Consulted and Agree with Plan of Care Patient      Patient will benefit from skilled therapeutic intervention in order to improve the following deficits and impairments:  Abnormal gait, Decreased activity tolerance, Decreased balance, Decreased strength, Postural dysfunction, Improper body mechanics, Impaired flexibility, Decreased safety awareness, Decreased endurance, Difficulty walking  Visit Diagnosis: Other abnormalities of gait and mobility  Muscle weakness (generalized)  Abnormal posture     Problem  List Patient Active Problem List   Diagnosis Date Noted  . Anemia 11/10/2012    Class: Chronic  . Syncope 11/09/2012  . Urinary tract infection, recurrent 11/09/2012  . Hyponatremia 11/09/2012  . Diarrhea 11/09/2012  . Paroxysmal atrial fibrillation ([place]) 11/09/2012  . Hypertension 11/09/2012  . Synovitis of knee 04/07/2012  . Postop Hypokalemia 09/23/2011  .  Postop Acute blood loss anemia 09/21/2011  . Postop Transfusion 09/21/2011  . OA (osteoarthritis) of knee 09/20/2011    Rushville Cohick, PTA 11/08/2016, 3:32 PM  [facility] [address] [place], [place], [address] Phone: [phone]   Fax:  [phone]  Name: Ambreen Tufte MRN: [id number] Date of Birth: Jan 12, 1934

## 2016-11-09 DIAGNOSIS — Z7901 Long term (current) use of anticoagulants: Secondary | ICD-10-CM | POA: Diagnosis not present

## 2016-11-09 DIAGNOSIS — I480 Paroxysmal atrial fibrillation: Secondary | ICD-10-CM | POA: Diagnosis not present

## 2016-11-11 ENCOUNTER — Ambulatory Visit: Payer: Medicare Other | Admitting: Physical Therapy

## 2016-11-11 DIAGNOSIS — R2689 Other abnormalities of gait and mobility: Secondary | ICD-10-CM

## 2016-11-11 DIAGNOSIS — R293 Abnormal posture: Secondary | ICD-10-CM

## 2016-11-11 DIAGNOSIS — M6281 Muscle weakness (generalized): Secondary | ICD-10-CM

## 2016-11-11 NOTE — Therapy (Signed)
[facility] [address], [place], [place], [address] Phone: [phone]   Fax:  [phone]  Physical Therapy Treatment  Patient Details  Name: [name] MRN: [id number] Date of Birth: 10/23/1933 Referring Provider: Leanna Battles, MD  Encounter Date: 11/11/2016      PT End of Session - 11/11/16 1546    Visit Number 11   Number of Visits 18   Date for PT Re-Evaluation November 29, 2016   Authorization Type G codes; KX at visit 15   PT Start Time 1532   PT Stop Time 1615   PT Time Calculation (min) 43 min   Activity Tolerance Patient tolerated treatment well      Past Medical History:  Diagnosis Date  . Arthritis    "fingers, hips, feet, ankles" (11/10/2012)  . Asthma   . Atrial fibrillation (HCC)    CHRONIC COUMADIN  . Breast cancer ([place]) 1990's   "cancer on one side; precancerous tissue on the other" (11/10/2012)  . Chronic bronchitis ([place])    "multiple times; not in a long time" (11/10/2012  . Depression   . GERD (gastroesophageal reflux disease)   . Hearing impaired   . Hypertension   . Irritable bowel   . Lymphedema    HX OF - IN LEFT ARM--NO NEEDLES OR B/P'S LEFT ARM  . Macular degeneration    "BEGINNINGS OF" MACULAR DEGENERATION  . Osteoporosis   . Pneumonia    "multiple times; not in a long time" (11/10/2012)  . Recurrent UTI   . Sleep apnea    "dx'd w/it; don't wear mask or anything" (11/10/2012)  . Stroke ([place]) ~ 2005   HX OF TIA-NO RESIDUAL PROBLEM--PARALYSIS RT SIDE FACE /PT'S MOUTH DROOPS-AND LOSS OF HEARING RT EAR --SINCE EAR SURGERY AS A CHILD   . UTI (lower urinary tract infection)    FREQUENT    Past Surgical History:  Procedure Laterality Date  . BREAST BIOPSY Bilateral 1990's  . Dacryocystorhinostomy  02/18/2016  . INNER EAR SURGERY Right    MULTIPLE EAR SURGERIES,  . JOINT REPLACEMENT    . KNEE ARTHROSCOPY  04/07/2012   Procedure: ARTHROSCOPY KNEE;  Surgeon: Gearlean Alf, MD;   Location: [facility];  Service: Orthopedics;  Laterality: Left;  WITH SYNOVECTOMY  . MASTECTOMY Bilateral 1990's  . MASTOIDECTOMY Right 1942  . TONSILLECTOMY    . TOTAL KNEE ARTHROPLASTY  09/20/2011   LEFT TOTAL KNEE ARTHROPLASTY;  Surgeon: Gearlean Alf, MD;  Location: [facility];  Service: Orthopedics;  Laterality: Left;  . TOTAL KNEE ARTHROPLASTY  2006    There were no vitals filed for this visit.      Subjective Assessment - 11/11/16 1539    Subjective Went to lunch with a friend.  Sometimes my back flares up.  I get up and down faster and I walk faster.  Patient's husband comes back from the waiting room to say, "She needs to work on squatting."     Currently in Pain? No/denies   Multiple Pain Sites No                         [facility] Adult PT Treatment/Exercise - 11/11/16 0001      Therapeutic Activites    Other Therapeutic Activities partial squatting; standing with upright posture     Neuro Re-ed    Neuro Re-ed Details  dynamic balance reaching in different planes     Knee/Hip Exercises: Aerobic  Nustep L1 11 min     Knee/Hip Exercises: Standing   Heel Raises Both;2 sets;10 reps   Hip Abduction AROM;Stengthening;Both;1 set;15 reps;Knee straight   Abduction Limitations 1#   Hip Extension AROM;Stengthening;Both;1 set;15 reps;Knee straight   Extension Limitations 1#   Functional Squat 5 reps  with single arm reach toward floor   Gait Training heel touch forward for stepping down off curb 5x each leg    Other Standing Knee Exercises marching 1# 15x   Other Standing Knee Exercises squatting for cones on step stool and to the floor 2x 5  one arm hold on the chair      Knee/Hip Exercises: Seated   Long Arc Quad Strengthening;Right;Left;10 reps   Long Arc Quad Limitations red band   [facility] --  2 x10 with trunk extension ( small ) VC to sit taller   Clamshell with TheraBand Green  20x   Knee/Hip Flexion red band 10x bil                 PT Education - 11/11/16 2312    Education provided Yes   Education Details seated band ex   Person(s) Educated Patient   Methods Explanation;Demonstration;Handout   Comprehension Verbalized understanding;Returned demonstration          PT Short Term Goals - 11/11/16 2316      PT SHORT TERM GOAL #1   Title be independent in initial HEP   Status Achieved     PT SHORT TERM GOAL #2   Title verbalize and demonstrate understanding of fall prevention   Status Achieved     PT SHORT TERM GOAL #3   Title perform 5x sit to stand in < or = to 30 seconds to reduce falls risk   Status Achieved     PT SHORT TERM GOAL #4   Title perform TUG in < or = to 23 seconds to reduce falls risk   Status Achieved           PT Long Term Goals - 11/11/16 2316      PT LONG TERM GOAL #1   Title be independent in advanced HEP   Time 8   Period Weeks   Status On-going     PT LONG TERM GOAL #2   Title improve LE strength to demonstrate stand to sit with controlled descent   Time 8   Period Weeks   Status On-going     PT LONG TERM GOAL #3   Title perform 5x sit to stand in < or = to 25 seconds   Time 8   Period Weeks   Status On-going     PT LONG TERM GOAL #4   Title perform TUG in < or = to 20 seconds to reduce falls risk   Time 8   Period Weeks   Status On-going               Plan - 11/11/16 2312    Clinical Impression Statement The patient reports functional improvements with ability to rise from a chair and gait speed.  Her husband points out her difficulties picking up objects from floor and she states she is not confident with stepping down off a curb.  Close supervision for safety with reaching and squatting tasks.     Rehab Potential Good   PT Frequency 2x / week   PT Duration 8 weeks   PT Treatment/Interventions ADLs/Self Care Home Management;Functional mobility training;Stair training;Gait training;Therapeutic activities;Therapeutic  exercise;Neuromuscular re-education;Patient/family  education;Manual techniques;Taping   PT Next Visit Plan Strength, balance, gait velocity;  squatting;  step downs from a curb      Patient will benefit from skilled therapeutic intervention in order to improve the following deficits and impairments:  Abnormal gait, Decreased activity tolerance, Decreased balance, Decreased strength, Postural dysfunction, Improper body mechanics, Impaired flexibility, Decreased safety awareness, Decreased endurance, Difficulty walking  Visit Diagnosis: Other abnormalities of gait and mobility  Muscle weakness (generalized)  Abnormal posture     Problem List Patient Active Problem List   Diagnosis Date Noted  . Anemia 11/10/2012    Class: Chronic  . Syncope 11/09/2012  . Urinary tract infection, recurrent 11/09/2012  . Hyponatremia 11/09/2012  . Diarrhea 11/09/2012  . Paroxysmal atrial fibrillation ([place]) 11/09/2012  . Hypertension 11/09/2012  . Synovitis of knee 04/07/2012  . Postop Hypokalemia 09/23/2011  . Postop Acute blood loss anemia 09/21/2011  . Postop Transfusion 09/21/2011  . OA (osteoarthritis) of knee 09/20/2011   Ruben Im, PT 11/11/16 11:19 PM Phone: [phone] Fax: [phone]  Alvera Singh 11/11/2016, 11:19 PM  [facility] [address] [place], [place], [address] Phone: [phone]   Fax:  [phone]  Name: Ayushi Pla MRN: [id number] Date of Birth: November 23, 1933

## 2016-11-11 NOTE — Patient Instructions (Signed)
Stacy Simpson PT [facility] [address], [address] [place], [place] [address] Phone # [phone] Fax [phone]    

## 2016-11-16 ENCOUNTER — Ambulatory Visit: Payer: Medicare Other | Admitting: Physical Therapy

## 2016-11-16 DIAGNOSIS — M6281 Muscle weakness (generalized): Secondary | ICD-10-CM | POA: Diagnosis not present

## 2016-11-16 DIAGNOSIS — R293 Abnormal posture: Secondary | ICD-10-CM

## 2016-11-16 DIAGNOSIS — R8290 Unspecified abnormal findings in urine: Secondary | ICD-10-CM | POA: Diagnosis not present

## 2016-11-16 DIAGNOSIS — R2689 Other abnormalities of gait and mobility: Secondary | ICD-10-CM | POA: Diagnosis not present

## 2016-11-16 DIAGNOSIS — R197 Diarrhea, unspecified: Secondary | ICD-10-CM | POA: Diagnosis not present

## 2016-11-16 DIAGNOSIS — N390 Urinary tract infection, site not specified: Secondary | ICD-10-CM | POA: Diagnosis not present

## 2016-11-16 NOTE — Therapy (Signed)
[facility] [address], [place], [place], [address] Phone: [phone]   Fax:  [phone]  Physical Therapy Treatment  Patient Details  Name: [name] MRN: [id number] Date of Birth: 1933/11/03 Referring Provider: Leanna Battles, MD  Encounter Date: 11/16/2016      PT End of Session - 11/16/16 1418    Visit Number 12   Number of Visits 18   Date for PT Re-Evaluation 11/22/16   Authorization Type G code at visit 18;; KX at visit 15   PT Start Time 1356   PT Stop Time 1434  pt needs to leave early for a dr appt   PT Time Calculation (min) 38 min   Activity Tolerance Patient tolerated treatment well      Past Medical History:  Diagnosis Date  . Arthritis    "fingers, hips, feet, ankles" (11/10/2012)  . Asthma   . Atrial fibrillation (HCC)    CHRONIC COUMADIN  . Breast cancer ([place]) 1990's   "cancer on one side; precancerous tissue on the other" (11/10/2012)  . Chronic bronchitis ([place])    "multiple times; not in a long time" (11/10/2012  . Depression   . GERD (gastroesophageal reflux disease)   . Hearing impaired   . Hypertension   . Irritable bowel   . Lymphedema    HX OF - IN LEFT ARM--NO NEEDLES OR B/P'S LEFT ARM  . Macular degeneration    "BEGINNINGS OF" MACULAR DEGENERATION  . Osteoporosis   . Pneumonia    "multiple times; not in a long time" (11/10/2012)  . Recurrent UTI   . Sleep apnea    "dx'd w/it; don't wear mask or anything" (11/10/2012)  . Stroke ([place]) ~ 2005   HX OF TIA-NO RESIDUAL PROBLEM--PARALYSIS RT SIDE FACE /PT'S MOUTH DROOPS-AND LOSS OF HEARING RT EAR --SINCE EAR SURGERY AS A CHILD   . UTI (lower urinary tract infection)    FREQUENT    Past Surgical History:  Procedure Laterality Date  . BREAST BIOPSY Bilateral 1990's  . Dacryocystorhinostomy  02/18/2016  . INNER EAR SURGERY Right    MULTIPLE EAR SURGERIES,  . JOINT REPLACEMENT    . KNEE ARTHROSCOPY  04/07/2012   Procedure: ARTHROSCOPY KNEE;  Surgeon: Gearlean Alf, MD;  Location[facility];  Service: Orthopedics;  Laterality: Left;  WITH SYNOVECTOMY  . MASTECTOMY Bilateral 1990's  . MASTOIDECTOMY Right 1942  . TONSILLECTOMY    . TOTAL KNEE ARTHROPLASTY  09/20/2011   LEFT TOTAL KNEE ARTHROPLASTY;  Surgeon: Gearlean Alf, MD;  Location: [facility];  Service: Orthopedics;  Laterality: Left;  . TOTAL KNEE ARTHROPLASTY  2006    There were no vitals filed for this visit.      Subjective Assessment - 11/16/16 1400    Subjective No pain, just general achiness.  Patient/husband states they need to leave early to make a doctor's appt across town.  I liked working on squatting and stretching my hamstring.     Currently in Pain? No/denies   Multiple Pain Sites No                         [facility] Adult PT Treatment/Exercise - 11/16/16 0001      Therapeutic Activites    Other Therapeutic Activities partial squatting; standing with upright posture; stairs, curbs     Neuro Re-ed    Neuro Re-ed Details  dynamic balance reaching in different planes     Knee/Hip  Exercises: Stretches   Active Hamstring Stretch Right;Left;3 reps;30 seconds   Active Hamstring Stretch Limitations on 2nd step   Hip Flexor Stretch Limitations 5x on 2nd step     Knee/Hip Exercises: Aerobic   Nustep L1 6 min     Knee/Hip Exercises: Standing   Heel Raises Both;2 sets;10 reps   Knee Flexion AROM;Strengthening;Both;2 sets;15 reps   Knee Flexion Limitations 2#   Hip Abduction AROM;Stengthening;Both;1 set;15 reps;Knee straight   Abduction Limitations 2#   Hip Extension Stengthening;Right;Left;10 reps   Extension Limitations red band   Forward Step Up Right;Left;1 set;10 reps;Hand Hold: 2;Step Height: 6"   Functional Squat 5 reps  with single arm reach to steps   Stairs 1x with 2 rails reciprocal pattern   Gait Training heel touch forward for stepping down off curb 5x each leg    Other Standing  Knee Exercises reaching 5x each leg   Other Standing Knee Exercises weight shifting side to side      Knee/Hip Exercises: Seated   Long Arc Quad Strengthening;Right;Left;10 reps   Long Arc Quad Weight 2 lbs.   Clamshell with TheraBand Green  20x                  PT Short Term Goals - 11/16/16 1450      PT SHORT TERM GOAL #1   Title be independent in initial HEP   Status Achieved     PT SHORT TERM GOAL #2   Title verbalize and demonstrate understanding of fall prevention   Status Achieved     PT SHORT TERM GOAL #3   Title perform 5x sit to stand in < or = to 30 seconds to reduce falls risk   Status Achieved     PT SHORT TERM GOAL #4   Title perform TUG in < or = to 23 seconds to reduce falls risk   Status Achieved           PT Long Term Goals - 11/16/16 1524      PT LONG TERM GOAL #1   Title be independent in advanced HEP   Time 8   Period Weeks   Status On-going     PT LONG TERM GOAL #2   Title improve LE strength to demonstrate stand to sit with controlled descent   Time 8   Period Weeks   Status On-going     PT LONG TERM GOAL #3   Title perform 5x sit to stand in < or = to 25 seconds   Time 8   Period Weeks   Status On-going     PT LONG TERM GOAL #4   Title perform TUG in < or = to 20 seconds to reduce falls risk   Time 8   Period Weeks   Status On-going               Plan - 11/16/16 1435    Clinical Impression Statement The patient reports good compliance with home ex program.  Needs fewer sitting rest breaks between standing exercises.  Able to squat with reach small item with right hand fairly easily but left handed reach more difficult.  Therapist closely monitoring    Rehab Potential Good   PT Frequency 2x / week   PT Duration 8 weeks   PT Treatment/Interventions ADLs/Self Care Home Management;Functional mobility training;Stair training;Gait training;Therapeutic activities;Therapeutic exercise;Neuromuscular  re-education;Patient/family education;Manual techniques;Taping   PT Next Visit Plan Strength, balance, gait velocity;  squatting;  step downs from  a curb      Patient will benefit from skilled therapeutic intervention in order to improve the following deficits and impairments:  Abnormal gait, Decreased activity tolerance, Decreased balance, Decreased strength, Postural dysfunction, Improper body mechanics, Impaired flexibility, Decreased safety awareness, Decreased endurance, Difficulty walking  Visit Diagnosis: Other abnormalities of gait and mobility  Muscle weakness (generalized)  Abnormal posture     Problem List Patient Active Problem List   Diagnosis Date Noted  . Anemia 11/10/2012    Class: Chronic  . Syncope 11/09/2012  . Urinary tract infection, recurrent 11/09/2012  . Hyponatremia 11/09/2012  . Diarrhea 11/09/2012  . Paroxysmal atrial fibrillation ([place]) 11/09/2012  . Hypertension 11/09/2012  . Synovitis of knee 04/07/2012  . Postop Hypokalemia 09/23/2011  . Postop Acute blood loss anemia 09/21/2011  . Postop Transfusion 09/21/2011  . OA (osteoarthritis) of knee 09/20/2011   Ruben Im, PT 11/16/16 3:25 PM Phone: [phone] Fax: [phone]  Alvera Singh 11/16/2016, 3:25 PM  [facility] [address], [place], [place], [address] Phone: [phone]   Fax:  [phone]  Name: [name] MRN: [id number] Date of Birth: [birth year]

## 2016-11-17 DIAGNOSIS — L814 Other melanin hyperpigmentation: Secondary | ICD-10-CM | POA: Diagnosis not present

## 2016-11-17 DIAGNOSIS — Z85828 Personal history of other malignant neoplasm of skin: Secondary | ICD-10-CM | POA: Diagnosis not present

## 2016-11-17 DIAGNOSIS — D2271 Melanocytic nevi of right lower limb, including hip: Secondary | ICD-10-CM | POA: Diagnosis not present

## 2016-11-17 DIAGNOSIS — D1801 Hemangioma of skin and subcutaneous tissue: Secondary | ICD-10-CM | POA: Diagnosis not present

## 2016-11-17 DIAGNOSIS — I872 Venous insufficiency (chronic) (peripheral): Secondary | ICD-10-CM | POA: Diagnosis not present

## 2016-11-17 DIAGNOSIS — D225 Melanocytic nevi of trunk: Secondary | ICD-10-CM | POA: Diagnosis not present

## 2016-11-17 DIAGNOSIS — L821 Other seborrheic keratosis: Secondary | ICD-10-CM | POA: Diagnosis not present

## 2016-11-19 ENCOUNTER — Ambulatory Visit: Payer: Medicare Other | Admitting: Physical Therapy

## 2016-11-19 DIAGNOSIS — M6281 Muscle weakness (generalized): Secondary | ICD-10-CM

## 2016-11-19 DIAGNOSIS — R293 Abnormal posture: Secondary | ICD-10-CM | POA: Diagnosis not present

## 2016-11-19 DIAGNOSIS — R2689 Other abnormalities of gait and mobility: Secondary | ICD-10-CM

## 2016-11-19 NOTE — Therapy (Signed)
[facility] [address] [place], [place], [address] Phone: [phone]   Fax:  [phone]  Physical Therapy Treatment  Patient Details  Name: [name] MRN: [id number] Date of Birth: 1933-06-17 Referring Provider: Leanna Battles, MD  Encounter Date: 11/19/2016      PT End of Session - 11/19/16 1109    Visit Number 13   Number of Visits 18   Date for PT Re-Evaluation 12/31/16   Authorization Type G code at visit 18;; KX at visit 15   PT Start Time 1105   PT Stop Time 1145   PT Time Calculation (min) 40 min   Activity Tolerance Patient tolerated treatment well      Past Medical History:  Diagnosis Date  . Arthritis    "fingers, hips, feet, ankles" (11/10/2012)  . Asthma   . Atrial fibrillation (HCC)    CHRONIC COUMADIN  . Breast cancer ([place]) 1990's   "cancer on one side; precancerous tissue on the other" (11/10/2012)  . Chronic bronchitis ([place])    "multiple times; not in a long time" (11/10/2012  . Depression   . GERD (gastroesophageal reflux disease)   . Hearing impaired   . Hypertension   . Irritable bowel   . Lymphedema    HX OF - IN LEFT ARM--NO NEEDLES OR B/P'S LEFT ARM  . Macular degeneration    "BEGINNINGS OF" MACULAR DEGENERATION  . Osteoporosis   . Pneumonia    "multiple times; not in a long time" (11/10/2012)  . Recurrent UTI   . Sleep apnea    "dx'd w/it; don't wear mask or anything" (11/10/2012)  . Stroke ([place]) ~ 2005   HX OF TIA-NO RESIDUAL PROBLEM--PARALYSIS RT SIDE FACE /PT'S MOUTH DROOPS-AND LOSS OF HEARING RT EAR --SINCE EAR SURGERY AS A CHILD   . UTI (lower urinary tract infection)    FREQUENT    Past Surgical History:  Procedure Laterality Date  . BREAST BIOPSY Bilateral 1990's  . Dacryocystorhinostomy  02/18/2016  . INNER EAR SURGERY Right    MULTIPLE EAR SURGERIES,  . JOINT REPLACEMENT    . KNEE ARTHROSCOPY  04/07/2012   Procedure: ARTHROSCOPY KNEE;  Surgeon: Gearlean Alf, MD;  [facility];  Service: Orthopedics;  Laterality: Left;  WITH SYNOVECTOMY  . MASTECTOMY Bilateral 1990's  . MASTOIDECTOMY Right 1942  . TONSILLECTOMY    . TOTAL KNEE ARTHROPLASTY  09/20/2011   LEFT TOTAL KNEE ARTHROPLASTY;  Surgeon: Gearlean Alf, MD;  Location: [facility];  Service: Orthopedics;  Laterality: Left;  . TOTAL KNEE ARTHROPLASTY  2006    There were no vitals filed for this visit.      Subjective Assessment - 11/19/16 1107    Subjective I have a UTI.  I'm achy all over.  This has done a number on me.  Reports she is walking better overall though and getting up from a chair is variable.  I want to be able to do stairs better and faster.  I want to get better with getting up from a chair.     Pertinent History bil. TKA, breast cancer, osteoporosis   Currently in Pain? No/denies   Multiple Pain Sites No            [facility] PT Assessment - 11/19/16 0001      Strength   Overall Strength Comments Hip strength 4/5, knee strength 4+/5, ankle DF 4/5 bil.       Standardized Balance Assessment  Standardized Balance Assessment --  stairs 2 rails 33 sec   Five times sit to stand comments  30      Berg Balance Test   Sit to Stand Able to stand using hands after several tries   Standing Unsupported Able to stand safely 2 minutes   Sitting with Back Unsupported but Feet Supported on Floor or Stool Able to sit safely and securely 2 minutes   Stand to Sit Controls descent by using hands   Transfers Able to transfer safely, definite need of hands   Standing Unsupported with Eyes Closed Able to stand 10 seconds with supervision   Standing Ubsupported with Feet Together Able to place feet together independently but unable to hold for 30 seconds   From Standing, Reach Forward with Outstretched Arm Can reach forward >5 cm safely (2")   From Standing Position, Pick up Object from Floor Unable to pick up shoe, but reaches 2-5 cm (1-2") from shoe and  balances independently   From Standing Position, Turn to Look Behind Over each Shoulder Looks behind one side only/other side shows less weight shift   Turn 360 Degrees Able to turn 360 degrees safely but slowly   Standing Unsupported, Alternately Place Feet on Step/Stool Needs assistance to keep from falling or unable to try   Standing Unsupported, One Foot in Front Needs help to step but can hold 15 seconds   Standing on One Leg Unable to try or needs assist to prevent fall   Total Score 31     Timed Up and Go Test   TUG Normal TUG   Normal TUG (seconds) 22   TUG Comments no cane                     [facility] Adult PT Treatment/Exercise - 11/19/16 0001      Ambulation/Gait   Stairs Yes   Stairs Assistance --  great difficulty with 1 railing and cane   Gait Comments with SPC 120 feet with supervision     Therapeutic Activites    Other Therapeutic Activities partial squatting; standing with upright posture; stairs, curbs     Neuro Re-ed    Neuro Re-ed Details  dynamic balance reaching in different planes     Knee/Hip Exercises: Aerobic   Nustep L1 10 min     Knee/Hip Exercises: Seated   Sit to Sand 5 reps                  PT Short Term Goals - 11/19/16 1114      PT SHORT TERM GOAL #1   Title be independent in initial HEP   Status Achieved     PT SHORT TERM GOAL #2   Title verbalize and demonstrate understanding of fall prevention   Status Achieved     PT SHORT TERM GOAL #3   Title perform 5x sit to stand in < or = to 30 seconds to reduce falls risk   Status Achieved     PT SHORT TERM GOAL #4   Title perform TUG in < or = to 23 seconds to reduce falls risk   Status Achieved           PT Long Term Goals - 11/19/16 1131      PT LONG TERM GOAL #1   Title be independent in advanced HEP   Time 8   Period Weeks   Status On-going     PT LONG TERM GOAL #2  Title improve LE strength to demonstrate stand to sit with controlled descent    Time 8   Period Weeks   Status On-going     PT LONG TERM GOAL #3   Title perform 5x sit to stand in < or = to 25 seconds   Time 8   Period Weeks   Status On-going     PT LONG TERM GOAL #4   Title perform TUG in < or = to 20 seconds to reduce falls risk   Time 8   Period Weeks   Status On-going     PT LONG TERM GOAL #5   Title BERG balance score improved from 31/56 to 36/56 indicating decreased risk of falls with ADLs   Time 6   Status New               Plan - 11/19/16 1133    Clinical Impression Statement The patient was recently diagnosed with a UTI and therefore she did not show as much of an improvement on functional measurements as anticipated.  Her BERG balance score is a 31/56 which indicates she is at high risk for falls.  Discussed use of a rolling walker with patient but she would like to wait to see if her balance will improve enough where she can use her cane instead.  She has personal goals of improving with stair agility and greater ease with sit to stand.  Recommend continued PT to address these deficits.     Rehab Potential Good   PT Frequency 2x / week   PT Duration 8 weeks   PT Treatment/Interventions ADLs/Self Care Home Management;Functional mobility training;Stair training;Gait training;Therapeutic activities;Therapeutic exercise;Neuromuscular re-education;Patient/family education;Manual techniques;Taping   PT Next Visit Plan Strength, balance, gait velocity;  squatting;  step downs from a curb;  stairs;  determine if patient needs full-time rolling walker for safety      Patient will benefit from skilled therapeutic intervention in order to improve the following deficits and impairments:  Abnormal gait, Decreased activity tolerance, Decreased balance, Decreased strength, Postural dysfunction, Improper body mechanics, Impaired flexibility, Decreased safety awareness, Decreased endurance, Difficulty walking  Visit Diagnosis: Other abnormalities of gait and  mobility - Plan: PT plan of care cert/re-cert  Muscle weakness (generalized) - Plan: PT plan of care cert/re-cert  Abnormal posture - Plan: PT plan of care cert/re-cert     Problem List Patient Active Problem List   Diagnosis Date Noted  . Anemia 11/10/2012    Class: Chronic  . Syncope 11/09/2012  . Urinary tract infection, recurrent 11/09/2012  . Hyponatremia 11/09/2012  . Diarrhea 11/09/2012  . Paroxysmal atrial fibrillation ([place]) 11/09/2012  . Hypertension 11/09/2012  . Synovitis of knee 04/07/2012  . Postop Hypokalemia 09/23/2011  . Postop Acute blood loss anemia 09/21/2011  . Postop Transfusion 09/21/2011  . OA (osteoarthritis) of knee 09/20/2011   Ruben Im, PT 11/19/16 11:56 AM Phone: [phone] Fax: [phone]  Alvera Singh 11/19/2016, 11:55 AM  [facility] [address] [place], [place], [address] Phone: [phone]   Fax:  [phone]  Name: Layann Bluett MRN: [id number] Date of Birth: [birth year]

## 2016-11-22 ENCOUNTER — Ambulatory Visit: Payer: Medicare Other | Admitting: Physical Therapy

## 2016-11-22 ENCOUNTER — Encounter: Payer: Self-pay | Admitting: Physical Therapy

## 2016-11-22 DIAGNOSIS — R293 Abnormal posture: Secondary | ICD-10-CM

## 2016-11-22 DIAGNOSIS — R2689 Other abnormalities of gait and mobility: Secondary | ICD-10-CM | POA: Diagnosis not present

## 2016-11-22 DIAGNOSIS — M6281 Muscle weakness (generalized): Secondary | ICD-10-CM | POA: Diagnosis not present

## 2016-11-22 NOTE — Therapy (Signed)
[facility] [address] [place], [place], [address] Phone: [phone]   Fax:  [phone]  Physical Therapy Treatment  Patient Details  Name: [name] MRN: [id number] Date of Birth: 08/21/1933 Referring Provider: Leanna Battles, MD  Encounter Date: 11/22/2016      PT End of Session - 11/22/16 1237    Visit Number 14   Number of Visits 18   Date for PT Re-Evaluation 12/31/16   Authorization Type G code at visit 18;; KX at visit 15   PT Start Time 1234   PT Stop Time (P)  1312   PT Time Calculation (min) (P)  38 min   Activity Tolerance Patient tolerated treatment well   Behavior During Therapy [facility] for tasks assessed/performed      Past Medical History:  Diagnosis Date  . Arthritis    "fingers, hips, feet, ankles" (11/10/2012)  . Asthma   . Atrial fibrillation (HCC)    CHRONIC COUMADIN  . Breast cancer ([place]) 1990's   "cancer on one side; precancerous tissue on the other" (11/10/2012)  . Chronic bronchitis ([place])    "multiple times; not in a long time" (11/10/2012  . Depression   . GERD (gastroesophageal reflux disease)   . Hearing impaired   . Hypertension   . Irritable bowel   . Lymphedema    HX OF - IN LEFT ARM--NO NEEDLES OR B/P'S LEFT ARM  . Macular degeneration    "BEGINNINGS OF" MACULAR DEGENERATION  . Osteoporosis   . Pneumonia    "multiple times; not in a long time" (11/10/2012)  . Recurrent UTI   . Sleep apnea    "dx'd w/it; don't wear mask or anything" (11/10/2012)  . Stroke ([place]) ~ 2005   HX OF TIA-NO RESIDUAL PROBLEM--PARALYSIS RT SIDE FACE /PT'S MOUTH DROOPS-AND LOSS OF HEARING RT EAR --SINCE EAR SURGERY AS A CHILD   . UTI (lower urinary tract infection)    FREQUENT    Past Surgical History:  Procedure Laterality Date  . BREAST BIOPSY Bilateral 1990's  . Dacryocystorhinostomy  02/18/2016  . INNER EAR SURGERY Right    MULTIPLE EAR SURGERIES,  . JOINT REPLACEMENT    . KNEE  ARTHROSCOPY  04/07/2012   Procedure: ARTHROSCOPY KNEE;  Surgeon: Gearlean Alf, MD;  Location: [facility];  Service: Orthopedics;  Laterality: Left;  WITH SYNOVECTOMY  . MASTECTOMY Bilateral 1990's  . MASTOIDECTOMY Right 1942  . TONSILLECTOMY    . TOTAL KNEE ARTHROPLASTY  09/20/2011   LEFT TOTAL KNEE ARTHROPLASTY;  Surgeon: Gearlean Alf, MD;  Location: [facility];  Service: Orthopedics;  Laterality: Left;  . TOTAL KNEE ARTHROPLASTY  2006    There were no vitals filed for this visit.      Subjective Assessment - 11/22/16 1239    Subjective Still taking antibiotics and they make her feel "funny & tired."    Currently in Pain? No/denies   Multiple Pain Sites No                         [facility] Adult PT Treatment/Exercise - 11/22/16 0001      Ambulation/Gait   Ambulation Distance (Feet) --  50 feet 4x   Assistive device Rolling walker   Gait Comments improved speed and step length     High Level Balance   High Level Balance Activities --  Tandem stance 10sec 2x bil, narrow BOS 3x 10sec no UE suppo   High  Level Balance Comments weight shifting on minin tramp 1 min each way,, stand on black pad for mini stepping and hip abd 10x, heel raises & hip AROM with least amt of UE support 20x each  CGA when on black pad     Therapeutic Activites    Other Therapeutic Activities partial squatting; 10x,     Knee/Hip Exercises: Aerobic   Nustep L2 x 8 min  PTA present                  PT Short Term Goals - 11/19/16 1114      PT SHORT TERM GOAL #1   Title be independent in initial HEP   Status Achieved     PT SHORT TERM GOAL #2   Title verbalize and demonstrate understanding of fall prevention   Status Achieved     PT SHORT TERM GOAL #3   Title perform 5x sit to stand in < or = to 30 seconds to reduce falls risk   Status Achieved     PT SHORT TERM GOAL #4   Title perform TUG in < or = to 23 seconds to reduce falls risk   Status Achieved            PT Long Term Goals - 11/19/16 1131      PT LONG TERM GOAL #1   Title be independent in advanced HEP   Time 8   Period Weeks   Status On-going     PT LONG TERM GOAL #2   Title improve LE strength to demonstrate stand to sit with controlled descent   Time 8   Period Weeks   Status On-going     PT LONG TERM GOAL #3   Title perform 5x sit to stand in < or = to 25 seconds   Time 8   Period Weeks   Status On-going     PT LONG TERM GOAL #4   Title perform TUG in < or = to 20 seconds to reduce falls risk   Time 8   Period Weeks   Status On-going     PT LONG TERM GOAL #5   Title BERG balance score improved from 31/56 to 36/56 indicating decreased risk of falls with ADLs   Time 6   Status New               Plan - 11/22/16 1237    Clinical Impression Statement Pt demonstrates improved speed and step length when using the rolling today in the clinic. Pt does not want to use the rolling walker at this point.  Pt was wobbly during balance ex with narrow BOS and tandem stance, not feeling confident to take her hands off the support. Energy was low but pt was willing to try everything.    Rehab Potential Good   PT Frequency 2x / week   PT Duration 8 weeks   PT Treatment/Interventions ADLs/Self Care Home Management;Functional mobility training;Stair training;Gait training;Therapeutic activities;Therapeutic exercise;Neuromuscular re-education;Patient/family education;Manual techniques;Taping   PT Next Visit Plan Strength, balance, gait velocity;  squatting;  step downs from a curb;  stairs;    Consulted and Agree with Plan of Care Patient      Patient will benefit from skilled therapeutic intervention in order to improve the following deficits and impairments:  Abnormal gait, Decreased activity tolerance, Decreased balance, Decreased strength, Postural dysfunction, Improper body mechanics, Impaired flexibility, Decreased safety awareness, Decreased endurance, Difficulty  walking  Visit Diagnosis: Other abnormalities of gait and  mobility  Abnormal posture  Muscle weakness (generalized)     Problem List Patient Active Problem List   Diagnosis Date Noted  . Anemia 11/10/2012    Class: Chronic  . Syncope 11/09/2012  . Urinary tract infection, recurrent 11/09/2012  . Hyponatremia 11/09/2012  . Diarrhea 11/09/2012  . Paroxysmal atrial fibrillation ([place]) 11/09/2012  . Hypertension 11/09/2012  . Synovitis of knee 04/07/2012  . Postop Hypokalemia 09/23/2011  . Postop Acute blood loss anemia 09/21/2011  . Postop Transfusion 09/21/2011  . OA (osteoarthritis) of knee 09/20/2011    Keshonna Valvo, PTA 11/22/2016, 1:47 PM  [facility] [address] [place], [place], [address] Phone: [phone]   Fax:  [phone]  Name: Sebrina Kessner MRN: [id number] Date of Birth: 02/09/1934

## 2016-11-26 ENCOUNTER — Ambulatory Visit: Payer: Medicare Other | Admitting: Physical Therapy

## 2016-11-26 DIAGNOSIS — M6281 Muscle weakness (generalized): Secondary | ICD-10-CM

## 2016-11-26 DIAGNOSIS — R293 Abnormal posture: Secondary | ICD-10-CM

## 2016-11-26 DIAGNOSIS — R2689 Other abnormalities of gait and mobility: Secondary | ICD-10-CM | POA: Diagnosis not present

## 2016-11-26 NOTE — Therapy (Signed)
[facility] [address], [place], [place], [address] Phone: [phone]   Fax:  [phone]  Physical Therapy Treatment  Patient Details  Name: [name] MRN: [id number] Date of Birth: 1933-12-18 Referring Provider: Leanna Battles, MD  Encounter Date: 11/26/2016      PT End of Session - 11/26/16 1133    Visit Number 15   Number of Visits 18   Date for PT Re-Evaluation 12/31/16   Authorization Type G code at visit 18;; KX at visit 15   PT Start Time 1113   PT Stop Time 1151   PT Time Calculation (min) 38 min   Activity Tolerance Patient tolerated treatment well      Past Medical History:  Diagnosis Date  . Arthritis    "fingers, hips, feet, ankles" (11/10/2012)  . Asthma   . Atrial fibrillation (HCC)    CHRONIC COUMADIN  . Breast cancer ([place]) 1990's   "cancer on one side; precancerous tissue on the other" (11/10/2012)  . Chronic bronchitis ([place])    "multiple times; not in a long time" (11/10/2012  . Depression   . GERD (gastroesophageal reflux disease)   . Hearing impaired   . Hypertension   . Irritable bowel   . Lymphedema    HX OF - IN LEFT ARM--NO NEEDLES OR B/P'S LEFT ARM  . Macular degeneration    "BEGINNINGS OF" MACULAR DEGENERATION  . Osteoporosis   . Pneumonia    "multiple times; not in a long time" (11/10/2012)  . Recurrent UTI   . Sleep apnea    "dx'd w/it; don't wear mask or anything" (11/10/2012)  . Stroke ([place]) ~ 2005   HX OF TIA-NO RESIDUAL PROBLEM--PARALYSIS RT SIDE FACE /PT'S MOUTH DROOPS-AND LOSS OF HEARING RT EAR --SINCE EAR SURGERY AS A CHILD   . UTI (lower urinary tract infection)    FREQUENT    Past Surgical History:  Procedure Laterality Date  . BREAST BIOPSY Bilateral 1990's  . Dacryocystorhinostomy  02/18/2016  . INNER EAR SURGERY Right    MULTIPLE EAR SURGERIES,  . JOINT REPLACEMENT    . KNEE ARTHROSCOPY  04/07/2012   Procedure: ARTHROSCOPY KNEE;  Surgeon: Gearlean Alf, MD;  Location: [facility];  Service: Orthopedics;  Laterality: Left;  WITH SYNOVECTOMY  . MASTECTOMY Bilateral 1990's  . MASTOIDECTOMY Right 1942  . TONSILLECTOMY    . TOTAL KNEE ARTHROPLASTY  09/20/2011   LEFT TOTAL KNEE ARTHROPLASTY;  Surgeon: Gearlean Alf, MD;  Location[facility];  Service: Orthopedics;  Laterality: Left;  . TOTAL KNEE ARTHROPLASTY  2006    There were no vitals filed for this visit.      Subjective Assessment - 11/26/16 1114    Subjective Patient arrives 13 min late.  States she is exhausted from traveling to [place] for a funeral.  Got back late last night.  Did a lot of walking.  Just finished an antibiotic for UTI "but I don't think it worked."     Pertinent History bil. TKA, breast cancer, osteoporosis   Currently in Pain? No/denies   Multiple Pain Sites No                         [facility] Adult PT Treatment/Exercise - 11/26/16 0001      Therapeutic Activites    Other Therapeutic Activities partial squatting; 10x,     Neuro Re-ed    Neuro Re-ed Details  dynamic balance reaching in  different planes     Knee/Hip Exercises: Stretches   Active Hamstring Stretch Right;Left;3 reps;30 seconds   Active Hamstring Stretch Limitations on 2nd step   Hip Flexor Stretch Limitations 5x on 2nd step     Knee/Hip Exercises: Aerobic   Nustep L1 10 min     Knee/Hip Exercises: Standing   Forward Step Up Right;Left;1 set;Hand Hold: 2;Step Height: 6"   Forward Step Up Limitations 2x5   Functional Squat 5 reps  with single arm reach to steps   Other Standing Knee Exercises reaching forward and laterally with stepping 5x each     Knee/Hip Exercises: Seated   Long Arc Quad Strengthening;Right;Left;10 reps   Long Arc Quad Limitations red band   Knee/Hip Flexion red band 10x bil   Other Seated Knee/Hip Exercises foam roll push down 10x                  PT Short Term Goals - 11/26/16 1200      PT SHORT TERM GOAL #1   Title  be independent in initial HEP   Status Achieved     PT SHORT TERM GOAL #2   Title verbalize and demonstrate understanding of fall prevention   Status Achieved     PT SHORT TERM GOAL #3   Title perform 5x sit to stand in < or = to 30 seconds to reduce falls risk   Status Achieved     PT SHORT TERM GOAL #4   Title perform TUG in < or = to 23 seconds to reduce falls risk   Status Achieved           PT Long Term Goals - 11/26/16 1200      PT LONG TERM GOAL #1   Title be independent in advanced HEP   Time 8   Period Weeks   Status On-going     PT LONG TERM GOAL #2   Title improve LE strength to demonstrate stand to sit with controlled descent   Time 8   Period Weeks   Status On-going     PT LONG TERM GOAL #3   Title perform 5x sit to stand in < or = to 25 seconds   Time 8   Period Weeks   Status On-going     PT LONG TERM GOAL #4   Time 8   Period Weeks   Status On-going     PT LONG TERM GOAL #5   Title BERG balance score improved from 31/56 to 36/56 indicating decreased risk of falls with ADLs   Time 6   Period Weeks   Status On-going               Plan - 11/26/16 1133    Clinical Impression Statement The patient continues to recover from a recent UTI and excessive fatigue following traveling to [place].  She is able to participate in standing dynamic balance ex's but needs a sitting break secondary to "feeling out of breath."  Close supervision for safety.   Patient would benefit from a rolling walker however patient is not receptive at this time.     Rehab Potential Good   PT Frequency 2x / week   PT Duration 8 weeks   PT Treatment/Interventions ADLs/Self Care Home Management;Functional mobility training;Stair training;Gait training;Therapeutic activities;Therapeutic exercise;Neuromuscular re-education;Patient/family education;Manual techniques;Taping   PT Next Visit Plan Strength, balance, gait velocity;  squatting;  step downs from a curb;  stairs ;  KX  modifiers  Patient will benefit from skilled therapeutic intervention in order to improve the following deficits and impairments:  Abnormal gait, Decreased activity tolerance, Decreased balance, Decreased strength, Postural dysfunction, Improper body mechanics, Impaired flexibility, Decreased safety awareness, Decreased endurance, Difficulty walking  Visit Diagnosis: Other abnormalities of gait and mobility  Abnormal posture  Muscle weakness (generalized)     Problem List Patient Active Problem List   Diagnosis Date Noted  . Anemia 11/10/2012    Class: Chronic  . Syncope 11/09/2012  . Urinary tract infection, recurrent 11/09/2012  . Hyponatremia 11/09/2012  . Diarrhea 11/09/2012  . Paroxysmal atrial fibrillation ([place]) 11/09/2012  . Hypertension 11/09/2012  . Synovitis of knee 04/07/2012  . Postop Hypokalemia 09/23/2011  . Postop Acute blood loss anemia 09/21/2011  . Postop Transfusion 09/21/2011  . OA (osteoarthritis) of knee 09/20/2011   Ruben Im, PT 11/26/16 12:03 PM Phone: [phone] Fax: [phone]  Alvera Singh 11/26/2016, 12:02 PM  [facility] [address], [place], [place], [address] Phone: [phone]   Fax:  [phone]  Name: Gretta Samons MRN: [id number] Date of Birth: 1934/01/10

## 2016-12-02 ENCOUNTER — Encounter: Payer: Self-pay | Admitting: Physical Therapy

## 2016-12-02 ENCOUNTER — Ambulatory Visit: Payer: Medicare Other | Attending: Internal Medicine | Admitting: Physical Therapy

## 2016-12-02 DIAGNOSIS — R2689 Other abnormalities of gait and mobility: Secondary | ICD-10-CM

## 2016-12-02 DIAGNOSIS — M6281 Muscle weakness (generalized): Secondary | ICD-10-CM | POA: Diagnosis not present

## 2016-12-02 DIAGNOSIS — R293 Abnormal posture: Secondary | ICD-10-CM | POA: Diagnosis not present

## 2016-12-02 NOTE — Therapy (Signed)
[facility] [address], [place], [place], [address] Phone: [phone]   Fax:  [phone]  Physical Therapy Treatment  Patient Details  Name: [name] MRN: [id number] Date of Birth: Dec 02, 1933 Referring Provider: Leanna Battles, MD  Encounter Date: 12/02/2016      PT End of Session - 12/02/16 1434    Visit Number 16   Number of Visits 18   Date for PT Re-Evaluation 12/31/16   Authorization Type G code at visit 18;; KX at visit 15   PT Start Time 1423   Activity Tolerance Patient tolerated treatment well   Behavior During Therapy [facility] for tasks assessed/performed      Past Medical History:  Diagnosis Date  . Arthritis    "fingers, hips, feet, ankles" (11/10/2012)  . Asthma   . Atrial fibrillation (HCC)    CHRONIC COUMADIN  . Breast cancer ([place]) 1990's   "cancer on one side; precancerous tissue on the other" (11/10/2012)  . Chronic bronchitis ([place])    "multiple times; not in a long time" (11/10/2012  . Depression   . GERD (gastroesophageal reflux disease)   . Hearing impaired   . Hypertension   . Irritable bowel   . Lymphedema    HX OF - IN LEFT ARM--NO NEEDLES OR B/P'S LEFT ARM  . Macular degeneration    "BEGINNINGS OF" MACULAR DEGENERATION  . Osteoporosis   . Pneumonia    "multiple times; not in a long time" (11/10/2012)  . Recurrent UTI   . Sleep apnea    "dx'd w/it; don't wear mask or anything" (11/10/2012)  . Stroke ([place]) ~ 2005   HX OF TIA-NO RESIDUAL PROBLEM--PARALYSIS RT SIDE FACE /PT'S MOUTH DROOPS-AND LOSS OF HEARING RT EAR --SINCE EAR SURGERY AS A CHILD   . UTI (lower urinary tract infection)    FREQUENT    Past Surgical History:  Procedure Laterality Date  . BREAST BIOPSY Bilateral 1990's  . Dacryocystorhinostomy  02/18/2016  . INNER EAR SURGERY Right    MULTIPLE EAR SURGERIES,  . JOINT REPLACEMENT    . KNEE ARTHROSCOPY  04/07/2012   Procedure: ARTHROSCOPY KNEE;  Surgeon: Gearlean Alf, MD;  [facility];  Service: Orthopedics;  Laterality: Left;  WITH SYNOVECTOMY  . MASTECTOMY Bilateral 1990's  . MASTOIDECTOMY Right 1942  . TONSILLECTOMY    . TOTAL KNEE ARTHROPLASTY  09/20/2011   LEFT TOTAL KNEE ARTHROPLASTY;  Surgeon: Gearlean Alf, MD;  [facility];  Service: Orthopedics;  Laterality: Left;  . TOTAL KNEE ARTHROPLASTY  2006    There were no vitals filed for this visit.      Subjective Assessment - 12/02/16 1430    Subjective Pt states she was not able to exercise when she was away last week.  States she is feeling stronger overall but not feeling like she has more balance   Pertinent History bil. TKA, breast cancer, osteoporosis   Limitations Standing;Walking   How long can you stand comfortably? fatigue   How long can you walk comfortably? if shopping and using a cart- up to 1 hour   Patient Stated Goals improve balance, reduce falls risk, improve strength   Currently in Pain? No/denies                         [facility] Adult PT Treatment/Exercise - 12/02/16 0001      Therapeutic Activites    Other Therapeutic Activities partial squatting; 10x,  Neuro Re-ed    Neuro Re-ed Details  dynamic balance reaching in different planes     Knee/Hip Exercises: Stretches   Active Hamstring Stretch Right;Left;3 reps;30 seconds   Active Hamstring Stretch Limitations on 2nd step   Hip Flexor Stretch Limitations 5x on 2nd step     Knee/Hip Exercises: Aerobic   Nustep L1 10 min     Knee/Hip Exercises: Standing   Forward Step Up Right;Left;1 set;Hand Hold: 2;Step Height: 6"   Forward Step Up Limitations 2x5   Functional Squat 10 reps  with double arm reach to steps   Other Standing Knee Exercises reaching forward and laterally with stepping 5x each     Knee/Hip Exercises: Seated   Long Arc Quad Strengthening;Right;Left;10 reps   Long Arc Quad Weight 4 lbs.   Long [employer] Limitations --   Knee/Hip Flexion red  band 10x bil   Other Seated Knee/Hip Exercises foam roll push down 10x                  PT Short Term Goals - 11/26/16 1200      PT SHORT TERM GOAL #1   Title be independent in initial HEP   Status Achieved     PT SHORT TERM GOAL #2   Title verbalize and demonstrate understanding of fall prevention   Status Achieved     PT SHORT TERM GOAL #3   Title perform 5x sit to stand in < or = to 30 seconds to reduce falls risk   Status Achieved     PT SHORT TERM GOAL #4   Title perform TUG in < or = to 23 seconds to reduce falls risk   Status Achieved           PT Long Term Goals - 12/02/16 1432      PT LONG TERM GOAL #1   Title be independent in advanced HEP   Time 8   Period Weeks   Status On-going     PT LONG TERM GOAL #2   Title improve LE strength to demonstrate stand to sit with controlled descent   Baseline pt feels like it is better   Time 8   Period Weeks   Status On-going               Plan - 12/02/16 1431    Clinical Impression Statement Patient continues to have fatigue and needs close supervision for safety.  She had one LOB and caught herself on the wall.  This occurred while she was turning.  Improved squat and able to reach with both arms and not hold steps.  Pt continues to need skilled PT for improved stabilty.   Rehab Potential Good   PT Treatment/Interventions ADLs/Self Care Home Management;Functional mobility training;Stair training;Gait training;Therapeutic activities;Therapeutic exercise;Neuromuscular re-education;Patient/family education;Manual techniques;Taping   PT Next Visit Plan Strength, balance, gait velocity;  squatting;  step downs from a curb;  stairs ;  KX modifiers, TUG and sit to stand   Consulted and Agree with Plan of Care Patient      Patient will benefit from skilled therapeutic intervention in order to improve the following deficits and impairments:  Abnormal gait, Decreased activity tolerance, Decreased balance,  Decreased strength, Postural dysfunction, Improper body mechanics, Impaired flexibility, Decreased safety awareness, Decreased endurance, Difficulty walking  Visit Diagnosis: Other abnormalities of gait and mobility  Abnormal posture  Muscle weakness (generalized)     Problem List Patient Active Problem List   Diagnosis Date Noted  .  Anemia 11/10/2012    Class: Chronic  . Syncope 11/09/2012  . Urinary tract infection, recurrent 11/09/2012  . Hyponatremia 11/09/2012  . Diarrhea 11/09/2012  . Paroxysmal atrial fibrillation ([place]) 11/09/2012  . Hypertension 11/09/2012  . Synovitis of knee 04/07/2012  . Postop Hypokalemia 09/23/2011  . Postop Acute blood loss anemia 09/21/2011  . Postop Transfusion 09/21/2011  . OA (osteoarthritis) of knee 09/20/2011    Zannie Cove, PT 12/02/2016, 3:08 PM  [facility] [address], [place], [place], [address] Phone: [phone]   Fax:  [phone]  Name: [name] MRN: [id number] Date of Birth: [birth year]

## 2016-12-06 ENCOUNTER — Ambulatory Visit: Payer: Medicare Other | Admitting: Physical Therapy

## 2016-12-06 DIAGNOSIS — R293 Abnormal posture: Secondary | ICD-10-CM

## 2016-12-06 DIAGNOSIS — M6281 Muscle weakness (generalized): Secondary | ICD-10-CM | POA: Diagnosis not present

## 2016-12-06 DIAGNOSIS — R2689 Other abnormalities of gait and mobility: Secondary | ICD-10-CM

## 2016-12-06 NOTE — Therapy (Signed)
[facility] [address] [place], [place], [address] Phone: [phone]   Fax:  [phone]  Physical Therapy Treatment  Patient Details  Name: [name] MRN: [id number] Date of Birth: [birth year] Referring Provider: Leanna Battles, MD  Encounter Date: 12/06/2016      PT End of Session - 12/06/16 1308    Visit Number 17   Number of Visits 18   Date for PT Re-Evaluation 12/31/16   Authorization Type G code at visit 18;; KX at visit 15   PT Start Time 1235   PT Stop Time 1313   PT Time Calculation (min) 38 min   Activity Tolerance Patient tolerated treatment well   Behavior During Therapy [facility] for tasks assessed/performed      Past Medical History:  Diagnosis Date  . Arthritis    "fingers, hips, feet, ankles" (11/10/2012)  . Asthma   . Atrial fibrillation (HCC)    CHRONIC COUMADIN  . Breast cancer ([place]) 1990's   "cancer on one side; precancerous tissue on the other" (11/10/2012)  . Chronic bronchitis ([place])    "multiple times; not in a long time" (11/10/2012  . Depression   . GERD (gastroesophageal reflux disease)   . Hearing impaired   . Hypertension   . Irritable bowel   . Lymphedema    HX OF - IN LEFT ARM--NO NEEDLES OR B/P'S LEFT ARM  . Macular degeneration    "BEGINNINGS OF" MACULAR DEGENERATION  . Osteoporosis   . Pneumonia    "multiple times; not in a long time" (11/10/2012)  . Recurrent UTI   . Sleep apnea    "dx'd w/it; don't wear mask or anything" (11/10/2012)  . 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 UTI (lower urinary tract infection)    FREQUENT    Past Surgical History:  Procedure Laterality Date  . BREAST BIOPSY Bilateral 1990's  . Dacryocystorhinostomy  02/18/2016  . INNER EAR SURGERY Right    MULTIPLE EAR SURGERIES,  . JOINT REPLACEMENT    . KNEE ARTHROSCOPY   04/07/2012   Procedure: ARTHROSCOPY KNEE;  Surgeon: Gearlean Alf, MD;  Location: [facility];  Service: Orthopedics;  Laterality: Left;  WITH SYNOVECTOMY  . MASTECTOMY Bilateral 1990's  . MASTOIDECTOMY Right 1942  . TONSILLECTOMY    . TOTAL KNEE ARTHROPLASTY  09/20/2011   LEFT TOTAL KNEE ARTHROPLASTY;  Surgeon: Gearlean Alf, MD;  Location: [facility];  Service: Orthopedics;  Laterality: Left;  . TOTAL KNEE ARTHROPLASTY  2006    There were no vitals filed for this visit.      Subjective Assessment - 12/06/16 1312    Subjective I am very tired because of this UTI that is not clearing up   Pertinent History bil. TKA, breast cancer, osteoporosis   Limitations Standing;Walking   How long can you stand comfortably? fatigue   How long can you walk comfortably? if shopping and using a cart- up to 1 hour   Patient Stated Goals improve balance, reduce falls risk, improve strength   Currently in Pain? No/denies                         [facility] Adult PT Treatment/Exercise - 12/06/16 0001      Knee/Hip Exercises: Stretches   Active Hamstring Stretch Right;Left;3 reps;10 seconds  Active Hamstring Stretch Limitations on 2nd step   Hip Flexor Stretch Limitations 5x on 2nd step     Knee/Hip Exercises: Aerobic   Nustep L1 10 min     Knee/Hip Exercises: Standing   Forward Step Up Right;Left;1 set;Hand Hold: 2;Step Height: 6"   Forward Step Up Limitations 2x5   Functional Squat 10 reps  with double arm reach to steps   Other Standing Knee Exercises reaching forward and laterally with stepping 5x each     Knee/Hip Exercises: Seated   Long Arc Quad Strengthening;Right;Left;20 reps  3 sec hold   Knee/Hip Flexion red band 20x bil   Other Seated Knee/Hip Exercises foam roll push down 10x                  PT Short Term Goals - 11/26/16 1200      PT SHORT TERM GOAL #1   Title be independent in initial HEP   Status Achieved     PT SHORT TERM GOAL #2    Title verbalize and demonstrate understanding of fall prevention   Status Achieved     PT SHORT TERM GOAL #3   Title perform 5x sit to stand in < or = to 30 seconds to reduce falls risk   Status Achieved     PT SHORT TERM GOAL #4   Title perform TUG in < or = to 23 seconds to reduce falls risk   Status Achieved           PT Long Term Goals - 12/02/16 1432      PT LONG TERM GOAL #1   Title be independent in advanced HEP   Time 8   Period Weeks   Status On-going     PT LONG TERM GOAL #2   Title improve LE strength to demonstrate stand to sit with controlled descent   Baseline pt feels like it is better   Time 8   Period Weeks   Status On-going               Plan - 12/06/16 1310    Clinical Impression Statement Patient demonstrates improved balance with squats and reaching for functional reaching.  She has been fatigued due to a UTI and being on anti-biotics.  Pt was still able to increase reps of exericses today.  Skilled PT is benefitting patient and she will need to continue working on balance to reduce risk of falls.     PT Treatment/Interventions ADLs/Self Care Home Management;Functional mobility training;Stair training;Gait training;Therapeutic activities;Therapeutic exercise;Neuromuscular re-education;Patient/family education;Manual techniques;Taping   PT Next Visit Plan Strength, balance, gait velocity;  squatting;  step downs from a curb;  stairs ;  KX modifiers, TUG and sit to stand   Consulted and Agree with Plan of Care Patient      Patient will benefit from skilled therapeutic intervention in order to improve the following deficits and impairments:  Abnormal gait, Decreased activity tolerance, Decreased balance, Decreased strength, Postural dysfunction, Improper body mechanics, Impaired flexibility, Decreased safety awareness, Decreased endurance, Difficulty walking  Visit Diagnosis: Other abnormalities of gait and mobility  Abnormal posture  Muscle  weakness (generalized)     Problem List Patient Active Problem List   Diagnosis Date Noted  . Anemia 11/10/2012    Class: Chronic  . Syncope 11/09/2012  . Urinary tract infection, recurrent 11/09/2012  . Hyponatremia 11/09/2012  . Diarrhea 11/09/2012  . Paroxysmal atrial fibrillation ([place]) 11/09/2012  . Hypertension 11/09/2012  . Synovitis of knee 04/07/2012  .  Postop Hypokalemia 09/23/2011  . Postop Acute blood loss anemia 09/21/2011  . Postop Transfusion 09/21/2011  . OA (osteoarthritis) of knee 09/20/2011    Zannie Cove, PT 12/06/2016, 1:12 PM  [facility] [address], [place], [place], [address] Phone: [phone]   Fax:  [phone]  Name: [name] MRN: [id number] Date of Birth: 07-Sep-1933

## 2016-12-07 DIAGNOSIS — I480 Paroxysmal atrial fibrillation: Secondary | ICD-10-CM | POA: Diagnosis not present

## 2016-12-07 DIAGNOSIS — Z7901 Long term (current) use of anticoagulants: Secondary | ICD-10-CM | POA: Diagnosis not present

## 2016-12-09 ENCOUNTER — Encounter: Payer: No Typology Code available for payment source | Admitting: Physical Therapy

## 2016-12-09 DIAGNOSIS — K589 Irritable bowel syndrome without diarrhea: Secondary | ICD-10-CM | POA: Diagnosis not present

## 2016-12-09 DIAGNOSIS — R197 Diarrhea, unspecified: Secondary | ICD-10-CM | POA: Diagnosis not present

## 2016-12-16 ENCOUNTER — Ambulatory Visit: Payer: Medicare Other | Admitting: Physical Therapy

## 2016-12-16 DIAGNOSIS — R293 Abnormal posture: Secondary | ICD-10-CM | POA: Diagnosis not present

## 2016-12-16 DIAGNOSIS — M6281 Muscle weakness (generalized): Secondary | ICD-10-CM | POA: Diagnosis not present

## 2016-12-16 DIAGNOSIS — R2689 Other abnormalities of gait and mobility: Secondary | ICD-10-CM | POA: Diagnosis not present

## 2016-12-16 DIAGNOSIS — N390 Urinary tract infection, site not specified: Secondary | ICD-10-CM | POA: Diagnosis not present

## 2016-12-16 NOTE — Therapy (Signed)
[facility] [address] [place], [place], [address] Phone: [phone]   Fax:  [phone]  Physical Therapy Treatment  Patient Details  Name: [name] MRN: [id number] Date of Birth: [birth year] Referring Provider: Leanna Battles, MD  Encounter Date: 12/16/2016      PT End of Session - 12/16/16 1516    Visit Number 18   Number of Visits 28   Date for PT Re-Evaluation 12/31/16   Authorization Type G code at visit 28;; KX at visit 15   PT Start Time 1446   PT Stop Time 1525   PT Time Calculation (min) 39 min   Activity Tolerance Patient tolerated treatment well      Past Medical History:  Diagnosis Date  . Arthritis    "fingers, hips, feet, ankles" (11/10/2012)  . Asthma   . Atrial fibrillation (HCC)    CHRONIC COUMADIN  . Breast cancer ([place]) 1990's   "cancer on one side; precancerous tissue on the other" (11/10/2012)  . Chronic bronchitis ([place])    "multiple times; not in a long time" (11/10/2012  . Depression   . GERD (gastroesophageal reflux disease)   . Hearing impaired   . Hypertension   . Irritable bowel   . Lymphedema    HX OF - IN LEFT ARM--NO NEEDLES OR B/P'S LEFT ARM  . Macular degeneration    "BEGINNINGS OF" MACULAR DEGENERATION  . Osteoporosis   . Pneumonia    "multiple times; not in a long time" (11/10/2012)  . Recurrent UTI   . Sleep apnea    "dx'd w/it; don't wear mask or anything" (11/10/2012)  . Stroke ([place]) ~ 2005   HX OF TIA-NO RESIDUAL PROBLEM--PARALYSIS RT SIDE FACE /PT'S MOUTH DROOPS-AND LOSS OF HEARING RT EAR --SINCE EAR SURGERY AS A CHILD   . UTI (lower urinary tract infection)    FREQUENT    Past Surgical History:  Procedure Laterality Date  . BREAST BIOPSY Bilateral 1990's  . Dacryocystorhinostomy  02/18/2016  . INNER EAR SURGERY Right    MULTIPLE EAR SURGERIES,  . JOINT REPLACEMENT    . KNEE ARTHROSCOPY  04/07/2012   Procedure: ARTHROSCOPY KNEE;  Surgeon: Gearlean Alf, MD;  Location: [facility];  Service: Orthopedics;  Laterality: Left;  WITH SYNOVECTOMY  . MASTECTOMY Bilateral 1990's  . MASTOIDECTOMY Right 1942  . TONSILLECTOMY    . TOTAL KNEE ARTHROPLASTY  09/20/2011   LEFT TOTAL KNEE ARTHROPLASTY;  Surgeon: Gearlean Alf, MD;  Location: [facility];  Service: Orthopedics;  Laterality: Left;  . TOTAL KNEE ARTHROPLASTY  2006    There were no vitals filed for this visit.      Subjective Assessment - 12/16/16 1450    Subjective I'm feeling better.  My balance is good today and yesterday.  "I've been walking well without my cane."  Presents without cane to therapy.     Pertinent History bil. TKA, breast cancer, osteoporosis   Currently in Pain? No/denies   Multiple Pain Sites No            [facility] PT Assessment - 12/16/16 0001      Standardized Balance Assessment   Standardized Balance Assessment --  27 sec 2 rails reciprocal   Five times sit to stand comments  18.8  with hands     Timed Up and Go Test   TUG Normal TUG   Normal TUG (seconds) 18   TUG Comments no cane  [place] Adult PT Treatment/Exercise - 12/16/16 0001      Therapeutic Activites    Other Therapeutic Activities partial squatting; reaching,sit to stand     Neuro Re-ed    Neuro Re-ed Details  dynamic balance reaching in different planes     Knee/Hip Exercises: Stretches   Active Hamstring Stretch Right;Left;3 reps;10 seconds   Active Hamstring Stretch Limitations on 2nd step   Hip Flexor Stretch Limitations 5x on 2nd step     Knee/Hip Exercises: Aerobic   Nustep L1 4 min, L2 4 min     Knee/Hip Exercises: Standing   Forward Step Up Right;Left;1 set;Hand Hold: 2;Step Height: 6"   Forward Step Up Limitations 2x5   Functional Squat 10 reps  with double arm reach to steps   Other Standing Knee Exercises reaching forward and laterally with stepping 5x each     Knee/Hip Exercises: Seated   Long Arc Quad  Strengthening;Right;Left;10 reps   Long Arc Quad Limitations red band   Clamshell with TheraBand Green   Knee/Hip Flexion red band 10x bil   Other Seated Knee/Hip Exercises foam roll push down 10x   Sit to [employer] 5 reps                  PT Short Term Goals - 12/16/16 1539      PT SHORT TERM GOAL #1   Title be independent in initial HEP   Status Achieved     PT SHORT TERM GOAL #2   Title verbalize and demonstrate understanding of fall prevention   Status Achieved     PT SHORT TERM GOAL #3   Title perform 5x sit to stand in < or = to 30 seconds to reduce falls risk   Status Achieved     PT SHORT TERM GOAL #4   Title perform TUG in < or = to 23 seconds to reduce falls risk   Status Achieved           PT Long Term Goals - 12/16/16 1540      PT LONG TERM GOAL #1   Title be independent in advanced HEP   Time 8   Period Weeks   Status On-going     PT LONG TERM GOAL #2   Title improve LE strength to demonstrate stand to sit with controlled descent   Time 8   Period Weeks   Status On-going     PT LONG TERM GOAL #3   Title perform 5x sit to stand in < or = to 25 seconds   Status Achieved     PT LONG TERM GOAL #4   Title perform TUG in < or = to 20 seconds to reduce falls risk   Status Achieved     PT LONG TERM GOAL #5   Title BERG balance score improved from 31/56 to 36/56 indicating decreased risk of falls with ADLs   Time 6   Period Weeks   Status On-going               Plan - 12/16/16 1517    Clinical Impression Statement The patient has made good improvements in sit to stand timed test and with speed ascending/descending 4 steps.  She has great difficulty with staggered standing without UE support.  Needs close supervision for safety.  Able to tolerate standing for a longer period of time before needing a seated rest break.    Progressing with LTGs.     Rehab Potential Good  PT Frequency 2x / week   PT Duration 8 weeks   PT  Treatment/Interventions ADLs/Self Care Home Management;Functional mobility training;Stair training;Gait training;Therapeutic activities;Therapeutic exercise;Neuromuscular re-education;Patient/family education;Manual techniques;Taping   PT Next Visit Plan Strength, balance, gait velocity;  squatting;  step downs from a curb;  stairs ;  KX modifiers      Patient will benefit from skilled therapeutic intervention in order to improve the following deficits and impairments:  Abnormal gait, Decreased activity tolerance, Decreased balance, Decreased strength, Postural dysfunction, Improper body mechanics, Impaired flexibility, Decreased safety awareness, Decreased endurance, Difficulty walking  Visit Diagnosis: Other abnormalities of gait and mobility  Abnormal posture  Muscle weakness (generalized)       G-Codes - 01-01-2017 1446    Functional Assessment Tool Used (Outpatient Only) 5x sit to stand, TUG    Functional Limitation Mobility: Walking and moving around   Mobility: Walking and Moving Around Current Status [phone]) At least 60 percent but less than 80 percent impaired, limited or restricted   Mobility: Walking and Moving Around Goal Status [phone]) At least 40 percent but less than 60 percent impaired, limited or restricted      Problem List Patient Active Problem List   Diagnosis Date Noted  . Anemia 11/10/2012    Class: Chronic  . Syncope 11/09/2012  . Urinary tract infection, recurrent 11/09/2012  . Hyponatremia 11/09/2012  . Diarrhea 11/09/2012  . Paroxysmal atrial fibrillation ([place]) 11/09/2012  . Hypertension 11/09/2012  . Synovitis of knee 04/07/2012  . Postop Hypokalemia 09/23/2011  . Postop Acute blood loss anemia 09/21/2011  . Postop Transfusion 09/21/2011  . OA (osteoarthritis) of knee 09/20/2011   Ruben Im, PT 01-01-17 3:42 PM Phone: [phone] Fax: [phone]  Alvera Singh 01-01-2017, 3:42 PM  [facility] [address], [place], [place], [address] Phone: [phone]   Fax:  [phone]  Name: Myrlene Riera MRN: [id number] Date of Birth: 06-23-1933

## 2016-12-20 DIAGNOSIS — I10 Essential (primary) hypertension: Secondary | ICD-10-CM | POA: Diagnosis not present

## 2016-12-20 DIAGNOSIS — Z7901 Long term (current) use of anticoagulants: Secondary | ICD-10-CM | POA: Diagnosis not present

## 2016-12-20 DIAGNOSIS — E784 Other hyperlipidemia: Secondary | ICD-10-CM | POA: Diagnosis not present

## 2016-12-20 DIAGNOSIS — Z23 Encounter for immunization: Secondary | ICD-10-CM | POA: Diagnosis not present

## 2016-12-20 DIAGNOSIS — R2681 Unsteadiness on feet: Secondary | ICD-10-CM | POA: Diagnosis not present

## 2016-12-20 DIAGNOSIS — I480 Paroxysmal atrial fibrillation: Secondary | ICD-10-CM | POA: Diagnosis not present

## 2016-12-20 DIAGNOSIS — R413 Other amnesia: Secondary | ICD-10-CM | POA: Diagnosis not present

## 2016-12-20 DIAGNOSIS — G4733 Obstructive sleep apnea (adult) (pediatric): Secondary | ICD-10-CM | POA: Diagnosis not present

## 2016-12-20 DIAGNOSIS — Z6828 Body mass index (BMI) 28.0-28.9, adult: Secondary | ICD-10-CM | POA: Diagnosis not present

## 2016-12-21 ENCOUNTER — Ambulatory Visit: Payer: Medicare Other | Admitting: Physical Therapy

## 2016-12-21 DIAGNOSIS — M6281 Muscle weakness (generalized): Secondary | ICD-10-CM

## 2016-12-21 DIAGNOSIS — R293 Abnormal posture: Secondary | ICD-10-CM

## 2016-12-21 DIAGNOSIS — R2689 Other abnormalities of gait and mobility: Secondary | ICD-10-CM | POA: Diagnosis not present

## 2016-12-21 NOTE — Therapy (Signed)
[facility] [address], [place], [place], [address] Phone: [phone]   Fax:  [phone]  Physical Therapy Treatment  Patient Details  Name: [name] MRN: [id number] Date of Birth: Jul 05, 1933 Referring Provider: Leanna Battles, MD  Encounter Date: 12/21/2016      PT End of Session - 12/21/16 1508    Visit Number 19   Number of Visits 28   Date for PT Re-Evaluation 12/31/16   Authorization Type G code at visit 28;; KX at visit 15   PT Start Time 1446   PT Stop Time 1526   PT Time Calculation (min) 40 min   Activity Tolerance Patient tolerated treatment well      Past Medical History:  Diagnosis Date  . Arthritis    "fingers, hips, feet, ankles" (11/10/2012)  . Asthma   . Atrial fibrillation (HCC)    CHRONIC COUMADIN  . Breast cancer ([place]) 1990's   "cancer on one side; precancerous tissue on the other" (11/10/2012)  . Chronic bronchitis ([place] [place])    "multiple times; not in a long time" (11/10/2012  . Depression   . GERD (gastroesophageal reflux disease)   . Hearing impaired   . Hypertension   . Irritable bowel   . Lymphedema    HX OF - IN LEFT ARM--NO NEEDLES OR B/P'S LEFT ARM  . Macular degeneration    "BEGINNINGS OF" MACULAR DEGENERATION  . Osteoporosis   . Pneumonia    "multiple times; not in a long time" (11/10/2012)  . Recurrent UTI   . Sleep apnea    "dx'd w/it; don't wear mask or anything" (11/10/2012)  . Stroke ([place]) ~ 2005   HX OF TIA-NO RESIDUAL PROBLEM--PARALYSIS RT SIDE FACE /PT'S MOUTH DROOPS-AND LOSS OF HEARING RT EAR --SINCE EAR SURGERY AS A CHILD   . UTI (lower urinary tract infection)    FREQUENT    Past Surgical History:  Procedure Laterality Date  . BREAST BIOPSY Bilateral 1990's  . Dacryocystorhinostomy  02/18/2016  . INNER EAR SURGERY Right    MULTIPLE EAR SURGERIES,  . JOINT REPLACEMENT    . KNEE ARTHROSCOPY  04/07/2012   Procedure: ARTHROSCOPY KNEE;  Surgeon: Gearlean Alf, MD;  Location: [facility];  Service: Orthopedics;  Laterality: Left;  WITH SYNOVECTOMY  . MASTECTOMY Bilateral 1990's  . MASTOIDECTOMY Right 1942  . TONSILLECTOMY    . TOTAL KNEE ARTHROPLASTY  09/20/2011   LEFT TOTAL KNEE ARTHROPLASTY;  Surgeon: Gearlean Alf, MD;  Location: [facility];  Service: Orthopedics;  Laterality: Left;  . TOTAL KNEE ARTHROPLASTY  2006    There were no vitals filed for this visit.      Subjective Assessment - 12/21/16 1446    Subjective Feeling OK.  "A lot better."  Presents without her cane.  States, "sometimes (my balance) is good, sometimes bad."     Currently in Pain? No/denies   Multiple Pain Sites No                         [facility] Adult PT Treatment/Exercise - 12/21/16 0001      Therapeutic Activites    Other Therapeutic Activities partial squatting; reaching,sit to stand     Neuro Re-ed    Neuro Re-ed Details  dynamic balance reaching in different planes     Knee/Hip Exercises: Stretches   Active Hamstring Stretch Right;Left;3 reps;10 seconds   Active Hamstring Stretch Limitations on 2nd step   Hip  Flexor Stretch Limitations 5x on 2nd step     Knee/Hip Exercises: Aerobic   Nustep L2 10 min      Knee/Hip Exercises: Standing   Forward Step Up Right;Left;1 set;Hand Hold: 2;Step Height: 6"   Forward Step Up Limitations 2x5   Functional Squat 5 reps   Other Standing Knee Exercises up and down 4 steps      Knee/Hip Exercises: Seated   Long Arc Quad Strengthening;Right;Left;10 reps   Long Arc Quad Limitations red band   Heel Slides Limitations red band ankle eversion 10x   Knee/Hip Flexion red band 10x bil   Other Seated Knee/Hip Exercises foam roll push down 10x   Sit to [employer] 5 reps                  PT Short Term Goals - 12/21/16 1517      PT SHORT TERM GOAL #1   Title be independent in initial HEP   Status Achieved     PT SHORT TERM GOAL #2   Title verbalize and demonstrate  understanding of fall prevention   Status Achieved     PT SHORT TERM GOAL #3   Title perform 5x sit to stand in < or = to 30 seconds to reduce falls risk   Status Achieved     PT SHORT TERM GOAL #4   Title perform TUG in < or = to 23 seconds to reduce falls risk   Status Achieved           PT Long Term Goals - 12/21/16 1517      PT LONG TERM GOAL #1   Title be independent in advanced HEP   Time 8   Period Weeks   Status On-going     PT LONG TERM GOAL #2   Title improve LE strength to demonstrate stand to sit with controlled descent   Time 8   Period Weeks   Status On-going     PT LONG TERM GOAL #3   Title perform 5x sit to stand in < or = to 25 seconds   Status Achieved     PT LONG TERM GOAL #4   Title perform TUG in < or = to 20 seconds to reduce falls risk   Status Achieved     PT LONG TERM GOAL #5   Title BERG balance score improved from 31/56 to 36/56 indicating decreased risk of falls with ADLs   Time 6   Period Weeks   Status On-going               Plan - 12/21/16 1513    Clinical Impression Statement The patient is able to participate in 15 min of continuous standing exercise.  Short shuffled steps at times does increase her risk for falls.   She reports good compliance with her HEP.  [facility].  Discussed other community options including [employer].   Recommend continued use of cane for safety.     Rehab Potential Good   PT Frequency 2x / week   PT Duration 8 weeks   PT Treatment/Interventions ADLs/Self Care Home Management;Functional mobility training;Stair training;Gait training;Therapeutic activities;Therapeutic exercise;Neuromuscular re-education;Patient/family education;Manual techniques;Taping   PT Next Visit Plan Strength, balance, gait velocity;  squatting;  step downs from a curb;  stairs ;  KX modifiers      Patient will benefit from skilled therapeutic  intervention in order to  improve the following deficits and impairments:  Abnormal gait, Decreased activity tolerance, Decreased balance, Decreased strength, Postural dysfunction, Improper body mechanics, Impaired flexibility, Decreased safety awareness, Decreased endurance, Difficulty walking  Visit Diagnosis: Other abnormalities of gait and mobility  Abnormal posture  Muscle weakness (generalized)     Problem List Patient Active Problem List   Diagnosis Date Noted  . Anemia 11/10/2012    Class: Chronic  . Syncope 11/09/2012  . Urinary tract infection, recurrent 11/09/2012  . Hyponatremia 11/09/2012  . Diarrhea 11/09/2012  . Paroxysmal atrial fibrillation ([place]) 11/09/2012  . Hypertension 11/09/2012  . Synovitis of knee 04/07/2012  . Postop Hypokalemia 09/23/2011  . Postop Acute blood loss anemia 09/21/2011  . Postop Transfusion 09/21/2011  . OA (osteoarthritis) of knee 09/20/2011   Ruben Im, PT 12/21/16 3:19 PM Phone: [phone] Fax: [phone]  Alvera Singh 12/21/2016, 3:18 PM  [facility] [address] [place], [place], [address] Phone: [phone]   Fax:  [phone]  Name: [name] MRN: [id number] Date of Birth: 12-Jun-1933

## 2016-12-24 ENCOUNTER — Ambulatory Visit: Payer: Medicare Other | Admitting: Physical Therapy

## 2016-12-24 DIAGNOSIS — M6281 Muscle weakness (generalized): Secondary | ICD-10-CM | POA: Diagnosis not present

## 2016-12-24 DIAGNOSIS — R293 Abnormal posture: Secondary | ICD-10-CM | POA: Diagnosis not present

## 2016-12-24 DIAGNOSIS — K589 Irritable bowel syndrome without diarrhea: Secondary | ICD-10-CM | POA: Diagnosis not present

## 2016-12-24 DIAGNOSIS — R2689 Other abnormalities of gait and mobility: Secondary | ICD-10-CM | POA: Diagnosis not present

## 2016-12-24 DIAGNOSIS — K591 Functional diarrhea: Secondary | ICD-10-CM | POA: Diagnosis not present

## 2016-12-24 NOTE — Therapy (Signed)
[facility] [address] [place], [place], [address] Phone: [phone]   Fax:  [phone]  Physical Therapy Treatment  Patient Details  Name: [name] MRN: [id number] Date of Birth: January 15, 1934 Referring Provider: Leanna Battles, MD  Encounter Date: 12/24/2016      PT End of Session - 12/24/16 1147    Visit Number 20   Number of Visits 28   Date for PT Re-Evaluation 12/31/16   Authorization Type G code at visit 28;; KX at visit 15   PT Start Time 1106   PT Stop Time 1147   PT Time Calculation (min) 41 min   Activity Tolerance Patient tolerated treatment well      Past Medical History:  Diagnosis Date  . Arthritis    "fingers, hips, feet, ankles" (11/10/2012)  . Asthma   . Atrial fibrillation (HCC)    CHRONIC COUMADIN  . Breast cancer ([place]) 1990's   "cancer on one side; precancerous tissue on the other" (11/10/2012)  . Chronic bronchitis ([place])    "multiple times; not in a long time" (11/10/2012  . Depression   . GERD (gastroesophageal reflux disease)   . Hearing impaired   . Hypertension   . Irritable bowel   . Lymphedema    HX OF - IN LEFT ARM--NO NEEDLES OR B/P'S LEFT ARM  . Macular degeneration    "BEGINNINGS OF" MACULAR DEGENERATION  . Osteoporosis   . Pneumonia    "multiple times; not in a long time" (11/10/2012)  . Recurrent UTI   . Sleep apnea    "dx'd w/it; don't wear mask or anything" (11/10/2012)  . 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 UTI (lower urinary tract infection)    FREQUENT    Past Surgical History:  Procedure Laterality Date  . BREAST BIOPSY Bilateral 1990's  . Dacryocystorhinostomy  02/18/2016  . INNER EAR SURGERY Right    MULTIPLE EAR SURGERIES,  . JOINT REPLACEMENT    . KNEE ARTHROSCOPY  04/07/2012   Procedure: ARTHROSCOPY KNEE;  Surgeon: Gearlean Alf, MD;  Location: [facility];  Service: Orthopedics;  Laterality: Left;  WITH SYNOVECTOMY  . MASTECTOMY Bilateral 1990's  . MASTOIDECTOMY Right 1942  . TONSILLECTOMY    . TOTAL KNEE ARTHROPLASTY  09/20/2011   LEFT TOTAL KNEE ARTHROPLASTY;  Surgeon: Gearlean Alf, MD;  Location: [facility];  Service: Orthopedics;  Laterality: Left;  . TOTAL KNEE ARTHROPLASTY  2006    There were no vitals filed for this visit.      Subjective Assessment - 12/24/16 1104    Subjective Patient reports no problems.  Using cane today.     Currently in Pain? No/denies   Multiple Pain Sites No                         [facility] Adult PT Treatment/Exercise - 12/24/16 0001      Ambulation/Gait   Gait Comments 160 feet with cane with postural cues and toes forward     Therapeutic Activites    Other Therapeutic Activities partial squatting; reaching,sit to stand     Neuro Re-ed    Neuro Re-ed Details  dynamic balance reaching in different planes; tandem standing and normal stance     Exercises   Exercises Other Exercises   Other Exercises  --  stepping around obstacles maintaining gait speed with head turns, stepping over object     Knee/Hip Exercises: Standing   Forward Step Up Right;Left;1 set;Hand Hold: 2;Step Height: 6"   Forward Step Up Limitations 2x5   Other Standing Knee Exercises up and down 4 steps                   PT Short Term Goals - 12/24/16 1201      PT SHORT TERM GOAL #1   Title be independent in initial HEP   Status Achieved     PT SHORT TERM GOAL #2   Title verbalize and demonstrate understanding of fall prevention   Status Achieved     PT SHORT TERM GOAL #3   Title perform 5x sit to stand in < or = to 30 seconds to reduce falls risk   Status Achieved     PT SHORT TERM GOAL #4   Title perform TUG in < or = to 23 seconds to reduce falls risk   Status Achieved           PT Long Term Goals - 12/24/16 1201      PT LONG TERM  GOAL #1   Title be independent in advanced HEP   Time 8   Period Weeks   Status On-going     PT LONG TERM GOAL #2   Title improve LE strength to demonstrate stand to sit with controlled descent   Time 8   Period Weeks   Status On-going     PT LONG TERM GOAL #3   Title perform 5x sit to stand in < or = to 25 seconds   Status Achieved     PT LONG TERM GOAL #4   Title perform TUG in < or = to 20 seconds to reduce falls risk   Status Achieved     PT LONG TERM GOAL #5   Title BERG balance score improved from 31/56 to 36/56 indicating decreased risk of falls with ADLs   Time 6   Period Weeks   Status On-going               Plan - 12/24/16 1149    Clinical Impression Statement Difficulty with narrow base of support and tasks which require single limb standing  like stepping over an obstacle on the floor.   She has improved tolerance for standing longer periods of time.  Recommend continued use of cane for safety.  She moves slowly with sit to stand and other transitional movements secondary to reports of dizziness.     Rehab Potential Good   PT Frequency 2x / week   PT Duration 8 weeks   PT Treatment/Interventions ADLs/Self Care Home Management;Functional mobility training;Stair training;Gait training;Therapeutic activities;Therapeutic exercise;Neuromuscular re-education;Patient/family education;Manual techniques;Taping   PT Next Visit Plan Strength, balance, gait velocity;  squatting;  step downs from a curb;  stairs ;  KX modifiers      Patient will benefit from skilled therapeutic intervention in order to improve the following deficits and impairments:  Abnormal gait, Decreased activity tolerance, Decreased balance, Decreased strength, Postural dysfunction, Improper body mechanics, Impaired flexibility, Decreased safety awareness, Decreased endurance, Difficulty walking  Visit Diagnosis: Other abnormalities of gait and mobility  Abnormal posture  Muscle weakness  (generalized)     Problem List Patient Active Problem List   Diagnosis Date Noted  . Anemia 11/10/2012    Class: Chronic  . Syncope 11/09/2012  . Urinary tract infection, recurrent 11/09/2012  .  Hyponatremia 11/09/2012  . Diarrhea 11/09/2012  . Paroxysmal atrial fibrillation ([place]) 11/09/2012  . Hypertension 11/09/2012  . Synovitis of knee 04/07/2012  . Postop Hypokalemia 09/23/2011  . Postop Acute blood loss anemia 09/21/2011  . Postop Transfusion 09/21/2011  . OA (osteoarthritis) of knee 09/20/2011   Ruben Im, PT 12/24/16 12:04 PM Phone: [phone] Fax: [phone]  Alvera Singh 12/24/2016, 12:02 PM  [facility] [address], [place], [place], [address] Phone: [phone]   Fax:  [phone]  Name: [name] MRN: [id number] Date of Birth: Jul 03, 1933

## 2016-12-27 DIAGNOSIS — L821 Other seborrheic keratosis: Secondary | ICD-10-CM | POA: Diagnosis not present

## 2016-12-27 DIAGNOSIS — L309 Dermatitis, unspecified: Secondary | ICD-10-CM | POA: Diagnosis not present

## 2016-12-28 ENCOUNTER — Ambulatory Visit: Payer: Medicare Other | Attending: Internal Medicine | Admitting: Physical Therapy

## 2016-12-28 DIAGNOSIS — R293 Abnormal posture: Secondary | ICD-10-CM | POA: Diagnosis not present

## 2016-12-28 DIAGNOSIS — R2689 Other abnormalities of gait and mobility: Secondary | ICD-10-CM | POA: Diagnosis not present

## 2016-12-28 DIAGNOSIS — M6281 Muscle weakness (generalized): Secondary | ICD-10-CM | POA: Insufficient documentation

## 2016-12-28 DIAGNOSIS — N3000 Acute cystitis without hematuria: Secondary | ICD-10-CM | POA: Diagnosis not present

## 2016-12-28 NOTE — Therapy (Signed)
[facility] [address] [place], [place], [address] Phone: [phone]   Fax:  [phone]  Physical Therapy Treatment  Patient Details  Name: [name] MRN: [id number] Date of Birth: 10-19-1933 Referring Provider: Leanna Battles, MD  Encounter Date: 12/28/2016      PT End of Session - 12/28/16 1434    Visit Number 21   Number of Visits 28   Date for PT Re-Evaluation 12/31/16   Authorization Type G code at visit 28;; KX at visit 15   PT Start Time 1411   PT Stop Time 1450   PT Time Calculation (min) 39 min   Activity Tolerance Patient tolerated treatment well      Past Medical History:  Diagnosis Date  . Arthritis    "fingers, hips, feet, ankles" (11/10/2012)  . Asthma   . Atrial fibrillation (HCC)    CHRONIC COUMADIN  . Breast cancer ([place]) 1990's   "cancer on one side; precancerous tissue on the other" (11/10/2012)  . Chronic bronchitis ([place])    "multiple times; not in a long time" (11/10/2012  . Depression   . GERD (gastroesophageal reflux disease)   . Hearing impaired   . Hypertension   . Irritable bowel   . Lymphedema    HX OF - IN LEFT ARM--NO NEEDLES OR B/P'S LEFT ARM  . Macular degeneration    "BEGINNINGS OF" MACULAR DEGENERATION  . Osteoporosis   . Pneumonia    "multiple times; not in a long time" (11/10/2012)  . Recurrent UTI   . Sleep apnea    "dx'd w/it; don't wear mask or anything" (11/10/2012)  . Stroke ([place]) ~ 2005   HX OF TIA-NO RESIDUAL PROBLEM--PARALYSIS RT SIDE FACE /PT'S MOUTH DROOPS-AND LOSS OF HEARING RT EAR --SINCE EAR SURGERY AS A CHILD   . UTI (lower urinary tract infection)    FREQUENT    Past Surgical History:  Procedure Laterality Date  . BREAST BIOPSY Bilateral 1990's  . Dacryocystorhinostomy  02/18/2016  . INNER EAR SURGERY Right    MULTIPLE EAR SURGERIES,  . JOINT REPLACEMENT    . KNEE ARTHROSCOPY  04/07/2012   Procedure: ARTHROSCOPY KNEE;  Surgeon: Gearlean Alf, MD;  Location: [facility];  Service: Orthopedics;  Laterality: Left;  WITH SYNOVECTOMY  . MASTECTOMY Bilateral 1990's  . MASTOIDECTOMY Right 1942  . TONSILLECTOMY    . TOTAL KNEE ARTHROPLASTY  09/20/2011   LEFT TOTAL KNEE ARTHROPLASTY;  Surgeon: Gearlean Alf, MD;  Location: [facility];  Service: Orthopedics;  Laterality: Left;  . TOTAL KNEE ARTHROPLASTY  2006    There were no vitals filed for this visit.      Subjective Assessment - 12/28/16 1410    Subjective Patient arrives 11 min late.  She states she has another (urinary) infection.  Reports she was tired after working on balance last session.     Pertinent History bil. TKA, breast cancer, osteoporosis   Patient Stated Goals improve balance, reduce falls risk, improve strength   Currently in Pain? No/denies   Multiple Pain Sites No                         [facility] Adult PT Treatment/Exercise - 12/28/16 0001      Therapeutic Activites    Other Therapeutic Activities  reaching,sit to stand,  stairs     Neuro Re-ed    Neuro Re-ed Details  dynamic balance challenges  Knee/Hip Exercises: Stretches   Active Hamstring Stretch Right;Left;3 reps;30 seconds   Active Hamstring Stretch Limitations seated with stool  also with ankle pumps on stool     Knee/Hip Exercises: Aerobic   Nustep L2 8 min      Knee/Hip Exercises: Standing   Forward Step Up Right;Left;1 set;Hand Hold: 2;Step Height: 6"   Forward Step Up Limitations 2x5   Other Standing Knee Exercises beach ball toss with various stance positions and cognitive challenges   Other Standing Knee Exercises up and down 4 steps 2x     Knee/Hip Exercises: Seated   Long Arc Quad Strengthening;Right;Left;10 reps   Long Arc Quad Limitations red band   Heel Slides Limitations red band ankle eversion 10x   Knee/Hip Flexion red band 10x bil   Other Seated Knee/Hip Exercises foam roll push down 10x   Sit to [employer] 2 sets;5 reps     Shoulder  Exercises: Seated   Other Seated Exercises thoracic extension over foam roll 10x                  PT Short Term Goals - 12/28/16 1459      PT SHORT TERM GOAL #1   Title be independent in initial HEP   Status Achieved     PT SHORT TERM GOAL #2   Title verbalize and demonstrate understanding of fall prevention   Status Achieved     PT SHORT TERM GOAL #3   Title perform 5x sit to stand in < or = to 30 seconds to reduce falls risk   Status Achieved     PT SHORT TERM GOAL #4   Title perform TUG in < or = to 23 seconds to reduce falls risk   Status Achieved           PT Long Term Goals - 12/28/16 1459      PT LONG TERM GOAL #1   Title be independent in advanced HEP   Time 8   Period Weeks   Status On-going     PT LONG TERM GOAL #2   Title improve LE strength to demonstrate stand to sit with controlled descent   Time 8   Period Weeks   Status On-going     PT LONG TERM GOAL #3   Title perform 5x sit to stand in < or = to 25 seconds   Status Achieved     PT LONG TERM GOAL #4   Title perform TUG in < or = to 20 seconds to reduce falls risk   Status Achieved     PT LONG TERM GOAL #5   Title BERG balance score improved from 31/56 to 36/56 indicating decreased risk of falls with ADLs   Time 6   Period Weeks   Status On-going               Plan - 12/28/16 1455    Clinical Impression Statement Patient not feeling well today secondary to reports of a recurrent UTI.  She requires multiple seated rest breaks between standing ex.   On episode of loss of balance with narrow base of support with ball toss requiring therapist assist.  She has been using her SPC more regularly as previously requested.  Discussed the importance of continuation of HEP upon discharge and community options.     Rehab Potential Good   PT Frequency 2x / week   PT Duration 8 weeks   PT Treatment/Interventions ADLs/Self Care Home Management;Functional mobility training;Stair  training;Gait training;Therapeutic activities;Therapeutic exercise;Neuromuscular re-education;Patient/family education;Manual techniques;Taping   PT Next Visit Plan reassess progress toward goals, BERG;  TUG test; Strength, balance, gait velocity;  squatting;  step downs from a curb;  stairs ;  KX modifiers      Patient will benefit from skilled therapeutic intervention in order to improve the following deficits and impairments:  Abnormal gait, Decreased activity tolerance, Decreased balance, Decreased strength, Postural dysfunction, Improper body mechanics, Impaired flexibility, Decreased safety awareness, Decreased endurance, Difficulty walking  Visit Diagnosis: Other abnormalities of gait and mobility  Abnormal posture  Muscle weakness (generalized)     Problem List Patient Active Problem List   Diagnosis Date Noted  . Anemia 11/10/2012    Class: Chronic  . Syncope 11/09/2012  . Urinary tract infection, recurrent 11/09/2012  . Hyponatremia 11/09/2012  . Diarrhea 11/09/2012  . Paroxysmal atrial fibrillation ([place]) 11/09/2012  . Hypertension 11/09/2012  . Synovitis of knee 04/07/2012  . Postop Hypokalemia 09/23/2011  . Postop Acute blood loss anemia 09/21/2011  . Postop Transfusion 09/21/2011  . OA (osteoarthritis) of knee 09/20/2011   Ruben Im, PT 12/28/16 3:01 PM Phone: [phone] Fax: [phone]  Alvera Singh 12/28/2016, 3:00 PM  [facility] [address] [place], [place], [address] Phone: [phone]   Fax:  [phone]  Name: [name] MRN: [id number] Date of Birth: 29-Jul-1933

## 2016-12-30 DIAGNOSIS — N390 Urinary tract infection, site not specified: Secondary | ICD-10-CM | POA: Diagnosis not present

## 2016-12-31 ENCOUNTER — Ambulatory Visit: Payer: Medicare Other | Admitting: Physical Therapy

## 2016-12-31 DIAGNOSIS — R293 Abnormal posture: Secondary | ICD-10-CM

## 2016-12-31 DIAGNOSIS — R2689 Other abnormalities of gait and mobility: Secondary | ICD-10-CM

## 2016-12-31 DIAGNOSIS — M6281 Muscle weakness (generalized): Secondary | ICD-10-CM

## 2016-12-31 NOTE — Patient Instructions (Signed)
  [employer]   [address]

## 2016-12-31 NOTE — Therapy (Signed)
[facility] [address], [place], [place], [address] Phone: [phone]   Fax:  [phone]  Physical Therapy Treatment/Discharge Summary  Patient Details  Name: [name] MRN: [id number] Date of Birth: [birth year] Referring Provider: Leanna Battles, MD  Encounter Date: 12/31/2016      PT End of Session - 12/31/16 1150    Visit Number 22   Number of Visits 28   Date for PT Re-Evaluation 12/31/16   Authorization Type G code at visit 28;; KX at visit 15   PT Start Time 1103   PT Stop Time 1145   PT Time Calculation (min) 42 min   Activity Tolerance Patient tolerated treatment well      Past Medical History:  Diagnosis Date  . Arthritis    "fingers, hips, feet, ankles" (11/10/2012)  . Asthma   . Atrial fibrillation (HCC)    CHRONIC COUMADIN  . Breast cancer ([place]) 1990's   "cancer on one side; precancerous tissue on the other" (11/10/2012)  . Chronic bronchitis ([place])    "multiple times; not in a long time" (11/10/2012  . Depression   . GERD (gastroesophageal reflux disease)   . Hearing impaired   . Hypertension   . Irritable bowel   . Lymphedema    HX OF - IN LEFT ARM--NO NEEDLES OR B/P'S LEFT ARM  . Macular degeneration    "BEGINNINGS OF" MACULAR DEGENERATION  . Osteoporosis   . Pneumonia    "multiple times; not in a long time" (11/10/2012)  . Recurrent UTI   . Sleep apnea    "dx'd w/it; don't wear mask or anything" (11/10/2012)  . Stroke ([place]) ~ 2005   HX OF TIA-NO RESIDUAL PROBLEM--PARALYSIS RT SIDE FACE /PT'S MOUTH DROOPS-AND LOSS OF HEARING RT EAR --SINCE EAR SURGERY AS A CHILD   . UTI (lower urinary tract infection)    FREQUENT    Past Surgical History:  Procedure Laterality Date  . BREAST BIOPSY Bilateral 1990's  . Dacryocystorhinostomy  02/18/2016  . INNER EAR SURGERY Right    MULTIPLE EAR SURGERIES,  . JOINT REPLACEMENT    . KNEE ARTHROSCOPY  04/07/2012   Procedure: ARTHROSCOPY KNEE;   Surgeon: Gearlean Alf, MD;  Location: [facility];  Service: Orthopedics;  Laterality: Left;  WITH SYNOVECTOMY  . MASTECTOMY Bilateral 1990's  . MASTOIDECTOMY Right 1942  . TONSILLECTOMY    . TOTAL KNEE ARTHROPLASTY  09/20/2011   LEFT TOTAL KNEE ARTHROPLASTY;  Surgeon: Gearlean Alf, MD;  Location: [facility];  Service: Orthopedics;  Laterality: Left;  . TOTAL KNEE ARTHROPLASTY  2006    There were no vitals filed for this visit.      Subjective Assessment - 12/31/16 1107    Subjective Patient states she is not feeling any better b/c of her UTI.  Reports the drug store is out of the injectable antibiotic.  Overall she feels she is getting up and down better from the chair as well as up and down curbs and steps.     Pertinent History bil. TKA, breast cancer, osteoporosis   How long can you walk comfortably? if shopping and using a cart- up to 1 hour   Patient Stated Goals improve balance, reduce falls risk, improve strength   Currently in Pain? No/denies   Multiple Pain Sites No            [facility] PT Assessment - 12/31/16 0001      Strength   Overall Strength Comments grossly  4/5 to 4+/5 throughout     Standardized Balance Assessment   Standardized Balance Assessment --  18.2  2 railings reciprocal   Five times sit to stand comments  --  20.9     Berg Balance Test   Sit to Stand Able to stand  independently using hands   Standing Unsupported Able to stand safely 2 minutes   Sitting with Back Unsupported but Feet Supported on Floor or Stool Able to sit safely and securely 2 minutes   Stand to Sit Sits safely with minimal use of hands   Transfers Able to transfer safely, minor use of hands   Standing Unsupported with Eyes Closed Able to stand 10 seconds with supervision   Standing Ubsupported with Feet Together Able to place feet together independently but unable to hold for 30 seconds   From Standing, Reach Forward with Outstretched Arm Can reach forward >12 cm  safely (5")   From Standing Position, Pick up Object from Floor Able to pick up shoe, needs supervision   From Standing Position, Turn to Look Behind Over each Shoulder Looks behind one side only/other side shows less weight shift   Turn 360 Degrees Needs close supervision or verbal cueing   Standing Unsupported, Alternately Place Feet on Step/Stool Needs assistance to keep from falling or unable to try   Standing Unsupported, One Foot in Front Needs help to step but can hold 15 seconds   Standing on One Leg Tries to lift leg/unable to hold 3 seconds but remains standing independently   Total Score 36     Timed Up and Go Test   Normal TUG (seconds) 19   TUG Comments no cane                     [facility] Adult PT Treatment/Exercise - 12/31/16 0001      Therapeutic Activites    Other Therapeutic Activities  reaching,sit to stand,  stairs     Neuro Re-ed    Neuro Re-ed Details  dynamic balance challenges     Knee/Hip Exercises: Aerobic   Nustep L1 5 min                  PT Short Term Goals - 12/31/16 1128      PT SHORT TERM GOAL #1   Title be independent in initial HEP   Status Achieved     PT SHORT TERM GOAL #2   Title verbalize and demonstrate understanding of fall prevention   Status Achieved     PT SHORT TERM GOAL #3   Title perform 5x sit to stand in < or = to 30 seconds to reduce falls risk   Status Achieved     PT SHORT TERM GOAL #4   Title perform TUG in < or = to 23 seconds to reduce falls risk   Status Achieved           PT Long Term Goals - 12/31/16 1126      PT LONG TERM GOAL #2   Title improve LE strength to demonstrate stand to sit with controlled descent   Status Achieved     PT LONG TERM GOAL #3   Title perform 5x sit to stand in < or = to 25 seconds   Status Achieved     PT LONG TERM GOAL #4   Title perform TUG in < or = to 20 seconds to reduce falls risk   Status Achieved  PT LONG TERM GOAL #5   Title BERG balance  score improved from 31/56 to 36/56 indicating decreased risk of falls with ADLs   Status Achieved               Plan - 2017/01/17 1151    Clinical Impression Statement Despite a recurrent UTI, the patient has made significant improvements in BERG balance score, timed up and go and sit to stand tests.  Improved agility and confidence with ascending and descending steps.  She does have decreased balance with narrow base of support balance and therefore she is at moderate risk of falls.  Recommend continued use of a cane although patient states she forgot it today.  The majority of rehab goals have been met and the patient is in agreement of discharge from PT at this time.  We have discussed the importance of continuation of a HEP and community options available.        Patient will benefit from skilled therapeutic intervention in order to improve the following deficits and impairments:  Abnormal gait, Decreased activity tolerance, Decreased balance, Decreased strength, Postural dysfunction, Improper body mechanics, Impaired flexibility, Decreased safety awareness, Decreased endurance, Difficulty walking  Visit Diagnosis: Other abnormalities of gait and mobility  Abnormal posture  Muscle weakness (generalized)       G-Codes - 01/17/17 1156    Functional Assessment Tool Used (Outpatient Only) 5x sit to stand, TUG    Functional Limitation Mobility: Walking and moving around   Mobility: Walking and Moving Around Goal Status [phone]) At least 40 percent but less than 60 percent impaired, limited or restricted   Mobility: Walking and Moving Around Discharge Status [phone]) At least 40 percent but less than 60 percent impaired, limited or restricted      Problem List Patient Active Problem List   Diagnosis Date Noted  . Anemia 11/10/2012    Class: Chronic  . Syncope 11/09/2012  . Urinary tract infection, recurrent 11/09/2012  . Hyponatremia 11/09/2012  . Diarrhea 11/09/2012  .  Paroxysmal atrial fibrillation ([place]) 11/09/2012  . Hypertension 11/09/2012  . Synovitis of knee 04/07/2012  . Postop Hypokalemia 09/23/2011  . Postop Acute blood loss anemia 09/21/2011  . Postop Transfusion 09/21/2011  . OA (osteoarthritis) of knee 09/20/2011   PHYSICAL THERAPY DISCHARGE SUMMARY  Visits from Start of Care: 22  Current functional level related to goals / functional outcomes: See clinical impressions above   Remaining deficits: As above   Education / Equipment: Comprehensive HEP Plan: Patient agrees to discharge.  Patient goals were met. Patient is being discharged due to meeting the stated rehab goals.  ?????    Ruben Im, PT 01/17/2017 12:00 PM Phone: [phone] Fax: [phone]   Alvera Singh January 17, 2017, 11:58 AM  [facility] [address] [place], [place], [address] Phone: [phone]   Fax:  [phone]  Name: Shanicka Oldenkamp MRN: [id number] Date of Birth: Jun 23, 1933

## 2017-01-09 ENCOUNTER — Emergency Department (HOSPITAL_COMMUNITY)
Admission: EM | Admit: 2017-01-09 | Discharge: 2017-01-09 | Disposition: A | Discharge status: Home / Self Care | Payer: Medicare Other | Attending: Emergency Medicine | Admitting: Emergency Medicine

## 2017-01-09 ENCOUNTER — Encounter (HOSPITAL_COMMUNITY): Payer: Self-pay | Admitting: Emergency Medicine

## 2017-01-09 ENCOUNTER — Emergency Department (HOSPITAL_COMMUNITY): Payer: Medicare Other

## 2017-01-09 DIAGNOSIS — Z91040 Latex allergy status: Secondary | ICD-10-CM | POA: Insufficient documentation

## 2017-01-09 DIAGNOSIS — Z79899 Other long term (current) drug therapy: Secondary | ICD-10-CM | POA: Diagnosis not present

## 2017-01-09 DIAGNOSIS — Z7901 Long term (current) use of anticoagulants: Secondary | ICD-10-CM | POA: Diagnosis not present

## 2017-01-09 DIAGNOSIS — J45909 Unspecified asthma, uncomplicated: Secondary | ICD-10-CM | POA: Diagnosis not present

## 2017-01-09 DIAGNOSIS — R42 Dizziness and giddiness: Secondary | ICD-10-CM | POA: Diagnosis not present

## 2017-01-09 DIAGNOSIS — R112 Nausea with vomiting, unspecified: Secondary | ICD-10-CM | POA: Diagnosis not present

## 2017-01-09 DIAGNOSIS — R9082 White matter disease, unspecified: Secondary | ICD-10-CM | POA: Diagnosis not present

## 2017-01-09 DIAGNOSIS — I10 Essential (primary) hypertension: Secondary | ICD-10-CM | POA: Diagnosis not present

## 2017-01-09 DIAGNOSIS — R270 Ataxia, unspecified: Secondary | ICD-10-CM | POA: Diagnosis not present

## 2017-01-09 DIAGNOSIS — I639 Cerebral infarction, unspecified: Secondary | ICD-10-CM | POA: Diagnosis not present

## 2017-01-09 LAB — URINALYSIS, ROUTINE W REFLEX MICROSCOPIC
Bilirubin Urine: NEGATIVE
Glucose, UA: NEGATIVE mg/dL
Hgb urine dipstick: NEGATIVE
Ketones, ur: NEGATIVE mg/dL
Nitrite: NEGATIVE
Protein, ur: NEGATIVE mg/dL
Specific Gravity, Urine: 1.01 (ref 1.005–1.030)
pH: 7 (ref 5.0–8.0)

## 2017-01-09 LAB — BASIC METABOLIC PANEL
Anion gap: 10 (ref 5–15)
BUN: 21 mg/dL — ABNORMAL HIGH (ref 6–20)
CO2: 28 mmol/L (ref 22–32)
Calcium: 9.5 mg/dL (ref 8.9–10.3)
Chloride: 98 mmol/L — ABNORMAL LOW (ref 101–111)
Creatinine, Ser: 1.27 mg/dL — ABNORMAL HIGH (ref 0.44–1.00)
GFR calc Af Amer: 45 mL/min — ABNORMAL LOW (ref >60)
GFR calc non Af Amer: 38 mL/min — ABNORMAL LOW (ref >60)
Glucose, Bld: 134 mg/dL — ABNORMAL HIGH (ref 65–99)
Potassium: 3.3 mmol/L — ABNORMAL LOW (ref 3.5–5.1)
Sodium: 136 mmol/L (ref 135–145)

## 2017-01-09 LAB — CBC
HCT: 30.2 % — ABNORMAL LOW (ref 36.0–46.0)
Hemoglobin: 9.6 g/dL — ABNORMAL LOW (ref 12.0–15.0)
MCH: 26.4 pg (ref 26.0–34.0)
MCHC: 31.8 g/dL (ref 30.0–36.0)
MCV: 83.2 fL (ref 78.0–100.0)
Platelets: 307 10*3/uL (ref 150–400)
RBC: 3.63 MIL/uL — ABNORMAL LOW (ref 3.87–5.11)
RDW: 14.3 % (ref 11.5–15.5)
WBC: 8 10*3/uL (ref 4.0–10.5)

## 2017-01-09 LAB — PROTIME-INR
INR: 3.75
Prothrombin Time: 36.8 seconds — ABNORMAL HIGH (ref 11.4–15.2)

## 2017-01-09 MED ORDER — ONDANSETRON 4 MG PO TBDP: 4.0000 mg | ORAL_TABLET | Freq: Three times a day (TID) | ORAL | 0 refills | Status: DC | PRN

## 2017-01-09 MED ORDER — MECLIZINE HCL 25 MG PO TABS
25.0000 mg | ORAL_TABLET | Freq: Once | ORAL | Status: AC
  Administered 2017-01-09: 25 mg via ORAL
  Filled 2017-01-09: qty 1

## 2017-01-09 MED ORDER — SODIUM CHLORIDE 0.9 % IV BOLUS (SEPSIS)
1000.0000 mL | Freq: Once | INTRAVENOUS | Status: AC
  Administered 2017-01-09: 1000 mL via INTRAVENOUS

## 2017-01-09 MED ORDER — POTASSIUM CHLORIDE CRYS ER 20 MEQ PO TBCR
40.0000 meq | EXTENDED_RELEASE_TABLET | Freq: Once | ORAL | Status: AC
  Administered 2017-01-09: 40 meq via ORAL
  Filled 2017-01-09: qty 2

## 2017-01-09 MED ORDER — DIAZEPAM 2 MG PO TABS
2.0000 mg | ORAL_TABLET | Freq: Once | ORAL | Status: AC
  Administered 2017-01-09: 2 mg via ORAL
  Filled 2017-01-09: qty 1

## 2017-01-09 MED ORDER — DIAZEPAM 2 MG PO TABS: 2.0000 mg | ORAL_TABLET | Freq: Three times a day (TID) | ORAL | 0 refills | Status: DC | PRN

## 2017-01-09 MED ORDER — ONDANSETRON HCL 4 MG/2ML IJ SOLN
4.0000 mg | Freq: Once | INTRAMUSCULAR | Status: AC
  Administered 2017-01-09: 4 mg via INTRAVENOUS
  Filled 2017-01-09: qty 2

## 2017-01-09 NOTE — ED Notes (Signed)
Ambulated pt in hallway, pt walked with cane. O2 sat was 99% starting at bedside, fell to 93% while ambulating, rose to 98% upon returning to bed. Pt stated no issues with dizziness during and after ambulating.

## 2017-01-09 NOTE — ED Triage Notes (Signed)
Pt. Stated, I started having nausea, vomiting, with some vertigo within the last 2 days. Im unable to eat. I feel very weak.

## 2017-01-09 NOTE — ED Provider Notes (Signed)
[facility] DEPT Provider Note   CSN: [id number] Arrival date & time: 01/09/17  1141     History   Chief Complaint Chief Complaint  Patient presents with  . Nausea  . Emesis  . Dizziness    HPI [name] is a 83 y.o. female.  HPI  83 year old female with a history of atrial fibrillation on chronic Coumadin presents with dizziness starting 2 days ago. Started after being in aloud movie theater with vibrating seats. She has been having dizziness whenever she sits or walks. If she lies completely flat she feels okay. She has been able to sleep adenosine she gets up in the morning the dizziness recurs. It feels like she is off balance. She denies any syncope/near syncope. No headaches. She does feel like her vision is a little blurry. Chest chronic neck pain but no new neck pain. Turning her head makes the symptoms worse as well. No weakness or numbness in her extremities. She has chronic right-sided facial droop from mastoiditis when she was a child. She has had nausea and significantly decreased fluid intake and oral intake due to this. She is concerned she's dehydrated.  She also is concerned about a urinary tract infection. For the past 4 weeks she's been having cloudy urine and multiple UTIs confirmed on urinalysis and culture. Most recently she has finished IM gentamicin given to her by her urologist. This was a couple days ago.  Past Medical History:  Diagnosis Date  . Arthritis    "fingers, hips, feet, ankles" (11/10/2012)  . Asthma   . Atrial fibrillation (HCC)    CHRONIC COUMADIN  . Breast cancer ([place]) 1990's   "cancer on one side; precancerous tissue on the other" (11/10/2012)  . Chronic bronchitis ([place])    "multiple times; not in a long time" (11/10/2012  . Depression   . GERD (gastroesophageal reflux disease)   . Hearing impaired   . Hypertension   . Irritable bowel   . Lymphedema    HX OF - IN LEFT ARM--NO NEEDLES OR B/P'S LEFT ARM  . Macular degeneration    "BEGINNINGS OF" MACULAR DEGENERATION  . Osteoporosis   . Pneumonia    "multiple times; not in a long time" (11/10/2012)  . Recurrent UTI   . Sleep apnea    "dx'd w/it; don't wear mask or anything" (11/10/2012)  . Stroke ([place]) ~ 2005   HX OF TIA-NO RESIDUAL PROBLEM--PARALYSIS RT SIDE FACE /PT'S MOUTH DROOPS-AND LOSS OF HEARING RT EAR --SINCE EAR SURGERY AS A CHILD   . UTI (lower urinary tract infection)    FREQUENT    Patient Active Problem List   Diagnosis Date Noted  . Anemia 11/10/2012    Class: Chronic  . Syncope 11/09/2012  . Urinary tract infection, recurrent 11/09/2012  . Hyponatremia 11/09/2012  . Diarrhea 11/09/2012  . Paroxysmal atrial fibrillation ([place]) 11/09/2012  . Hypertension 11/09/2012  . Synovitis of knee 04/07/2012  . Postop Hypokalemia 09/23/2011  . Postop Acute blood loss anemia 09/21/2011  . Postop Transfusion 09/21/2011  . OA (osteoarthritis) of knee 09/20/2011    Past Surgical History:  Procedure Laterality Date  . BREAST BIOPSY Bilateral 1990's  . Dacryocystorhinostomy  02/18/2016  . INNER EAR SURGERY Right    MULTIPLE EAR SURGERIES,  . JOINT REPLACEMENT    . KNEE ARTHROSCOPY  04/07/2012   Procedure: ARTHROSCOPY KNEE;  Surgeon: Gearlean Alf, MD;  Location: [facility];  Service: Orthopedics;  Laterality: Left;  WITH SYNOVECTOMY  . MASTECTOMY Bilateral  1990's  . MASTOIDECTOMY Right 1942  . TONSILLECTOMY    . TOTAL KNEE ARTHROPLASTY  09/20/2011   LEFT TOTAL KNEE ARTHROPLASTY;  Surgeon: Gearlean Alf, MD;  Location: [facility];  Service: Orthopedics;  Laterality: Left;  . TOTAL KNEE ARTHROPLASTY  2006    OB History    No data available       Home Medications    Prior to Admission medications   Medication Sig Start Date End Date Taking? Authorizing Provider  acetaminophen (TYLENOL) 500 MG tablet Take 500 mg by mouth at bedtime.   Yes [provider]  atorvastatin (LIPITOR) 20 MG tablet Take 20 mg by mouth daily.   Yes  [provider]  chlorthalidone (HYGROTON) 50 MG tablet Take 50 mg by mouth every morning.   Yes [provider]  CRANBERRY PO Take 1 capsule by mouth 2 (two) times daily.    Yes [provider]  estradiol (ESTRACE) 0.1 MG/GM vaginal cream Place 1 Applicatorful vaginally as directed. Twice a week   Yes [provider]  loperamide (IMODIUM) 2 MG capsule Take 2 mg by mouth as needed for diarrhea or loose stools.   Yes [provider]  losartan (COZAAR) 100 MG tablet Take 100 mg by mouth daily.   Yes [provider]  metoprolol succinate (TOPROL-XL) 25 MG 24 hr tablet Take 1 tablet (25 mg total) by mouth daily. Patient taking differently: Take 25 mg by mouth 2 (two) times daily.  11/10/12  Yes Leanna Battles, MD  mirabegron ER (MYRBETRIQ) 25 MG TB24 tablet Take 25 mg by mouth every evening.   Yes [provider]  Multiple Vitamin (MULTIVITAMIN) capsule Take 1 capsule by mouth daily.   Yes [provider]  Multiple Vitamins-Minerals (PRESERVISION/LUTEIN) CAPS Take 1 capsule by mouth daily.    Yes [provider]  pantoprazole (PROTONIX) 40 MG tablet Take 40 mg by mouth daily.   Yes [provider]  Polyethyl Glycol-Propyl Glycol (SYSTANE) 0.4-0.3 % GEL ophthalmic gel Place 1 application into both eyes at bedtime.   Yes [provider]  Polyethyl Glycol-Propyl Glycol (SYSTANE) 0.4-0.3 % SOLN Place 1 drop into both eyes at bedtime.    Yes [provider]  Probiotic Product (VSL#3 PO) Take 1 capsule by mouth 2 (two) times daily. Take 2 tablets by mouth twice daily    Yes [provider]  saccharomyces boulardii (FLORASTOR) 250 MG capsule Take 250 mg by mouth 2 (two) times daily.   Yes [provider]  warfarin (COUMADIN) 5 MG tablet Take 5-7.5 mg by mouth See admin instructions. 5mg  by mouth daily, except 7.5mg  on Saturdays 09/23/11  Yes Perkins, Alexzandrew L, PA-C  diazepam  (VALIUM) 2 MG tablet Take 1 tablet (2 mg total) by mouth every 8 (eight) hours as needed (dizziness). 01/09/17   Sherwood Gambler, MD  ondansetron (ZOFRAN ODT) 4 MG disintegrating tablet Take 1 tablet (4 mg total) by mouth every 8 (eight) hours as needed for nausea or vomiting. 01/09/17   Sherwood Gambler, MD    Family History No family history on file.  Social History Social History  Substance Use Topics  . Smoking status: Never Smoker  . Smokeless tobacco: Never Used  . Alcohol use No     Allergies   Sulfamethoxazole; Ampicillin; Clindamycin/lincomycin; Latex; Pradaxa [dabigatran etexilate mesylate]; and Ciprofloxacin   Review of Systems Review of Systems  Constitutional: Negative for fever.  Eyes: Positive for visual disturbance.  Respiratory: Negative for shortness of breath.  Cardiovascular: Negative for chest pain.  Gastrointestinal: Positive for nausea. Negative for abdominal pain and vomiting.  Genitourinary: Positive for dysuria.  Musculoskeletal: Positive for neck pain (chronic, unchanged).  Neurological: Positive for dizziness. Negative for weakness, numbness and headaches.  All other systems reviewed and are negative.    Physical Exam Updated Vital Signs BP (!) 168/65   Pulse (!) 54   Temp 98.1 F (36.7 C) (Oral)   Resp 16   Ht 5\' 4"  (1.626 m)   Wt 74.8 kg (165 lb)   SpO2 100%   BMI 28.32 kg/m   Physical Exam  Constitutional: She is oriented to person, place, and time. She appears well-developed and well-nourished.  HENT:  Head: Normocephalic and atraumatic.  Right Ear: External ear normal.  Left Ear: Tympanic membrane and external ear normal.  Nose: Nose normal.  Unable to see Right TM due to canal anatomy but no drainage or tenderness  Eyes: Pupils are equal, round, and reactive to light. EOM are normal. Right eye exhibits no discharge. Left eye exhibits no discharge.  Left beating nystagmus bilaterally  Neck: Neck supple.  Cardiovascular: Normal  rate, regular rhythm and normal heart sounds.   Pulmonary/Chest: Effort normal and breath sounds normal.  Abdominal: Soft. There is no tenderness.  Neurological: She is alert and oriented to person, place, and time.  Chronic right facial droop and right eyelid weakness. Otherwise, CN 3-12 grossly intact. 5/5 strength in all 4 extremities. Grossly normal sensation. Normal finger to nose.   Skin: Skin is warm and dry.  Nursing note and vitals reviewed.    ED Treatments / Results  Labs (all labs ordered are listed, but only abnormal results are displayed) Labs Reviewed  BASIC METABOLIC PANEL - Abnormal; Notable for the following:       Result Value   Potassium 3.3 (*)    Chloride 98 (*)    Glucose, Bld 134 (*)    BUN 21 (*)    Creatinine, Ser 1.27 (*)    GFR calc non Af Amer 38 (*)    GFR calc Af Amer 45 (*)    All other components within normal limits  CBC - Abnormal; Notable for the following:    RBC 3.63 (*)    Hemoglobin 9.6 (*)    HCT 30.2 (*)    All other components within normal limits  URINALYSIS, ROUTINE W REFLEX MICROSCOPIC - Abnormal; Notable for the following:    Leukocytes, UA MODERATE (*)    Bacteria, UA RARE (*)    Squamous Epithelial / LPF 0-5 (*)    All other components within normal limits  PROTIME-INR - Abnormal; Notable for the following:    Prothrombin Time 36.8 (*)    All other components within normal limits  URINE CULTURE  CBG MONITORING, ED    EKG  EKG Interpretation  Date/Time:  Sunday January 09 2017 11:50:11 EDT Ventricular Rate:  50 PR Interval:  178 QRS Duration: 86 QT Interval:  434 QTC Calculation: 395 R Axis:   28 Text Interpretation:  Sinus bradycardia Cannot rule out Anterior infarct , age undetermined Abnormal ECG no significant change since Sept 2017 Confirmed by Sherwood Gambler [phone]) on 01/09/2017 3:20:38 PM       Radiology Ct Head Wo Contrast  Result Date: 01/09/2017 CLINICAL DATA:  83 y/o  F; ataxia, stroke suspected.  EXAM: CT HEAD WITHOUT CONTRAST TECHNIQUE: Contiguous axial images were obtained from the base of the skull through the vertex without intravenous contrast. COMPARISON:  02/07/2016 MRI of the head.  09/15/2012 CT of the head. FINDINGS: Brain: No evidence of acute infarction, hemorrhage, hydrocephalus, extra-axial collection or mass lesion/mass effect. Small chronic cortical infarctions within the right parietal and bilateral occipital lobes. Left lateral cerebellar chronic infarction. Multiple small chronic lacunar infarctions within the bilateral basal ganglia most pronounced in the putamen and left thalamus. Stable advanced chronic microvascular ischemic changes of white matter and moderate brain parenchymal volume loss. Vascular: Calcific atherosclerosis of carotid siphons. No hyperdense vessel identified. Skull: Normal. Negative for fracture or focal lesion. Sinuses/Orbits: Aerosolized secretions within the right sphenoid sinus and mild right maxillary sinus mucosal thickening. Normal aeration of left mastoid air cells. Right-sided wall down mastoidectomy. Other: Bilateral intra-ocular lens replacement. IMPRESSION: 1. No acute intracranial abnormality identified. 2. Multiple small stable cortical infarctions, basal ganglia lacunar infarctions, and advanced chronic microvascular ischemic changes of white matter. 3. Mild right sphenoid and maxillary sinus paranasal sinus disease. Electronically Signed   By: Kristine Garbe M.D.   On: 01/09/2017 16:39   Mr Brain Wo Contrast  Result Date: 01/09/2017 CLINICAL DATA:  83 year old female with ataxia, vertigo, nausea vomiting. EXAM: MRI HEAD WITHOUT CONTRAST TECHNIQUE: Multiplanar, multiecho pulse sequences of the brain and surrounding structures were obtained without intravenous contrast. COMPARISON:  Head CT without contrast 1631 hours today. Brain MRI 02/07/2016. FINDINGS: The examination had to be discontinued prior to completion due to patient refusal  to continue. Only axial and coronal diffusion weighted imaging was obtained. No restricted diffusion or evidence of acute infarction. Chronic left cerebellar hemisphere encephalomalacia. Chronic T2 heterogeneity throughout the deep gray matter nuclei and cerebral white matter. Chronic right parietal lobe encephalomalacia. Stable cerebral volume. No intracranial mass effect or midline shift. Stable ventricle size and configuration. IMPRESSION: 1. No evidence of acute infarct.   Chronic ischemic disease. 2. The examination had to be discontinued prior to completion. Only diffusion-weighted imaging was obtained. Electronically Signed   By: Genevie Ann M.D.   On: 01/09/2017 17:10    Procedures Procedures (including critical care time)  Medications Ordered in ED Medications  sodium chloride 0.9 % bolus 1,000 mL (0 mLs Intravenous Stopped 01/09/17 1850)  meclizine (ANTIVERT) tablet 25 mg (25 mg Oral Given 01/09/17 1610)  potassium chloride SA (K-DUR,KLOR-CON) CR tablet 40 mEq (40 mEq Oral Given 01/09/17 1610)  diazepam (VALIUM) tablet 2 mg (2 mg Oral Given 01/09/17 1729)  ondansetron (ZOFRAN) injection 4 mg (4 mg Intravenous Given 01/09/17 1729)     Initial Impression / Assessment and Plan / ED Course  I have reviewed the triage vital signs and the nursing notes.  Pertinent labs & imaging results that were available during my care of the patient were reviewed by me and considered in my medical decision making (see chart for details).     Patient's presentation is c/w vertigo. labwork unremarkable save for mild hypokalemia and CKD that is stable. Given fluids, zofran, antivert. After valium her dizziness resolved. Counseled on potential side effects of valium and not mixing with ETOH, driving, etc. MRI without stroke, and while it was limited, I think CVA is less likely. No intra-cranial bleeding while on warfarin. Given she feels better, is able to ambulate, will d/c home. She has leukocytes in urine,  but this is now a chronic issue. Unclear if this is truly a UTI or some other bladder problem. F/u with her urologist. No ear pain or obvious ototoxicity, but gentamicin could be playing a role as well. F/u closely with PCP. Return precautions.  Final Clinical Impressions(s) / ED Diagnoses   Final diagnoses:  Vertigo    New Prescriptions New Prescriptions   DIAZEPAM (VALIUM) 2 MG TABLET    Take 1 tablet (2 mg total) by mouth every 8 (eight) hours as needed (dizziness).   ONDANSETRON (ZOFRAN ODT) 4 MG DISINTEGRATING TABLET    Take 1 tablet (4 mg total) by mouth every 8 (eight) hours as needed for nausea or vomiting.     Sherwood Gambler, MD 01/09/17 [phone]

## 2017-01-11 DIAGNOSIS — R296 Repeated falls: Secondary | ICD-10-CM | POA: Diagnosis not present

## 2017-01-11 DIAGNOSIS — G4733 Obstructive sleep apnea (adult) (pediatric): Secondary | ICD-10-CM | POA: Diagnosis not present

## 2017-01-11 DIAGNOSIS — M25562 Pain in left knee: Secondary | ICD-10-CM | POA: Diagnosis not present

## 2017-01-11 DIAGNOSIS — Z7901 Long term (current) use of anticoagulants: Secondary | ICD-10-CM | POA: Diagnosis not present

## 2017-01-11 DIAGNOSIS — M545 Low back pain: Secondary | ICD-10-CM | POA: Diagnosis not present

## 2017-01-11 DIAGNOSIS — Z6828 Body mass index (BMI) 28.0-28.9, adult: Secondary | ICD-10-CM | POA: Diagnosis not present

## 2017-01-11 DIAGNOSIS — I10 Essential (primary) hypertension: Secondary | ICD-10-CM | POA: Diagnosis not present

## 2017-01-11 DIAGNOSIS — I480 Paroxysmal atrial fibrillation: Secondary | ICD-10-CM | POA: Diagnosis not present

## 2017-01-11 DIAGNOSIS — R2681 Unsteadiness on feet: Secondary | ICD-10-CM | POA: Diagnosis not present

## 2017-01-12 LAB — URINE CULTURE: Culture: >=100000 — AB

## 2017-01-13 ENCOUNTER — Telehealth: Payer: Self-pay | Admitting: Emergency Medicine

## 2017-01-13 NOTE — Progress Notes (Signed)
ED Antimicrobial Stewardship Positive Culture Follow Up   [name] is an 83 y.o. female who presented to [facility] on 01/09/2017 with a chief complaint of  Chief Complaint  Patient presents with  . Nausea  . Emesis  . Dizziness    Recent Results (from the past 720 hour(s))  Urine culture     Status: Abnormal   Collection Time: 01/09/17  3:42 PM  Result Value Ref Range Status   Specimen Description URINE, RANDOM  Final   Special Requests NONE  Final   Culture >=100,000 COLONIES/mL ENTEROCOCCUS FAECALIS (A)  Final   Report Status 01/12/2017 FINAL  Final   Organism ID, Bacteria ENTEROCOCCUS FAECALIS (A)  Final      Susceptibility   Enterococcus faecalis - MIC*    AMPICILLIN <=2 SENSITIVE Sensitive     LEVOFLOXACIN 1 SENSITIVE Sensitive     NITROFURANTOIN <=16 SENSITIVE Sensitive     VANCOMYCIN 1 SENSITIVE Sensitive     * >=100,000 COLONIES/mL ENTEROCOCCUS FAECALIS    Plan: Please call the patient to assess urinary symptoms. If symptoms are present, start fosfomycin 3g PO for one dose.    ED Provider: Delos Haring, PA-C   Laqueta Jean 01/13/2017, 9:43 AM PharmD Candidate  Phone# [phone]

## 2017-01-13 NOTE — Telephone Encounter (Signed)
Post ED Visit - Positive Culture Follow-up: Successful Patient Follow-Up  Culture assessed and recommendations reviewed by: []  Elenor Quinones, Pharm.D. []  Heide Guile, Pharm.D., BCPS AQ-ID []  Parks Neptune, Pharm.D., BCPS []  Alycia Rossetti, Pharm.D., BCPS []  [place], Pharm.D., BCPS, AAHIVP []  Legrand Como, Pharm.D., BCPS, AAHIVP []  Salome Arnt, PharmD, BCPS []  Dimitri Ped, PharmD, BCPS []  Vincenza Hews, PharmD, BCPS  Positive urine culture  [x]  Patient discharged without antimicrobial prescription and treatment is now indicated []  Organism is resistant to prescribed ED discharge antimicrobial []  Patient with positive blood cultures  Changes discussed with ED provider: Delos Haring PA New antibiotic prescription symptom check, if + start Fosfomycin 3 grams po x 1 dose  Attempting to contact patient   Hazle Nordmann 01/13/2017, 12:54 PM

## 2017-01-14 DIAGNOSIS — Z7901 Long term (current) use of anticoagulants: Secondary | ICD-10-CM | POA: Diagnosis not present

## 2017-01-14 DIAGNOSIS — Z6827 Body mass index (BMI) 27.0-27.9, adult: Secondary | ICD-10-CM | POA: Diagnosis not present

## 2017-01-14 DIAGNOSIS — E876 Hypokalemia: Secondary | ICD-10-CM | POA: Diagnosis not present

## 2017-01-14 DIAGNOSIS — I480 Paroxysmal atrial fibrillation: Secondary | ICD-10-CM | POA: Diagnosis not present

## 2017-01-17 ENCOUNTER — Encounter: Payer: Self-pay | Admitting: Neurology

## 2017-01-17 ENCOUNTER — Ambulatory Visit (INDEPENDENT_AMBULATORY_CARE_PROVIDER_SITE_OTHER): Payer: Medicare Other | Admitting: Neurology

## 2017-01-17 VITALS — BP 146/70 | HR 54 | Ht 64.0 in | Wt 160.0 lb

## 2017-01-17 DIAGNOSIS — I10 Essential (primary) hypertension: Secondary | ICD-10-CM

## 2017-01-17 DIAGNOSIS — I679 Cerebrovascular disease, unspecified: Secondary | ICD-10-CM | POA: Diagnosis not present

## 2017-01-17 DIAGNOSIS — M542 Cervicalgia: Secondary | ICD-10-CM

## 2017-01-17 DIAGNOSIS — E785 Hyperlipidemia, unspecified: Secondary | ICD-10-CM

## 2017-01-17 DIAGNOSIS — R2681 Unsteadiness on feet: Secondary | ICD-10-CM | POA: Diagnosis not present

## 2017-01-17 DIAGNOSIS — R42 Dizziness and giddiness: Secondary | ICD-10-CM | POA: Diagnosis not present

## 2017-01-17 DIAGNOSIS — R292 Abnormal reflex: Secondary | ICD-10-CM

## 2017-01-17 DIAGNOSIS — I480 Paroxysmal atrial fibrillation: Secondary | ICD-10-CM | POA: Diagnosis not present

## 2017-01-17 NOTE — Patient Instructions (Signed)
1.  We will check MRI of cervical spine without contrast to look for any arthritis or disc bulge causing pinching of spinal cord, which may affect balance. 2. We will also check CT of arteries in head (CTA) to look for any blocked arteries 3.  Continue physical therapy 4.  Follow up after testing

## 2017-01-17 NOTE — Progress Notes (Signed)
NEUROLOGY FOLLOW UP OFFICE NOTE  [name] [id number]  HISTORY OF PRESENT ILLNESS: [name] is an 83 year old right-handed female with paroxysmal atrial fibrillation, hypertension, hyperlipidemia, residual right sided facial droop from mastoiditis as a child and GERD who follows up for a new problem, unsteady gait with frequent falls  On 01/07/17, she developed dizziness.  It is difficult for her to elaborate whether it is spinning sensation or sense of movement.  When sitting or laying flat, she felt fine.  When standing or walking, she felt the dizziness.  There was associated nausea and dry heaving.  She denied visual disturbance, tinnitus or unilateral numbness or weakness.  She was evaluated in the ED 2 days later.  CT of head was personally reviewed and demonstrated advanced chronic small vessel ischemic changes with multiple remote cortical and subcortical lacunar infarcts.  Follow-up diffusion-weighted imaging only of MRI of brain without contrast was personally reviewed and  revealed no acute infarct.  She was unable to complete the MRI due to neck pain.  UA showed negative nitrite but moderate leukocytes with rare bacteria.  Urine culture was positive for enterococcus faecalis.  She has been experiencing multiple UTIs.  Other labs included BMP with Na 136, K 3.3, Cl 98, CO2 28, glucose 134, BUN 21, Cr 1.27, GFR 38; CBC with WBC 8, HGB 9.6, HCT 30.2, PLT 307.  Since then, her UTI has been treated and she no longer has symptoms of UTI.  The dizziness has resolved for the most part.  However, she still feels unsteady on her feet, worse than before last week.  She has been unable to use her cane and has now need to use a walker.  PAST MEDICAL HISTORY: Past Medical History:  Diagnosis Date  . Arthritis    "fingers, hips, feet, ankles" (11/10/2012)  . Asthma   . Atrial fibrillation (HCC)    CHRONIC COUMADIN  . Breast cancer ([place]) 1990's   "cancer on one side; precancerous tissue on  the other" (11/10/2012)  . Chronic bronchitis ([place])    "multiple times; not in a long time" (11/10/2012  . Depression   . GERD (gastroesophageal reflux disease)   . Hearing impaired   . Hypertension   . Irritable bowel   . Lymphedema    HX OF - IN LEFT ARM--NO NEEDLES OR B/P'S LEFT ARM  . Macular degeneration    "BEGINNINGS OF" MACULAR DEGENERATION  . Osteoporosis   . Pneumonia    "multiple times; not in a long time" (11/10/2012)  . Recurrent UTI   . Sleep apnea    "dx'd w/it; don't wear mask or anything" (11/10/2012)  . Stroke ([place]) ~ 2005   HX OF TIA-NO RESIDUAL PROBLEM--PARALYSIS RT SIDE FACE /PT'S MOUTH DROOPS-AND LOSS OF HEARING RT EAR --SINCE EAR SURGERY AS A CHILD   . UTI (lower urinary tract infection)    FREQUENT    MEDICATIONS: Current Outpatient Prescriptions on File Prior to Visit  Medication Sig Dispense Refill  . acetaminophen (TYLENOL) 500 MG tablet Take 500 mg by mouth at bedtime.    Marland Kitchen atorvastatin (LIPITOR) 20 MG tablet Take 20 mg by mouth daily.    . chlorthalidone (HYGROTON) 50 MG tablet Take 50 mg by mouth every morning.    Marland Kitchen CRANBERRY PO Take 1 capsule by mouth 2 (two) times daily.     . diazepam (VALIUM) 2 MG tablet Take 1 tablet (2 mg total) by mouth every 8 (eight) hours as needed (dizziness). 10 tablet  0  . estradiol (ESTRACE) 0.1 MG/GM vaginal cream Place 1 Applicatorful vaginally as directed. Twice a week    . loperamide (IMODIUM) 2 MG capsule Take 2 mg by mouth as needed for diarrhea or loose stools.    Marland Kitchen losartan (COZAAR) 100 MG tablet Take 100 mg by mouth daily.    . metoprolol succinate (TOPROL-XL) 25 MG 24 hr tablet Take 1 tablet (25 mg total) by mouth daily. (Patient taking differently: Take 25 mg by mouth 2 (two) times daily. ) 30 tablet 12  . mirabegron ER (MYRBETRIQ) 25 MG TB24 tablet Take 25 mg by mouth every evening.    . Multiple Vitamin (MULTIVITAMIN) capsule Take 1 capsule by mouth daily.    . Multiple Vitamins-Minerals (PRESERVISION/LUTEIN)  CAPS Take 1 capsule by mouth daily.     . ondansetron (ZOFRAN ODT) 4 MG disintegrating tablet Take 1 tablet (4 mg total) by mouth every 8 (eight) hours as needed for nausea or vomiting. 10 tablet 0  . pantoprazole (PROTONIX) 40 MG tablet Take 40 mg by mouth daily.    Vladimir Faster Glycol-Propyl Glycol (SYSTANE) 0.4-0.3 % GEL ophthalmic gel Place 1 application into both eyes at bedtime.    Vladimir Faster Glycol-Propyl Glycol (SYSTANE) 0.4-0.3 % SOLN Place 1 drop into both eyes at bedtime.     . Probiotic Product (VSL#3 PO) Take 1 capsule by mouth 2 (two) times daily. Take 2 tablets by mouth twice daily     . saccharomyces boulardii (FLORASTOR) 250 MG capsule Take 250 mg by mouth 2 (two) times daily.    Marland Kitchen warfarin (COUMADIN) 5 MG tablet Take 5-7.5 mg by mouth See admin instructions. 5mg  by mouth daily, except 7.5mg  on Saturdays     No current facility-administered medications on file prior to visit.     ALLERGIES: Allergies  Allergen Reactions  . Sulfamethoxazole Diarrhea  . Ampicillin Other (See Comments)    C-DIF AFTER TAKING AMPICILLIN     . Clindamycin/Lincomycin Other (See Comments)    PT STATES HER DOCTOR TOLD HER NOT TO TAKE CLINDAMYCIN BECAUSE SHE GOT C-DIFF AFTER TAKING AMPICILLIN  . Latex Other (See Comments)    Blisters   . Pradaxa [Dabigatran Etexilate Mesylate] Other (See Comments)    INTERNAL BLEEDING  . Ciprofloxacin Rash    RASH PT STATES ANY OTHER DRUGS IN [facility] FAMILY SHE IS ALLERGIC TO    FAMILY HISTORY: No family history on file.  SOCIAL HISTORY: Social History   Social History  . Marital status: Married    Spouse name: N/A  . Number of children: N/A  . Years of education: N/A   Occupational History  . Not on file.   Social History Main Topics  . Smoking status: Never Smoker  . Smokeless tobacco: Never Used  . Alcohol use No  . Drug use: No  . Sexual activity: Not on file   Other Topics Concern  . Not on file   Social History Narrative  . No  narrative on file    REVIEW OF SYSTEMS: Constitutional: No fevers, chills, or sweats, no generalized fatigue, change in appetite Eyes: No visual changes, double vision, eye pain Ear, nose and throat: No hearing loss, ear pain, nasal congestion, sore throat Cardiovascular: No chest pain, palpitations Respiratory:  No shortness of breath at rest or with exertion, wheezes GastrointestinaI: No nausea, vomiting, diarrhea, abdominal pain, fecal incontinence Genitourinary:  No dysuria, urinary retention or frequency Musculoskeletal:  No neck pain, back pain Integumentary: No rash, pruritus, skin lesions Neurological:  as above Psychiatric: No depression, insomnia, anxiety Endocrine: No palpitations, fatigue, diaphoresis, mood swings, change in appetite, change in weight, increased thirst Hematologic/Lymphatic:  No purpura, petechiae. Allergic/Immunologic: no itchy/runny eyes, nasal congestion, recent allergic reactions, rashes  PHYSICAL EXAM: Vitals:   01/17/17 1034  BP: (!) 146/70  Pulse: (!) 54  SpO2: 97%   General: No acute distress.  Patient appears well-groomed.   Head:  Normocephalic/atraumatic Eyes:  Fundi examined but not visualized Neck: supple, no paraspinal tenderness, full range of motion Heart:  Regular rate and rhythm Lungs:  Clear to auscultation bilaterally Back: No paraspinal tenderness Neurological Exam: alert and oriented to person, place, and time. Attention span and concentration intact, recent and remote memory intact, fund of knowledge intact.  Speech fluent and not dysarthric, language intact.  Right upper and lower facial weakness.  Tongue slightly deviated to the right.  Otherwise, CN II-XII intact. Bulk and tone normal, muscle strength 5/5 throughout.  Sensation to light touch, temperature and vibration intact.  Deep tendon reflexes 3+ throughout, Hoffman and Babinski reflex absent.  Finger to nose and heel to shin testing intact.  Wide-based gait with short  strides; Romberg with sway  IMPRESSION: 1.  Dizziness.  MRI negative for for acute stroke, but she has significant cerebrovascular disease with remote lacunar infarcts in the cerebellum.  Possibly recrudescence of old strokes exacerbated by UTI.  Will check CTA to evaluate vertebrobasilar system.  Also consider viral vestibular neuritis.   2.  Gait instability.  May be residual from dizzy spell complicated by deconditioning over the past week.  However, she does have hyperreflexia.  I would want to rule out a cervical stenosis causing myelopathy. 3.  Paroxysmal atrial fibrillation, HTN, Hyperlipidemia  PLAN: 1.  Will check MRI of cervical spine to evaluate for cervical stenosis 2.  Will check CTA of head to evaluate for vertebrobasilar insufficiency 3.  Recommend starting PT (has an order) 4.  Continue anticoagulation, statin therapy and optimize blood pressure control (follow up with PCP) 5.  Follow up after testing.  Metta Clines, DO  CC:  Leanna Battles, MD

## 2017-01-18 DIAGNOSIS — N3000 Acute cystitis without hematuria: Secondary | ICD-10-CM | POA: Diagnosis not present

## 2017-01-18 DIAGNOSIS — N390 Urinary tract infection, site not specified: Secondary | ICD-10-CM | POA: Diagnosis not present

## 2017-01-18 DIAGNOSIS — K580 Irritable bowel syndrome with diarrhea: Secondary | ICD-10-CM | POA: Diagnosis not present

## 2017-01-20 DIAGNOSIS — E663 Overweight: Secondary | ICD-10-CM | POA: Diagnosis not present

## 2017-01-20 DIAGNOSIS — E785 Hyperlipidemia, unspecified: Secondary | ICD-10-CM | POA: Diagnosis not present

## 2017-01-20 DIAGNOSIS — Z7901 Long term (current) use of anticoagulants: Secondary | ICD-10-CM | POA: Diagnosis not present

## 2017-01-20 DIAGNOSIS — I480 Paroxysmal atrial fibrillation: Secondary | ICD-10-CM | POA: Diagnosis not present

## 2017-01-20 DIAGNOSIS — K219 Gastro-esophageal reflux disease without esophagitis: Secondary | ICD-10-CM | POA: Diagnosis not present

## 2017-01-20 DIAGNOSIS — I119 Hypertensive heart disease without heart failure: Secondary | ICD-10-CM | POA: Diagnosis not present

## 2017-01-20 DIAGNOSIS — Z8673 Personal history of transient ischemic attack (TIA), and cerebral infarction without residual deficits: Secondary | ICD-10-CM | POA: Diagnosis not present

## 2017-01-27 ENCOUNTER — Telehealth: Payer: Self-pay | Admitting: Neurology

## 2017-01-27 NOTE — Telephone Encounter (Signed)
Called and talked with Pt's husband. He wanted to make sure if he drove his wife to the MRI appointment, it was OK for her to take tylenol -pm. I advsd him as long as he was driving her, that should be fine.

## 2017-01-27 NOTE — Telephone Encounter (Signed)
Patient called needing to see if she can take 2 Tylenol PM tomorrow before she has her MRI, to help with the pain. Please Call. Thanks

## 2017-01-27 NOTE — Telephone Encounter (Signed)
Patient's husband called and lmom regarding her MRI tomorrow? Please Call. Thanks

## 2017-01-28 ENCOUNTER — Ambulatory Visit
Admission: RE | Admit: 2017-01-28 | Discharge: 2017-01-28 | Disposition: A | Discharge status: Home / Self Care | Payer: Medicare Other | Source: Ambulatory Visit | Attending: Neurology | Admitting: Neurology

## 2017-01-28 DIAGNOSIS — R2681 Unsteadiness on feet: Secondary | ICD-10-CM

## 2017-01-28 DIAGNOSIS — M542 Cervicalgia: Secondary | ICD-10-CM

## 2017-01-28 DIAGNOSIS — I679 Cerebrovascular disease, unspecified: Secondary | ICD-10-CM

## 2017-01-28 DIAGNOSIS — R42 Dizziness and giddiness: Secondary | ICD-10-CM | POA: Diagnosis not present

## 2017-01-28 DIAGNOSIS — M50322 Other cervical disc degeneration at C5-C6 level: Secondary | ICD-10-CM | POA: Diagnosis not present

## 2017-01-28 MED ORDER — IOPAMIDOL (ISOVUE-370) INJECTION 76%
75.0000 mL | Freq: Once | INTRAVENOUS | Status: AC | PRN
  Administered 2017-01-28: 75 mL via INTRAVENOUS

## 2017-02-01 DIAGNOSIS — R2681 Unsteadiness on feet: Secondary | ICD-10-CM | POA: Diagnosis not present

## 2017-02-03 ENCOUNTER — Telehealth: Payer: Self-pay

## 2017-02-03 DIAGNOSIS — R2681 Unsteadiness on feet: Secondary | ICD-10-CM | POA: Diagnosis not present

## 2017-02-03 NOTE — Telephone Encounter (Signed)
Called and spoke with Pt, advsd her of MRI and CTA results. Pt has started P/T and is very pleased with results so far.

## 2017-02-03 NOTE — Telephone Encounter (Signed)
-----   Message from Pieter Partridge, DO sent at 01/31/2017  7:31 AM EST ----- MRI of neck reveals no pinching of spinal cord.  CTA of head shows no blockage of blood vessels.  Everything looks okay

## 2017-02-03 NOTE — Telephone Encounter (Signed)
Called Pt to advise of MRI and CTA results, answering machine came on, then fax machine. Unable to leave message

## 2017-02-07 DIAGNOSIS — M50322 Other cervical disc degeneration at C5-C6 level: Secondary | ICD-10-CM | POA: Diagnosis not present

## 2017-02-07 DIAGNOSIS — H906 Mixed conductive and sensorineural hearing loss, bilateral: Secondary | ICD-10-CM | POA: Diagnosis not present

## 2017-02-07 DIAGNOSIS — M9901 Segmental and somatic dysfunction of cervical region: Secondary | ICD-10-CM | POA: Diagnosis not present

## 2017-02-10 DIAGNOSIS — R2681 Unsteadiness on feet: Secondary | ICD-10-CM | POA: Diagnosis not present

## 2017-02-11 DIAGNOSIS — Z7901 Long term (current) use of anticoagulants: Secondary | ICD-10-CM | POA: Diagnosis not present

## 2017-02-11 DIAGNOSIS — M9901 Segmental and somatic dysfunction of cervical region: Secondary | ICD-10-CM | POA: Diagnosis not present

## 2017-02-11 DIAGNOSIS — I480 Paroxysmal atrial fibrillation: Secondary | ICD-10-CM | POA: Diagnosis not present

## 2017-02-11 DIAGNOSIS — M50322 Other cervical disc degeneration at C5-C6 level: Secondary | ICD-10-CM | POA: Diagnosis not present

## 2017-02-14 DIAGNOSIS — R2681 Unsteadiness on feet: Secondary | ICD-10-CM | POA: Diagnosis not present

## 2017-02-15 DIAGNOSIS — I10 Essential (primary) hypertension: Secondary | ICD-10-CM | POA: Diagnosis not present

## 2017-02-15 DIAGNOSIS — I480 Paroxysmal atrial fibrillation: Secondary | ICD-10-CM | POA: Diagnosis not present

## 2017-02-15 DIAGNOSIS — G4733 Obstructive sleep apnea (adult) (pediatric): Secondary | ICD-10-CM | POA: Diagnosis not present

## 2017-02-15 DIAGNOSIS — R2681 Unsteadiness on feet: Secondary | ICD-10-CM | POA: Diagnosis not present

## 2017-02-15 DIAGNOSIS — Z6828 Body mass index (BMI) 28.0-28.9, adult: Secondary | ICD-10-CM | POA: Diagnosis not present

## 2017-02-16 DIAGNOSIS — M9901 Segmental and somatic dysfunction of cervical region: Secondary | ICD-10-CM | POA: Diagnosis not present

## 2017-02-16 DIAGNOSIS — M50322 Other cervical disc degeneration at C5-C6 level: Secondary | ICD-10-CM | POA: Diagnosis not present

## 2017-02-21 DIAGNOSIS — R2681 Unsteadiness on feet: Secondary | ICD-10-CM | POA: Diagnosis not present

## 2017-02-21 DIAGNOSIS — N3000 Acute cystitis without hematuria: Secondary | ICD-10-CM | POA: Diagnosis not present

## 2017-02-24 DIAGNOSIS — R2681 Unsteadiness on feet: Secondary | ICD-10-CM | POA: Diagnosis not present

## 2017-03-02 DIAGNOSIS — I480 Paroxysmal atrial fibrillation: Secondary | ICD-10-CM | POA: Diagnosis not present

## 2017-03-02 DIAGNOSIS — R2681 Unsteadiness on feet: Secondary | ICD-10-CM | POA: Diagnosis not present

## 2017-03-02 DIAGNOSIS — N3000 Acute cystitis without hematuria: Secondary | ICD-10-CM | POA: Diagnosis not present

## 2017-03-02 DIAGNOSIS — Z7901 Long term (current) use of anticoagulants: Secondary | ICD-10-CM | POA: Diagnosis not present

## 2017-03-04 DIAGNOSIS — R2681 Unsteadiness on feet: Secondary | ICD-10-CM | POA: Diagnosis not present

## 2017-03-08 DIAGNOSIS — R2681 Unsteadiness on feet: Secondary | ICD-10-CM | POA: Diagnosis not present

## 2017-03-11 DIAGNOSIS — R2681 Unsteadiness on feet: Secondary | ICD-10-CM | POA: Diagnosis not present

## 2017-03-14 DIAGNOSIS — R2681 Unsteadiness on feet: Secondary | ICD-10-CM | POA: Diagnosis not present

## 2017-03-16 DIAGNOSIS — H02052 Trichiasis without entropian right lower eyelid: Secondary | ICD-10-CM | POA: Diagnosis not present

## 2017-03-16 DIAGNOSIS — H1859 Other hereditary corneal dystrophies: Secondary | ICD-10-CM | POA: Diagnosis not present

## 2017-03-16 DIAGNOSIS — Z961 Presence of intraocular lens: Secondary | ICD-10-CM | POA: Diagnosis not present

## 2017-03-16 DIAGNOSIS — H02403 Unspecified ptosis of bilateral eyelids: Secondary | ICD-10-CM | POA: Diagnosis not present

## 2017-03-16 DIAGNOSIS — H02002 Unspecified entropion of right lower eyelid: Secondary | ICD-10-CM | POA: Diagnosis not present

## 2017-03-16 DIAGNOSIS — H04551 Acquired stenosis of right nasolacrimal duct: Secondary | ICD-10-CM | POA: Diagnosis not present

## 2017-03-17 DIAGNOSIS — R2681 Unsteadiness on feet: Secondary | ICD-10-CM | POA: Diagnosis not present

## 2017-03-24 DIAGNOSIS — R2681 Unsteadiness on feet: Secondary | ICD-10-CM | POA: Diagnosis not present

## 2017-03-30 DIAGNOSIS — Z7901 Long term (current) use of anticoagulants: Secondary | ICD-10-CM | POA: Diagnosis not present

## 2017-03-30 DIAGNOSIS — R2681 Unsteadiness on feet: Secondary | ICD-10-CM | POA: Diagnosis not present

## 2017-03-30 DIAGNOSIS — I480 Paroxysmal atrial fibrillation: Secondary | ICD-10-CM | POA: Diagnosis not present

## 2017-04-04 DIAGNOSIS — R2681 Unsteadiness on feet: Secondary | ICD-10-CM | POA: Diagnosis not present

## 2017-04-07 DIAGNOSIS — R2681 Unsteadiness on feet: Secondary | ICD-10-CM | POA: Diagnosis not present

## 2017-04-11 DIAGNOSIS — R2681 Unsteadiness on feet: Secondary | ICD-10-CM | POA: Diagnosis not present

## 2017-04-14 DIAGNOSIS — R2681 Unsteadiness on feet: Secondary | ICD-10-CM | POA: Diagnosis not present

## 2017-04-18 DIAGNOSIS — R2681 Unsteadiness on feet: Secondary | ICD-10-CM | POA: Diagnosis not present

## 2017-04-20 DIAGNOSIS — I480 Paroxysmal atrial fibrillation: Secondary | ICD-10-CM | POA: Diagnosis not present

## 2017-04-20 DIAGNOSIS — Z7901 Long term (current) use of anticoagulants: Secondary | ICD-10-CM | POA: Diagnosis not present

## 2017-04-22 DIAGNOSIS — R2681 Unsteadiness on feet: Secondary | ICD-10-CM | POA: Diagnosis not present

## 2017-05-17 DIAGNOSIS — Z7901 Long term (current) use of anticoagulants: Secondary | ICD-10-CM | POA: Diagnosis not present

## 2017-05-17 DIAGNOSIS — I480 Paroxysmal atrial fibrillation: Secondary | ICD-10-CM | POA: Diagnosis not present

## 2017-05-20 DIAGNOSIS — R82998 Other abnormal findings in urine: Secondary | ICD-10-CM | POA: Diagnosis not present

## 2017-05-20 DIAGNOSIS — E7849 Other hyperlipidemia: Secondary | ICD-10-CM | POA: Diagnosis not present

## 2017-05-20 DIAGNOSIS — M859 Disorder of bone density and structure, unspecified: Secondary | ICD-10-CM | POA: Diagnosis not present

## 2017-05-20 DIAGNOSIS — I10 Essential (primary) hypertension: Secondary | ICD-10-CM | POA: Diagnosis not present

## 2017-05-27 DIAGNOSIS — N183 Chronic kidney disease, stage 3 (moderate): Secondary | ICD-10-CM | POA: Diagnosis not present

## 2017-05-27 DIAGNOSIS — E7849 Other hyperlipidemia: Secondary | ICD-10-CM | POA: Diagnosis not present

## 2017-05-27 DIAGNOSIS — Z6829 Body mass index (BMI) 29.0-29.9, adult: Secondary | ICD-10-CM | POA: Diagnosis not present

## 2017-05-27 DIAGNOSIS — M810 Age-related osteoporosis without current pathological fracture: Secondary | ICD-10-CM | POA: Diagnosis not present

## 2017-05-27 DIAGNOSIS — I480 Paroxysmal atrial fibrillation: Secondary | ICD-10-CM | POA: Diagnosis not present

## 2017-05-27 DIAGNOSIS — I10 Essential (primary) hypertension: Secondary | ICD-10-CM | POA: Diagnosis not present

## 2017-05-27 DIAGNOSIS — Z1389 Encounter for screening for other disorder: Secondary | ICD-10-CM | POA: Diagnosis not present

## 2017-05-27 DIAGNOSIS — Z7901 Long term (current) use of anticoagulants: Secondary | ICD-10-CM | POA: Diagnosis not present

## 2017-05-27 DIAGNOSIS — R2681 Unsteadiness on feet: Secondary | ICD-10-CM | POA: Diagnosis not present

## 2017-05-27 DIAGNOSIS — G4733 Obstructive sleep apnea (adult) (pediatric): Secondary | ICD-10-CM | POA: Diagnosis not present

## 2017-05-27 DIAGNOSIS — Z0000 Encounter for general adult medical examination without abnormal findings: Secondary | ICD-10-CM | POA: Diagnosis not present

## 2017-05-30 DIAGNOSIS — Z961 Presence of intraocular lens: Secondary | ICD-10-CM | POA: Diagnosis not present

## 2017-05-30 DIAGNOSIS — H35363 Drusen (degenerative) of macula, bilateral: Secondary | ICD-10-CM | POA: Diagnosis not present

## 2017-05-30 DIAGNOSIS — H02403 Unspecified ptosis of bilateral eyelids: Secondary | ICD-10-CM | POA: Diagnosis not present

## 2017-05-30 DIAGNOSIS — H43813 Vitreous degeneration, bilateral: Secondary | ICD-10-CM | POA: Diagnosis not present

## 2017-05-30 DIAGNOSIS — H1859 Other hereditary corneal dystrophies: Secondary | ICD-10-CM | POA: Diagnosis not present

## 2017-05-30 DIAGNOSIS — H02002 Unspecified entropion of right lower eyelid: Secondary | ICD-10-CM | POA: Diagnosis not present

## 2017-05-30 DIAGNOSIS — H04551 Acquired stenosis of right nasolacrimal duct: Secondary | ICD-10-CM | POA: Diagnosis not present

## 2017-05-30 DIAGNOSIS — Z9889 Other specified postprocedural states: Secondary | ICD-10-CM | POA: Diagnosis not present

## 2017-05-30 DIAGNOSIS — H02052 Trichiasis without entropian right lower eyelid: Secondary | ICD-10-CM | POA: Diagnosis not present

## 2017-06-06 DIAGNOSIS — R399 Unspecified symptoms and signs involving the genitourinary system: Secondary | ICD-10-CM | POA: Diagnosis not present

## 2017-06-16 DIAGNOSIS — I480 Paroxysmal atrial fibrillation: Secondary | ICD-10-CM | POA: Diagnosis not present

## 2017-06-16 DIAGNOSIS — Z7901 Long term (current) use of anticoagulants: Secondary | ICD-10-CM | POA: Diagnosis not present

## 2017-06-29 DIAGNOSIS — K219 Gastro-esophageal reflux disease without esophagitis: Secondary | ICD-10-CM | POA: Diagnosis not present

## 2017-06-29 DIAGNOSIS — K589 Irritable bowel syndrome without diarrhea: Secondary | ICD-10-CM | POA: Diagnosis not present

## 2017-06-29 DIAGNOSIS — R1030 Lower abdominal pain, unspecified: Secondary | ICD-10-CM | POA: Diagnosis not present

## 2017-06-29 DIAGNOSIS — R197 Diarrhea, unspecified: Secondary | ICD-10-CM | POA: Diagnosis not present

## 2017-06-29 DIAGNOSIS — R198 Other specified symptoms and signs involving the digestive system and abdomen: Secondary | ICD-10-CM | POA: Diagnosis not present

## 2017-06-30 DIAGNOSIS — Z7901 Long term (current) use of anticoagulants: Secondary | ICD-10-CM | POA: Diagnosis not present

## 2017-06-30 DIAGNOSIS — R197 Diarrhea, unspecified: Secondary | ICD-10-CM | POA: Diagnosis not present

## 2017-06-30 DIAGNOSIS — I480 Paroxysmal atrial fibrillation: Secondary | ICD-10-CM | POA: Diagnosis not present

## 2017-06-30 DIAGNOSIS — D649 Anemia, unspecified: Secondary | ICD-10-CM | POA: Diagnosis not present

## 2017-07-08 DIAGNOSIS — R399 Unspecified symptoms and signs involving the genitourinary system: Secondary | ICD-10-CM | POA: Diagnosis not present

## 2017-07-20 DIAGNOSIS — H2513 Age-related nuclear cataract, bilateral: Secondary | ICD-10-CM | POA: Diagnosis not present

## 2017-08-05 DIAGNOSIS — N390 Urinary tract infection, site not specified: Secondary | ICD-10-CM | POA: Diagnosis not present

## 2017-08-05 DIAGNOSIS — B964 Proteus (mirabilis) (morganii) as the cause of diseases classified elsewhere: Secondary | ICD-10-CM | POA: Diagnosis not present

## 2017-08-05 DIAGNOSIS — R399 Unspecified symptoms and signs involving the genitourinary system: Secondary | ICD-10-CM | POA: Diagnosis not present

## 2017-08-06 ENCOUNTER — Emergency Department (HOSPITAL_COMMUNITY): Payer: Medicare Other

## 2017-08-06 ENCOUNTER — Encounter (HOSPITAL_COMMUNITY): Payer: Self-pay | Admitting: Emergency Medicine

## 2017-08-06 ENCOUNTER — Inpatient Hospital Stay (HOSPITAL_COMMUNITY)
Admission: EM | Admit: 2017-08-06 | Discharge: 2017-08-11 | DRG: 481 | Disposition: A | Discharge status: Skilled Nursing Facility | Payer: Medicare Other | Attending: Internal Medicine | Admitting: Internal Medicine

## 2017-08-06 DIAGNOSIS — Y92009 Unspecified place in unspecified non-institutional (private) residence as the place of occurrence of the external cause: Secondary | ICD-10-CM | POA: Diagnosis not present

## 2017-08-06 DIAGNOSIS — M25512 Pain in left shoulder: Secondary | ICD-10-CM | POA: Diagnosis not present

## 2017-08-06 DIAGNOSIS — M25552 Pain in left hip: Secondary | ICD-10-CM | POA: Diagnosis not present

## 2017-08-06 DIAGNOSIS — R197 Diarrhea, unspecified: Secondary | ICD-10-CM | POA: Diagnosis not present

## 2017-08-06 DIAGNOSIS — Z7901 Long term (current) use of anticoagulants: Secondary | ICD-10-CM | POA: Diagnosis not present

## 2017-08-06 DIAGNOSIS — M9702XA Periprosthetic fracture around internal prosthetic left hip joint, initial encounter: Secondary | ICD-10-CM | POA: Diagnosis not present

## 2017-08-06 DIAGNOSIS — H3530 Unspecified macular degeneration: Secondary | ICD-10-CM | POA: Diagnosis present

## 2017-08-06 DIAGNOSIS — E860 Dehydration: Secondary | ICD-10-CM | POA: Diagnosis not present

## 2017-08-06 DIAGNOSIS — I129 Hypertensive chronic kidney disease with stage 1 through stage 4 chronic kidney disease, or unspecified chronic kidney disease: Secondary | ICD-10-CM | POA: Diagnosis present

## 2017-08-06 DIAGNOSIS — Z8673 Personal history of transient ischemic attack (TIA), and cerebral infarction without residual deficits: Secondary | ICD-10-CM | POA: Diagnosis not present

## 2017-08-06 DIAGNOSIS — Z96649 Presence of unspecified artificial hip joint: Secondary | ICD-10-CM | POA: Diagnosis not present

## 2017-08-06 DIAGNOSIS — Z96642 Presence of left artificial hip joint: Secondary | ICD-10-CM | POA: Diagnosis not present

## 2017-08-06 DIAGNOSIS — M978XXS Periprosthetic fracture around other internal prosthetic joint, sequela: Secondary | ICD-10-CM | POA: Diagnosis not present

## 2017-08-06 DIAGNOSIS — T1490XA Injury, unspecified, initial encounter: Secondary | ICD-10-CM

## 2017-08-06 DIAGNOSIS — R42 Dizziness and giddiness: Secondary | ICD-10-CM

## 2017-08-06 DIAGNOSIS — I10 Essential (primary) hypertension: Secondary | ICD-10-CM | POA: Diagnosis present

## 2017-08-06 DIAGNOSIS — M810 Age-related osteoporosis without current pathological fracture: Secondary | ICD-10-CM | POA: Diagnosis present

## 2017-08-06 DIAGNOSIS — Z853 Personal history of malignant neoplasm of breast: Secondary | ICD-10-CM | POA: Diagnosis not present

## 2017-08-06 DIAGNOSIS — W19XXXA Unspecified fall, initial encounter: Secondary | ICD-10-CM

## 2017-08-06 DIAGNOSIS — K580 Irritable bowel syndrome with diarrhea: Secondary | ICD-10-CM | POA: Diagnosis present

## 2017-08-06 DIAGNOSIS — R2689 Other abnormalities of gait and mobility: Secondary | ICD-10-CM | POA: Diagnosis not present

## 2017-08-06 DIAGNOSIS — E1122 Type 2 diabetes mellitus with diabetic chronic kidney disease: Secondary | ICD-10-CM | POA: Diagnosis present

## 2017-08-06 DIAGNOSIS — W010XXA Fall on same level from slipping, tripping and stumbling without subsequent striking against object, initial encounter: Secondary | ICD-10-CM | POA: Diagnosis present

## 2017-08-06 DIAGNOSIS — M6281 Muscle weakness (generalized): Secondary | ICD-10-CM | POA: Diagnosis not present

## 2017-08-06 DIAGNOSIS — Z96651 Presence of right artificial knee joint: Secondary | ICD-10-CM | POA: Diagnosis present

## 2017-08-06 DIAGNOSIS — S72402D Unspecified fracture of lower end of left femur, subsequent encounter for closed fracture with routine healing: Secondary | ICD-10-CM | POA: Diagnosis not present

## 2017-08-06 DIAGNOSIS — I69392 Facial weakness following cerebral infarction: Secondary | ICD-10-CM | POA: Diagnosis not present

## 2017-08-06 DIAGNOSIS — J45909 Unspecified asthma, uncomplicated: Secondary | ICD-10-CM | POA: Diagnosis present

## 2017-08-06 DIAGNOSIS — N183 Chronic kidney disease, stage 3 (moderate): Secondary | ICD-10-CM | POA: Diagnosis present

## 2017-08-06 DIAGNOSIS — Z9013 Acquired absence of bilateral breasts and nipples: Secondary | ICD-10-CM | POA: Diagnosis not present

## 2017-08-06 DIAGNOSIS — S0990XA Unspecified injury of head, initial encounter: Secondary | ICD-10-CM | POA: Diagnosis not present

## 2017-08-06 DIAGNOSIS — D649 Anemia, unspecified: Secondary | ICD-10-CM | POA: Diagnosis present

## 2017-08-06 DIAGNOSIS — Z8249 Family history of ischemic heart disease and other diseases of the circulatory system: Secondary | ICD-10-CM

## 2017-08-06 DIAGNOSIS — D62 Acute posthemorrhagic anemia: Secondary | ICD-10-CM | POA: Diagnosis present

## 2017-08-06 DIAGNOSIS — S72402A Unspecified fracture of lower end of left femur, initial encounter for closed fracture: Secondary | ICD-10-CM | POA: Diagnosis not present

## 2017-08-06 DIAGNOSIS — Z823 Family history of stroke: Secondary | ICD-10-CM | POA: Diagnosis not present

## 2017-08-06 DIAGNOSIS — M2550 Pain in unspecified joint: Secondary | ICD-10-CM | POA: Diagnosis not present

## 2017-08-06 DIAGNOSIS — K219 Gastro-esophageal reflux disease without esophagitis: Secondary | ICD-10-CM | POA: Diagnosis present

## 2017-08-06 DIAGNOSIS — S72452A Displaced supracondylar fracture without intracondylar extension of lower end of left femur, initial encounter for closed fracture: Principal | ICD-10-CM | POA: Diagnosis present

## 2017-08-06 DIAGNOSIS — G4730 Sleep apnea, unspecified: Secondary | ICD-10-CM | POA: Diagnosis present

## 2017-08-06 DIAGNOSIS — S199XXA Unspecified injury of neck, initial encounter: Secondary | ICD-10-CM | POA: Diagnosis not present

## 2017-08-06 DIAGNOSIS — I480 Paroxysmal atrial fibrillation: Secondary | ICD-10-CM | POA: Diagnosis not present

## 2017-08-06 DIAGNOSIS — Z419 Encounter for procedure for purposes other than remedying health state, unspecified: Secondary | ICD-10-CM

## 2017-08-06 DIAGNOSIS — R102 Pelvic and perineal pain: Secondary | ICD-10-CM | POA: Diagnosis not present

## 2017-08-06 DIAGNOSIS — M978XXA Periprosthetic fracture around other internal prosthetic joint, initial encounter: Secondary | ICD-10-CM

## 2017-08-06 DIAGNOSIS — Z01810 Encounter for preprocedural cardiovascular examination: Secondary | ICD-10-CM | POA: Diagnosis not present

## 2017-08-06 DIAGNOSIS — S4992XA Unspecified injury of left shoulder and upper arm, initial encounter: Secondary | ICD-10-CM | POA: Diagnosis not present

## 2017-08-06 DIAGNOSIS — S728X2A Other fracture of left femur, initial encounter for closed fracture: Secondary | ICD-10-CM | POA: Diagnosis not present

## 2017-08-06 DIAGNOSIS — M9712XA Periprosthetic fracture around internal prosthetic left knee joint, initial encounter: Secondary | ICD-10-CM | POA: Diagnosis present

## 2017-08-06 DIAGNOSIS — R29810 Facial weakness: Secondary | ICD-10-CM | POA: Diagnosis not present

## 2017-08-06 DIAGNOSIS — R079 Chest pain, unspecified: Secondary | ICD-10-CM | POA: Diagnosis not present

## 2017-08-06 DIAGNOSIS — Z7401 Bed confinement status: Secondary | ICD-10-CM | POA: Diagnosis not present

## 2017-08-06 DIAGNOSIS — Z9181 History of falling: Secondary | ICD-10-CM | POA: Diagnosis not present

## 2017-08-06 DIAGNOSIS — N390 Urinary tract infection, site not specified: Secondary | ICD-10-CM | POA: Diagnosis present

## 2017-08-06 DIAGNOSIS — E785 Hyperlipidemia, unspecified: Secondary | ICD-10-CM | POA: Diagnosis present

## 2017-08-06 DIAGNOSIS — S72352A Displaced comminuted fracture of shaft of left femur, initial encounter for closed fracture: Secondary | ICD-10-CM | POA: Diagnosis not present

## 2017-08-06 DIAGNOSIS — Z4789 Encounter for other orthopedic aftercare: Secondary | ICD-10-CM | POA: Diagnosis not present

## 2017-08-06 DIAGNOSIS — S3993XA Unspecified injury of pelvis, initial encounter: Secondary | ICD-10-CM | POA: Diagnosis not present

## 2017-08-06 DIAGNOSIS — M978XXD Periprosthetic fracture around other internal prosthetic joint, subsequent encounter: Secondary | ICD-10-CM | POA: Diagnosis not present

## 2017-08-06 DIAGNOSIS — M25519 Pain in unspecified shoulder: Secondary | ICD-10-CM

## 2017-08-06 DIAGNOSIS — R41841 Cognitive communication deficit: Secondary | ICD-10-CM | POA: Diagnosis not present

## 2017-08-06 DIAGNOSIS — R404 Transient alteration of awareness: Secondary | ICD-10-CM | POA: Diagnosis not present

## 2017-08-06 DIAGNOSIS — S72402S Unspecified fracture of lower end of left femur, sequela: Secondary | ICD-10-CM | POA: Diagnosis not present

## 2017-08-06 DIAGNOSIS — S299XXA Unspecified injury of thorax, initial encounter: Secondary | ICD-10-CM | POA: Diagnosis not present

## 2017-08-06 DIAGNOSIS — S7292XA Unspecified fracture of left femur, initial encounter for closed fracture: Secondary | ICD-10-CM | POA: Diagnosis present

## 2017-08-06 LAB — COMPREHENSIVE METABOLIC PANEL
ALT: 17 U/L (ref 14–54)
AST: 20 U/L (ref 15–41)
Albumin: 3.7 g/dL (ref 3.5–5.0)
Alkaline Phosphatase: 72 U/L (ref 38–126)
Anion gap: 10 (ref 5–15)
BUN: 39 mg/dL — ABNORMAL HIGH (ref 6–20)
CO2: 25 mmol/L (ref 22–32)
Calcium: 9.1 mg/dL (ref 8.9–10.3)
Chloride: 104 mmol/L (ref 101–111)
Creatinine, Ser: 1.55 mg/dL — ABNORMAL HIGH (ref 0.44–1.00)
GFR calc Af Amer: 35 mL/min — ABNORMAL LOW (ref >60)
GFR calc non Af Amer: 30 mL/min — ABNORMAL LOW (ref >60)
Glucose, Bld: 175 mg/dL — ABNORMAL HIGH (ref 65–99)
Potassium: 3.5 mmol/L (ref 3.5–5.1)
Sodium: 139 mmol/L (ref 135–145)
Total Bilirubin: 0.4 mg/dL (ref 0.3–1.2)
Total Protein: 6.6 g/dL (ref 6.5–8.1)

## 2017-08-06 LAB — CBC
HCT: 29.6 % — ABNORMAL LOW (ref 36.0–46.0)
Hemoglobin: 9.4 g/dL — ABNORMAL LOW (ref 12.0–15.0)
MCH: 27.3 pg (ref 26.0–34.0)
MCHC: 31.8 g/dL (ref 30.0–36.0)
MCV: 86 fL (ref 78.0–100.0)
Platelets: 270 10*3/uL (ref 150–400)
RBC: 3.44 MIL/uL — ABNORMAL LOW (ref 3.87–5.11)
RDW: 14 % (ref 11.5–15.5)
WBC: 10.9 10*3/uL — ABNORMAL HIGH (ref 4.0–10.5)

## 2017-08-06 LAB — PROTIME-INR
INR: 1.66
Prothrombin Time: 19.4 seconds — ABNORMAL HIGH (ref 11.4–15.2)

## 2017-08-06 MED ORDER — FENTANYL CITRATE (PF) 100 MCG/2ML IJ SOLN: 50.0000 ug | Freq: Once | INTRAMUSCULAR | Status: DC

## 2017-08-06 MED ORDER — MORPHINE SULFATE (PF) 4 MG/ML IV SOLN
2.0000 mg | INTRAVENOUS | Status: DC | PRN
  Administered 2017-08-06 – 2017-08-07 (×2): 2 mg via INTRAVENOUS
  Filled 2017-08-06 (×3): qty 1

## 2017-08-06 MED ORDER — MORPHINE SULFATE (PF) 4 MG/ML IV SOLN
2.0000 mg | Freq: Once | INTRAVENOUS | Status: AC
  Administered 2017-08-06: 2 mg via INTRAVENOUS
  Filled 2017-08-06: qty 1

## 2017-08-06 MED ORDER — POTASSIUM CHLORIDE 10 MEQ/100ML IV SOLN
10.0000 meq | INTRAVENOUS | Status: AC
  Administered 2017-08-06 (×2): 10 meq via INTRAVENOUS
  Filled 2017-08-06 (×2): qty 100

## 2017-08-06 MED ORDER — SODIUM CHLORIDE 0.9 % IV BOLUS
500.0000 mL | Freq: Once | INTRAVENOUS | Status: AC
  Administered 2017-08-06: 500 mL via INTRAVENOUS

## 2017-08-06 MED ORDER — SODIUM CHLORIDE 0.9 % IV SOLN
1.0000 g | INTRAVENOUS | Status: DC
  Administered 2017-08-06 – 2017-08-09 (×4): 1 g via INTRAVENOUS
  Filled 2017-08-06 (×5): qty 10

## 2017-08-06 NOTE — ED Notes (Signed)
MD and Ortho tech at bedside, applying knee immobilizer.

## 2017-08-06 NOTE — H&P (Signed)
[name] IZT:[id number] DOB: [birth year] DOA: 08/06/2017     PCP: Leanna Battles, MD   Outpatient Specialists:    Urology [facility] Neurology Tomi Likens Dr. Wynonia Lawman cardiology Patient arrived to ER on 08/06/17 at 1608  Patient coming from:    home Lives   With family    Chief Complaint:  Chief Complaint  Patient presents with  . Hip Pain    HPI: Mahima Hottle is a 84 y.o. 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 She felt dizzy she had her cain with her. No head injury no LOC.  Denies black stool or blood in stool. Reports recent symptoms of UTI. Reports diarrhea recently x3 no vomiting but slight nausea Husband gave her immodium.  She has not slept well. She went to urology on Friday and gave a sample.  UA positive for UTI  last time she was treated with Manurol.  Last urine culture Grew Proteus resistant to tetracycline  At baseline able to walk up a stair case or walk a block with out chest pain.   Regarding pertinent Chronic problems: Atrial fibrillation on Coumadin. No hx of CAD   While in ER: Found to have left periprosthetic distal femur fracture similar to prior total knee replacement Placed in knee immobilizer seen by orthopedics Following Medications were ordered in ER: Medications  sodium chloride 0.9 % bolus 500 mL (0 mLs Intravenous Stopped 08/06/17 1745)  morphine 4 MG/ML injection 2 mg (2 mg Intravenous Given 08/06/17 1650)  morphine 4 MG/ML injection 2 mg (2 mg Intravenous Given 08/06/17 1806)  morphine 4 MG/ML injection 2 mg (2 mg Intravenous Given 08/06/17 1905)    Significant initial  Findings: Abnormal Labs Reviewed  CBC - Abnormal; Notable for the following components:      Result Value   WBC 10.9 (*)    RBC 3.44 (*)    Hemoglobin 9.4 (*)    HCT  29.6 (*)    All other components within normal limits  PROTIME-INR - Abnormal; Notable for the following components:   Prothrombin Time 19.4 (*)    All other components within normal limits  COMPREHENSIVE METABOLIC PANEL - Abnormal; Notable for the following components:   Glucose, Bld 175 (*)    BUN 39 (*)    Creatinine, Ser 1.55 (*)    GFR calc non Af Amer 30 (*)    GFR calc Af Amer 35 (*)    All other components within normal limits     Na 139 K 3.5  Cr    stable,   Lab Results  Component Value Date   CREATININE 1.55 (H) 08/06/2017   CREATININE 1.27 (H) 01/09/2017   CREATININE 1.75 (H) 02/19/2016      WBC  10.9  HG/HCT      Component Value Date/Time   HGB 9.4 (L) 08/06/2017 1630   HCT 29.6 (L) 08/06/2017 1630      Lactic Acid, Venous    Component Value Date/Time   LATICACIDVEN 0.9 11/09/2012 2054   INR 1.66   UA  ordered   CT HEAD   NON acute old CVA  CXR - NON acute  Left femur - Severely comminuted fracture of the distal left femur metadiaphysis extending to involve preexisting left total knee hardware.   ECG:  Personally reviewed by me showing: HR : 70 Rhythm:  NSR,     no evidence of ischemic changes [id number]    ED Triage Vitals  Enc Vitals Group     BP 08/06/17 1622 (!) 134/55     Pulse Rate 08/06/17 1622 79     Resp 08/06/17 1622 20     Temp 08/06/17 1622 98.2 F (36.8 C)     Temp Source 08/06/17 1622 Oral     SpO2 08/06/17 1618 98 %     Weight 08/06/17 1622 161 lb (73 kg)     Height 08/06/17 1622 5' 4.5" (1.638 m)     Head Circumference --      Peak Flow --      Pain Score 08/06/17 1622 4     Pain Loc --      Pain Edu? --      Excl. in [place]? --   TMAX(24)@       Latest  Blood pressure (!) 134/55, pulse 87, temperature 98.2 F (36.8 C), temperature source Oral, resp. rate 19, height 5' 4.5" (1.638 m), weight 73 kg (161 lb), SpO2 97 %.     ER Provider Called: Ortho    Dr.Blackman  They Recommend medical admission  Will see in   in ER  Hospitalist was called for admission for Femur fracture  Review of Systems:    Pertinent positives include: lightheadedness, left leg pain  Constitutional:  No weight loss, night sweats, Fevers, chills, fatigue, weight loss  HEENT:  No headaches, Difficulty swallowing,Tooth/dental problems,Sore throat,  No sneezing, itching, ear ache, nasal congestion, post nasal drip,  Cardio-vascular:  No chest pain, Orthopnea, PND, anasarca, dizziness, palpitations.no Bilateral lower extremity swelling  GI:  No heartburn, indigestion, abdominal pain, nausea, vomiting, diarrhea, change in bowel habits, loss of appetite, melena, blood in stool, hematemesis Resp:  no shortness of breath at rest. No dyspnea on exertion, No excess mucus, no productive cough, No non-productive cough, No coughing up of blood.No change in color of mucus.No wheezing. Skin:  no rash or lesions. No jaundice GU:  no dysuria, change in color of urine, no urgency or frequency. No straining to urinate.  No flank pain.  Musculoskeletal:  No joint pain or no joint swelling. No decreased range of motion. No back pain.  Psych:  No change in mood or affect. No depression or anxiety. No memory loss.  Neuro: no localizing neurological complaints, no tingling, no weakness, no double vision, no gait abnormality, no slurred speech, no confusion  As per HPI otherwise 10 point review of systems negative.   Past Medical History:   Past Medical History:  Diagnosis Date  . Arthritis    "fingers, hips, feet, ankles" (11/10/2012)  . Asthma   . Atrial fibrillation (HCC)    CHRONIC COUMADIN  . Breast cancer ([place]) 1990's   "cancer on one side; precancerous tissue on the other" (11/10/2012)  . Chronic bronchitis ([place])    "multiple times; not in a long time" (11/10/2012  . Depression   . GERD (gastroesophageal reflux disease)   . Hearing impaired   . Hypertension   . Irritable bowel   . Lymphedema    HX OF - IN LEFT ARM--NO  NEEDLES OR B/P'S LEFT ARM  . Macular degeneration    "BEGINNINGS OF" MACULAR DEGENERATION  . Osteoporosis   . Pneumonia    "multiple times; not in a long time" (11/10/2012)  . Recurrent UTI   . Sleep apnea    "  dx'd w/it; don't wear mask or anything" (11/10/2012)  . Stroke ([place]) ~ 2005   HX OF TIA-NO RESIDUAL PROBLEM--PARALYSIS RT SIDE FACE /PT'S MOUTH DROOPS-AND LOSS OF HEARING RT EAR --SINCE EAR SURGERY AS A CHILD   . UTI (lower urinary tract infection)    FREQUENT       Past Surgical History:  Procedure Laterality Date  . BREAST BIOPSY Bilateral 1990's  . Dacryocystorhinostomy  02/18/2016  . INNER EAR SURGERY Right    MULTIPLE EAR SURGERIES,  . JOINT REPLACEMENT    . KNEE ARTHROSCOPY  04/07/2012   Procedure: ARTHROSCOPY KNEE;  Surgeon: Gearlean Alf, MD;  Location: [facility];  Service: Orthopedics;  Laterality: Left;  WITH SYNOVECTOMY  . MASTECTOMY Bilateral 1990's  . MASTOIDECTOMY Right 1942  . TONSILLECTOMY    . TOTAL KNEE ARTHROPLASTY  09/20/2011   LEFT TOTAL KNEE ARTHROPLASTY;  Surgeon: Gearlean Alf, MD;  Location: [facility];  Service: Orthopedics;  Laterality: Left;  . TOTAL KNEE ARTHROPLASTY  2006    Social History:  Ambulatory   cane,       reports that she has never smoked. She has never used smokeless tobacco. She reports that she does not drink alcohol or use drugs.     Family History:   Family History  Problem Relation Age of Onset  . CAD Mother   . Stroke Mother   . CAD Father     Allergies: Allergies  Allergen Reactions  . Sulfamethoxazole Diarrhea  . Ampicillin Other (See Comments)    C-DIF AFTER TAKING AMPICILLIN    Has patient had a PCN reaction causing immediate rash, facial/tongue/throat swelling, SOB or lightheadedness with hypotension: No Has patient had a PCN reaction causing severe rash involving mucus membranes or skin necrosis: No Has patient had a PCN reaction that required hospitalization: No Has patient had a PCN  reaction occurring within the last 10 years: No If all of the above answers are "NO", then may proceed with Cephalosporin use.  . Clindamycin/Lincomycin Other (See Comments)    PT STATES HER DOCTOR TOLD HER NOT TO TAKE CLINDAMYCIN BECAUSE SHE GOT C-DIFF AFTER TAKING AMPICILLIN  . Latex Other (See Comments)    Blisters   . Pradaxa [Dabigatran Etexilate Mesylate] Other (See Comments)    INTERNAL BLEEDING  . Ciprofloxacin Rash    RASH PT STATES ANY OTHER DRUGS IN [facility]IPRO FAMILY SHE IS ALLERGIC TO     Prior to Admission medications   Medication Sig Start Date End Date Taking? Authorizing Provider  atorvastatin (LIPITOR) 20 MG tablet Take 20 mg by mouth daily after supper.    Yes [provider]  chlorthalidone (HYGROTON) 50 MG tablet Take 50 mg by mouth daily.    Yes [provider]  CRANBERRY PO Take 1 capsule by mouth 2 (two) times daily.    Yes [provider]  diphenhydramine-acetaminophen (TYLENOL PM) 25-500 MG TABS tablet Take 1 tablet by mouth at bedtime.   Yes [provider]  estradiol (ESTRACE) 0.1 MG/GM vaginal cream Place 1 Applicatorful vaginally See admin instructions. Apply vaginally twice weekly   Yes [provider]  loperamide (IMODIUM) 2 MG capsule Take 2-4 mg by mouth 5 (five) times daily as needed for diarrhea or loose stools.    Yes [provider]  losartan (COZAAR) 100 MG tablet Take 100 mg by mouth daily.   Yes [provider]  metoprolol succinate (TOPROL-XL) 25 MG 24 hr tablet Take 1 tablet (25 mg total)  by mouth daily. Patient taking differently: Take 25 mg by mouth 2 (two) times daily.  11/10/12  Yes Leanna Battles, MD  Multiple Vitamin (MULTIVITAMIN WITH MINERALS) TABS tablet Take 1 tablet by mouth daily.   Yes [provider]  Multiple Vitamins-Minerals (PRESERVISION/LUTEIN) CAPS Take 1 capsule by mouth daily.    Yes [provider]  pantoprazole (PROTONIX) 40 MG tablet Take 40 mg by  mouth daily.   Yes [provider]  Polyethyl Glycol-Propyl Glycol (SYSTANE) 0.4-0.3 % GEL ophthalmic gel Place 1 application into both eyes See admin instructions. Apply to both eyes every other night at bedtime (alternate with drops)   Yes [provider]  Polyethyl Glycol-Propyl Glycol (SYSTANE) 0.4-0.3 % SOLN Place 1 drop into both eyes See admin instructions. Instill one drop into both eyes every other night at bedtime (alternate with gel)   Yes [provider]  Probiotic Product (VSL#3 PO) Take 1 capsule by mouth daily.    Yes [provider]  ranitidine (ZANTAC) 150 MG capsule Take 150 mg by mouth daily as needed for heartburn.  07/29/17  Yes [provider]  saccharomyces boulardii (FLORASTOR) 250 MG capsule Take 250-500 mg by mouth See admin instructions. Take one tablet (250 mg) by mouth every morning and two tablets (500 mg) at night   Yes [provider]  warfarin (COUMADIN) 5 MG tablet Take 5 mg by mouth daily with supper. 5mg  by mouth daily, except 7.5mg  on Saturdays 09/23/11  Yes Perkins, Alexzandrew L, PA-C  diazepam (VALIUM) 2 MG tablet Take 1 tablet (2 mg total) by mouth every 8 (eight) hours as needed (dizziness). Patient not taking: Reported on 08/06/2017 01/09/17   Sherwood Gambler, MD  mirabegron ER (MYRBETRIQ) 25 MG TB24 tablet Take 25 mg by mouth every evening.    [provider]  ondansetron (ZOFRAN ODT) 4 MG disintegrating tablet Take 1 tablet (4 mg total) by mouth every 8 (eight) hours as needed for nausea or vomiting. Patient not taking: Reported on 08/06/2017 01/09/17   Sherwood Gambler, MD   Physical Exam: Blood pressure (!) 134/55, pulse 87, temperature 98.2 F (36.8 C), temperature source Oral, resp. rate 19, height 5' 4.5" (1.638 m), weight 73 kg (161 lb), SpO2 97 %. 1. General:  in No Acute distress   Chronically ill  -appearing 2. Psychological: Alert and  Oriented 3. Head/ENT:     Dry Mucous Membranes                           Head Non traumatic, neck supple                           Poor Dentition 4. SKIN:  decreased Skin turgor,  Skin clean Dry and intact no rash 5. Heart: Regular rate and rhythm no  Murmur, no Rub or gallop 6. Lungs:  Clear to auscultation bilaterally, no wheezes or crackles   7. Abdomen: Soft,  non-tender, Non distended   obese  bowel sounds present 8. Lower extremities: no clubbing, cyanosis, or edema 9. Neurologically Grossly intact, moving all 4 extremities equally   10. MSK: Normal range of motion except for left leg   LABS:     Recent Labs  Lab 08/06/17 1630  WBC 10.9*  HGB 9.4*  HCT 29.6*  MCV 86.0  PLT 222   Basic Metabolic Panel: Recent Labs  Lab 08/06/17 1630  NA 139  K  3.5  CL 104  CO2 25  GLUCOSE 175*  BUN 39*  CREATININE 1.55*  CALCIUM 9.1      Recent Labs  Lab 08/06/17 1630  AST 20  ALT 17  ALKPHOS 72  BILITOT 0.4  PROT 6.6  ALBUMIN 3.7   No results for input(s): LIPASE, AMYLASE in the last 168 hours. No results for input(s): AMMONIA in the last 168 hours.    HbA1C: No results for input(s): HGBA1C in the last 72 hours. CBG: No results for input(s): GLUCAP in the last 168 hours.    Urine analysis:    Component Value Date/Time   COLORURINE YELLOW 01/09/2017 1542   APPEARANCEUR CLEAR 01/09/2017 1542   LABSPEC 1.010 01/09/2017 1542   PHURINE 7.0 01/09/2017 1542   GLUCOSEU NEGATIVE 01/09/2017 1542   HGBUR NEGATIVE 01/09/2017 [place] 01/09/2017 1542   KETONESUR NEGATIVE 01/09/2017 1542   PROTEINUR NEGATIVE 01/09/2017 1542   UROBILINOGEN 0.2 07/28/2014 1239   NITRITE NEGATIVE 01/09/2017 1542   LEUKOCYTESUR MODERATE (A) 01/09/2017 1542       Cultures:    Component Value Date/Time   SDES URINE, RANDOM 01/09/2017 1542   SPECREQUEST NONE 01/09/2017 1542   CULT >=100,000 COLONIES/mL ENTEROCOCCUS FAECALIS (A) 01/09/2017 1542   REPTSTATUS 01/12/2017 FINAL 01/09/2017 1542     Radiological Exams  on Admission: Dg Chest 1 View  Result Date: 08/06/2017 CLINICAL DATA:  84 year old female status post fall, in severe pain. Acute left femur fracture. EXAM: CHEST  1 VIEW COMPARISON:  Chest CTA and radiographs 11/04/2015 and earlier. FINDINGS: Portable AP supine view at 1712 hours. Stable cardiomegaly and mediastinal contours. Stable tortuous thoracic aorta. Visualized tracheal air column is within normal limits. Allowing for portable technique the lungs are clear. Osteopenia. No acute osseous abnormality identified. IMPRESSION: No acute cardiopulmonary abnormality. Electronically Signed   By: Genevie Ann M.D.   On: 08/06/2017 17:37   Dg Pelvis 1-2 Views  Result Date: 08/06/2017 CLINICAL DATA:  84 year old female status post fall, in severe pain. Acute left femur fracture. EXAM: PELVIS - 1-2 VIEW COMPARISON:  Left femur series today reported separately. CT Abdomen and Pelvis 08/11/2012. FINDINGS: Supine view at 1713 hours. The femoral heads remain normally located. The proximal femurs appear intact. The pelvis appears stable and intact. Sacral ala and SI joints appear stable and intact. Stable chronic lower lumbar degenerative facet hypertrophy. Negative visible bowel gas pattern. IMPRESSION: No acute fracture or dislocation identified about the pelvis. Electronically Signed   By: Genevie Ann M.D.   On: 08/06/2017 17:37   Ct Head Wo Contrast  Result Date: 08/06/2017 CLINICAL DATA:  [place] today onto LEFT side, head trauma, history atrial fibrillation, chronic anticoagulation therapy, high clinical risk, history breast cancer, asthma, hypertension, stroke EXAM: CT HEAD WITHOUT CONTRAST CT CERVICAL SPINE WITHOUT CONTRAST TECHNIQUE: Multidetector CT imaging of the head and cervical spine was performed following the standard protocol without intravenous contrast. Multiplanar CT image reconstructions of the cervical spine were also generated. COMPARISON:  01/28/2017 CT head, CT cervical spine 08/18/2010 FINDINGS: CT  HEAD FINDINGS Brain: Generalized atrophy. Normal ventricular morphology. No midline shift or mass effect. Small vessel chronic ischemic changes of deep cerebral white matter. Old RIGHT posterior parietal and LEFT cerebellar infarcts. Old lacunar infarcts BILATERAL basal ganglia. No intracranial hemorrhage, mass lesion, evidence of acute infarction, or extra-axial fluid collection. Vascular: Atherosclerotic calcification of internal carotid and vertebral arteries bilaterally at skull base Skull: Skull intact Sinuses/Orbits: Small amount of fluid/mucous in the RIGHT  sphenoid sinus. Opacified LEFT ethmoid air cell. Visualized paranasal sinuses and LEFT mastoid air cells otherwise clear. Prior RIGHT mastoidectomy. Nasal septal deviation to the RIGHT. Other: N/A CT CERVICAL SPINE FINDINGS Alignment: Normal, stable Skull base and vertebrae: Diffuse osseous demineralization. Scattered facet degenerative changes. Ankylosis of the LEFT C4-C5 facet joint. Visualized skull base intact. No acute fracture or bone destruction. Soft tissues and spinal canal: Prevertebral soft tissues normal thickness. Atherosclerotic calcifications at the LEFT carotid bifurcation and proximal great vessels. Disc levels:  No additional abnormalities Upper chest: Lung apices clear Other: N/A IMPRESSION: Atrophy with small vessel chronic ischemic changes of deep cerebral white matter. Old RIGHT posterior parietal and LEFT cerebellar infarcts as well as BILATERAL basal ganglia lacunar infarcts. No acute intracranial abnormalities. Degenerative facet disease changes of the cervical spine. No acute cervical spine abnormalities. Electronically Signed   By: Lavonia Dana M.D.   On: 08/06/2017 18:04   Ct Cervical Spine Wo Contrast  Result Date: 08/06/2017 CLINICAL DATA:  [place] today onto LEFT side, head trauma, history atrial fibrillation, chronic anticoagulation therapy, high clinical risk, history breast cancer, asthma, hypertension, stroke EXAM: CT  HEAD WITHOUT CONTRAST CT CERVICAL SPINE WITHOUT CONTRAST TECHNIQUE: Multidetector CT imaging of the head and cervical spine was performed following the standard protocol without intravenous contrast. Multiplanar CT image reconstructions of the cervical spine were also generated. COMPARISON:  01/28/2017 CT head, CT cervical spine 08/18/2010 FINDINGS: CT HEAD FINDINGS Brain: Generalized atrophy. Normal ventricular morphology. No midline shift or mass effect. Small vessel chronic ischemic changes of deep cerebral white matter. Old RIGHT posterior parietal and LEFT cerebellar infarcts. Old lacunar infarcts BILATERAL basal ganglia. No intracranial hemorrhage, mass lesion, evidence of acute infarction, or extra-axial fluid collection. Vascular: Atherosclerotic calcification of internal carotid and vertebral arteries bilaterally at skull base Skull: Skull intact Sinuses/Orbits: Small amount of fluid/mucous in the RIGHT sphenoid sinus. Opacified LEFT ethmoid air cell. Visualized paranasal sinuses and LEFT mastoid air cells otherwise clear. Prior RIGHT mastoidectomy. Nasal septal deviation to the RIGHT. Other: N/A CT CERVICAL SPINE FINDINGS Alignment: Normal, stable Skull base and vertebrae: Diffuse osseous demineralization. Scattered facet degenerative changes. Ankylosis of the LEFT C4-C5 facet joint. Visualized skull base intact. No acute fracture or bone destruction. Soft tissues and spinal canal: Prevertebral soft tissues normal thickness. Atherosclerotic calcifications at the LEFT carotid bifurcation and proximal great vessels. Disc levels:  No additional abnormalities Upper chest: Lung apices clear Other: N/A IMPRESSION: Atrophy with small vessel chronic ischemic changes of deep cerebral white matter. Old RIGHT posterior parietal and LEFT cerebellar infarcts as well as BILATERAL basal ganglia lacunar infarcts. No acute intracranial abnormalities. Degenerative facet disease changes of the cervical spine. No acute  cervical spine abnormalities. Electronically Signed   By: Lavonia Dana M.D.   On: 08/06/2017 18:04   Dg Femur Min 2 Views Left  Result Date: 08/06/2017 CLINICAL DATA:  Status post placement of a knee immobilizer EXAM: LEFT FEMUR 2 VIEWS COMPARISON:  Films from earlier in the same day. FINDINGS: The proximal left femur is within normal limits. The previously seen comminuted distal femoral fracture is again seen. The degree of angulation and displacement has reduced somewhat. No other focal abnormality is noted. IMPRESSION: Slight reduction in distal femoral fracture. Electronically Signed   By: Inez Catalina M.D.   On: 08/06/2017 19:13   Dg Femur Min 2 Views Left  Result Date: 08/06/2017 CLINICAL DATA:  84 year old female status post fall, in severe pain. Externally rotated left lower leg. EXAM: LEFT FEMUR  2 VIEWS COMPARISON:  Left tib-fib series 06/10/2009. FINDINGS: The left femoral head remains normally located in the proximal left femur appears intact. The left hemipelvis appears intact. Osteopenia. Spiral comminuted, displaced, and impacted fracture of the distal left femur metadiaphysis. The fracture extends to involve left total knee femoral hardware. There is a large 9 centimeter butterfly fragment projecting obliquely over the distal femoral shaft. There is greater than 1 full shaft width posterior displacement and superimposed posterior angulation. There is also rotation of the distal fragment. IMPRESSION: 1. Severely comminuted fracture of the distal left femur metadiaphysis extending to involve preexisting left total knee hardware. Greater than 1 full shaft width posterior displacement with superimposed angulation and rotation of the distal fragment. 2. The mid and proximal left femur remain intact. Electronically Signed   By: Genevie Ann M.D.   On: 08/06/2017 17:36    Chart has been reviewed    Assessment/Plan  84 y.o. female with medical history significant of CVA   residual right-sided  facial droop secondary to mastoiditis as a child, a.fib on coumadin, breast cancer, GERD, HTN, HLD Admitted for LEFT FEMUR FRACTURE  Present on Admission: Lightheadedness unclear if fall resulted in that but denies true syncope likely secondary to dehydration and possibly an anemia we will rehydrate cycle cardiac enzymes . Anemia order type and screen follow CBC patient denies any blood in stool but does appear to be pale will order anemia panel and Hemoccult stool transfuse as needed could also be contributing to her sensation of lightheadedness . Hypertension  - stable restart home medications . Paroxysmal atrial fibrillation (HCC) -           - CHA2DS2 vas score 5: No need to resume anticoagulation soon as safe per orthopedics currently allow INR to drift down given elevated score with probably benefit from bridging once deemed safe by orthopedics to initiate anticoagulation given severity of fracture          -  Rate control:  Currently controlled with  Toprolol,   will continue    . Femur fracture, left (HCC) -  - management as per orthopedics,      Patient  on anticoagulation or antiplatelet agents    on hold until  safe to restart per orthopedics Ordered type and screen, Place Foley, order a vitamin D level  Patient at baseline able to walk a flight of stairs or 100 feet     Patient denies any chest pain or shortness of breath currently and/or with exertion,   ECG showing no evidence of acute ischemia  no known history of coronary artery disease,  COPD   Liver failure does have  CKD stage III is currently at baseline  Given advanced age patient is at least moderate  Risk she is unsure regarding the nature of her fall and has endorsed lightheadedness will monitor on telemetry and cycle cardiac enzymes for tonight patient appears to be somewhat dehydrated    Given somewhat abnormal baseline EKG will order echo   cycle CE    . Diarrhea possible recent antibiotic exposure we will check  for C. difficile as well as gastric panel . Dehydration we will gently rehydrate UTI - treated with Rocephin await results of urine culture  Other plan as per orders.  DVT prophylaxis:  SCD  Code Status:  FULL CODE  as per patient   I had personally discussed CODE STATUS with patient and family   Family Communication:   Family  at  Bedside  plan of care was discussed with  Husband,    Disposition Plan:    likely will need placement for rehabilitation                                               Social Work                            Consults called: Orthopedics    Admission status:  inpatient     Level of care     tele             Toy Baker 08/06/2017, 9:33 PM    [facility]  Pager [phone]   after 2 AM please page floor coverage PA If 7AM-7PM, please contact the day team taking care of the patient  [email or web address]  Password [email or web address]

## 2017-08-06 NOTE — Progress Notes (Signed)
Orthopedic Tech Progress Note Patient Details:  [name] 1933/09/21 [id number]  Ortho Devices Type of Ortho Device: Knee Immobilizer Ortho Device/Splint Location: LLE Ortho Device/Splint Interventions: Ordered, Application   Post Interventions Patient Tolerated: Well Instructions Provided: Care of device   Braulio Bosch 08/06/2017, 6:10 PM

## 2017-08-06 NOTE — ED Notes (Signed)
Patient back from x-ray/CT and updated on status. Ortho paged for knee immobilizer.

## 2017-08-06 NOTE — ED Triage Notes (Signed)
Per [facility]: Patient to ED c/o L hip and upper leg pain following fall onto L side. Patient reports she has had dysuria for a few days with intermittent dizziness upon standing/walking. Patient on coumadin, states she did hit her head - denies head or neck pain, denies dizziness at this time. Patient has facial droop that is baseline. Obvious deformity noted to L leg - swelling to L upper leg extending to L knee, L leg shortened and rotated. Pedal pulses equal bilaterally. Patient A&O x 4.

## 2017-08-06 NOTE — Consult Note (Signed)
Reason for Consult: Left distal femur fracture (periprosthetic) Referring Physician:   Pattricia Boss, MD  [name] is an 84 y.o. female.  HPI: The patient is a very pleasant 84 year old female with multiple medical problems who sustained an accidental mechanical fall earlier today.  She was brought via EMS to the emergency room and found to have a left distal femur fracture.  This is a periprosthetic fracture that is just proximal to a total knee arthroplasty.  The total knee arthroplasty on the left side was performed at [facility] around 2006.  In the interim she has had a right total knee arthroplasty by Dr. Wynelle Link with [facility].  The personal call for that group though his deferred to the unassigned orthopedic call since they did not do this knee.  The patient does report severe left thigh and knee pain.  She denies any syncopal episode and denies any other acute problems as it relates to her current fracture left femur.  She does have a complex medical history.  This includes being on blood thinners in the past.  Family is at the bedside.  Again the patient does report significant left knee pain.  She understands this is a complex issue and the most likely surgery will not be performed this weekend in order to maximize her medical status as well as to better ascertain the complexity of the fracture to CT scan so that appropriate surgical planning can be made.    Past Medical History:  Diagnosis Date  . Arthritis    "fingers, hips, feet, ankles" (11/10/2012)  . Asthma   . Atrial fibrillation (HCC)    CHRONIC COUMADIN  . Breast cancer ([place]) 1990's   "cancer on one side; precancerous tissue on the other" (11/10/2012)  . Chronic bronchitis ([place])    "multiple times; not in a long time" (11/10/2012  . Depression   . GERD (gastroesophageal reflux disease)   . Hearing impaired   . Hypertension   . Irritable bowel   . Lymphedema    HX OF - IN LEFT ARM--NO NEEDLES OR B/P'S LEFT ARM  .  Macular degeneration    "BEGINNINGS OF" MACULAR DEGENERATION  . Osteoporosis   . Pneumonia    "multiple times; not in a long time" (11/10/2012)  . Recurrent UTI   . Sleep apnea    "dx'd w/it; don't wear mask or anything" (11/10/2012)  . Stroke ([place]) ~ 2005   HX OF TIA-NO RESIDUAL PROBLEM--PARALYSIS RT SIDE FACE /PT'S MOUTH DROOPS-AND LOSS OF HEARING RT EAR --SINCE EAR SURGERY AS A CHILD   . UTI (lower urinary tract infection)    FREQUENT    Past Surgical History:  Procedure Laterality Date  . BREAST BIOPSY Bilateral 1990's  . Dacryocystorhinostomy  02/18/2016  . INNER EAR SURGERY Right    MULTIPLE EAR SURGERIES,  . JOINT REPLACEMENT    . KNEE ARTHROSCOPY  04/07/2012   Procedure: ARTHROSCOPY KNEE;  Surgeon: Gearlean Alf, MD;  Location: [facility];  Service: Orthopedics;  Laterality: Left;  WITH SYNOVECTOMY  . MASTECTOMY Bilateral 1990's  . MASTOIDECTOMY Right 1942  . TONSILLECTOMY    . TOTAL KNEE ARTHROPLASTY  09/20/2011   LEFT TOTAL KNEE ARTHROPLASTY;  Surgeon: Gearlean Alf, MD;  Location: [facility];  Service: Orthopedics;  Laterality: Left;  . TOTAL KNEE ARTHROPLASTY  2006    No family history on file.  Social History:  reports that she has never smoked. She has never used smokeless tobacco. She reports that she  does not drink alcohol or use drugs.  Allergies:  Allergies  Allergen Reactions  . Sulfamethoxazole Diarrhea  . Ampicillin Other (See Comments)    C-DIF AFTER TAKING AMPICILLIN    Has patient had a PCN reaction causing immediate rash, facial/tongue/throat swelling, SOB or lightheadedness with hypotension: No Has patient had a PCN reaction causing severe rash involving mucus membranes or skin necrosis: No Has patient had a PCN reaction that required hospitalization: No Has patient had a PCN reaction occurring within the last 10 years: No If all of the above answers are "NO", then may proceed with Cephalosporin use.  . Clindamycin/Lincomycin Other  (See Comments)    PT STATES HER DOCTOR TOLD HER NOT TO TAKE CLINDAMYCIN BECAUSE SHE GOT C-DIFF AFTER TAKING AMPICILLIN  . Latex Other (See Comments)    Blisters   . Pradaxa [Dabigatran Etexilate Mesylate] Other (See Comments)    INTERNAL BLEEDING  . Ciprofloxacin Rash    RASH PT STATES ANY OTHER DRUGS IN [facility] FAMILY SHE IS ALLERGIC TO    Medications: I have reviewed the patient's current medications.  Results for orders placed or performed during the hospital encounter of 08/06/17 (from the past 48 hour(s))  CBC     Status: Abnormal   Collection Time: 08/06/17  4:30 PM  Result Value Ref Range   WBC 10.9 (H) 4.0 - 10.5 K/uL   RBC 3.44 (L) 3.87 - 5.11 MIL/uL   Hemoglobin 9.4 (L) 12.0 - 15.0 g/dL   HCT 29.6 (L) 36.0 - 46.0 %   MCV 86.0 78.0 - 100.0 fL   MCH 27.3 26.0 - 34.0 pg   MCHC 31.8 30.0 - 36.0 g/dL   RDW 14.0 11.5 - 15.5 %   Platelets 270 150 - 400 K/uL    Comment: Performed at [facility] [address], [place], [place] [address]  Protime-INR     Status: Abnormal   Collection Time: 08/06/17  4:30 PM  Result Value Ref Range   Prothrombin Time 19.4 (H) 11.4 - 15.2 seconds   INR 1.66     Comment: Performed at [facility] [address] [place], [place] [address]  Comprehensive metabolic panel     Status: Abnormal   Collection Time: 08/06/17  4:30 PM  Result Value Ref Range   Sodium 139 135 - 145 mmol/L   Potassium 3.5 3.5 - 5.1 mmol/L   Chloride 104 101 - 111 mmol/L   CO2 25 22 - 32 mmol/L   Glucose, Bld 175 (H) 65 - 99 mg/dL   BUN 39 (H) 6 - 20 mg/dL   Creatinine, Ser 1.55 (H) 0.44 - 1.00 mg/dL   Calcium 9.1 8.9 - 10.3 mg/dL   Total Protein 6.6 6.5 - 8.1 g/dL   Albumin 3.7 3.5 - 5.0 g/dL   AST 20 15 - 41 U/L   ALT 17 14 - 54 U/L   Alkaline Phosphatase 72 38 - 126 U/L   Total Bilirubin 0.4 0.3 - 1.2 mg/dL   GFR calc non Af Amer 30 (L) >60 mL/min   GFR calc Af Amer 35 (L) >60 mL/min    Comment: (NOTE) The eGFR has been calculated using the CKD  EPI equation. This calculation has not been validated in all clinical situations. eGFR's persistently <60 mL/min signify possible Chronic Kidney Disease.    Anion gap 10 5 - 15    Comment: Performed at [facility] [address], [place], [place] [address]  Dg Chest 1 View  Result Date: 08/06/2017 CLINICAL DATA:  84 year old female status post fall, in severe pain. Acute left femur fracture. EXAM: CHEST  1 VIEW COMPARISON:  Chest CTA and radiographs 11/04/2015 and earlier. FINDINGS: Portable AP supine view at 1712 hours. Stable cardiomegaly and mediastinal contours. Stable tortuous thoracic aorta. Visualized tracheal air column is within normal limits. Allowing for portable technique the lungs are clear. Osteopenia. No acute osseous abnormality identified. IMPRESSION: No acute cardiopulmonary abnormality. Electronically Signed   By: Genevie Ann M.D.   On: 08/06/2017 17:37   Dg Pelvis 1-2 Views  Result Date: 08/06/2017 CLINICAL DATA:  84 year old female status post fall, in severe pain. Acute left femur fracture. EXAM: PELVIS - 1-2 VIEW COMPARISON:  Left femur series today reported separately. CT Abdomen and Pelvis 08/11/2012. FINDINGS: Supine view at 1713 hours. The femoral heads remain normally located. The proximal femurs appear intact. The pelvis appears stable and intact. Sacral ala and SI joints appear stable and intact. Stable chronic lower lumbar degenerative facet hypertrophy. Negative visible bowel gas pattern. IMPRESSION: No acute fracture or dislocation identified about the pelvis. Electronically Signed   By: Genevie Ann M.D.   On: 08/06/2017 17:37   Ct Head Wo Contrast  Result Date: 08/06/2017 CLINICAL DATA:  [place] today onto LEFT side, head trauma, history atrial fibrillation, chronic anticoagulation therapy, high clinical risk, history breast cancer, asthma, hypertension, stroke EXAM: CT HEAD WITHOUT CONTRAST CT CERVICAL SPINE WITHOUT CONTRAST TECHNIQUE: Multidetector CT imaging of  the head and cervical spine was performed following the standard protocol without intravenous contrast. Multiplanar CT image reconstructions of the cervical spine were also generated. COMPARISON:  01/28/2017 CT head, CT cervical spine 08/18/2010 FINDINGS: CT HEAD FINDINGS Brain: Generalized atrophy. Normal ventricular morphology. No midline shift or mass effect. Small vessel chronic ischemic changes of deep cerebral white matter. Old RIGHT posterior parietal and LEFT cerebellar infarcts. Old lacunar infarcts BILATERAL basal ganglia. No intracranial hemorrhage, mass lesion, evidence of acute infarction, or extra-axial fluid collection. Vascular: Atherosclerotic calcification of internal carotid and vertebral arteries bilaterally at skull base Skull: Skull intact Sinuses/Orbits: Small amount of fluid/mucous in the RIGHT sphenoid sinus. Opacified LEFT ethmoid air cell. Visualized paranasal sinuses and LEFT mastoid air cells otherwise clear. Prior RIGHT mastoidectomy. Nasal septal deviation to the RIGHT. Other: N/A CT CERVICAL SPINE FINDINGS Alignment: Normal, stable Skull base and vertebrae: Diffuse osseous demineralization. Scattered facet degenerative changes. Ankylosis of the LEFT C4-C5 facet joint. Visualized skull base intact. No acute fracture or bone destruction. Soft tissues and spinal canal: Prevertebral soft tissues normal thickness. Atherosclerotic calcifications at the LEFT carotid bifurcation and proximal great vessels. Disc levels:  No additional abnormalities Upper chest: Lung apices clear Other: N/A IMPRESSION: Atrophy with small vessel chronic ischemic changes of deep cerebral white matter. Old RIGHT posterior parietal and LEFT cerebellar infarcts as well as BILATERAL basal ganglia lacunar infarcts. No acute intracranial abnormalities. Degenerative facet disease changes of the cervical spine. No acute cervical spine abnormalities. Electronically Signed   By: Lavonia Dana M.D.   On: 08/06/2017 18:04    Ct Cervical Spine Wo Contrast  Result Date: 08/06/2017 CLINICAL DATA:  [place] today onto LEFT side, head trauma, history atrial fibrillation, chronic anticoagulation therapy, high clinical risk, history breast cancer, asthma, hypertension, stroke EXAM: CT HEAD WITHOUT CONTRAST CT CERVICAL SPINE WITHOUT CONTRAST TECHNIQUE: Multidetector CT imaging of the head and cervical spine was performed following the standard protocol without intravenous contrast. Multiplanar CT image reconstructions of the cervical spine were  also generated. COMPARISON:  01/28/2017 CT head, CT cervical spine 08/18/2010 FINDINGS: CT HEAD FINDINGS Brain: Generalized atrophy. Normal ventricular morphology. No midline shift or mass effect. Small vessel chronic ischemic changes of deep cerebral white matter. Old RIGHT posterior parietal and LEFT cerebellar infarcts. Old lacunar infarcts BILATERAL basal ganglia. No intracranial hemorrhage, mass lesion, evidence of acute infarction, or extra-axial fluid collection. Vascular: Atherosclerotic calcification of internal carotid and vertebral arteries bilaterally at skull base Skull: Skull intact Sinuses/Orbits: Small amount of fluid/mucous in the RIGHT sphenoid sinus. Opacified LEFT ethmoid air cell. Visualized paranasal sinuses and LEFT mastoid air cells otherwise clear. Prior RIGHT mastoidectomy. Nasal septal deviation to the RIGHT. Other: N/A CT CERVICAL SPINE FINDINGS Alignment: Normal, stable Skull base and vertebrae: Diffuse osseous demineralization. Scattered facet degenerative changes. Ankylosis of the LEFT C4-C5 facet joint. Visualized skull base intact. No acute fracture or bone destruction. Soft tissues and spinal canal: Prevertebral soft tissues normal thickness. Atherosclerotic calcifications at the LEFT carotid bifurcation and proximal great vessels. Disc levels:  No additional abnormalities Upper chest: Lung apices clear Other: N/A IMPRESSION: Atrophy with small vessel chronic ischemic  changes of deep cerebral white matter. Old RIGHT posterior parietal and LEFT cerebellar infarcts as well as BILATERAL basal ganglia lacunar infarcts. No acute intracranial abnormalities. Degenerative facet disease changes of the cervical spine. No acute cervical spine abnormalities. Electronically Signed   By: Lavonia Dana M.D.   On: 08/06/2017 18:04   Dg Femur Min 2 Views Left  Result Date: 08/06/2017 CLINICAL DATA:  84 year old female status post fall, in severe pain. Externally rotated left lower leg. EXAM: LEFT FEMUR 2 VIEWS COMPARISON:  Left tib-fib series 06/10/2009. FINDINGS: The left femoral head remains normally located in the proximal left femur appears intact. The left hemipelvis appears intact. Osteopenia. Spiral comminuted, displaced, and impacted fracture of the distal left femur metadiaphysis. The fracture extends to involve left total knee femoral hardware. There is a large 9 centimeter butterfly fragment projecting obliquely over the distal femoral shaft. There is greater than 1 full shaft width posterior displacement and superimposed posterior angulation. There is also rotation of the distal fragment. IMPRESSION: 1. Severely comminuted fracture of the distal left femur metadiaphysis extending to involve preexisting left total knee hardware. Greater than 1 full shaft width posterior displacement with superimposed angulation and rotation of the distal fragment. 2. The mid and proximal left femur remain intact. Electronically Signed   By: Genevie Ann M.D.   On: 08/06/2017 17:36    Independent reviews of the x-rays of her left distal femur show a complex distal femur fracture that is proximal to a total knee arthroplasty.  She has significant osteopenic bone as well.  ROS Blood pressure (!) 134/55, pulse 87, temperature 98.2 F (36.8 C), temperature source Oral, resp. rate 19, height 5' 4.5" (1.638 m), weight 161 lb (73 kg), SpO2 97 %. Physical Exam  Constitutional: She is oriented to person,  place, and time. She appears well-developed and well-nourished.  Neck: Normal range of motion. Neck supple.  Cardiovascular: Normal rate.  Respiratory: Effort normal.  GI: Soft.  Musculoskeletal:       Left knee: She exhibits decreased range of motion, swelling, deformity and bony tenderness.  Neurological: She is alert and oriented to person, place, and time.  Skin: Skin is warm and dry.   Her left foot is well-perfused with normal sensation.  Her left calf is soft.  She is able to move her ankle and toes.   Assessment/Plan: Left periprosthetic distal  femur fracture proximal to left total knee arthroplasty  I will order a CT scan of the left distal femur and knee to better evaluate the fracture and her implant.  She understands that surgery will not be this weekend due to the complex nature of this fracture.  She will be placed in the immobilizer and will have strict nonweightbearing on that left knee.  Ice can be used for swelling as well.  All questions concerns were answered and addressed.  We will follow her closely and will fully informed the family when the surgical plan and timing is made.  For now she can eat from an orthopedic standpoint.  Mcarthur Rossetti 08/06/2017, 7:13 PM

## 2017-08-06 NOTE — ED Notes (Signed)
Purewick applied to patient. Device explained to patient. Patient able to verbalize understanding. Tolerated well.

## 2017-08-06 NOTE — ED Notes (Signed)
Patient transported to x-ray again.

## 2017-08-06 NOTE — ED Notes (Signed)
Patient transported to xray/CT. 

## 2017-08-06 NOTE — ED Provider Notes (Signed)
[facility] EMERGENCY DEPARTMENT Provider Note   CSN: [id number] Arrival date & time: 08/06/17  1608     History   Chief Complaint Chief Complaint  Patient presents with  . Hip Pain    HPI [name] is a 84 y.o. female.  HPI 84 year old female, history of breast cancer, history of A. fib on Coumadin, presents today with left knee pain after fall.  She fell today from a standing height after becoming somewhat dizzy.  She denies any acute neurological changes.  She is on Coumadin and may have struck her head.  She has a history of right-sided facial droop from surgery as child.  Not noted any new neurological abnormalities.  Recent illness. Past Medical History:  Diagnosis Date  . Arthritis    "fingers, hips, feet, ankles" (11/10/2012)  . Asthma   . Atrial fibrillation (HCC)    CHRONIC COUMADIN  . Breast cancer ([place]) 1990's   "cancer on one side; precancerous tissue on the other" (11/10/2012)  . Chronic bronchitis ([place])    "multiple times; not in a long time" (11/10/2012  . Depression   . GERD (gastroesophageal reflux disease)   . Hearing impaired   . Hypertension   . Irritable bowel   . Lymphedema    HX OF - IN LEFT ARM--NO NEEDLES OR B/P'S LEFT ARM  . Macular degeneration    "BEGINNINGS OF" MACULAR DEGENERATION  . Osteoporosis   . Pneumonia    "multiple times; not in a long time" (11/10/2012)  . Recurrent UTI   . Sleep apnea    "dx'd w/it; don't wear mask or anything" (11/10/2012)  . Stroke ([place]) ~ 2005   HX OF TIA-NO RESIDUAL PROBLEM--PARALYSIS RT SIDE FACE /PT'S MOUTH DROOPS-AND LOSS OF HEARING RT EAR --SINCE EAR SURGERY AS A CHILD   . UTI (lower urinary tract infection)    FREQUENT    Patient Active Problem List   Diagnosis Date Noted  . Anemia 11/10/2012    Class: Chronic  . Syncope 11/09/2012  . Urinary tract infection, recurrent 11/09/2012  . Hyponatremia 11/09/2012  . Diarrhea 11/09/2012  . Paroxysmal atrial fibrillation ([place])  11/09/2012  . Hypertension 11/09/2012  . Synovitis of knee 04/07/2012  . Postop Hypokalemia 09/23/2011  . Postop Acute blood loss anemia 09/21/2011  . Postop Transfusion 09/21/2011  . OA (osteoarthritis) of knee 09/20/2011    Past Surgical History:  Procedure Laterality Date  . BREAST BIOPSY Bilateral 1990's  . Dacryocystorhinostomy  02/18/2016  . INNER EAR SURGERY Right    MULTIPLE EAR SURGERIES,  . JOINT REPLACEMENT    . KNEE ARTHROSCOPY  04/07/2012   Procedure: ARTHROSCOPY KNEE;  Surgeon: Gearlean Alf, MD;  Location[facility];  Service: Orthopedics;  Laterality: Left;  WITH SYNOVECTOMY  . MASTECTOMY Bilateral 1990's  . MASTOIDECTOMY Right 1942  . TONSILLECTOMY    . TOTAL KNEE ARTHROPLASTY  09/20/2011   LEFT TOTAL KNEE ARTHROPLASTY;  Surgeon: Gearlean Alf, MD;  Location: [facility];  Service: Orthopedics;  Laterality: Left;  . TOTAL KNEE ARTHROPLASTY  2006     OB History   None      Home Medications    Prior to Admission medications   Medication Sig Start Date End Date Taking? Authorizing Provider  acetaminophen (TYLENOL) 500 MG tablet Take 500 mg by mouth at bedtime.    [provider]  atorvastatin (LIPITOR) 20 MG tablet Take 20 mg by mouth daily.    [provider]  chlorthalidone (  HYGROTON) 50 MG tablet Take 50 mg by mouth every morning.    [provider]  CRANBERRY PO Take 1 capsule by mouth 2 (two) times daily.     [provider]  diazepam (VALIUM) 2 MG tablet Take 1 tablet (2 mg total) by mouth every 8 (eight) hours as needed (dizziness). 01/09/17   Sherwood Gambler, MD  estradiol (ESTRACE) 0.1 MG/GM vaginal cream Place 1 Applicatorful vaginally as directed. Twice a week    [provider]  loperamide (IMODIUM) 2 MG capsule Take 2 mg by mouth as needed for diarrhea or loose stools.    [provider]  losartan (COZAAR) 100 MG tablet Take 100 mg by mouth daily.    [provider]    metoprolol succinate (TOPROL-XL) 25 MG 24 hr tablet Take 1 tablet (25 mg total) by mouth daily. Patient taking differently: Take 25 mg by mouth 2 (two) times daily.  11/10/12   Leanna Battles, MD  mirabegron ER (MYRBETRIQ) 25 MG TB24 tablet Take 25 mg by mouth every evening.    [provider]  Multiple Vitamin (MULTIVITAMIN) capsule Take 1 capsule by mouth daily.    [provider]  Multiple Vitamins-Minerals (PRESERVISION/LUTEIN) CAPS Take 1 capsule by mouth daily.     [provider]  ondansetron (ZOFRAN ODT) 4 MG disintegrating tablet Take 1 tablet (4 mg total) by mouth every 8 (eight) hours as needed for nausea or vomiting. 01/09/17   Sherwood Gambler, MD  pantoprazole (PROTONIX) 40 MG tablet Take 40 mg by mouth daily.    [provider]  Polyethyl Glycol-Propyl Glycol (SYSTANE) 0.4-0.3 % GEL ophthalmic gel Place 1 application into both eyes at bedtime.    [provider]  Polyethyl Glycol-Propyl Glycol (SYSTANE) 0.4-0.3 % SOLN Place 1 drop into both eyes at bedtime.     [provider]  Probiotic Product (VSL#3 PO) Take 1 capsule by mouth 2 (two) times daily. Take 2 tablets by mouth twice daily     [provider]  saccharomyces boulardii (FLORASTOR) 250 MG capsule Take 250 mg by mouth 2 (two) times daily.    [provider]  warfarin (COUMADIN) 5 MG tablet Take 5-7.5 mg by mouth See admin instructions. 5mg  by mouth daily, except 7.5mg  on Saturdays 09/23/11   Joelene Millin, PA-C    Family History No family history on file.  Social History Social History   Tobacco Use  . Smoking status: Never Smoker  . Smokeless tobacco: Never Used  Substance Use Topics  . Alcohol use: No  . Drug use: No     Allergies   Sulfamethoxazole; Ampicillin; Clindamycin/lincomycin; Latex; Pradaxa [dabigatran etexilate mesylate]; and Ciprofloxacin   Review of Systems Review of Systems  Constitutional: Negative.   HENT:  Negative.   Eyes: Negative.   Respiratory: Negative.   Cardiovascular: Negative.   Gastrointestinal: Negative.   Genitourinary: Negative.   Neurological: Positive for dizziness and facial asymmetry. Negative for tremors, seizures, syncope, speech difficulty, weakness, light-headedness, numbness and headaches.  Hematological: Negative.   Psychiatric/Behavioral: Negative.   All other systems reviewed and are negative.    Physical Exam Updated Vital Signs BP (!) 147/68   Pulse 74   Temp 98.2 F (36.8 C) (Oral)   Resp 14   Ht 1.638 m (5' 4.5")   Wt 73 kg (161 lb)   SpO2 99%   BMI 27.21 kg/m   Physical Exam  Constitutional: She is oriented to person, place, and time. She appears well-developed  and well-nourished. No distress.  HENT:  Head: Normocephalic and atraumatic.  Right Ear: External ear normal.  Left Ear: External ear normal.  Eyes: Pupils are equal, round, and reactive to light. EOM are normal.  Neck: Normal range of motion. Neck supple.  Cardiovascular: Normal rate and regular rhythm.  Pulmonary/Chest: Effort normal and breath sounds normal.  Abdominal: Soft. Bowel sounds are normal.  Musculoskeletal:  Left hip flexed, lle shortened Sensation intact distal to injury dp pulse intact left foot Motor intact left foot Swelling and ttp left mid femur No significant ttp left hip Unable to move left hip or knee due to pain  Neurological: She is alert and oriented to person, place, and time. She displays normal reflexes. A cranial nerve deficit is present. No sensory deficit. She exhibits normal muscle tone. Coordination normal.  Right facial droop  Skin: Skin is warm. Capillary refill takes less than 2 seconds.  Psychiatric: She has a normal mood and affect.  Vitals reviewed.    ED Treatments / Results  Labs (all labs ordered are listed, but only abnormal results are displayed) Labs Reviewed  CBC - Abnormal; Notable for the following components:      Result  Value   WBC 10.9 (*)    RBC 3.44 (*)    Hemoglobin 9.4 (*)    HCT 29.6 (*)    All other components within normal limits  PROTIME-INR - Abnormal; Notable for the following components:   Prothrombin Time 19.4 (*)    All other components within normal limits  COMPREHENSIVE METABOLIC PANEL    EKG EKG Interpretation  Date/Time:  Saturday Aug 06 2017 16:16:45 EDT Ventricular Rate:  70 PR Interval:    QRS Duration: 115 QT Interval:  411 QTC Calculation: 444 R Axis:   19 Text Interpretation:  Sinus rhythm LVH with secondary repolarization abnormality Inferior infarct, age indeterminate Artifact in lead(s) I II III aVR aVL aVF V1 V2  Poor data quality, interpretation may be adversely affected No significant change since last tracing Confirmed by Pattricia Boss [phone]) on 08/06/2017 5:26:27 PM   Radiology Dg Chest 1 View  Result Date: 08/06/2017 CLINICAL DATA:  84 year old female status post fall, in severe pain. Acute left femur fracture. EXAM: CHEST  1 VIEW COMPARISON:  Chest CTA and radiographs 11/04/2015 and earlier. FINDINGS: Portable AP supine view at 1712 hours. Stable cardiomegaly and mediastinal contours. Stable tortuous thoracic aorta. Visualized tracheal air column is within normal limits. Allowing for portable technique the lungs are clear. Osteopenia. No acute osseous abnormality identified. IMPRESSION: No acute cardiopulmonary abnormality. Electronically Signed   By: Genevie Ann M.D.   On: 08/06/2017 17:37   Dg Pelvis 1-2 Views  Result Date: 08/06/2017 CLINICAL DATA:  84 year old female status post fall, in severe pain. Acute left femur fracture. EXAM: PELVIS - 1-2 VIEW COMPARISON:  Left femur series today reported separately. CT Abdomen and Pelvis 08/11/2012. FINDINGS: Supine view at 1713 hours. The femoral heads remain normally located. The proximal femurs appear intact. The pelvis appears stable and intact. Sacral ala and SI joints appear stable and intact. Stable chronic lower  lumbar degenerative facet hypertrophy. Negative visible bowel gas pattern. IMPRESSION: No acute fracture or dislocation identified about the pelvis. Electronically Signed   By: Genevie Ann M.D.   On: 08/06/2017 17:37   Ct Head Wo Contrast  Result Date: 08/06/2017 CLINICAL DATA:  [place] today onto LEFT side, head trauma, history atrial fibrillation, chronic anticoagulation therapy, high clinical risk, history breast cancer, asthma, hypertension,  stroke EXAM: CT HEAD WITHOUT CONTRAST CT CERVICAL SPINE WITHOUT CONTRAST TECHNIQUE: Multidetector CT imaging of the head and cervical spine was performed following the standard protocol without intravenous contrast. Multiplanar CT image reconstructions of the cervical spine were also generated. COMPARISON:  01/28/2017 CT head, CT cervical spine 08/18/2010 FINDINGS: CT HEAD FINDINGS Brain: Generalized atrophy. Normal ventricular morphology. No midline shift or mass effect. Small vessel chronic ischemic changes of deep cerebral white matter. Old RIGHT posterior parietal and LEFT cerebellar infarcts. Old lacunar infarcts BILATERAL basal ganglia. No intracranial hemorrhage, mass lesion, evidence of acute infarction, or extra-axial fluid collection. Vascular: Atherosclerotic calcification of internal carotid and vertebral arteries bilaterally at skull base Skull: Skull intact Sinuses/Orbits: Small amount of fluid/mucous in the RIGHT sphenoid sinus. Opacified LEFT ethmoid air cell. Visualized paranasal sinuses and LEFT mastoid air cells otherwise clear. Prior RIGHT mastoidectomy. Nasal septal deviation to the RIGHT. Other: N/A CT CERVICAL SPINE FINDINGS Alignment: Normal, stable Skull base and vertebrae: Diffuse osseous demineralization. Scattered facet degenerative changes. Ankylosis of the LEFT C4-C5 facet joint. Visualized skull base intact. No acute fracture or bone destruction. Soft tissues and spinal canal: Prevertebral soft tissues normal thickness. Atherosclerotic  calcifications at the LEFT carotid bifurcation and proximal great vessels. Disc levels:  No additional abnormalities Upper chest: Lung apices clear Other: N/A IMPRESSION: Atrophy with small vessel chronic ischemic changes of deep cerebral white matter. Old RIGHT posterior parietal and LEFT cerebellar infarcts as well as BILATERAL basal ganglia lacunar infarcts. No acute intracranial abnormalities. Degenerative facet disease changes of the cervical spine. No acute cervical spine abnormalities. Electronically Signed   By: Lavonia Dana M.D.   On: 08/06/2017 18:04   Ct Cervical Spine Wo Contrast  Result Date: 08/06/2017 CLINICAL DATA:  [place] today onto LEFT side, head trauma, history atrial fibrillation, chronic anticoagulation therapy, high clinical risk, history breast cancer, asthma, hypertension, stroke EXAM: CT HEAD WITHOUT CONTRAST CT CERVICAL SPINE WITHOUT CONTRAST TECHNIQUE: Multidetector CT imaging of the head and cervical spine was performed following the standard protocol without intravenous contrast. Multiplanar CT image reconstructions of the cervical spine were also generated. COMPARISON:  01/28/2017 CT head, CT cervical spine 08/18/2010 FINDINGS: CT HEAD FINDINGS Brain: Generalized atrophy. Normal ventricular morphology. No midline shift or mass effect. Small vessel chronic ischemic changes of deep cerebral white matter. Old RIGHT posterior parietal and LEFT cerebellar infarcts. Old lacunar infarcts BILATERAL basal ganglia. No intracranial hemorrhage, mass lesion, evidence of acute infarction, or extra-axial fluid collection. Vascular: Atherosclerotic calcification of internal carotid and vertebral arteries bilaterally at skull base Skull: Skull intact Sinuses/Orbits: Small amount of fluid/mucous in the RIGHT sphenoid sinus. Opacified LEFT ethmoid air cell. Visualized paranasal sinuses and LEFT mastoid air cells otherwise clear. Prior RIGHT mastoidectomy. Nasal septal deviation to the RIGHT. Other: N/A  CT CERVICAL SPINE FINDINGS Alignment: Normal, stable Skull base and vertebrae: Diffuse osseous demineralization. Scattered facet degenerative changes. Ankylosis of the LEFT C4-C5 facet joint. Visualized skull base intact. No acute fracture or bone destruction. Soft tissues and spinal canal: Prevertebral soft tissues normal thickness. Atherosclerotic calcifications at the LEFT carotid bifurcation and proximal great vessels. Disc levels:  No additional abnormalities Upper chest: Lung apices clear Other: N/A IMPRESSION: Atrophy with small vessel chronic ischemic changes of deep cerebral white matter. Old RIGHT posterior parietal and LEFT cerebellar infarcts as well as BILATERAL basal ganglia lacunar infarcts. No acute intracranial abnormalities. Degenerative facet disease changes of the cervical spine. No acute cervical spine abnormalities. Electronically Signed   By: Crist Infante.D.  On: 08/06/2017 18:04   Dg Femur Min 2 Views Left  Result Date: 08/06/2017 CLINICAL DATA:  84 year old female status post fall, in severe pain. Externally rotated left lower leg. EXAM: LEFT FEMUR 2 VIEWS COMPARISON:  Left tib-fib series 06/10/2009. FINDINGS: The left femoral head remains normally located in the proximal left femur appears intact. The left hemipelvis appears intact. Osteopenia. Spiral comminuted, displaced, and impacted fracture of the distal left femur metadiaphysis. The fracture extends to involve left total knee femoral hardware. There is a large 9 centimeter butterfly fragment projecting obliquely over the distal femoral shaft. There is greater than 1 full shaft width posterior displacement and superimposed posterior angulation. There is also rotation of the distal fragment. IMPRESSION: 1. Severely comminuted fracture of the distal left femur metadiaphysis extending to involve preexisting left total knee hardware. Greater than 1 full shaft width posterior displacement with superimposed angulation and rotation of  the distal fragment. 2. The mid and proximal left femur remain intact. Electronically Signed   By: Genevie Ann M.D.   On: 08/06/2017 17:36    Procedures Procedures (including critical care time)  Medications Ordered in ED Medications  sodium chloride 0.9 % bolus 500 mL (500 mLs Intravenous New Bag/Given 08/06/17 1647)  morphine 4 MG/ML injection 2 mg (2 mg Intravenous Given 08/06/17 1650)     Initial Impression / Assessment and Plan / ED Course  I have reviewed the triage vital signs and the nursing notes.  Pertinent labs & imaging results that were available during my care of the patient were reviewed by me and considered in my medical decision making (see chart for details). 6:22 PM Immobilizer placed.  Post immobilizer placement pulses intact sensation intact    1 fall-felt lightheaded and had fall.  Work-up here reveals no new EKG changes.  Some increased BUN with underlying stable appearing renal insufficiency patient on multiple medications which could contribute to this including losartan, metoprolol.  Will need ongoing telemetry 2- left distal femur fracture secondary to #1 Patient had right knee done by Dr. Maureen Ralphs, left knee done at [facility].  Spoke with Ortho on call and advised to call on-call for reasons for orthopedics.  Discussed with Dr. being who defers back to primary on-call.` Gust with Dr. Ninfa Linden on-call for orthopedics and they will see tomorrow.  Requested CT knee 3- anemia appears stable from prior 4 on chronic anticoagulation- inr 1.66 today with plan hold Coumadin 5-cri- appears stable from prior Discussed with Dr. Roel Cluck and will admit Final Clinical Impressions(s) / ED Diagnoses   Final diagnoses:  Closed fracture of distal end of left femur, unspecified fracture morphology, initial encounter [facility])    ED Discharge Orders    None       Pattricia Boss, MD 08/06/17 1926

## 2017-08-06 NOTE — ED Notes (Signed)
Husband would appreciate call from the ortho: Adelma Bowdoin @ [phone]

## 2017-08-07 ENCOUNTER — Inpatient Hospital Stay (HOSPITAL_COMMUNITY): Payer: Medicare Other

## 2017-08-07 DIAGNOSIS — I480 Paroxysmal atrial fibrillation: Secondary | ICD-10-CM

## 2017-08-07 DIAGNOSIS — Z01810 Encounter for preprocedural cardiovascular examination: Secondary | ICD-10-CM

## 2017-08-07 DIAGNOSIS — Z7901 Long term (current) use of anticoagulants: Secondary | ICD-10-CM

## 2017-08-07 DIAGNOSIS — D649 Anemia, unspecified: Secondary | ICD-10-CM

## 2017-08-07 LAB — FOLATE: Folate: 38.4 ng/mL (ref >5.9)

## 2017-08-07 LAB — TROPONIN I
Troponin I: <0.03 ng/mL (ref <0.03)
Troponin I: 0.03 ng/mL (ref <0.03)
Troponin I: 0.03 ng/mL (ref <0.03)

## 2017-08-07 LAB — BASIC METABOLIC PANEL
Anion gap: 8 (ref 5–15)
BUN: 31 mg/dL — ABNORMAL HIGH (ref 6–20)
CO2: 23 mmol/L (ref 22–32)
Calcium: 8.2 mg/dL — ABNORMAL LOW (ref 8.9–10.3)
Chloride: 105 mmol/L (ref 101–111)
Creatinine, Ser: 1.27 mg/dL — ABNORMAL HIGH (ref 0.44–1.00)
GFR calc Af Amer: 44 mL/min — ABNORMAL LOW (ref >60)
GFR calc non Af Amer: 38 mL/min — ABNORMAL LOW (ref >60)
Glucose, Bld: 124 mg/dL — ABNORMAL HIGH (ref 65–99)
Potassium: 3.9 mmol/L (ref 3.5–5.1)
Sodium: 136 mmol/L (ref 135–145)

## 2017-08-07 LAB — PREPARE RBC (CROSSMATCH)

## 2017-08-07 LAB — URINALYSIS, ROUTINE W REFLEX MICROSCOPIC
Bilirubin Urine: NEGATIVE
Glucose, UA: NEGATIVE mg/dL
Hgb urine dipstick: NEGATIVE
Ketones, ur: NEGATIVE mg/dL
Nitrite: POSITIVE — AB
Protein, ur: NEGATIVE mg/dL
Specific Gravity, Urine: 1.012 (ref 1.005–1.030)
pH: 9 — ABNORMAL HIGH (ref 5.0–8.0)

## 2017-08-07 LAB — CBC
HCT: 22.2 % — ABNORMAL LOW (ref 36.0–46.0)
Hemoglobin: 7.1 g/dL — ABNORMAL LOW (ref 12.0–15.0)
MCH: 27.3 pg (ref 26.0–34.0)
MCHC: 32 g/dL (ref 30.0–36.0)
MCV: 85.4 fL (ref 78.0–100.0)
Platelets: 236 10*3/uL (ref 150–400)
RBC: 2.6 MIL/uL — ABNORMAL LOW (ref 3.87–5.11)
RDW: 14 % (ref 11.5–15.5)
WBC: 9.3 10*3/uL (ref 4.0–10.5)

## 2017-08-07 LAB — IRON AND TIBC
Iron: 19 ug/dL — ABNORMAL LOW (ref 28–170)
Saturation Ratios: 8 % — ABNORMAL LOW (ref 10.4–31.8)
TIBC: 238 ug/dL — ABNORMAL LOW (ref 250–450)
UIBC: 219 ug/dL

## 2017-08-07 LAB — ABO/RH: ABO/RH(D): A POS

## 2017-08-07 LAB — FERRITIN: Ferritin: 123 ng/mL (ref 11–307)

## 2017-08-07 LAB — RETICULOCYTES
RBC.: 2.87 MIL/uL — ABNORMAL LOW (ref 3.87–5.11)
Retic Count, Absolute: 34.4 10*3/uL (ref 19.0–186.0)
Retic Ct Pct: 1.2 % (ref 0.4–3.1)

## 2017-08-07 LAB — ALBUMIN: Albumin: 3.1 g/dL — ABNORMAL LOW (ref 3.5–5.0)

## 2017-08-07 LAB — VITAMIN B12: Vitamin B-12: 620 pg/mL (ref 180–914)

## 2017-08-07 MED ORDER — SACCHAROMYCES BOULARDII 250 MG PO CAPS
250.0000 mg | ORAL_CAPSULE | Freq: Two times a day (BID) | ORAL | Status: DC
  Administered 2017-08-07 – 2017-08-11 (×8): 250 mg via ORAL
  Filled 2017-08-07 (×9): qty 1

## 2017-08-07 MED ORDER — METOPROLOL SUCCINATE ER 25 MG PO TB24
25.0000 mg | ORAL_TABLET | Freq: Two times a day (BID) | ORAL | Status: DC
  Administered 2017-08-07 – 2017-08-11 (×9): 25 mg via ORAL
  Filled 2017-08-07 (×10): qty 1

## 2017-08-07 MED ORDER — SODIUM CHLORIDE 0.9 % IV SOLN
Freq: Once | INTRAVENOUS | Status: AC
  Administered 2017-08-10: 07:00:00 via INTRAVENOUS

## 2017-08-07 MED ORDER — SODIUM CHLORIDE 0.9 % IV SOLN
Freq: Once | INTRAVENOUS | Status: AC
  Administered 2017-08-07: 14:00:00 via INTRAVENOUS

## 2017-08-07 MED ORDER — METHOCARBAMOL 1000 MG/10ML IJ SOLN
500.0000 mg | Freq: Four times a day (QID) | INTRAVENOUS | Status: DC | PRN
  Filled 2017-08-07: qty 5

## 2017-08-07 MED ORDER — HYDROCODONE-ACETAMINOPHEN 5-325 MG PO TABS
1.0000 | ORAL_TABLET | Freq: Four times a day (QID) | ORAL | Status: DC | PRN
  Administered 2017-08-08 – 2017-08-09 (×3): 2 via ORAL
  Administered 2017-08-10 – 2017-08-11 (×4): 1 via ORAL
  Filled 2017-08-07: qty 1
  Filled 2017-08-07: qty 2
  Filled 2017-08-07: qty 1
  Filled 2017-08-07: qty 2
  Filled 2017-08-07 (×2): qty 1
  Filled 2017-08-07: qty 2
  Filled 2017-08-07 (×2): qty 1

## 2017-08-07 MED ORDER — PANTOPRAZOLE SODIUM 40 MG PO TBEC
40.0000 mg | DELAYED_RELEASE_TABLET | Freq: Every day | ORAL | Status: DC
  Administered 2017-08-07 – 2017-08-11 (×5): 40 mg via ORAL
  Filled 2017-08-07 (×5): qty 1

## 2017-08-07 MED ORDER — METHOCARBAMOL 500 MG PO TABS
500.0000 mg | ORAL_TABLET | Freq: Four times a day (QID) | ORAL | Status: DC | PRN
  Administered 2017-08-08 – 2017-08-11 (×4): 500 mg via ORAL
  Filled 2017-08-07 (×5): qty 1

## 2017-08-07 MED ORDER — MORPHINE SULFATE (PF) 4 MG/ML IV SOLN
4.0000 mg | INTRAVENOUS | Status: DC | PRN
  Administered 2017-08-07 – 2017-08-08 (×5): 4 mg via INTRAVENOUS
  Filled 2017-08-07 (×5): qty 1

## 2017-08-07 MED ORDER — MORPHINE SULFATE (PF) 4 MG/ML IV SOLN
2.0000 mg | Freq: Once | INTRAVENOUS | Status: AC
  Administered 2017-08-07: 2 mg via INTRAVENOUS

## 2017-08-07 MED ORDER — FAMOTIDINE 20 MG PO TABS
10.0000 mg | ORAL_TABLET | Freq: Every day | ORAL | Status: DC
  Administered 2017-08-07 – 2017-08-11 (×5): 10 mg via ORAL
  Filled 2017-08-07 (×6): qty 1

## 2017-08-07 MED ORDER — ATORVASTATIN CALCIUM 20 MG PO TABS
20.0000 mg | ORAL_TABLET | Freq: Every day | ORAL | Status: DC
  Administered 2017-08-08 – 2017-08-10 (×3): 20 mg via ORAL
  Filled 2017-08-07 (×3): qty 1

## 2017-08-07 NOTE — ED Notes (Signed)
Paged admit provider regarding critical value.

## 2017-08-07 NOTE — ED Notes (Signed)
Doctor at bedside.

## 2017-08-07 NOTE — Consult Note (Addendum)
Cardiology Consult    Patient ID: Nakoma Gotwalt; [id number]; September 22, 1933   Admit date: 08/06/2017 Date of Consult: 08/07/2017  Primary Care Provider: Leanna Battles, MD Primary Cardiologist: Ezzard Standing, MD    Patient Profile    Mette Southgate is a 84 y.o. female with past medical history of PAF (on Coumadin), HTN, HLD, GERD, and prior CVA who is being seen today for the evaluation of preoperative cardiac clearance at the request of Dr. Zigmund Daniel.   History of Present Illness    Ms. Akhtar presented to [facility] ED on 08/06/2017 following a fall and landing on her left hip. Denies any actual loss of consciousness. Says she had experienced weakness with associated diarrhea over the past several days and had been experiencing balance issues. She denies any associated chest pain, dyspnea, dizziness, or presyncope. She ambulates with a cane when away from her home but exercises daily on a treadmill, walking at 1.4 - 1.5 mph for up to 15 minutes. Denies any anginal symptoms with this.   She has known PAF but denies any history of CAD.   Initial labs show WBC 10.9, Hgb 9.4 (now down to 7.1), platelets 270, Na+ 139, K+ 3.5, and creatinine 1.55. INR 1.66. Initial and cyclic troponin values have been flat at 0.03. Plain film shows severely comminuted fracture of the distal left femur metadiaphysis extending to involve preexisting left total knee hardware with greater than 1 full shaft width posterior displacement with superimposed angulation and rotation of the distal fragment. CXR with no acute cardiopulmonary findings. CT Head showing small vessel chronic ischemic changes and prior infarcts but no acute intracranial abnormalities. EKG shows NSR, HR 70, and baseline artifact with no acute ST changes when compared to prior tracings.   Orthopedics was consulted and are planning for surgical repair in the coming days given the complexity of her fracture.    Past Medical History:  Diagnosis Date    . Arthritis    "fingers, hips, feet, ankles" (11/10/2012)  . Asthma   . Atrial fibrillation (HCC)    CHRONIC COUMADIN  . Breast cancer ([place]) 1990's   "cancer on one side; precancerous tissue on the other" (11/10/2012)  . Chronic bronchitis ([place])    "multiple times; not in a long time" (11/10/2012  . Depression   . GERD (gastroesophageal reflux disease)   . Hearing impaired   . Hypertension   . Irritable bowel   . Lymphedema    HX OF - IN LEFT ARM--NO NEEDLES OR B/P'S LEFT ARM  . Macular degeneration    "BEGINNINGS OF" MACULAR DEGENERATION  . Osteoporosis   . Pneumonia    "multiple times; not in a long time" (11/10/2012)  . Recurrent UTI   . Sleep apnea    "dx'd w/it; don't wear mask or anything" (11/10/2012)  . Stroke ([place]) ~ 2005   HX OF TIA-NO RESIDUAL PROBLEM--PARALYSIS RT SIDE FACE /PT'S MOUTH DROOPS-AND LOSS OF HEARING RT EAR --SINCE EAR SURGERY AS A CHILD   . UTI (lower urinary tract infection)    FREQUENT    Past Surgical History:  Procedure Laterality Date  . BREAST BIOPSY Bilateral 1990's  . Dacryocystorhinostomy  02/18/2016  . INNER EAR SURGERY Right    MULTIPLE EAR SURGERIES,  . JOINT REPLACEMENT    . KNEE ARTHROSCOPY  04/07/2012   Procedure: ARTHROSCOPY KNEE;  Surgeon: Gearlean Alf, MD;  Location: [facility];  Service: Orthopedics;  Laterality: Left;  WITH SYNOVECTOMY  . MASTECTOMY Bilateral  1990's  . MASTOIDECTOMY Right 1942  . TONSILLECTOMY    . TOTAL KNEE ARTHROPLASTY  09/20/2011   LEFT TOTAL KNEE ARTHROPLASTY;  Surgeon: Gearlean Alf, MD;  Location: [facility];  Service: Orthopedics;  Laterality: Left;  . TOTAL KNEE ARTHROPLASTY  2006     Home Medications:  Prior to Admission medications   Medication Sig Start Date End Date Taking? Authorizing Provider  atorvastatin (LIPITOR) 20 MG tablet Take 20 mg by mouth daily after supper.    Yes [provider]  chlorthalidone (HYGROTON) 50 MG tablet Take 50 mg by mouth daily.    Yes  [provider]  CRANBERRY PO Take 1 capsule by mouth 2 (two) times daily.    Yes [provider]  diphenhydramine-acetaminophen (TYLENOL PM) 25-500 MG TABS tablet Take 1 tablet by mouth at bedtime.   Yes [provider]  estradiol (ESTRACE) 0.1 MG/GM vaginal cream Place 1 Applicatorful vaginally See admin instructions. Apply vaginally twice weekly   Yes [provider]  loperamide (IMODIUM) 2 MG capsule Take 2-4 mg by mouth 5 (five) times daily as needed for diarrhea or loose stools.    Yes [provider]  losartan (COZAAR) 100 MG tablet Take 100 mg by mouth daily.   Yes [provider]  metoprolol succinate (TOPROL-XL) 25 MG 24 hr tablet Take 1 tablet (25 mg total) by mouth daily. Patient taking differently: Take 25 mg by mouth 2 (two) times daily.  11/10/12  Yes Leanna Battles, MD  Multiple Vitamin (MULTIVITAMIN WITH MINERALS) TABS tablet Take 1 tablet by mouth daily.   Yes [provider]  Multiple Vitamins-Minerals (PRESERVISION/LUTEIN) CAPS Take 1 capsule by mouth daily.    Yes [provider]  pantoprazole (PROTONIX) 40 MG tablet Take 40 mg by mouth daily.   Yes [provider]  Polyethyl Glycol-Propyl Glycol (SYSTANE) 0.4-0.3 % GEL ophthalmic gel Place 1 application into both eyes See admin instructions. Apply to both eyes every other night at bedtime (alternate with drops)   Yes [provider]  Polyethyl Glycol-Propyl Glycol (SYSTANE) 0.4-0.3 % SOLN Place 1 drop into both eyes See admin instructions. Instill one drop into both eyes every other night at bedtime (alternate with gel)   Yes [provider]  Probiotic Product (VSL#3 PO) Take 1 capsule by mouth daily.    Yes [provider]  ranitidine (ZANTAC) 150 MG capsule Take 150 mg by mouth daily as needed for heartburn.  07/29/17  Yes [provider]  saccharomyces boulardii (FLORASTOR) 250 MG capsule Take 250-500 mg by mouth  See admin instructions. Take one tablet (250 mg) by mouth every morning and two tablets (500 mg) at night   Yes [provider]  warfarin (COUMADIN) 5 MG tablet Take 5 mg by mouth daily with supper. 5mg  by mouth daily, except 7.5mg  on Saturdays 09/23/11  Yes Perkins, Alexzandrew L, PA-C  diazepam (VALIUM) 2 MG tablet Take 1 tablet (2 mg total) by mouth every 8 (eight) hours as needed (dizziness). Patient not taking: Reported on 08/06/2017 01/09/17   Sherwood Gambler, MD  mirabegron ER (MYRBETRIQ) 25 MG TB24 tablet Take 25 mg by mouth every evening.    [provider]  ondansetron (ZOFRAN ODT) 4 MG disintegrating tablet Take 1 tablet (4 mg total) by mouth every 8 (eight) hours as needed for nausea or vomiting. Patient not taking: Reported on 08/06/2017 01/09/17   Sherwood Gambler, MD    Inpatient Medications: Scheduled Meds: . atorvastatin  20 mg  Oral QPC supper  . famotidine  10 mg Oral Daily  . metoprolol succinate  25 mg Oral BID  . pantoprazole  40 mg Oral Daily  . saccharomyces boulardii  250 mg Oral BID   Continuous Infusions: . cefTRIAXone (ROCEPHIN) IVPB 1 gram/100 mL NS (Mini-Bag Plus) Stopped (08/06/17 2230)  . methocarbamol (ROBAXIN)  IV     PRN Meds: HYDROcodone-acetaminophen, methocarbamol **OR** methocarbamol (ROBAXIN)  IV, morphine injection  Allergies:    Allergies  Allergen Reactions  . Sulfamethoxazole Diarrhea  . Ampicillin Other (See Comments)    C-DIF AFTER TAKING AMPICILLIN    Has patient had a PCN reaction causing immediate rash, facial/tongue/throat swelling, SOB or lightheadedness with hypotension: No Has patient had a PCN reaction causing severe rash involving mucus membranes or skin necrosis: No Has patient had a PCN reaction that required hospitalization: No Has patient had a PCN reaction occurring within the last 10 years: No If all of the above answers are "NO", then may proceed with Cephalosporin use.  . Clindamycin/Lincomycin Other (See  Comments)    PT STATES HER DOCTOR TOLD HER NOT TO TAKE CLINDAMYCIN BECAUSE SHE GOT C-DIFF AFTER TAKING AMPICILLIN  . Latex Other (See Comments)    Blisters   . Pradaxa [Dabigatran Etexilate Mesylate] Other (See Comments)    INTERNAL BLEEDING  . Ciprofloxacin Rash    RASH PT STATES ANY OTHER DRUGS IN [facility] FAMILY SHE IS ALLERGIC TO    Social History:   Social History   Socioeconomic History  . Marital status: Married    Spouse name: Not on file  . Number of children: Not on file  . Years of education: Not on file  . Highest education level: Not on file  Occupational History  . Not on file  Social Needs  . Financial resource strain: Not on file  . Food insecurity:    Worry: Not on file    Inability: Not on file  . Transportation needs:    Medical: Not on file    Non-medical: Not on file  Tobacco Use  . Smoking status: Never Smoker  . Smokeless tobacco: Never Used  Substance and Sexual Activity  . Alcohol use: No  . Drug use: No  . Sexual activity: Not on file  Lifestyle  . Physical activity:    Days per week: Not on file    Minutes per session: Not on file  . Stress: Not on file  Relationships  . Social connections:    Talks on phone: Not on file    Gets together: Not on file    Attends religious service: Not on file    Active member of club or organization: Not on file    Attends meetings of clubs or organizations: Not on file    Relationship status: Not on file  . Intimate partner violence:    Fear of current or ex partner: Not on file    Emotionally abused: Not on file    Physically abused: Not on file    Forced sexual activity: Not on file  Other Topics Concern  . Not on file  Social History Narrative  . Not on file     Family History:    Family History  Problem Relation Age of Onset  . CAD Mother   . Stroke Mother   . CAD Father       Review of Systems    General:  No chills, fever, night sweats or weight changes. Positive for left leg  pain.  Cardiovascular:  No chest pain, dyspnea on exertion, edema, orthopnea, palpitations, paroxysmal nocturnal dyspnea. Dermatological: No rash, lesions/masses Respiratory: No cough, dyspnea Urologic: No hematuria, dysuria Abdominal:   No nausea, vomiting, diarrhea, bright red blood per rectum, melena, or hematemesis Neurologic:  No visual changes, or changes in mental status. Positive for weakness.   All other systems reviewed and are otherwise negative except as noted above.  Physical Exam/Data    Vitals:   08/07/17 1245 08/07/17 1300 08/07/17 1343 08/07/17 1358  BP: (!) 164/65 (!) 154/65 (!) 121/59 (!) 136/54  Pulse: 71 69 70 72  Resp: 14 (!) 22 18 18   Temp:   98.4 F (36.9 C) 98.5 F (36.9 C)  TempSrc:   Oral Oral  SpO2: 98% 97%  99%  Weight:      Height:        Intake/Output Summary (Last 24 hours) at 08/07/2017 1422 Last data filed at 08/07/2017 1343 Gross per 24 hour  Intake 815 ml  Output -  Net 815 ml   Filed Weights   08/06/17 1622  Weight: 161 lb (73 kg)   Body mass index is 27.21 kg/m.   General: Pleasant, elderly Caucasian female appearing in  NAD Psych: Normal affect. Neuro: Alert and oriented X 3. Moves all extremities spontaneously. HEENT: Normal  Neck: Supple without bruits or JVD. Lungs:  Resp regular and unlabored, CTA without wheezing or rales. Heart: RRR no s3, s4, or murmurs. Abdomen: Soft, non-tender, non-distended, BS + x 4.  Extremities: No clubbing, cyanosis or lower extremity edema. DP/PT/Radials 2+ and equal bilaterally. Left leg supported.    EKG:  The EKG was personally reviewed and demonstrates:  NSR, HR 70, and baseline artifact with no acute ST changes when compared to prior tracings.  Telemetry:  Telemetry was personally reviewed and demonstrates: NSR, HR in 60's to 80's. Occasional PVC's.    Labs/Studies     Relevant CV Studies:  Echocardiogram: Pending  Laboratory Data:  Chemistry Recent Labs  Lab 08/06/17 1630  08/07/17 0845  NA 139 136  K 3.5 3.9  CL 104 105  CO2 25 23  GLUCOSE 175* 124*  BUN 39* 31*  CREATININE 1.55* 1.27*  CALCIUM 9.1 8.2*  GFRNONAA 30* 38*  GFRAA 35* 44*  ANIONGAP 10 8    Recent Labs  Lab 08/06/17 1630 08/07/17 0845  PROT 6.6  --   ALBUMIN 3.7 3.1*  AST 20  --   ALT 17  --   ALKPHOS 72  --   BILITOT 0.4  --    Hematology Recent Labs  Lab 08/06/17 1630 08/07/17 0006 08/07/17 0845  WBC 10.9*  --  9.3  RBC 3.44* 2.87* 2.60*  HGB 9.4*  --  7.1*  HCT 29.6*  --  22.2*  MCV 86.0  --  85.4  MCH 27.3  --  27.3  MCHC 31.8  --  32.0  RDW 14.0  --  14.0  PLT 270  --  236   Cardiac Enzymes Recent Labs  Lab 08/07/17 0006 08/07/17 0845  TROPONINI <0.03 0.03*   No results for input(s): TROPIPOC in the last 168 hours.  BNPNo results for input(s): BNP, PROBNP in the last 168 hours.  DDimer No results for input(s): DDIMER in the last 168 hours.  Radiology/Studies:  Dg Chest 1 View  Result Date: 08/06/2017 CLINICAL DATA:  84 year old female status post fall, in severe pain. Acute left femur fracture. EXAM: CHEST  1 VIEW COMPARISON:  Chest CTA  and radiographs 11/04/2015 and earlier. FINDINGS: Portable AP supine view at 1712 hours. Stable cardiomegaly and mediastinal contours. Stable tortuous thoracic aorta. Visualized tracheal air column is within normal limits. Allowing for portable technique the lungs are clear. Osteopenia. No acute osseous abnormality identified. IMPRESSION: No acute cardiopulmonary abnormality. Electronically Signed   By: Genevie Ann M.D.   On: 08/06/2017 17:37   Dg Pelvis 1-2 Views  Result Date: 08/06/2017 CLINICAL DATA:  84 year old female status post fall, in severe pain. Acute left femur fracture. EXAM: PELVIS - 1-2 VIEW COMPARISON:  Left femur series today reported separately. CT Abdomen and Pelvis 08/11/2012. FINDINGS: Supine view at 1713 hours. The femoral heads remain normally located. The proximal femurs appear intact. The pelvis appears  stable and intact. Sacral ala and SI joints appear stable and intact. Stable chronic lower lumbar degenerative facet hypertrophy. Negative visible bowel gas pattern. IMPRESSION: No acute fracture or dislocation identified about the pelvis. Electronically Signed   By: Genevie Ann M.D.   On: 08/06/2017 17:37   Ct Head Wo Contrast  Result Date: 08/06/2017 CLINICAL DATA:  [place] today onto LEFT side, head trauma, history atrial fibrillation, chronic anticoagulation therapy, high clinical risk, history breast cancer, asthma, hypertension, stroke EXAM: CT HEAD WITHOUT CONTRAST CT CERVICAL SPINE WITHOUT CONTRAST TECHNIQUE: Multidetector CT imaging of the head and cervical spine was performed following the standard protocol without intravenous contrast. Multiplanar CT image reconstructions of the cervical spine were also generated. COMPARISON:  01/28/2017 CT head, CT cervical spine 08/18/2010 FINDINGS: CT HEAD FINDINGS Brain: Generalized atrophy. Normal ventricular morphology. No midline shift or mass effect. Small vessel chronic ischemic changes of deep cerebral white matter. Old RIGHT posterior parietal and LEFT cerebellar infarcts. Old lacunar infarcts BILATERAL basal ganglia. No intracranial hemorrhage, mass lesion, evidence of acute infarction, or extra-axial fluid collection. Vascular: Atherosclerotic calcification of internal carotid and vertebral arteries bilaterally at skull base Skull: Skull intact Sinuses/Orbits: Small amount of fluid/mucous in the RIGHT sphenoid sinus. Opacified LEFT ethmoid air cell. Visualized paranasal sinuses and LEFT mastoid air cells otherwise clear. Prior RIGHT mastoidectomy. Nasal septal deviation to the RIGHT. Other: N/A CT CERVICAL SPINE FINDINGS Alignment: Normal, stable Skull base and vertebrae: Diffuse osseous demineralization. Scattered facet degenerative changes. Ankylosis of the LEFT C4-C5 facet joint. Visualized skull base intact. No acute fracture or bone destruction. Soft  tissues and spinal canal: Prevertebral soft tissues normal thickness. Atherosclerotic calcifications at the LEFT carotid bifurcation and proximal great vessels. Disc levels:  No additional abnormalities Upper chest: Lung apices clear Other: N/A IMPRESSION: Atrophy with small vessel chronic ischemic changes of deep cerebral white matter. Old RIGHT posterior parietal and LEFT cerebellar infarcts as well as BILATERAL basal ganglia lacunar infarcts. No acute intracranial abnormalities. Degenerative facet disease changes of the cervical spine. No acute cervical spine abnormalities. Electronically Signed   By: Lavonia Dana M.D.   On: 08/06/2017 18:04   Ct Cervical Spine Wo Contrast  Result Date: 08/06/2017 CLINICAL DATA:  [place] today onto LEFT side, head trauma, history atrial fibrillation, chronic anticoagulation therapy, high clinical risk, history breast cancer, asthma, hypertension, stroke EXAM: CT HEAD WITHOUT CONTRAST CT CERVICAL SPINE WITHOUT CONTRAST TECHNIQUE: Multidetector CT imaging of the head and cervical spine was performed following the standard protocol without intravenous contrast. Multiplanar CT image reconstructions of the cervical spine were also generated. COMPARISON:  01/28/2017 CT head, CT cervical spine 08/18/2010 FINDINGS: CT HEAD FINDINGS Brain: Generalized atrophy. Normal ventricular morphology. No midline shift or mass effect. Small vessel chronic ischemic changes  of deep cerebral white matter. Old RIGHT posterior parietal and LEFT cerebellar infarcts. Old lacunar infarcts BILATERAL basal ganglia. No intracranial hemorrhage, mass lesion, evidence of acute infarction, or extra-axial fluid collection. Vascular: Atherosclerotic calcification of internal carotid and vertebral arteries bilaterally at skull base Skull: Skull intact Sinuses/Orbits: Small amount of fluid/mucous in the RIGHT sphenoid sinus. Opacified LEFT ethmoid air cell. Visualized paranasal sinuses and LEFT mastoid air cells  otherwise clear. Prior RIGHT mastoidectomy. Nasal septal deviation to the RIGHT. Other: N/A CT CERVICAL SPINE FINDINGS Alignment: Normal, stable Skull base and vertebrae: Diffuse osseous demineralization. Scattered facet degenerative changes. Ankylosis of the LEFT C4-C5 facet joint. Visualized skull base intact. No acute fracture or bone destruction. Soft tissues and spinal canal: Prevertebral soft tissues normal thickness. Atherosclerotic calcifications at the LEFT carotid bifurcation and proximal great vessels. Disc levels:  No additional abnormalities Upper chest: Lung apices clear Other: N/A IMPRESSION: Atrophy with small vessel chronic ischemic changes of deep cerebral white matter. Old RIGHT posterior parietal and LEFT cerebellar infarcts as well as BILATERAL basal ganglia lacunar infarcts. No acute intracranial abnormalities. Degenerative facet disease changes of the cervical spine. No acute cervical spine abnormalities. Electronically Signed   By: Lavonia Dana M.D.   On: 08/06/2017 18:04   Ct Knee Left Wo Contrast  Result Date: 08/06/2017 CLINICAL DATA:  Recent fall with known distal femoral fracture EXAM: CT OF THE LEFT KNEE WITHOUT CONTRAST TECHNIQUE: Multidetector CT imaging of the left knee was performed according to the standard protocol. Multiplanar CT image reconstructions were also generated. COMPARISON:  Plain films from earlier in the same day. FINDINGS: Bones/Joint/Cartilage There is a comminuted fracture of the distal femur extending from the diaphysis into the metaphysis. The prosthesis is not significantly displaced although the fracture lines do extend to the level of the distal femur adjacent to the prosthesis. Mild posterior displacement of the distal fracture fragments of approximately 1.7 cm is noted. Lateral displacement of the fracture fragments is seen of approximately 2.4 cm. This is a rough estimate due to the complex nature of the fracture. Additionally mild impaction of the  distal fracture fragments is seen. The tibial component of the prosthesis is within normal limits as is the proximal tibia and fibula. Ligaments Suboptimally assessed by CT. Muscles and Tendons The surrounding musculature appears within normal limits with the exception of some mild edema related to the recent injury. Soft tissues Mild adjacent soft tissue swelling is noted in the region of the femoral fracture. No definitive arterial or venous abnormality is noted although the lack of contrast limits evaluation. Diffuse vascular calcifications are seen. IMPRESSION: Comminuted distal left femoral fracture extending to the level of the femoral portion of the knee prosthesis. The prosthesis does not appear to be significantly displaced. The distal fracture fragments are displaced posteriorly and laterally as described with some degree of impaction at the fracture site as well. Electronically Signed   By: Inez Catalina M.D.   On: 08/06/2017 20:14   Dg Femur Min 2 Views Left  Result Date: 08/06/2017 CLINICAL DATA:  Status post placement of a knee immobilizer EXAM: LEFT FEMUR 2 VIEWS COMPARISON:  Films from earlier in the same day. FINDINGS: The proximal left femur is within normal limits. The previously seen comminuted distal femoral fracture is again seen. The degree of angulation and displacement has reduced somewhat. No other focal abnormality is noted. IMPRESSION: Slight reduction in distal femoral fracture. Electronically Signed   By: Inez Catalina M.D.   On: 08/06/2017 19:13  Dg Femur Min 2 Views Left  Result Date: 08/06/2017 CLINICAL DATA:  84 year old female status post fall, in severe pain. Externally rotated left lower leg. EXAM: LEFT FEMUR 2 VIEWS COMPARISON:  Left tib-fib series 06/10/2009. FINDINGS: The left femoral head remains normally located in the proximal left femur appears intact. The left hemipelvis appears intact. Osteopenia. Spiral comminuted, displaced, and impacted fracture of the distal  left femur metadiaphysis. The fracture extends to involve left total knee femoral hardware. There is a large 9 centimeter butterfly fragment projecting obliquely over the distal femoral shaft. There is greater than 1 full shaft width posterior displacement and superimposed posterior angulation. There is also rotation of the distal fragment. IMPRESSION: 1. Severely comminuted fracture of the distal left femur metadiaphysis extending to involve preexisting left total knee hardware. Greater than 1 full shaft width posterior displacement with superimposed angulation and rotation of the distal fragment. 2. The mid and proximal left femur remain intact. Electronically Signed   By: Genevie Ann M.D.   On: 08/06/2017 17:36     Assessment & Plan    1. Preoperative Cardiac Clearance for Surgical Repair of Left Femur Fracture - presented for evaluation after falling and landing on her left hip, with imaging showing a severely comminuted fracture of the distal left femur metadiaphysis extending to involve preexisting left total knee hardware with greater than 1 full shaft width posterior displacement with superimposed angulation and rotation of the distal fragment.  - she ambulates with a cane when away from home but walks on a treadmill daily for up to 15 minutes at 1.4 - 1.5 mph. Denies any anginal symptoms with this. An echocardiogram has been ordered by the admitting team and is pending. Would not anticipate further cardiac evaluation past this. If EF is found to be reduced, would need to compare with prior echocardiograms which have been obtained by Dr. Wynonia Lawman per the patient's report.   2. Paroxysmal Atrial Fibrillation - maintaining NSR by review of telemetry this admission.  - Coumadin held in the setting of upcoming surgery and anemia (Hgb currently 7.1 and receiving transfusion).  - continue Lopressor for arte-control.   3. HTN - BP variable at 120/50 - 172/109 since admission. At 157/70 on most recent  check.  - continue Lopressor and restart Losartan if needed (creatinine improved to 1.27, close to baseline).   4. Anemia - chronic anemia with baseline Hgb ~ 9.0. Down to 7.1 this AM. - currently receiving transfusion.   For questions or updates, please contact [place] Please consult [email or web address] for contact info under Cardiology/STEMI.  Signed, Erma Heritage, PA-C 08/07/2017, 2:22 PM Pager: [phone]  The patient was seen, examined and discussed with Bernerd Pho, PA-C and I agree with the above.    84 y.o. female with past medical history of PAF (on Coumadin), HTN, HLD, GERD, and prior CVA who presented with a hip fracture and cardiology was consulted for preoperative cardiac evaluation.  This happened after a mechanical fall, no chest pain, palpitations, no loss of consciousness. She has been quite active, walking with a walker, able to do do most house chores with no signs of angina or CHF. Physical exam shows no JVDs, lungs clear to auscultation, RRR, no significant murmurs, no LE edema and good peripheral pulses. She has known PAF, no episodes in the last several years and denies any history of CAD.  Troponin negative, ECG normal.  At this point there is no contraindication from cardiac standpoint for this patient to undergo  hip replacement. Hold coumadin, no bridging necessary unless she has another episode of a-fib. BP slightly elevated possibly sec to pain.   Ena Dawley, MD 08/07/2017

## 2017-08-07 NOTE — ED Notes (Signed)
Lab called with critical troponin 0.03.

## 2017-08-07 NOTE — Progress Notes (Signed)
PROGRESS NOTE    [name]  VOH:[id number] DOB: 1933/11/06 DOA: 08/06/2017 PCP: Leanna Battles, MD  Brief Narrative:  84 y.o. 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 She felt dizzy she had her cain with her. No head injury no LOC.  Denies black stool or blood in stool. Reports recent symptoms of UTI. Reports diarrhea recently x3 no vomiting but slight nausea Husband gave her immodium.  She has not slept well. She went to urology on Friday and gave a sample.  UA positive for UTI  last time she was treated with Manurol.  Last urine culture Grew Proteus resistant to tetracycline  At baseline able to walk up a stair case or walk a block with out chest pain.   Regarding pertinent Chronic problems: Atrial fibrillation on Coumadin. No hx of CAD   While in ER: Found to have left periprosthetic distal femur fracture similar to prior total knee replacement Placed in knee immobilizer seen by orthopedics    Assessment & Plan:   Principal Problem:   Periprosthetic fracture of shaft of femur Active Problems:   Diarrhea   Paroxysmal atrial fibrillation (HCC)   Hypertension   Anemia   Lightheadedness   Femur fracture, left (HCC)   Dehydration   Acute lower UTI  1] left comminuted periprosthetic femur fracture status post mechanical fall.  Seen by Ortho.  CT of the left  hip was ordered because of the complex nature of the fracture in the periprosthetic area.  CT showedComminuted distal left femoral fracture extending to the level of the femoral portion of the knee prosthesis. The prosthesis does not appear to be significantly displaced. The distal fracture fragments are displaced posteriorly and laterally as described with some degree of impaction at the fracture site as  well.  2] anemia patient has over a 2 g drop in her hemoglobin from 9-7.1.  Iron panels were done.  Will transfuse 1 unit of PRBC.  Concerned about bleeding into the joint or the fracture site.  Will also check FOBT.  3] paroxysmal atrial fibrillation-resume anticoagulation when okay with Ortho.  Patient takes Coumadin at home.  Her hemoglobin has dropped from 9.4-7.1.  Clear source of bleeding.  I would like to bridge her with heparin but because of this hemoglobin drop I will hold off on it and watch carefully.  Patient followed by Dr. Wynonia Lawman cardiologist.  Continue metoprolol follow-up echo.    4] stage III CKD stable  5] type 2 diabetes sliding scale insulin since surgery is done.  Restart home medications after surgery.  6] history of stroke   7] diarrhea C. difficile pending and probiotics.  8] hypertension Cozaar on hold restart after surgery as needed.  Continue metoprolol.  DVT prophylaxis: scd Code Status: full Family Communication: dw husband Disposition Plan: tbd Consultants:  orhto  Procedures: none Antimicrobials: None  Subjective: Complains of a lot of pain in the right hip especially when moving  Objective: Vitals:   08/07/17 1015 08/07/17 1030 08/07/17 1045 08/07/17 1100  BP: 125/64 (!) 128/51 (!) 167/61 (!) 149/59  Pulse: 70 69 66 69  Resp: 13 12 13 13   Temp:      TempSrc:      SpO2: 95% 93% 95% 94%  Weight:  Height:        Intake/Output Summary (Last 24 hours) at 08/07/2017 1244 Last data filed at 08/06/2017 1745 Gross per 24 hour  Intake 500 ml  Output -  Net 500 ml   Filed Weights   08/06/17 1622  Weight: 73 kg (161 lb)    Examination:  General exam: Appears calm and comfortable  Respiratory system: Clear to auscultation. Respiratory effort normal. Cardiovascular system: S1 & S2 heard, RRR. No JVD, murmurs, rubs, gallops or clicks. No pedal edema. Gastrointestinal system: Abdomen is nondistended, soft and nontender. No organomegaly or  masses felt. Normal bowel sounds heard. Central nervous system: Alert and oriented. No focal neurological deficits. Extremities: Right hip bruising and tenderness noted. Skin: No rashes, lesions or ulcers Psychiatry: Judgement and insight appear normal. Mood & affect appropriate.     Data Reviewed: I have personally reviewed following labs and imaging studies  CBC: Recent Labs  Lab 08/06/17 1630 08/07/17 0845  WBC 10.9* 9.3  HGB 9.4* 7.1*  HCT 29.6* 22.2*  MCV 86.0 85.4  PLT 270 956   Basic Metabolic Panel: Recent Labs  Lab 08/06/17 1630 08/07/17 0845  NA 139 136  K 3.5 3.9  CL 104 105  CO2 25 23  GLUCOSE 175* 124*  BUN 39* 31*  CREATININE 1.55* 1.27*  CALCIUM 9.1 8.2*   GFR: Estimated Creatinine Clearance: 33.8 mL/min (A) (by C-G formula based on SCr of 1.27 mg/dL (H)). Liver Function Tests: Recent Labs  Lab 08/06/17 1630 08/07/17 0845  AST 20  --   ALT 17  --   ALKPHOS 72  --   BILITOT 0.4  --   PROT 6.6  --   ALBUMIN 3.7 3.1*   No results for input(s): LIPASE, AMYLASE in the last 168 hours. No results for input(s): AMMONIA in the last 168 hours. Coagulation Profile: Recent Labs  Lab 08/06/17 1630  INR 1.66   Cardiac Enzymes: Recent Labs  Lab 08/07/17 0006 08/07/17 0845  TROPONINI <0.03 0.03*   BNP (last 3 results) No results for input(s): PROBNP in the last 8760 hours. HbA1C: No results for input(s): HGBA1C in the last 72 hours. CBG: No results for input(s): GLUCAP in the last 168 hours. Lipid Profile: No results for input(s): CHOL, HDL, LDLCALC, TRIG, CHOLHDL, LDLDIRECT in the last 72 hours. Thyroid Function Tests: No results for input(s): TSH, T4TOTAL, FREET4, T3FREE, THYROIDAB in the last 72 hours. Anemia Panel: Recent Labs    08/07/17 0006  VITAMINB12 620  FOLATE 38.4  FERRITIN 123  TIBC 238*  IRON 19*  RETICCTPCT 1.2   Sepsis Labs: No results for input(s): PROCALCITON, LATICACIDVEN in the last 168 hours.  No results found  for this or any previous visit (from the past 240 hour(s)).       Radiology Studies: Dg Chest 1 View  Result Date: 08/06/2017 CLINICAL DATA:  84 year old female status post fall, in severe pain. Acute left femur fracture. EXAM: CHEST  1 VIEW COMPARISON:  Chest CTA and radiographs 11/04/2015 and earlier. FINDINGS: Portable AP supine view at 1712 hours. Stable cardiomegaly and mediastinal contours. Stable tortuous thoracic aorta. Visualized tracheal air column is within normal limits. Allowing for portable technique the lungs are clear. Osteopenia. No acute osseous abnormality identified. IMPRESSION: No acute cardiopulmonary abnormality. Electronically Signed   By: Genevie Ann M.D.   On: 08/06/2017 17:37   Dg Pelvis 1-2 Views  Result Date: 08/06/2017 CLINICAL DATA:  84 year old female status post fall, in severe pain. Acute left femur  fracture. EXAM: PELVIS - 1-2 VIEW COMPARISON:  Left femur series today reported separately. CT Abdomen and Pelvis 08/11/2012. FINDINGS: Supine view at 1713 hours. The femoral heads remain normally located. The proximal femurs appear intact. The pelvis appears stable and intact. Sacral ala and SI joints appear stable and intact. Stable chronic lower lumbar degenerative facet hypertrophy. Negative visible bowel gas pattern. IMPRESSION: No acute fracture or dislocation identified about the pelvis. Electronically Signed   By: Genevie Ann M.D.   On: 08/06/2017 17:37   Ct Head Wo Contrast  Result Date: 08/06/2017 CLINICAL DATA:  [place] today onto LEFT side, head trauma, history atrial fibrillation, chronic anticoagulation therapy, high clinical risk, history breast cancer, asthma, hypertension, stroke EXAM: CT HEAD WITHOUT CONTRAST CT CERVICAL SPINE WITHOUT CONTRAST TECHNIQUE: Multidetector CT imaging of the head and cervical spine was performed following the standard protocol without intravenous contrast. Multiplanar CT image reconstructions of the cervical spine were also generated.  COMPARISON:  01/28/2017 CT head, CT cervical spine 08/18/2010 FINDINGS: CT HEAD FINDINGS Brain: Generalized atrophy. Normal ventricular morphology. No midline shift or mass effect. Small vessel chronic ischemic changes of deep cerebral white matter. Old RIGHT posterior parietal and LEFT cerebellar infarcts. Old lacunar infarcts BILATERAL basal ganglia. No intracranial hemorrhage, mass lesion, evidence of acute infarction, or extra-axial fluid collection. Vascular: Atherosclerotic calcification of internal carotid and vertebral arteries bilaterally at skull base Skull: Skull intact Sinuses/Orbits: Small amount of fluid/mucous in the RIGHT sphenoid sinus. Opacified LEFT ethmoid air cell. Visualized paranasal sinuses and LEFT mastoid air cells otherwise clear. Prior RIGHT mastoidectomy. Nasal septal deviation to the RIGHT. Other: N/A CT CERVICAL SPINE FINDINGS Alignment: Normal, stable Skull base and vertebrae: Diffuse osseous demineralization. Scattered facet degenerative changes. Ankylosis of the LEFT C4-C5 facet joint. Visualized skull base intact. No acute fracture or bone destruction. Soft tissues and spinal canal: Prevertebral soft tissues normal thickness. Atherosclerotic calcifications at the LEFT carotid bifurcation and proximal great vessels. Disc levels:  No additional abnormalities Upper chest: Lung apices clear Other: N/A IMPRESSION: Atrophy with small vessel chronic ischemic changes of deep cerebral white matter. Old RIGHT posterior parietal and LEFT cerebellar infarcts as well as BILATERAL basal ganglia lacunar infarcts. No acute intracranial abnormalities. Degenerative facet disease changes of the cervical spine. No acute cervical spine abnormalities. Electronically Signed   By: Lavonia Dana M.D.   On: 08/06/2017 18:04   Ct Cervical Spine Wo Contrast  Result Date: 08/06/2017 CLINICAL DATA:  [place] today onto LEFT side, head trauma, history atrial fibrillation, chronic anticoagulation therapy, high  clinical risk, history breast cancer, asthma, hypertension, stroke EXAM: CT HEAD WITHOUT CONTRAST CT CERVICAL SPINE WITHOUT CONTRAST TECHNIQUE: Multidetector CT imaging of the head and cervical spine was performed following the standard protocol without intravenous contrast. Multiplanar CT image reconstructions of the cervical spine were also generated. COMPARISON:  01/28/2017 CT head, CT cervical spine 08/18/2010 FINDINGS: CT HEAD FINDINGS Brain: Generalized atrophy. Normal ventricular morphology. No midline shift or mass effect. Small vessel chronic ischemic changes of deep cerebral white matter. Old RIGHT posterior parietal and LEFT cerebellar infarcts. Old lacunar infarcts BILATERAL basal ganglia. No intracranial hemorrhage, mass lesion, evidence of acute infarction, or extra-axial fluid collection. Vascular: Atherosclerotic calcification of internal carotid and vertebral arteries bilaterally at skull base Skull: Skull intact Sinuses/Orbits: Small amount of fluid/mucous in the RIGHT sphenoid sinus. Opacified LEFT ethmoid air cell. Visualized paranasal sinuses and LEFT mastoid air cells otherwise clear. Prior RIGHT mastoidectomy. Nasal septal deviation to the RIGHT. Other: N/A CT CERVICAL SPINE  FINDINGS Alignment: Normal, stable Skull base and vertebrae: Diffuse osseous demineralization. Scattered facet degenerative changes. Ankylosis of the LEFT C4-C5 facet joint. Visualized skull base intact. No acute fracture or bone destruction. Soft tissues and spinal canal: Prevertebral soft tissues normal thickness. Atherosclerotic calcifications at the LEFT carotid bifurcation and proximal great vessels. Disc levels:  No additional abnormalities Upper chest: Lung apices clear Other: N/A IMPRESSION: Atrophy with small vessel chronic ischemic changes of deep cerebral white matter. Old RIGHT posterior parietal and LEFT cerebellar infarcts as well as BILATERAL basal ganglia lacunar infarcts. No acute intracranial  abnormalities. Degenerative facet disease changes of the cervical spine. No acute cervical spine abnormalities. Electronically Signed   By: Lavonia Dana M.D.   On: 08/06/2017 18:04   Ct Knee Left Wo Contrast  Result Date: 08/06/2017 CLINICAL DATA:  Recent fall with known distal femoral fracture EXAM: CT OF THE LEFT KNEE WITHOUT CONTRAST TECHNIQUE: Multidetector CT imaging of the left knee was performed according to the standard protocol. Multiplanar CT image reconstructions were also generated. COMPARISON:  Plain films from earlier in the same day. FINDINGS: Bones/Joint/Cartilage There is a comminuted fracture of the distal femur extending from the diaphysis into the metaphysis. The prosthesis is not significantly displaced although the fracture lines do extend to the level of the distal femur adjacent to the prosthesis. Mild posterior displacement of the distal fracture fragments of approximately 1.7 cm is noted. Lateral displacement of the fracture fragments is seen of approximately 2.4 cm. This is a rough estimate due to the complex nature of the fracture. Additionally mild impaction of the distal fracture fragments is seen. The tibial component of the prosthesis is within normal limits as is the proximal tibia and fibula. Ligaments Suboptimally assessed by CT. Muscles and Tendons The surrounding musculature appears within normal limits with the exception of some mild edema related to the recent injury. Soft tissues Mild adjacent soft tissue swelling is noted in the region of the femoral fracture. No definitive arterial or venous abnormality is noted although the lack of contrast limits evaluation. Diffuse vascular calcifications are seen. IMPRESSION: Comminuted distal left femoral fracture extending to the level of the femoral portion of the knee prosthesis. The prosthesis does not appear to be significantly displaced. The distal fracture fragments are displaced posteriorly and laterally as described with  some degree of impaction at the fracture site as well. Electronically Signed   By: Inez Catalina M.D.   On: 08/06/2017 20:14   Dg Femur Min 2 Views Left  Result Date: 08/06/2017 CLINICAL DATA:  Status post placement of a knee immobilizer EXAM: LEFT FEMUR 2 VIEWS COMPARISON:  Films from earlier in the same day. FINDINGS: The proximal left femur is within normal limits. The previously seen comminuted distal femoral fracture is again seen. The degree of angulation and displacement has reduced somewhat. No other focal abnormality is noted. IMPRESSION: Slight reduction in distal femoral fracture. Electronically Signed   By: Inez Catalina M.D.   On: 08/06/2017 19:13   Dg Femur Min 2 Views Left  Result Date: 08/06/2017 CLINICAL DATA:  84 year old female status post fall, in severe pain. Externally rotated left lower leg. EXAM: LEFT FEMUR 2 VIEWS COMPARISON:  Left tib-fib series 06/10/2009. FINDINGS: The left femoral head remains normally located in the proximal left femur appears intact. The left hemipelvis appears intact. Osteopenia. Spiral comminuted, displaced, and impacted fracture of the distal left femur metadiaphysis. The fracture extends to involve left total knee femoral hardware. There is a large 9  centimeter butterfly fragment projecting obliquely over the distal femoral shaft. There is greater than 1 full shaft width posterior displacement and superimposed posterior angulation. There is also rotation of the distal fragment. IMPRESSION: 1. Severely comminuted fracture of the distal left femur metadiaphysis extending to involve preexisting left total knee hardware. Greater than 1 full shaft width posterior displacement with superimposed angulation and rotation of the distal fragment. 2. The mid and proximal left femur remain intact. Electronically Signed   By: Genevie Ann M.D.   On: 08/06/2017 17:36        Scheduled Meds: . atorvastatin  20 mg Oral QPC supper  . famotidine  10 mg Oral Daily  .  metoprolol succinate  25 mg Oral BID  . pantoprazole  40 mg Oral Daily   Continuous Infusions: . sodium chloride    . cefTRIAXone (ROCEPHIN) IVPB 1 gram/100 mL NS (Mini-Bag Plus) Stopped (08/06/17 2230)  . methocarbamol (ROBAXIN)  IV       LOS: 1 day     Georgette Shell, MD [facility]  If 7PM-7AM, please contact night-coverage [email or web address] Password [facility] 08/07/2017, 12:44 PM

## 2017-08-07 NOTE — Progress Notes (Signed)
     Subjective:Very  Painful left knee     Patient reports pain as severe.    Objective:   VITALS:  Temp:  [98.2 F (36.8 C)-98.6 F (37 C)] 98.6 F (37 C) (05/12 1445) Pulse Rate:  [59-91] 68 (05/12 1445) Resp:  [11-22] 16 (05/12 1445) BP: (120-172)/(50-109) 157/70 (05/12 1445) SpO2:  [93 %-100 %] 95 % (05/12 1445) Weight:  [161 lb (73 kg)] 161 lb (73 kg) (05/11 1622)  Neurologically intact ABD soft Neurovascular intact Intact pulses distally Compartment soft Pain even with attempt at checking her pulse. Left foot is warm and pulse intact.    LABS Recent Labs    08/06/17 1630 08/07/17 0845  HGB 9.4* 7.1*  WBC 10.9* 9.3  PLT 270 236   Recent Labs    08/06/17 1630 08/07/17 0845  NA 139 136  K 3.5 3.9  CL 104 105  CO2 25 23  BUN 39* 31*  CREATININE 1.55* 1.27*  GLUCOSE 175* 124*   Recent Labs    08/06/17 1630  INR 1.66     Assessment/Plan: Left supracondylar femur fracture above left TKR. Anemia of acute blood loss. Transfuse and give IVF to rescucitate prior to any surgical intervention.      Continue with neurovascular checks left leg. Transfuse at least 2 units of blood.  Basil Dess 08/07/2017, 3:32 PMPatient ID: [name], female   DOB: 1933-04-20, 84 y.o.   MRN: [id number]

## 2017-08-07 NOTE — ED Notes (Signed)
Pt called out complaining of being in pain. CN Callie informed due to RN being busy with t

## 2017-08-07 NOTE — ED Notes (Signed)
RN spoke to floor coverage about patients medications. Orders received for a one time dose.

## 2017-08-07 NOTE — Progress Notes (Signed)
  Echocardiogram 2D Echocardiogram has been performed.  [name] 08/07/2017, 4:31 PM

## 2017-08-07 NOTE — ED Notes (Signed)
Heart Healthy breakfast tray ordered to go to [facility]

## 2017-08-07 NOTE — Progress Notes (Signed)
Discussed care with Dr. Ninfa Linden. Will tentatively plan for OR tomorrow for ORIF of left periprosthetic femur fracture pending Hgb, cardiac status as well as OR availability. Will make the patient NPO past midnight. Formal consult to following in AM. If possible please hold anticoagulation until after surgical fixation.  Shona Needles, MD [facility] [phone] (phone)

## 2017-08-07 NOTE — ED Notes (Signed)
Admitting provider paged regarding critical value per RN Gregs request

## 2017-08-07 NOTE — ED Notes (Signed)
Spoke with admit doctor notified lab troponin  0.03. No orders at this time.

## 2017-08-07 NOTE — ED Notes (Signed)
Spoke with doctor and patient. Patient has a cardiologist per patient Dr Wynonia Lawman.

## 2017-08-07 NOTE — Progress Notes (Signed)
Orthopedic Tech Progress Note Patient Details:  [name] 12-17-1933 [id number]  Patient ID: [name], female   DOB: 1934-02-06, 84 y.o.   MRN: [id number] Pt cant have ohf due to age restrictions.  [name] 08/07/2017, 10:31 PM

## 2017-08-08 ENCOUNTER — Inpatient Hospital Stay (HOSPITAL_COMMUNITY): Payer: Medicare Other | Admitting: Certified Registered"

## 2017-08-08 ENCOUNTER — Inpatient Hospital Stay (HOSPITAL_COMMUNITY): Payer: Medicare Other

## 2017-08-08 ENCOUNTER — Other Ambulatory Visit: Payer: Self-pay

## 2017-08-08 ENCOUNTER — Encounter (HOSPITAL_COMMUNITY): Payer: Self-pay | Admitting: General Practice

## 2017-08-08 ENCOUNTER — Encounter (HOSPITAL_COMMUNITY)
Admission: EM | Disposition: A | Discharge status: Skilled Nursing Facility | Payer: Self-pay | Source: Home / Self Care | Attending: Internal Medicine

## 2017-08-08 HISTORY — PX: ORIF FEMUR FRACTURE: SHX2119

## 2017-08-08 LAB — POCT I-STAT 7, (LYTES, BLD GAS, ICA,H+H)
Acid-base deficit: 2 mmol/L (ref 0.0–2.0)
Acid-base deficit: 3 mmol/L — ABNORMAL HIGH (ref 0.0–2.0)
Bicarbonate: 22.3 mmol/L (ref 20.0–28.0)
Bicarbonate: 23 mmol/L (ref 20.0–28.0)
Calcium, Ion: 1.17 mmol/L (ref 1.15–1.40)
Calcium, Ion: 1.17 mmol/L (ref 1.15–1.40)
HCT: 28 % — ABNORMAL LOW (ref 36.0–46.0)
HCT: 30 % — ABNORMAL LOW (ref 36.0–46.0)
Hemoglobin: 10.2 g/dL — ABNORMAL LOW (ref 12.0–15.0)
Hemoglobin: 9.5 g/dL — ABNORMAL LOW (ref 12.0–15.0)
O2 Saturation: 27 %
O2 Saturation: 97 %
Potassium: 3.6 mmol/L (ref 3.5–5.1)
Potassium: 3.6 mmol/L (ref 3.5–5.1)
Sodium: 137 mmol/L (ref 135–145)
Sodium: 137 mmol/L (ref 135–145)
TCO2: 23 mmol/L (ref 22–32)
TCO2: 24 mmol/L (ref 22–32)
pCO2 arterial: 35.4 mmHg (ref 32.0–48.0)
pCO2 arterial: 44.6 mmHg (ref 32.0–48.0)
pH, Arterial: 7.32 — ABNORMAL LOW (ref 7.350–7.450)
pH, Arterial: 7.408 (ref 7.350–7.450)
pO2, Arterial: 20 mmHg — CL (ref 83.0–108.0)
pO2, Arterial: 92 mmHg (ref 83.0–108.0)

## 2017-08-08 LAB — ECHOCARDIOGRAM COMPLETE
Height: 64.5 in
Weight: 2576 oz

## 2017-08-08 LAB — CBC WITH DIFFERENTIAL/PLATELET
Basophils Absolute: 0 10*3/uL (ref 0.0–0.1)
Basophils Relative: 0 %
Eosinophils Absolute: 0 10*3/uL (ref 0.0–0.7)
Eosinophils Relative: 0 %
HCT: 29.9 % — ABNORMAL LOW (ref 36.0–46.0)
Hemoglobin: 9.8 g/dL — ABNORMAL LOW (ref 12.0–15.0)
Lymphocytes Relative: 12 %
Lymphs Abs: 1.1 10*3/uL (ref 0.7–4.0)
MCH: 27.5 pg (ref 26.0–34.0)
MCHC: 32.8 g/dL (ref 30.0–36.0)
MCV: 83.8 fL (ref 78.0–100.0)
Monocytes Absolute: 0.6 10*3/uL (ref 0.1–1.0)
Monocytes Relative: 6 %
Neutro Abs: 7.8 10*3/uL — ABNORMAL HIGH (ref 1.7–7.7)
Neutrophils Relative %: 82 %
Platelets: 187 10*3/uL (ref 150–400)
RBC: 3.57 MIL/uL — ABNORMAL LOW (ref 3.87–5.11)
RDW: 13.9 % (ref 11.5–15.5)
WBC: 9.5 10*3/uL (ref 4.0–10.5)

## 2017-08-08 LAB — URINE CULTURE

## 2017-08-08 LAB — TROPONIN I
Troponin I: 0.03 ng/mL (ref <0.03)
Troponin I: 0.03 ng/mL (ref <0.03)

## 2017-08-08 LAB — BASIC METABOLIC PANEL
Anion gap: 10 (ref 5–15)
BUN: 22 mg/dL — ABNORMAL HIGH (ref 6–20)
CO2: 22 mmol/L (ref 22–32)
Calcium: 8.5 mg/dL — ABNORMAL LOW (ref 8.9–10.3)
Chloride: 104 mmol/L (ref 101–111)
Creatinine, Ser: 1.12 mg/dL — ABNORMAL HIGH (ref 0.44–1.00)
GFR calc Af Amer: 52 mL/min — ABNORMAL LOW (ref >60)
GFR calc non Af Amer: 44 mL/min — ABNORMAL LOW (ref >60)
Glucose, Bld: 135 mg/dL — ABNORMAL HIGH (ref 65–99)
Potassium: 3.8 mmol/L (ref 3.5–5.1)
Sodium: 136 mmol/L (ref 135–145)

## 2017-08-08 LAB — SURGICAL PCR SCREEN
MRSA, PCR: NEGATIVE
Staphylococcus aureus: NEGATIVE

## 2017-08-08 LAB — VITAMIN D 25 HYDROXY (VIT D DEFICIENCY, FRACTURES): Vit D, 25-Hydroxy: 47.5 ng/mL (ref 30.0–100.0)

## 2017-08-08 SURGERY — OPEN REDUCTION INTERNAL FIXATION (ORIF) DISTAL FEMUR FRACTURE
Anesthesia: General | Laterality: Left

## 2017-08-08 MED ORDER — SUGAMMADEX SODIUM 200 MG/2ML IV SOLN
INTRAVENOUS | Status: DC | PRN
  Administered 2017-08-08: 150 mg via INTRAVENOUS

## 2017-08-08 MED ORDER — BACITRACIN ZINC 500 UNIT/GM EX OINT
TOPICAL_OINTMENT | CUTANEOUS | Status: AC
  Filled 2017-08-08: qty 28.35

## 2017-08-08 MED ORDER — FENTANYL CITRATE (PF) 100 MCG/2ML IJ SOLN
INTRAMUSCULAR | Status: DC | PRN
  Administered 2017-08-08 (×2): 25 ug via INTRAVENOUS
  Administered 2017-08-08: 100 ug via INTRAVENOUS
  Administered 2017-08-08: 50 ug via INTRAVENOUS
  Administered 2017-08-08 (×2): 25 ug via INTRAVENOUS

## 2017-08-08 MED ORDER — LACTATED RINGERS IV SOLN
INTRAVENOUS | Status: DC
  Administered 2017-08-08: 12:00:00 via INTRAVENOUS

## 2017-08-08 MED ORDER — EPHEDRINE SULFATE 50 MG/ML IJ SOLN
INTRAMUSCULAR | Status: AC
  Filled 2017-08-08: qty 1

## 2017-08-08 MED ORDER — CEFAZOLIN SODIUM-DEXTROSE 2-4 GM/100ML-% IV SOLN
2.0000 g | Freq: Three times a day (TID) | INTRAVENOUS | Status: AC
  Administered 2017-08-08 – 2017-08-09 (×3): 2 g via INTRAVENOUS
  Filled 2017-08-08 (×3): qty 100

## 2017-08-08 MED ORDER — ROCURONIUM BROMIDE 100 MG/10ML IV SOLN
INTRAVENOUS | Status: DC | PRN
  Administered 2017-08-08: 40 mg via INTRAVENOUS
  Administered 2017-08-08: 10 mg via INTRAVENOUS

## 2017-08-08 MED ORDER — CHLORHEXIDINE GLUCONATE 4 % EX LIQD: 60.0000 mL | Freq: Once | CUTANEOUS | Status: DC

## 2017-08-08 MED ORDER — CEFAZOLIN SODIUM-DEXTROSE 2-4 GM/100ML-% IV SOLN
2.0000 g | INTRAVENOUS | Status: AC
  Administered 2017-08-08: 2 g via INTRAVENOUS
  Filled 2017-08-08 (×2): qty 100

## 2017-08-08 MED ORDER — ONDANSETRON HCL 4 MG/2ML IJ SOLN
INTRAMUSCULAR | Status: AC
  Filled 2017-08-08: qty 2

## 2017-08-08 MED ORDER — POVIDONE-IODINE 10 % EX SWAB: 2.0000 "application " | Freq: Once | CUTANEOUS | Status: DC

## 2017-08-08 MED ORDER — NEOSTIGMINE METHYLSULFATE 5 MG/5ML IV SOSY
PREFILLED_SYRINGE | INTRAVENOUS | Status: AC
Start: 2017-08-08 — End: ?
  Filled 2017-08-08: qty 5

## 2017-08-08 MED ORDER — FENTANYL CITRATE (PF) 100 MCG/2ML IJ SOLN
25.0000 ug | INTRAMUSCULAR | Status: DC | PRN
  Administered 2017-08-08: 25 ug via INTRAVENOUS
  Administered 2017-08-08: 50 ug via INTRAVENOUS

## 2017-08-08 MED ORDER — BACITRACIN 500 UNIT/GM EX OINT
TOPICAL_OINTMENT | CUTANEOUS | Status: DC | PRN
  Administered 2017-08-08: 1 via TOPICAL

## 2017-08-08 MED ORDER — VANCOMYCIN HCL 1000 MG IV SOLR
INTRAVENOUS | Status: DC | PRN
  Administered 2017-08-08: 1000 mg via TOPICAL

## 2017-08-08 MED ORDER — LIDOCAINE HCL (CARDIAC) PF 100 MG/5ML IV SOSY
PREFILLED_SYRINGE | INTRAVENOUS | Status: DC | PRN
  Administered 2017-08-08: 60 mg via INTRAVENOUS

## 2017-08-08 MED ORDER — MEPERIDINE HCL 50 MG/ML IJ SOLN: 6.2500 mg | INTRAMUSCULAR | Status: DC | PRN

## 2017-08-08 MED ORDER — VANCOMYCIN HCL 1000 MG IV SOLR
INTRAVENOUS | Status: AC
  Filled 2017-08-08: qty 1000

## 2017-08-08 MED ORDER — ONDANSETRON HCL 4 MG/2ML IJ SOLN
INTRAMUSCULAR | Status: AC
  Filled 2017-08-08: qty 4

## 2017-08-08 MED ORDER — FENTANYL CITRATE (PF) 100 MCG/2ML IJ SOLN
INTRAMUSCULAR | Status: AC
  Administered 2017-08-08: 50 ug via INTRAVENOUS
  Filled 2017-08-08: qty 2

## 2017-08-08 MED ORDER — ONDANSETRON HCL 4 MG/2ML IJ SOLN
INTRAMUSCULAR | Status: DC | PRN
  Administered 2017-08-08: 4 mg via INTRAVENOUS

## 2017-08-08 MED ORDER — ROCURONIUM BROMIDE 50 MG/5ML IV SOLN
INTRAVENOUS | Status: AC
  Filled 2017-08-08: qty 1

## 2017-08-08 MED ORDER — LABETALOL HCL 5 MG/ML IV SOLN
10.0000 mg | INTRAVENOUS | Status: DC | PRN
  Administered 2017-08-08: 10 mg via INTRAVENOUS

## 2017-08-08 MED ORDER — LIDOCAINE 2% (20 MG/ML) 5 ML SYRINGE
INTRAMUSCULAR | Status: AC
  Filled 2017-08-08: qty 5

## 2017-08-08 MED ORDER — 0.9 % SODIUM CHLORIDE (POUR BTL) OPTIME
TOPICAL | Status: DC | PRN
  Administered 2017-08-08: 1000 mL

## 2017-08-08 MED ORDER — PHENYLEPHRINE HCL 10 MG/ML IJ SOLN
INTRAVENOUS | Status: DC | PRN
  Administered 2017-08-08: 20 ug/min via INTRAVENOUS

## 2017-08-08 MED ORDER — METOCLOPRAMIDE HCL 5 MG/ML IJ SOLN: 10.0000 mg | Freq: Once | INTRAMUSCULAR | Status: DC | PRN

## 2017-08-08 MED ORDER — SUGAMMADEX SODIUM 200 MG/2ML IV SOLN
INTRAVENOUS | Status: AC
  Filled 2017-08-08: qty 2

## 2017-08-08 MED ORDER — DEXAMETHASONE SODIUM PHOSPHATE 10 MG/ML IJ SOLN
INTRAMUSCULAR | Status: AC
  Filled 2017-08-08: qty 1

## 2017-08-08 MED ORDER — PHENYLEPHRINE 40 MCG/ML (10ML) SYRINGE FOR IV PUSH (FOR BLOOD PRESSURE SUPPORT)
PREFILLED_SYRINGE | INTRAVENOUS | Status: AC
  Filled 2017-08-08: qty 20

## 2017-08-08 MED ORDER — ARTIFICIAL TEARS OPHTHALMIC OINT
TOPICAL_OINTMENT | OPHTHALMIC | Status: AC
  Filled 2017-08-08: qty 3.5

## 2017-08-08 MED ORDER — FENTANYL CITRATE (PF) 250 MCG/5ML IJ SOLN
INTRAMUSCULAR | Status: AC
  Filled 2017-08-08: qty 5

## 2017-08-08 MED ORDER — PROPOFOL 10 MG/ML IV BOLUS
INTRAVENOUS | Status: DC | PRN
  Administered 2017-08-08: 20 mg via INTRAVENOUS
  Administered 2017-08-08: 100 mg via INTRAVENOUS

## 2017-08-08 MED ORDER — LABETALOL HCL 5 MG/ML IV SOLN
INTRAVENOUS | Status: AC
Start: 2017-08-08 — End: 2017-08-09
  Filled 2017-08-08: qty 4

## 2017-08-08 SURGICAL SUPPLY — 69 items
BANDAGE ACE 6X5 VEL STRL LF (GAUZE/BANDAGES/DRESSINGS) ×1 IMPLANT
BIT DRILL 3.3MM (BIT) IMPLANT
BIT DRILL 4.3 (BIT) IMPLANT
BIT DRILL NCB FEM QC 4.3X245 (BIT) IMPLANT
BLADE CLIPPER SURG (BLADE) IMPLANT
BNDG CMPR MED 10X6 ELC LF (GAUZE/BANDAGES/DRESSINGS) ×1
BNDG COHESIVE 6X5 TAN STRL LF (GAUZE/BANDAGES/DRESSINGS) ×1 IMPLANT
BNDG ELASTIC 6X10 VLCR STRL LF (GAUZE/BANDAGES/DRESSINGS) ×2 IMPLANT
BRUSH SCRUB SURG 4.25 DISP (MISCELLANEOUS) ×4 IMPLANT
CANISTER SUCT 3000ML PPV (MISCELLANEOUS) ×2 IMPLANT
CAP LOCK NCB (Cap) ×7 IMPLANT
CHLORAPREP W/TINT 26ML (MISCELLANEOUS) ×2 IMPLANT
COVER SURGICAL LIGHT HANDLE (MISCELLANEOUS) ×2 IMPLANT
DRAPE C-ARM 42X72 X-RAY (DRAPES) ×2 IMPLANT
DRAPE C-ARMOR (DRAPES) ×2 IMPLANT
DRAPE ORTHO SPLIT 77X108 STRL (DRAPES) ×4
DRAPE PROXIMA HALF (DRAPES) ×4 IMPLANT
DRAPE SURG 17X23 STRL (DRAPES) ×2 IMPLANT
DRAPE SURG ORHT 6 SPLT 77X108 (DRAPES) ×2 IMPLANT
DRAPE U-SHAPE 47X51 STRL (DRAPES) ×2 IMPLANT
DRILL 3.3MM (BIT) ×2
DRILL 4.3MM (BIT) ×2
DRILL BIT 4.3 (BIT) ×2
DRSG ADAPTIC 3X8 NADH LF (GAUZE/BANDAGES/DRESSINGS) ×2 IMPLANT
DRSG MEPILEX BORDER 4X12 (GAUZE/BANDAGES/DRESSINGS) IMPLANT
DRSG MEPILEX BORDER 4X4 (GAUZE/BANDAGES/DRESSINGS) IMPLANT
DRSG MEPILEX BORDER 4X8 (GAUZE/BANDAGES/DRESSINGS) ×2 IMPLANT
DRSG PAD ABDOMINAL 8X10 ST (GAUZE/BANDAGES/DRESSINGS) ×3 IMPLANT
ELECT REM PT RETURN 9FT ADLT (ELECTROSURGICAL) ×2
ELECTRODE REM PT RTRN 9FT ADLT (ELECTROSURGICAL) ×1 IMPLANT
GAUZE SPONGE 4X4 12PLY STRL (GAUZE/BANDAGES/DRESSINGS) ×2 IMPLANT
GAUZE SPONGE 4X4 12PLY STRL LF (GAUZE/BANDAGES/DRESSINGS) ×1 IMPLANT
GLOVE BIO SURGEON STRL SZ7.5 (GLOVE) ×8 IMPLANT
GLOVE BIOGEL PI IND STRL 7.5 (GLOVE) ×1 IMPLANT
GLOVE BIOGEL PI INDICATOR 7.5 (GLOVE) ×1
GOWN STRL REUS W/ TWL LRG LVL3 (GOWN DISPOSABLE) ×3 IMPLANT
GOWN STRL REUS W/TWL LRG LVL3 (GOWN DISPOSABLE) ×6
K-WIRE 2.0 (WIRE) ×2
K-WIRE FXSTD 280X2XNS SS (WIRE) ×1
KIT BASIN OR (CUSTOM PROCEDURE TRAY) ×2 IMPLANT
KIT TURNOVER KIT B (KITS) ×2 IMPLANT
KWIRE FXSTD 280X2XNS SS (WIRE) IMPLANT
NS IRRIG 1000ML POUR BTL (IV SOLUTION) ×2 IMPLANT
PACK TOTAL JOINT (CUSTOM PROCEDURE TRAY) ×2 IMPLANT
PAD ARMBOARD 7.5X6 YLW CONV (MISCELLANEOUS) ×4 IMPLANT
PAD CAST 4YDX4 CTTN HI CHSV (CAST SUPPLIES) ×1 IMPLANT
PADDING CAST COTTON 4X4 STRL (CAST SUPPLIES) ×2
PADDING CAST COTTON 6X4 STRL (CAST SUPPLIES) ×2 IMPLANT
PLATE DIST FEM 12H (Plate) ×1 IMPLANT
SCREW 5.0 70MM (Screw) ×1 IMPLANT
SCREW 5.0 80MM (Screw) ×2 IMPLANT
SCREW CORTICAL NCB 5.0X90MM (Screw) ×1 IMPLANT
SCREW NCB 4.0MX38M (Screw) ×1 IMPLANT
SCREW NCB 5.0X38 (Screw) ×1 IMPLANT
SCREW NCB 5.0X85MM (Screw) ×2 IMPLANT
SPONGE LAP 18X18 X RAY DECT (DISPOSABLE) IMPLANT
STAPLER VISISTAT 35W (STAPLE) ×2 IMPLANT
SUCTION FRAZIER HANDLE 10FR (MISCELLANEOUS) ×1
SUCTION TUBE FRAZIER 10FR DISP (MISCELLANEOUS) ×1 IMPLANT
SUT ETHILON 3 0 PS 1 (SUTURE) ×4 IMPLANT
SUT VIC AB 0 CT1 27 (SUTURE) ×4
SUT VIC AB 0 CT1 27XBRD ANBCTR (SUTURE) IMPLANT
SUT VIC AB 1 CT1 27 (SUTURE)
SUT VIC AB 1 CT1 27XBRD ANBCTR (SUTURE) IMPLANT
SUT VIC AB 2-0 CT1 27 (SUTURE) ×2
SUT VIC AB 2-0 CT1 TAPERPNT 27 (SUTURE) ×2 IMPLANT
TOWEL OR 17X26 10 PK STRL BLUE (TOWEL DISPOSABLE) ×3 IMPLANT
TRAY FOLEY MTR SLVR 16FR STAT (SET/KITS/TRAYS/PACK) IMPLANT
WATER STERILE IRR 1000ML POUR (IV SOLUTION) ×4 IMPLANT

## 2017-08-08 NOTE — Anesthesia Procedure Notes (Signed)
Procedure Name: Intubation Date/Time: 08/08/2017 1:45 PM Performed by: Cleda Daub, CRNA Pre-anesthesia Checklist: Patient identified, Emergency Drugs available, Suction available and Patient being monitored Patient Re-evaluated:Patient Re-evaluated prior to induction Oxygen Delivery Method: Circle system utilized Preoxygenation: Pre-oxygenation with 100% oxygen Induction Type: IV induction Ventilation: Mask ventilation without difficulty and Mask ventilation throughout procedure Laryngoscope Size: Glidescope and 4 Grade View: Grade I Tube type: Oral Tube size: 7.0 mm Number of attempts: 1 Airway Equipment and Method: Stylet Placement Confirmation: ETT inserted through vocal cords under direct vision,  positive ETCO2 and breath sounds checked- equal and bilateral Secured at: 21 cm Tube secured with: Tape Dental Injury: Teeth and Oropharynx as per pre-operative assessment  Comments: Pt was experiencing severe neck pain in the preop; she was able to hyper-extend but wasn't able to flex the neck; no neck manipulation during mask-ventilation and intubation. Kelp the head and neck aligned and neutral.

## 2017-08-08 NOTE — Progress Notes (Addendum)
Patient has started to complain of pain at the back of her neck. Patient has been repositioned heat pack applied. Morphine has been given q3h and does not seen to help. Patient will now receive Norco and Robaxin to see if it will help her pain. Charge nurse made aware.

## 2017-08-08 NOTE — Consult Note (Signed)
Orthopaedic Trauma Service (OTS) Consult   Patient ID: [name] MRN: [id number] DOB/AGE: 82-Apr-1935 82 y.o.  Reason for Consult:Left periprosthetic femur fracture Referring Physician: Dr. Zollie Beckers, MD [facility]  HPI: [name] is an 84 y.o. female who is being seen in consultation at the request of Dr. Ninfa Linden for evaluation of left periprosthetic distal femur fracture.  Patient sustained a mechanical fall on 08/06/2017.  She was brought to the emergency room and was found to have a distal femoral periprosthetic femur fracture.  She had the total knee performed in [facility] around 2006.  She has a history of A. fib is on Coumadin.  She also has a history of TIA as well as hypertension depression asthma. We were consulted due to the complexity of her injury and need for an orthopaedic traumatologist. Patient examined on [place] and husband is at bedside this AM. At baseline she ambulates with a walker. She had a hemoglobin of 7.1 yesterday and received two units of PRBCs  Past Medical History:  Diagnosis Date  . Arthritis    "fingers, hips, feet, ankles" (11/10/2012)  . Asthma   . Atrial fibrillation (HCC)    CHRONIC COUMADIN  . Breast cancer ([place]) 1990's   "cancer on one side; precancerous tissue on the other" (11/10/2012)  . Chronic bronchitis ([place])    "multiple times; not in a long time" (11/10/2012  . Depression   . GERD (gastroesophageal reflux disease)   . Hearing impaired   . Hypertension   . Irritable bowel   . Lymphedema    HX OF - IN LEFT ARM--NO NEEDLES OR B/P'S LEFT ARM  . Macular degeneration    "BEGINNINGS OF" MACULAR DEGENERATION  . Osteoporosis   . Pneumonia    "multiple times; not in a long time" (11/10/2012)  . Recurrent UTI   . Sleep apnea    "dx'd w/it; don't wear mask or anything" (11/10/2012)  . Stroke ([place]) ~ 2005   HX OF TIA-NO RESIDUAL PROBLEM--PARALYSIS RT SIDE FACE /PT'S MOUTH DROOPS-AND LOSS OF HEARING RT EAR --SINCE EAR SURGERY AS A  CHILD   . UTI (lower urinary tract infection)    FREQUENT    Past Surgical History:  Procedure Laterality Date  . BREAST BIOPSY Bilateral 1990's  . Dacryocystorhinostomy  02/18/2016  . INNER EAR SURGERY Right    MULTIPLE EAR SURGERIES,  . JOINT REPLACEMENT    . KNEE ARTHROSCOPY  04/07/2012   Procedure: ARTHROSCOPY KNEE;  Surgeon: Gearlean Alf, MD;  Location: [facility];  Service: Orthopedics;  Laterality: Left;  WITH SYNOVECTOMY  . MASTECTOMY Bilateral 1990's  . MASTOIDECTOMY Right 1942  . TONSILLECTOMY    . TOTAL KNEE ARTHROPLASTY  09/20/2011   LEFT TOTAL KNEE ARTHROPLASTY;  Surgeon: Gearlean Alf, MD;  Location: [facility];  Service: Orthopedics;  Laterality: Left;  . TOTAL KNEE ARTHROPLASTY  2006    Family History  Problem Relation Age of Onset  . CAD Mother   . Stroke Mother   . CAD Father     Social History:  reports that she has never smoked. She has never used smokeless tobacco. She reports that she does not drink alcohol or use drugs.  Allergies:  Allergies  Allergen Reactions  . Sulfamethoxazole Diarrhea  . Ampicillin Other (See Comments)    C-DIF AFTER TAKING AMPICILLIN    Has patient had a PCN reaction causing immediate rash, facial/tongue/throat swelling, SOB or lightheadedness with hypotension: No Has patient had a PCN reaction causing  severe rash involving mucus membranes or skin necrosis: No Has patient had a PCN reaction that required hospitalization: No Has patient had a PCN reaction occurring within the last 10 years: No If all of the above answers are "NO", then may proceed with Cephalosporin use.  . Clindamycin/Lincomycin Other (See Comments)    PT STATES HER DOCTOR TOLD HER NOT TO TAKE CLINDAMYCIN BECAUSE SHE GOT C-DIFF AFTER TAKING AMPICILLIN  . Latex Other (See Comments)    Blisters   . Pradaxa [Dabigatran Etexilate Mesylate] Other (See Comments)    INTERNAL BLEEDING  . Ciprofloxacin Rash    RASH PT STATES ANY OTHER DRUGS IN  [facility] FAMILY SHE IS ALLERGIC TO    Medications:  No current facility-administered medications on file prior to encounter.    Current Outpatient Medications on File Prior to Encounter  Medication Sig Dispense Refill  . atorvastatin (LIPITOR) 20 MG tablet Take 20 mg by mouth daily after supper.     . chlorthalidone (HYGROTON) 50 MG tablet Take 50 mg by mouth daily.     Marland Kitchen CRANBERRY PO Take 1 capsule by mouth 2 (two) times daily.     . diphenhydramine-acetaminophen (TYLENOL PM) 25-500 MG TABS tablet Take 1 tablet by mouth at bedtime.    Marland Kitchen estradiol (ESTRACE) 0.1 MG/GM vaginal cream Place 1 Applicatorful vaginally See admin instructions. Apply vaginally twice weekly    . loperamide (IMODIUM) 2 MG capsule Take 2-4 mg by mouth 5 (five) times daily as needed for diarrhea or loose stools.     Marland Kitchen losartan (COZAAR) 100 MG tablet Take 100 mg by mouth daily.    . metoprolol succinate (TOPROL-XL) 25 MG 24 hr tablet Take 1 tablet (25 mg total) by mouth daily. (Patient taking differently: Take 25 mg by mouth 2 (two) times daily. ) 30 tablet 12  . Multiple Vitamin (MULTIVITAMIN WITH MINERALS) TABS tablet Take 1 tablet by mouth daily.    . Multiple Vitamins-Minerals (PRESERVISION/LUTEIN) CAPS Take 1 capsule by mouth daily.     . pantoprazole (PROTONIX) 40 MG tablet Take 40 mg by mouth daily.    Vladimir Faster Glycol-Propyl Glycol (SYSTANE) 0.4-0.3 % GEL ophthalmic gel Place 1 application into both eyes See admin instructions. Apply to both eyes every other night at bedtime (alternate with drops)    . Polyethyl Glycol-Propyl Glycol (SYSTANE) 0.4-0.3 % SOLN Place 1 drop into both eyes See admin instructions. Instill one drop into both eyes every other night at bedtime (alternate with gel)    . Probiotic Product (VSL#3 PO) Take 1 capsule by mouth daily.     . ranitidine (ZANTAC) 150 MG capsule Take 150 mg by mouth daily as needed for heartburn.     . saccharomyces boulardii (FLORASTOR) 250 MG capsule Take 250-500 mg  by mouth See admin instructions. Take one tablet (250 mg) by mouth every morning and two tablets (500 mg) at night    . warfarin (COUMADIN) 5 MG tablet Take 5 mg by mouth daily with supper. 5mg  by mouth daily, except 7.5mg  on Saturdays    . diazepam (VALIUM) 2 MG tablet Take 1 tablet (2 mg total) by mouth every 8 (eight) hours as needed (dizziness). (Patient not taking: Reported on 08/06/2017) 10 tablet 0  . mirabegron ER (MYRBETRIQ) 25 MG TB24 tablet Take 25 mg by mouth every evening.    . ondansetron (ZOFRAN ODT) 4 MG disintegrating tablet Take 1 tablet (4 mg total) by mouth every 8 (eight) hours as needed for nausea or vomiting. (Patient  not taking: Reported on 08/06/2017) 10 tablet 0    ROS: Constitutional: No fever or chills Vision: No changes in vision ENT: No difficulty swallowing CV: No chest pain Pulm: No SOB or wheezing GI: No nausea or vomiting GU: No urgency or inability to hold urine Skin: No poor wound healing Neurologic: No numbness or tingling Psychiatric: No depression or anxiety Heme: No bruising Allergic: No reaction to medications or food   Exam: Blood pressure (!) 183/70, pulse 79, temperature 98.4 F (36.9 C), temperature source Oral, resp. rate 16, height 5' 4.5" (1.638 m), weight 73 kg (161 lb), SpO2 100 %. General:No acute distress Orientation:Awake and alert Mood and Affect: Cooperative and pleasant Gait: Unable to assess Coordination and balance: Within normal limits  Left upper extremity: Skin without lesions, in knee immobilizer Obvious deformity about distal thigh. Moves foot and ankle, no deformity. Endorses sensation to dorsum and plantar aspect of foot.  Right lower extremity: Skin without lesions. No tenderness to palpation. Full painless ROM, full strength in each muscle groups without evidence of instability.   Medical Decision Making: Imaging: X-rays of the left femur as well as CT scan of the left knee show a periprosthetic distal femur fracture  with significant comminution.  The fracture lines do not appear to extend distal to the prosthesis and prosthesis appears to be stable.  Labs:  CBC    Component Value Date/Time   WBC 9.5 08/08/2017 0001   RBC 3.57 (L) 08/08/2017 0001   HGB 9.8 (L) 08/08/2017 0001   HCT 29.9 (L) 08/08/2017 0001   PLT 187 08/08/2017 0001   MCV 83.8 08/08/2017 0001   MCH 27.5 08/08/2017 0001   MCHC 32.8 08/08/2017 0001   RDW 13.9 08/08/2017 0001   LYMPHSABS 1.1 08/08/2017 0001   MONOABS 0.6 08/08/2017 0001   EOSABS 0.0 08/08/2017 0001   BASOSABS 0.0 08/08/2017 0001   INR from 5/11: 1.66  Medical history and chart was reviewed  Assessment/Plan: 84 year old female with a history of A. fib on chronic Coumadin with a left periprosthetic distal femur fracture.  The patient requires open reduction internal fixation.  We will attempt to do this today.  I discussed the risks and benefits with the patient and her husband.  Hopefully will be able to perform the surgery if we have to delay it would be tomorrow.  Risks discussed included bleeding requiring blood transfusion, bleeding causing a hematoma, infection, malunion, nonunion, damage to surrounding nerves and blood vessels, pain, hardware prominence or irritation, periprosthetic femur fracture, hardware failure, stiffness, post-traumatic arthritis, DVT/PE, compartment syndrome, and even death.  Appreciate cardiology's input.  Echo reading is pending this morning.  Her troponins have remained stable.  Her hemoglobin has improved after 2 units of PRBCs.  Will update status postoperatively.  Shona Needles, MD [facility] [phone] (phone)

## 2017-08-08 NOTE — Anesthesia Preprocedure Evaluation (Signed)
Anesthesia Evaluation  Patient identified by MRN, date of birth, ID band Patient awake and Patient confused    Reviewed: Allergy & Precautions, NPO status , Patient's Chart, lab work & pertinent test results  Airway Mallampati: II  TM Distance: >3 FB Neck ROM: Full    Dental no notable dental hx.    Pulmonary asthma , sleep apnea ,    Pulmonary exam normal breath sounds clear to auscultation       Cardiovascular hypertension, Pt. on medications Normal cardiovascular exam+ dysrhythmias Atrial Fibrillation  Rhythm:Regular Rate:Normal     Neuro/Psych TIAnegative psych ROS   GI/Hepatic negative GI ROS, Neg liver ROS,   Endo/Other  negative endocrine ROS  Renal/GU negative Renal ROS  negative genitourinary   Musculoskeletal negative musculoskeletal ROS (+)   Abdominal   Peds negative pediatric ROS (+)  Hematology negative hematology ROS (+)   Anesthesia Other Findings   Reproductive/Obstetrics negative OB ROS                             Anesthesia Physical Anesthesia Plan  ASA: III  Anesthesia Plan: General   Post-op Pain Management:    Induction: Intravenous  PONV Risk Score and Plan: 3 and Ondansetron and Treatment may vary due to age or medical condition  Airway Management Planned: LMA and Oral ETT  Additional Equipment:   Intra-op Plan:   Post-operative Plan: Extubation in OR  Informed Consent: I have reviewed the patients History and Physical, chart, labs and discussed the procedure including the risks, benefits and alternatives for the proposed anesthesia with the patient or authorized representative who has indicated his/her understanding and acceptance.   Dental advisory given  Plan Discussed with: CRNA  Anesthesia Plan Comments:         Anesthesia Quick Evaluation

## 2017-08-08 NOTE — Transfer of Care (Signed)
Immediate Anesthesia Transfer of Care Note  Patient: [name]  Procedure(s) Performed: OPEN REDUCTION INTERNAL FIXATION (ORIF) DISTAL FEMUR FRACTURE (Left )  Patient Location: PACU  Anesthesia Type:General  Level of Consciousness: drowsy and patient cooperative  Airway & Oxygen Therapy: Patient Spontanous Breathing and Patient connected to face mask oxygen  Post-op Assessment: Report given to RN and Post -op Vital signs reviewed and stable  Post vital signs: Reviewed and stable  Last Vitals:  Vitals Value Taken Time  BP 198/98 08/08/2017  3:23 PM  Temp    Pulse 94 08/08/2017  3:27 PM  Resp 13 08/08/2017  3:27 PM  SpO2 99 % 08/08/2017  3:27 PM  Vitals shown include unvalidated device data.  Last Pain:  Vitals:   08/08/17 0844  TempSrc:   PainSc: 3          Complications: No apparent anesthesia complications

## 2017-08-08 NOTE — Progress Notes (Signed)
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 She felt dizzy she had her cain with her. No head injury no LOC.  Denies black stool or blood in stool. Reports recent symptoms of UTI. Reports diarrhea recently x3 no vomiting but slight nausea Husband gave her immodium.  She has not slept well. She went to urology on Friday and gave a sample.  UA positive for UTI last time she was treated with Manurol.  Last urine culture Grew Proteus resistant to tetracycline  At baseline able to walk up a stair case or walk a block with out chest pain.  Regarding pertinent Chronic problems:Atrial fibrillation on Coumadin. No hx of CAD  While in ER: Found to have left periprosthetic distal femur fracture similar to prior total knee replacement Placed in knee immobilizer seen by orthopedics    Assessment & Plan:   Principal Problem:   Periprosthetic fracture of shaft of femur Active Problems:   Diarrhea   Paroxysmal atrial fibrillation (HCC)   Hypertension   Anemia   Lightheadedness   Femur fracture, left (HCC)   Dehydration   Acute lower UTI  1] left comminuted periprosthetic femur fracture status post mechanical fall.  Seen by Ortho.  CT of the left  hip was ordered because of the complex nature of the fracture in the periprosthetic area.  CT showedComminuted distal left femoral fracture extending to the level of the femoral portion of the knee prosthesis. The prosthesis does not appear to be significantly displaced. The distal fracture fragments are displaced posteriorly and laterally as described with some degree of impaction at the fracture site as  well.  Patient to OR for ORIF 08/08/2017.  2] anemia patient has over a 2 g drop in her hemoglobin from 9-7.1.  Iron panels were done.  Will transfuse 1 unit of PRBC.  Hemoglobin up to 9.8 from 7.1.  3] paroxysmal atrial fibrillation-resume anticoagulation when okay with Ortho.  Patient takes Coumadin at home.  Her hemoglobin has dropped from 9.4-7.1.  Clear source of bleeding.  Cardiology recommended no bridging unless patient goes into A. fib again.  At this time Coumadin is on hold for surgery.  Continue metoprolol follow-up echo.    4] stage III CKD stable  5] type 2 diabetes sliding scale insulin since surgery is done.  Restart home medications after surgery.  6] history of stroke   7] diarrhea C. difficile pending and probiotics.  8]htn  Cozaar on hold restart after surgery as needed.  Continue metoprolol    DVT prophylaxis:scd Code Status:full Family Communication:dw husband Disposition Plan:  Patient to go to the OR 08/08/2017 for hip repair patient came from home but she will need to be discharged to skilled nursing facility after surgery. Consultants: Ortho Procedures: None Antimicrobials: None  Subjective: Patient resting has been by the bedside waiting for surgery denies any new complaints   Objective: Vitals:   08/07/17 1836 08/07/17 2000 08/07/17 2008 08/08/17 0427  BP: (!) 160/67 (!) 159/74 (!) 178/76 (!) 183/70  Pulse: 77 97 71 79  Resp: 20  16   Temp: 98.4 F (36.9 C) 98.3 F (36.8 C) 98.8 F (37.1 C) 98.4 F (36.9 C)  TempSrc: Oral Oral Axillary  Oral  SpO2: 97% 97% 98% 100%  Weight:      Height:        Intake/Output Summary (Last 24 hours) at 08/08/2017 1135 Last data filed at 08/08/2017 0830 Gross per 24 hour  Intake 1195 ml  Output 1000 ml  Net 195 ml   Filed Weights   08/06/17 1622  Weight: 73 kg (161 lb)    Examination:  General exam: Appears calm and comfortable  Respiratory system: Clear to auscultation. Respiratory effort  normal. Cardiovascular system: S1 & S2 heard, RRR. No JVD, murmurs, rubs, gallops or clicks. No pedal edema. Gastrointestinal system: Abdomen is nondistended, soft and nontender. No organomegaly or masses felt. Normal bowel sounds heard. Central nervous system: Alert and oriented. No focal neurological deficits. Extremities: Symmetric 5 x 5 power. Skin: No rashes, lesions or ulcers Psychiatry: Judgement and insight appear normal. Mood & affect appropriate.     Data Reviewed: I have personally reviewed following labs and imaging studies  CBC: Recent Labs  Lab 08/06/17 1630 08/07/17 0845 08/08/17 0001  WBC 10.9* 9.3 9.5  NEUTROABS  --   --  7.8*  HGB 9.4* 7.1* 9.8*  HCT 29.6* 22.2* 29.9*  MCV 86.0 85.4 83.8  PLT 270 236 962   Basic Metabolic Panel: Recent Labs  Lab 08/06/17 1630 08/07/17 0845 08/08/17 0001  NA 139 136 136  K 3.5 3.9 3.8  CL 104 105 104  CO2 25 23 22   GLUCOSE 175* 124* 135*  BUN 39* 31* 22*  CREATININE 1.55* 1.27* 1.12*  CALCIUM 9.1 8.2* 8.5*   GFR: Estimated Creatinine Clearance: 38.3 mL/min (A) (by C-G formula based on SCr of 1.12 mg/dL (H)). Liver Function Tests: Recent Labs  Lab 08/06/17 1630 08/07/17 0845  AST 20  --   ALT 17  --   ALKPHOS 72  --   BILITOT 0.4  --   PROT 6.6  --   ALBUMIN 3.7 3.1*   No results for input(s): LIPASE, AMYLASE in the last 168 hours. No results for input(s): AMMONIA in the last 168 hours. Coagulation Profile: Recent Labs  Lab 08/06/17 1630  INR 1.66   Cardiac Enzymes: Recent Labs  Lab 08/07/17 0006 08/07/17 0845 08/07/17 1320 08/08/17 0001 08/08/17 0553  TROPONINI <0.03 0.03* 0.03* 0.03* 0.03*   BNP (last 3 results) No results for input(s): PROBNP in the last 8760 hours. HbA1C: No results for input(s): HGBA1C in the last 72 hours. CBG: No results for input(s): GLUCAP in the last 168 hours. Lipid Profile: No results for input(s): CHOL, HDL, LDLCALC, TRIG, CHOLHDL, LDLDIRECT in the last 72  hours. Thyroid Function Tests: No results for input(s): TSH, T4TOTAL, FREET4, T3FREE, THYROIDAB in the last 72 hours. Anemia Panel: Recent Labs    08/07/17 0006  VITAMINB12 620  FOLATE 38.4  FERRITIN 123  TIBC 238*  IRON 19*  RETICCTPCT 1.2   Sepsis Labs: No results for input(s): PROCALCITON, LATICACIDVEN in the last 168 hours.  Recent Results (from the past 240 hour(s))  Urine Culture     Status: Abnormal   Collection Time: 08/07/17  4:31 AM  Result Value Ref Range Status   Specimen Description URINE, CLEAN CATCH  Final   Special Requests   Final    NONE Performed at [facility], [address], [place], [place] [address]    Culture MULTIPLE SPECIES PRESENT, SUGGEST RECOLLECTION (A)  Final   Report Status 08/08/2017 FINAL  Final  Surgical pcr screen     Status:  None   Collection Time: 08/08/17 12:11 AM  Result Value Ref Range Status   MRSA, PCR NEGATIVE NEGATIVE Final   Staphylococcus aureus NEGATIVE NEGATIVE Final    Comment: (NOTE) The Xpert SA Assay (FDA approved for NASAL specimens in patients 9 years of age and older), is one component of a comprehensive surveillance program. It is not intended to diagnose infection nor to guide or monitor treatment. Performed at [facility], [place] [address], [place], [place] [address]          Radiology Studies: Dg Chest 1 View  Result Date: 08/06/2017 CLINICAL DATA:  84 year old female status post fall, in severe pain. Acute left femur fracture. EXAM: CHEST  1 VIEW COMPARISON:  Chest CTA and radiographs 11/04/2015 and earlier. FINDINGS: Portable AP supine view at 1712 hours. Stable cardiomegaly and mediastinal contours. Stable tortuous thoracic aorta. Visualized tracheal air column is within normal limits. Allowing for portable technique the lungs are clear. Osteopenia. No acute osseous abnormality identified. IMPRESSION: No acute cardiopulmonary abnormality. Electronically Signed   By: Genevie Ann M.D.   On:  08/06/2017 17:37   Dg Pelvis 1-2 Views  Result Date: 08/06/2017 CLINICAL DATA:  84 year old female status post fall, in severe pain. Acute left femur fracture. EXAM: PELVIS - 1-2 VIEW COMPARISON:  Left femur series today reported separately. CT Abdomen and Pelvis 08/11/2012. FINDINGS: Supine view at 1713 hours. The femoral heads remain normally located. The proximal femurs appear intact. The pelvis appears stable and intact. Sacral ala and SI joints appear stable and intact. Stable chronic lower lumbar degenerative facet hypertrophy. Negative visible bowel gas pattern. IMPRESSION: No acute fracture or dislocation identified about the pelvis. Electronically Signed   By: Genevie Ann M.D.   On: 08/06/2017 17:37   Ct Head Wo Contrast  Result Date: 08/06/2017 CLINICAL DATA:  [place] today onto LEFT side, head trauma, history atrial fibrillation, chronic anticoagulation therapy, high clinical risk, history breast cancer, asthma, hypertension, stroke EXAM: CT HEAD WITHOUT CONTRAST CT CERVICAL SPINE WITHOUT CONTRAST TECHNIQUE: Multidetector CT imaging of the head and cervical spine was performed following the standard protocol without intravenous contrast. Multiplanar CT image reconstructions of the cervical spine were also generated. COMPARISON:  01/28/2017 CT head, CT cervical spine 08/18/2010 FINDINGS: CT HEAD FINDINGS Brain: Generalized atrophy. Normal ventricular morphology. No midline shift or mass effect. Small vessel chronic ischemic changes of deep cerebral white matter. Old RIGHT posterior parietal and LEFT cerebellar infarcts. Old lacunar infarcts BILATERAL basal ganglia. No intracranial hemorrhage, mass lesion, evidence of acute infarction, or extra-axial fluid collection. Vascular: Atherosclerotic calcification of internal carotid and vertebral arteries bilaterally at skull base Skull: Skull intact Sinuses/Orbits: Small amount of fluid/mucous in the RIGHT sphenoid sinus. Opacified LEFT ethmoid air cell.  Visualized paranasal sinuses and LEFT mastoid air cells otherwise clear. Prior RIGHT mastoidectomy. Nasal septal deviation to the RIGHT. Other: N/A CT CERVICAL SPINE FINDINGS Alignment: Normal, stable Skull base and vertebrae: Diffuse osseous demineralization. Scattered facet degenerative changes. Ankylosis of the LEFT C4-C5 facet joint. Visualized skull base intact. No acute fracture or bone destruction. Soft tissues and spinal canal: Prevertebral soft tissues normal thickness. Atherosclerotic calcifications at the LEFT carotid bifurcation and proximal great vessels. Disc levels:  No additional abnormalities Upper chest: Lung apices clear Other: N/A IMPRESSION: Atrophy with small vessel chronic ischemic changes of deep cerebral white matter. Old RIGHT posterior parietal and LEFT cerebellar infarcts as well as BILATERAL basal ganglia lacunar infarcts. No acute intracranial abnormalities. Degenerative facet disease changes of the  cervical spine. No acute cervical spine abnormalities. Electronically Signed   By: Lavonia Dana M.D.   On: 08/06/2017 18:04   Ct Cervical Spine Wo Contrast  Result Date: 08/06/2017 CLINICAL DATA:  [place] today onto LEFT side, head trauma, history atrial fibrillation, chronic anticoagulation therapy, high clinical risk, history breast cancer, asthma, hypertension, stroke EXAM: CT HEAD WITHOUT CONTRAST CT CERVICAL SPINE WITHOUT CONTRAST TECHNIQUE: Multidetector CT imaging of the head and cervical spine was performed following the standard protocol without intravenous contrast. Multiplanar CT image reconstructions of the cervical spine were also generated. COMPARISON:  01/28/2017 CT head, CT cervical spine 08/18/2010 FINDINGS: CT HEAD FINDINGS Brain: Generalized atrophy. Normal ventricular morphology. No midline shift or mass effect. Small vessel chronic ischemic changes of deep cerebral white matter. Old RIGHT posterior parietal and LEFT cerebellar infarcts. Old lacunar infarcts BILATERAL  basal ganglia. No intracranial hemorrhage, mass lesion, evidence of acute infarction, or extra-axial fluid collection. Vascular: Atherosclerotic calcification of internal carotid and vertebral arteries bilaterally at skull base Skull: Skull intact Sinuses/Orbits: Small amount of fluid/mucous in the RIGHT sphenoid sinus. Opacified LEFT ethmoid air cell. Visualized paranasal sinuses and LEFT mastoid air cells otherwise clear. Prior RIGHT mastoidectomy. Nasal septal deviation to the RIGHT. Other: N/A CT CERVICAL SPINE FINDINGS Alignment: Normal, stable Skull base and vertebrae: Diffuse osseous demineralization. Scattered facet degenerative changes. Ankylosis of the LEFT C4-C5 facet joint. Visualized skull base intact. No acute fracture or bone destruction. Soft tissues and spinal canal: Prevertebral soft tissues normal thickness. Atherosclerotic calcifications at the LEFT carotid bifurcation and proximal great vessels. Disc levels:  No additional abnormalities Upper chest: Lung apices clear Other: N/A IMPRESSION: Atrophy with small vessel chronic ischemic changes of deep cerebral white matter. Old RIGHT posterior parietal and LEFT cerebellar infarcts as well as BILATERAL basal ganglia lacunar infarcts. No acute intracranial abnormalities. Degenerative facet disease changes of the cervical spine. No acute cervical spine abnormalities. Electronically Signed   By: Lavonia Dana M.D.   On: 08/06/2017 18:04   Ct Knee Left Wo Contrast  Result Date: 08/06/2017 CLINICAL DATA:  Recent fall with known distal femoral fracture EXAM: CT OF THE LEFT KNEE WITHOUT CONTRAST TECHNIQUE: Multidetector CT imaging of the left knee was performed according to the standard protocol. Multiplanar CT image reconstructions were also generated. COMPARISON:  Plain films from earlier in the same day. FINDINGS: Bones/Joint/Cartilage There is a comminuted fracture of the distal femur extending from the diaphysis into the metaphysis. The prosthesis is  not significantly displaced although the fracture lines do extend to the level of the distal femur adjacent to the prosthesis. Mild posterior displacement of the distal fracture fragments of approximately 1.7 cm is noted. Lateral displacement of the fracture fragments is seen of approximately 2.4 cm. This is a rough estimate due to the complex nature of the fracture. Additionally mild impaction of the distal fracture fragments is seen. The tibial component of the prosthesis is within normal limits as is the proximal tibia and fibula. Ligaments Suboptimally assessed by CT. Muscles and Tendons The surrounding musculature appears within normal limits with the exception of some mild edema related to the recent injury. Soft tissues Mild adjacent soft tissue swelling is noted in the region of the femoral fracture. No definitive arterial or venous abnormality is noted although the lack of contrast limits evaluation. Diffuse vascular calcifications are seen. IMPRESSION: Comminuted distal left femoral fracture extending to the level of the femoral portion of the knee prosthesis. The prosthesis does not appear to be significantly displaced.  The distal fracture fragments are displaced posteriorly and laterally as described with some degree of impaction at the fracture site as well. Electronically Signed   By: Inez Catalina M.D.   On: 08/06/2017 20:14   Dg Femur Min 2 Views Left  Result Date: 08/06/2017 CLINICAL DATA:  Status post placement of a knee immobilizer EXAM: LEFT FEMUR 2 VIEWS COMPARISON:  Films from earlier in the same day. FINDINGS: The proximal left femur is within normal limits. The previously seen comminuted distal femoral fracture is again seen. The degree of angulation and displacement has reduced somewhat. No other focal abnormality is noted. IMPRESSION: Slight reduction in distal femoral fracture. Electronically Signed   By: Inez Catalina M.D.   On: 08/06/2017 19:13   Dg Femur Min 2 Views Left  Result  Date: 08/06/2017 CLINICAL DATA:  84 year old female status post fall, in severe pain. Externally rotated left lower leg. EXAM: LEFT FEMUR 2 VIEWS COMPARISON:  Left tib-fib series 06/10/2009. FINDINGS: The left femoral head remains normally located in the proximal left femur appears intact. The left hemipelvis appears intact. Osteopenia. Spiral comminuted, displaced, and impacted fracture of the distal left femur metadiaphysis. The fracture extends to involve left total knee femoral hardware. There is a large 9 centimeter butterfly fragment projecting obliquely over the distal femoral shaft. There is greater than 1 full shaft width posterior displacement and superimposed posterior angulation. There is also rotation of the distal fragment. IMPRESSION: 1. Severely comminuted fracture of the distal left femur metadiaphysis extending to involve preexisting left total knee hardware. Greater than 1 full shaft width posterior displacement with superimposed angulation and rotation of the distal fragment. 2. The mid and proximal left femur remain intact. Electronically Signed   By: Genevie Ann M.D.   On: 08/06/2017 17:36        Scheduled Meds: . atorvastatin  20 mg Oral QPC supper  . chlorhexidine  60 mL Topical Once  . famotidine  10 mg Oral Daily  . metoprolol succinate  25 mg Oral BID  . pantoprazole  40 mg Oral Daily  . povidone-iodine  2 application Topical Once  . saccharomyces boulardii  250 mg Oral BID   Continuous Infusions: . sodium chloride    .  ceFAZolin (ANCEF) IV    . cefTRIAXone (ROCEPHIN) IVPB 1 gram/100 mL NS (Mini-Bag Plus) Stopped (08/07/17 2320)  . methocarbamol (ROBAXIN)  IV       LOS: 2 days     Georgette Shell, MD [facility] If 7PM-7AM, please contact night-coverage [email or web address] Password [email or web address] 08/08/2017, 11:35 AM

## 2017-08-08 NOTE — Op Note (Addendum)
OrthopaedicSurgeryOperativeNote ([id number]) Date of Surgery: 08/08/2017  Admit Date: 08/06/2017   Diagnoses: Pre-Op Diagnoses: Left periprosthetic distal femur fracture  Post-Op Diagnosis: Same  Procedures: CPT 27511-Open reduction internal fixation of left periprosthetic femur fracture  Surgeons: Primary: Haddix, Thomasene Lot, MD   Location:[facility] OR ROOM 05   AnesthesiaGeneral   Antibiotics:Ancef 2g preop   Tourniquettime:None  [id number]:19 mL   Complications:None  Specimens:None   Implants: Implant Name Type Inv. Item Serial No. Manufacturer Lot No. LRB No. Used Action  SCREW CORTICAL NCB 5.0X90MM - [id number] Screw SCREW CORTICAL NCB 5.0X90MM  [employer](ORTH,TRAU,[employer],SG) [id number] Left 1 Implanted  PLATE DIST FEM 33A - [id number] Plate PLATE DIST FEM 34L  [employer](ORTH,TRAU,BIO,SG)  Left 1 Implanted  CAP LOCK NCB - [id number] Cap CAP LOCK NCB  [employer](ORTH,TRAU,BIO,SG)  Left 7 Implanted  SCREW NCB 4.0MX38M - [id number] Screw SCREW NCB 4.0MX38M  [employer](ORTH,TRAU,BIO,SG)  Left 1 Implanted  SCREW 5.0 80MM - [id number] Screw SCREW 5.0 80MM  [employer](ORTH,TRAU,BIO,SG)  Left 2 Implanted  SCREW 5.0 70MM - [id number] Screw SCREW 5.0 70MM  [employer](ORTH,TRAU,BIO,SG)  Left 1 Implanted  SCREW NCB 5.0X38 - [id number] Screw SCREW NCB 5.0X38  [employer](ORTH,TRAU,BIO,SG)  Left 1 Implanted  SCREW NCB 5.0X85MM - [id number] Screw SCREW NCB 5.0X85MM  [employer](ORTH,TRAU,BIO,SG)  Left 2 Implanted    IndicationsforSurgery: 84 year old female with a history of A. fib on chronic Coumadin that had a ground-level fall and sustained a left periprosthetic distal femur fracture.  I was asked by Dr. Ninfa Linden to be involved in her care due to the complexity of her injury and need for an orthopedic traumatologist.  I discussed the risks and benefits of proceeding with surgical fixation with her and her husband. Risks discussed included bleeding requiring blood  transfusion, bleeding causing a hematoma, infection, malunion, nonunion, damage to surrounding nerves and blood vessels, pain, hardware prominence or irritation, hardware failure, periprosthetic fracture, stiffness, post-traumatic arthritis, DVT/PE, compartment syndrome, and even death.  Patient agreed to proceed with surgery and consent was obtained  Operative Findings: Open reduction internal fixation of left periprosthetic distal femur fracture using a [employer] distal NCB periprosthetic plate 12 hole.  Procedure: The patient was identified in the preoperative holding area. Consent was confirmed with the patient and their family and all questions were answered. The operative extremity was marked after confirmation with the patient. The patientwas then brought back to the operating room by our anesthesia colleagues. The patient was placed under general anesthesia and was carefully transferred over to a radiolucent flattop table. They were secured to the bed and all bony prominences were padded.  A bump was placed under the operative hip. The operative extremity was then prepped and draped in usual sterile fashion. A timeout was performed to verify the patient, the procedure and extremity. Preoperative antibiotics were dosed.  Fluoroscopy was used to identify the fracture. A lateral approach was made to the distal femur. It was taken down through the skin and the IT band was incised in line with the incision. The distal portion of the vastus lateralis was reflected off the IT band and the distal articular block of the lateral femoral condyle was exposed.   I used fluoroscopy to identify the correct length plate to bridge the whole femoral shaft. I chose a 12-hole plate. The aiming arm was attached to the plate and slide submuscularly under the vastus to the proximal femur. The distal portion of the plate was placed flush against the lateral cortex of the distal  articular block. A 3.77mm drill bit was  used to position the proximal portion of the plate. A perfect lateral was obtained to show appropriate positioning of the plate.  The distal segment was drilled and 5.62mm cortical screws were placed in the distal fracture segment. A total of 3 screws were placed initially. A 5.0 mm screw was placed percutaneously in the shaft to reduce the coronal alignment. A total of three shaft screws were placed to allow for a significant working length and micromotion of the fracture, the most proximal screw was 4.100mm. Locking caps were placed in the shaft screws except for the most proximal screw to prevent a stress riser. The targeting guide was removed. Three more screws were placed in the distal segment. Fluoroscopy was used to confirm adequate reduction of the fracture and appropriate position of the plate and screws.  The incisions were irrigated with normal saline. A gram of vancomycin powder was placed in the incision around the plate. The IT band was closed with 0-vicryl. The skin was closed with 2-vicryl, 3-0 nylon. The incisions were dressed with a Mepilex dressing and bactracin ointment, adaptec, 4x4s and sterile cast padding and the leg was wrapped with a ACE wrap. The patient was then transferred to the regular floor bed and taken to the PACU in stable condition.   Post Op Plan/Instructions: The patient will be weightbearing as tolerated to the left lower extremity.  She received postoperative antibiotics.  She may be restarted on anticoagulation on postoperative day 1 if her hemoglobin is within normal limits and she does not need a transfusion.  I was present and performed the entire surgery.  Katha Hamming, MD [facility]

## 2017-08-09 ENCOUNTER — Encounter (HOSPITAL_COMMUNITY): Payer: Self-pay | Admitting: Student

## 2017-08-09 ENCOUNTER — Inpatient Hospital Stay (HOSPITAL_COMMUNITY): Payer: Medicare Other

## 2017-08-09 DIAGNOSIS — S728X2A Other fracture of left femur, initial encounter for closed fracture: Secondary | ICD-10-CM

## 2017-08-09 LAB — BASIC METABOLIC PANEL
Anion gap: 13 (ref 5–15)
BUN: 18 mg/dL (ref 6–20)
CO2: 23 mmol/L (ref 22–32)
Calcium: 7.7 mg/dL — ABNORMAL LOW (ref 8.9–10.3)
Chloride: 101 mmol/L (ref 101–111)
Creatinine, Ser: 1.16 mg/dL — ABNORMAL HIGH (ref 0.44–1.00)
GFR calc Af Amer: 49 mL/min — ABNORMAL LOW (ref >60)
GFR calc non Af Amer: 43 mL/min — ABNORMAL LOW (ref >60)
Glucose, Bld: 147 mg/dL — ABNORMAL HIGH (ref 65–99)
Potassium: 4 mmol/L (ref 3.5–5.1)
Sodium: 137 mmol/L (ref 135–145)

## 2017-08-09 LAB — CBC WITH DIFFERENTIAL/PLATELET
Basophils Absolute: 0 10*3/uL (ref 0.0–0.1)
Basophils Relative: 0 %
Eosinophils Absolute: 0 10*3/uL (ref 0.0–0.7)
Eosinophils Relative: 0 %
HCT: 23.9 % — ABNORMAL LOW (ref 36.0–46.0)
Hemoglobin: 8.2 g/dL — ABNORMAL LOW (ref 12.0–15.0)
Lymphocytes Relative: 7 %
Lymphs Abs: 0.7 10*3/uL (ref 0.7–4.0)
MCH: 29 pg (ref 26.0–34.0)
MCHC: 34.3 g/dL (ref 30.0–36.0)
MCV: 84.5 fL (ref 78.0–100.0)
Monocytes Absolute: 1.2 10*3/uL — ABNORMAL HIGH (ref 0.1–1.0)
Monocytes Relative: 11 %
Neutro Abs: 8.6 10*3/uL — ABNORMAL HIGH (ref 1.7–7.7)
Neutrophils Relative %: 82 %
Platelets: 208 10*3/uL (ref 150–400)
RBC: 2.83 MIL/uL — ABNORMAL LOW (ref 3.87–5.11)
RDW: 14.7 % (ref 11.5–15.5)
WBC: 10.5 10*3/uL (ref 4.0–10.5)

## 2017-08-09 MED ORDER — WARFARIN SODIUM 5 MG PO TABS
5.0000 mg | ORAL_TABLET | Freq: Every day | ORAL | Status: DC
  Administered 2017-08-09: 5 mg via ORAL
  Filled 2017-08-09: qty 1

## 2017-08-09 MED ORDER — LOSARTAN POTASSIUM 50 MG PO TABS
100.0000 mg | ORAL_TABLET | Freq: Every day | ORAL | Status: DC
  Administered 2017-08-10 – 2017-08-11 (×2): 100 mg via ORAL
  Filled 2017-08-09 (×3): qty 2

## 2017-08-09 MED ORDER — LOPERAMIDE HCL 2 MG PO CAPS: 2.0000 mg | ORAL_CAPSULE | Freq: Every day | ORAL | Status: DC | PRN

## 2017-08-09 MED ORDER — WARFARIN - PHYSICIAN DOSING INPATIENT: Freq: Every day | Status: DC

## 2017-08-09 MED ORDER — MIRABEGRON ER 25 MG PO TB24
25.0000 mg | ORAL_TABLET | Freq: Every evening | ORAL | Status: DC
  Administered 2017-08-09 – 2017-08-10 (×2): 25 mg via ORAL
  Filled 2017-08-09 (×2): qty 1

## 2017-08-09 NOTE — Progress Notes (Addendum)
PROGRESS NOTE    [name]  YTK:[id number] DOB: 1933/05/08 DOA: 08/06/2017 PCP: Leanna Battles, MD   Brief Narrative:  84 y.o.femalewith medical history significant of CVAresidual right-sided facial droop secondary to mastoiditis as a child, a.fib on coumadin, breast cancer, GERD, HTN, HLD to the hospital after a fall she was noted to have left periprosthetic hip fracture.  Patient underwent open reduction and internal fixation by orthopedics after admission.    Following conditions were addressed during hospitalization,  Assessment & Plan:   Principal Problem:   Periprosthetic fracture of shaft of femur Active Problems:   Diarrhea   Paroxysmal atrial fibrillation (HCC)   Hypertension   Anemia   Lightheadedness   Femur fracture, left (HCC)   Dehydration   Acute lower UTI  Prosthetic hip fracture seen by orthopedics.  Status post open reduction internal fixation   Physical therapy consultation to follow.  Mild anemia likely secondary to blood loss.  Received packed RBC.  Hemoglobin of 8.2.  We will closely monitor on anticoagulants.  Diarrhea with volume depletion on presentation.  Received IV fluids..  No leukocytosis.  Not had further episodes of diarrhea.  History chronic kidney disease.  We will closely monitor.  Check BMP in a.m.  UTI.  On Rocephin.  Urine culture showed multiple species likely contamination.  Continue to complete a 3-day course  Paroxysmal atrial fibrillation, history of stroke.  Metoprolol.  Initiate anticoagulant due to high risk of prior history of stroke, ok with anticoagulation as per orthopedics will need to closely monitor hemoglobin.  Hypertension.  Restart Cozaar and metoprolol.    DVT prophylaxis: Initiate anticoagulants  Code Status: Code  Family Communication: With the patient's husband at bedside.  Disposition Plan: Rehab/SNF.   Consultants: Orthopedics  Procedures: Open reduction and internal fixation of left  periprosthetic hip fracture  Antimicrobials: Rocephin  Subjective: Patient complains of left shoulder pain.  Denies shortness of breath cough nausea or vomiting.  Objective: Vitals:   08/08/17 1755 08/08/17 1805 08/08/17 2104 08/09/17 0405  BP:  (!) 187/83 (!) 180/78 (!) 186/83  Pulse: 89 89 90 80  Resp: 17 18 16 16   Temp:  98 F (36.7 C) 98 F (36.7 C) 97.9 F (36.6 C)  TempSrc:  Oral Oral Oral  SpO2: 92% 97% 100% 100%  Weight:      Height:        Intake/Output Summary (Last 24 hours) at 08/09/2017 1230 Last data filed at 08/08/2017 1530 Gross per 24 hour  Intake 900 ml  Output 75 ml  Net 825 ml   Filed Weights   08/06/17 1622  Weight: 73 kg (161 lb)    Examination:  General exam: Appears calm and comfortable ,Not in distress,average built.  On nasal cannula oxygen.  Hard of hearing HEENT:PERRL,Oral mucosa moist, Ear/Nose normal on gross exam Respiratory system: Bilateral equal air entry, normal vesicular breath sounds, no wheezes or crackles  Cardiovascular system: S1 & S2 heard, irrregular rhythm no JVD, murmurs, rubs, gallops or clicks. No pedal edema. Gastrointestinal system: Abdomen is nondistended, soft and nontender. No organomegaly or masses felt. Normal bowel sounds heard. Central nervous system: Alert and oriented. No focal neurological deficits. Extremities: No edema, no clubbing ,no cyanosis, Left thigh with edema status post surgical dressing in place.  Distal pulses are palpable Skin: No rashes, lesions or ulcers,no icterus ,no pallor MSK: Normal muscle bulk,tone ,power Psychiatry: Judgement and insight appear normal. Mood & affect appropriate.   Data Reviewed: I have personally reviewed following  labs and imaging studies  CBC: Recent Labs  Lab 08/06/17 1630 08/07/17 0845 08/08/17 0001 08/08/17 1605 08/08/17 1624 08/09/17 0639  WBC 10.9* 9.3 9.5  --   --  10.5  NEUTROABS  --   --  7.8*  --   --  8.6*  HGB 9.4* 7.1* 9.8* 10.2* 9.5* 8.2*  HCT  29.6* 22.2* 29.9* 30.0* 28.0* 23.9*  MCV 86.0 85.4 83.8  --   --  84.5  PLT 270 236 187  --   --  585   Basic Metabolic Panel: Recent Labs  Lab 08/06/17 1630 08/07/17 0845 08/08/17 0001 08/08/17 1605 08/08/17 1624 08/09/17 0639  NA 139 136 136 137 137 137  K 3.5 3.9 3.8 3.6 3.6 4.0  CL 104 105 104  --   --  101  CO2 25 23 22   --   --  23  GLUCOSE 175* 124* 135*  --   --  147*  BUN 39* 31* 22*  --   --  18  CREATININE 1.55* 1.27* 1.12*  --   --  1.16*  CALCIUM 9.1 8.2* 8.5*  --   --  7.7*   GFR: Estimated Creatinine Clearance: 37 mL/min (A) (by C-G formula based on SCr of 1.16 mg/dL (H)). Liver Function Tests: Recent Labs  Lab 08/06/17 1630 08/07/17 0845  AST 20  --   ALT 17  --   ALKPHOS 72  --   BILITOT 0.4  --   PROT 6.6  --   ALBUMIN 3.7 3.1*   No results for input(s): LIPASE, AMYLASE in the last 168 hours. No results for input(s): AMMONIA in the last 168 hours. Coagulation Profile: Recent Labs  Lab 08/06/17 1630  INR 1.66   Cardiac Enzymes: Recent Labs  Lab 08/07/17 0006 08/07/17 0845 08/07/17 1320 08/08/17 0001 08/08/17 0553  TROPONINI <0.03 0.03* 0.03* 0.03* 0.03*   BNP (last 3 results) No results for input(s): PROBNP in the last 8760 hours. HbA1C: No results for input(s): HGBA1C in the last 72 hours. CBG: No results for input(s): GLUCAP in the last 168 hours. Lipid Profile: No results for input(s): CHOL, HDL, LDLCALC, TRIG, CHOLHDL, LDLDIRECT in the last 72 hours. Thyroid Function Tests: No results for input(s): TSH, T4TOTAL, FREET4, T3FREE, THYROIDAB in the last 72 hours. Anemia Panel: Recent Labs    08/07/17 0006  VITAMINB12 620  FOLATE 38.4  FERRITIN 123  TIBC 238*  IRON 19*  RETICCTPCT 1.2   Sepsis Labs: No results for input(s): PROCALCITON, LATICACIDVEN in the last 168 hours.  Recent Results (from the past 240 hour(s))  Urine Culture     Status: Abnormal   Collection Time: 08/07/17  4:31 AM  Result Value Ref Range Status    Specimen Description URINE, CLEAN CATCH  Final   Special Requests   Final    NONE Performed at [facility], [address], [place], [place] [address]    Culture MULTIPLE SPECIES PRESENT, SUGGEST RECOLLECTION (A)  Final   Report Status 08/08/2017 FINAL  Final  Surgical pcr screen     Status: None   Collection Time: 08/08/17 12:11 AM  Result Value Ref Range Status   MRSA, PCR NEGATIVE NEGATIVE Final   Staphylococcus aureus NEGATIVE NEGATIVE Final    Comment: (NOTE) The Xpert SA Assay (FDA approved for NASAL specimens in patients 81 years of age and older), is one component of a comprehensive surveillance program. It is not intended to diagnose infection nor to guide or  monitor treatment. Performed at [facility], [place] [address], [place], [place] [address]          Radiology Studies: Dg C-arm 1-60 Min  Result Date: 08/08/2017 CLINICAL DATA:  Intraoperative imaging for fixation of a periprosthetic left knee fracture suffered in a fall 08/06/2017. EXAM: DG C-ARM 61-120 MIN; LEFT FEMUR 2 VIEWS COMPARISON:  CT and plain films left knee 08/06/2017. FINDINGS: Six fluoroscopic intraoperative spot views of the left knee are provided. Images demonstrate placement lateral plate and screws for fixation of a periprosthetic distal femur fracture. Position and alignment are improved. No new abnormality. IMPRESSION: Intraoperative imaging for fixation of a periprosthetic distal left femur fracture. No acute finding. Electronically Signed   By: Inge Rise M.D.   On: 08/08/2017 15:00   Dg Femur Min 2 Views Left  Result Date: 08/08/2017 CLINICAL DATA:  Intraoperative imaging for fixation of a periprosthetic left knee fracture suffered in a fall 08/06/2017. EXAM: DG C-ARM 61-120 MIN; LEFT FEMUR 2 VIEWS COMPARISON:  CT and plain films left knee 08/06/2017. FINDINGS: Six fluoroscopic intraoperative spot views of the left knee are provided. Images demonstrate placement lateral plate and  screws for fixation of a periprosthetic distal femur fracture. Position and alignment are improved. No new abnormality. IMPRESSION: Intraoperative imaging for fixation of a periprosthetic distal left femur fracture. No acute finding. Electronically Signed   By: Inge Rise M.D.   On: 08/08/2017 15:00   Dg Femur Port Min 2 Views Left  Result Date: 08/08/2017 CLINICAL DATA:  Postoperative fixation for femur fracture EXAM: LEFT FEMUR PORTABLE 2 VIEWS COMPARISON:  Preoperative images Aug 06, 2017; intraoperative study Aug 08, 2017 FINDINGS: Frontal and lateral views were obtained. There is screw and plate fixation through a comminuted fracture of the distal femoral diaphysis. There remains moderate displacement of fracture fragments slightly proximal to the distal screws with lateral and posterior displacement of the distal major fracture fragment with respect to the proximal major fragment. Screw and plate fixation traverses this area with alignment essentially anatomic between the proximal and distal aspects of the femur. There is a total knee replacement with prosthetic components well-seated. No dislocation. IMPRESSION: Surgical fixation through a comminuted fracture of the distal femur with overall alignment near anatomic, although there is displacement of fracture fragments slightly proximal to the distal screws. Total knee replacement with prosthetic components well-seated. No new fracture. No dislocation. Electronically Signed   By: Lowella Grip III M.D.   On: 08/08/2017 16:20        Scheduled Meds: . atorvastatin  20 mg Oral QPC supper  . famotidine  10 mg Oral Daily  . metoprolol succinate  25 mg Oral BID  . pantoprazole  40 mg Oral Daily  . saccharomyces boulardii  250 mg Oral BID   Continuous Infusions: . sodium chloride    .  ceFAZolin (ANCEF) IV Stopped (08/09/17 0640)  . cefTRIAXone (ROCEPHIN) IVPB 1 gram/100 mL NS (Mini-Bag Plus) Stopped (08/08/17 2343)  . lactated ringers  10 mL/hr at 08/08/17 1147  . methocarbamol (ROBAXIN)  IV       LOS: 3 days    Time spent: More than 50% of that time was spent in counseling and/or coordination of care.  Flora Lipps, MD [facility] Pager [phone]  If 7PM-7AM, please contact night-coverage [email or web address] Password [email or web address] 08/09/2017, 12:30 PM

## 2017-08-09 NOTE — Evaluation (Signed)
Occupational Therapy Evaluation Patient Details Name: [name] MRN: [id number] DOB: 08-25-1933 Today's Date: 08/09/2017    History of Present Illness 84 y.o. female with medical history significant of CVA   residual right-sided facial droop secondary to mastoiditis as a child, a.fib on coumadin, breast cancer, GERD, HTN, HLD to the hospital after a fall she was noted to have left periprosthetic hip fracture.  Patient underwent open reduction and internal fixation by orthopedics after admission.    Clinical Impression   Pt admitted with fall and hip fx. Pt currently with functional limitations due to the deficits listed below (see OT Problem List).  Pt will benefit from skilled OT to increase their safety and independence with ADL and functional mobility for ADL to facilitate discharge to venue listed below.      Follow Up Recommendations  SNF    Equipment Recommendations  None recommended by OT    Recommendations for Other Services       Precautions / Restrictions Restrictions Weight Bearing Restrictions: Yes LLE Weight Bearing: Weight bearing as tolerated      Mobility Bed Mobility Overal bed mobility: Needs Assistance Bed Mobility: Supine to Sit;Sit to Supine     Supine to sit: +2 for physical assistance;+2 for safety/equipment;Max assist Sit to supine: +2 for safety/equipment;+2 for physical assistance;Max assist      Transfers Overall transfer level: Needs assistance Equipment used: Rolling walker (2 wheeled) Transfers: Sit to/from Stand Sit to Stand: +2 safety/equipment;Max assist         General transfer comment: MAX VC for UE and LE placement as well as encouragement    Balance Overall balance assessment: Needs assistance;History of Falls Sitting-balance support: Feet supported Sitting balance-Leahy Scale: Fair     Standing balance support: Bilateral upper extremity supported Standing balance-Leahy Scale: Zero                              ADL either performed or assessed with clinical judgement   ADL Overall ADL's : Needs assistance/impaired Eating/Feeding: Minimal assistance;Bed level   Grooming: Minimal assistance;Bed level               Lower Body Dressing: +2 for physical assistance;Cueing for safety;Cueing for sequencing;Maximal assistance;Sit to/from stand       Toileting- Water quality scientist and Hygiene: +2 for physical assistance;Total assistance;Sit to/from stand;Cueing for sequencing;Cueing for safety               Vision Patient Visual Report: No change from baseline              Pertinent Vitals/Pain Pain Assessment: 0-10 Pain Score: 6  Pain Location: R hip Pain Descriptors / Indicators: Discomfort;Dull Pain Intervention(s): Limited activity within patient's tolerance;Repositioned;Monitored during session     Hand Dominance     Extremity/Trunk Assessment Upper Extremity Assessment Upper Extremity Assessment: Generalized weakness(pt with hx of lymphedema LUE)           Communication     Cognition Arousal/Alertness: Awake/alert Behavior During Therapy: Anxious;WFL for tasks assessed/performed Overall Cognitive Status: Within Functional Limits for tasks assessed                                                Home Living Family/patient expects to be discharged to:: Skilled nursing facility  OT Problem List: Decreased strength;Decreased activity tolerance;Decreased knowledge of use of DME or AE;Impaired balance (sitting and/or standing);Obesity      OT Treatment/Interventions: Self-care/ADL training;Patient/family education;DME and/or AE instruction    OT Goals(Current goals can be found in the care plan section) Acute Rehab OT Goals Patient Stated Goal: get well OT Goal Formulation: With patient Time For Goal Achievement: 08/23/17 ADL Goals Pt Will Perform Eating: with  supervision;sitting Pt Will Perform Grooming: with set-up Pt Will Transfer to Toilet: with mod assist;stand pivot transfer;bedside commode;with +2 assist Pt Will Perform Toileting - Clothing Manipulation and hygiene: with 2+ total assist;with mod assist;sit to/from stand  OT Frequency: Min 2X/week   Barriers to D/C: Decreased caregiver support  husband not able to provide amount of A pt needs at this time          AM-PAC PT "6 Clicks" Daily Activity     Outcome Measure Help from another person eating meals?: A Little Help from another person taking care of personal grooming?: A Little Help from another person toileting, which includes using toliet, bedpan, or urinal?: Total Help from another person bathing (including washing, rinsing, drying)?: A Lot Help from another person to put on and taking off regular upper body clothing?: A Lot Help from another person to put on and taking off regular lower body clothing?: Total 6 Click Score: 12   End of Session Nurse Communication: Mobility status  Activity Tolerance: Patient limited by fatigue;Patient limited by pain Patient left: in bed;with call bell/phone within reach;with nursing/sitter in room;with family/visitor present  OT Visit Diagnosis: Unsteadiness on feet (R26.81);History of falling (Z91.81);Other abnormalities of gait and mobility (R26.89);Repeated falls (R29.6);Pain Pain - part of body: Hip                Time: 6063-0160 OT Time Calculation (min): 23 min Charges:  OT General Charges $OT Visit: 1 Visit OT Evaluation $OT Eval Moderate Complexity: 1 Mod G-Codes:     Kari Baars, [place]  Payton Mccallum D 08/09/2017, 1:42 PM

## 2017-08-09 NOTE — Clinical Social Work Note (Signed)
Clinical Social Work Assessment  Patient Details  Name: [name] MRN: [id number] Date of Birth: 12-Jan-1934  Date of referral:  08/09/17               Reason for consult:  Facility Placement                Permission sought to share information with:  Chartered certified accountant granted to share information::  Yes, Verbal Permission Granted  Name::     Physiological scientist::  SNF  Relationship::  spouse  Contact Information:     Housing/Transportation Living arrangements for the past 2 months:  [place] of Information:  Patient Patient Interpreter Needed:  None Criminal Activity/Legal Involvement Pertinent to Current Situation/Hospitalization:  No - Comment as needed Significant Relationships:  None Lives with:  Spouse Do you feel safe going back to the place where you live?  No Need for family participation in patient care:  Yes (Comment)  Care giving concerns:  Pt from home with spouse and will need skilled nursing at discharge.  Social Worker assessment / plan:  CSW met with patient and spouse at bedside. Per spouse, patient was independent with ADL's and ambulating prior to hospitalization. Pt has experience with SNF and desires [facility]. CSW validated and indicated that PT is pending. CSW obtained permission to send to [facility] despite wanting [facility]. CSW explained role and SNF process. Patient/spouse agreeable.  CSW will f/u for disposition.  Employment status:  Retired Forensic scientist:  Medicare PT Recommendations:  [place] / Referral to community resources:  [place]  [name] Response to care:  Patient/family thanked CSW for meeting to discuss disposition and are agreeable to SNF.  [name] Understanding of and Emotional Response to Diagnosis, Current Treatment, and Prognosis:  Patient/spouse has good understanding of impairment and agreeable to placement. Pt  appears to be in good emotional state and accepting of disposition. Pt desires to return home with spouse once rehabilitative therapies are complete. CSW will assist with disposition.  Emotional Assessment Appearance:  Appears stated age Attitude/Demeanor/Rapport:    Affect (typically observed):  (Cooperative) Orientation:  Oriented to Self, Oriented to Place, Oriented to  Time, Oriented to Situation Alcohol / Substance use:  Not Applicable Psych involvement (Current and /or in the community):  No (Comment)  Discharge Needs  Concerns to be addressed:  Discharge Planning Concerns Readmission within the last 30 days:  No Current discharge risk:  Dependent with Mobility, Physical Impairment Barriers to Discharge:  No Barriers Identified   Carrie Baxter, LCSW 08/09/2017, 12:44 PM

## 2017-08-09 NOTE — Progress Notes (Signed)
Orthopaedic Trauma Progress Note  S: Doing well, pain controlled when seen this AM  O:  Vitals:   08/08/17 2104 08/09/17 0405  BP: (!) 180/78 (!) 186/83  Pulse: 90 80  Resp: 16 16  Temp: 98 F (36.7 C) 97.9 F (36.6 C)  SpO2: 100% 100%   LLE: Dressing clean dry and intact, neuro intact at baseline  Imaging: Stable postoperative imaging  Labs:  CBC    Component Value Date/Time   WBC 10.5 08/09/2017 0639   RBC 2.83 (L) 08/09/2017 0639   HGB 8.2 (L) 08/09/2017 0639   HCT 23.9 (L) 08/09/2017 0639   PLT 208 08/09/2017 0639   MCV 84.5 08/09/2017 0639   MCH 29.0 08/09/2017 0639   MCHC 34.3 08/09/2017 0639   RDW 14.7 08/09/2017 0639   LYMPHSABS 0.7 08/09/2017 0639   MONOABS 1.2 (H) 08/09/2017 0639   EOSABS 0.0 08/09/2017 0639   BASOSABS 0.0 08/09/2017 06333    A/P: 84 year old female s/p ORIF of left periprosthetic distal femur fracture  Weightbearing: WBAT LLE Insicional and dressing care: OK to remove dressings POD2/3 and leave open to air with dry gauze PRN Orthopedic device(s): None Showering: Not yet VTE prophylaxis: Okay to restart coumadin today, will defer bridging to hospitalist, her hemoglobin is 8.2 this AM so would be higher risk to need transufsion Pain control: Norco and robaxin Follow - up plan: 2 weeks  Shona Needles, MD [facility] [phone] (phone)

## 2017-08-09 NOTE — Evaluation (Signed)
Physical Therapy Evaluation Patient Details Name: [name] MRN: [id number] DOB: 21-Jun-1933 Today's Date: 08/09/2017   History of Present Illness  84 y.o. female with medical history significant of CVA   residual right-sided facial droop secondary to mastoiditis as a child, a.fib on coumadin, breast cancer, GERD, HTN, HLD to the hospital after a fall she was noted to have left periprosthetic hip fracture.  Patient underwent open reduction and internal fixation by orthopedics after admission.   Clinical Impression  Pt admitted with/for L hip fx, s/p ORIF.  Pt now needing significant assist for basic mobility.  Pt currently limited functionally due to the problems listed. ( See problems list.)   Pt will benefit from PT to maximize function and safety in order to get ready for next venue listed below.     Follow Up Recommendations SNF    Equipment Recommendations  Other (comment)(TBA)    Recommendations for Other Services       Precautions / Restrictions Restrictions Weight Bearing Restrictions: Yes LLE Weight Bearing: Weight bearing as tolerated      Mobility  Bed Mobility         Supine to sit: Max assist;+2 for physical assistance Sit to supine: +2 for physical assistance;Total assist   General bed mobility comments: pt assisted in bridging to EOB, and came up via R elbow and significant assist.  Transfers                 General transfer comment: NT this evening  Ambulation/Gait                Stairs            Wheelchair Mobility    Modified Rankin (Stroke Patients Only)       Balance Overall balance assessment: Needs assistance;History of Falls Sitting-balance support: Feet supported Sitting balance-Leahy Scale: Fair                                       Pertinent Vitals/Pain Pain Assessment: Faces Faces Pain Scale: Hurts even more Pain Location: R hip Pain Descriptors / Indicators: Discomfort;Dull Pain  Intervention(s): Monitored during session;Limited activity within patient's tolerance    Home Living Family/patient expects to be discharged to:: Skilled nursing facility                      Prior Function Level of Independence: Independent with assistive device(s)               Hand Dominance        Extremity/Trunk Assessment   Upper Extremity Assessment Upper Extremity Assessment: Defer to OT evaluation;RUE deficits/detail;LUE deficits/detail    Lower Extremity Assessment Lower Extremity Assessment: RLE deficits/detail;LLE deficits/detail RLE Deficits / Details: Stiff and guarded grossly 3+/5 LLE Deficits / Details: AAROM to knee flexion 20* in supine and 70* sitting, hip flexion in supine 20* and 90* in sitting. LLE: Unable to fully assess due to pain LLE Coordination: decreased fine motor;decreased gross motor       Communication   Communication: No difficulties  Cognition Arousal/Alertness: Awake/alert Behavior During Therapy: Anxious;WFL for tasks assessed/performed Overall Cognitive Status: Within Functional Limits for tasks assessed  General Comments      Exercises     Assessment/Plan    PT Assessment Patient needs continued PT services  PT Problem List Decreased strength;Decreased range of motion;Decreased activity tolerance;Decreased mobility;Decreased knowledge of use of DME;Decreased knowledge of precautions;Pain       PT Treatment Interventions      PT Goals (Current goals can be found in the Care Plan section)  Acute Rehab PT Goals Patient Stated Goal: get well PT Goal Formulation: With patient Time For Goal Achievement: 08/23/17 Potential to Achieve Goals: Good    Frequency Min 3X/week   Barriers to discharge        Co-evaluation               AM-PAC PT "6 Clicks" Daily Activity  Outcome Measure Difficulty turning over in bed (including adjusting bedclothes,  sheets and blankets)?: Unable Difficulty moving from lying on back to sitting on the side of the bed? : Unable Difficulty sitting down on and standing up from a chair with arms (e.g., wheelchair, bedside commode, etc,.)?: Unable Help needed moving to and from a bed to chair (including a wheelchair)?: Total Help needed walking in hospital room?: Total Help needed climbing 3-5 steps with a railing? : Total 6 Click Score: 6    End of Session   Activity Tolerance: Patient limited by pain Patient left: in bed;with call bell/phone within reach;with bed alarm set;with nursing/sitter in room;with family/visitor present Nurse Communication: Mobility status PT Visit Diagnosis: Other abnormalities of gait and mobility (R26.89);Pain Pain - Right/Left: Left Pain - part of body: (hip, leg, knee)    Time: 2778-2423 PT Time Calculation (min) (ACUTE ONLY): 32 min   Charges:   PT Evaluation $PT Eval Moderate Complexity: 1 Mod PT Treatments $Therapeutic Activity: 8-22 mins   PT G Codes:        Sep 07, 2017  Donnella Sham, PT [phone]  (pager)  Tessie Fass [place] Sep 07, 2017, 6:53 PM

## 2017-08-09 NOTE — Progress Notes (Signed)
PT Cancellation Note  Patient Details Name: [name] MRN: [id number] DOB: 1933/07/09   Cancelled Treatment:    Reason Eval/Treat Not Completed: Fatigue/lethargy limiting ability to participate.  Pt sleeping soundly when PT arrived. Unable to keep eyes open or complete answering questions.  Will f/u as able.    Michel Santee 08/09/2017, 10:28 AM

## 2017-08-10 DIAGNOSIS — S72402A Unspecified fracture of lower end of left femur, initial encounter for closed fracture: Secondary | ICD-10-CM

## 2017-08-10 DIAGNOSIS — I10 Essential (primary) hypertension: Secondary | ICD-10-CM

## 2017-08-10 DIAGNOSIS — N390 Urinary tract infection, site not specified: Secondary | ICD-10-CM

## 2017-08-10 LAB — CBC
HCT: 20.8 % — ABNORMAL LOW (ref 36.0–46.0)
HCT: 24.5 % — ABNORMAL LOW (ref 36.0–46.0)
Hemoglobin: 6.7 g/dL — CL (ref 12.0–15.0)
Hemoglobin: 8 g/dL — ABNORMAL LOW (ref 12.0–15.0)
MCH: 27.8 pg (ref 26.0–34.0)
MCH: 28.2 pg (ref 26.0–34.0)
MCHC: 32.2 g/dL (ref 30.0–36.0)
MCHC: 32.7 g/dL (ref 30.0–36.0)
MCV: 86.3 fL (ref 78.0–100.0)
MCV: 86.3 fL (ref 78.0–100.0)
Platelets: 175 10*3/uL (ref 150–400)
Platelets: 197 10*3/uL (ref 150–400)
RBC: 2.41 MIL/uL — ABNORMAL LOW (ref 3.87–5.11)
RBC: 2.84 MIL/uL — ABNORMAL LOW (ref 3.87–5.11)
RDW: 14.1 % (ref 11.5–15.5)
RDW: 14.1 % (ref 11.5–15.5)
WBC: 8.1 10*3/uL (ref 4.0–10.5)
WBC: 8.6 10*3/uL (ref 4.0–10.5)

## 2017-08-10 LAB — BASIC METABOLIC PANEL
Anion gap: 7 (ref 5–15)
BUN: 23 mg/dL — ABNORMAL HIGH (ref 6–20)
CO2: 26 mmol/L (ref 22–32)
Calcium: 7.5 mg/dL — ABNORMAL LOW (ref 8.9–10.3)
Chloride: 102 mmol/L (ref 101–111)
Creatinine, Ser: 1.31 mg/dL — ABNORMAL HIGH (ref 0.44–1.00)
GFR calc Af Amer: 43 mL/min — ABNORMAL LOW (ref >60)
GFR calc non Af Amer: 37 mL/min — ABNORMAL LOW (ref >60)
Glucose, Bld: 137 mg/dL — ABNORMAL HIGH (ref 65–99)
Potassium: 3.3 mmol/L — ABNORMAL LOW (ref 3.5–5.1)
Sodium: 135 mmol/L (ref 135–145)

## 2017-08-10 LAB — PREPARE RBC (CROSSMATCH)

## 2017-08-10 LAB — PROTIME-INR
INR: 3
Prothrombin Time: 30.9 seconds — ABNORMAL HIGH (ref 11.4–15.2)

## 2017-08-10 MED ORDER — SODIUM CHLORIDE 0.9 % IV SOLN
1.0000 g | INTRAVENOUS | Status: AC
  Administered 2017-08-10: 1 g via INTRAVENOUS
  Filled 2017-08-10: qty 10

## 2017-08-10 MED ORDER — WARFARIN SODIUM 2.5 MG PO TABS
2.5000 mg | ORAL_TABLET | Freq: Once | ORAL | Status: AC
  Administered 2017-08-10: 2.5 mg via ORAL
  Filled 2017-08-10: qty 1

## 2017-08-10 MED ORDER — POTASSIUM CHLORIDE CRYS ER 20 MEQ PO TBCR: 20.0000 meq | EXTENDED_RELEASE_TABLET | Freq: Once | ORAL | Status: DC

## 2017-08-10 MED ORDER — PSYLLIUM 95 % PO PACK
1.0000 | PACK | Freq: Every day | ORAL | Status: DC
  Administered 2017-08-10: 1 via ORAL
  Filled 2017-08-10 (×2): qty 1

## 2017-08-10 MED ORDER — WARFARIN - PHARMACIST DOSING INPATIENT: Freq: Every day | Status: DC

## 2017-08-10 MED ORDER — POLYETHYLENE GLYCOL 3350 17 G PO PACK
17.0000 g | PACK | Freq: Two times a day (BID) | ORAL | Status: DC
  Administered 2017-08-10 (×2): 17 g via ORAL
  Filled 2017-08-10 (×3): qty 1

## 2017-08-10 MED ORDER — SODIUM CHLORIDE 0.9 % IV SOLN
Freq: Once | INTRAVENOUS | Status: AC
  Administered 2017-08-10: 07:00:00 via INTRAVENOUS

## 2017-08-10 NOTE — NC FL2 (Signed)
[place] MEDICAID FL2 LEVEL OF CARE SCREENING TOOL     IDENTIFICATION  Patient Name: [name] Birthdate: 07-21-1933 Sex: female Admission Date (Current Location): 08/06/2017  [facility] and [place] Number:  Herbalist and Address:  [facility] [place] [facility], [place] [address], [place], [place] [address]      Provider Number: [id number]  Attending Physician Name and Address:  Caren Griffins, MD  Relative Name and Phone Number:  Modell Fendrick, spouse, [phone]    Current Level of Care: Hospital Recommended Level of Care: [place] Prior Approval Number:    Date Approved/Denied:   PASRR Number: [id number]  Discharge Plan: SNF    Current Diagnoses: Patient Active Problem List   Diagnosis Date Noted  . Lightheadedness 08/06/2017  . Femur fracture, left ([place]) 08/06/2017  . Dehydration 08/06/2017  . Acute lower UTI 08/06/2017  . Periprosthetic fracture of shaft of femur   . Anemia 11/10/2012    Class: Chronic  . Syncope 11/09/2012  . Urinary tract infection, recurrent 11/09/2012  . Hyponatremia 11/09/2012  . Diarrhea 11/09/2012  . Paroxysmal atrial fibrillation ([place]) 11/09/2012  . Hypertension 11/09/2012  . Synovitis of knee 04/07/2012  . Postop Hypokalemia 09/23/2011  . Postop Acute blood loss anemia 09/21/2011  . Postop Transfusion 09/21/2011  . OA (osteoarthritis) of knee 09/20/2011    Orientation RESPIRATION BLADDER Height & Weight     Self, Time, Situation, Place  O2 Incontinent Weight: 161 lb (73 kg) Height:  5' 4.5" (163.8 cm)  BEHAVIORAL SYMPTOMS/MOOD NEUROLOGICAL BOWEL NUTRITION STATUS      Continent Diet  AMBULATORY STATUS COMMUNICATION OF NEEDS Skin   Extensive Assist Verbally Surgical wounds, Wound Vac                       Personal Care Assistance Level of Assistance  Bathing, Feeding, Dressing Bathing Assistance: Maximum assistance Feeding assistance: Limited assistance Dressing Assistance: Maximum  assistance     Functional Limitations Info  Sight, Hearing, Speech Sight Info: Adequate Hearing Info: Adequate Speech Info: Adequate    SPECIAL CARE FACTORS FREQUENCY  PT (By licensed PT), OT (By licensed OT)     PT Frequency: 5x/wk OT Frequency: 5x/wk            Contractures Contractures Info: Not present    Additional Factors Info  Code Status, Allergies Code Status Info: Full  Allergies Info: SULFAMETHOXAZOLE, AMPICILLIN, CLINDAMYCIN/LINCOMYCIN, LATEX, PRADAXA DABIGATRAN ETEXILATE MESYLATE, CIPROFLOXACIN            Current Medications (08/10/2017):  This is the current hospital active medication list Current Facility-Administered Medications  Medication Dose Route Frequency Provider Last Rate Last Dose  . atorvastatin (LIPITOR) tablet 20 mg  20 mg Oral QPC supper Haddix, Thomasene Lot, MD   20 mg at 08/09/17 1739  . cefTRIAXone (ROCEPHIN) 1 g in sodium chloride 0.9 % 100 mL IVPB  1 g Intravenous Q24H Haddix, Thomasene Lot, MD   Stopped at 08/09/17 2335  . famotidine (PEPCID) tablet 10 mg  10 mg Oral Daily Haddix, Thomasene Lot, MD   10 mg at 08/10/17 0955  . HYDROcodone-acetaminophen (NORCO/VICODIN) 5-325 MG per tablet 1-2 tablet  1-2 tablet Oral Q6H PRN Haddix, Thomasene Lot, MD   1 tablet at 08/10/17 1028  . lactated ringers infusion   Intravenous Continuous Haddix, Thomasene Lot, MD 10 mL/hr at 08/08/17 1147    . loperamide (IMODIUM) capsule 2-4 mg  2-4 mg Oral 5 X Daily PRN Pokhrel, Laxman,  MD      . losartan (COZAAR) tablet 100 mg  100 mg Oral Daily Pokhrel, Laxman, MD   100 mg at 08/10/17 0955  . methocarbamol (ROBAXIN) tablet 500 mg  500 mg Oral Q6H PRN Haddix, Thomasene Lot, MD   500 mg at 08/09/17 2304   Or  . methocarbamol (ROBAXIN) 500 mg in dextrose 5 % 50 mL IVPB  500 mg Intravenous Q6H PRN Haddix, Thomasene Lot, MD      . metoprolol succinate (TOPROL-XL) 24 hr tablet 25 mg  25 mg Oral BID Haddix, Thomasene Lot, MD   25 mg at 08/10/17 0955  . mirabegron ER (MYRBETRIQ) tablet 25 mg  25 mg Oral QPM  Pokhrel, Laxman, MD   25 mg at 08/09/17 1739  . morphine 4 MG/ML injection 4 mg  4 mg Intravenous Q3H PRN Haddix, Thomasene Lot, MD   4 mg at 08/08/17 1942  . pantoprazole (PROTONIX) EC tablet 40 mg  40 mg Oral Daily Haddix, Thomasene Lot, MD   40 mg at 08/10/17 0955  . polyethylene glycol (MIRALAX / GLYCOLAX) packet 17 g  17 g Oral BID Caren Griffins, MD   17 g at 08/10/17 1028  . potassium chloride SA (K-DUR,KLOR-CON) CR tablet 20 mEq  20 mEq Oral Once Kirby-Graham, Karsten Fells, NP      . psyllium (HYDROCIL/METAMUCIL) packet 1 packet  1 packet Oral Daily Gherghe, Vella Redhead, MD      . saccharomyces boulardii (FLORASTOR) capsule 250 mg  250 mg Oral BID Haddix, Thomasene Lot, MD   250 mg at 08/10/17 0955  . warfarin (COUMADIN) tablet 5 mg  5 mg Oral q1800 Pokhrel, Laxman, MD   5 mg at 08/09/17 1739  . Warfarin - Physician Dosing Inpatient   Does not apply q1800 Pokhrel, Laxman, MD         Discharge Medications: Please see discharge summary for a list of discharge medications.  Relevant Imaging Results:  Relevant Lab Results:   Additional Information SS#: [id number]  [place], [place]

## 2017-08-10 NOTE — Care Management Important Message (Signed)
Important Message  Patient Details  Name: [name] MRN: [id number] Date of Birth: [birth year]   Medicare Important Message Given:  Yes    [name] 08/10/2017, 1:03 PM

## 2017-08-10 NOTE — Discharge Instructions (Addendum)
Orthopaedic Trauma Service Discharge Instructions   General Discharge Instructions  WEIGHT BEARING STATUS:Weight bearing as tolerated right left leg  RANGE OF MOTION/ACTIVITY: No restrictions  Wound Care: Please see instructions below  DVT/PE prophylaxis: Coumadin  Diet: as you were eating previously.  Can use over the counter stool softeners and bowel preparations, such as Miralax, to help with bowel movements.  Narcotics can be constipating.  Be sure to drink plenty of fluids  PAIN MEDICATION USE AND EXPECTATIONS  You have likely been given narcotic medications to help control your pain.  After a traumatic event that results in an fracture (broken bone) with or without surgery, it is ok to use narcotic pain medications to help control one's pain.  We understand that everyone responds to pain differently and each individual patient will be evaluated on a regular basis for the continued need for narcotic medications. Ideally, narcotic medication use should last no more than 6-8 weeks (coinciding with fracture healing).   As a patient it is your responsibility as well to monitor narcotic medication use and report the amount and frequency you use these medications when you come to your office visit.   We would also advise that if you are using narcotic medications, you should take a dose prior to therapy to maximize you participation.  IF YOU ARE ON NARCOTIC MEDICATIONS IT IS NOT PERMISSIBLE TO OPERATE A MOTOR VEHICLE (MOTORCYCLE/CAR/TRUCK/MOPED) OR HEAVY MACHINERY DO NOT MIX NARCOTICS WITH OTHER CNS (CENTRAL NERVOUS SYSTEM) DEPRESSANTS SUCH AS ALCOHOL   STOP SMOKING OR USING NICOTINE PRODUCTS!!!!  As discussed nicotine severely impairs your body's ability to heal surgical and traumatic wounds but also impairs bone healing.  Wounds and bone heal by forming microscopic blood vessels (angiogenesis) and nicotine is a vasoconstrictor (essentially, shrinks blood vessels).  Therefore, if  vasoconstriction occurs to these microscopic blood vessels they essentially disappear and are unable to deliver necessary nutrients to the healing tissue.  This is one modifiable factor that you can do to dramatically increase your chances of healing your injury.    (This means no smoking, no nicotine gum, patches, etc)  DO NOT USE NONSTEROIDAL ANTI-INFLAMMATORY DRUGS (NSAID'S)  Using products such as Advil (ibuprofen), Aleve (naproxen), Motrin (ibuprofen) for additional pain control during fracture healing can delay and/or prevent the healing response.  If you would like to take over the counter (OTC) medication, Tylenol (acetaminophen) is ok.  However, some narcotic medications that are given for pain control contain acetaminophen as well. Therefore, you should not exceed more than 4000 mg of tylenol in a day if you do not have liver disease.  Also note that there are may OTC medicines, such as cold medicines and allergy medicines that my contain tylenol as well.  If you have any questions about medications and/or interactions please ask your doctor/PA or your pharmacist.      ICE AND ELEVATE INJURED/OPERATIVE EXTREMITY  Using ice and elevating the injured extremity above your heart can help with swelling and pain control.  Icing in a pulsatile fashion, such as 20 minutes on and 20 minutes off, can be followed.    Do not place ice directly on skin. Make sure there is a barrier between to skin and the ice pack.    Using frozen items such as frozen peas works well as the conform nicely to the are that needs to be iced.  USE AN ACE WRAP OR TED HOSE FOR SWELLING CONTROL  In addition to icing and elevation, Ace wraps or  TED hose are used to help limit and resolve swelling.  It is recommended to use Ace wraps or TED hose until you are informed to stop.    When using Ace Wraps start the wrapping distally (farthest away from the body) and wrap proximally (closer to the body)   Example: If you had surgery on  your leg or thing and you do not have a splint on, start the ace wrap at the toes and work your way up to the thigh        If you had surgery on your upper extremity and do not have a splint on, start the ace wrap at your fingers and work your way up to the upper arm  Orient: [phone]    Discharge Wound Care Instructions  Do NOT apply any ointments, solutions or lotions to pin sites or surgical wounds.  These prevent needed drainage and even though solutions like hydrogen peroxide kill bacteria, they also damage cells lining the pin sites that help fight infection.  Applying lotions or ointments can keep the wounds moist and can cause them to breakdown and open up as well. This can increase the risk for infection. When in doubt call the office.  Surgical incisions should be dressed daily.  If any drainage is noted, use one layer of adaptic, then gauze, Kerlix, and an ace wrap.  Once the incision is completely dry and without drainage, it may be left open to air out.  Showering may begin 36-48 hours later.  Cleaning gently with soap and water.    Information on my medicine - Coumadin   (Warfarin)  This medication education was reviewed with me or my healthcare representative as part of my discharge preparation.  The pharmacist that spoke with me during my hospital stay was:  Saundra Shelling, [facility]  Why was Coumadin prescribed for you? Coumadin was prescribed for you because you have a blood clot or a medical condition that can cause an increased risk of forming blood clots. Blood clots can cause serious health problems by blocking the flow of blood to the heart, lung, or brain. Coumadin can prevent harmful blood clots from forming. As a reminder your indication for Coumadin is:   Stroke Prevention Because Of Atrial Fibrillation  What test will check on my response to Coumadin? While on Coumadin (warfarin) you will need to have an INR test regularly  to ensure that your dose is keeping you in the desired range. The INR (international normalized ratio) number is calculated from the result of the laboratory test called prothrombin time (PT).  If an INR APPOINTMENT HAS NOT ALREADY BEEN MADE FOR YOU please schedule an appointment to have this lab work done by your health care provider within 7 days. Your INR goal is usually a number between:  2 to 3 or your provider may give you a more narrow range like 2-2.5.  Ask your health care provider during an office visit what your goal INR is.  What  do you need to  know  About  COUMADIN? Take Coumadin (warfarin) exactly as prescribed by your healthcare provider about the same time each day.  DO NOT stop taking without talking to the doctor who prescribed the medication.  Stopping without other blood clot prevention medication to take the place of Coumadin may increase your risk of developing a new clot or stroke.  Get refills before you run out.  What do you do if  you miss a dose? If you miss a dose, take it as soon as you remember on the same day then continue your regularly scheduled regimen the next day.  Do not take two doses of Coumadin at the same time.  Important Safety Information A possible side effect of Coumadin (Warfarin) is an increased risk of bleeding. You should call your healthcare provider right away if you experience any of the following: ? Bleeding from an injury or your nose that does not stop. ? Unusual colored urine (red or dark brown) or unusual colored stools (red or black). ? Unusual bruising for unknown reasons. ? A serious fall or if you hit your head (even if there is no bleeding).  Some foods or medicines interact with Coumadin (warfarin) and might alter your response to warfarin. To help avoid this: ? Eat a balanced diet, maintaining a consistent amount of Vitamin K. ? Notify your provider about major diet changes you plan to make. ? Avoid alcohol or limit your intake to  1 drink for women and 2 drinks for men per day. (1 drink is 5 oz. wine, 12 oz. beer, or 1.5 oz. liquor.)  Make sure that ANY health care provider who prescribes medication for you knows that you are taking Coumadin (warfarin).  Also make sure the healthcare provider who is monitoring your Coumadin knows when you have started a new medication including herbals and non-prescription products.  Coumadin (Warfarin)  Major Drug Interactions  Increased Warfarin Effect Decreased Warfarin Effect  Alcohol (large quantities) Antibiotics (esp. Septra/Bactrim, Flagyl, Cipro) Amiodarone (Cordarone) Aspirin (ASA) Cimetidine (Tagamet) Megestrol (Megace) NSAIDs (ibuprofen, naproxen, etc.) Piroxicam (Feldene) Propafenone (Rythmol SR) Propranolol (Inderal) Isoniazid (INH) Posaconazole (Noxafil) Barbiturates (Phenobarbital) Carbamazepine (Tegretol) Chlordiazepoxide (Librium) Cholestyramine (Questran) Griseofulvin Oral Contraceptives Rifampin Sucralfate (Carafate) Vitamin K   Coumadin (Warfarin) Major Herbal Interactions  Increased Warfarin Effect Decreased Warfarin Effect  Garlic Ginseng Ginkgo biloba Coenzyme Q10 Green tea St. Johns wort    Coumadin (Warfarin) FOOD Interactions  Eat a consistent number of servings per week of foods HIGH in Vitamin K (1 serving =  cup)  Collards (cooked, or boiled & drained) Kale (cooked, or boiled & drained) Mustard greens (cooked, or boiled & drained) Parsley *serving size only =  cup Spinach (cooked, or boiled & drained) Swiss chard (cooked, or boiled & drained) Turnip greens (cooked, or boiled & drained)  Eat a consistent number of servings per week of foods MEDIUM-HIGH in Vitamin K (1 serving = 1 cup)  Asparagus (cooked, or boiled & drained) Broccoli (cooked, boiled & drained, or raw & chopped) Brussel sprouts (cooked, or boiled & drained) *serving size only =  cup Lettuce, raw (green leaf, endive, romaine) Spinach, raw Turnip greens, raw  & chopped   These websites have more information on Coumadin (warfarin):  FailFactory.se; VeganReport.com.au;

## 2017-08-10 NOTE — Progress Notes (Addendum)
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 fib on chronic Coumadin, breast cancer, hypertension, hyperlipidemia, who was admitted to the hospital on 5/11 after having a fall and a left periprosthetic hip fracture.  She underwent surgical repair on 5/13.  Assessment & Plan: Principal Problem:   Periprosthetic fracture of shaft of femur Active Problems:   Diarrhea   Paroxysmal atrial fibrillation (HCC)   Hypertension   Anemia   Lightheadedness   Femur fracture, left (HCC)   Dehydration   Acute lower UTI   Periprosthetic hip fracture -Orthopedic surgery consulted and followed patient while hospitalized.  She underwent surgery and is status post open reduction internal fixation on 5/11 by Dr. Doreatha Martin -She is improving, working with physical therapy, social worker consulted for SNF  Acute blood loss anemia -Received 2 units of packed red blood cells on 5/12, hemoglobin improved however again this morning is less than 7, requiring 2 additional units of packed red blood cells -She has no evidence of bleed, suspect postop -Continue to monitor CBC and recheck in the morning, if stable she will be able to go to SNF  Chronic kidney disease stage III -baseline Cr ~1.5, currently at baseline  Diarrhea with volume depletion -On presentation, post IV fluids, now she is complaining of constipation, start MiraLAX  Urinary tract infection -Urine cultures unfortunately without speciatiom, treat for 5 days, tonight will be the last dose  Paroxysmal A. fib, prior CVA -Continue rate control with metoprolol, continue Coumadin  Hypertension -Currently on Cozaar and metoprolol, blood pressure stable, continue  Hyperlipidemia -Continue statin   DVT prophylaxis: On  Coumadin Code Status: Full code Family Communication: Husband present at bedside Disposition Plan: SNF when hemoglobin is stable  Consultants:   Orthopedic surgery  Procedures:   ORIF left periprosthetic hip fracture 5/11  Antimicrobials:  Ceftriaxone 5/11 >> 5/15   Subjective: - no chest pain, shortness of breath, no abdominal pain, nausea or vomiting.  Complains of mild difficulties with swallowing food this morning  Objective: Vitals:   08/10/17 0351 08/10/17 0601 08/10/17 0644 08/10/17 0945  BP: (!) 168/51 (!) 147/59 (!) 114/45 (!) 140/57  Pulse: 84 76 78 72  Resp: 18 18 18 18   Temp: 98.9 F (37.2 C) 98 F (36.7 C) 98.2 F (36.8 C) 99.3 F (37.4 C)  TempSrc: Oral Oral Oral Oral  SpO2: 97%   97%  Weight:      Height:        Intake/Output Summary (Last 24 hours) at 08/10/2017 1121 Last data filed at 08/10/2017 1100 Gross per 24 hour  Intake 1452 ml  Output 900 ml  Net 552 ml   Filed Weights   08/06/17 1622  Weight: 73 kg (161 lb)    Examination:  Constitutional: NAD Eyes: lids and conjunctivae normal Respiratory: clear to auscultation bilaterally, no wheezing, no crackles. Normal respiratory effort. No accessory muscle use.  Cardiovascular: Regular rate and rhythm, no murmurs / rubs / gallops. No LE edema. 2+ pedal pulses.  Abdomen: no tenderness. Bowel sounds positive.  Skin: no rashes, lesions, ulcers. No induration Neurologic: Equal strength, no focal findings   Data Reviewed: I have independently reviewed following labs and imaging studies   CBC: Recent Labs  Lab 08/06/17 1630 08/07/17 0845 08/08/17 0001 08/08/17 1605 08/08/17 1624 08/09/17  1884 08/10/17 0300  WBC 10.9* 9.3 9.5  --   --  10.5 8.1  NEUTROABS  --   --  7.8*  --   --  8.6*  --   HGB 9.4* 7.1* 9.8* 10.2* 9.5* 8.2* 6.7*  HCT 29.6* 22.2* 29.9* 30.0* 28.0* 23.9* 20.8*  MCV 86.0 85.4 83.8  --   --  84.5 86.3  PLT 270 236 187  --   --  208 166   Basic Metabolic Panel: Recent  Labs  Lab 08/06/17 1630 08/07/17 0845 08/08/17 0001 08/08/17 1605 08/08/17 1624 08/09/17 0639 08/10/17 0300  NA 139 136 136 137 137 137 135  K 3.5 3.9 3.8 3.6 3.6 4.0 3.3*  CL 104 105 104  --   --  101 102  CO2 25 23 22   --   --  23 26  GLUCOSE 175* 124* 135*  --   --  147* 137*  BUN 39* 31* 22*  --   --  18 23*  CREATININE 1.55* 1.27* 1.12*  --   --  1.16* 1.31*  CALCIUM 9.1 8.2* 8.5*  --   --  7.7* 7.5*   GFR: Estimated Creatinine Clearance: 32.8 mL/min (A) (by C-G formula based on SCr of 1.31 mg/dL (H)). Liver Function Tests: Recent Labs  Lab 08/06/17 1630 08/07/17 0845  AST 20  --   ALT 17  --   ALKPHOS 72  --   BILITOT 0.4  --   PROT 6.6  --   ALBUMIN 3.7 3.1*   No results for input(s): LIPASE, AMYLASE in the last 168 hours. No results for input(s): AMMONIA in the last 168 hours. Coagulation Profile: Recent Labs  Lab 08/06/17 1630 08/10/17 0300  INR 1.66 3.00   Cardiac Enzymes: Recent Labs  Lab 08/07/17 0006 08/07/17 0845 08/07/17 1320 08/08/17 0001 08/08/17 0553  TROPONINI <0.03 0.03* 0.03* 0.03* 0.03*   BNP (last 3 results) No results for input(s): PROBNP in the last 8760 hours. HbA1C: No results for input(s): HGBA1C in the last 72 hours. CBG: No results for input(s): GLUCAP in the last 168 hours. Lipid Profile: No results for input(s): CHOL, HDL, LDLCALC, TRIG, CHOLHDL, LDLDIRECT in the last 72 hours. Thyroid Function Tests: No results for input(s): TSH, T4TOTAL, FREET4, T3FREE, THYROIDAB in the last 72 hours. Anemia Panel: No results for input(s): VITAMINB12, FOLATE, FERRITIN, TIBC, IRON, RETICCTPCT in the last 72 hours. Urine analysis:    Component Value Date/Time   COLORURINE YELLOW 08/07/2017 0431   APPEARANCEUR HAZY (A) 08/07/2017 0431   LABSPEC 1.012 08/07/2017 0431   PHURINE 9.0 (H) 08/07/2017 0431   GLUCOSEU NEGATIVE 08/07/2017 0431   HGBUR NEGATIVE 08/07/2017 0431   BILIRUBINUR NEGATIVE 08/07/2017 0431   KETONESUR NEGATIVE  08/07/2017 0431   PROTEINUR NEGATIVE 08/07/2017 0431   UROBILINOGEN 0.2 07/28/2014 1239   NITRITE POSITIVE (A) 08/07/2017 0431   LEUKOCYTESUR LARGE (A) 08/07/2017 0431   Sepsis Labs: Invalid input(s): PROCALCITONIN, LACTICIDVEN  Recent Results (from the past 240 hour(s))  Urine Culture     Status: Abnormal   Collection Time: 08/07/17  4:31 AM  Result Value Ref Range Status   Specimen Description URINE, CLEAN CATCH  Final   Special Requests   Final    NONE Performed at [facility], [place] [address] [place], [place] [address]    Culture MULTIPLE SPECIES PRESENT, SUGGEST RECOLLECTION (A)  Final   Report Status 08/08/2017 FINAL  Final  Surgical pcr screen     Status:  None   Collection Time: 08/08/17 12:11 AM  Result Value Ref Range Status   MRSA, PCR NEGATIVE NEGATIVE Final   Staphylococcus aureus NEGATIVE NEGATIVE Final    Comment: (NOTE) The Xpert SA Assay (FDA approved for NASAL specimens in patients 61 years of age and older), is one component of a comprehensive surveillance program. It is not intended to diagnose infection nor to guide or monitor treatment. Performed at [facility], [place] [address], [place], [place] [address]       Radiology Studies: Dg Shoulder Left  Result Date: 08/09/2017 CLINICAL DATA:  Pt c/o left shoulder pain, pt states she fell at home on her tile floor on Thursday and landed on her left side. EXAM: LEFT SHOULDER - 2+ VIEW COMPARISON:  None. FINDINGS: Diffuse osteopenia limits characterization of osseous detail, however, there is no fracture line or displaced fracture fragment seen. LEFT humeral head appears normally positioned relative to the glenoid fossa. Overlying acromioclavicular joint space is normally aligned. Soft tissues about the LEFT shoulder are unremarkable. IMPRESSION: 1. No acute findings. 2. Osteopenia. Electronically Signed   By: Franki Cabot M.D.   On: 08/09/2017 22:57   Dg C-arm 1-60 Min  Result Date:  08/08/2017 CLINICAL DATA:  Intraoperative imaging for fixation of a periprosthetic left knee fracture suffered in a fall 08/06/2017. EXAM: DG C-ARM 61-120 MIN; LEFT FEMUR 2 VIEWS COMPARISON:  CT and plain films left knee 08/06/2017. FINDINGS: Six fluoroscopic intraoperative spot views of the left knee are provided. Images demonstrate placement lateral plate and screws for fixation of a periprosthetic distal femur fracture. Position and alignment are improved. No new abnormality. IMPRESSION: Intraoperative imaging for fixation of a periprosthetic distal left femur fracture. No acute finding. Electronically Signed   By: Inge Rise M.D.   On: 08/08/2017 15:00   Dg Femur Min 2 Views Left  Result Date: 08/08/2017 CLINICAL DATA:  Intraoperative imaging for fixation of a periprosthetic left knee fracture suffered in a fall 08/06/2017. EXAM: DG C-ARM 61-120 MIN; LEFT FEMUR 2 VIEWS COMPARISON:  CT and plain films left knee 08/06/2017. FINDINGS: Six fluoroscopic intraoperative spot views of the left knee are provided. Images demonstrate placement lateral plate and screws for fixation of a periprosthetic distal femur fracture. Position and alignment are improved. No new abnormality. IMPRESSION: Intraoperative imaging for fixation of a periprosthetic distal left femur fracture. No acute finding. Electronically Signed   By: Inge Rise M.D.   On: 08/08/2017 15:00   Dg Femur Port Min 2 Views Left  Result Date: 08/08/2017 CLINICAL DATA:  Postoperative fixation for femur fracture EXAM: LEFT FEMUR PORTABLE 2 VIEWS COMPARISON:  Preoperative images Aug 06, 2017; intraoperative study Aug 08, 2017 FINDINGS: Frontal and lateral views were obtained. There is screw and plate fixation through a comminuted fracture of the distal femoral diaphysis. There remains moderate displacement of fracture fragments slightly proximal to the distal screws with lateral and posterior displacement of the distal major fracture fragment  with respect to the proximal major fragment. Screw and plate fixation traverses this area with alignment essentially anatomic between the proximal and distal aspects of the femur. There is a total knee replacement with prosthetic components well-seated. No dislocation. IMPRESSION: Surgical fixation through a comminuted fracture of the distal femur with overall alignment near anatomic, although there is displacement of fracture fragments slightly proximal to the distal screws. Total knee replacement with prosthetic components well-seated. No new fracture. No dislocation. Electronically Signed   By: Lowella Grip III M.D.  On: 08/08/2017 16:20     Scheduled Meds: . atorvastatin  20 mg Oral QPC supper  . famotidine  10 mg Oral Daily  . losartan  100 mg Oral Daily  . metoprolol succinate  25 mg Oral BID  . mirabegron ER  25 mg Oral QPM  . pantoprazole  40 mg Oral Daily  . polyethylene glycol  17 g Oral BID  . potassium chloride  20 mEq Oral Once  . psyllium  1 packet Oral Daily  . saccharomyces boulardii  250 mg Oral BID  . warfarin  5 mg Oral q1800  . Warfarin - Physician Dosing Inpatient   Does not apply q1800   Continuous Infusions: . cefTRIAXone (ROCEPHIN) IVPB 1 gram/100 mL NS (Mini-Bag Plus) Stopped (08/09/17 2335)  . lactated ringers 10 mL/hr at 08/08/17 1147  . methocarbamol (ROBAXIN)  IV       Marzetta Board, MD, PhD [facility] Pager [phone]  If 7PM-7AM, please contact night-coverage [email or web address] Password [facility] 08/10/2017, 11:21 AM

## 2017-08-10 NOTE — Progress Notes (Signed)
Orthopaedic Trauma Progress Note  S: Pain controlled. Received 2 units PRBCs, seen around 630PM and doing well.  O:  Vitals:   08/10/17 1435 08/10/17 1937  BP: (!) 112/37 (!) 124/43  Pulse: 76 82  Resp: 16 19  Temp: 98.2 F (36.8 C) 98.3 F (36.8 C)  SpO2: 98% 100%   LLE: Dressing clean dry and intact, neuro intact at baseline  Imaging: Stable postoperative imaging  Labs:  CBC    Component Value Date/Time   WBC 8.6 08/10/2017 1409   RBC 2.84 (L) 08/10/2017 1409   HGB 8.0 (L) 08/10/2017 1409   HCT 24.5 (L) 08/10/2017 1409   PLT 197 08/10/2017 1409   MCV 86.3 08/10/2017 1409   MCH 28.2 08/10/2017 1409   MCHC 32.7 08/10/2017 1409   RDW 14.1 08/10/2017 1409   LYMPHSABS 0.7 08/09/2017 0639   MONOABS 1.2 (H) 08/09/2017 0639   EOSABS 0.0 08/09/2017 0639   BASOSABS 0.0 08/09/2017 06334    A/P: 84 year old female s/p ORIF of left periprosthetic distal femur fracture  Weightbearing: WBAT LLE Insicional and dressing care: OK to remove dressings POD2/3 and leave open to air with dry gauze PRN Orthopedic device(s): None Showering: Not yet VTE prophylaxis: Monitor hemoglobin will continue coumadin Pain control: Norco and robaxin Follow - up plan: 2 weeks  Shona Needles, MD [facility] [phone] (phone)

## 2017-08-10 NOTE — Progress Notes (Signed)
ANTICOAGULATION CONSULT NOTE - Initial Consult  Pharmacy Consult:  Coumadin Indication: atrial fibrillation  Allergies  Allergen Reactions  . Sulfamethoxazole Diarrhea  . Ampicillin Other (See Comments)    C-DIF AFTER TAKING AMPICILLIN    Has patient had a PCN reaction causing immediate rash, facial/tongue/throat swelling, SOB or lightheadedness with hypotension: No Has patient had a PCN reaction causing severe rash involving mucus membranes or skin necrosis: No Has patient had a PCN reaction that required hospitalization: No Has patient had a PCN reaction occurring within the last 10 years: No If all of the above answers are "NO", then may proceed with Cephalosporin use.  . Clindamycin/Lincomycin Other (See Comments)    PT STATES HER DOCTOR TOLD HER NOT TO TAKE CLINDAMYCIN BECAUSE SHE GOT C-DIFF AFTER TAKING AMPICILLIN  . Latex Other (See Comments)    Blisters   . Pradaxa [Dabigatran Etexilate Mesylate] Other (See Comments)    INTERNAL BLEEDING  . Ciprofloxacin Rash    RASH PT STATES ANY OTHER DRUGS IN [facility] FAMILY SHE IS ALLERGIC TO    Patient Measurements: Height: 5' 4.5" (163.8 cm) Weight: 161 lb (73 kg) IBW/kg (Calculated) : 55.85  Vital Signs: Temp: 99.3 F (37.4 C) (05/15 0945) Temp Source: Oral (05/15 0945) BP: 140/57 (05/15 0945) Pulse Rate: 72 (05/15 0945)  Labs: Recent Labs    08/07/17 1320 08/08/17 0001 08/08/17 0553  08/08/17 1624 08/09/17 0639 08/10/17 0300  HGB  --  9.8*  --    < > 9.5* 8.2* 6.7*  HCT  --  29.9*  --    < > 28.0* 23.9* 20.8*  PLT  --  187  --   --   --  208 175  LABPROT  --   --   --   --   --   --  30.9*  INR  --   --   --   --   --   --  3.00  CREATININE  --  1.12*  --   --   --  1.16* 1.31*  TROPONINI 0.03* 0.03* 0.03*  --   --   --   --    < > = values in this interval not displayed.    Estimated Creatinine Clearance: 32.8 mL/min (A) (by C-G formula based on SCr of 1.31 mg/dL (H)).    Assessment: 35 YOF with history of  Afib on Coumadin PTA.  She presented s/p fall and underwent ORIF of left femur fracture on 5/13//19.  Coumadin resumed on 08/09/17 per MD, now to transition to per Pharmacy.  INR therapeutic and at the high end of normal.  Hemoglobin decreased to 6.7 g/dL, but no bleeding reported and okay to continue Coumadin per MD.  Eating 50% of meals.  Home Coumadin dose: 5mg  PO daily except 7.5mg  on Sat   Goal of Therapy:  INR 2-3 Monitor platelets by anticoagulation protocol: Yes    Plan:  Reduce to Coumadin 2.5mg  PO today Daily PT / INR  Watch hgb   Morrell Fluke D. Mina Marble, PharmD, BCPS, BCCCP Pager:  [phone] 08/10/2017, 11:34 AM

## 2017-08-10 NOTE — Progress Notes (Signed)
CRITICAL VALUE ALERT  Critical Value:  6.7 Hg  Date & Time Notied:  04:27  08/10/2017  Provider Notified: Olean Ree  Orders Received/Actions taken: Waiting Response from Dr.

## 2017-08-10 NOTE — Progress Notes (Signed)
Physical Therapy Treatment Patient Details Name: [name] MRN: [id number] DOB: 1934/01/20 Today's Date: 08/10/2017    History of Present Illness Pt is an 84 y.o. female with medical history significant of CVA   residual right-sided facial droop secondary to mastoiditis as a child, a.fib on coumadin, breast cancer, GERD, HTN, HLD to the hospital after a fall she was noted to have left periprosthetic hip fracture.  Patient underwent open reduction and internal fixation by orthopedics after admission.     PT Comments    Pt making slow progress with functional mobility and remains limited secondary to L hip pain. Pt continues to require heavy physical assistance of two for bed mobility and for sit<>stand transfers with RW. Pt would continue to benefit from skilled physical therapy services at this time while admitted and after d/c to address the below listed limitations in order to improve overall safety and independence with functional mobility.    Follow Up Recommendations  SNF     Equipment Recommendations  None recommended by PT    Recommendations for Other Services       Precautions / Restrictions Precautions Precautions: Fall Restrictions Weight Bearing Restrictions: Yes LLE Weight Bearing: Weight bearing as tolerated    Mobility  Bed Mobility Overal bed mobility: Needs Assistance Bed Mobility: Supine to Sit;Sit to Supine     Supine to sit: Max assist;+2 for physical assistance Sit to supine: +2 for physical assistance;Total assist   General bed mobility comments: increased time and effort, cueing for sequencing, use of bed pads to assist with scooting pt's hips to EOB, heavy physical assistance of two for bilateral LE movement and trunk elevation  Transfers Overall transfer level: Needs assistance Equipment used: Rolling walker (2 wheeled) Transfers: Sit to/from Stand Sit to Stand: +2 safety/equipment;Max assist         General transfer comment: increased  time, cueing for safe hand placement, bed in elevated position, heavy assist of two to power into standing, vc'ing for posture and for hip extension  Ambulation/Gait             General Gait Details: pt unable to advance either LE forward at this time   Stairs             Wheelchair Mobility    Modified Rankin (Stroke Patients Only)       Balance Overall balance assessment: Needs assistance;History of Falls Sitting-balance support: Feet supported Sitting balance-Leahy Scale: Fair     Standing balance support: Bilateral upper extremity supported Standing balance-Leahy Scale: Poor                              Cognition Arousal/Alertness: Awake/alert Behavior During Therapy: Anxious;WFL for tasks assessed/performed Overall Cognitive Status: Within Functional Limits for tasks assessed                                        Exercises General Exercises - Lower Extremity Ankle Circles/Pumps: AROM;Both;10 reps;Seated Long Arc Quad: AAROM;Left;10 reps;Seated Heel Raises: AROM;Both;10 reps;Seated    General Comments        Pertinent Vitals/Pain Pain Assessment: Faces Faces Pain Scale: Hurts even more Pain Location: R hip Pain Descriptors / Indicators: Discomfort;Grimacing;Guarding Pain Intervention(s): Monitored during session;Repositioned    Home Living  Prior Function            PT Goals (current goals can now be found in the care plan section) Acute Rehab PT Goals PT Goal Formulation: With patient Time For Goal Achievement: 08/23/17 Potential to Achieve Goals: Good Progress towards PT goals: Progressing toward goals    Frequency    Min 2X/week      PT Plan Frequency needs to be updated    Co-evaluation              AM-PAC PT "6 Clicks" Daily Activity  Outcome Measure  Difficulty turning over in bed (including adjusting bedclothes, sheets and blankets)?: Unable Difficulty  moving from lying on back to sitting on the side of the bed? : Unable Difficulty sitting down on and standing up from a chair with arms (e.g., wheelchair, bedside commode, etc,.)?: Unable Help needed moving to and from a bed to chair (including a wheelchair)?: Total Help needed walking in hospital room?: Total Help needed climbing 3-5 steps with a railing? : Total 6 Click Score: 6    End of Session Equipment Utilized During Treatment: Gait belt Activity Tolerance: Patient limited by pain Patient left: in bed;with call bell/phone within reach;with bed alarm set;with nursing/sitter in room;with family/visitor present Nurse Communication: Mobility status PT Visit Diagnosis: Other abnormalities of gait and mobility (R26.89);Pain Pain - Right/Left: Left Pain - part of body: Hip     Time: 1610-9604 PT Time Calculation (min) (ACUTE ONLY): 26 min  Charges:  $Therapeutic Activity: 23-37 mins                    G Codes:       Edgar, PT, DPT Garza 08/10/2017, 5:04 PM

## 2017-08-11 DIAGNOSIS — M978XXD Periprosthetic fracture around other internal prosthetic joint, subsequent encounter: Secondary | ICD-10-CM | POA: Diagnosis not present

## 2017-08-11 DIAGNOSIS — R600 Localized edema: Secondary | ICD-10-CM | POA: Diagnosis not present

## 2017-08-11 DIAGNOSIS — M2550 Pain in unspecified joint: Secondary | ICD-10-CM | POA: Diagnosis not present

## 2017-08-11 DIAGNOSIS — R41841 Cognitive communication deficit: Secondary | ICD-10-CM | POA: Diagnosis not present

## 2017-08-11 DIAGNOSIS — R2689 Other abnormalities of gait and mobility: Secondary | ICD-10-CM | POA: Diagnosis not present

## 2017-08-11 DIAGNOSIS — Z8673 Personal history of transient ischemic attack (TIA), and cerebral infarction without residual deficits: Secondary | ICD-10-CM | POA: Diagnosis not present

## 2017-08-11 DIAGNOSIS — M25552 Pain in left hip: Secondary | ICD-10-CM | POA: Diagnosis not present

## 2017-08-11 DIAGNOSIS — M978XXA Periprosthetic fracture around other internal prosthetic joint, initial encounter: Secondary | ICD-10-CM | POA: Diagnosis not present

## 2017-08-11 DIAGNOSIS — Z9181 History of falling: Secondary | ICD-10-CM | POA: Diagnosis not present

## 2017-08-11 DIAGNOSIS — Z4789 Encounter for other orthopedic aftercare: Secondary | ICD-10-CM | POA: Diagnosis not present

## 2017-08-11 DIAGNOSIS — R29810 Facial weakness: Secondary | ICD-10-CM | POA: Diagnosis not present

## 2017-08-11 DIAGNOSIS — Z96649 Presence of unspecified artificial hip joint: Secondary | ICD-10-CM | POA: Diagnosis not present

## 2017-08-11 DIAGNOSIS — D649 Anemia, unspecified: Secondary | ICD-10-CM | POA: Diagnosis not present

## 2017-08-11 DIAGNOSIS — R300 Dysuria: Secondary | ICD-10-CM | POA: Diagnosis not present

## 2017-08-11 DIAGNOSIS — N390 Urinary tract infection, site not specified: Secondary | ICD-10-CM | POA: Diagnosis not present

## 2017-08-11 DIAGNOSIS — M25562 Pain in left knee: Secondary | ICD-10-CM | POA: Diagnosis not present

## 2017-08-11 DIAGNOSIS — M6281 Muscle weakness (generalized): Secondary | ICD-10-CM | POA: Diagnosis not present

## 2017-08-11 DIAGNOSIS — S72402D Unspecified fracture of lower end of left femur, subsequent encounter for closed fracture with routine healing: Secondary | ICD-10-CM | POA: Diagnosis not present

## 2017-08-11 DIAGNOSIS — S72402S Unspecified fracture of lower end of left femur, sequela: Secondary | ICD-10-CM | POA: Diagnosis not present

## 2017-08-11 DIAGNOSIS — R350 Frequency of micturition: Secondary | ICD-10-CM | POA: Diagnosis not present

## 2017-08-11 DIAGNOSIS — R102 Pelvic and perineal pain: Secondary | ICD-10-CM | POA: Diagnosis not present

## 2017-08-11 DIAGNOSIS — I10 Essential (primary) hypertension: Secondary | ICD-10-CM | POA: Diagnosis not present

## 2017-08-11 DIAGNOSIS — R52 Pain, unspecified: Secondary | ICD-10-CM | POA: Diagnosis not present

## 2017-08-11 DIAGNOSIS — M978XXS Periprosthetic fracture around other internal prosthetic joint, sequela: Secondary | ICD-10-CM | POA: Diagnosis not present

## 2017-08-11 DIAGNOSIS — Z7401 Bed confinement status: Secondary | ICD-10-CM | POA: Diagnosis not present

## 2017-08-11 DIAGNOSIS — I480 Paroxysmal atrial fibrillation: Secondary | ICD-10-CM | POA: Diagnosis not present

## 2017-08-11 DIAGNOSIS — Z96642 Presence of left artificial hip joint: Secondary | ICD-10-CM | POA: Diagnosis not present

## 2017-08-11 LAB — BPAM RBC
Blood Product Expiration Date: 201905162359
Blood Product Expiration Date: 201905172359
Blood Product Expiration Date: 201905282359
Blood Product Expiration Date: 201906012359
ISSUE DATE / TIME: 201905121337
ISSUE DATE / TIME: 201905121643
ISSUE DATE / TIME: 201905150617
Unit Type and Rh: 600
Unit Type and Rh: 600
Unit Type and Rh: 6200
Unit Type and Rh: 6200

## 2017-08-11 LAB — CBC
HCT: 25.6 % — ABNORMAL LOW (ref 36.0–46.0)
Hemoglobin: 8.4 g/dL — ABNORMAL LOW (ref 12.0–15.0)
MCH: 28.6 pg (ref 26.0–34.0)
MCHC: 32.8 g/dL (ref 30.0–36.0)
MCV: 87.1 fL (ref 78.0–100.0)
Platelets: 232 10*3/uL (ref 150–400)
RBC: 2.94 MIL/uL — ABNORMAL LOW (ref 3.87–5.11)
RDW: 14.5 % (ref 11.5–15.5)
WBC: 9.5 10*3/uL (ref 4.0–10.5)

## 2017-08-11 LAB — TYPE AND SCREEN
ABO/RH(D): A POS
Antibody Screen: NEGATIVE
Unit division: 0
Unit division: 0
Unit division: 0
Unit division: 0

## 2017-08-11 LAB — BASIC METABOLIC PANEL
Anion gap: 9 (ref 5–15)
BUN: 23 mg/dL — ABNORMAL HIGH (ref 6–20)
CO2: 26 mmol/L (ref 22–32)
Calcium: 7.9 mg/dL — ABNORMAL LOW (ref 8.9–10.3)
Chloride: 103 mmol/L (ref 101–111)
Creatinine, Ser: 1.08 mg/dL — ABNORMAL HIGH (ref 0.44–1.00)
GFR calc Af Amer: 54 mL/min — ABNORMAL LOW (ref >60)
GFR calc non Af Amer: 46 mL/min — ABNORMAL LOW (ref >60)
Glucose, Bld: 139 mg/dL — ABNORMAL HIGH (ref 65–99)
Potassium: 3.7 mmol/L (ref 3.5–5.1)
Sodium: 138 mmol/L (ref 135–145)

## 2017-08-11 LAB — PROTIME-INR
INR: 2.16
Prothrombin Time: 23.9 seconds — ABNORMAL HIGH (ref 11.4–15.2)

## 2017-08-11 MED ORDER — SENNOSIDES-DOCUSATE SODIUM 8.6-50 MG PO TABS: 2.0000 | ORAL_TABLET | Freq: Every evening | ORAL | Status: DC | PRN

## 2017-08-11 MED ORDER — BISACODYL 10 MG RE SUPP
10.0000 mg | Freq: Every day | RECTAL | Status: DC | PRN
  Administered 2017-08-11: 10 mg via RECTAL
  Filled 2017-08-11: qty 1

## 2017-08-11 MED ORDER — HYDROCODONE-ACETAMINOPHEN 5-325 MG PO TABS: 1.0000 | ORAL_TABLET | ORAL | 0 refills | Status: AC | PRN

## 2017-08-11 NOTE — Anesthesia Postprocedure Evaluation (Signed)
Anesthesia Post Note  Patient: [name]  Procedure(s) Performed: OPEN REDUCTION INTERNAL FIXATION (ORIF) DISTAL FEMUR FRACTURE (Left )     Patient location during evaluation: PACU Anesthesia Type: General Level of consciousness: awake Pain management: pain level controlled Vital Signs Assessment: post-procedure vital signs reviewed and stable Respiratory status: spontaneous breathing Cardiovascular status: stable Postop Assessment: no apparent nausea or vomiting Anesthetic complications: no    Last Vitals:  Vitals:   08/10/17 1937 08/11/17 0500  BP: (!) 124/43 (!) 129/56  Pulse: 82 76  Resp: 19 17  Temp: 36.8 C 36.7 C  SpO2: 100% 100%    Last Pain:  Vitals:   08/11/17 0500  TempSrc: Oral  PainSc:    Pain Goal: Patients Stated Pain Goal: 1 (08/09/17 0412)               Kerrion Kemppainen JR,JOHN Mateo Flow

## 2017-08-11 NOTE — Clinical Social Work Placement (Signed)
Nurse to call report to [phone], Room 308P  Transport set for 3:00 PM.    CLINICAL SOCIAL WORK PLACEMENT  NOTE  Date:  08/11/2017  Patient Details  Name: [name] MRN: [id number] Date of Birth: Jun 16, 1933  Clinical Social Work is seeking post-discharge placement for this patient at the [place] level of care (*CSW will initial, date and re-position this form in  chart as items are completed):  Yes   Patient/family provided with [place] Work Department's list of facilities offering this level of care within the geographic area requested by the patient (or if unable, by the patient's family).  Yes   Patient/family informed of their freedom to choose among providers that offer the needed level of care, that participate in Medicare, Medicaid or managed care program needed by the patient, have an available bed and are willing to accept the patient.  Yes   Patient/family informed of [facility]'s ownership interest in [facility] and [facility], as well as of the fact that they are under no obligation to receive care at these facilities.  PASRR submitted to EDS on       PASRR number received on       Existing PASRR number confirmed on 08/10/17     FL2 transmitted to all facilities in geographic area requested by pt/family on 08/10/17     FL2 transmitted to all facilities within larger geographic area on       Patient informed that his/her managed care company has contracts with or will negotiate with certain facilities, including the following:        Yes   Patient/family informed of bed offers received.  Patient chooses bed at [facility]     Physician recommends and patient chooses bed at      Patient to be transferred to [facility] on 08/11/17.  Patient to be transferred to facility by [facility]     Patient family notified on 08/11/17 of transfer.  Name of family member notified:  Husband     PHYSICIAN        Additional Comment:    _______________________________________________ Geralynn Ochs, LCSW 08/11/2017, 11:52 AM

## 2017-08-11 NOTE — Discharge Summary (Signed)
Physician Discharge Summary  Murle Hellstrom DGU:[id number] DOB: Dec 28, 1933 DOA: 08/06/2017  PCP: Leanna Battles, MD  Admit date: 08/06/2017 Discharge date: 08/11/2017  Admitted From: home Disposition:  SNF - [facility]  Recommendations for Outpatient Follow-up:  1. Follow up with Dr. Doreatha Martin in 1-2 weeks 2. Please monitor CBC / BMP in 3-4 days  Home Health: none Equipment/Devices: none  Discharge Condition: stable CODE STATUS: Full code Diet recommendation: heart healthy  HPI: Per Dr. Roel Cluck, [name] is a 84 y.o. 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 She felt dizzy she had her cain with her. No head injury no LOC. Denies black stool or blood in stool. Reports recent symptoms of UTI. Reports diarrhea recently x3 no vomiting but slight nausea Husband gave her immodium. She has not slept well. She went to urology on Friday and gave a sample.  UA positive for UTI  last time she was treated with Manurol.  Last urine culture Grew Proteus resistant to tetracycline At baseline able to walk up a stair case or walk a block with out chest pain.   Hospital Course: Periprosthetic hip fracture -patient was admitted to the hospital with a periprosthetic hip fracture. Orthopedic surgery consulted and followed patient while hospitalized.  She underwent surgery and is status post open reduction internal fixation on 5/11 by Dr. Doreatha Martin.  She recovered well postop, is able to work with physical therapy and will be discharged to SNF for continuing rehabilitation.  Acute blood loss anemia -Received 2 units of packed red blood cells on 5/12, hemoglobin improved but postop required 2 additional units of packed red blood cells.  Her hemoglobin improved appropriately and started to increase on its own without  further interventions.  She has no evidence of bleeding.  Chronic kidney disease stage III -baseline Cr ~1.5, currently slightly better than her baseline Urinary tract infection -Urine cultures unfortunately without speciation but she was symptomatic, treated for 5 days while hospitalized with ceftriaxone, completed a course while here Paroxysmal A. fib, prior CVA -Continue rate control with metoprolol, continue Coumadin Hypertension -resume home medications Hyperlipidemia -Continue statin   Discharge Diagnoses:  Principal Problem:   Periprosthetic fracture of shaft of femur Active Problems:   Diarrhea   Paroxysmal atrial fibrillation (HCC)   Hypertension   Anemia   Lightheadedness   Femur fracture, left (HCC)   Dehydration   Acute lower UTI     Discharge Instructions   Allergies as of 08/11/2017      Reactions   Sulfamethoxazole Diarrhea   Ampicillin Other (See Comments)   C-DIF AFTER TAKING AMPICILLIN    Has patient had a PCN reaction causing immediate rash, facial/tongue/throat swelling, SOB or lightheadedness with hypotension: No Has patient had a PCN reaction causing severe rash involving mucus membranes or skin necrosis: No Has patient had a PCN reaction that required hospitalization: No Has patient had a PCN reaction occurring within the last 10 years: No If all of the above answers are "NO", then may proceed with Cephalosporin use.   Clindamycin/lincomycin Other (See Comments)   PT STATES HER DOCTOR TOLD HER NOT TO TAKE CLINDAMYCIN BECAUSE SHE GOT C-DIFF AFTER TAKING AMPICILLIN   Latex Other (See Comments)   Blisters   Pradaxa [dabigatran Etexilate Mesylate] Other (See Comments)   INTERNAL BLEEDING  Ciprofloxacin Rash   RASH PT STATES ANY OTHER DRUGS IN [facility] SHE IS ALLERGIC TO      Medication List    STOP taking these medications   diazepam 2 MG tablet Commonly known as:  VALIUM   ondansetron 4 MG disintegrating tablet Commonly known as:  ZOFRAN  ODT     TAKE these medications   atorvastatin 20 MG tablet Commonly known as:  LIPITOR Take 20 mg by mouth daily after supper.   chlorthalidone 50 MG tablet Commonly known as:  HYGROTON Take 50 mg by mouth daily.   CRANBERRY PO Take 1 capsule by mouth 2 (two) times daily.   diphenhydramine-acetaminophen 25-500 MG Tabs tablet Commonly known as:  TYLENOL PM Take 1 tablet by mouth at bedtime.   estradiol 0.1 MG/GM vaginal cream Commonly known as:  ESTRACE Place 1 Applicatorful vaginally See admin instructions. Apply vaginally twice weekly   HYDROcodone-acetaminophen 5-325 MG tablet Commonly known as:  NORCO Take 1 tablet by mouth every 4 (four) hours as needed for up to 12 days for moderate pain.   loperamide 2 MG capsule Commonly known as:  IMODIUM Take 2-4 mg by mouth 5 (five) times daily as needed for diarrhea or loose stools.   losartan 100 MG tablet Commonly known as:  COZAAR Take 100 mg by mouth daily.   metoprolol succinate 25 MG 24 hr tablet Commonly known as:  TOPROL-XL Take 1 tablet (25 mg total) by mouth daily. What changed:  when to take this   multivitamin with minerals Tabs tablet Take 1 tablet by mouth daily.   MYRBETRIQ 25 MG Tb24 tablet Generic drug:  mirabegron ER Take 25 mg by mouth every evening.   pantoprazole 40 MG tablet Commonly known as:  PROTONIX Take 40 mg by mouth daily.   PRESERVISION/LUTEIN Caps Take 1 capsule by mouth daily.   ranitidine 150 MG capsule Commonly known as:  ZANTAC Take 150 mg by mouth daily as needed for heartburn.   saccharomyces boulardii 250 MG capsule Commonly known as:  FLORASTOR Take 250-500 mg by mouth See admin instructions. Take one tablet (250 mg) by mouth every morning and two tablets (500 mg) at night   SYSTANE 0.4-0.3 % Soln Generic drug:  Polyethyl Glycol-Propyl Glycol Place 1 drop into both eyes See admin instructions. Instill one drop into both eyes every other night at bedtime (alternate with  gel)   SYSTANE 0.4-0.3 % Gel ophthalmic gel Generic drug:  Polyethyl Glycol-Propyl Glycol Place 1 application into both eyes See admin instructions. Apply to both eyes every other night at bedtime (alternate with drops)   VSL#3 PO Take 1 capsule by mouth daily.   warfarin 5 MG tablet Commonly known as:  COUMADIN Take 5 mg by mouth daily with supper. 5mg  by mouth daily, except 7.5mg  on Saturdays       Contact information for follow-up providers    Haddix, Thomasene Lot, MD. Schedule an appointment as soon as possible for a visit in 2 week(s).   Specialty:  Orthopedic Surgery Contact information: [address] [place] [address] [phone]            Contact information for after-discharge care    Destination    [facility] .   Service:  Skilled Nursing Contact information: [place] [phone]                  Consultations:  Orthopedic surgery   Procedures/Studies:  ORIF left periprosthetic hip fracture on 5/11  Dg Chest 1 View  Result Date: 08/06/2017 CLINICAL DATA:  84 year old female status post fall, in severe pain. Acute left femur fracture. EXAM: CHEST  1 VIEW COMPARISON:  Chest CTA and radiographs 11/04/2015 and earlier. FINDINGS: Portable AP supine view at 1712 hours. Stable cardiomegaly and mediastinal contours. Stable tortuous thoracic aorta. Visualized tracheal air column is within normal limits. Allowing for portable technique the lungs are clear. Osteopenia. No acute osseous abnormality identified. IMPRESSION: No acute cardiopulmonary abnormality. Electronically Signed   By: Genevie Ann M.D.   On: 08/06/2017 17:37   Dg Pelvis 1-2 Views  Result Date: 08/06/2017 CLINICAL DATA:  84 year old female status post fall, in severe pain. Acute left femur fracture. EXAM: PELVIS - 1-2 VIEW COMPARISON:  Left femur series today reported separately. CT Abdomen and Pelvis 08/11/2012. FINDINGS: Supine view  at 1713 hours. The femoral heads remain normally located. The proximal femurs appear intact. The pelvis appears stable and intact. Sacral ala and SI joints appear stable and intact. Stable chronic lower lumbar degenerative facet hypertrophy. Negative visible bowel gas pattern. IMPRESSION: No acute fracture or dislocation identified about the pelvis. Electronically Signed   By: Genevie Ann M.D.   On: 08/06/2017 17:37   Ct Head Wo Contrast  Result Date: 08/06/2017 CLINICAL DATA:  [place] today onto LEFT side, head trauma, history atrial fibrillation, chronic anticoagulation therapy, high clinical risk, history breast cancer, asthma, hypertension, stroke EXAM: CT HEAD WITHOUT CONTRAST CT CERVICAL SPINE WITHOUT CONTRAST TECHNIQUE: Multidetector CT imaging of the head and cervical spine was performed following the standard protocol without intravenous contrast. Multiplanar CT image reconstructions of the cervical spine were also generated. COMPARISON:  01/28/2017 CT head, CT cervical spine 08/18/2010 FINDINGS: CT HEAD FINDINGS Brain: Generalized atrophy. Normal ventricular morphology. No midline shift or mass effect. Small vessel chronic ischemic changes of deep cerebral white matter. Old RIGHT posterior parietal and LEFT cerebellar infarcts. Old lacunar infarcts BILATERAL basal ganglia. No intracranial hemorrhage, mass lesion, evidence of acute infarction, or extra-axial fluid collection. Vascular: Atherosclerotic calcification of internal carotid and vertebral arteries bilaterally at skull base Skull: Skull intact Sinuses/Orbits: Small amount of fluid/mucous in the RIGHT sphenoid sinus. Opacified LEFT ethmoid air cell. Visualized paranasal sinuses and LEFT mastoid air cells otherwise clear. Prior RIGHT mastoidectomy. Nasal septal deviation to the RIGHT. Other: N/A CT CERVICAL SPINE FINDINGS Alignment: Normal, stable Skull base and vertebrae: Diffuse osseous demineralization. Scattered facet degenerative changes. Ankylosis  of the LEFT C4-C5 facet joint. Visualized skull base intact. No acute fracture or bone destruction. Soft tissues and spinal canal: Prevertebral soft tissues normal thickness. Atherosclerotic calcifications at the LEFT carotid bifurcation and proximal great vessels. Disc levels:  No additional abnormalities Upper chest: Lung apices clear Other: N/A IMPRESSION: Atrophy with small vessel chronic ischemic changes of deep cerebral white matter. Old RIGHT posterior parietal and LEFT cerebellar infarcts as well as BILATERAL basal ganglia lacunar infarcts. No acute intracranial abnormalities. Degenerative facet disease changes of the cervical spine. No acute cervical spine abnormalities. Electronically Signed   By: Lavonia Dana M.D.   On: 08/06/2017 18:04   Ct Cervical Spine Wo Contrast  Result Date: 08/06/2017 CLINICAL DATA:  [place] today onto LEFT side, head trauma, history atrial fibrillation, chronic anticoagulation therapy, high clinical risk, history breast cancer, asthma, hypertension, stroke EXAM: CT HEAD WITHOUT CONTRAST CT CERVICAL SPINE WITHOUT CONTRAST TECHNIQUE: Multidetector CT imaging of the head and cervical spine was performed following the standard protocol without intravenous contrast. Multiplanar  CT image reconstructions of the cervical spine were also generated. COMPARISON:  01/28/2017 CT head, CT cervical spine 08/18/2010 FINDINGS: CT HEAD FINDINGS Brain: Generalized atrophy. Normal ventricular morphology. No midline shift or mass effect. Small vessel chronic ischemic changes of deep cerebral white matter. Old RIGHT posterior parietal and LEFT cerebellar infarcts. Old lacunar infarcts BILATERAL basal ganglia. No intracranial hemorrhage, mass lesion, evidence of acute infarction, or extra-axial fluid collection. Vascular: Atherosclerotic calcification of internal carotid and vertebral arteries bilaterally at skull base Skull: Skull intact Sinuses/Orbits: Small amount of fluid/mucous in the RIGHT  sphenoid sinus. Opacified LEFT ethmoid air cell. Visualized paranasal sinuses and LEFT mastoid air cells otherwise clear. Prior RIGHT mastoidectomy. Nasal septal deviation to the RIGHT. Other: N/A CT CERVICAL SPINE FINDINGS Alignment: Normal, stable Skull base and vertebrae: Diffuse osseous demineralization. Scattered facet degenerative changes. Ankylosis of the LEFT C4-C5 facet joint. Visualized skull base intact. No acute fracture or bone destruction. Soft tissues and spinal canal: Prevertebral soft tissues normal thickness. Atherosclerotic calcifications at the LEFT carotid bifurcation and proximal great vessels. Disc levels:  No additional abnormalities Upper chest: Lung apices clear Other: N/A IMPRESSION: Atrophy with small vessel chronic ischemic changes of deep cerebral white matter. Old RIGHT posterior parietal and LEFT cerebellar infarcts as well as BILATERAL basal ganglia lacunar infarcts. No acute intracranial abnormalities. Degenerative facet disease changes of the cervical spine. No acute cervical spine abnormalities. Electronically Signed   By: Lavonia Dana M.D.   On: 08/06/2017 18:04   Ct Knee Left Wo Contrast  Result Date: 08/06/2017 CLINICAL DATA:  Recent fall with known distal femoral fracture EXAM: CT OF THE LEFT KNEE WITHOUT CONTRAST TECHNIQUE: Multidetector CT imaging of the left knee was performed according to the standard protocol. Multiplanar CT image reconstructions were also generated. COMPARISON:  Plain films from earlier in the same day. FINDINGS: Bones/Joint/Cartilage There is a comminuted fracture of the distal femur extending from the diaphysis into the metaphysis. The prosthesis is not significantly displaced although the fracture lines do extend to the level of the distal femur adjacent to the prosthesis. Mild posterior displacement of the distal fracture fragments of approximately 1.7 cm is noted. Lateral displacement of the fracture fragments is seen of approximately 2.4 cm.  This is a rough estimate due to the complex nature of the fracture. Additionally mild impaction of the distal fracture fragments is seen. The tibial component of the prosthesis is within normal limits as is the proximal tibia and fibula. Ligaments Suboptimally assessed by CT. Muscles and Tendons The surrounding musculature appears within normal limits with the exception of some mild edema related to the recent injury. Soft tissues Mild adjacent soft tissue swelling is noted in the region of the femoral fracture. No definitive arterial or venous abnormality is noted although the lack of contrast limits evaluation. Diffuse vascular calcifications are seen. IMPRESSION: Comminuted distal left femoral fracture extending to the level of the femoral portion of the knee prosthesis. The prosthesis does not appear to be significantly displaced. The distal fracture fragments are displaced posteriorly and laterally as described with some degree of impaction at the fracture site as well. Electronically Signed   By: Inez Catalina M.D.   On: 08/06/2017 20:14   Dg Shoulder Left  Result Date: 08/09/2017 CLINICAL DATA:  Pt c/o left shoulder pain, pt states she fell at home on her tile floor on Thursday and landed on her left side. EXAM: LEFT SHOULDER - 2+ VIEW COMPARISON:  None. FINDINGS: Diffuse osteopenia limits characterization of osseous  detail, however, there is no fracture line or displaced fracture fragment seen. LEFT humeral head appears normally positioned relative to the glenoid fossa. Overlying acromioclavicular joint space is normally aligned. Soft tissues about the LEFT shoulder are unremarkable. IMPRESSION: 1. No acute findings. 2. Osteopenia. Electronically Signed   By: Franki Cabot M.D.   On: 08/09/2017 22:57   Dg C-arm 1-60 Min  Result Date: 08/08/2017 CLINICAL DATA:  Intraoperative imaging for fixation of a periprosthetic left knee fracture suffered in a fall 08/06/2017. EXAM: DG C-ARM 61-120 MIN; LEFT FEMUR  2 VIEWS COMPARISON:  CT and plain films left knee 08/06/2017. FINDINGS: Six fluoroscopic intraoperative spot views of the left knee are provided. Images demonstrate placement lateral plate and screws for fixation of a periprosthetic distal femur fracture. Position and alignment are improved. No new abnormality. IMPRESSION: Intraoperative imaging for fixation of a periprosthetic distal left femur fracture. No acute finding. Electronically Signed   By: Inge Rise M.D.   On: 08/08/2017 15:00   Dg Femur Min 2 Views Left  Result Date: 08/08/2017 CLINICAL DATA:  Intraoperative imaging for fixation of a periprosthetic left knee fracture suffered in a fall 08/06/2017. EXAM: DG C-ARM 61-120 MIN; LEFT FEMUR 2 VIEWS COMPARISON:  CT and plain films left knee 08/06/2017. FINDINGS: Six fluoroscopic intraoperative spot views of the left knee are provided. Images demonstrate placement lateral plate and screws for fixation of a periprosthetic distal femur fracture. Position and alignment are improved. No new abnormality. IMPRESSION: Intraoperative imaging for fixation of a periprosthetic distal left femur fracture. No acute finding. Electronically Signed   By: Inge Rise M.D.   On: 08/08/2017 15:00   Dg Femur Min 2 Views Left  Result Date: 08/06/2017 CLINICAL DATA:  Status post placement of a knee immobilizer EXAM: LEFT FEMUR 2 VIEWS COMPARISON:  Films from earlier in the same day. FINDINGS: The proximal left femur is within normal limits. The previously seen comminuted distal femoral fracture is again seen. The degree of angulation and displacement has reduced somewhat. No other focal abnormality is noted. IMPRESSION: Slight reduction in distal femoral fracture. Electronically Signed   By: Inez Catalina M.D.   On: 08/06/2017 19:13   Dg Femur Min 2 Views Left  Result Date: 08/06/2017 CLINICAL DATA:  84 year old female status post fall, in severe pain. Externally rotated left lower leg. EXAM: LEFT FEMUR 2 VIEWS  COMPARISON:  Left tib-fib series 06/10/2009. FINDINGS: The left femoral head remains normally located in the proximal left femur appears intact. The left hemipelvis appears intact. Osteopenia. Spiral comminuted, displaced, and impacted fracture of the distal left femur metadiaphysis. The fracture extends to involve left total knee femoral hardware. There is a large 9 centimeter butterfly fragment projecting obliquely over the distal femoral shaft. There is greater than 1 full shaft width posterior displacement and superimposed posterior angulation. There is also rotation of the distal fragment. IMPRESSION: 1. Severely comminuted fracture of the distal left femur metadiaphysis extending to involve preexisting left total knee hardware. Greater than 1 full shaft width posterior displacement with superimposed angulation and rotation of the distal fragment. 2. The mid and proximal left femur remain intact. Electronically Signed   By: Genevie Ann M.D.   On: 08/06/2017 17:36   Dg Femur Port Min 2 Views Left  Result Date: 08/08/2017 CLINICAL DATA:  Postoperative fixation for femur fracture EXAM: LEFT FEMUR PORTABLE 2 VIEWS COMPARISON:  Preoperative images Aug 06, 2017; intraoperative study Aug 08, 2017 FINDINGS: Frontal and lateral views were obtained. There is  screw and plate fixation through a comminuted fracture of the distal femoral diaphysis. There remains moderate displacement of fracture fragments slightly proximal to the distal screws with lateral and posterior displacement of the distal major fracture fragment with respect to the proximal major fragment. Screw and plate fixation traverses this area with alignment essentially anatomic between the proximal and distal aspects of the femur. There is a total knee replacement with prosthetic components well-seated. No dislocation. IMPRESSION: Surgical fixation through a comminuted fracture of the distal femur with overall alignment near anatomic, although there is  displacement of fracture fragments slightly proximal to the distal screws. Total knee replacement with prosthetic components well-seated. No new fracture. No dislocation. Electronically Signed   By: Lowella Grip III M.D.   On: 08/08/2017 16:20     Subjective: - no chest pain, shortness of breath, no abdominal pain, nausea or vomiting.   Discharge Exam: Vitals:   08/10/17 1937 08/11/17 0500  BP: (!) 124/43 (!) 129/56  Pulse: 82 76  Resp: 19 17  Temp: 98.3 F (36.8 C) 98 F (36.7 C)  SpO2: 100% 100%    General: Pt is alert, awake, not in acute distress Cardiovascular: irregular, no rubs, no gallops Respiratory: CTA bilaterally, no wheezing, no rhonchi Abdominal: Soft, NT, ND, bowel sounds + Extremities: no edema, no cyanosis    The results of significant diagnostics from this hospitalization (including imaging, microbiology, ancillary and laboratory) are listed below for reference.     Microbiology: Recent Results (from the past 240 hour(s))  Urine Culture     Status: Abnormal   Collection Time: 08/07/17  4:31 AM  Result Value Ref Range Status   Specimen Description URINE, CLEAN CATCH  Final   Special Requests   Final    NONE Performed at [facility], [address] [place], [place] [address]    Culture MULTIPLE SPECIES PRESENT, SUGGEST RECOLLECTION (A)  Final   Report Status 08/08/2017 FINAL  Final  Surgical pcr screen     Status: None   Collection Time: 08/08/17 12:11 AM  Result Value Ref Range Status   MRSA, PCR NEGATIVE NEGATIVE Final   Staphylococcus aureus NEGATIVE NEGATIVE Final    Comment: (NOTE) The Xpert SA Assay (FDA approved for NASAL specimens in patients 67 years of age and older), is one component of a comprehensive surveillance program. It is not intended to diagnose infection nor to guide or monitor treatment. Performed at [facility], [place] [address], [place], [place] [address]      Labs: BNP (last 3 results) No results for  input(s): BNP in the last 8760 hours. Basic Metabolic Panel: Recent Labs  Lab 08/07/17 0845 08/08/17 0001 08/08/17 1605 08/08/17 1624 08/09/17 0639 08/10/17 0300 08/11/17 0336  NA 136 136 137 137 137 135 138  K 3.9 3.8 3.6 3.6 4.0 3.3* 3.7  CL 105 104  --   --  101 102 103  CO2 23 22  --   --  23 26 26   GLUCOSE 124* 135*  --   --  147* 137* 139*  BUN 31* 22*  --   --  18 23* 23*  CREATININE 1.27* 1.12*  --   --  1.16* 1.31* 1.08*  CALCIUM 8.2* 8.5*  --   --  7.7* 7.5* 7.9*   Liver Function Tests: Recent Labs  Lab 08/06/17 1630 08/07/17 0845  AST 20  --   ALT 17  --   ALKPHOS 72  --  BILITOT 0.4  --   PROT 6.6  --   ALBUMIN 3.7 3.1*   No results for input(s): LIPASE, AMYLASE in the last 168 hours. No results for input(s): AMMONIA in the last 168 hours. CBC: Recent Labs  Lab 08/08/17 0001  08/08/17 1624 08/09/17 0639 08/10/17 0300 08/10/17 1409 08/11/17 0336  WBC 9.5  --   --  10.5 8.1 8.6 9.5  NEUTROABS 7.8*  --   --  8.6*  --   --   --   HGB 9.8*   < > 9.5* 8.2* 6.7* 8.0* 8.4*  HCT 29.9*   < > 28.0* 23.9* 20.8* 24.5* 25.6*  MCV 83.8  --   --  84.5 86.3 86.3 87.1  PLT 187  --   --  208 175 197 232   < > = values in this interval not displayed.   Cardiac Enzymes: Recent Labs  Lab 08/07/17 0006 08/07/17 0845 08/07/17 1320 08/08/17 0001 08/08/17 0553  TROPONINI <0.03 0.03* 0.03* 0.03* 0.03*   BNP: Invalid input(s): POCBNP CBG: No results for input(s): GLUCAP in the last 168 hours. D-Dimer No results for input(s): DDIMER in the last 72 hours. Hgb A1c No results for input(s): HGBA1C in the last 72 hours. Lipid Profile No results for input(s): CHOL, HDL, LDLCALC, TRIG, CHOLHDL, LDLDIRECT in the last 72 hours. Thyroid function studies No results for input(s): TSH, T4TOTAL, T3FREE, THYROIDAB in the last 72 hours.  Invalid input(s): FREET3 Anemia work up No results for input(s): VITAMINB12, FOLATE, FERRITIN, TIBC, IRON, RETICCTPCT in the last 72  hours. Urinalysis    Component Value Date/Time   COLORURINE YELLOW 08/07/2017 0431   APPEARANCEUR HAZY (A) 08/07/2017 0431   LABSPEC 1.012 08/07/2017 0431   PHURINE 9.0 (H) 08/07/2017 0431   GLUCOSEU NEGATIVE 08/07/2017 0431   HGBUR NEGATIVE 08/07/2017 0431   BILIRUBINUR NEGATIVE 08/07/2017 0431   KETONESUR NEGATIVE 08/07/2017 0431   PROTEINUR NEGATIVE 08/07/2017 0431   UROBILINOGEN 0.2 07/28/2014 1239   NITRITE POSITIVE (A) 08/07/2017 0431   LEUKOCYTESUR LARGE (A) 08/07/2017 0431   Sepsis Labs Invalid input(s): PROCALCITONIN,  WBC,  LACTICIDVEN   Time coordinating discharge: 35 minutes  SIGNED:  Marzetta Board, MD  [facility] 08/11/2017, 9:42 AM Pager [phone]  If 7PM-7AM, please contact night-coverage [email or web address] Password [email or web address]

## 2017-08-11 NOTE — Progress Notes (Signed)
Orthopaedic Trauma Progress Note  S: Doing well, no issues other than some diarrhea O:  Vitals:   08/10/17 1937 08/11/17 0500  BP: (!) 124/43 (!) 129/56  Pulse: 82 76  Resp: 19 17  Temp: 98.3 F (36.8 C) 98 F (36.7 C)  SpO2: 100% 100%   LLE: Incisions clean dry and intact, neuro intact at baseline  Imaging: Stable postoperative imaging  Labs:  CBC    Component Value Date/Time   WBC 9.5 08/11/2017 0336   RBC 2.94 (L) 08/11/2017 0336   HGB 8.4 (L) 08/11/2017 0336   HCT 25.6 (L) 08/11/2017 0336   PLT 232 08/11/2017 0336   MCV 87.1 08/11/2017 0336   MCH 28.6 08/11/2017 0336   MCHC 32.8 08/11/2017 0336   RDW 14.5 08/11/2017 0336   LYMPHSABS 0.7 08/09/2017 0639   MONOABS 1.2 (H) 08/09/2017 0639   EOSABS 0.0 08/09/2017 0639   BASOSABS 0.0 08/09/2017 06358    A/P: 84 year old female s/p ORIF of left periprosthetic distal femur fracture  Weightbearing: WBAT LLE Insicional and dressing care: Okay to leave open to air Orthopedic device(s): None Showering: Not yet VTE prophylaxis: Monitor hemoglobin will continue coumadin Pain control: Norco and robaxin Follow - up plan: 2 weeks  Shona Needles, MD [facility] [phone] (phone)

## 2017-08-11 NOTE — Progress Notes (Signed)
Pt medicated with pain medicine, IV removed, discharge instructions given to husband and PTAR. Husband left with all patient belongings. Patient left with transport. Report called to Amy at [facility].

## 2017-08-12 DIAGNOSIS — R300 Dysuria: Secondary | ICD-10-CM | POA: Diagnosis not present

## 2017-08-12 DIAGNOSIS — Z9181 History of falling: Secondary | ICD-10-CM | POA: Diagnosis not present

## 2017-08-12 DIAGNOSIS — I480 Paroxysmal atrial fibrillation: Secondary | ICD-10-CM | POA: Diagnosis not present

## 2017-08-12 DIAGNOSIS — M978XXA Periprosthetic fracture around other internal prosthetic joint, initial encounter: Secondary | ICD-10-CM | POA: Diagnosis not present

## 2017-08-12 DIAGNOSIS — Z96649 Presence of unspecified artificial hip joint: Secondary | ICD-10-CM | POA: Diagnosis not present

## 2017-08-12 DIAGNOSIS — R2689 Other abnormalities of gait and mobility: Secondary | ICD-10-CM | POA: Diagnosis not present

## 2017-08-16 DIAGNOSIS — M25562 Pain in left knee: Secondary | ICD-10-CM | POA: Diagnosis not present

## 2017-08-16 DIAGNOSIS — Z9181 History of falling: Secondary | ICD-10-CM | POA: Diagnosis not present

## 2017-08-16 DIAGNOSIS — R2689 Other abnormalities of gait and mobility: Secondary | ICD-10-CM | POA: Diagnosis not present

## 2017-08-18 DIAGNOSIS — M25562 Pain in left knee: Secondary | ICD-10-CM | POA: Diagnosis not present

## 2017-08-18 DIAGNOSIS — R2689 Other abnormalities of gait and mobility: Secondary | ICD-10-CM | POA: Diagnosis not present

## 2017-08-18 DIAGNOSIS — Z9181 History of falling: Secondary | ICD-10-CM | POA: Diagnosis not present

## 2017-08-23 DIAGNOSIS — Z9181 History of falling: Secondary | ICD-10-CM | POA: Diagnosis not present

## 2017-08-23 DIAGNOSIS — R2689 Other abnormalities of gait and mobility: Secondary | ICD-10-CM | POA: Diagnosis not present

## 2017-08-23 DIAGNOSIS — M978XXD Periprosthetic fracture around other internal prosthetic joint, subsequent encounter: Secondary | ICD-10-CM | POA: Diagnosis not present

## 2017-08-23 DIAGNOSIS — R600 Localized edema: Secondary | ICD-10-CM | POA: Diagnosis not present

## 2017-08-23 DIAGNOSIS — M25562 Pain in left knee: Secondary | ICD-10-CM | POA: Diagnosis not present

## 2017-08-25 DIAGNOSIS — R2689 Other abnormalities of gait and mobility: Secondary | ICD-10-CM | POA: Diagnosis not present

## 2017-08-25 DIAGNOSIS — M25562 Pain in left knee: Secondary | ICD-10-CM | POA: Diagnosis not present

## 2017-08-25 DIAGNOSIS — R600 Localized edema: Secondary | ICD-10-CM | POA: Diagnosis not present

## 2017-08-25 DIAGNOSIS — Z9181 History of falling: Secondary | ICD-10-CM | POA: Diagnosis not present

## 2017-08-27 DIAGNOSIS — R102 Pelvic and perineal pain: Secondary | ICD-10-CM | POA: Diagnosis not present

## 2017-08-27 DIAGNOSIS — I10 Essential (primary) hypertension: Secondary | ICD-10-CM | POA: Diagnosis not present

## 2017-08-27 DIAGNOSIS — R600 Localized edema: Secondary | ICD-10-CM | POA: Diagnosis not present

## 2017-08-27 DIAGNOSIS — S72402D Unspecified fracture of lower end of left femur, subsequent encounter for closed fracture with routine healing: Secondary | ICD-10-CM | POA: Diagnosis not present

## 2017-08-27 DIAGNOSIS — Z8673 Personal history of transient ischemic attack (TIA), and cerebral infarction without residual deficits: Secondary | ICD-10-CM | POA: Diagnosis not present

## 2017-08-27 DIAGNOSIS — D649 Anemia, unspecified: Secondary | ICD-10-CM | POA: Diagnosis not present

## 2017-08-27 DIAGNOSIS — M25562 Pain in left knee: Secondary | ICD-10-CM | POA: Diagnosis not present

## 2017-08-27 DIAGNOSIS — R2689 Other abnormalities of gait and mobility: Secondary | ICD-10-CM | POA: Diagnosis not present

## 2017-08-27 DIAGNOSIS — Z9181 History of falling: Secondary | ICD-10-CM | POA: Diagnosis not present

## 2017-08-27 DIAGNOSIS — M6281 Muscle weakness (generalized): Secondary | ICD-10-CM | POA: Diagnosis not present

## 2017-08-27 DIAGNOSIS — M25552 Pain in left hip: Secondary | ICD-10-CM | POA: Diagnosis not present

## 2017-08-27 DIAGNOSIS — Z96642 Presence of left artificial hip joint: Secondary | ICD-10-CM | POA: Diagnosis not present

## 2017-08-27 DIAGNOSIS — Z4789 Encounter for other orthopedic aftercare: Secondary | ICD-10-CM | POA: Diagnosis not present

## 2017-08-27 DIAGNOSIS — R29810 Facial weakness: Secondary | ICD-10-CM | POA: Diagnosis not present

## 2017-08-27 DIAGNOSIS — R350 Frequency of micturition: Secondary | ICD-10-CM | POA: Diagnosis not present

## 2017-08-27 DIAGNOSIS — M978XXS Periprosthetic fracture around other internal prosthetic joint, sequela: Secondary | ICD-10-CM | POA: Diagnosis not present

## 2017-08-27 DIAGNOSIS — R52 Pain, unspecified: Secondary | ICD-10-CM | POA: Diagnosis not present

## 2017-08-27 DIAGNOSIS — M978XXD Periprosthetic fracture around other internal prosthetic joint, subsequent encounter: Secondary | ICD-10-CM | POA: Diagnosis not present

## 2017-08-27 DIAGNOSIS — Z96649 Presence of unspecified artificial hip joint: Secondary | ICD-10-CM | POA: Diagnosis not present

## 2017-08-27 DIAGNOSIS — R41841 Cognitive communication deficit: Secondary | ICD-10-CM | POA: Diagnosis not present

## 2017-08-27 DIAGNOSIS — R300 Dysuria: Secondary | ICD-10-CM | POA: Diagnosis not present

## 2017-08-27 DIAGNOSIS — N390 Urinary tract infection, site not specified: Secondary | ICD-10-CM | POA: Diagnosis not present

## 2017-08-27 DIAGNOSIS — S72402S Unspecified fracture of lower end of left femur, sequela: Secondary | ICD-10-CM | POA: Diagnosis not present

## 2017-08-27 DIAGNOSIS — I480 Paroxysmal atrial fibrillation: Secondary | ICD-10-CM | POA: Diagnosis not present

## 2017-08-29 DIAGNOSIS — R2689 Other abnormalities of gait and mobility: Secondary | ICD-10-CM | POA: Diagnosis not present

## 2017-08-29 DIAGNOSIS — M25562 Pain in left knee: Secondary | ICD-10-CM | POA: Diagnosis not present

## 2017-08-29 DIAGNOSIS — Z9181 History of falling: Secondary | ICD-10-CM | POA: Diagnosis not present

## 2017-08-31 DIAGNOSIS — Z9181 History of falling: Secondary | ICD-10-CM | POA: Diagnosis not present

## 2017-08-31 DIAGNOSIS — M25552 Pain in left hip: Secondary | ICD-10-CM | POA: Diagnosis not present

## 2017-08-31 DIAGNOSIS — M25562 Pain in left knee: Secondary | ICD-10-CM | POA: Diagnosis not present

## 2017-08-31 DIAGNOSIS — R2689 Other abnormalities of gait and mobility: Secondary | ICD-10-CM | POA: Diagnosis not present

## 2017-09-02 DIAGNOSIS — Z9181 History of falling: Secondary | ICD-10-CM | POA: Diagnosis not present

## 2017-09-02 DIAGNOSIS — R2689 Other abnormalities of gait and mobility: Secondary | ICD-10-CM | POA: Diagnosis not present

## 2017-09-02 DIAGNOSIS — M25562 Pain in left knee: Secondary | ICD-10-CM | POA: Diagnosis not present

## 2017-09-02 DIAGNOSIS — M25552 Pain in left hip: Secondary | ICD-10-CM | POA: Diagnosis not present

## 2017-09-05 DIAGNOSIS — Z9181 History of falling: Secondary | ICD-10-CM | POA: Diagnosis not present

## 2017-09-05 DIAGNOSIS — R2689 Other abnormalities of gait and mobility: Secondary | ICD-10-CM | POA: Diagnosis not present

## 2017-09-05 DIAGNOSIS — M25552 Pain in left hip: Secondary | ICD-10-CM | POA: Diagnosis not present

## 2017-09-05 DIAGNOSIS — M25562 Pain in left knee: Secondary | ICD-10-CM | POA: Diagnosis not present

## 2017-09-06 DIAGNOSIS — R2689 Other abnormalities of gait and mobility: Secondary | ICD-10-CM | POA: Diagnosis not present

## 2017-09-06 DIAGNOSIS — M25552 Pain in left hip: Secondary | ICD-10-CM | POA: Diagnosis not present

## 2017-09-06 DIAGNOSIS — M25562 Pain in left knee: Secondary | ICD-10-CM | POA: Diagnosis not present

## 2017-09-06 DIAGNOSIS — Z9181 History of falling: Secondary | ICD-10-CM | POA: Diagnosis not present

## 2017-09-07 DIAGNOSIS — R2689 Other abnormalities of gait and mobility: Secondary | ICD-10-CM | POA: Diagnosis not present

## 2017-09-07 DIAGNOSIS — Z9181 History of falling: Secondary | ICD-10-CM | POA: Diagnosis not present

## 2017-09-07 DIAGNOSIS — M25562 Pain in left knee: Secondary | ICD-10-CM | POA: Diagnosis not present

## 2017-09-07 DIAGNOSIS — M25552 Pain in left hip: Secondary | ICD-10-CM | POA: Diagnosis not present

## 2017-09-09 DIAGNOSIS — R300 Dysuria: Secondary | ICD-10-CM | POA: Diagnosis not present

## 2017-09-09 DIAGNOSIS — M25562 Pain in left knee: Secondary | ICD-10-CM | POA: Diagnosis not present

## 2017-09-09 DIAGNOSIS — Z9181 History of falling: Secondary | ICD-10-CM | POA: Diagnosis not present

## 2017-09-09 DIAGNOSIS — R2689 Other abnormalities of gait and mobility: Secondary | ICD-10-CM | POA: Diagnosis not present

## 2017-09-09 DIAGNOSIS — I480 Paroxysmal atrial fibrillation: Secondary | ICD-10-CM | POA: Diagnosis not present

## 2017-09-13 DIAGNOSIS — Z9181 History of falling: Secondary | ICD-10-CM | POA: Diagnosis not present

## 2017-09-13 DIAGNOSIS — R2689 Other abnormalities of gait and mobility: Secondary | ICD-10-CM | POA: Diagnosis not present

## 2017-09-13 DIAGNOSIS — M25562 Pain in left knee: Secondary | ICD-10-CM | POA: Diagnosis not present

## 2017-09-13 DIAGNOSIS — R600 Localized edema: Secondary | ICD-10-CM | POA: Diagnosis not present

## 2017-09-15 DIAGNOSIS — R2689 Other abnormalities of gait and mobility: Secondary | ICD-10-CM | POA: Diagnosis not present

## 2017-09-15 DIAGNOSIS — M25562 Pain in left knee: Secondary | ICD-10-CM | POA: Diagnosis not present

## 2017-09-15 DIAGNOSIS — Z9181 History of falling: Secondary | ICD-10-CM | POA: Diagnosis not present

## 2017-09-19 DIAGNOSIS — R2689 Other abnormalities of gait and mobility: Secondary | ICD-10-CM | POA: Diagnosis not present

## 2017-09-19 DIAGNOSIS — Z9181 History of falling: Secondary | ICD-10-CM | POA: Diagnosis not present

## 2017-09-19 DIAGNOSIS — M25562 Pain in left knee: Secondary | ICD-10-CM | POA: Diagnosis not present

## 2017-09-20 DIAGNOSIS — M978XXD Periprosthetic fracture around other internal prosthetic joint, subsequent encounter: Secondary | ICD-10-CM | POA: Diagnosis not present

## 2017-09-21 DIAGNOSIS — R2689 Other abnormalities of gait and mobility: Secondary | ICD-10-CM | POA: Diagnosis not present

## 2017-09-21 DIAGNOSIS — Z9181 History of falling: Secondary | ICD-10-CM | POA: Diagnosis not present

## 2017-09-21 DIAGNOSIS — M25562 Pain in left knee: Secondary | ICD-10-CM | POA: Diagnosis not present

## 2017-09-26 DIAGNOSIS — I480 Paroxysmal atrial fibrillation: Secondary | ICD-10-CM | POA: Diagnosis not present

## 2017-09-27 DIAGNOSIS — M25562 Pain in left knee: Secondary | ICD-10-CM | POA: Diagnosis not present

## 2017-09-27 DIAGNOSIS — Z9181 History of falling: Secondary | ICD-10-CM | POA: Diagnosis not present

## 2017-09-27 DIAGNOSIS — R2689 Other abnormalities of gait and mobility: Secondary | ICD-10-CM | POA: Diagnosis not present

## 2017-09-29 DIAGNOSIS — I480 Paroxysmal atrial fibrillation: Secondary | ICD-10-CM | POA: Diagnosis not present

## 2017-09-30 DIAGNOSIS — Z9181 History of falling: Secondary | ICD-10-CM | POA: Diagnosis not present

## 2017-09-30 DIAGNOSIS — M25562 Pain in left knee: Secondary | ICD-10-CM | POA: Diagnosis not present

## 2017-09-30 DIAGNOSIS — R2689 Other abnormalities of gait and mobility: Secondary | ICD-10-CM | POA: Diagnosis not present

## 2017-10-03 DIAGNOSIS — I480 Paroxysmal atrial fibrillation: Secondary | ICD-10-CM | POA: Diagnosis not present

## 2017-10-03 DIAGNOSIS — R52 Pain, unspecified: Secondary | ICD-10-CM | POA: Diagnosis not present

## 2017-10-04 DIAGNOSIS — R300 Dysuria: Secondary | ICD-10-CM | POA: Diagnosis not present

## 2017-10-04 DIAGNOSIS — M25562 Pain in left knee: Secondary | ICD-10-CM | POA: Diagnosis not present

## 2017-10-04 DIAGNOSIS — R2689 Other abnormalities of gait and mobility: Secondary | ICD-10-CM | POA: Diagnosis not present

## 2017-10-04 DIAGNOSIS — Z9181 History of falling: Secondary | ICD-10-CM | POA: Diagnosis not present

## 2017-10-06 DIAGNOSIS — R2689 Other abnormalities of gait and mobility: Secondary | ICD-10-CM | POA: Diagnosis not present

## 2017-10-06 DIAGNOSIS — Z9181 History of falling: Secondary | ICD-10-CM | POA: Diagnosis not present

## 2017-10-06 DIAGNOSIS — M25562 Pain in left knee: Secondary | ICD-10-CM | POA: Diagnosis not present

## 2017-10-10 DIAGNOSIS — Z9181 History of falling: Secondary | ICD-10-CM | POA: Diagnosis not present

## 2017-10-10 DIAGNOSIS — R2689 Other abnormalities of gait and mobility: Secondary | ICD-10-CM | POA: Diagnosis not present

## 2017-10-10 DIAGNOSIS — M25562 Pain in left knee: Secondary | ICD-10-CM | POA: Diagnosis not present

## 2017-10-12 DIAGNOSIS — Z9181 History of falling: Secondary | ICD-10-CM | POA: Diagnosis not present

## 2017-10-12 DIAGNOSIS — R2689 Other abnormalities of gait and mobility: Secondary | ICD-10-CM | POA: Diagnosis not present

## 2017-10-12 DIAGNOSIS — M25562 Pain in left knee: Secondary | ICD-10-CM | POA: Diagnosis not present

## 2017-10-14 DIAGNOSIS — I10 Essential (primary) hypertension: Secondary | ICD-10-CM | POA: Diagnosis not present

## 2017-10-14 DIAGNOSIS — N390 Urinary tract infection, site not specified: Secondary | ICD-10-CM | POA: Diagnosis not present

## 2017-10-14 DIAGNOSIS — R350 Frequency of micturition: Secondary | ICD-10-CM | POA: Diagnosis not present

## 2017-10-14 DIAGNOSIS — R2689 Other abnormalities of gait and mobility: Secondary | ICD-10-CM | POA: Diagnosis not present

## 2017-10-14 DIAGNOSIS — Z9181 History of falling: Secondary | ICD-10-CM | POA: Diagnosis not present

## 2017-10-14 DIAGNOSIS — M25562 Pain in left knee: Secondary | ICD-10-CM | POA: Diagnosis not present

## 2017-10-14 DIAGNOSIS — D649 Anemia, unspecified: Secondary | ICD-10-CM | POA: Diagnosis not present

## 2017-10-18 DIAGNOSIS — Z9181 History of falling: Secondary | ICD-10-CM | POA: Diagnosis not present

## 2017-10-18 DIAGNOSIS — M25562 Pain in left knee: Secondary | ICD-10-CM | POA: Diagnosis not present

## 2017-10-18 DIAGNOSIS — R2689 Other abnormalities of gait and mobility: Secondary | ICD-10-CM | POA: Diagnosis not present

## 2017-10-18 DIAGNOSIS — R102 Pelvic and perineal pain: Secondary | ICD-10-CM | POA: Diagnosis not present

## 2017-10-24 DIAGNOSIS — Z96649 Presence of unspecified artificial hip joint: Secondary | ICD-10-CM | POA: Diagnosis not present

## 2017-10-24 DIAGNOSIS — I10 Essential (primary) hypertension: Secondary | ICD-10-CM | POA: Diagnosis not present

## 2017-10-24 DIAGNOSIS — I480 Paroxysmal atrial fibrillation: Secondary | ICD-10-CM | POA: Diagnosis not present

## 2017-10-24 DIAGNOSIS — R2689 Other abnormalities of gait and mobility: Secondary | ICD-10-CM | POA: Diagnosis not present

## 2017-10-24 DIAGNOSIS — D649 Anemia, unspecified: Secondary | ICD-10-CM | POA: Diagnosis not present

## 2017-10-24 DIAGNOSIS — M25562 Pain in left knee: Secondary | ICD-10-CM | POA: Diagnosis not present

## 2017-10-24 DIAGNOSIS — Z9181 History of falling: Secondary | ICD-10-CM | POA: Diagnosis not present

## 2017-10-27 DIAGNOSIS — I480 Paroxysmal atrial fibrillation: Secondary | ICD-10-CM | POA: Diagnosis not present

## 2017-10-27 DIAGNOSIS — E7849 Other hyperlipidemia: Secondary | ICD-10-CM | POA: Diagnosis not present

## 2017-10-27 DIAGNOSIS — Z7901 Long term (current) use of anticoagulants: Secondary | ICD-10-CM | POA: Diagnosis not present

## 2017-10-27 DIAGNOSIS — I10 Essential (primary) hypertension: Secondary | ICD-10-CM | POA: Diagnosis not present

## 2017-10-27 DIAGNOSIS — G4733 Obstructive sleep apnea (adult) (pediatric): Secondary | ICD-10-CM | POA: Diagnosis not present

## 2017-10-27 DIAGNOSIS — E876 Hypokalemia: Secondary | ICD-10-CM | POA: Diagnosis not present

## 2017-10-27 DIAGNOSIS — M96662 Fracture of femur following insertion of orthopedic implant, joint prosthesis, or bone plate, left leg: Secondary | ICD-10-CM | POA: Diagnosis not present

## 2017-10-27 DIAGNOSIS — R2681 Unsteadiness on feet: Secondary | ICD-10-CM | POA: Diagnosis not present

## 2017-10-27 DIAGNOSIS — M545 Low back pain: Secondary | ICD-10-CM | POA: Diagnosis not present

## 2017-10-28 DIAGNOSIS — I129 Hypertensive chronic kidney disease with stage 1 through stage 4 chronic kidney disease, or unspecified chronic kidney disease: Secondary | ICD-10-CM | POA: Diagnosis not present

## 2017-10-28 DIAGNOSIS — M9712XD Periprosthetic fracture around internal prosthetic left knee joint, subsequent encounter: Secondary | ICD-10-CM | POA: Diagnosis not present

## 2017-10-28 DIAGNOSIS — M80052D Age-related osteoporosis with current pathological fracture, left femur, subsequent encounter for fracture with routine healing: Secondary | ICD-10-CM | POA: Diagnosis not present

## 2017-10-28 DIAGNOSIS — K219 Gastro-esophageal reflux disease without esophagitis: Secondary | ICD-10-CM | POA: Diagnosis not present

## 2017-10-28 DIAGNOSIS — E785 Hyperlipidemia, unspecified: Secondary | ICD-10-CM | POA: Diagnosis not present

## 2017-10-28 DIAGNOSIS — I69354 Hemiplegia and hemiparesis following cerebral infarction affecting left non-dominant side: Secondary | ICD-10-CM | POA: Diagnosis not present

## 2017-10-28 DIAGNOSIS — D649 Anemia, unspecified: Secondary | ICD-10-CM | POA: Diagnosis not present

## 2017-10-28 DIAGNOSIS — I480 Paroxysmal atrial fibrillation: Secondary | ICD-10-CM | POA: Diagnosis not present

## 2017-10-28 DIAGNOSIS — N183 Chronic kidney disease, stage 3 (moderate): Secondary | ICD-10-CM | POA: Diagnosis not present

## 2017-10-28 DIAGNOSIS — W19XXXD Unspecified fall, subsequent encounter: Secondary | ICD-10-CM | POA: Diagnosis not present

## 2017-10-31 DIAGNOSIS — N183 Chronic kidney disease, stage 3 (moderate): Secondary | ICD-10-CM | POA: Diagnosis not present

## 2017-10-31 DIAGNOSIS — I69354 Hemiplegia and hemiparesis following cerebral infarction affecting left non-dominant side: Secondary | ICD-10-CM | POA: Diagnosis not present

## 2017-10-31 DIAGNOSIS — I129 Hypertensive chronic kidney disease with stage 1 through stage 4 chronic kidney disease, or unspecified chronic kidney disease: Secondary | ICD-10-CM | POA: Diagnosis not present

## 2017-10-31 DIAGNOSIS — M9712XD Periprosthetic fracture around internal prosthetic left knee joint, subsequent encounter: Secondary | ICD-10-CM | POA: Diagnosis not present

## 2017-10-31 DIAGNOSIS — I480 Paroxysmal atrial fibrillation: Secondary | ICD-10-CM | POA: Diagnosis not present

## 2017-10-31 DIAGNOSIS — M80052D Age-related osteoporosis with current pathological fracture, left femur, subsequent encounter for fracture with routine healing: Secondary | ICD-10-CM | POA: Diagnosis not present

## 2017-11-01 DIAGNOSIS — M978XXD Periprosthetic fracture around other internal prosthetic joint, subsequent encounter: Secondary | ICD-10-CM | POA: Diagnosis not present

## 2017-11-02 DIAGNOSIS — I129 Hypertensive chronic kidney disease with stage 1 through stage 4 chronic kidney disease, or unspecified chronic kidney disease: Secondary | ICD-10-CM | POA: Diagnosis not present

## 2017-11-02 DIAGNOSIS — M9712XD Periprosthetic fracture around internal prosthetic left knee joint, subsequent encounter: Secondary | ICD-10-CM | POA: Diagnosis not present

## 2017-11-02 DIAGNOSIS — N183 Chronic kidney disease, stage 3 (moderate): Secondary | ICD-10-CM | POA: Diagnosis not present

## 2017-11-02 DIAGNOSIS — I69354 Hemiplegia and hemiparesis following cerebral infarction affecting left non-dominant side: Secondary | ICD-10-CM | POA: Diagnosis not present

## 2017-11-02 DIAGNOSIS — M80052D Age-related osteoporosis with current pathological fracture, left femur, subsequent encounter for fracture with routine healing: Secondary | ICD-10-CM | POA: Diagnosis not present

## 2017-11-02 DIAGNOSIS — I480 Paroxysmal atrial fibrillation: Secondary | ICD-10-CM | POA: Diagnosis not present

## 2017-11-03 DIAGNOSIS — M9712XD Periprosthetic fracture around internal prosthetic left knee joint, subsequent encounter: Secondary | ICD-10-CM | POA: Diagnosis not present

## 2017-11-03 DIAGNOSIS — N183 Chronic kidney disease, stage 3 (moderate): Secondary | ICD-10-CM | POA: Diagnosis not present

## 2017-11-03 DIAGNOSIS — I69354 Hemiplegia and hemiparesis following cerebral infarction affecting left non-dominant side: Secondary | ICD-10-CM | POA: Diagnosis not present

## 2017-11-03 DIAGNOSIS — I129 Hypertensive chronic kidney disease with stage 1 through stage 4 chronic kidney disease, or unspecified chronic kidney disease: Secondary | ICD-10-CM | POA: Diagnosis not present

## 2017-11-03 DIAGNOSIS — I480 Paroxysmal atrial fibrillation: Secondary | ICD-10-CM | POA: Diagnosis not present

## 2017-11-03 DIAGNOSIS — M80052D Age-related osteoporosis with current pathological fracture, left femur, subsequent encounter for fracture with routine healing: Secondary | ICD-10-CM | POA: Diagnosis not present

## 2017-11-04 DIAGNOSIS — M80052D Age-related osteoporosis with current pathological fracture, left femur, subsequent encounter for fracture with routine healing: Secondary | ICD-10-CM | POA: Diagnosis not present

## 2017-11-04 DIAGNOSIS — I129 Hypertensive chronic kidney disease with stage 1 through stage 4 chronic kidney disease, or unspecified chronic kidney disease: Secondary | ICD-10-CM | POA: Diagnosis not present

## 2017-11-04 DIAGNOSIS — I480 Paroxysmal atrial fibrillation: Secondary | ICD-10-CM | POA: Diagnosis not present

## 2017-11-04 DIAGNOSIS — N183 Chronic kidney disease, stage 3 (moderate): Secondary | ICD-10-CM | POA: Diagnosis not present

## 2017-11-04 DIAGNOSIS — M9712XD Periprosthetic fracture around internal prosthetic left knee joint, subsequent encounter: Secondary | ICD-10-CM | POA: Diagnosis not present

## 2017-11-04 DIAGNOSIS — I69354 Hemiplegia and hemiparesis following cerebral infarction affecting left non-dominant side: Secondary | ICD-10-CM | POA: Diagnosis not present

## 2017-11-07 DIAGNOSIS — M47818 Spondylosis without myelopathy or radiculopathy, sacral and sacrococcygeal region: Secondary | ICD-10-CM | POA: Diagnosis not present

## 2017-11-07 DIAGNOSIS — M47816 Spondylosis without myelopathy or radiculopathy, lumbar region: Secondary | ICD-10-CM | POA: Diagnosis not present

## 2017-11-07 DIAGNOSIS — M4316 Spondylolisthesis, lumbar region: Secondary | ICD-10-CM | POA: Diagnosis not present

## 2017-11-07 DIAGNOSIS — S3210XA Unspecified fracture of sacrum, initial encounter for closed fracture: Secondary | ICD-10-CM | POA: Diagnosis not present

## 2017-11-07 DIAGNOSIS — M80052D Age-related osteoporosis with current pathological fracture, left femur, subsequent encounter for fracture with routine healing: Secondary | ICD-10-CM | POA: Diagnosis not present

## 2017-11-07 DIAGNOSIS — I480 Paroxysmal atrial fibrillation: Secondary | ICD-10-CM | POA: Diagnosis not present

## 2017-11-07 DIAGNOSIS — M5136 Other intervertebral disc degeneration, lumbar region: Secondary | ICD-10-CM | POA: Diagnosis not present

## 2017-11-07 DIAGNOSIS — I69354 Hemiplegia and hemiparesis following cerebral infarction affecting left non-dominant side: Secondary | ICD-10-CM | POA: Diagnosis not present

## 2017-11-07 DIAGNOSIS — N183 Chronic kidney disease, stage 3 (moderate): Secondary | ICD-10-CM | POA: Diagnosis not present

## 2017-11-07 DIAGNOSIS — M9712XD Periprosthetic fracture around internal prosthetic left knee joint, subsequent encounter: Secondary | ICD-10-CM | POA: Diagnosis not present

## 2017-11-07 DIAGNOSIS — I129 Hypertensive chronic kidney disease with stage 1 through stage 4 chronic kidney disease, or unspecified chronic kidney disease: Secondary | ICD-10-CM | POA: Diagnosis not present

## 2017-11-08 DIAGNOSIS — M9712XD Periprosthetic fracture around internal prosthetic left knee joint, subsequent encounter: Secondary | ICD-10-CM | POA: Diagnosis not present

## 2017-11-08 DIAGNOSIS — I480 Paroxysmal atrial fibrillation: Secondary | ICD-10-CM | POA: Diagnosis not present

## 2017-11-08 DIAGNOSIS — M80052D Age-related osteoporosis with current pathological fracture, left femur, subsequent encounter for fracture with routine healing: Secondary | ICD-10-CM | POA: Diagnosis not present

## 2017-11-08 DIAGNOSIS — N183 Chronic kidney disease, stage 3 (moderate): Secondary | ICD-10-CM | POA: Diagnosis not present

## 2017-11-08 DIAGNOSIS — I69354 Hemiplegia and hemiparesis following cerebral infarction affecting left non-dominant side: Secondary | ICD-10-CM | POA: Diagnosis not present

## 2017-11-08 DIAGNOSIS — I129 Hypertensive chronic kidney disease with stage 1 through stage 4 chronic kidney disease, or unspecified chronic kidney disease: Secondary | ICD-10-CM | POA: Diagnosis not present

## 2017-11-10 DIAGNOSIS — I69354 Hemiplegia and hemiparesis following cerebral infarction affecting left non-dominant side: Secondary | ICD-10-CM | POA: Diagnosis not present

## 2017-11-10 DIAGNOSIS — M80052D Age-related osteoporosis with current pathological fracture, left femur, subsequent encounter for fracture with routine healing: Secondary | ICD-10-CM | POA: Diagnosis not present

## 2017-11-10 DIAGNOSIS — I129 Hypertensive chronic kidney disease with stage 1 through stage 4 chronic kidney disease, or unspecified chronic kidney disease: Secondary | ICD-10-CM | POA: Diagnosis not present

## 2017-11-10 DIAGNOSIS — I480 Paroxysmal atrial fibrillation: Secondary | ICD-10-CM | POA: Diagnosis not present

## 2017-11-10 DIAGNOSIS — M9712XD Periprosthetic fracture around internal prosthetic left knee joint, subsequent encounter: Secondary | ICD-10-CM | POA: Diagnosis not present

## 2017-11-10 DIAGNOSIS — N183 Chronic kidney disease, stage 3 (moderate): Secondary | ICD-10-CM | POA: Diagnosis not present

## 2017-11-11 DIAGNOSIS — I480 Paroxysmal atrial fibrillation: Secondary | ICD-10-CM | POA: Diagnosis not present

## 2017-11-11 DIAGNOSIS — M9712XD Periprosthetic fracture around internal prosthetic left knee joint, subsequent encounter: Secondary | ICD-10-CM | POA: Diagnosis not present

## 2017-11-11 DIAGNOSIS — N183 Chronic kidney disease, stage 3 (moderate): Secondary | ICD-10-CM | POA: Diagnosis not present

## 2017-11-11 DIAGNOSIS — M80052D Age-related osteoporosis with current pathological fracture, left femur, subsequent encounter for fracture with routine healing: Secondary | ICD-10-CM | POA: Diagnosis not present

## 2017-11-11 DIAGNOSIS — I129 Hypertensive chronic kidney disease with stage 1 through stage 4 chronic kidney disease, or unspecified chronic kidney disease: Secondary | ICD-10-CM | POA: Diagnosis not present

## 2017-11-11 DIAGNOSIS — I69354 Hemiplegia and hemiparesis following cerebral infarction affecting left non-dominant side: Secondary | ICD-10-CM | POA: Diagnosis not present

## 2017-11-14 DIAGNOSIS — I480 Paroxysmal atrial fibrillation: Secondary | ICD-10-CM | POA: Diagnosis not present

## 2017-11-14 DIAGNOSIS — N183 Chronic kidney disease, stage 3 (moderate): Secondary | ICD-10-CM | POA: Diagnosis not present

## 2017-11-14 DIAGNOSIS — I69354 Hemiplegia and hemiparesis following cerebral infarction affecting left non-dominant side: Secondary | ICD-10-CM | POA: Diagnosis not present

## 2017-11-14 DIAGNOSIS — M9712XD Periprosthetic fracture around internal prosthetic left knee joint, subsequent encounter: Secondary | ICD-10-CM | POA: Diagnosis not present

## 2017-11-14 DIAGNOSIS — I129 Hypertensive chronic kidney disease with stage 1 through stage 4 chronic kidney disease, or unspecified chronic kidney disease: Secondary | ICD-10-CM | POA: Diagnosis not present

## 2017-11-14 DIAGNOSIS — M80052D Age-related osteoporosis with current pathological fracture, left femur, subsequent encounter for fracture with routine healing: Secondary | ICD-10-CM | POA: Diagnosis not present

## 2017-11-15 DIAGNOSIS — N183 Chronic kidney disease, stage 3 (moderate): Secondary | ICD-10-CM | POA: Diagnosis not present

## 2017-11-15 DIAGNOSIS — I69354 Hemiplegia and hemiparesis following cerebral infarction affecting left non-dominant side: Secondary | ICD-10-CM | POA: Diagnosis not present

## 2017-11-15 DIAGNOSIS — I480 Paroxysmal atrial fibrillation: Secondary | ICD-10-CM | POA: Diagnosis not present

## 2017-11-15 DIAGNOSIS — M80052D Age-related osteoporosis with current pathological fracture, left femur, subsequent encounter for fracture with routine healing: Secondary | ICD-10-CM | POA: Diagnosis not present

## 2017-11-15 DIAGNOSIS — I129 Hypertensive chronic kidney disease with stage 1 through stage 4 chronic kidney disease, or unspecified chronic kidney disease: Secondary | ICD-10-CM | POA: Diagnosis not present

## 2017-11-15 DIAGNOSIS — M9712XD Periprosthetic fracture around internal prosthetic left knee joint, subsequent encounter: Secondary | ICD-10-CM | POA: Diagnosis not present

## 2017-11-16 DIAGNOSIS — N183 Chronic kidney disease, stage 3 (moderate): Secondary | ICD-10-CM | POA: Diagnosis not present

## 2017-11-16 DIAGNOSIS — I129 Hypertensive chronic kidney disease with stage 1 through stage 4 chronic kidney disease, or unspecified chronic kidney disease: Secondary | ICD-10-CM | POA: Diagnosis not present

## 2017-11-16 DIAGNOSIS — M80052D Age-related osteoporosis with current pathological fracture, left femur, subsequent encounter for fracture with routine healing: Secondary | ICD-10-CM | POA: Diagnosis not present

## 2017-11-16 DIAGNOSIS — M9712XD Periprosthetic fracture around internal prosthetic left knee joint, subsequent encounter: Secondary | ICD-10-CM | POA: Diagnosis not present

## 2017-11-16 DIAGNOSIS — I69354 Hemiplegia and hemiparesis following cerebral infarction affecting left non-dominant side: Secondary | ICD-10-CM | POA: Diagnosis not present

## 2017-11-16 DIAGNOSIS — I480 Paroxysmal atrial fibrillation: Secondary | ICD-10-CM | POA: Diagnosis not present

## 2017-11-17 DIAGNOSIS — M9712XD Periprosthetic fracture around internal prosthetic left knee joint, subsequent encounter: Secondary | ICD-10-CM | POA: Diagnosis not present

## 2017-11-17 DIAGNOSIS — N183 Chronic kidney disease, stage 3 (moderate): Secondary | ICD-10-CM | POA: Diagnosis not present

## 2017-11-17 DIAGNOSIS — M80052D Age-related osteoporosis with current pathological fracture, left femur, subsequent encounter for fracture with routine healing: Secondary | ICD-10-CM | POA: Diagnosis not present

## 2017-11-17 DIAGNOSIS — I129 Hypertensive chronic kidney disease with stage 1 through stage 4 chronic kidney disease, or unspecified chronic kidney disease: Secondary | ICD-10-CM | POA: Diagnosis not present

## 2017-11-17 DIAGNOSIS — I69354 Hemiplegia and hemiparesis following cerebral infarction affecting left non-dominant side: Secondary | ICD-10-CM | POA: Diagnosis not present

## 2017-11-17 DIAGNOSIS — I480 Paroxysmal atrial fibrillation: Secondary | ICD-10-CM | POA: Diagnosis not present

## 2017-11-18 DIAGNOSIS — I480 Paroxysmal atrial fibrillation: Secondary | ICD-10-CM | POA: Diagnosis not present

## 2017-11-18 DIAGNOSIS — I69354 Hemiplegia and hemiparesis following cerebral infarction affecting left non-dominant side: Secondary | ICD-10-CM | POA: Diagnosis not present

## 2017-11-18 DIAGNOSIS — I129 Hypertensive chronic kidney disease with stage 1 through stage 4 chronic kidney disease, or unspecified chronic kidney disease: Secondary | ICD-10-CM | POA: Diagnosis not present

## 2017-11-18 DIAGNOSIS — N183 Chronic kidney disease, stage 3 (moderate): Secondary | ICD-10-CM | POA: Diagnosis not present

## 2017-11-18 DIAGNOSIS — M9712XD Periprosthetic fracture around internal prosthetic left knee joint, subsequent encounter: Secondary | ICD-10-CM | POA: Diagnosis not present

## 2017-11-18 DIAGNOSIS — M80052D Age-related osteoporosis with current pathological fracture, left femur, subsequent encounter for fracture with routine healing: Secondary | ICD-10-CM | POA: Diagnosis not present

## 2017-11-21 DIAGNOSIS — I69354 Hemiplegia and hemiparesis following cerebral infarction affecting left non-dominant side: Secondary | ICD-10-CM | POA: Diagnosis not present

## 2017-11-21 DIAGNOSIS — I129 Hypertensive chronic kidney disease with stage 1 through stage 4 chronic kidney disease, or unspecified chronic kidney disease: Secondary | ICD-10-CM | POA: Diagnosis not present

## 2017-11-21 DIAGNOSIS — N183 Chronic kidney disease, stage 3 (moderate): Secondary | ICD-10-CM | POA: Diagnosis not present

## 2017-11-21 DIAGNOSIS — M80052D Age-related osteoporosis with current pathological fracture, left femur, subsequent encounter for fracture with routine healing: Secondary | ICD-10-CM | POA: Diagnosis not present

## 2017-11-21 DIAGNOSIS — I480 Paroxysmal atrial fibrillation: Secondary | ICD-10-CM | POA: Diagnosis not present

## 2017-11-21 DIAGNOSIS — M9712XD Periprosthetic fracture around internal prosthetic left knee joint, subsequent encounter: Secondary | ICD-10-CM | POA: Diagnosis not present

## 2017-11-22 DIAGNOSIS — Z79891 Long term (current) use of opiate analgesic: Secondary | ICD-10-CM | POA: Diagnosis not present

## 2017-11-22 DIAGNOSIS — I480 Paroxysmal atrial fibrillation: Secondary | ICD-10-CM | POA: Diagnosis not present

## 2017-11-22 DIAGNOSIS — M533 Sacrococcygeal disorders, not elsewhere classified: Secondary | ICD-10-CM | POA: Diagnosis not present

## 2017-11-22 DIAGNOSIS — I129 Hypertensive chronic kidney disease with stage 1 through stage 4 chronic kidney disease, or unspecified chronic kidney disease: Secondary | ICD-10-CM | POA: Diagnosis not present

## 2017-11-22 DIAGNOSIS — M80052D Age-related osteoporosis with current pathological fracture, left femur, subsequent encounter for fracture with routine healing: Secondary | ICD-10-CM | POA: Diagnosis not present

## 2017-11-22 DIAGNOSIS — M79606 Pain in leg, unspecified: Secondary | ICD-10-CM | POA: Diagnosis not present

## 2017-11-22 DIAGNOSIS — N183 Chronic kidney disease, stage 3 (moderate): Secondary | ICD-10-CM | POA: Diagnosis not present

## 2017-11-22 DIAGNOSIS — I69354 Hemiplegia and hemiparesis following cerebral infarction affecting left non-dominant side: Secondary | ICD-10-CM | POA: Diagnosis not present

## 2017-11-22 DIAGNOSIS — G894 Chronic pain syndrome: Secondary | ICD-10-CM | POA: Diagnosis not present

## 2017-11-22 DIAGNOSIS — Z79899 Other long term (current) drug therapy: Secondary | ICD-10-CM | POA: Diagnosis not present

## 2017-11-22 DIAGNOSIS — M9712XD Periprosthetic fracture around internal prosthetic left knee joint, subsequent encounter: Secondary | ICD-10-CM | POA: Diagnosis not present

## 2017-11-23 DIAGNOSIS — M9712XD Periprosthetic fracture around internal prosthetic left knee joint, subsequent encounter: Secondary | ICD-10-CM | POA: Diagnosis not present

## 2017-11-23 DIAGNOSIS — N183 Chronic kidney disease, stage 3 (moderate): Secondary | ICD-10-CM | POA: Diagnosis not present

## 2017-11-23 DIAGNOSIS — I129 Hypertensive chronic kidney disease with stage 1 through stage 4 chronic kidney disease, or unspecified chronic kidney disease: Secondary | ICD-10-CM | POA: Diagnosis not present

## 2017-11-23 DIAGNOSIS — M80052D Age-related osteoporosis with current pathological fracture, left femur, subsequent encounter for fracture with routine healing: Secondary | ICD-10-CM | POA: Diagnosis not present

## 2017-11-23 DIAGNOSIS — I480 Paroxysmal atrial fibrillation: Secondary | ICD-10-CM | POA: Diagnosis not present

## 2017-11-23 DIAGNOSIS — I69354 Hemiplegia and hemiparesis following cerebral infarction affecting left non-dominant side: Secondary | ICD-10-CM | POA: Diagnosis not present

## 2017-11-24 DIAGNOSIS — Z7901 Long term (current) use of anticoagulants: Secondary | ICD-10-CM | POA: Diagnosis not present

## 2017-11-24 DIAGNOSIS — M80052D Age-related osteoporosis with current pathological fracture, left femur, subsequent encounter for fracture with routine healing: Secondary | ICD-10-CM | POA: Diagnosis not present

## 2017-11-24 DIAGNOSIS — N183 Chronic kidney disease, stage 3 (moderate): Secondary | ICD-10-CM | POA: Diagnosis not present

## 2017-11-24 DIAGNOSIS — M9712XD Periprosthetic fracture around internal prosthetic left knee joint, subsequent encounter: Secondary | ICD-10-CM | POA: Diagnosis not present

## 2017-11-24 DIAGNOSIS — I480 Paroxysmal atrial fibrillation: Secondary | ICD-10-CM | POA: Diagnosis not present

## 2017-11-24 DIAGNOSIS — I69354 Hemiplegia and hemiparesis following cerebral infarction affecting left non-dominant side: Secondary | ICD-10-CM | POA: Diagnosis not present

## 2017-11-24 DIAGNOSIS — I129 Hypertensive chronic kidney disease with stage 1 through stage 4 chronic kidney disease, or unspecified chronic kidney disease: Secondary | ICD-10-CM | POA: Diagnosis not present

## 2017-11-25 DIAGNOSIS — M80052D Age-related osteoporosis with current pathological fracture, left femur, subsequent encounter for fracture with routine healing: Secondary | ICD-10-CM | POA: Diagnosis not present

## 2017-11-25 DIAGNOSIS — M9712XD Periprosthetic fracture around internal prosthetic left knee joint, subsequent encounter: Secondary | ICD-10-CM | POA: Diagnosis not present

## 2017-11-25 DIAGNOSIS — I69354 Hemiplegia and hemiparesis following cerebral infarction affecting left non-dominant side: Secondary | ICD-10-CM | POA: Diagnosis not present

## 2017-11-25 DIAGNOSIS — N183 Chronic kidney disease, stage 3 (moderate): Secondary | ICD-10-CM | POA: Diagnosis not present

## 2017-11-25 DIAGNOSIS — I129 Hypertensive chronic kidney disease with stage 1 through stage 4 chronic kidney disease, or unspecified chronic kidney disease: Secondary | ICD-10-CM | POA: Diagnosis not present

## 2017-11-25 DIAGNOSIS — I480 Paroxysmal atrial fibrillation: Secondary | ICD-10-CM | POA: Diagnosis not present

## 2017-11-29 DIAGNOSIS — I480 Paroxysmal atrial fibrillation: Secondary | ICD-10-CM | POA: Diagnosis not present

## 2017-11-29 DIAGNOSIS — M9712XD Periprosthetic fracture around internal prosthetic left knee joint, subsequent encounter: Secondary | ICD-10-CM | POA: Diagnosis not present

## 2017-11-29 DIAGNOSIS — M80052D Age-related osteoporosis with current pathological fracture, left femur, subsequent encounter for fracture with routine healing: Secondary | ICD-10-CM | POA: Diagnosis not present

## 2017-11-29 DIAGNOSIS — I69354 Hemiplegia and hemiparesis following cerebral infarction affecting left non-dominant side: Secondary | ICD-10-CM | POA: Diagnosis not present

## 2017-11-29 DIAGNOSIS — N183 Chronic kidney disease, stage 3 (moderate): Secondary | ICD-10-CM | POA: Diagnosis not present

## 2017-11-29 DIAGNOSIS — I129 Hypertensive chronic kidney disease with stage 1 through stage 4 chronic kidney disease, or unspecified chronic kidney disease: Secondary | ICD-10-CM | POA: Diagnosis not present

## 2017-11-30 DIAGNOSIS — M9712XD Periprosthetic fracture around internal prosthetic left knee joint, subsequent encounter: Secondary | ICD-10-CM | POA: Diagnosis not present

## 2017-11-30 DIAGNOSIS — I480 Paroxysmal atrial fibrillation: Secondary | ICD-10-CM | POA: Diagnosis not present

## 2017-11-30 DIAGNOSIS — I69354 Hemiplegia and hemiparesis following cerebral infarction affecting left non-dominant side: Secondary | ICD-10-CM | POA: Diagnosis not present

## 2017-11-30 DIAGNOSIS — N183 Chronic kidney disease, stage 3 (moderate): Secondary | ICD-10-CM | POA: Diagnosis not present

## 2017-11-30 DIAGNOSIS — I129 Hypertensive chronic kidney disease with stage 1 through stage 4 chronic kidney disease, or unspecified chronic kidney disease: Secondary | ICD-10-CM | POA: Diagnosis not present

## 2017-11-30 DIAGNOSIS — M80052D Age-related osteoporosis with current pathological fracture, left femur, subsequent encounter for fracture with routine healing: Secondary | ICD-10-CM | POA: Diagnosis not present

## 2017-12-01 DIAGNOSIS — I69354 Hemiplegia and hemiparesis following cerebral infarction affecting left non-dominant side: Secondary | ICD-10-CM | POA: Diagnosis not present

## 2017-12-01 DIAGNOSIS — I480 Paroxysmal atrial fibrillation: Secondary | ICD-10-CM | POA: Diagnosis not present

## 2017-12-01 DIAGNOSIS — M9712XD Periprosthetic fracture around internal prosthetic left knee joint, subsequent encounter: Secondary | ICD-10-CM | POA: Diagnosis not present

## 2017-12-01 DIAGNOSIS — M80052D Age-related osteoporosis with current pathological fracture, left femur, subsequent encounter for fracture with routine healing: Secondary | ICD-10-CM | POA: Diagnosis not present

## 2017-12-01 DIAGNOSIS — I129 Hypertensive chronic kidney disease with stage 1 through stage 4 chronic kidney disease, or unspecified chronic kidney disease: Secondary | ICD-10-CM | POA: Diagnosis not present

## 2017-12-01 DIAGNOSIS — N183 Chronic kidney disease, stage 3 (moderate): Secondary | ICD-10-CM | POA: Diagnosis not present

## 2017-12-02 DIAGNOSIS — I480 Paroxysmal atrial fibrillation: Secondary | ICD-10-CM | POA: Diagnosis not present

## 2017-12-02 DIAGNOSIS — M80052D Age-related osteoporosis with current pathological fracture, left femur, subsequent encounter for fracture with routine healing: Secondary | ICD-10-CM | POA: Diagnosis not present

## 2017-12-02 DIAGNOSIS — M9712XD Periprosthetic fracture around internal prosthetic left knee joint, subsequent encounter: Secondary | ICD-10-CM | POA: Diagnosis not present

## 2017-12-02 DIAGNOSIS — I129 Hypertensive chronic kidney disease with stage 1 through stage 4 chronic kidney disease, or unspecified chronic kidney disease: Secondary | ICD-10-CM | POA: Diagnosis not present

## 2017-12-02 DIAGNOSIS — N183 Chronic kidney disease, stage 3 (moderate): Secondary | ICD-10-CM | POA: Diagnosis not present

## 2017-12-02 DIAGNOSIS — I69354 Hemiplegia and hemiparesis following cerebral infarction affecting left non-dominant side: Secondary | ICD-10-CM | POA: Diagnosis not present

## 2017-12-05 DIAGNOSIS — N183 Chronic kidney disease, stage 3 (moderate): Secondary | ICD-10-CM | POA: Diagnosis not present

## 2017-12-05 DIAGNOSIS — M80052D Age-related osteoporosis with current pathological fracture, left femur, subsequent encounter for fracture with routine healing: Secondary | ICD-10-CM | POA: Diagnosis not present

## 2017-12-05 DIAGNOSIS — M9712XD Periprosthetic fracture around internal prosthetic left knee joint, subsequent encounter: Secondary | ICD-10-CM | POA: Diagnosis not present

## 2017-12-05 DIAGNOSIS — I129 Hypertensive chronic kidney disease with stage 1 through stage 4 chronic kidney disease, or unspecified chronic kidney disease: Secondary | ICD-10-CM | POA: Diagnosis not present

## 2017-12-05 DIAGNOSIS — I69354 Hemiplegia and hemiparesis following cerebral infarction affecting left non-dominant side: Secondary | ICD-10-CM | POA: Diagnosis not present

## 2017-12-05 DIAGNOSIS — I480 Paroxysmal atrial fibrillation: Secondary | ICD-10-CM | POA: Diagnosis not present

## 2017-12-06 DIAGNOSIS — I480 Paroxysmal atrial fibrillation: Secondary | ICD-10-CM | POA: Diagnosis not present

## 2017-12-06 DIAGNOSIS — I69354 Hemiplegia and hemiparesis following cerebral infarction affecting left non-dominant side: Secondary | ICD-10-CM | POA: Diagnosis not present

## 2017-12-06 DIAGNOSIS — I129 Hypertensive chronic kidney disease with stage 1 through stage 4 chronic kidney disease, or unspecified chronic kidney disease: Secondary | ICD-10-CM | POA: Diagnosis not present

## 2017-12-06 DIAGNOSIS — M9712XD Periprosthetic fracture around internal prosthetic left knee joint, subsequent encounter: Secondary | ICD-10-CM | POA: Diagnosis not present

## 2017-12-06 DIAGNOSIS — M80052D Age-related osteoporosis with current pathological fracture, left femur, subsequent encounter for fracture with routine healing: Secondary | ICD-10-CM | POA: Diagnosis not present

## 2017-12-06 DIAGNOSIS — N183 Chronic kidney disease, stage 3 (moderate): Secondary | ICD-10-CM | POA: Diagnosis not present

## 2017-12-08 DIAGNOSIS — I129 Hypertensive chronic kidney disease with stage 1 through stage 4 chronic kidney disease, or unspecified chronic kidney disease: Secondary | ICD-10-CM | POA: Diagnosis not present

## 2017-12-08 DIAGNOSIS — N183 Chronic kidney disease, stage 3 (moderate): Secondary | ICD-10-CM | POA: Diagnosis not present

## 2017-12-08 DIAGNOSIS — I480 Paroxysmal atrial fibrillation: Secondary | ICD-10-CM | POA: Diagnosis not present

## 2017-12-08 DIAGNOSIS — M9712XD Periprosthetic fracture around internal prosthetic left knee joint, subsequent encounter: Secondary | ICD-10-CM | POA: Diagnosis not present

## 2017-12-08 DIAGNOSIS — M80052D Age-related osteoporosis with current pathological fracture, left femur, subsequent encounter for fracture with routine healing: Secondary | ICD-10-CM | POA: Diagnosis not present

## 2017-12-08 DIAGNOSIS — I69354 Hemiplegia and hemiparesis following cerebral infarction affecting left non-dominant side: Secondary | ICD-10-CM | POA: Diagnosis not present

## 2017-12-09 DIAGNOSIS — I480 Paroxysmal atrial fibrillation: Secondary | ICD-10-CM | POA: Diagnosis not present

## 2017-12-09 DIAGNOSIS — M80052D Age-related osteoporosis with current pathological fracture, left femur, subsequent encounter for fracture with routine healing: Secondary | ICD-10-CM | POA: Diagnosis not present

## 2017-12-09 DIAGNOSIS — R399 Unspecified symptoms and signs involving the genitourinary system: Secondary | ICD-10-CM | POA: Diagnosis not present

## 2017-12-09 DIAGNOSIS — N183 Chronic kidney disease, stage 3 (moderate): Secondary | ICD-10-CM | POA: Diagnosis not present

## 2017-12-09 DIAGNOSIS — M9712XD Periprosthetic fracture around internal prosthetic left knee joint, subsequent encounter: Secondary | ICD-10-CM | POA: Diagnosis not present

## 2017-12-09 DIAGNOSIS — I69354 Hemiplegia and hemiparesis following cerebral infarction affecting left non-dominant side: Secondary | ICD-10-CM | POA: Diagnosis not present

## 2017-12-09 DIAGNOSIS — I129 Hypertensive chronic kidney disease with stage 1 through stage 4 chronic kidney disease, or unspecified chronic kidney disease: Secondary | ICD-10-CM | POA: Diagnosis not present

## 2017-12-13 DIAGNOSIS — I480 Paroxysmal atrial fibrillation: Secondary | ICD-10-CM | POA: Diagnosis not present

## 2017-12-13 DIAGNOSIS — M80052D Age-related osteoporosis with current pathological fracture, left femur, subsequent encounter for fracture with routine healing: Secondary | ICD-10-CM | POA: Diagnosis not present

## 2017-12-13 DIAGNOSIS — I129 Hypertensive chronic kidney disease with stage 1 through stage 4 chronic kidney disease, or unspecified chronic kidney disease: Secondary | ICD-10-CM | POA: Diagnosis not present

## 2017-12-13 DIAGNOSIS — N183 Chronic kidney disease, stage 3 (moderate): Secondary | ICD-10-CM | POA: Diagnosis not present

## 2017-12-13 DIAGNOSIS — I69354 Hemiplegia and hemiparesis following cerebral infarction affecting left non-dominant side: Secondary | ICD-10-CM | POA: Diagnosis not present

## 2017-12-13 DIAGNOSIS — M9712XD Periprosthetic fracture around internal prosthetic left knee joint, subsequent encounter: Secondary | ICD-10-CM | POA: Diagnosis not present

## 2017-12-14 DIAGNOSIS — I69354 Hemiplegia and hemiparesis following cerebral infarction affecting left non-dominant side: Secondary | ICD-10-CM | POA: Diagnosis not present

## 2017-12-14 DIAGNOSIS — N183 Chronic kidney disease, stage 3 (moderate): Secondary | ICD-10-CM | POA: Diagnosis not present

## 2017-12-14 DIAGNOSIS — Z7901 Long term (current) use of anticoagulants: Secondary | ICD-10-CM | POA: Diagnosis not present

## 2017-12-14 DIAGNOSIS — M80052D Age-related osteoporosis with current pathological fracture, left femur, subsequent encounter for fracture with routine healing: Secondary | ICD-10-CM | POA: Diagnosis not present

## 2017-12-14 DIAGNOSIS — I129 Hypertensive chronic kidney disease with stage 1 through stage 4 chronic kidney disease, or unspecified chronic kidney disease: Secondary | ICD-10-CM | POA: Diagnosis not present

## 2017-12-14 DIAGNOSIS — I480 Paroxysmal atrial fibrillation: Secondary | ICD-10-CM | POA: Diagnosis not present

## 2017-12-14 DIAGNOSIS — M9712XD Periprosthetic fracture around internal prosthetic left knee joint, subsequent encounter: Secondary | ICD-10-CM | POA: Diagnosis not present

## 2017-12-15 DIAGNOSIS — I480 Paroxysmal atrial fibrillation: Secondary | ICD-10-CM | POA: Diagnosis not present

## 2017-12-15 DIAGNOSIS — M80052D Age-related osteoporosis with current pathological fracture, left femur, subsequent encounter for fracture with routine healing: Secondary | ICD-10-CM | POA: Diagnosis not present

## 2017-12-15 DIAGNOSIS — I69354 Hemiplegia and hemiparesis following cerebral infarction affecting left non-dominant side: Secondary | ICD-10-CM | POA: Diagnosis not present

## 2017-12-15 DIAGNOSIS — M9712XD Periprosthetic fracture around internal prosthetic left knee joint, subsequent encounter: Secondary | ICD-10-CM | POA: Diagnosis not present

## 2017-12-15 DIAGNOSIS — I129 Hypertensive chronic kidney disease with stage 1 through stage 4 chronic kidney disease, or unspecified chronic kidney disease: Secondary | ICD-10-CM | POA: Diagnosis not present

## 2017-12-15 DIAGNOSIS — N183 Chronic kidney disease, stage 3 (moderate): Secondary | ICD-10-CM | POA: Diagnosis not present

## 2017-12-16 DIAGNOSIS — N183 Chronic kidney disease, stage 3 (moderate): Secondary | ICD-10-CM | POA: Diagnosis not present

## 2017-12-16 DIAGNOSIS — M9712XD Periprosthetic fracture around internal prosthetic left knee joint, subsequent encounter: Secondary | ICD-10-CM | POA: Diagnosis not present

## 2017-12-16 DIAGNOSIS — I69354 Hemiplegia and hemiparesis following cerebral infarction affecting left non-dominant side: Secondary | ICD-10-CM | POA: Diagnosis not present

## 2017-12-16 DIAGNOSIS — M80052D Age-related osteoporosis with current pathological fracture, left femur, subsequent encounter for fracture with routine healing: Secondary | ICD-10-CM | POA: Diagnosis not present

## 2017-12-16 DIAGNOSIS — I480 Paroxysmal atrial fibrillation: Secondary | ICD-10-CM | POA: Diagnosis not present

## 2017-12-16 DIAGNOSIS — I129 Hypertensive chronic kidney disease with stage 1 through stage 4 chronic kidney disease, or unspecified chronic kidney disease: Secondary | ICD-10-CM | POA: Diagnosis not present

## 2017-12-19 DIAGNOSIS — M80052D Age-related osteoporosis with current pathological fracture, left femur, subsequent encounter for fracture with routine healing: Secondary | ICD-10-CM | POA: Diagnosis not present

## 2017-12-19 DIAGNOSIS — I480 Paroxysmal atrial fibrillation: Secondary | ICD-10-CM | POA: Diagnosis not present

## 2017-12-19 DIAGNOSIS — I129 Hypertensive chronic kidney disease with stage 1 through stage 4 chronic kidney disease, or unspecified chronic kidney disease: Secondary | ICD-10-CM | POA: Diagnosis not present

## 2017-12-19 DIAGNOSIS — M9712XD Periprosthetic fracture around internal prosthetic left knee joint, subsequent encounter: Secondary | ICD-10-CM | POA: Diagnosis not present

## 2017-12-19 DIAGNOSIS — N183 Chronic kidney disease, stage 3 (moderate): Secondary | ICD-10-CM | POA: Diagnosis not present

## 2017-12-19 DIAGNOSIS — I69354 Hemiplegia and hemiparesis following cerebral infarction affecting left non-dominant side: Secondary | ICD-10-CM | POA: Diagnosis not present

## 2017-12-20 DIAGNOSIS — M79606 Pain in leg, unspecified: Secondary | ICD-10-CM | POA: Diagnosis not present

## 2017-12-20 DIAGNOSIS — I129 Hypertensive chronic kidney disease with stage 1 through stage 4 chronic kidney disease, or unspecified chronic kidney disease: Secondary | ICD-10-CM | POA: Diagnosis not present

## 2017-12-20 DIAGNOSIS — M80052D Age-related osteoporosis with current pathological fracture, left femur, subsequent encounter for fracture with routine healing: Secondary | ICD-10-CM | POA: Diagnosis not present

## 2017-12-20 DIAGNOSIS — N183 Chronic kidney disease, stage 3 (moderate): Secondary | ICD-10-CM | POA: Diagnosis not present

## 2017-12-20 DIAGNOSIS — M533 Sacrococcygeal disorders, not elsewhere classified: Secondary | ICD-10-CM | POA: Diagnosis not present

## 2017-12-20 DIAGNOSIS — I480 Paroxysmal atrial fibrillation: Secondary | ICD-10-CM | POA: Diagnosis not present

## 2017-12-20 DIAGNOSIS — M9712XD Periprosthetic fracture around internal prosthetic left knee joint, subsequent encounter: Secondary | ICD-10-CM | POA: Diagnosis not present

## 2017-12-20 DIAGNOSIS — G894 Chronic pain syndrome: Secondary | ICD-10-CM | POA: Diagnosis not present

## 2017-12-20 DIAGNOSIS — I69354 Hemiplegia and hemiparesis following cerebral infarction affecting left non-dominant side: Secondary | ICD-10-CM | POA: Diagnosis not present

## 2017-12-21 DIAGNOSIS — M9712XD Periprosthetic fracture around internal prosthetic left knee joint, subsequent encounter: Secondary | ICD-10-CM | POA: Diagnosis not present

## 2017-12-21 DIAGNOSIS — I480 Paroxysmal atrial fibrillation: Secondary | ICD-10-CM | POA: Diagnosis not present

## 2017-12-21 DIAGNOSIS — N183 Chronic kidney disease, stage 3 (moderate): Secondary | ICD-10-CM | POA: Diagnosis not present

## 2017-12-21 DIAGNOSIS — M80052D Age-related osteoporosis with current pathological fracture, left femur, subsequent encounter for fracture with routine healing: Secondary | ICD-10-CM | POA: Diagnosis not present

## 2017-12-21 DIAGNOSIS — I129 Hypertensive chronic kidney disease with stage 1 through stage 4 chronic kidney disease, or unspecified chronic kidney disease: Secondary | ICD-10-CM | POA: Diagnosis not present

## 2017-12-21 DIAGNOSIS — I69354 Hemiplegia and hemiparesis following cerebral infarction affecting left non-dominant side: Secondary | ICD-10-CM | POA: Diagnosis not present

## 2017-12-22 DIAGNOSIS — M80052D Age-related osteoporosis with current pathological fracture, left femur, subsequent encounter for fracture with routine healing: Secondary | ICD-10-CM | POA: Diagnosis not present

## 2017-12-22 DIAGNOSIS — I129 Hypertensive chronic kidney disease with stage 1 through stage 4 chronic kidney disease, or unspecified chronic kidney disease: Secondary | ICD-10-CM | POA: Diagnosis not present

## 2017-12-22 DIAGNOSIS — N183 Chronic kidney disease, stage 3 (moderate): Secondary | ICD-10-CM | POA: Diagnosis not present

## 2017-12-22 DIAGNOSIS — M9712XD Periprosthetic fracture around internal prosthetic left knee joint, subsequent encounter: Secondary | ICD-10-CM | POA: Diagnosis not present

## 2017-12-22 DIAGNOSIS — I69354 Hemiplegia and hemiparesis following cerebral infarction affecting left non-dominant side: Secondary | ICD-10-CM | POA: Diagnosis not present

## 2017-12-22 DIAGNOSIS — I480 Paroxysmal atrial fibrillation: Secondary | ICD-10-CM | POA: Diagnosis not present

## 2017-12-26 DIAGNOSIS — H906 Mixed conductive and sensorineural hearing loss, bilateral: Secondary | ICD-10-CM | POA: Diagnosis not present

## 2017-12-27 DIAGNOSIS — M80052D Age-related osteoporosis with current pathological fracture, left femur, subsequent encounter for fracture with routine healing: Secondary | ICD-10-CM | POA: Diagnosis not present

## 2017-12-27 DIAGNOSIS — D649 Anemia, unspecified: Secondary | ICD-10-CM | POA: Diagnosis not present

## 2017-12-27 DIAGNOSIS — W19XXXD Unspecified fall, subsequent encounter: Secondary | ICD-10-CM | POA: Diagnosis not present

## 2017-12-27 DIAGNOSIS — I480 Paroxysmal atrial fibrillation: Secondary | ICD-10-CM | POA: Diagnosis not present

## 2017-12-27 DIAGNOSIS — I129 Hypertensive chronic kidney disease with stage 1 through stage 4 chronic kidney disease, or unspecified chronic kidney disease: Secondary | ICD-10-CM | POA: Diagnosis not present

## 2017-12-27 DIAGNOSIS — K219 Gastro-esophageal reflux disease without esophagitis: Secondary | ICD-10-CM | POA: Diagnosis not present

## 2017-12-27 DIAGNOSIS — D6489 Other specified anemias: Secondary | ICD-10-CM | POA: Diagnosis not present

## 2017-12-27 DIAGNOSIS — N183 Chronic kidney disease, stage 3 (moderate): Secondary | ICD-10-CM | POA: Diagnosis not present

## 2017-12-27 DIAGNOSIS — M9712XD Periprosthetic fracture around internal prosthetic left knee joint, subsequent encounter: Secondary | ICD-10-CM | POA: Diagnosis not present

## 2017-12-27 DIAGNOSIS — E785 Hyperlipidemia, unspecified: Secondary | ICD-10-CM | POA: Diagnosis not present

## 2017-12-27 DIAGNOSIS — I69354 Hemiplegia and hemiparesis following cerebral infarction affecting left non-dominant side: Secondary | ICD-10-CM | POA: Diagnosis not present

## 2017-12-27 DIAGNOSIS — E7849 Other hyperlipidemia: Secondary | ICD-10-CM | POA: Diagnosis not present

## 2017-12-28 DIAGNOSIS — M80052D Age-related osteoporosis with current pathological fracture, left femur, subsequent encounter for fracture with routine healing: Secondary | ICD-10-CM | POA: Diagnosis not present

## 2017-12-28 DIAGNOSIS — I129 Hypertensive chronic kidney disease with stage 1 through stage 4 chronic kidney disease, or unspecified chronic kidney disease: Secondary | ICD-10-CM | POA: Diagnosis not present

## 2017-12-28 DIAGNOSIS — I69354 Hemiplegia and hemiparesis following cerebral infarction affecting left non-dominant side: Secondary | ICD-10-CM | POA: Diagnosis not present

## 2017-12-28 DIAGNOSIS — M9712XD Periprosthetic fracture around internal prosthetic left knee joint, subsequent encounter: Secondary | ICD-10-CM | POA: Diagnosis not present

## 2017-12-28 DIAGNOSIS — N183 Chronic kidney disease, stage 3 (moderate): Secondary | ICD-10-CM | POA: Diagnosis not present

## 2017-12-28 DIAGNOSIS — I480 Paroxysmal atrial fibrillation: Secondary | ICD-10-CM | POA: Diagnosis not present

## 2017-12-29 DIAGNOSIS — I69354 Hemiplegia and hemiparesis following cerebral infarction affecting left non-dominant side: Secondary | ICD-10-CM | POA: Diagnosis not present

## 2017-12-29 DIAGNOSIS — I129 Hypertensive chronic kidney disease with stage 1 through stage 4 chronic kidney disease, or unspecified chronic kidney disease: Secondary | ICD-10-CM | POA: Diagnosis not present

## 2017-12-29 DIAGNOSIS — M80052D Age-related osteoporosis with current pathological fracture, left femur, subsequent encounter for fracture with routine healing: Secondary | ICD-10-CM | POA: Diagnosis not present

## 2017-12-29 DIAGNOSIS — N183 Chronic kidney disease, stage 3 (moderate): Secondary | ICD-10-CM | POA: Diagnosis not present

## 2017-12-29 DIAGNOSIS — I480 Paroxysmal atrial fibrillation: Secondary | ICD-10-CM | POA: Diagnosis not present

## 2017-12-29 DIAGNOSIS — M9712XD Periprosthetic fracture around internal prosthetic left knee joint, subsequent encounter: Secondary | ICD-10-CM | POA: Diagnosis not present

## 2017-12-30 DIAGNOSIS — M80052D Age-related osteoporosis with current pathological fracture, left femur, subsequent encounter for fracture with routine healing: Secondary | ICD-10-CM | POA: Diagnosis not present

## 2017-12-30 DIAGNOSIS — N183 Chronic kidney disease, stage 3 (moderate): Secondary | ICD-10-CM | POA: Diagnosis not present

## 2017-12-30 DIAGNOSIS — M9712XD Periprosthetic fracture around internal prosthetic left knee joint, subsequent encounter: Secondary | ICD-10-CM | POA: Diagnosis not present

## 2017-12-30 DIAGNOSIS — I69354 Hemiplegia and hemiparesis following cerebral infarction affecting left non-dominant side: Secondary | ICD-10-CM | POA: Diagnosis not present

## 2017-12-30 DIAGNOSIS — I129 Hypertensive chronic kidney disease with stage 1 through stage 4 chronic kidney disease, or unspecified chronic kidney disease: Secondary | ICD-10-CM | POA: Diagnosis not present

## 2017-12-30 DIAGNOSIS — I480 Paroxysmal atrial fibrillation: Secondary | ICD-10-CM | POA: Diagnosis not present

## 2018-01-02 DIAGNOSIS — M80052D Age-related osteoporosis with current pathological fracture, left femur, subsequent encounter for fracture with routine healing: Secondary | ICD-10-CM | POA: Diagnosis not present

## 2018-01-02 DIAGNOSIS — I69354 Hemiplegia and hemiparesis following cerebral infarction affecting left non-dominant side: Secondary | ICD-10-CM | POA: Diagnosis not present

## 2018-01-02 DIAGNOSIS — I129 Hypertensive chronic kidney disease with stage 1 through stage 4 chronic kidney disease, or unspecified chronic kidney disease: Secondary | ICD-10-CM | POA: Diagnosis not present

## 2018-01-02 DIAGNOSIS — M9712XD Periprosthetic fracture around internal prosthetic left knee joint, subsequent encounter: Secondary | ICD-10-CM | POA: Diagnosis not present

## 2018-01-02 DIAGNOSIS — I480 Paroxysmal atrial fibrillation: Secondary | ICD-10-CM | POA: Diagnosis not present

## 2018-01-02 DIAGNOSIS — N183 Chronic kidney disease, stage 3 (moderate): Secondary | ICD-10-CM | POA: Diagnosis not present

## 2018-01-03 DIAGNOSIS — I69354 Hemiplegia and hemiparesis following cerebral infarction affecting left non-dominant side: Secondary | ICD-10-CM | POA: Diagnosis not present

## 2018-01-03 DIAGNOSIS — N183 Chronic kidney disease, stage 3 (moderate): Secondary | ICD-10-CM | POA: Diagnosis not present

## 2018-01-03 DIAGNOSIS — I480 Paroxysmal atrial fibrillation: Secondary | ICD-10-CM | POA: Diagnosis not present

## 2018-01-03 DIAGNOSIS — M9712XD Periprosthetic fracture around internal prosthetic left knee joint, subsequent encounter: Secondary | ICD-10-CM | POA: Diagnosis not present

## 2018-01-03 DIAGNOSIS — I129 Hypertensive chronic kidney disease with stage 1 through stage 4 chronic kidney disease, or unspecified chronic kidney disease: Secondary | ICD-10-CM | POA: Diagnosis not present

## 2018-01-03 DIAGNOSIS — M80052D Age-related osteoporosis with current pathological fracture, left femur, subsequent encounter for fracture with routine healing: Secondary | ICD-10-CM | POA: Diagnosis not present

## 2018-01-05 DIAGNOSIS — M80052D Age-related osteoporosis with current pathological fracture, left femur, subsequent encounter for fracture with routine healing: Secondary | ICD-10-CM | POA: Diagnosis not present

## 2018-01-05 DIAGNOSIS — N183 Chronic kidney disease, stage 3 (moderate): Secondary | ICD-10-CM | POA: Diagnosis not present

## 2018-01-05 DIAGNOSIS — M9712XD Periprosthetic fracture around internal prosthetic left knee joint, subsequent encounter: Secondary | ICD-10-CM | POA: Diagnosis not present

## 2018-01-05 DIAGNOSIS — I69354 Hemiplegia and hemiparesis following cerebral infarction affecting left non-dominant side: Secondary | ICD-10-CM | POA: Diagnosis not present

## 2018-01-05 DIAGNOSIS — I129 Hypertensive chronic kidney disease with stage 1 through stage 4 chronic kidney disease, or unspecified chronic kidney disease: Secondary | ICD-10-CM | POA: Diagnosis not present

## 2018-01-05 DIAGNOSIS — I480 Paroxysmal atrial fibrillation: Secondary | ICD-10-CM | POA: Diagnosis not present

## 2018-01-06 DIAGNOSIS — M80052D Age-related osteoporosis with current pathological fracture, left femur, subsequent encounter for fracture with routine healing: Secondary | ICD-10-CM | POA: Diagnosis not present

## 2018-01-06 DIAGNOSIS — I69354 Hemiplegia and hemiparesis following cerebral infarction affecting left non-dominant side: Secondary | ICD-10-CM | POA: Diagnosis not present

## 2018-01-06 DIAGNOSIS — M9712XD Periprosthetic fracture around internal prosthetic left knee joint, subsequent encounter: Secondary | ICD-10-CM | POA: Diagnosis not present

## 2018-01-06 DIAGNOSIS — I129 Hypertensive chronic kidney disease with stage 1 through stage 4 chronic kidney disease, or unspecified chronic kidney disease: Secondary | ICD-10-CM | POA: Diagnosis not present

## 2018-01-06 DIAGNOSIS — N183 Chronic kidney disease, stage 3 (moderate): Secondary | ICD-10-CM | POA: Diagnosis not present

## 2018-01-06 DIAGNOSIS — I480 Paroxysmal atrial fibrillation: Secondary | ICD-10-CM | POA: Diagnosis not present

## 2018-01-10 DIAGNOSIS — M80052D Age-related osteoporosis with current pathological fracture, left femur, subsequent encounter for fracture with routine healing: Secondary | ICD-10-CM | POA: Diagnosis not present

## 2018-01-10 DIAGNOSIS — I480 Paroxysmal atrial fibrillation: Secondary | ICD-10-CM | POA: Diagnosis not present

## 2018-01-10 DIAGNOSIS — I69354 Hemiplegia and hemiparesis following cerebral infarction affecting left non-dominant side: Secondary | ICD-10-CM | POA: Diagnosis not present

## 2018-01-10 DIAGNOSIS — I129 Hypertensive chronic kidney disease with stage 1 through stage 4 chronic kidney disease, or unspecified chronic kidney disease: Secondary | ICD-10-CM | POA: Diagnosis not present

## 2018-01-10 DIAGNOSIS — N183 Chronic kidney disease, stage 3 (moderate): Secondary | ICD-10-CM | POA: Diagnosis not present

## 2018-01-10 DIAGNOSIS — M9712XD Periprosthetic fracture around internal prosthetic left knee joint, subsequent encounter: Secondary | ICD-10-CM | POA: Diagnosis not present

## 2018-01-11 DIAGNOSIS — I480 Paroxysmal atrial fibrillation: Secondary | ICD-10-CM | POA: Diagnosis not present

## 2018-01-11 DIAGNOSIS — I69354 Hemiplegia and hemiparesis following cerebral infarction affecting left non-dominant side: Secondary | ICD-10-CM | POA: Diagnosis not present

## 2018-01-11 DIAGNOSIS — N183 Chronic kidney disease, stage 3 (moderate): Secondary | ICD-10-CM | POA: Diagnosis not present

## 2018-01-11 DIAGNOSIS — M80052D Age-related osteoporosis with current pathological fracture, left femur, subsequent encounter for fracture with routine healing: Secondary | ICD-10-CM | POA: Diagnosis not present

## 2018-01-11 DIAGNOSIS — M9712XD Periprosthetic fracture around internal prosthetic left knee joint, subsequent encounter: Secondary | ICD-10-CM | POA: Diagnosis not present

## 2018-01-11 DIAGNOSIS — I129 Hypertensive chronic kidney disease with stage 1 through stage 4 chronic kidney disease, or unspecified chronic kidney disease: Secondary | ICD-10-CM | POA: Diagnosis not present

## 2018-01-12 DIAGNOSIS — M9712XD Periprosthetic fracture around internal prosthetic left knee joint, subsequent encounter: Secondary | ICD-10-CM | POA: Diagnosis not present

## 2018-01-12 DIAGNOSIS — I480 Paroxysmal atrial fibrillation: Secondary | ICD-10-CM | POA: Diagnosis not present

## 2018-01-12 DIAGNOSIS — I129 Hypertensive chronic kidney disease with stage 1 through stage 4 chronic kidney disease, or unspecified chronic kidney disease: Secondary | ICD-10-CM | POA: Diagnosis not present

## 2018-01-12 DIAGNOSIS — I69354 Hemiplegia and hemiparesis following cerebral infarction affecting left non-dominant side: Secondary | ICD-10-CM | POA: Diagnosis not present

## 2018-01-12 DIAGNOSIS — Z23 Encounter for immunization: Secondary | ICD-10-CM | POA: Diagnosis not present

## 2018-01-12 DIAGNOSIS — M80052D Age-related osteoporosis with current pathological fracture, left femur, subsequent encounter for fracture with routine healing: Secondary | ICD-10-CM | POA: Diagnosis not present

## 2018-01-12 DIAGNOSIS — N183 Chronic kidney disease, stage 3 (moderate): Secondary | ICD-10-CM | POA: Diagnosis not present

## 2018-01-12 DIAGNOSIS — Z7901 Long term (current) use of anticoagulants: Secondary | ICD-10-CM | POA: Diagnosis not present

## 2018-01-17 DIAGNOSIS — I480 Paroxysmal atrial fibrillation: Secondary | ICD-10-CM | POA: Diagnosis not present

## 2018-01-17 DIAGNOSIS — R399 Unspecified symptoms and signs involving the genitourinary system: Secondary | ICD-10-CM | POA: Diagnosis not present

## 2018-01-17 DIAGNOSIS — M80052D Age-related osteoporosis with current pathological fracture, left femur, subsequent encounter for fracture with routine healing: Secondary | ICD-10-CM | POA: Diagnosis not present

## 2018-01-17 DIAGNOSIS — I69354 Hemiplegia and hemiparesis following cerebral infarction affecting left non-dominant side: Secondary | ICD-10-CM | POA: Diagnosis not present

## 2018-01-17 DIAGNOSIS — M9712XD Periprosthetic fracture around internal prosthetic left knee joint, subsequent encounter: Secondary | ICD-10-CM | POA: Diagnosis not present

## 2018-01-17 DIAGNOSIS — I129 Hypertensive chronic kidney disease with stage 1 through stage 4 chronic kidney disease, or unspecified chronic kidney disease: Secondary | ICD-10-CM | POA: Diagnosis not present

## 2018-01-17 DIAGNOSIS — N183 Chronic kidney disease, stage 3 (moderate): Secondary | ICD-10-CM | POA: Diagnosis not present

## 2018-01-19 DIAGNOSIS — M80052D Age-related osteoporosis with current pathological fracture, left femur, subsequent encounter for fracture with routine healing: Secondary | ICD-10-CM | POA: Diagnosis not present

## 2018-01-19 DIAGNOSIS — N183 Chronic kidney disease, stage 3 (moderate): Secondary | ICD-10-CM | POA: Diagnosis not present

## 2018-01-19 DIAGNOSIS — M9712XD Periprosthetic fracture around internal prosthetic left knee joint, subsequent encounter: Secondary | ICD-10-CM | POA: Diagnosis not present

## 2018-01-19 DIAGNOSIS — I129 Hypertensive chronic kidney disease with stage 1 through stage 4 chronic kidney disease, or unspecified chronic kidney disease: Secondary | ICD-10-CM | POA: Diagnosis not present

## 2018-01-19 DIAGNOSIS — I480 Paroxysmal atrial fibrillation: Secondary | ICD-10-CM | POA: Diagnosis not present

## 2018-01-19 DIAGNOSIS — I69354 Hemiplegia and hemiparesis following cerebral infarction affecting left non-dominant side: Secondary | ICD-10-CM | POA: Diagnosis not present

## 2018-01-20 DIAGNOSIS — M80052D Age-related osteoporosis with current pathological fracture, left femur, subsequent encounter for fracture with routine healing: Secondary | ICD-10-CM | POA: Diagnosis not present

## 2018-01-20 DIAGNOSIS — I129 Hypertensive chronic kidney disease with stage 1 through stage 4 chronic kidney disease, or unspecified chronic kidney disease: Secondary | ICD-10-CM | POA: Diagnosis not present

## 2018-01-20 DIAGNOSIS — N183 Chronic kidney disease, stage 3 (moderate): Secondary | ICD-10-CM | POA: Diagnosis not present

## 2018-01-20 DIAGNOSIS — I69354 Hemiplegia and hemiparesis following cerebral infarction affecting left non-dominant side: Secondary | ICD-10-CM | POA: Diagnosis not present

## 2018-01-20 DIAGNOSIS — M9712XD Periprosthetic fracture around internal prosthetic left knee joint, subsequent encounter: Secondary | ICD-10-CM | POA: Diagnosis not present

## 2018-01-20 DIAGNOSIS — I480 Paroxysmal atrial fibrillation: Secondary | ICD-10-CM | POA: Diagnosis not present

## 2018-01-24 DIAGNOSIS — N183 Chronic kidney disease, stage 3 (moderate): Secondary | ICD-10-CM | POA: Diagnosis not present

## 2018-01-24 DIAGNOSIS — I69354 Hemiplegia and hemiparesis following cerebral infarction affecting left non-dominant side: Secondary | ICD-10-CM | POA: Diagnosis not present

## 2018-01-24 DIAGNOSIS — I129 Hypertensive chronic kidney disease with stage 1 through stage 4 chronic kidney disease, or unspecified chronic kidney disease: Secondary | ICD-10-CM | POA: Diagnosis not present

## 2018-01-24 DIAGNOSIS — I480 Paroxysmal atrial fibrillation: Secondary | ICD-10-CM | POA: Diagnosis not present

## 2018-01-24 DIAGNOSIS — M80052D Age-related osteoporosis with current pathological fracture, left femur, subsequent encounter for fracture with routine healing: Secondary | ICD-10-CM | POA: Diagnosis not present

## 2018-01-24 DIAGNOSIS — M9712XD Periprosthetic fracture around internal prosthetic left knee joint, subsequent encounter: Secondary | ICD-10-CM | POA: Diagnosis not present

## 2018-01-27 DIAGNOSIS — M80052D Age-related osteoporosis with current pathological fracture, left femur, subsequent encounter for fracture with routine healing: Secondary | ICD-10-CM | POA: Diagnosis not present

## 2018-01-27 DIAGNOSIS — I480 Paroxysmal atrial fibrillation: Secondary | ICD-10-CM | POA: Diagnosis not present

## 2018-01-27 DIAGNOSIS — M9712XD Periprosthetic fracture around internal prosthetic left knee joint, subsequent encounter: Secondary | ICD-10-CM | POA: Diagnosis not present

## 2018-01-27 DIAGNOSIS — N183 Chronic kidney disease, stage 3 (moderate): Secondary | ICD-10-CM | POA: Diagnosis not present

## 2018-01-27 DIAGNOSIS — I129 Hypertensive chronic kidney disease with stage 1 through stage 4 chronic kidney disease, or unspecified chronic kidney disease: Secondary | ICD-10-CM | POA: Diagnosis not present

## 2018-01-27 DIAGNOSIS — I69354 Hemiplegia and hemiparesis following cerebral infarction affecting left non-dominant side: Secondary | ICD-10-CM | POA: Diagnosis not present

## 2018-01-31 DIAGNOSIS — I69354 Hemiplegia and hemiparesis following cerebral infarction affecting left non-dominant side: Secondary | ICD-10-CM | POA: Diagnosis not present

## 2018-01-31 DIAGNOSIS — M80052D Age-related osteoporosis with current pathological fracture, left femur, subsequent encounter for fracture with routine healing: Secondary | ICD-10-CM | POA: Diagnosis not present

## 2018-01-31 DIAGNOSIS — N183 Chronic kidney disease, stage 3 (moderate): Secondary | ICD-10-CM | POA: Diagnosis not present

## 2018-01-31 DIAGNOSIS — M9712XD Periprosthetic fracture around internal prosthetic left knee joint, subsequent encounter: Secondary | ICD-10-CM | POA: Diagnosis not present

## 2018-01-31 DIAGNOSIS — I129 Hypertensive chronic kidney disease with stage 1 through stage 4 chronic kidney disease, or unspecified chronic kidney disease: Secondary | ICD-10-CM | POA: Diagnosis not present

## 2018-01-31 DIAGNOSIS — I480 Paroxysmal atrial fibrillation: Secondary | ICD-10-CM | POA: Diagnosis not present

## 2018-02-03 DIAGNOSIS — I129 Hypertensive chronic kidney disease with stage 1 through stage 4 chronic kidney disease, or unspecified chronic kidney disease: Secondary | ICD-10-CM | POA: Diagnosis not present

## 2018-02-03 DIAGNOSIS — M9712XD Periprosthetic fracture around internal prosthetic left knee joint, subsequent encounter: Secondary | ICD-10-CM | POA: Diagnosis not present

## 2018-02-03 DIAGNOSIS — I69354 Hemiplegia and hemiparesis following cerebral infarction affecting left non-dominant side: Secondary | ICD-10-CM | POA: Diagnosis not present

## 2018-02-03 DIAGNOSIS — I480 Paroxysmal atrial fibrillation: Secondary | ICD-10-CM | POA: Diagnosis not present

## 2018-02-03 DIAGNOSIS — N183 Chronic kidney disease, stage 3 (moderate): Secondary | ICD-10-CM | POA: Diagnosis not present

## 2018-02-03 DIAGNOSIS — M80052D Age-related osteoporosis with current pathological fracture, left femur, subsequent encounter for fracture with routine healing: Secondary | ICD-10-CM | POA: Diagnosis not present

## 2018-02-06 DIAGNOSIS — N183 Chronic kidney disease, stage 3 (moderate): Secondary | ICD-10-CM | POA: Diagnosis not present

## 2018-02-06 DIAGNOSIS — M80052D Age-related osteoporosis with current pathological fracture, left femur, subsequent encounter for fracture with routine healing: Secondary | ICD-10-CM | POA: Diagnosis not present

## 2018-02-06 DIAGNOSIS — I129 Hypertensive chronic kidney disease with stage 1 through stage 4 chronic kidney disease, or unspecified chronic kidney disease: Secondary | ICD-10-CM | POA: Diagnosis not present

## 2018-02-06 DIAGNOSIS — I480 Paroxysmal atrial fibrillation: Secondary | ICD-10-CM | POA: Diagnosis not present

## 2018-02-06 DIAGNOSIS — I69354 Hemiplegia and hemiparesis following cerebral infarction affecting left non-dominant side: Secondary | ICD-10-CM | POA: Diagnosis not present

## 2018-02-06 DIAGNOSIS — M9712XD Periprosthetic fracture around internal prosthetic left knee joint, subsequent encounter: Secondary | ICD-10-CM | POA: Diagnosis not present

## 2018-02-09 ENCOUNTER — Other Ambulatory Visit: Payer: Self-pay | Admitting: Student

## 2018-02-09 DIAGNOSIS — M7989 Other specified soft tissue disorders: Principal | ICD-10-CM

## 2018-02-09 DIAGNOSIS — M80052D Age-related osteoporosis with current pathological fracture, left femur, subsequent encounter for fracture with routine healing: Secondary | ICD-10-CM | POA: Diagnosis not present

## 2018-02-09 DIAGNOSIS — I129 Hypertensive chronic kidney disease with stage 1 through stage 4 chronic kidney disease, or unspecified chronic kidney disease: Secondary | ICD-10-CM | POA: Diagnosis not present

## 2018-02-09 DIAGNOSIS — I69354 Hemiplegia and hemiparesis following cerebral infarction affecting left non-dominant side: Secondary | ICD-10-CM | POA: Diagnosis not present

## 2018-02-09 DIAGNOSIS — N183 Chronic kidney disease, stage 3 (moderate): Secondary | ICD-10-CM | POA: Diagnosis not present

## 2018-02-09 DIAGNOSIS — I480 Paroxysmal atrial fibrillation: Secondary | ICD-10-CM | POA: Diagnosis not present

## 2018-02-09 DIAGNOSIS — M9712XD Periprosthetic fracture around internal prosthetic left knee joint, subsequent encounter: Secondary | ICD-10-CM | POA: Diagnosis not present

## 2018-02-09 DIAGNOSIS — M79662 Pain in left lower leg: Secondary | ICD-10-CM

## 2018-02-10 ENCOUNTER — Ambulatory Visit
Admission: RE | Admit: 2018-02-10 | Discharge: 2018-02-10 | Disposition: A | Discharge status: Home / Self Care | Payer: Medicare Other | Source: Ambulatory Visit | Attending: Student | Admitting: Student

## 2018-02-10 DIAGNOSIS — M7989 Other specified soft tissue disorders: Principal | ICD-10-CM

## 2018-02-10 DIAGNOSIS — M79662 Pain in left lower leg: Secondary | ICD-10-CM

## 2018-02-10 DIAGNOSIS — R600 Localized edema: Secondary | ICD-10-CM | POA: Diagnosis not present

## 2018-02-14 DIAGNOSIS — M80052D Age-related osteoporosis with current pathological fracture, left femur, subsequent encounter for fracture with routine healing: Secondary | ICD-10-CM | POA: Diagnosis not present

## 2018-02-14 DIAGNOSIS — M9712XD Periprosthetic fracture around internal prosthetic left knee joint, subsequent encounter: Secondary | ICD-10-CM | POA: Diagnosis not present

## 2018-02-14 DIAGNOSIS — M978XXD Periprosthetic fracture around other internal prosthetic joint, subsequent encounter: Secondary | ICD-10-CM | POA: Diagnosis not present

## 2018-02-14 DIAGNOSIS — Z7901 Long term (current) use of anticoagulants: Secondary | ICD-10-CM | POA: Diagnosis not present

## 2018-02-14 DIAGNOSIS — I480 Paroxysmal atrial fibrillation: Secondary | ICD-10-CM | POA: Diagnosis not present

## 2018-02-14 DIAGNOSIS — I69354 Hemiplegia and hemiparesis following cerebral infarction affecting left non-dominant side: Secondary | ICD-10-CM | POA: Diagnosis not present

## 2018-02-14 DIAGNOSIS — N183 Chronic kidney disease, stage 3 (moderate): Secondary | ICD-10-CM | POA: Diagnosis not present

## 2018-02-14 DIAGNOSIS — I129 Hypertensive chronic kidney disease with stage 1 through stage 4 chronic kidney disease, or unspecified chronic kidney disease: Secondary | ICD-10-CM | POA: Diagnosis not present

## 2018-02-15 DIAGNOSIS — N183 Chronic kidney disease, stage 3 (moderate): Secondary | ICD-10-CM | POA: Diagnosis not present

## 2018-02-15 DIAGNOSIS — M9712XD Periprosthetic fracture around internal prosthetic left knee joint, subsequent encounter: Secondary | ICD-10-CM | POA: Diagnosis not present

## 2018-02-15 DIAGNOSIS — M80052D Age-related osteoporosis with current pathological fracture, left femur, subsequent encounter for fracture with routine healing: Secondary | ICD-10-CM | POA: Diagnosis not present

## 2018-02-15 DIAGNOSIS — I69354 Hemiplegia and hemiparesis following cerebral infarction affecting left non-dominant side: Secondary | ICD-10-CM | POA: Diagnosis not present

## 2018-02-15 DIAGNOSIS — I480 Paroxysmal atrial fibrillation: Secondary | ICD-10-CM | POA: Diagnosis not present

## 2018-02-15 DIAGNOSIS — I129 Hypertensive chronic kidney disease with stage 1 through stage 4 chronic kidney disease, or unspecified chronic kidney disease: Secondary | ICD-10-CM | POA: Diagnosis not present

## 2018-02-21 DIAGNOSIS — I129 Hypertensive chronic kidney disease with stage 1 through stage 4 chronic kidney disease, or unspecified chronic kidney disease: Secondary | ICD-10-CM | POA: Diagnosis not present

## 2018-02-21 DIAGNOSIS — M9712XD Periprosthetic fracture around internal prosthetic left knee joint, subsequent encounter: Secondary | ICD-10-CM | POA: Diagnosis not present

## 2018-02-21 DIAGNOSIS — I480 Paroxysmal atrial fibrillation: Secondary | ICD-10-CM | POA: Diagnosis not present

## 2018-02-21 DIAGNOSIS — I69354 Hemiplegia and hemiparesis following cerebral infarction affecting left non-dominant side: Secondary | ICD-10-CM | POA: Diagnosis not present

## 2018-02-21 DIAGNOSIS — N183 Chronic kidney disease, stage 3 (moderate): Secondary | ICD-10-CM | POA: Diagnosis not present

## 2018-02-21 DIAGNOSIS — M80052D Age-related osteoporosis with current pathological fracture, left femur, subsequent encounter for fracture with routine healing: Secondary | ICD-10-CM | POA: Diagnosis not present

## 2018-02-22 DIAGNOSIS — N183 Chronic kidney disease, stage 3 (moderate): Secondary | ICD-10-CM | POA: Diagnosis not present

## 2018-02-22 DIAGNOSIS — I129 Hypertensive chronic kidney disease with stage 1 through stage 4 chronic kidney disease, or unspecified chronic kidney disease: Secondary | ICD-10-CM | POA: Diagnosis not present

## 2018-02-22 DIAGNOSIS — M9712XD Periprosthetic fracture around internal prosthetic left knee joint, subsequent encounter: Secondary | ICD-10-CM | POA: Diagnosis not present

## 2018-02-22 DIAGNOSIS — M80052D Age-related osteoporosis with current pathological fracture, left femur, subsequent encounter for fracture with routine healing: Secondary | ICD-10-CM | POA: Diagnosis not present

## 2018-02-22 DIAGNOSIS — I69354 Hemiplegia and hemiparesis following cerebral infarction affecting left non-dominant side: Secondary | ICD-10-CM | POA: Diagnosis not present

## 2018-02-22 DIAGNOSIS — I480 Paroxysmal atrial fibrillation: Secondary | ICD-10-CM | POA: Diagnosis not present

## 2018-02-25 DIAGNOSIS — E7849 Other hyperlipidemia: Secondary | ICD-10-CM | POA: Diagnosis not present

## 2018-02-25 DIAGNOSIS — N183 Chronic kidney disease, stage 3 (moderate): Secondary | ICD-10-CM | POA: Diagnosis not present

## 2018-02-25 DIAGNOSIS — I69354 Hemiplegia and hemiparesis following cerebral infarction affecting left non-dominant side: Secondary | ICD-10-CM | POA: Diagnosis not present

## 2018-02-25 DIAGNOSIS — D6489 Other specified anemias: Secondary | ICD-10-CM | POA: Diagnosis not present

## 2018-02-25 DIAGNOSIS — I129 Hypertensive chronic kidney disease with stage 1 through stage 4 chronic kidney disease, or unspecified chronic kidney disease: Secondary | ICD-10-CM | POA: Diagnosis not present

## 2018-02-25 DIAGNOSIS — I480 Paroxysmal atrial fibrillation: Secondary | ICD-10-CM | POA: Diagnosis not present

## 2018-02-25 DIAGNOSIS — Z7901 Long term (current) use of anticoagulants: Secondary | ICD-10-CM | POA: Diagnosis not present

## 2018-02-25 DIAGNOSIS — M9712XD Periprosthetic fracture around internal prosthetic left knee joint, subsequent encounter: Secondary | ICD-10-CM | POA: Diagnosis not present

## 2018-02-25 DIAGNOSIS — K219 Gastro-esophageal reflux disease without esophagitis: Secondary | ICD-10-CM | POA: Diagnosis not present

## 2018-02-25 DIAGNOSIS — D649 Anemia, unspecified: Secondary | ICD-10-CM | POA: Diagnosis not present

## 2018-02-25 DIAGNOSIS — M80052D Age-related osteoporosis with current pathological fracture, left femur, subsequent encounter for fracture with routine healing: Secondary | ICD-10-CM | POA: Diagnosis not present

## 2018-02-25 DIAGNOSIS — W19XXXD Unspecified fall, subsequent encounter: Secondary | ICD-10-CM | POA: Diagnosis not present

## 2018-02-25 DIAGNOSIS — E785 Hyperlipidemia, unspecified: Secondary | ICD-10-CM | POA: Diagnosis not present

## 2018-02-27 DIAGNOSIS — I129 Hypertensive chronic kidney disease with stage 1 through stage 4 chronic kidney disease, or unspecified chronic kidney disease: Secondary | ICD-10-CM | POA: Diagnosis not present

## 2018-02-27 DIAGNOSIS — N183 Chronic kidney disease, stage 3 (moderate): Secondary | ICD-10-CM | POA: Diagnosis not present

## 2018-02-27 DIAGNOSIS — M80052D Age-related osteoporosis with current pathological fracture, left femur, subsequent encounter for fracture with routine healing: Secondary | ICD-10-CM | POA: Diagnosis not present

## 2018-02-27 DIAGNOSIS — I480 Paroxysmal atrial fibrillation: Secondary | ICD-10-CM | POA: Diagnosis not present

## 2018-02-27 DIAGNOSIS — M9712XD Periprosthetic fracture around internal prosthetic left knee joint, subsequent encounter: Secondary | ICD-10-CM | POA: Diagnosis not present

## 2018-02-27 DIAGNOSIS — I69354 Hemiplegia and hemiparesis following cerebral infarction affecting left non-dominant side: Secondary | ICD-10-CM | POA: Diagnosis not present

## 2018-03-01 DIAGNOSIS — H02403 Unspecified ptosis of bilateral eyelids: Secondary | ICD-10-CM | POA: Diagnosis not present

## 2018-03-01 DIAGNOSIS — H524 Presbyopia: Secondary | ICD-10-CM | POA: Diagnosis not present

## 2018-03-01 DIAGNOSIS — H52203 Unspecified astigmatism, bilateral: Secondary | ICD-10-CM | POA: Diagnosis not present

## 2018-03-01 DIAGNOSIS — H5213 Myopia, bilateral: Secondary | ICD-10-CM | POA: Diagnosis not present

## 2018-03-01 DIAGNOSIS — H04551 Acquired stenosis of right nasolacrimal duct: Secondary | ICD-10-CM | POA: Diagnosis not present

## 2018-03-01 DIAGNOSIS — H02002 Unspecified entropion of right lower eyelid: Secondary | ICD-10-CM | POA: Diagnosis not present

## 2018-03-01 DIAGNOSIS — Z961 Presence of intraocular lens: Secondary | ICD-10-CM | POA: Diagnosis not present

## 2018-03-01 DIAGNOSIS — H1859 Other hereditary corneal dystrophies: Secondary | ICD-10-CM | POA: Diagnosis not present

## 2018-03-02 DIAGNOSIS — I480 Paroxysmal atrial fibrillation: Secondary | ICD-10-CM | POA: Diagnosis not present

## 2018-03-02 DIAGNOSIS — I69354 Hemiplegia and hemiparesis following cerebral infarction affecting left non-dominant side: Secondary | ICD-10-CM | POA: Diagnosis not present

## 2018-03-02 DIAGNOSIS — M80052D Age-related osteoporosis with current pathological fracture, left femur, subsequent encounter for fracture with routine healing: Secondary | ICD-10-CM | POA: Diagnosis not present

## 2018-03-02 DIAGNOSIS — M9712XD Periprosthetic fracture around internal prosthetic left knee joint, subsequent encounter: Secondary | ICD-10-CM | POA: Diagnosis not present

## 2018-03-02 DIAGNOSIS — N183 Chronic kidney disease, stage 3 (moderate): Secondary | ICD-10-CM | POA: Diagnosis not present

## 2018-03-02 DIAGNOSIS — I129 Hypertensive chronic kidney disease with stage 1 through stage 4 chronic kidney disease, or unspecified chronic kidney disease: Secondary | ICD-10-CM | POA: Diagnosis not present

## 2018-03-07 DIAGNOSIS — M9712XD Periprosthetic fracture around internal prosthetic left knee joint, subsequent encounter: Secondary | ICD-10-CM | POA: Diagnosis not present

## 2018-03-07 DIAGNOSIS — I129 Hypertensive chronic kidney disease with stage 1 through stage 4 chronic kidney disease, or unspecified chronic kidney disease: Secondary | ICD-10-CM | POA: Diagnosis not present

## 2018-03-07 DIAGNOSIS — I69354 Hemiplegia and hemiparesis following cerebral infarction affecting left non-dominant side: Secondary | ICD-10-CM | POA: Diagnosis not present

## 2018-03-07 DIAGNOSIS — M80052D Age-related osteoporosis with current pathological fracture, left femur, subsequent encounter for fracture with routine healing: Secondary | ICD-10-CM | POA: Diagnosis not present

## 2018-03-07 DIAGNOSIS — I480 Paroxysmal atrial fibrillation: Secondary | ICD-10-CM | POA: Diagnosis not present

## 2018-03-07 DIAGNOSIS — N183 Chronic kidney disease, stage 3 (moderate): Secondary | ICD-10-CM | POA: Diagnosis not present

## 2018-03-09 DIAGNOSIS — I129 Hypertensive chronic kidney disease with stage 1 through stage 4 chronic kidney disease, or unspecified chronic kidney disease: Secondary | ICD-10-CM | POA: Diagnosis not present

## 2018-03-09 DIAGNOSIS — I69354 Hemiplegia and hemiparesis following cerebral infarction affecting left non-dominant side: Secondary | ICD-10-CM | POA: Diagnosis not present

## 2018-03-09 DIAGNOSIS — M9712XD Periprosthetic fracture around internal prosthetic left knee joint, subsequent encounter: Secondary | ICD-10-CM | POA: Diagnosis not present

## 2018-03-09 DIAGNOSIS — I480 Paroxysmal atrial fibrillation: Secondary | ICD-10-CM | POA: Diagnosis not present

## 2018-03-09 DIAGNOSIS — M80052D Age-related osteoporosis with current pathological fracture, left femur, subsequent encounter for fracture with routine healing: Secondary | ICD-10-CM | POA: Diagnosis not present

## 2018-03-09 DIAGNOSIS — N183 Chronic kidney disease, stage 3 (moderate): Secondary | ICD-10-CM | POA: Diagnosis not present

## 2018-03-14 DIAGNOSIS — I129 Hypertensive chronic kidney disease with stage 1 through stage 4 chronic kidney disease, or unspecified chronic kidney disease: Secondary | ICD-10-CM | POA: Diagnosis not present

## 2018-03-14 DIAGNOSIS — M9712XD Periprosthetic fracture around internal prosthetic left knee joint, subsequent encounter: Secondary | ICD-10-CM | POA: Diagnosis not present

## 2018-03-14 DIAGNOSIS — I69354 Hemiplegia and hemiparesis following cerebral infarction affecting left non-dominant side: Secondary | ICD-10-CM | POA: Diagnosis not present

## 2018-03-14 DIAGNOSIS — I480 Paroxysmal atrial fibrillation: Secondary | ICD-10-CM | POA: Diagnosis not present

## 2018-03-14 DIAGNOSIS — N183 Chronic kidney disease, stage 3 (moderate): Secondary | ICD-10-CM | POA: Diagnosis not present

## 2018-03-14 DIAGNOSIS — M80052D Age-related osteoporosis with current pathological fracture, left femur, subsequent encounter for fracture with routine healing: Secondary | ICD-10-CM | POA: Diagnosis not present

## 2018-03-16 DIAGNOSIS — I480 Paroxysmal atrial fibrillation: Secondary | ICD-10-CM | POA: Diagnosis not present

## 2018-03-16 DIAGNOSIS — I129 Hypertensive chronic kidney disease with stage 1 through stage 4 chronic kidney disease, or unspecified chronic kidney disease: Secondary | ICD-10-CM | POA: Diagnosis not present

## 2018-03-16 DIAGNOSIS — M80052D Age-related osteoporosis with current pathological fracture, left femur, subsequent encounter for fracture with routine healing: Secondary | ICD-10-CM | POA: Diagnosis not present

## 2018-03-16 DIAGNOSIS — M9712XD Periprosthetic fracture around internal prosthetic left knee joint, subsequent encounter: Secondary | ICD-10-CM | POA: Diagnosis not present

## 2018-03-16 DIAGNOSIS — Z7901 Long term (current) use of anticoagulants: Secondary | ICD-10-CM | POA: Diagnosis not present

## 2018-03-16 DIAGNOSIS — N183 Chronic kidney disease, stage 3 (moderate): Secondary | ICD-10-CM | POA: Diagnosis not present

## 2018-03-16 DIAGNOSIS — I69354 Hemiplegia and hemiparesis following cerebral infarction affecting left non-dominant side: Secondary | ICD-10-CM | POA: Diagnosis not present

## 2018-03-20 DIAGNOSIS — I480 Paroxysmal atrial fibrillation: Secondary | ICD-10-CM | POA: Diagnosis not present

## 2018-03-20 DIAGNOSIS — M9712XD Periprosthetic fracture around internal prosthetic left knee joint, subsequent encounter: Secondary | ICD-10-CM | POA: Diagnosis not present

## 2018-03-20 DIAGNOSIS — I129 Hypertensive chronic kidney disease with stage 1 through stage 4 chronic kidney disease, or unspecified chronic kidney disease: Secondary | ICD-10-CM | POA: Diagnosis not present

## 2018-03-20 DIAGNOSIS — M80052D Age-related osteoporosis with current pathological fracture, left femur, subsequent encounter for fracture with routine healing: Secondary | ICD-10-CM | POA: Diagnosis not present

## 2018-03-20 DIAGNOSIS — N183 Chronic kidney disease, stage 3 (moderate): Secondary | ICD-10-CM | POA: Diagnosis not present

## 2018-03-20 DIAGNOSIS — I69354 Hemiplegia and hemiparesis following cerebral infarction affecting left non-dominant side: Secondary | ICD-10-CM | POA: Diagnosis not present

## 2018-03-24 DIAGNOSIS — I129 Hypertensive chronic kidney disease with stage 1 through stage 4 chronic kidney disease, or unspecified chronic kidney disease: Secondary | ICD-10-CM | POA: Diagnosis not present

## 2018-03-24 DIAGNOSIS — M9712XD Periprosthetic fracture around internal prosthetic left knee joint, subsequent encounter: Secondary | ICD-10-CM | POA: Diagnosis not present

## 2018-03-24 DIAGNOSIS — M80052D Age-related osteoporosis with current pathological fracture, left femur, subsequent encounter for fracture with routine healing: Secondary | ICD-10-CM | POA: Diagnosis not present

## 2018-03-24 DIAGNOSIS — I69354 Hemiplegia and hemiparesis following cerebral infarction affecting left non-dominant side: Secondary | ICD-10-CM | POA: Diagnosis not present

## 2018-03-24 DIAGNOSIS — N183 Chronic kidney disease, stage 3 (moderate): Secondary | ICD-10-CM | POA: Diagnosis not present

## 2018-03-24 DIAGNOSIS — I480 Paroxysmal atrial fibrillation: Secondary | ICD-10-CM | POA: Diagnosis not present

## 2018-03-30 DIAGNOSIS — I69354 Hemiplegia and hemiparesis following cerebral infarction affecting left non-dominant side: Secondary | ICD-10-CM | POA: Diagnosis not present

## 2018-03-30 DIAGNOSIS — M80052D Age-related osteoporosis with current pathological fracture, left femur, subsequent encounter for fracture with routine healing: Secondary | ICD-10-CM | POA: Diagnosis not present

## 2018-03-30 DIAGNOSIS — Z7901 Long term (current) use of anticoagulants: Secondary | ICD-10-CM | POA: Diagnosis not present

## 2018-03-30 DIAGNOSIS — I480 Paroxysmal atrial fibrillation: Secondary | ICD-10-CM | POA: Diagnosis not present

## 2018-03-30 DIAGNOSIS — I129 Hypertensive chronic kidney disease with stage 1 through stage 4 chronic kidney disease, or unspecified chronic kidney disease: Secondary | ICD-10-CM | POA: Diagnosis not present

## 2018-03-30 DIAGNOSIS — N183 Chronic kidney disease, stage 3 (moderate): Secondary | ICD-10-CM | POA: Diagnosis not present

## 2018-03-30 DIAGNOSIS — M9712XD Periprosthetic fracture around internal prosthetic left knee joint, subsequent encounter: Secondary | ICD-10-CM | POA: Diagnosis not present

## 2018-03-31 DIAGNOSIS — M80052D Age-related osteoporosis with current pathological fracture, left femur, subsequent encounter for fracture with routine healing: Secondary | ICD-10-CM | POA: Diagnosis not present

## 2018-03-31 DIAGNOSIS — M9712XD Periprosthetic fracture around internal prosthetic left knee joint, subsequent encounter: Secondary | ICD-10-CM | POA: Diagnosis not present

## 2018-03-31 DIAGNOSIS — I480 Paroxysmal atrial fibrillation: Secondary | ICD-10-CM | POA: Diagnosis not present

## 2018-03-31 DIAGNOSIS — I69354 Hemiplegia and hemiparesis following cerebral infarction affecting left non-dominant side: Secondary | ICD-10-CM | POA: Diagnosis not present

## 2018-03-31 DIAGNOSIS — I129 Hypertensive chronic kidney disease with stage 1 through stage 4 chronic kidney disease, or unspecified chronic kidney disease: Secondary | ICD-10-CM | POA: Diagnosis not present

## 2018-03-31 DIAGNOSIS — N183 Chronic kidney disease, stage 3 (moderate): Secondary | ICD-10-CM | POA: Diagnosis not present

## 2018-04-04 DIAGNOSIS — I69354 Hemiplegia and hemiparesis following cerebral infarction affecting left non-dominant side: Secondary | ICD-10-CM | POA: Diagnosis not present

## 2018-04-04 DIAGNOSIS — N183 Chronic kidney disease, stage 3 (moderate): Secondary | ICD-10-CM | POA: Diagnosis not present

## 2018-04-04 DIAGNOSIS — I129 Hypertensive chronic kidney disease with stage 1 through stage 4 chronic kidney disease, or unspecified chronic kidney disease: Secondary | ICD-10-CM | POA: Diagnosis not present

## 2018-04-04 DIAGNOSIS — I480 Paroxysmal atrial fibrillation: Secondary | ICD-10-CM | POA: Diagnosis not present

## 2018-04-04 DIAGNOSIS — M9712XD Periprosthetic fracture around internal prosthetic left knee joint, subsequent encounter: Secondary | ICD-10-CM | POA: Diagnosis not present

## 2018-04-04 DIAGNOSIS — M80052D Age-related osteoporosis with current pathological fracture, left femur, subsequent encounter for fracture with routine healing: Secondary | ICD-10-CM | POA: Diagnosis not present

## 2018-04-07 DIAGNOSIS — I129 Hypertensive chronic kidney disease with stage 1 through stage 4 chronic kidney disease, or unspecified chronic kidney disease: Secondary | ICD-10-CM | POA: Diagnosis not present

## 2018-04-07 DIAGNOSIS — M80052D Age-related osteoporosis with current pathological fracture, left femur, subsequent encounter for fracture with routine healing: Secondary | ICD-10-CM | POA: Diagnosis not present

## 2018-04-07 DIAGNOSIS — I69354 Hemiplegia and hemiparesis following cerebral infarction affecting left non-dominant side: Secondary | ICD-10-CM | POA: Diagnosis not present

## 2018-04-07 DIAGNOSIS — M9712XD Periprosthetic fracture around internal prosthetic left knee joint, subsequent encounter: Secondary | ICD-10-CM | POA: Diagnosis not present

## 2018-04-07 DIAGNOSIS — I480 Paroxysmal atrial fibrillation: Secondary | ICD-10-CM | POA: Diagnosis not present

## 2018-04-07 DIAGNOSIS — N183 Chronic kidney disease, stage 3 (moderate): Secondary | ICD-10-CM | POA: Diagnosis not present

## 2018-04-11 DIAGNOSIS — I480 Paroxysmal atrial fibrillation: Secondary | ICD-10-CM | POA: Diagnosis not present

## 2018-04-11 DIAGNOSIS — I69354 Hemiplegia and hemiparesis following cerebral infarction affecting left non-dominant side: Secondary | ICD-10-CM | POA: Diagnosis not present

## 2018-04-11 DIAGNOSIS — N183 Chronic kidney disease, stage 3 (moderate): Secondary | ICD-10-CM | POA: Diagnosis not present

## 2018-04-11 DIAGNOSIS — I129 Hypertensive chronic kidney disease with stage 1 through stage 4 chronic kidney disease, or unspecified chronic kidney disease: Secondary | ICD-10-CM | POA: Diagnosis not present

## 2018-04-11 DIAGNOSIS — M9712XD Periprosthetic fracture around internal prosthetic left knee joint, subsequent encounter: Secondary | ICD-10-CM | POA: Diagnosis not present

## 2018-04-11 DIAGNOSIS — M80052D Age-related osteoporosis with current pathological fracture, left femur, subsequent encounter for fracture with routine healing: Secondary | ICD-10-CM | POA: Diagnosis not present

## 2018-04-12 DIAGNOSIS — Z7901 Long term (current) use of anticoagulants: Secondary | ICD-10-CM | POA: Diagnosis not present

## 2018-04-12 DIAGNOSIS — I480 Paroxysmal atrial fibrillation: Secondary | ICD-10-CM | POA: Diagnosis not present

## 2018-04-14 DIAGNOSIS — I480 Paroxysmal atrial fibrillation: Secondary | ICD-10-CM | POA: Diagnosis not present

## 2018-04-14 DIAGNOSIS — N183 Chronic kidney disease, stage 3 (moderate): Secondary | ICD-10-CM | POA: Diagnosis not present

## 2018-04-14 DIAGNOSIS — I69354 Hemiplegia and hemiparesis following cerebral infarction affecting left non-dominant side: Secondary | ICD-10-CM | POA: Diagnosis not present

## 2018-04-14 DIAGNOSIS — M9712XD Periprosthetic fracture around internal prosthetic left knee joint, subsequent encounter: Secondary | ICD-10-CM | POA: Diagnosis not present

## 2018-04-14 DIAGNOSIS — M80052D Age-related osteoporosis with current pathological fracture, left femur, subsequent encounter for fracture with routine healing: Secondary | ICD-10-CM | POA: Diagnosis not present

## 2018-04-14 DIAGNOSIS — I129 Hypertensive chronic kidney disease with stage 1 through stage 4 chronic kidney disease, or unspecified chronic kidney disease: Secondary | ICD-10-CM | POA: Diagnosis not present

## 2018-04-18 DIAGNOSIS — M80052D Age-related osteoporosis with current pathological fracture, left femur, subsequent encounter for fracture with routine healing: Secondary | ICD-10-CM | POA: Diagnosis not present

## 2018-04-18 DIAGNOSIS — I480 Paroxysmal atrial fibrillation: Secondary | ICD-10-CM | POA: Diagnosis not present

## 2018-04-18 DIAGNOSIS — I129 Hypertensive chronic kidney disease with stage 1 through stage 4 chronic kidney disease, or unspecified chronic kidney disease: Secondary | ICD-10-CM | POA: Diagnosis not present

## 2018-04-18 DIAGNOSIS — M9712XD Periprosthetic fracture around internal prosthetic left knee joint, subsequent encounter: Secondary | ICD-10-CM | POA: Diagnosis not present

## 2018-04-18 DIAGNOSIS — I69354 Hemiplegia and hemiparesis following cerebral infarction affecting left non-dominant side: Secondary | ICD-10-CM | POA: Diagnosis not present

## 2018-04-18 DIAGNOSIS — N183 Chronic kidney disease, stage 3 (moderate): Secondary | ICD-10-CM | POA: Diagnosis not present

## 2018-04-21 DIAGNOSIS — I69354 Hemiplegia and hemiparesis following cerebral infarction affecting left non-dominant side: Secondary | ICD-10-CM | POA: Diagnosis not present

## 2018-04-21 DIAGNOSIS — N183 Chronic kidney disease, stage 3 (moderate): Secondary | ICD-10-CM | POA: Diagnosis not present

## 2018-04-21 DIAGNOSIS — M9712XD Periprosthetic fracture around internal prosthetic left knee joint, subsequent encounter: Secondary | ICD-10-CM | POA: Diagnosis not present

## 2018-04-21 DIAGNOSIS — M80052D Age-related osteoporosis with current pathological fracture, left femur, subsequent encounter for fracture with routine healing: Secondary | ICD-10-CM | POA: Diagnosis not present

## 2018-04-21 DIAGNOSIS — I480 Paroxysmal atrial fibrillation: Secondary | ICD-10-CM | POA: Diagnosis not present

## 2018-04-21 DIAGNOSIS — I129 Hypertensive chronic kidney disease with stage 1 through stage 4 chronic kidney disease, or unspecified chronic kidney disease: Secondary | ICD-10-CM | POA: Diagnosis not present

## 2018-05-05 DIAGNOSIS — I480 Paroxysmal atrial fibrillation: Secondary | ICD-10-CM | POA: Diagnosis not present

## 2018-05-05 DIAGNOSIS — Z6827 Body mass index (BMI) 27.0-27.9, adult: Secondary | ICD-10-CM | POA: Diagnosis not present

## 2018-05-05 DIAGNOSIS — Z7901 Long term (current) use of anticoagulants: Secondary | ICD-10-CM | POA: Diagnosis not present

## 2018-05-29 DIAGNOSIS — E7849 Other hyperlipidemia: Secondary | ICD-10-CM | POA: Diagnosis not present

## 2018-05-29 DIAGNOSIS — M859 Disorder of bone density and structure, unspecified: Secondary | ICD-10-CM | POA: Diagnosis not present

## 2018-05-29 DIAGNOSIS — N183 Chronic kidney disease, stage 3 (moderate): Secondary | ICD-10-CM | POA: Diagnosis not present

## 2018-05-29 DIAGNOSIS — R82998 Other abnormal findings in urine: Secondary | ICD-10-CM | POA: Diagnosis not present

## 2018-05-30 DIAGNOSIS — L814 Other melanin hyperpigmentation: Secondary | ICD-10-CM | POA: Diagnosis not present

## 2018-05-30 DIAGNOSIS — D1801 Hemangioma of skin and subcutaneous tissue: Secondary | ICD-10-CM | POA: Diagnosis not present

## 2018-05-30 DIAGNOSIS — Z23 Encounter for immunization: Secondary | ICD-10-CM | POA: Diagnosis not present

## 2018-05-30 DIAGNOSIS — D225 Melanocytic nevi of trunk: Secondary | ICD-10-CM | POA: Diagnosis not present

## 2018-05-30 DIAGNOSIS — D2271 Melanocytic nevi of right lower limb, including hip: Secondary | ICD-10-CM | POA: Diagnosis not present

## 2018-05-30 DIAGNOSIS — Z85828 Personal history of other malignant neoplasm of skin: Secondary | ICD-10-CM | POA: Diagnosis not present

## 2018-05-30 DIAGNOSIS — L821 Other seborrheic keratosis: Secondary | ICD-10-CM | POA: Diagnosis not present

## 2018-05-30 DIAGNOSIS — L570 Actinic keratosis: Secondary | ICD-10-CM | POA: Diagnosis not present

## 2018-05-30 DIAGNOSIS — I872 Venous insufficiency (chronic) (peripheral): Secondary | ICD-10-CM | POA: Diagnosis not present

## 2018-06-01 DIAGNOSIS — M25512 Pain in left shoulder: Secondary | ICD-10-CM | POA: Diagnosis not present

## 2018-06-01 DIAGNOSIS — R0609 Other forms of dyspnea: Secondary | ICD-10-CM | POA: Diagnosis not present

## 2018-06-01 DIAGNOSIS — I10 Essential (primary) hypertension: Secondary | ICD-10-CM | POA: Diagnosis not present

## 2018-06-01 DIAGNOSIS — Z7901 Long term (current) use of anticoagulants: Secondary | ICD-10-CM | POA: Diagnosis not present

## 2018-06-01 DIAGNOSIS — M859 Disorder of bone density and structure, unspecified: Secondary | ICD-10-CM | POA: Diagnosis not present

## 2018-06-01 DIAGNOSIS — M545 Low back pain: Secondary | ICD-10-CM | POA: Diagnosis not present

## 2018-06-01 DIAGNOSIS — I480 Paroxysmal atrial fibrillation: Secondary | ICD-10-CM | POA: Diagnosis not present

## 2018-06-01 DIAGNOSIS — Z0000 Encounter for general adult medical examination without abnormal findings: Secondary | ICD-10-CM | POA: Diagnosis not present

## 2018-06-01 DIAGNOSIS — G4733 Obstructive sleep apnea (adult) (pediatric): Secondary | ICD-10-CM | POA: Diagnosis not present

## 2018-06-01 DIAGNOSIS — R2681 Unsteadiness on feet: Secondary | ICD-10-CM | POA: Diagnosis not present

## 2018-06-01 DIAGNOSIS — E7849 Other hyperlipidemia: Secondary | ICD-10-CM | POA: Diagnosis not present

## 2018-06-01 DIAGNOSIS — Z1331 Encounter for screening for depression: Secondary | ICD-10-CM | POA: Diagnosis not present

## 2018-06-12 DIAGNOSIS — K219 Gastro-esophageal reflux disease without esophagitis: Secondary | ICD-10-CM | POA: Diagnosis not present

## 2018-06-12 DIAGNOSIS — J310 Chronic rhinitis: Secondary | ICD-10-CM | POA: Diagnosis not present

## 2018-06-12 DIAGNOSIS — J029 Acute pharyngitis, unspecified: Secondary | ICD-10-CM | POA: Diagnosis not present

## 2018-06-12 DIAGNOSIS — R1312 Dysphagia, oropharyngeal phase: Secondary | ICD-10-CM | POA: Diagnosis not present

## 2018-06-19 DIAGNOSIS — G4733 Obstructive sleep apnea (adult) (pediatric): Secondary | ICD-10-CM | POA: Diagnosis not present

## 2018-06-19 DIAGNOSIS — M7592 Shoulder lesion, unspecified, left shoulder: Secondary | ICD-10-CM | POA: Diagnosis not present

## 2018-06-19 DIAGNOSIS — I480 Paroxysmal atrial fibrillation: Secondary | ICD-10-CM | POA: Diagnosis not present

## 2018-06-19 DIAGNOSIS — Z7901 Long term (current) use of anticoagulants: Secondary | ICD-10-CM | POA: Diagnosis not present

## 2018-06-19 DIAGNOSIS — R2681 Unsteadiness on feet: Secondary | ICD-10-CM | POA: Diagnosis not present

## 2018-06-19 DIAGNOSIS — M9702XS Periprosthetic fracture around internal prosthetic left hip joint, sequela: Secondary | ICD-10-CM | POA: Diagnosis not present

## 2018-06-19 DIAGNOSIS — M80052S Age-related osteoporosis with current pathological fracture, left femur, sequela: Secondary | ICD-10-CM | POA: Diagnosis not present

## 2018-06-19 DIAGNOSIS — I69354 Hemiplegia and hemiparesis following cerebral infarction affecting left non-dominant side: Secondary | ICD-10-CM | POA: Diagnosis not present

## 2018-06-19 DIAGNOSIS — M19049 Primary osteoarthritis, unspecified hand: Secondary | ICD-10-CM | POA: Diagnosis not present

## 2018-06-19 DIAGNOSIS — I10 Essential (primary) hypertension: Secondary | ICD-10-CM | POA: Diagnosis not present

## 2018-06-19 DIAGNOSIS — I872 Venous insufficiency (chronic) (peripheral): Secondary | ICD-10-CM | POA: Diagnosis not present

## 2018-06-20 DIAGNOSIS — K219 Gastro-esophageal reflux disease without esophagitis: Secondary | ICD-10-CM | POA: Diagnosis not present

## 2018-06-20 DIAGNOSIS — R1312 Dysphagia, oropharyngeal phase: Secondary | ICD-10-CM | POA: Diagnosis not present

## 2018-06-21 DIAGNOSIS — I69354 Hemiplegia and hemiparesis following cerebral infarction affecting left non-dominant side: Secondary | ICD-10-CM | POA: Diagnosis not present

## 2018-06-21 DIAGNOSIS — M80052S Age-related osteoporosis with current pathological fracture, left femur, sequela: Secondary | ICD-10-CM | POA: Diagnosis not present

## 2018-06-21 DIAGNOSIS — M9702XS Periprosthetic fracture around internal prosthetic left hip joint, sequela: Secondary | ICD-10-CM | POA: Diagnosis not present

## 2018-06-21 DIAGNOSIS — R2681 Unsteadiness on feet: Secondary | ICD-10-CM | POA: Diagnosis not present

## 2018-06-21 DIAGNOSIS — M7592 Shoulder lesion, unspecified, left shoulder: Secondary | ICD-10-CM | POA: Diagnosis not present

## 2018-06-21 DIAGNOSIS — I480 Paroxysmal atrial fibrillation: Secondary | ICD-10-CM | POA: Diagnosis not present

## 2018-06-26 DIAGNOSIS — M9702XS Periprosthetic fracture around internal prosthetic left hip joint, sequela: Secondary | ICD-10-CM | POA: Diagnosis not present

## 2018-06-26 DIAGNOSIS — R2681 Unsteadiness on feet: Secondary | ICD-10-CM | POA: Diagnosis not present

## 2018-06-26 DIAGNOSIS — M80052S Age-related osteoporosis with current pathological fracture, left femur, sequela: Secondary | ICD-10-CM | POA: Diagnosis not present

## 2018-06-26 DIAGNOSIS — I69354 Hemiplegia and hemiparesis following cerebral infarction affecting left non-dominant side: Secondary | ICD-10-CM | POA: Diagnosis not present

## 2018-06-26 DIAGNOSIS — M7592 Shoulder lesion, unspecified, left shoulder: Secondary | ICD-10-CM | POA: Diagnosis not present

## 2018-06-26 DIAGNOSIS — I480 Paroxysmal atrial fibrillation: Secondary | ICD-10-CM | POA: Diagnosis not present

## 2018-06-29 DIAGNOSIS — I480 Paroxysmal atrial fibrillation: Secondary | ICD-10-CM | POA: Diagnosis not present

## 2018-06-29 DIAGNOSIS — M7592 Shoulder lesion, unspecified, left shoulder: Secondary | ICD-10-CM | POA: Diagnosis not present

## 2018-06-29 DIAGNOSIS — M80052S Age-related osteoporosis with current pathological fracture, left femur, sequela: Secondary | ICD-10-CM | POA: Diagnosis not present

## 2018-06-29 DIAGNOSIS — M9702XS Periprosthetic fracture around internal prosthetic left hip joint, sequela: Secondary | ICD-10-CM | POA: Diagnosis not present

## 2018-06-29 DIAGNOSIS — R2681 Unsteadiness on feet: Secondary | ICD-10-CM | POA: Diagnosis not present

## 2018-06-29 DIAGNOSIS — I69354 Hemiplegia and hemiparesis following cerebral infarction affecting left non-dominant side: Secondary | ICD-10-CM | POA: Diagnosis not present

## 2018-07-03 DIAGNOSIS — I69354 Hemiplegia and hemiparesis following cerebral infarction affecting left non-dominant side: Secondary | ICD-10-CM | POA: Diagnosis not present

## 2018-07-03 DIAGNOSIS — M7592 Shoulder lesion, unspecified, left shoulder: Secondary | ICD-10-CM | POA: Diagnosis not present

## 2018-07-03 DIAGNOSIS — R2681 Unsteadiness on feet: Secondary | ICD-10-CM | POA: Diagnosis not present

## 2018-07-03 DIAGNOSIS — I480 Paroxysmal atrial fibrillation: Secondary | ICD-10-CM | POA: Diagnosis not present

## 2018-07-03 DIAGNOSIS — M80052S Age-related osteoporosis with current pathological fracture, left femur, sequela: Secondary | ICD-10-CM | POA: Diagnosis not present

## 2018-07-03 DIAGNOSIS — M9702XS Periprosthetic fracture around internal prosthetic left hip joint, sequela: Secondary | ICD-10-CM | POA: Diagnosis not present

## 2018-07-06 DIAGNOSIS — I480 Paroxysmal atrial fibrillation: Secondary | ICD-10-CM | POA: Diagnosis not present

## 2018-07-06 DIAGNOSIS — M9702XS Periprosthetic fracture around internal prosthetic left hip joint, sequela: Secondary | ICD-10-CM | POA: Diagnosis not present

## 2018-07-06 DIAGNOSIS — I69354 Hemiplegia and hemiparesis following cerebral infarction affecting left non-dominant side: Secondary | ICD-10-CM | POA: Diagnosis not present

## 2018-07-06 DIAGNOSIS — M7592 Shoulder lesion, unspecified, left shoulder: Secondary | ICD-10-CM | POA: Diagnosis not present

## 2018-07-06 DIAGNOSIS — R2681 Unsteadiness on feet: Secondary | ICD-10-CM | POA: Diagnosis not present

## 2018-07-06 DIAGNOSIS — Z7901 Long term (current) use of anticoagulants: Secondary | ICD-10-CM | POA: Diagnosis not present

## 2018-07-06 DIAGNOSIS — M80052S Age-related osteoporosis with current pathological fracture, left femur, sequela: Secondary | ICD-10-CM | POA: Diagnosis not present

## 2018-07-10 DIAGNOSIS — M7592 Shoulder lesion, unspecified, left shoulder: Secondary | ICD-10-CM | POA: Diagnosis not present

## 2018-07-10 DIAGNOSIS — I480 Paroxysmal atrial fibrillation: Secondary | ICD-10-CM | POA: Diagnosis not present

## 2018-07-10 DIAGNOSIS — M80052S Age-related osteoporosis with current pathological fracture, left femur, sequela: Secondary | ICD-10-CM | POA: Diagnosis not present

## 2018-07-10 DIAGNOSIS — R2681 Unsteadiness on feet: Secondary | ICD-10-CM | POA: Diagnosis not present

## 2018-07-10 DIAGNOSIS — I69354 Hemiplegia and hemiparesis following cerebral infarction affecting left non-dominant side: Secondary | ICD-10-CM | POA: Diagnosis not present

## 2018-07-10 DIAGNOSIS — M9702XS Periprosthetic fracture around internal prosthetic left hip joint, sequela: Secondary | ICD-10-CM | POA: Diagnosis not present

## 2018-07-12 DIAGNOSIS — R2681 Unsteadiness on feet: Secondary | ICD-10-CM | POA: Diagnosis not present

## 2018-07-12 DIAGNOSIS — I69354 Hemiplegia and hemiparesis following cerebral infarction affecting left non-dominant side: Secondary | ICD-10-CM | POA: Diagnosis not present

## 2018-07-12 DIAGNOSIS — M80052S Age-related osteoporosis with current pathological fracture, left femur, sequela: Secondary | ICD-10-CM | POA: Diagnosis not present

## 2018-07-12 DIAGNOSIS — M7592 Shoulder lesion, unspecified, left shoulder: Secondary | ICD-10-CM | POA: Diagnosis not present

## 2018-07-12 DIAGNOSIS — I480 Paroxysmal atrial fibrillation: Secondary | ICD-10-CM | POA: Diagnosis not present

## 2018-07-12 DIAGNOSIS — M9702XS Periprosthetic fracture around internal prosthetic left hip joint, sequela: Secondary | ICD-10-CM | POA: Diagnosis not present

## 2018-07-17 DIAGNOSIS — R2681 Unsteadiness on feet: Secondary | ICD-10-CM | POA: Diagnosis not present

## 2018-07-17 DIAGNOSIS — M9702XS Periprosthetic fracture around internal prosthetic left hip joint, sequela: Secondary | ICD-10-CM | POA: Diagnosis not present

## 2018-07-17 DIAGNOSIS — I480 Paroxysmal atrial fibrillation: Secondary | ICD-10-CM | POA: Diagnosis not present

## 2018-07-17 DIAGNOSIS — M7592 Shoulder lesion, unspecified, left shoulder: Secondary | ICD-10-CM | POA: Diagnosis not present

## 2018-07-17 DIAGNOSIS — M80052S Age-related osteoporosis with current pathological fracture, left femur, sequela: Secondary | ICD-10-CM | POA: Diagnosis not present

## 2018-07-17 DIAGNOSIS — I69354 Hemiplegia and hemiparesis following cerebral infarction affecting left non-dominant side: Secondary | ICD-10-CM | POA: Diagnosis not present

## 2018-07-19 DIAGNOSIS — R2681 Unsteadiness on feet: Secondary | ICD-10-CM | POA: Diagnosis not present

## 2018-07-19 DIAGNOSIS — M9702XS Periprosthetic fracture around internal prosthetic left hip joint, sequela: Secondary | ICD-10-CM | POA: Diagnosis not present

## 2018-07-19 DIAGNOSIS — G4733 Obstructive sleep apnea (adult) (pediatric): Secondary | ICD-10-CM | POA: Diagnosis not present

## 2018-07-19 DIAGNOSIS — M19049 Primary osteoarthritis, unspecified hand: Secondary | ICD-10-CM | POA: Diagnosis not present

## 2018-07-19 DIAGNOSIS — I480 Paroxysmal atrial fibrillation: Secondary | ICD-10-CM | POA: Diagnosis not present

## 2018-07-19 DIAGNOSIS — I872 Venous insufficiency (chronic) (peripheral): Secondary | ICD-10-CM | POA: Diagnosis not present

## 2018-07-19 DIAGNOSIS — Z7901 Long term (current) use of anticoagulants: Secondary | ICD-10-CM | POA: Diagnosis not present

## 2018-07-19 DIAGNOSIS — I69354 Hemiplegia and hemiparesis following cerebral infarction affecting left non-dominant side: Secondary | ICD-10-CM | POA: Diagnosis not present

## 2018-07-19 DIAGNOSIS — M80052S Age-related osteoporosis with current pathological fracture, left femur, sequela: Secondary | ICD-10-CM | POA: Diagnosis not present

## 2018-07-19 DIAGNOSIS — M7592 Shoulder lesion, unspecified, left shoulder: Secondary | ICD-10-CM | POA: Diagnosis not present

## 2018-07-19 DIAGNOSIS — I10 Essential (primary) hypertension: Secondary | ICD-10-CM | POA: Diagnosis not present

## 2018-07-20 DIAGNOSIS — R2681 Unsteadiness on feet: Secondary | ICD-10-CM | POA: Diagnosis not present

## 2018-07-20 DIAGNOSIS — M9702XS Periprosthetic fracture around internal prosthetic left hip joint, sequela: Secondary | ICD-10-CM | POA: Diagnosis not present

## 2018-07-20 DIAGNOSIS — Z7901 Long term (current) use of anticoagulants: Secondary | ICD-10-CM | POA: Diagnosis not present

## 2018-07-20 DIAGNOSIS — M80052S Age-related osteoporosis with current pathological fracture, left femur, sequela: Secondary | ICD-10-CM | POA: Diagnosis not present

## 2018-07-20 DIAGNOSIS — M7592 Shoulder lesion, unspecified, left shoulder: Secondary | ICD-10-CM | POA: Diagnosis not present

## 2018-07-20 DIAGNOSIS — I480 Paroxysmal atrial fibrillation: Secondary | ICD-10-CM | POA: Diagnosis not present

## 2018-07-20 DIAGNOSIS — I69354 Hemiplegia and hemiparesis following cerebral infarction affecting left non-dominant side: Secondary | ICD-10-CM | POA: Diagnosis not present

## 2018-07-25 DIAGNOSIS — I480 Paroxysmal atrial fibrillation: Secondary | ICD-10-CM | POA: Diagnosis not present

## 2018-07-25 DIAGNOSIS — M80052S Age-related osteoporosis with current pathological fracture, left femur, sequela: Secondary | ICD-10-CM | POA: Diagnosis not present

## 2018-07-25 DIAGNOSIS — M9702XS Periprosthetic fracture around internal prosthetic left hip joint, sequela: Secondary | ICD-10-CM | POA: Diagnosis not present

## 2018-07-25 DIAGNOSIS — R2681 Unsteadiness on feet: Secondary | ICD-10-CM | POA: Diagnosis not present

## 2018-07-25 DIAGNOSIS — I69354 Hemiplegia and hemiparesis following cerebral infarction affecting left non-dominant side: Secondary | ICD-10-CM | POA: Diagnosis not present

## 2018-07-25 DIAGNOSIS — M7592 Shoulder lesion, unspecified, left shoulder: Secondary | ICD-10-CM | POA: Diagnosis not present

## 2018-07-27 DIAGNOSIS — L821 Other seborrheic keratosis: Secondary | ICD-10-CM | POA: Diagnosis not present

## 2018-07-28 DIAGNOSIS — I480 Paroxysmal atrial fibrillation: Secondary | ICD-10-CM | POA: Diagnosis not present

## 2018-07-28 DIAGNOSIS — R2681 Unsteadiness on feet: Secondary | ICD-10-CM | POA: Diagnosis not present

## 2018-07-28 DIAGNOSIS — M80052S Age-related osteoporosis with current pathological fracture, left femur, sequela: Secondary | ICD-10-CM | POA: Diagnosis not present

## 2018-07-28 DIAGNOSIS — I69354 Hemiplegia and hemiparesis following cerebral infarction affecting left non-dominant side: Secondary | ICD-10-CM | POA: Diagnosis not present

## 2018-07-28 DIAGNOSIS — M7592 Shoulder lesion, unspecified, left shoulder: Secondary | ICD-10-CM | POA: Diagnosis not present

## 2018-07-28 DIAGNOSIS — M9702XS Periprosthetic fracture around internal prosthetic left hip joint, sequela: Secondary | ICD-10-CM | POA: Diagnosis not present

## 2018-07-31 DIAGNOSIS — M7592 Shoulder lesion, unspecified, left shoulder: Secondary | ICD-10-CM | POA: Diagnosis not present

## 2018-07-31 DIAGNOSIS — M9702XS Periprosthetic fracture around internal prosthetic left hip joint, sequela: Secondary | ICD-10-CM | POA: Diagnosis not present

## 2018-07-31 DIAGNOSIS — I69354 Hemiplegia and hemiparesis following cerebral infarction affecting left non-dominant side: Secondary | ICD-10-CM | POA: Diagnosis not present

## 2018-07-31 DIAGNOSIS — M80052S Age-related osteoporosis with current pathological fracture, left femur, sequela: Secondary | ICD-10-CM | POA: Diagnosis not present

## 2018-07-31 DIAGNOSIS — R2681 Unsteadiness on feet: Secondary | ICD-10-CM | POA: Diagnosis not present

## 2018-07-31 DIAGNOSIS — I480 Paroxysmal atrial fibrillation: Secondary | ICD-10-CM | POA: Diagnosis not present

## 2018-08-03 DIAGNOSIS — I69354 Hemiplegia and hemiparesis following cerebral infarction affecting left non-dominant side: Secondary | ICD-10-CM | POA: Diagnosis not present

## 2018-08-03 DIAGNOSIS — M80052S Age-related osteoporosis with current pathological fracture, left femur, sequela: Secondary | ICD-10-CM | POA: Diagnosis not present

## 2018-08-03 DIAGNOSIS — I480 Paroxysmal atrial fibrillation: Secondary | ICD-10-CM | POA: Diagnosis not present

## 2018-08-03 DIAGNOSIS — M7592 Shoulder lesion, unspecified, left shoulder: Secondary | ICD-10-CM | POA: Diagnosis not present

## 2018-08-03 DIAGNOSIS — M9702XS Periprosthetic fracture around internal prosthetic left hip joint, sequela: Secondary | ICD-10-CM | POA: Diagnosis not present

## 2018-08-03 DIAGNOSIS — Z7901 Long term (current) use of anticoagulants: Secondary | ICD-10-CM | POA: Diagnosis not present

## 2018-08-03 DIAGNOSIS — R2681 Unsteadiness on feet: Secondary | ICD-10-CM | POA: Diagnosis not present

## 2018-08-07 DIAGNOSIS — M9702XS Periprosthetic fracture around internal prosthetic left hip joint, sequela: Secondary | ICD-10-CM | POA: Diagnosis not present

## 2018-08-07 DIAGNOSIS — R2681 Unsteadiness on feet: Secondary | ICD-10-CM | POA: Diagnosis not present

## 2018-08-07 DIAGNOSIS — I480 Paroxysmal atrial fibrillation: Secondary | ICD-10-CM | POA: Diagnosis not present

## 2018-08-07 DIAGNOSIS — I69354 Hemiplegia and hemiparesis following cerebral infarction affecting left non-dominant side: Secondary | ICD-10-CM | POA: Diagnosis not present

## 2018-08-07 DIAGNOSIS — M7592 Shoulder lesion, unspecified, left shoulder: Secondary | ICD-10-CM | POA: Diagnosis not present

## 2018-08-07 DIAGNOSIS — M80052S Age-related osteoporosis with current pathological fracture, left femur, sequela: Secondary | ICD-10-CM | POA: Diagnosis not present

## 2018-08-10 DIAGNOSIS — M7592 Shoulder lesion, unspecified, left shoulder: Secondary | ICD-10-CM | POA: Diagnosis not present

## 2018-08-10 DIAGNOSIS — M80052S Age-related osteoporosis with current pathological fracture, left femur, sequela: Secondary | ICD-10-CM | POA: Diagnosis not present

## 2018-08-10 DIAGNOSIS — I480 Paroxysmal atrial fibrillation: Secondary | ICD-10-CM | POA: Diagnosis not present

## 2018-08-10 DIAGNOSIS — M9702XS Periprosthetic fracture around internal prosthetic left hip joint, sequela: Secondary | ICD-10-CM | POA: Diagnosis not present

## 2018-08-10 DIAGNOSIS — R2681 Unsteadiness on feet: Secondary | ICD-10-CM | POA: Diagnosis not present

## 2018-08-10 DIAGNOSIS — I69354 Hemiplegia and hemiparesis following cerebral infarction affecting left non-dominant side: Secondary | ICD-10-CM | POA: Diagnosis not present

## 2018-08-14 DIAGNOSIS — M80052S Age-related osteoporosis with current pathological fracture, left femur, sequela: Secondary | ICD-10-CM | POA: Diagnosis not present

## 2018-08-14 DIAGNOSIS — R2681 Unsteadiness on feet: Secondary | ICD-10-CM | POA: Diagnosis not present

## 2018-08-14 DIAGNOSIS — I69354 Hemiplegia and hemiparesis following cerebral infarction affecting left non-dominant side: Secondary | ICD-10-CM | POA: Diagnosis not present

## 2018-08-14 DIAGNOSIS — I480 Paroxysmal atrial fibrillation: Secondary | ICD-10-CM | POA: Diagnosis not present

## 2018-08-14 DIAGNOSIS — M7592 Shoulder lesion, unspecified, left shoulder: Secondary | ICD-10-CM | POA: Diagnosis not present

## 2018-08-14 DIAGNOSIS — M9702XS Periprosthetic fracture around internal prosthetic left hip joint, sequela: Secondary | ICD-10-CM | POA: Diagnosis not present

## 2018-08-15 DIAGNOSIS — M978XXD Periprosthetic fracture around other internal prosthetic joint, subsequent encounter: Secondary | ICD-10-CM | POA: Diagnosis not present

## 2018-08-17 DIAGNOSIS — R2681 Unsteadiness on feet: Secondary | ICD-10-CM | POA: Diagnosis not present

## 2018-08-17 DIAGNOSIS — I69354 Hemiplegia and hemiparesis following cerebral infarction affecting left non-dominant side: Secondary | ICD-10-CM | POA: Diagnosis not present

## 2018-08-17 DIAGNOSIS — I480 Paroxysmal atrial fibrillation: Secondary | ICD-10-CM | POA: Diagnosis not present

## 2018-08-17 DIAGNOSIS — M9702XS Periprosthetic fracture around internal prosthetic left hip joint, sequela: Secondary | ICD-10-CM | POA: Diagnosis not present

## 2018-08-17 DIAGNOSIS — M7592 Shoulder lesion, unspecified, left shoulder: Secondary | ICD-10-CM | POA: Diagnosis not present

## 2018-08-17 DIAGNOSIS — M80052S Age-related osteoporosis with current pathological fracture, left femur, sequela: Secondary | ICD-10-CM | POA: Diagnosis not present

## 2018-08-18 DIAGNOSIS — M19049 Primary osteoarthritis, unspecified hand: Secondary | ICD-10-CM | POA: Diagnosis not present

## 2018-08-18 DIAGNOSIS — M80052S Age-related osteoporosis with current pathological fracture, left femur, sequela: Secondary | ICD-10-CM | POA: Diagnosis not present

## 2018-08-18 DIAGNOSIS — I872 Venous insufficiency (chronic) (peripheral): Secondary | ICD-10-CM | POA: Diagnosis not present

## 2018-08-18 DIAGNOSIS — R2681 Unsteadiness on feet: Secondary | ICD-10-CM | POA: Diagnosis not present

## 2018-08-18 DIAGNOSIS — M7592 Shoulder lesion, unspecified, left shoulder: Secondary | ICD-10-CM | POA: Diagnosis not present

## 2018-08-18 DIAGNOSIS — G4733 Obstructive sleep apnea (adult) (pediatric): Secondary | ICD-10-CM | POA: Diagnosis not present

## 2018-08-18 DIAGNOSIS — Z7901 Long term (current) use of anticoagulants: Secondary | ICD-10-CM | POA: Diagnosis not present

## 2018-08-18 DIAGNOSIS — I4891 Unspecified atrial fibrillation: Secondary | ICD-10-CM | POA: Diagnosis not present

## 2018-08-18 DIAGNOSIS — M9702XS Periprosthetic fracture around internal prosthetic left hip joint, sequela: Secondary | ICD-10-CM | POA: Diagnosis not present

## 2018-08-18 DIAGNOSIS — I10 Essential (primary) hypertension: Secondary | ICD-10-CM | POA: Diagnosis not present

## 2018-08-18 DIAGNOSIS — I69354 Hemiplegia and hemiparesis following cerebral infarction affecting left non-dominant side: Secondary | ICD-10-CM | POA: Diagnosis not present

## 2018-08-21 DIAGNOSIS — M7592 Shoulder lesion, unspecified, left shoulder: Secondary | ICD-10-CM | POA: Diagnosis not present

## 2018-08-21 DIAGNOSIS — M80052S Age-related osteoporosis with current pathological fracture, left femur, sequela: Secondary | ICD-10-CM | POA: Diagnosis not present

## 2018-08-21 DIAGNOSIS — I69354 Hemiplegia and hemiparesis following cerebral infarction affecting left non-dominant side: Secondary | ICD-10-CM | POA: Diagnosis not present

## 2018-08-21 DIAGNOSIS — M9702XS Periprosthetic fracture around internal prosthetic left hip joint, sequela: Secondary | ICD-10-CM | POA: Diagnosis not present

## 2018-08-21 DIAGNOSIS — R2681 Unsteadiness on feet: Secondary | ICD-10-CM | POA: Diagnosis not present

## 2018-08-21 DIAGNOSIS — I4891 Unspecified atrial fibrillation: Secondary | ICD-10-CM | POA: Diagnosis not present

## 2018-08-25 ENCOUNTER — Telehealth: Payer: Self-pay | Admitting: Cardiology

## 2018-08-25 NOTE — Telephone Encounter (Signed)
Pts husband calling today with questions regarding her virtual visit with Dr Marlou Porch on Monday June 1. I advised him to have a fresh set of vitals and medication list ready for when the CMA contacts her that day. He has a smart phone and is willing to try a video call. I told him I was not exactly sure of the video platform Dr Marlou Porch will use, but the CMA will explain that to him when he calls to set pt up for his visit.   He verbalized understanding and had no additional questions.

## 2018-08-25 NOTE — Telephone Encounter (Signed)
New Message   Patient has question about virtual visit scheduled 08/28/18 and needs help to get setup for appt please give them a call.

## 2018-08-28 ENCOUNTER — Telehealth: Payer: Self-pay

## 2018-08-28 ENCOUNTER — Encounter: Payer: Self-pay | Admitting: Cardiology

## 2018-08-28 ENCOUNTER — Telehealth (INDEPENDENT_AMBULATORY_CARE_PROVIDER_SITE_OTHER): Payer: Medicare Other | Admitting: Cardiology

## 2018-08-28 ENCOUNTER — Other Ambulatory Visit: Payer: Self-pay

## 2018-08-28 VITALS — BP 120/60 | HR 48 | Ht 64.5 in | Wt 156.0 lb

## 2018-08-28 DIAGNOSIS — M7592 Shoulder lesion, unspecified, left shoulder: Secondary | ICD-10-CM | POA: Diagnosis not present

## 2018-08-28 DIAGNOSIS — R2681 Unsteadiness on feet: Secondary | ICD-10-CM | POA: Diagnosis not present

## 2018-08-28 DIAGNOSIS — I4891 Unspecified atrial fibrillation: Secondary | ICD-10-CM | POA: Diagnosis not present

## 2018-08-28 DIAGNOSIS — I480 Paroxysmal atrial fibrillation: Secondary | ICD-10-CM | POA: Diagnosis not present

## 2018-08-28 DIAGNOSIS — M9702XS Periprosthetic fracture around internal prosthetic left hip joint, sequela: Secondary | ICD-10-CM | POA: Diagnosis not present

## 2018-08-28 DIAGNOSIS — E7800 Pure hypercholesterolemia, unspecified: Secondary | ICD-10-CM

## 2018-08-28 DIAGNOSIS — G459 Transient cerebral ischemic attack, unspecified: Secondary | ICD-10-CM

## 2018-08-28 DIAGNOSIS — R001 Bradycardia, unspecified: Secondary | ICD-10-CM

## 2018-08-28 DIAGNOSIS — I69354 Hemiplegia and hemiparesis following cerebral infarction affecting left non-dominant side: Secondary | ICD-10-CM | POA: Diagnosis not present

## 2018-08-28 DIAGNOSIS — M80052S Age-related osteoporosis with current pathological fracture, left femur, sequela: Secondary | ICD-10-CM | POA: Diagnosis not present

## 2018-08-28 DIAGNOSIS — N183 Chronic kidney disease, stage 3 unspecified: Secondary | ICD-10-CM

## 2018-08-28 MED ORDER — METOPROLOL SUCCINATE ER 25 MG PO TB24: 25.0000 mg | ORAL_TABLET | Freq: Every day | ORAL | 11 refills | Status: DC

## 2018-08-28 NOTE — Patient Instructions (Signed)
Medication Instructions:  Please decrease Toprol 25 mg to once daily. Continue all other medications as listed.  If you need a refill on your cardiac medications before your next appointment, please call your pharmacy.   Follow-Up: Follow up in 1 year with Dr. Marlou Porch.  You will receive a letter in the mail 2 months before you are due.  Please call us when you receive this letter to schedule your follow up appointment.  Thank you for choosing [facility]!!

## 2018-08-28 NOTE — Telephone Encounter (Signed)
YOUR CARDIOLOGY TEAM HAS ARRANGED FOR AN E-VISIT FOR YOUR APPOINTMENT - PLEASE REVIEW IMPORTANT INFORMATION BELOW SEVERAL DAYS PRIOR TO YOUR APPOINTMENT  Due to the recent COVID-19 pandemic, we are transitioning in-person office visits to tele-medicine visits in an effort to decrease unnecessary exposure to our patients, their families, and staff. These visits are billed to your insurance just like a normal visit is. We also encourage you to sign up for MyChart if you have not already done so. You will need a smartphone if possible. For patients that do not have this, we can still complete the visit using a regular telephone but do prefer a smartphone to enable video when possible. You may have a family member that lives with you that can help. If possible, we also ask that you have a blood pressure cuff and scale at home to measure your blood pressure, heart rate and weight prior to your scheduled appointment. Patients with clinical needs that need an in-person evaluation and testing will still be able to come to the office if absolutely necessary. If you have any questions, feel free to call our office.     YOUR PROVIDER WILL BE USING THE FOLLOWING PLATFORM TO COMPLETE YOUR VISIT: [email or web address]  . IF USING MYCHART - How to Download the MyChart App to Your SmartPhone   - If Apple, go to App Store and type in MyChart in the search bar and download the app. If Android, ask patient to go to Google Play Store and type in MyChart in the search bar and download the app. The app is free but as with any other app downloads, your phone may require you to verify saved payment information or Apple/Android password.  - You will need to then log into the app with your MyChart username and password, and select [facility] as your healthcare provider to link the account.  - When it is time for your visit, go to the MyChart app, find appointments, and click Begin Video Visit. Be sure to Select Allow for your device to  access the Microphone and Camera for your visit. You will then be connected, and your provider will be with you shortly.  **If you have any issues connecting or need assistance, please contact MyChart service desk [phone] ([phone])**  **If using a computer, in order to ensure the best quality for your visit, you will need to use either of the following Internet Browsers: Google Chrome or Microsoft Edge**  . IF USING DOXIMITY or [email or web address].ME - The staff will give you instructions on receiving your link to join the meeting the day of your visit.      2-3 DAYS BEFORE YOUR APPOINTMENT  You will receive a telephone call from one of our [facility] team members - your caller ID may say "Unknown caller." If this is a video visit, we will walk you through how to get the video launched on your phone. We will remind you check your blood pressure, heart rate and weight prior to your scheduled appointment. If you have an Apple Watch or Kardia, please upload any pertinent ECG strips the day before or morning of your appointment to MyChart. Our staff will also make sure you have reviewed the consent and agree to move forward with your scheduled tele-health visit.     THE DAY OF YOUR APPOINTMENT  Approximately 15 minutes prior to your scheduled appointment, you will receive a telephone call from one of HeartCare team - your caller ID may say "Unknown caller."    Our staff will confirm medications, vital signs for the day and any symptoms you may be experiencing. Please have this information available prior to the time of visit start. It may also be helpful for you to have a pad of paper and pen handy for any instructions given during your visit. They will also walk you through joining the smartphone meeting if this is a video visit.    CONSENT FOR TELE-HEALTH VISIT - PLEASE REVIEW  I hereby voluntarily request, consent and authorize [facility] and its employed or contracted physicians, physician  assistants, nurse practitioners or other licensed health care professionals (the Practitioner), to provide me with telemedicine health care services (the "Services") as deemed necessary by the treating Practitioner. I acknowledge and consent to receive the Services by the Practitioner via telemedicine. I understand that the telemedicine visit will involve communicating with the Practitioner through live audiovisual communication technology and the disclosure of certain medical information by electronic transmission. I acknowledge that I have been given the opportunity to request an in-person assessment or other available alternative prior to the telemedicine visit and am voluntarily participating in the telemedicine visit.  I understand that I have the right to withhold or withdraw my consent to the use of telemedicine in the course of my care at any time, without affecting my right to future care or treatment, and that the Practitioner or I may terminate the telemedicine visit at any time. I understand that I have the right to inspect all information obtained and/or recorded in the course of the telemedicine visit and may receive copies of available information for a reasonable fee.  I understand that some of the potential risks of receiving the Services via telemedicine include:  . Delay or interruption in medical evaluation due to technological equipment failure or disruption; . Information transmitted may not be sufficient (e.g. poor resolution of images) to allow for appropriate medical decision making by the Practitioner; and/or  . In rare instances, security protocols could fail, causing a breach of personal health information.  Furthermore, I acknowledge that it is my responsibility to provide information about my medical history, conditions and care that is complete and accurate to the best of my ability. I acknowledge that Practitioner's advice, recommendations, and/or decision may be based on  factors not within their control, such as incomplete or inaccurate data provided by me or distortions of diagnostic images or specimens that may result from electronic transmissions. I understand that the practice of medicine is not an exact science and that Practitioner makes no warranties or guarantees regarding treatment outcomes. I acknowledge that I will receive a copy of this consent concurrently upon execution via email to the email address I last provided but may also request a printed copy by calling the office of [facility].    I understand that my insurance will be billed for this visit.   I have read or had this consent read to me. . I understand the contents of this consent, which adequately explains the benefits and risks of the Services being provided via telemedicine.  . I have been provided ample opportunity to ask questions regarding this consent and the Services and have had my questions answered to my satisfaction. . I give my informed consent for the services to be provided through the use of telemedicine in my medical care  By participating in this telemedicine visit I agree to the above.  

## 2018-08-28 NOTE — Progress Notes (Signed)
Virtual Visit via Telephone Note   This visit type was conducted due to national recommendations for restrictions regarding the COVID-19 Pandemic (e.g. social distancing) in an effort to limit this patient's exposure and mitigate transmission in our community.  Due to her co-morbid illnesses, this patient is at least at moderate risk for complications without adequate follow up.  This format is felt to be most appropriate for this patient at this time.  The patient did not have access to video technology/had technical difficulties with video requiring transitioning to audio format only (telephone).  All issues noted in this document were discussed and addressed.  No physical exam could be performed with this format.  Please refer to the patient's chart for her  consent to telehealth for [facility].   Date:  08/28/2018   ID:  [name], DOB Jun 14, 1933, MRN [id number]  Patient Location: Home Provider Location: Home  PCP:  Leanna Battles, MD  Cardiologist:  Candee Furbish, MD  Electrophysiologist:  None   Evaluation Performed:  New Patient Evaluation  Chief Complaint: New patient evaluation, former patient of Dr. Thurman Coyer with paroxysmal atrial fibrillation  History of Present Illness:    [name] is a 85 y.o. female with paroxysmal atrial fibrillation anticoagulation TIA GERD here to establish care, former Dr. Wynonia Lawman patient.  Echocardiogram in 2010 showed normal EF.  Has been on warfarin for atrial fibrillation.  HR is 48 now but used to 60. Decreased to Toprol 25mg  PO QD. Stairs, balance. Walk with cane trying.   Overall not having any chest pain fevers chills nausea vomiting syncope bleeding.  Was on phone visit with her husband.  The patient does not have symptoms concerning for COVID-19 infection (fever, chills, cough, or new shortness of breath).    Past Medical History:  Diagnosis Date  . Arthritis    "fingers, hips, feet, ankles" (11/10/2012)  . Asthma   . Atrial  fibrillation (HCC)    CHRONIC COUMADIN  . Breast cancer ([place]) 1990's   "cancer on one side; precancerous tissue on the other" (11/10/2012)  . Chronic bronchitis ([place])    "multiple times; not in a long time" (11/10/2012  . Depression   . GERD (gastroesophageal reflux disease)   . Hearing impaired   . Hypertension   . Irritable bowel   . Lymphedema    HX OF - IN LEFT ARM--NO NEEDLES OR B/P'S LEFT ARM  . Macular degeneration    "BEGINNINGS OF" MACULAR DEGENERATION  . Osteoporosis   . Pneumonia    "multiple times; not in a long time" (11/10/2012)  . Recurrent UTI   . Sleep apnea    "dx'd w/it; don't wear mask or anything" (11/10/2012)  . Stroke ([place]) ~ 2005   HX OF TIA-NO RESIDUAL PROBLEM--PARALYSIS RT SIDE FACE /PT'S MOUTH DROOPS-AND LOSS OF HEARING RT EAR --SINCE EAR SURGERY AS A CHILD   . UTI (lower urinary tract infection)    FREQUENT   Past Surgical History:  Procedure Laterality Date  . BREAST BIOPSY Bilateral 1990's  . Dacryocystorhinostomy  02/18/2016  . INNER EAR SURGERY Right    MULTIPLE EAR SURGERIES,  . JOINT REPLACEMENT    . KNEE ARTHROSCOPY  04/07/2012   Procedure: ARTHROSCOPY KNEE;  Surgeon: Gearlean Alf, MD;  Location: [facility];  Service: Orthopedics;  Laterality: Left;  WITH SYNOVECTOMY  . MASTECTOMY Bilateral 1990's  . MASTOIDECTOMY Right 1942  . ORIF FEMUR FRACTURE Left 08/08/2017   Procedure: OPEN REDUCTION INTERNAL FIXATION (ORIF) DISTAL FEMUR  FRACTURE;  Surgeon: Shona Needles, MD;  Location: [facility];  Service: Orthopedics;  Laterality: Left;  . TONSILLECTOMY    . TOTAL KNEE ARTHROPLASTY  09/20/2011   LEFT TOTAL KNEE ARTHROPLASTY;  Surgeon: Gearlean Alf, MD;  Location: [facility];  Service: Orthopedics;  Laterality: Left;  . TOTAL KNEE ARTHROPLASTY  2006     Current Meds  Medication Sig  . atorvastatin (LIPITOR) 20 MG tablet Take 20 mg by mouth daily after supper.   . chlorthalidone (HYGROTON) 50 MG tablet Take 50 mg by mouth daily.   Marland Kitchen  CRANBERRY PO Take 1 capsule by mouth 2 (two) times daily.   . diphenhydramine-acetaminophen (TYLENOL PM) 25-500 MG TABS tablet Take 1 tablet by mouth at bedtime.  Marland Kitchen estradiol (ESTRACE) 0.1 MG/GM vaginal cream Place 1 Applicatorful vaginally See admin instructions. Apply vaginally twice weekly  . loperamide (IMODIUM) 2 MG capsule Take 2-4 mg by mouth 5 (five) times daily as needed for diarrhea or loose stools.   Marland Kitchen losartan (COZAAR) 100 MG tablet Take 100 mg by mouth daily.  . mirabegron ER (MYRBETRIQ) 25 MG TB24 tablet Take 25 mg by mouth every evening.  . Multiple Vitamin (MULTIVITAMIN WITH MINERALS) TABS tablet Take 1 tablet by mouth daily.  . Multiple Vitamins-Minerals (PRESERVISION/LUTEIN) CAPS Take 1 capsule by mouth daily.   . pantoprazole (PROTONIX) 40 MG tablet Take 80 mg by mouth daily.   Vladimir Faster Glycol-Propyl Glycol (SYSTANE) 0.4-0.3 % GEL ophthalmic gel Place 1 application into both eyes See admin instructions. Apply to both eyes every other night at bedtime (alternate with drops)  . Probiotic Product (VSL#3 PO) Take 1 capsule by mouth daily.   . ranitidine (ZANTAC) 150 MG capsule Take 150 mg by mouth daily as needed for heartburn.   . saccharomyces boulardii (FLORASTOR) 250 MG capsule Take 250-500 mg by mouth See admin instructions. Take one tablet (250 mg) by mouth every morning and two tablets (500 mg) at night  . warfarin (COUMADIN) 5 MG tablet Take 5 mg by mouth daily with supper. 5mg  by mouth daily, except 7.5mg  on Saturdays  . [DISCONTINUED] metoprolol succinate (TOPROL-XL) 25 MG 24 hr tablet Take 1 tablet (25 mg total) by mouth daily. (Patient taking differently: Take 25 mg by mouth 2 (two) times a day. )     Allergies:   Sulfamethoxazole; Ampicillin; Clindamycin/lincomycin; Latex; Pradaxa [dabigatran etexilate mesylate]; and Ciprofloxacin   Social History   Tobacco Use  . Smoking status: Never Smoker  . Smokeless tobacco: Never Used  Substance Use Topics  . Alcohol  use: No  . Drug use: No     Family Hx: The patient's family history includes CAD in her father and mother; Stroke in her mother.  ROS:   Please see the history of present illness.    Denies any fevers chills nausea vomiting syncope bleeding All other systems reviewed and are negative.   Prior CV studies:   The following studies were reviewed today:  Prior Holter monitor 2014, echocardiogram 2012-normal EF  Labs/Other Tests and Data Reviewed:    EKG:  An ECG dated 08/06/17 was personally reviewed today and demonstrated:  NSR NSSTW changes  Recent Labs: No results found for requested labs within last 8760 hours.   Recent Lipid Panel No results found for: CHOL, TRIG, HDL, CHOLHDL, LDLCALC, LDLDIRECT   Total cholesterol 157 LDL 87 HDL 49  Wt Readings from Last 3 Encounters:  08/28/18 156 lb (70.8 kg)  08/06/17 161 lb (73 kg)  01/17/17  160 lb (72.6 kg)     Objective:    Vital Signs:  BP 120/60   Pulse (!) 48   Ht 5' 4.5" (1.638 m)   Wt 156 lb (70.8 kg)   BMI 26.36 kg/m    VITAL SIGNS:  reviewed GEN:  no acute distress PSYCH:  normal affect  ASSESSMENT & PLAN:    Paroxysmal atrial fibrillation - At last visit with Dr. Wynonia Lawman on 07/15/2016 she was in sinus rhythm. -On warfarin - Dr. Sharlett Iles.  We discussed with her the possibility of transition to Eliquis, she desires to continue with warfarin, she likes the frequent checks.  TIA - At the last visit with Dr. Wynonia Lawman there was some concern over possible TIA. .  Bradycardia - Reduce to Toprol XL 25mg  PO QD. asymptomatic but heart rate has been calculated at 48 during nurse visits.  Prosthetic hip fracture - She was discharged on 08/11/2017 with periprosthetic hip fracture.  Underwent surgery and discharged to skilled nursing facility at that time.  Chronic kidney disease stage III -Baseline about 1.5 creatinine.  Hyperlipidemia -Statin therapy  COVID-19 Education: The signs and symptoms of COVID-19 were  discussed with the patient and how to seek care for testing (follow up with PCP or arrange E-visit).  The importance of social distancing was discussed today.  Time:   Today, I have spent 19 minutes with the patient with telehealth technology discussing the above problems.     Medication Adjustments/Labs and Tests Ordered: Current medicines are reviewed at length with the patient today.  Concerns regarding medicines are outlined above.   Tests Ordered: No orders of the defined types were placed in this encounter.   Medication Changes: Meds ordered this encounter  Medications  . metoprolol succinate (TOPROL-XL) 25 MG 24 hr tablet    Sig: Take 1 tablet (25 mg total) by mouth daily.    Dispense:  30 tablet    Refill:  11    Pt will call when ready to have filled    Disposition:  Follow up in 1 year(s)  Signed, Candee Furbish, MD  08/28/2018 2:41 PM    [place]

## 2018-09-01 DIAGNOSIS — M9702XS Periprosthetic fracture around internal prosthetic left hip joint, sequela: Secondary | ICD-10-CM | POA: Diagnosis not present

## 2018-09-01 DIAGNOSIS — R2681 Unsteadiness on feet: Secondary | ICD-10-CM | POA: Diagnosis not present

## 2018-09-01 DIAGNOSIS — M7592 Shoulder lesion, unspecified, left shoulder: Secondary | ICD-10-CM | POA: Diagnosis not present

## 2018-09-01 DIAGNOSIS — I69354 Hemiplegia and hemiparesis following cerebral infarction affecting left non-dominant side: Secondary | ICD-10-CM | POA: Diagnosis not present

## 2018-09-01 DIAGNOSIS — I4891 Unspecified atrial fibrillation: Secondary | ICD-10-CM | POA: Diagnosis not present

## 2018-09-01 DIAGNOSIS — M80052S Age-related osteoporosis with current pathological fracture, left femur, sequela: Secondary | ICD-10-CM | POA: Diagnosis not present

## 2018-09-04 DIAGNOSIS — I4891 Unspecified atrial fibrillation: Secondary | ICD-10-CM | POA: Diagnosis not present

## 2018-09-04 DIAGNOSIS — M9702XS Periprosthetic fracture around internal prosthetic left hip joint, sequela: Secondary | ICD-10-CM | POA: Diagnosis not present

## 2018-09-04 DIAGNOSIS — M7592 Shoulder lesion, unspecified, left shoulder: Secondary | ICD-10-CM | POA: Diagnosis not present

## 2018-09-04 DIAGNOSIS — I69354 Hemiplegia and hemiparesis following cerebral infarction affecting left non-dominant side: Secondary | ICD-10-CM | POA: Diagnosis not present

## 2018-09-04 DIAGNOSIS — R2681 Unsteadiness on feet: Secondary | ICD-10-CM | POA: Diagnosis not present

## 2018-09-04 DIAGNOSIS — M80052S Age-related osteoporosis with current pathological fracture, left femur, sequela: Secondary | ICD-10-CM | POA: Diagnosis not present

## 2018-09-05 DIAGNOSIS — I480 Paroxysmal atrial fibrillation: Secondary | ICD-10-CM | POA: Diagnosis not present

## 2018-09-05 DIAGNOSIS — Z7901 Long term (current) use of anticoagulants: Secondary | ICD-10-CM | POA: Diagnosis not present

## 2018-09-07 DIAGNOSIS — M7592 Shoulder lesion, unspecified, left shoulder: Secondary | ICD-10-CM | POA: Diagnosis not present

## 2018-09-07 DIAGNOSIS — I4891 Unspecified atrial fibrillation: Secondary | ICD-10-CM | POA: Diagnosis not present

## 2018-09-07 DIAGNOSIS — M9702XS Periprosthetic fracture around internal prosthetic left hip joint, sequela: Secondary | ICD-10-CM | POA: Diagnosis not present

## 2018-09-07 DIAGNOSIS — R2681 Unsteadiness on feet: Secondary | ICD-10-CM | POA: Diagnosis not present

## 2018-09-07 DIAGNOSIS — M80052S Age-related osteoporosis with current pathological fracture, left femur, sequela: Secondary | ICD-10-CM | POA: Diagnosis not present

## 2018-09-07 DIAGNOSIS — I69354 Hemiplegia and hemiparesis following cerebral infarction affecting left non-dominant side: Secondary | ICD-10-CM | POA: Diagnosis not present

## 2018-09-11 DIAGNOSIS — I4891 Unspecified atrial fibrillation: Secondary | ICD-10-CM | POA: Diagnosis not present

## 2018-09-11 DIAGNOSIS — I69354 Hemiplegia and hemiparesis following cerebral infarction affecting left non-dominant side: Secondary | ICD-10-CM | POA: Diagnosis not present

## 2018-09-11 DIAGNOSIS — M7592 Shoulder lesion, unspecified, left shoulder: Secondary | ICD-10-CM | POA: Diagnosis not present

## 2018-09-11 DIAGNOSIS — R2681 Unsteadiness on feet: Secondary | ICD-10-CM | POA: Diagnosis not present

## 2018-09-11 DIAGNOSIS — M9702XS Periprosthetic fracture around internal prosthetic left hip joint, sequela: Secondary | ICD-10-CM | POA: Diagnosis not present

## 2018-09-11 DIAGNOSIS — M80052S Age-related osteoporosis with current pathological fracture, left femur, sequela: Secondary | ICD-10-CM | POA: Diagnosis not present

## 2018-09-13 DIAGNOSIS — R2681 Unsteadiness on feet: Secondary | ICD-10-CM | POA: Diagnosis not present

## 2018-09-13 DIAGNOSIS — I69354 Hemiplegia and hemiparesis following cerebral infarction affecting left non-dominant side: Secondary | ICD-10-CM | POA: Diagnosis not present

## 2018-09-13 DIAGNOSIS — I4891 Unspecified atrial fibrillation: Secondary | ICD-10-CM | POA: Diagnosis not present

## 2018-09-13 DIAGNOSIS — M9702XS Periprosthetic fracture around internal prosthetic left hip joint, sequela: Secondary | ICD-10-CM | POA: Diagnosis not present

## 2018-09-13 DIAGNOSIS — Z7901 Long term (current) use of anticoagulants: Secondary | ICD-10-CM | POA: Diagnosis not present

## 2018-09-13 DIAGNOSIS — I480 Paroxysmal atrial fibrillation: Secondary | ICD-10-CM | POA: Diagnosis not present

## 2018-09-13 DIAGNOSIS — M80052S Age-related osteoporosis with current pathological fracture, left femur, sequela: Secondary | ICD-10-CM | POA: Diagnosis not present

## 2018-09-13 DIAGNOSIS — M7592 Shoulder lesion, unspecified, left shoulder: Secondary | ICD-10-CM | POA: Diagnosis not present

## 2018-09-17 DIAGNOSIS — I872 Venous insufficiency (chronic) (peripheral): Secondary | ICD-10-CM | POA: Diagnosis not present

## 2018-09-17 DIAGNOSIS — M9702XS Periprosthetic fracture around internal prosthetic left hip joint, sequela: Secondary | ICD-10-CM | POA: Diagnosis not present

## 2018-09-17 DIAGNOSIS — Z7901 Long term (current) use of anticoagulants: Secondary | ICD-10-CM | POA: Diagnosis not present

## 2018-09-17 DIAGNOSIS — I4891 Unspecified atrial fibrillation: Secondary | ICD-10-CM | POA: Diagnosis not present

## 2018-09-17 DIAGNOSIS — M7592 Shoulder lesion, unspecified, left shoulder: Secondary | ICD-10-CM | POA: Diagnosis not present

## 2018-09-17 DIAGNOSIS — M80052S Age-related osteoporosis with current pathological fracture, left femur, sequela: Secondary | ICD-10-CM | POA: Diagnosis not present

## 2018-09-17 DIAGNOSIS — I69354 Hemiplegia and hemiparesis following cerebral infarction affecting left non-dominant side: Secondary | ICD-10-CM | POA: Diagnosis not present

## 2018-09-17 DIAGNOSIS — R2681 Unsteadiness on feet: Secondary | ICD-10-CM | POA: Diagnosis not present

## 2018-09-17 DIAGNOSIS — G4733 Obstructive sleep apnea (adult) (pediatric): Secondary | ICD-10-CM | POA: Diagnosis not present

## 2018-09-17 DIAGNOSIS — I10 Essential (primary) hypertension: Secondary | ICD-10-CM | POA: Diagnosis not present

## 2018-09-17 DIAGNOSIS — M19049 Primary osteoarthritis, unspecified hand: Secondary | ICD-10-CM | POA: Diagnosis not present

## 2018-09-19 DIAGNOSIS — M80052S Age-related osteoporosis with current pathological fracture, left femur, sequela: Secondary | ICD-10-CM | POA: Diagnosis not present

## 2018-09-19 DIAGNOSIS — M9702XS Periprosthetic fracture around internal prosthetic left hip joint, sequela: Secondary | ICD-10-CM | POA: Diagnosis not present

## 2018-09-19 DIAGNOSIS — M7592 Shoulder lesion, unspecified, left shoulder: Secondary | ICD-10-CM | POA: Diagnosis not present

## 2018-09-19 DIAGNOSIS — I4891 Unspecified atrial fibrillation: Secondary | ICD-10-CM | POA: Diagnosis not present

## 2018-09-19 DIAGNOSIS — R2681 Unsteadiness on feet: Secondary | ICD-10-CM | POA: Diagnosis not present

## 2018-09-19 DIAGNOSIS — I69354 Hemiplegia and hemiparesis following cerebral infarction affecting left non-dominant side: Secondary | ICD-10-CM | POA: Diagnosis not present

## 2018-09-23 DIAGNOSIS — M80052S Age-related osteoporosis with current pathological fracture, left femur, sequela: Secondary | ICD-10-CM | POA: Diagnosis not present

## 2018-09-23 DIAGNOSIS — M9702XS Periprosthetic fracture around internal prosthetic left hip joint, sequela: Secondary | ICD-10-CM | POA: Diagnosis not present

## 2018-09-23 DIAGNOSIS — I69354 Hemiplegia and hemiparesis following cerebral infarction affecting left non-dominant side: Secondary | ICD-10-CM | POA: Diagnosis not present

## 2018-09-23 DIAGNOSIS — M7592 Shoulder lesion, unspecified, left shoulder: Secondary | ICD-10-CM | POA: Diagnosis not present

## 2018-09-23 DIAGNOSIS — R2681 Unsteadiness on feet: Secondary | ICD-10-CM | POA: Diagnosis not present

## 2018-09-23 DIAGNOSIS — I4891 Unspecified atrial fibrillation: Secondary | ICD-10-CM | POA: Diagnosis not present

## 2018-09-27 DIAGNOSIS — R2681 Unsteadiness on feet: Secondary | ICD-10-CM | POA: Diagnosis not present

## 2018-09-27 DIAGNOSIS — M80052S Age-related osteoporosis with current pathological fracture, left femur, sequela: Secondary | ICD-10-CM | POA: Diagnosis not present

## 2018-09-27 DIAGNOSIS — I4891 Unspecified atrial fibrillation: Secondary | ICD-10-CM | POA: Diagnosis not present

## 2018-09-27 DIAGNOSIS — M7592 Shoulder lesion, unspecified, left shoulder: Secondary | ICD-10-CM | POA: Diagnosis not present

## 2018-09-27 DIAGNOSIS — I69354 Hemiplegia and hemiparesis following cerebral infarction affecting left non-dominant side: Secondary | ICD-10-CM | POA: Diagnosis not present

## 2018-09-27 DIAGNOSIS — M9702XS Periprosthetic fracture around internal prosthetic left hip joint, sequela: Secondary | ICD-10-CM | POA: Diagnosis not present

## 2018-10-02 DIAGNOSIS — I69354 Hemiplegia and hemiparesis following cerebral infarction affecting left non-dominant side: Secondary | ICD-10-CM | POA: Diagnosis not present

## 2018-10-02 DIAGNOSIS — R2681 Unsteadiness on feet: Secondary | ICD-10-CM | POA: Diagnosis not present

## 2018-10-02 DIAGNOSIS — I4891 Unspecified atrial fibrillation: Secondary | ICD-10-CM | POA: Diagnosis not present

## 2018-10-02 DIAGNOSIS — M9702XS Periprosthetic fracture around internal prosthetic left hip joint, sequela: Secondary | ICD-10-CM | POA: Diagnosis not present

## 2018-10-02 DIAGNOSIS — M80052S Age-related osteoporosis with current pathological fracture, left femur, sequela: Secondary | ICD-10-CM | POA: Diagnosis not present

## 2018-10-02 DIAGNOSIS — M7592 Shoulder lesion, unspecified, left shoulder: Secondary | ICD-10-CM | POA: Diagnosis not present

## 2018-10-03 DIAGNOSIS — Z7901 Long term (current) use of anticoagulants: Secondary | ICD-10-CM | POA: Diagnosis not present

## 2018-10-03 DIAGNOSIS — I480 Paroxysmal atrial fibrillation: Secondary | ICD-10-CM | POA: Diagnosis not present

## 2018-10-17 DIAGNOSIS — I480 Paroxysmal atrial fibrillation: Secondary | ICD-10-CM | POA: Diagnosis not present

## 2018-10-17 DIAGNOSIS — R399 Unspecified symptoms and signs involving the genitourinary system: Secondary | ICD-10-CM | POA: Diagnosis not present

## 2018-10-17 DIAGNOSIS — Z7901 Long term (current) use of anticoagulants: Secondary | ICD-10-CM | POA: Diagnosis not present

## 2018-11-01 DIAGNOSIS — I480 Paroxysmal atrial fibrillation: Secondary | ICD-10-CM | POA: Diagnosis not present

## 2018-11-01 DIAGNOSIS — Z7901 Long term (current) use of anticoagulants: Secondary | ICD-10-CM | POA: Diagnosis not present

## 2018-12-06 DIAGNOSIS — R399 Unspecified symptoms and signs involving the genitourinary system: Secondary | ICD-10-CM | POA: Diagnosis not present

## 2018-12-12 DIAGNOSIS — I480 Paroxysmal atrial fibrillation: Secondary | ICD-10-CM | POA: Diagnosis not present

## 2018-12-12 DIAGNOSIS — Z7901 Long term (current) use of anticoagulants: Secondary | ICD-10-CM | POA: Diagnosis not present

## 2018-12-12 DIAGNOSIS — Z23 Encounter for immunization: Secondary | ICD-10-CM | POA: Diagnosis not present

## 2018-12-21 DIAGNOSIS — R399 Unspecified symptoms and signs involving the genitourinary system: Secondary | ICD-10-CM | POA: Diagnosis not present

## 2019-01-05 DIAGNOSIS — R399 Unspecified symptoms and signs involving the genitourinary system: Secondary | ICD-10-CM | POA: Diagnosis not present

## 2019-01-16 DIAGNOSIS — R399 Unspecified symptoms and signs involving the genitourinary system: Secondary | ICD-10-CM | POA: Diagnosis not present

## 2019-01-18 DIAGNOSIS — Z87440 Personal history of urinary (tract) infections: Secondary | ICD-10-CM | POA: Diagnosis not present

## 2019-01-26 DIAGNOSIS — R293 Abnormal posture: Secondary | ICD-10-CM | POA: Diagnosis not present

## 2019-01-26 DIAGNOSIS — H9190 Unspecified hearing loss, unspecified ear: Secondary | ICD-10-CM | POA: Diagnosis not present

## 2019-01-26 DIAGNOSIS — Z7901 Long term (current) use of anticoagulants: Secondary | ICD-10-CM | POA: Diagnosis not present

## 2019-01-26 DIAGNOSIS — R2681 Unsteadiness on feet: Secondary | ICD-10-CM | POA: Diagnosis not present

## 2019-01-26 DIAGNOSIS — I480 Paroxysmal atrial fibrillation: Secondary | ICD-10-CM | POA: Diagnosis not present

## 2019-01-26 DIAGNOSIS — R296 Repeated falls: Secondary | ICD-10-CM | POA: Diagnosis not present

## 2019-01-26 DIAGNOSIS — Z461 Encounter for fitting and adjustment of hearing aid: Secondary | ICD-10-CM | POA: Diagnosis not present

## 2019-01-29 DIAGNOSIS — R399 Unspecified symptoms and signs involving the genitourinary system: Secondary | ICD-10-CM | POA: Diagnosis not present

## 2019-02-01 DIAGNOSIS — M80052S Age-related osteoporosis with current pathological fracture, left femur, sequela: Secondary | ICD-10-CM | POA: Diagnosis not present

## 2019-02-01 DIAGNOSIS — M19049 Primary osteoarthritis, unspecified hand: Secondary | ICD-10-CM | POA: Diagnosis not present

## 2019-02-01 DIAGNOSIS — I4891 Unspecified atrial fibrillation: Secondary | ICD-10-CM | POA: Diagnosis not present

## 2019-02-01 DIAGNOSIS — G4733 Obstructive sleep apnea (adult) (pediatric): Secondary | ICD-10-CM | POA: Diagnosis not present

## 2019-02-01 DIAGNOSIS — I10 Essential (primary) hypertension: Secondary | ICD-10-CM | POA: Diagnosis not present

## 2019-02-01 DIAGNOSIS — I69354 Hemiplegia and hemiparesis following cerebral infarction affecting left non-dominant side: Secondary | ICD-10-CM | POA: Diagnosis not present

## 2019-02-01 DIAGNOSIS — M9702XS Periprosthetic fracture around internal prosthetic left hip joint, sequela: Secondary | ICD-10-CM | POA: Diagnosis not present

## 2019-02-01 DIAGNOSIS — Z7901 Long term (current) use of anticoagulants: Secondary | ICD-10-CM | POA: Diagnosis not present

## 2019-02-01 DIAGNOSIS — M7592 Shoulder lesion, unspecified, left shoulder: Secondary | ICD-10-CM | POA: Diagnosis not present

## 2019-02-01 DIAGNOSIS — I872 Venous insufficiency (chronic) (peripheral): Secondary | ICD-10-CM | POA: Diagnosis not present

## 2019-02-13 DIAGNOSIS — R399 Unspecified symptoms and signs involving the genitourinary system: Secondary | ICD-10-CM | POA: Diagnosis not present

## 2019-02-15 DIAGNOSIS — Z87440 Personal history of urinary (tract) infections: Secondary | ICD-10-CM | POA: Diagnosis not present

## 2019-02-15 DIAGNOSIS — R300 Dysuria: Secondary | ICD-10-CM | POA: Diagnosis not present

## 2019-02-15 DIAGNOSIS — N3020 Other chronic cystitis without hematuria: Secondary | ICD-10-CM | POA: Diagnosis not present

## 2019-02-26 DIAGNOSIS — R399 Unspecified symptoms and signs involving the genitourinary system: Secondary | ICD-10-CM | POA: Diagnosis not present

## 2019-03-01 DIAGNOSIS — N390 Urinary tract infection, site not specified: Secondary | ICD-10-CM | POA: Diagnosis not present

## 2019-03-01 DIAGNOSIS — Z881 Allergy status to other antibiotic agents status: Secondary | ICD-10-CM | POA: Diagnosis not present

## 2019-03-01 DIAGNOSIS — Z7901 Long term (current) use of anticoagulants: Secondary | ICD-10-CM | POA: Diagnosis not present

## 2019-03-01 DIAGNOSIS — K580 Irritable bowel syndrome with diarrhea: Secondary | ICD-10-CM | POA: Diagnosis not present

## 2019-03-01 DIAGNOSIS — I480 Paroxysmal atrial fibrillation: Secondary | ICD-10-CM | POA: Diagnosis not present

## 2019-03-01 DIAGNOSIS — Z880 Allergy status to penicillin: Secondary | ICD-10-CM | POA: Diagnosis not present

## 2019-03-01 DIAGNOSIS — Z882 Allergy status to sulfonamides status: Secondary | ICD-10-CM | POA: Diagnosis not present

## 2019-03-03 DIAGNOSIS — I872 Venous insufficiency (chronic) (peripheral): Secondary | ICD-10-CM | POA: Diagnosis not present

## 2019-03-03 DIAGNOSIS — Z7901 Long term (current) use of anticoagulants: Secondary | ICD-10-CM | POA: Diagnosis not present

## 2019-03-03 DIAGNOSIS — I10 Essential (primary) hypertension: Secondary | ICD-10-CM | POA: Diagnosis not present

## 2019-03-03 DIAGNOSIS — M19049 Primary osteoarthritis, unspecified hand: Secondary | ICD-10-CM | POA: Diagnosis not present

## 2019-03-03 DIAGNOSIS — M80052S Age-related osteoporosis with current pathological fracture, left femur, sequela: Secondary | ICD-10-CM | POA: Diagnosis not present

## 2019-03-03 DIAGNOSIS — M7592 Shoulder lesion, unspecified, left shoulder: Secondary | ICD-10-CM | POA: Diagnosis not present

## 2019-03-03 DIAGNOSIS — G4733 Obstructive sleep apnea (adult) (pediatric): Secondary | ICD-10-CM | POA: Diagnosis not present

## 2019-03-03 DIAGNOSIS — I69354 Hemiplegia and hemiparesis following cerebral infarction affecting left non-dominant side: Secondary | ICD-10-CM | POA: Diagnosis not present

## 2019-03-03 DIAGNOSIS — I4891 Unspecified atrial fibrillation: Secondary | ICD-10-CM | POA: Diagnosis not present

## 2019-03-03 DIAGNOSIS — M9702XS Periprosthetic fracture around internal prosthetic left hip joint, sequela: Secondary | ICD-10-CM | POA: Diagnosis not present

## 2019-03-07 DIAGNOSIS — M80052S Age-related osteoporosis with current pathological fracture, left femur, sequela: Secondary | ICD-10-CM | POA: Diagnosis not present

## 2019-03-07 DIAGNOSIS — I10 Essential (primary) hypertension: Secondary | ICD-10-CM | POA: Diagnosis not present

## 2019-03-07 DIAGNOSIS — I4891 Unspecified atrial fibrillation: Secondary | ICD-10-CM | POA: Diagnosis not present

## 2019-03-07 DIAGNOSIS — M7592 Shoulder lesion, unspecified, left shoulder: Secondary | ICD-10-CM | POA: Diagnosis not present

## 2019-03-07 DIAGNOSIS — M9702XS Periprosthetic fracture around internal prosthetic left hip joint, sequela: Secondary | ICD-10-CM | POA: Diagnosis not present

## 2019-03-07 DIAGNOSIS — I69354 Hemiplegia and hemiparesis following cerebral infarction affecting left non-dominant side: Secondary | ICD-10-CM | POA: Diagnosis not present

## 2019-05-12 IMAGING — RF DG FEMUR 2+V*L*
1 series · 6 of 6 positions shown · non-contrast
Comparison: CT and plain films left knee 08/06/2017.

CLINICAL DATA: Intraoperative imaging for fixation of a
periprosthetic left knee fracture suffered in a fall 08/06/2017.

EXAM:
DG C-ARM 61-120 MIN; LEFT FEMUR 2 VIEWS

[Series 1: run · 6 of 6 slices shown]
[im 1/6]
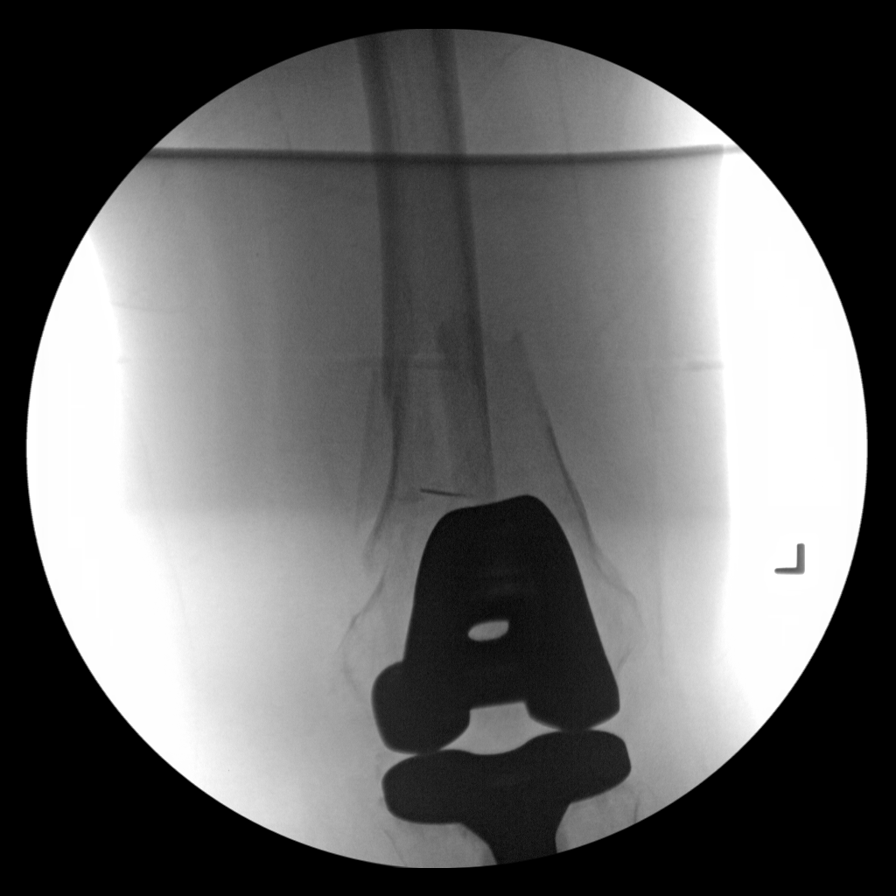
[im 2/6]
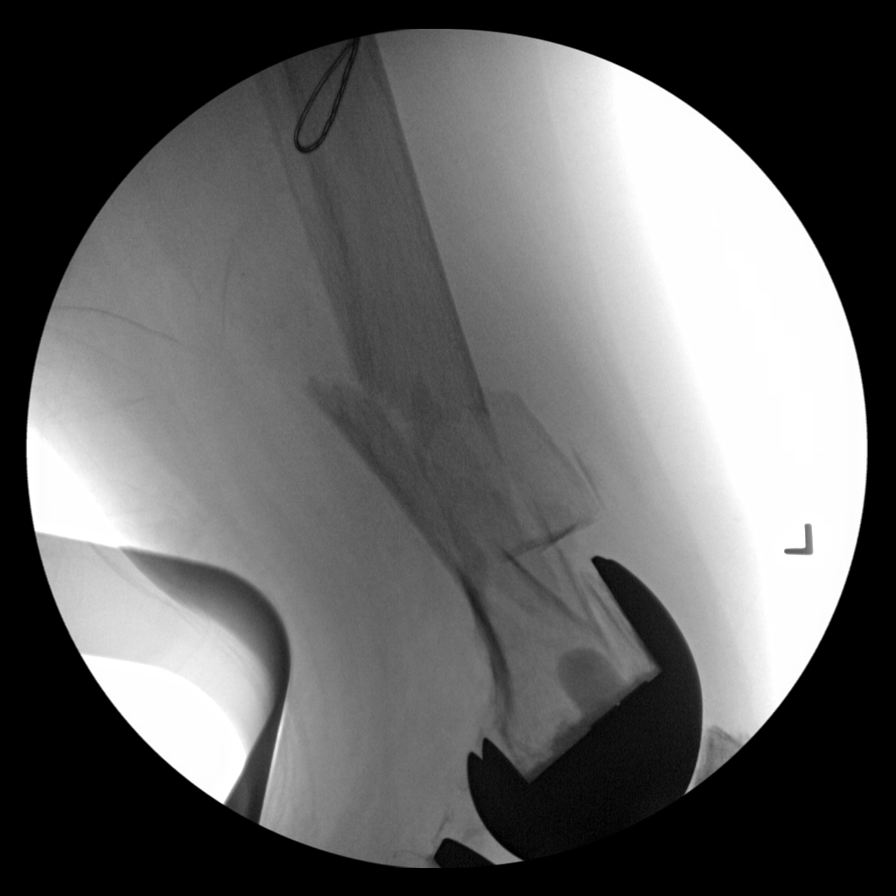
[im 3/6]
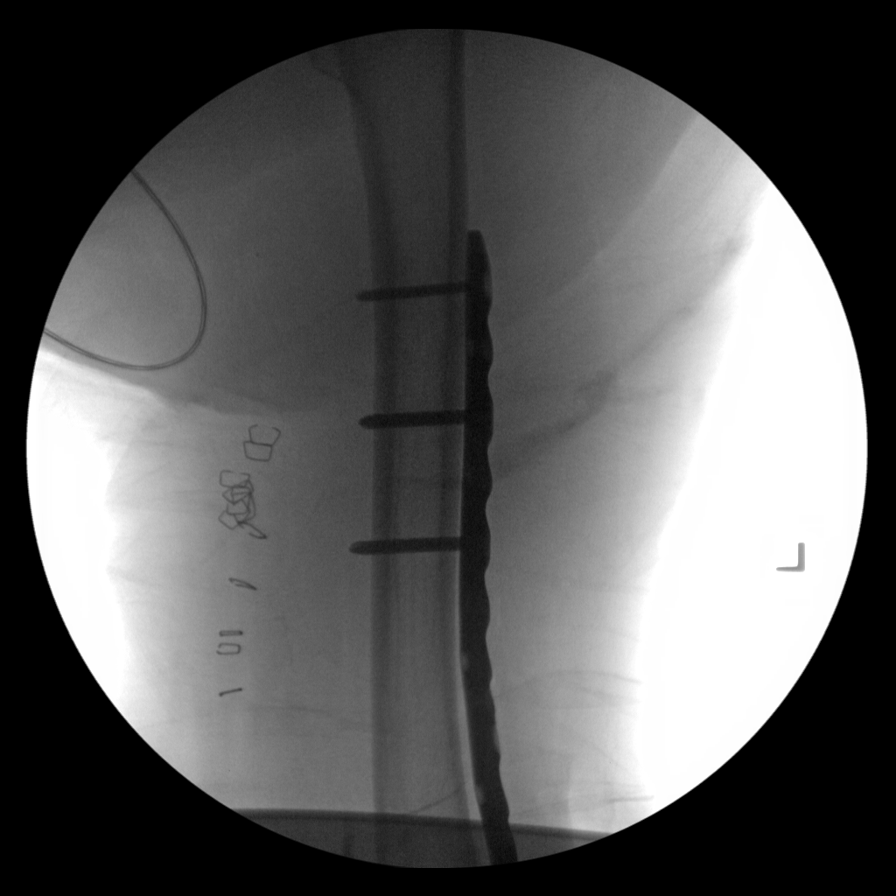
[im 4/6]
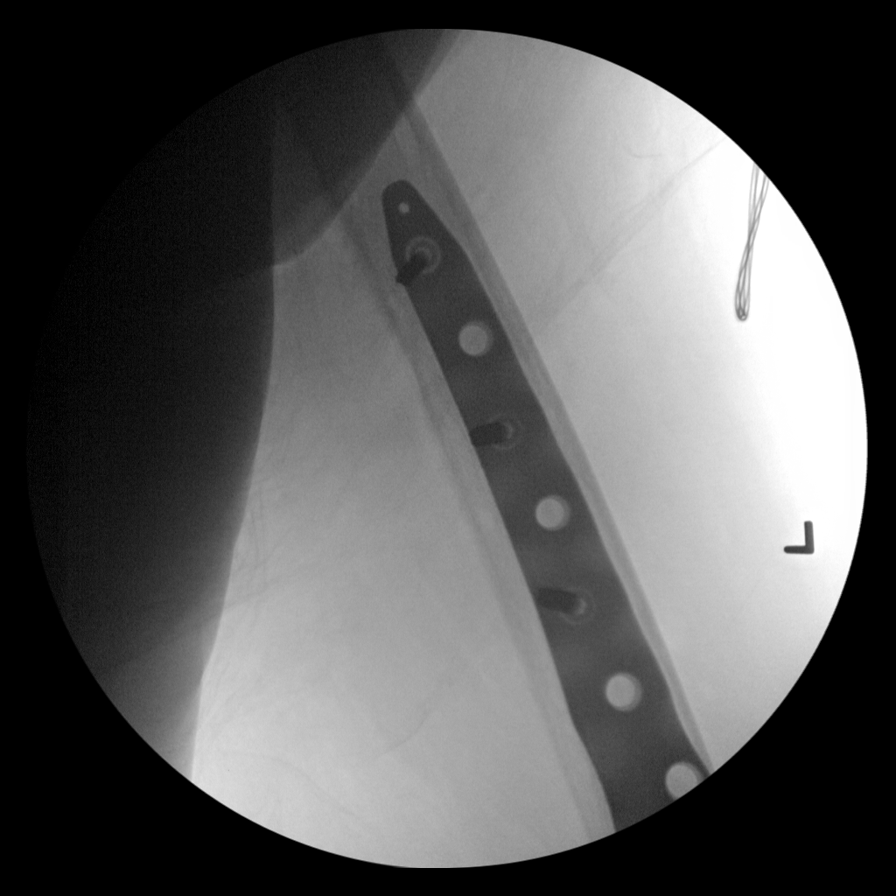
[im 5/6]
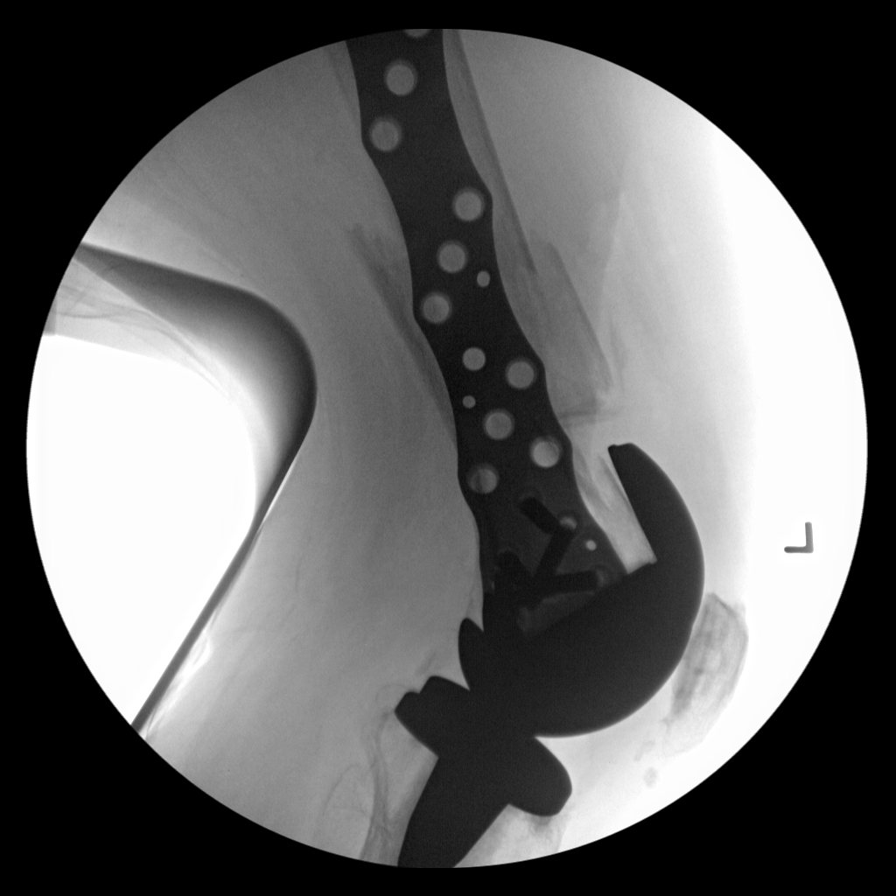
[im 6/6]
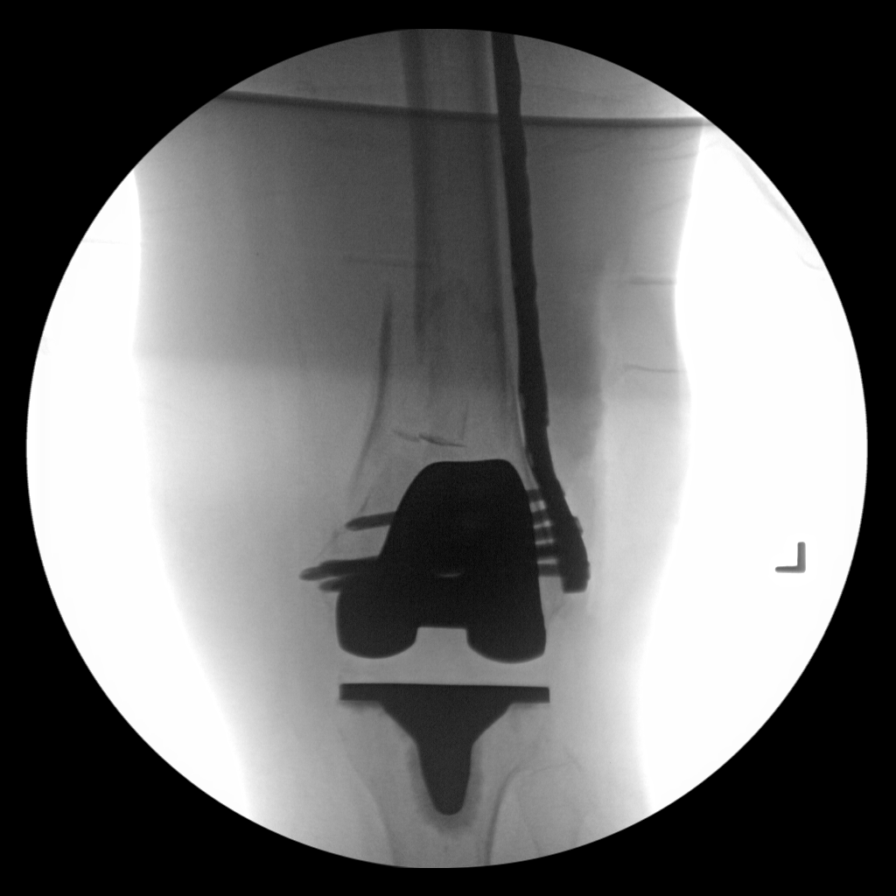

[6 of 6 positions shown; findings below may reference images not displayed]

FINDINGS: Six fluoroscopic intraoperative spot views of the left knee are
provided. Images demonstrate placement lateral plate and screws for
fixation of a periprosthetic distal femur fracture. Position and
alignment are improved. No new abnormality.
IMPRESSION: Intraoperative imaging for fixation of a periprosthetic distal left
femur fracture. No acute finding.

## 2019-05-12 IMAGING — DX DG FEMUR 2+V PORT*L*
4 series · 4 of 4 positions shown · non-contrast
Comparison: Preoperative images August 06, 2017; intraoperative study

CLINICAL DATA: Postoperative fixation for femur fracture

EXAM:
LEFT FEMUR PORTABLE 2 VIEWS

[femur ap (1 of 2)]
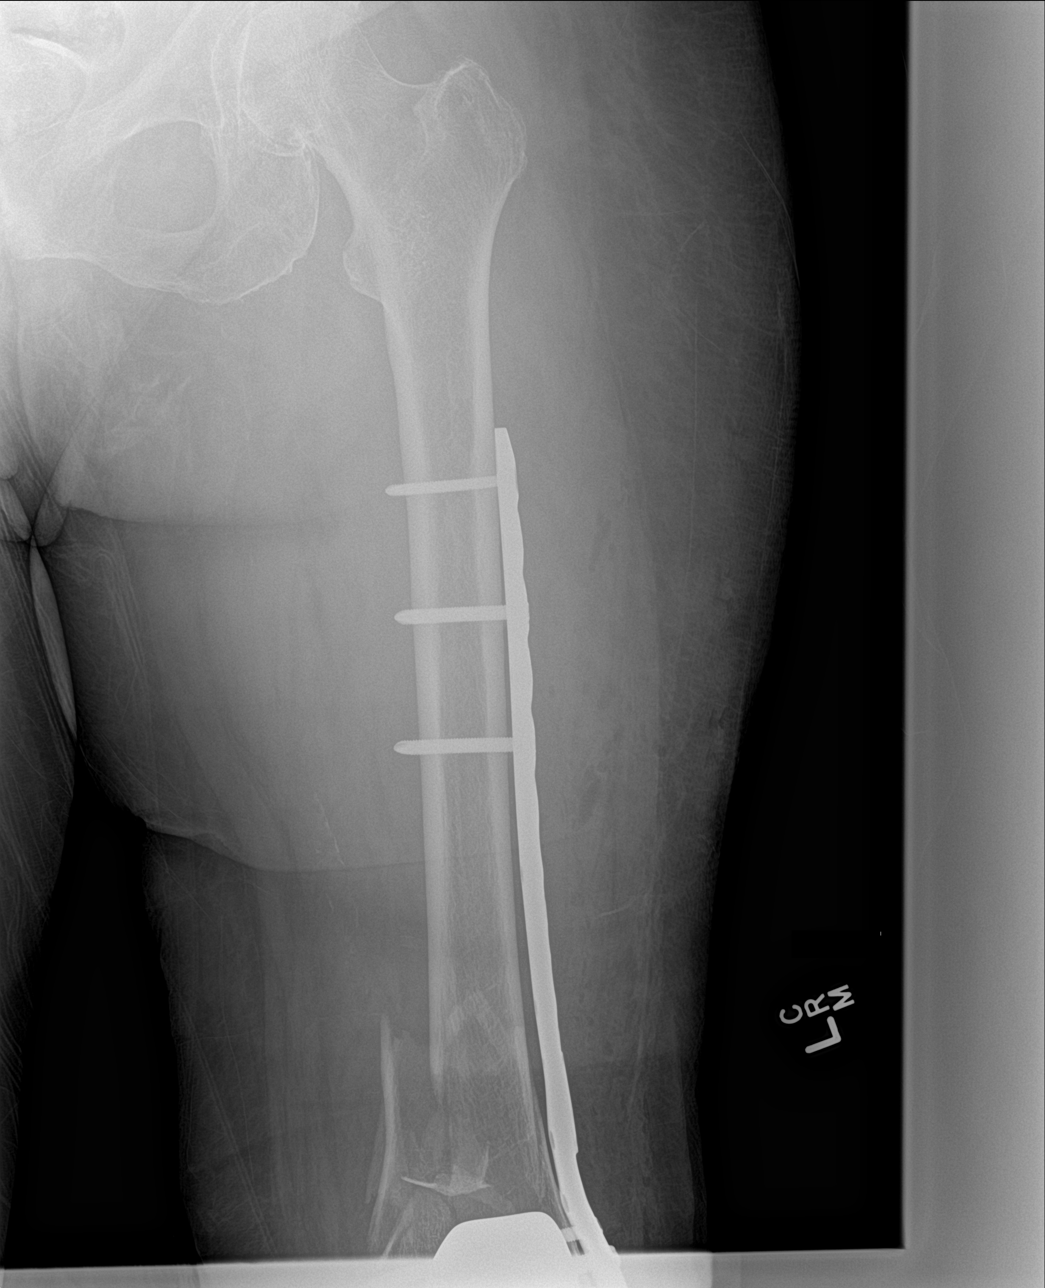

[femur ap (2 of 2)]
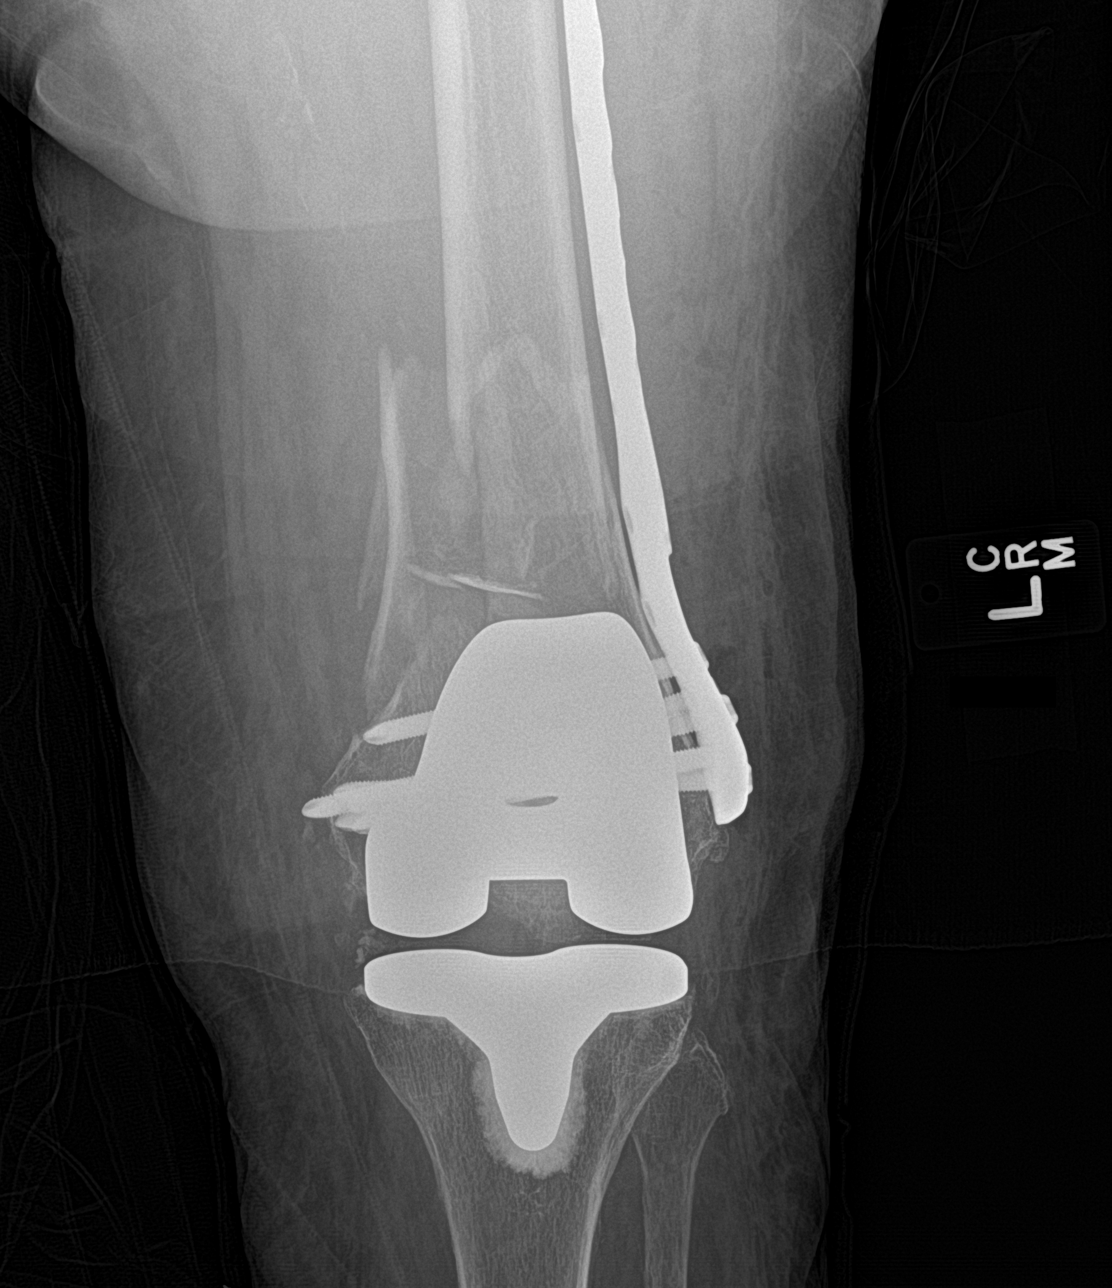

[femur lat (1 of 2)]
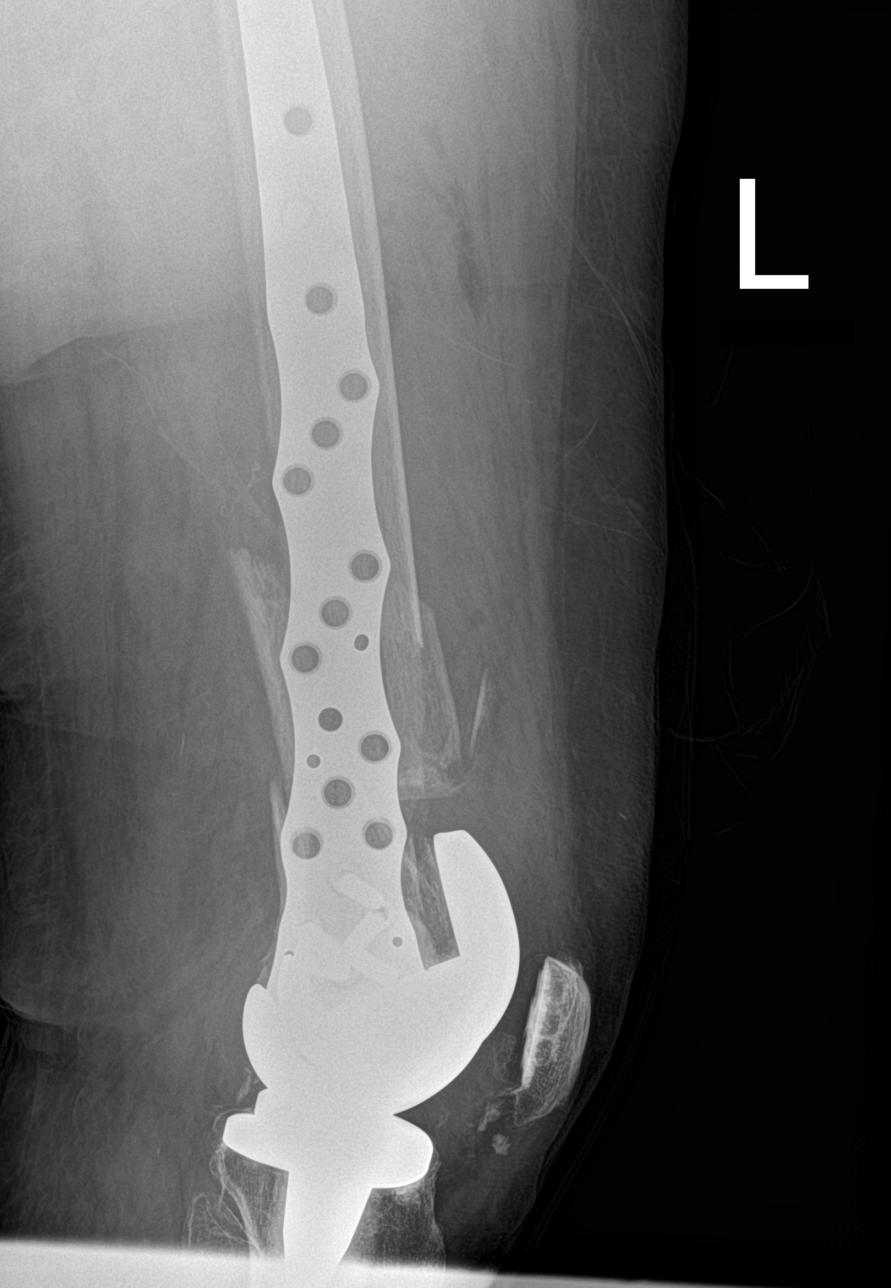

[femur lat (2 of 2)]
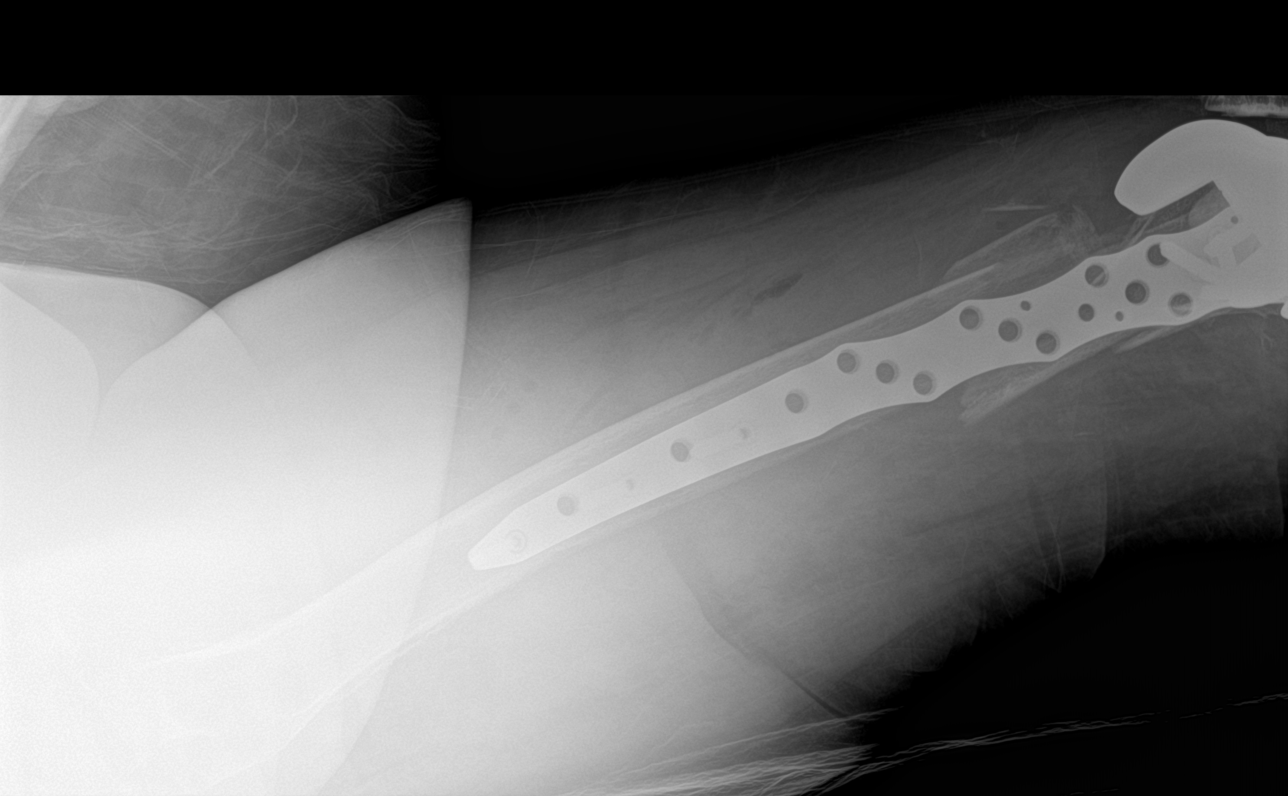

[4 of 4 positions shown; findings below may reference images not displayed]

FINDINGS: Frontal and lateral views were obtained. There is screw and plate
fixation through a comminuted fracture of the distal femoral
diaphysis. There remains moderate displacement of fracture fragments
slightly proximal to the distal screws with lateral and posterior
displacement of the distal major fracture fragment with respect to
the proximal major fragment. Screw and plate fixation traverses this
area with alignment essentially anatomic between the proximal and
distal aspects of the femur. There is a total knee replacement with
prosthetic components well-seated. No dislocation.
IMPRESSION: Surgical fixation through a comminuted fracture of the distal femur
with overall alignment near anatomic, although there is displacement
of fracture fragments slightly proximal to the distal screws. Total
knee replacement with prosthetic components well-seated. No new
fracture. No dislocation.

## 2019-05-13 IMAGING — CR DG SHOULDER 2+V*L*
2 series · 2 of 2 positions shown · non-contrast
Comparison: None.

CLINICAL DATA: Pt c/o left shoulder pain, pt states she fell at
home on her tile floor on [REDACTED] and landed on her left side.

EXAM:
LEFT SHOULDER - 2+ VIEW

[shoulder grashey]
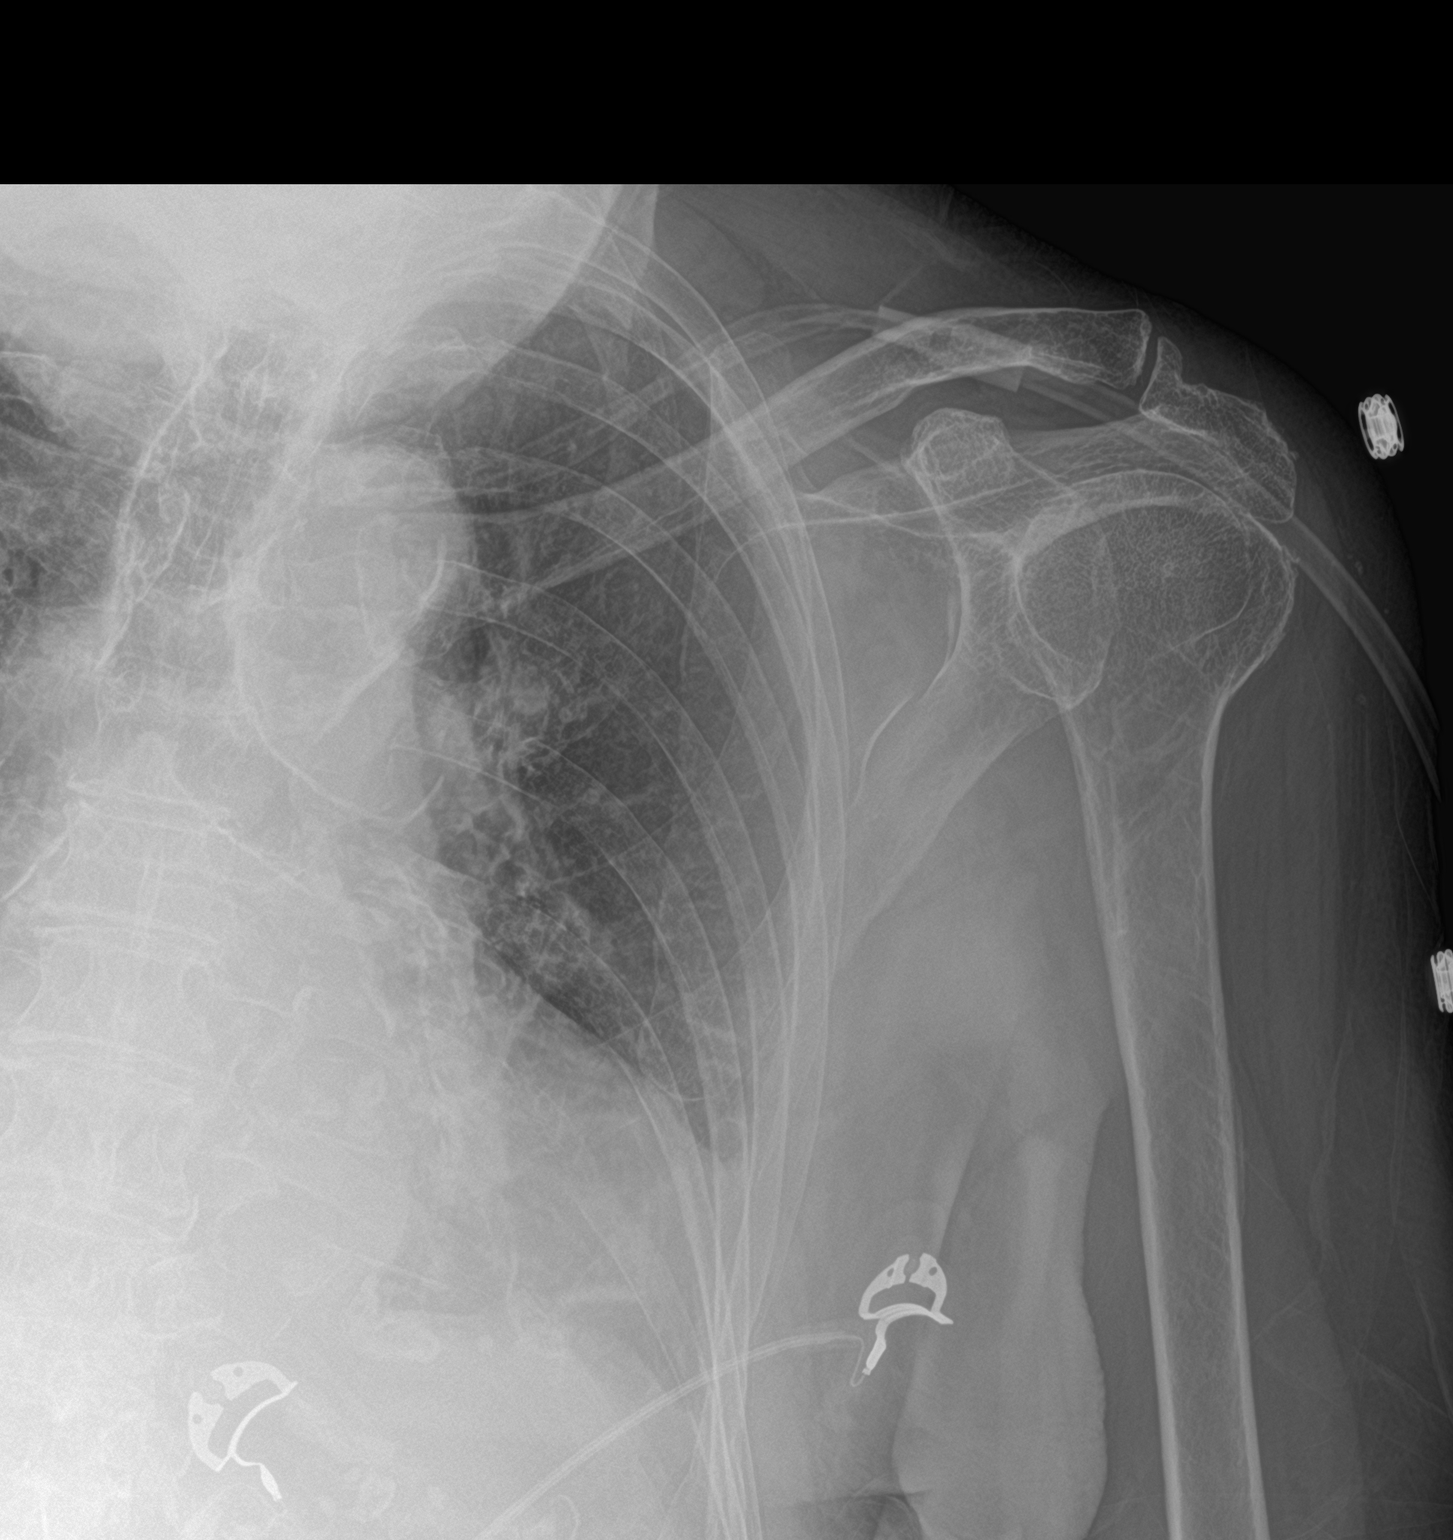

[shoulder y view]
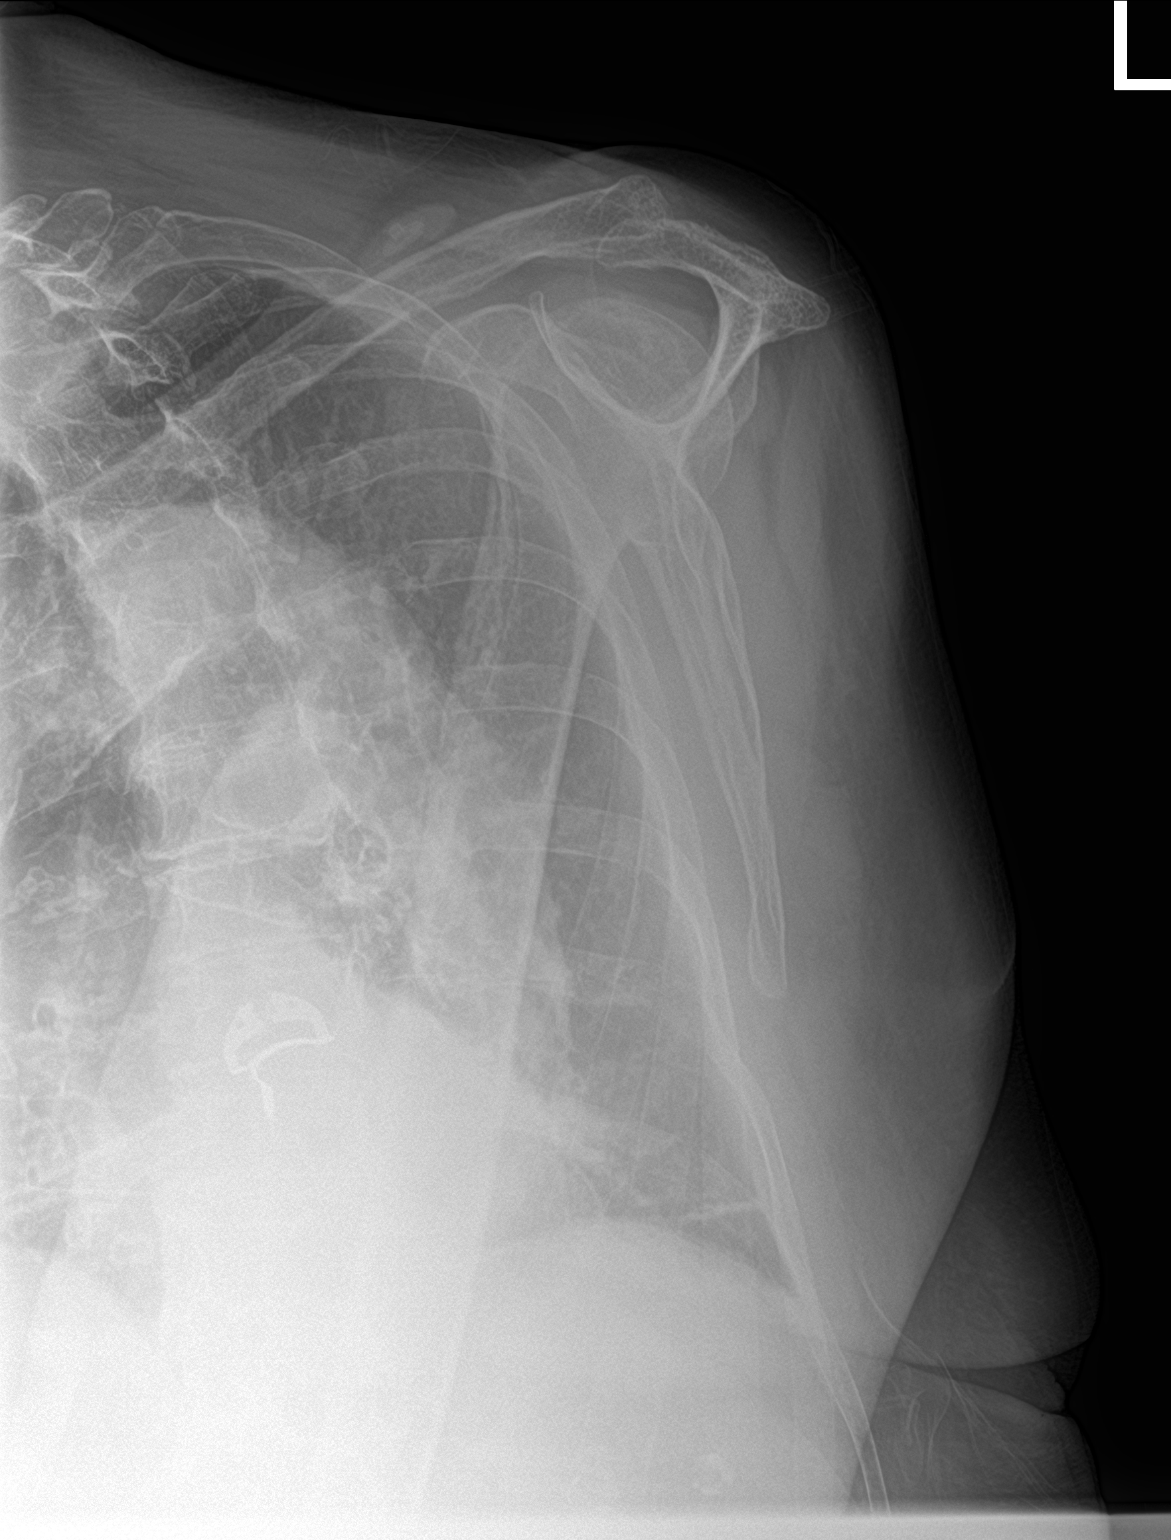

[2 of 2 positions shown; findings below may reference images not displayed]

FINDINGS: Diffuse osteopenia limits characterization of osseous detail,
however, there is no fracture line or displaced fracture fragment
seen. LEFT humeral head appears normally positioned relative to the
glenoid fossa. Overlying acromioclavicular joint space is normally
aligned. Soft tissues about the LEFT shoulder are unremarkable.
IMPRESSION: 1. No acute findings.
2. Osteopenia.

## 2019-08-15 ENCOUNTER — Ambulatory Visit (INDEPENDENT_AMBULATORY_CARE_PROVIDER_SITE_OTHER): Payer: Medicare HMO | Admitting: Podiatry

## 2019-08-15 ENCOUNTER — Other Ambulatory Visit: Payer: Self-pay

## 2019-08-15 ENCOUNTER — Encounter: Payer: Self-pay | Admitting: Podiatry

## 2019-08-15 DIAGNOSIS — M79674 Pain in right toe(s): Secondary | ICD-10-CM | POA: Diagnosis not present

## 2019-08-15 DIAGNOSIS — B351 Tinea unguium: Secondary | ICD-10-CM

## 2019-08-15 DIAGNOSIS — M79675 Pain in left toe(s): Secondary | ICD-10-CM

## 2019-08-15 NOTE — Progress Notes (Signed)

## 2019-08-15 NOTE — Progress Notes (Signed)
Office Visit Note  Patient: [name]             Date of Birth: 08-01-1933           MRN: [id number]             PCP: Leanna Battles, MD Referring: Leanna Battles, MD Visit Date: 08/23/2019 Occupation: @GUAROCC @  Subjective:  Other (bilateral thumb pain )   History of Present Illness: [name] is a 86 y.o. female seen in consultation per request of her PCP for bilateral hand pain.  She was accompanied by her husband today.  According to her she has been having pain and discomfort in her bilateral hands which she describes over her thumb.  She states she has lymphedema in her left arm.  She bandages her left arm every night which is becoming more more difficult due to pain in her thumb.  She has difficulty buttoning her shirt and gripping objects.  She also has some left knee joint discomfort which has been replaced in the past due to femur fracture.  She has fluid retention in her bilateral lower extremities which causes discomfort.  None of the other joints are painful.  Activities of Daily Living:  Patient reports morning stiffness for 30 minutes.   Patient Denies nocturnal pain.  Difficulty dressing/grooming: Reports Difficulty climbing stairs: Reports Difficulty getting out of chair: Reports Difficulty using hands for taps, buttons, cutlery, and/or writing: Reports  Review of Systems  Constitutional: Positive for fatigue. Negative for night sweats, weight gain and weight loss.  HENT: Positive for mouth dryness. Negative for mouth sores, trouble swallowing, trouble swallowing and nose dryness.   Eyes: Positive for dryness. Negative for pain, redness, itching and visual disturbance.  Respiratory: Negative for cough, shortness of breath and difficulty breathing.   Cardiovascular: Negative for chest pain, palpitations, hypertension, irregular heartbeat and swelling in legs/feet.  Gastrointestinal: Positive for diarrhea. Negative for blood in stool and constipation.    Endocrine: Negative for increased urination.  Genitourinary: Negative for difficulty urinating and vaginal dryness.  Musculoskeletal: Positive for arthralgias, gait problem, joint pain and morning stiffness. Negative for joint swelling, myalgias, muscle weakness, muscle tenderness and myalgias.  Skin: Positive for color change. Negative for rash, hair loss, skin tightness, ulcers and sensitivity to sunlight.  Allergic/Immunologic: Negative for susceptible to infections.  Neurological: Positive for weakness. Negative for dizziness, headaches, memory loss and night sweats.  Hematological: Positive for bruising/bleeding tendency. Negative for swollen glands.  Psychiatric/Behavioral: Negative for depressed mood, confusion and sleep disturbance. The patient is not nervous/anxious.     PMFS History:  Patient Active Problem List   Diagnosis Date Noted  . Pain due to onychomycosis of toenails of both feet 08/15/2019  . Lightheadedness 08/06/2017  . Femur fracture, left ([place]) 08/06/2017  . Dehydration 08/06/2017  . Acute lower UTI 08/06/2017  . Periprosthetic fracture of shaft of femur   . Anemia 11/10/2012    Class: Chronic  . Syncope 11/09/2012  . Urinary tract infection, recurrent 11/09/2012  . Hyponatremia 11/09/2012  . Diarrhea 11/09/2012  . Paroxysmal atrial fibrillation ([place]) 11/09/2012  . Hypertension 11/09/2012  . Synovitis of knee 04/07/2012  . Postop Hypokalemia 09/23/2011  . Postop Acute blood loss anemia 09/21/2011  . Postop Transfusion 09/21/2011  . OA (osteoarthritis) of knee 09/20/2011    Past Medical History:  Diagnosis Date  . Arthritis    "fingers, hips, feet, ankles" (11/10/2012)  . Asthma   . Atrial fibrillation (HCC)    CHRONIC COUMADIN  .  Breast cancer ([place]) 1990's   "cancer on one side; precancerous tissue on the other" (11/10/2012)  . Chronic bronchitis ([place])    "multiple times; not in a long time" (11/10/2012  . Depression   . GERD (gastroesophageal reflux  disease)   . Hearing impaired   . Hypertension   . Irritable bowel   . Lymphedema    HX OF - IN LEFT ARM--NO NEEDLES OR B/P'S LEFT ARM  . Macular degeneration    "BEGINNINGS OF" MACULAR DEGENERATION  . Osteoporosis   . Pneumonia    "multiple times; not in a long time" (11/10/2012)  . Recurrent UTI   . Sleep apnea    "dx'd w/it; don't wear mask or anything" (11/10/2012)  . Stroke ([place]) ~ 2005   HX OF TIA-NO RESIDUAL PROBLEM--PARALYSIS RT SIDE FACE /PT'S MOUTH DROOPS-AND LOSS OF HEARING RT EAR --SINCE EAR SURGERY AS A CHILD   . UTI (lower urinary tract infection)    FREQUENT    Family History  Problem Relation Age of Onset  . CAD Mother   . Stroke Mother   . CAD Father    Past Surgical History:  Procedure Laterality Date  . BREAST BIOPSY Bilateral 1990's  . Dacryocystorhinostomy  02/18/2016  . INNER EAR SURGERY Right    MULTIPLE EAR SURGERIES,  . JOINT REPLACEMENT    . KNEE ARTHROSCOPY  04/07/2012   Procedure: ARTHROSCOPY KNEE;  Surgeon: Gearlean Alf, MD;  Location: [facility];  Service: Orthopedics;  Laterality: Left;  WITH SYNOVECTOMY  . MASTECTOMY Bilateral 1990's  . MASTOIDECTOMY Right 1942  . ORIF FEMUR FRACTURE Left 08/08/2017   Procedure: OPEN REDUCTION INTERNAL FIXATION (ORIF) DISTAL FEMUR FRACTURE;  Surgeon: Shona Needles, MD;  Location: [facility];  Service: Orthopedics;  Laterality: Left;  . TONSILLECTOMY    . TOTAL KNEE ARTHROPLASTY  09/20/2011   LEFT TOTAL KNEE ARTHROPLASTY;  Surgeon: Gearlean Alf, MD;  Location[facility];  Service: Orthopedics;  Laterality: Left;  . TOTAL KNEE ARTHROPLASTY  2006   Social History   Social History Narrative  . Not on file    There is no immunization history on file for this patient.   Objective: Vital Signs: BP (!) 151/61 (BP Location: Right Arm, Patient Position: Sitting, Cuff Size: Normal)   Pulse (!) 52   Resp 17   Ht 5\' 4"  (1.626 m)   Wt 174 lb 9.6 oz (79.2 kg)   BMI 29.97 kg/m    Physical  Exam Vitals and nursing note reviewed.  Constitutional:      Appearance: She is well-developed.  HENT:     Head: Normocephalic and atraumatic.  Eyes:     Conjunctiva/sclera: Conjunctivae normal.  Cardiovascular:     Rate and Rhythm: Normal rate and regular rhythm.     Heart sounds: Normal heart sounds.  Pulmonary:     Effort: Pulmonary effort is normal.     Breath sounds: Normal breath sounds.  Abdominal:     General: Bowel sounds are normal.     Palpations: Abdomen is soft.  Musculoskeletal:     Cervical back: Normal range of motion.     Right lower leg: Edema present.     Left lower leg: Edema present.  Lymphadenopathy:     Cervical: No cervical adenopathy.  Skin:    General: Skin is warm and dry.     Capillary Refill: Capillary refill takes less than 2 seconds.  Neurological:     Mental Status: She is alert  and oriented to person, place, and time.  Psychiatric:        Behavior: Behavior normal.      Musculoskeletal Exam: C-spine was in good range of motion.  Thoracic and lumbar spine were difficult to assess as she uses a walker and has difficulty with mobility.  Shoulder joints, elbow joints are in good range of motion.  She has no synovitis of her wrist joints.  She has bilateral CMC thickening.  She has PIP and DIP thickening.  No synovitis was noted.  Left knee joint is replaced.  She had bilateral pedal edema.  CDAI Exam: CDAI Score: -- Patient Global: --; Provider Global: -- Swollen: --; Tender: -- Joint Exam 08/23/2019   No joint exam has been documented for this visit   There is currently no information documented on the homunculus. Go to the Rheumatology activity and complete the homunculus joint exam.  Investigation: No additional findings.  Imaging: No results found.  Recent Labs: Lab Results  Component Value Date   WBC 9.5 08/11/2017   HGB 8.4 (L) 08/11/2017   PLT 232 08/11/2017   NA 138 08/11/2017   K 3.7 08/11/2017   CL 103 08/11/2017   CO2  26 08/11/2017   GLUCOSE 139 (H) 08/11/2017   BUN 23 (H) 08/11/2017   CREATININE 1.08 (H) 08/11/2017   BILITOT 0.4 08/06/2017   ALKPHOS 72 08/06/2017   AST 20 08/06/2017   ALT 17 08/06/2017   PROT 6.6 08/06/2017   ALBUMIN 3.1 (L) 08/07/2017   CALCIUM 7.9 (L) 08/11/2017   GFRAA 54 (L) 08/11/2017    Speciality Comments: No specialty comments available.  Procedures:  No procedures performed Allergies: Sulfamethoxazole, Ampicillin, Clindamycin/lincomycin, Latex, Other, and Pradaxa [dabigatran etexilate mesylate]   Assessment / Plan:     Visit Diagnoses: Pain in both hands-patient has pain and discomfort over bilateral CMC joints.  She has bilateral CMC thickening and tenderness.  I offered x-ray of bilateral hands which she declined.  I also offered cortisone injection which she declined.  She can also use anti-inflammatories as she is on anticoagulants.  Joint protection muscle strengthening was discussed.  Have given her a prescription for bilateral CMC braces which will be helpful.  She will get it from the medical supplies.  Pain in right hip-since her hip fracture she has some chronic discomfort.  Periprosthetic fracture of shaft of femur  History of osteopenia-she is on calcium and vitamin D.  Chronic acquired lymphedema-left upper extremity.  Chronic venous insufficiency-she has bilateral significant pedal edema.  Essential hypertension  Paroxysmal atrial fibrillation (HCC)  History of TIA (transient ischemic attack)  History of IBS  History of depression  History of breast cancer  OSA (obstructive sleep apnea)  Orders: No orders of the defined types were placed in this encounter.  No orders of the defined types were placed in this encounter.    Follow-Up Instructions: Return if symptoms worsen or fail to improve, for Osteoarthritis.   Bo Merino, MD  Note - This record has been created using Editor, commissioning.  Chart creation errors have been sought,  but may not always  have been located. Such creation errors do not reflect on  the standard of medical care.

## 2019-08-23 ENCOUNTER — Encounter: Payer: Self-pay | Admitting: Rheumatology

## 2019-08-23 ENCOUNTER — Ambulatory Visit (INDEPENDENT_AMBULATORY_CARE_PROVIDER_SITE_OTHER): Payer: Medicare HMO | Admitting: Rheumatology

## 2019-08-23 ENCOUNTER — Other Ambulatory Visit: Payer: Self-pay

## 2019-08-23 VITALS — BP 151/61 | HR 52 | Resp 17 | Ht 64.0 in | Wt 174.6 lb

## 2019-08-23 DIAGNOSIS — Z8673 Personal history of transient ischemic attack (TIA), and cerebral infarction without residual deficits: Secondary | ICD-10-CM

## 2019-08-23 DIAGNOSIS — Z853 Personal history of malignant neoplasm of breast: Secondary | ICD-10-CM

## 2019-08-23 DIAGNOSIS — I10 Essential (primary) hypertension: Secondary | ICD-10-CM

## 2019-08-23 DIAGNOSIS — M978XXA Periprosthetic fracture around other internal prosthetic joint, initial encounter: Secondary | ICD-10-CM

## 2019-08-23 DIAGNOSIS — M79641 Pain in right hand: Secondary | ICD-10-CM | POA: Diagnosis not present

## 2019-08-23 DIAGNOSIS — Z8659 Personal history of other mental and behavioral disorders: Secondary | ICD-10-CM

## 2019-08-23 DIAGNOSIS — I480 Paroxysmal atrial fibrillation: Secondary | ICD-10-CM

## 2019-08-23 DIAGNOSIS — I890 Lymphedema, not elsewhere classified: Secondary | ICD-10-CM

## 2019-08-23 DIAGNOSIS — M25551 Pain in right hip: Secondary | ICD-10-CM | POA: Diagnosis not present

## 2019-08-23 DIAGNOSIS — M79642 Pain in left hand: Secondary | ICD-10-CM

## 2019-08-23 DIAGNOSIS — I872 Venous insufficiency (chronic) (peripheral): Secondary | ICD-10-CM

## 2019-08-23 DIAGNOSIS — Z8739 Personal history of other diseases of the musculoskeletal system and connective tissue: Secondary | ICD-10-CM

## 2019-08-23 DIAGNOSIS — Z8719 Personal history of other diseases of the digestive system: Secondary | ICD-10-CM

## 2019-08-23 DIAGNOSIS — G4733 Obstructive sleep apnea (adult) (pediatric): Secondary | ICD-10-CM

## 2019-08-23 DIAGNOSIS — Z96649 Presence of unspecified artificial hip joint: Secondary | ICD-10-CM

## 2019-08-30 ENCOUNTER — Ambulatory Visit (INDEPENDENT_AMBULATORY_CARE_PROVIDER_SITE_OTHER): Payer: Medicare Other | Admitting: Cardiology

## 2019-08-30 ENCOUNTER — Other Ambulatory Visit: Payer: Self-pay

## 2019-08-30 ENCOUNTER — Encounter: Payer: Self-pay | Admitting: Cardiology

## 2019-08-30 VITALS — BP 140/60 | HR 59 | Ht 64.0 in | Wt 174.0 lb

## 2019-08-30 DIAGNOSIS — R001 Bradycardia, unspecified: Secondary | ICD-10-CM

## 2019-08-30 DIAGNOSIS — I480 Paroxysmal atrial fibrillation: Secondary | ICD-10-CM

## 2019-08-30 DIAGNOSIS — N1831 Chronic kidney disease, stage 3a: Secondary | ICD-10-CM

## 2019-08-30 NOTE — Patient Instructions (Signed)
Medication Instructions:  The current medical regimen is effective;  continue present plan and medications.  *If you need a refill on your cardiac medications before your next appointment, please call your pharmacy*  Follow-Up: At [facility], you and your health needs are our priority.  As part of our continuing mission to provide you with exceptional heart care, we have created designated Provider Care Teams.  These Care Teams include your primary Cardiologist (physician) and Advanced Practice Providers (APPs -  Physician Assistants and Nurse Practitioners) who all work together to provide you with the care you need, when you need it.  We recommend signing up for the patient portal called "MyChart".  Sign up information is provided on this After Visit Summary.  MyChart is used to connect with patients for Virtual Visits (Telemedicine).  Patients are able to view lab/test results, encounter notes, upcoming appointments, etc.  Non-urgent messages can be sent to your provider as well.   To learn more about what you can do with MyChart, go to [email or web address].    Your next appointment:   12 month(s)  The format for your next appointment:   In Person  Provider:   Mark Skains, MD   Thank you for choosing [facility]!!      

## 2019-08-30 NOTE — Progress Notes (Signed)
Cardiology Office Note:    Date:  08/30/2019   ID:  [name], DOB 1934-01-03, MRN [id number]  PCP:  Leanna Battles, MD  [facility] Cardiologist:  Candee Furbish, MD  [facility] Electrophysiologist:  None   Referring MD: Leanna Battles, MD     History of Present Illness:    [name] is a 86 y.o. female here for the follow-up of paroxysmal atrial fibrillation, chronic anticoagulation former TIA former patient of Dr. Wynonia Lawman.  Takes Coumadin.  Decrease Toprol in the past because of bradycardia.  Uses walker.  Recently saw dermatologist about lower extremity swelling, reddish discoloration.  Overall, she seems to be doing well without any chest discomfort no fevers no chills no nausea no vomiting no bleeding    Past Medical History:  Diagnosis Date  . Arthritis    "fingers, hips, feet, ankles" (11/10/2012)  . Asthma   . Atrial fibrillation (HCC)    CHRONIC COUMADIN  . Breast cancer ([place]) 1990's   "cancer on one side; precancerous tissue on the other" (11/10/2012)  . Chronic bronchitis ([place])    "multiple times; not in a long time" (11/10/2012  . Depression   . GERD (gastroesophageal reflux disease)   . Hearing impaired   . Hypertension   . Irritable bowel   . Lymphedema    HX OF - IN LEFT ARM--NO NEEDLES OR B/P'S LEFT ARM  . Macular degeneration    "BEGINNINGS OF" MACULAR DEGENERATION  . Osteoporosis   . Pneumonia    "multiple times; not in a long time" (11/10/2012)  . Recurrent UTI   . Sleep apnea    "dx'd w/it; don't wear mask or anything" (11/10/2012)  . Stroke ([place]) ~ 2005   HX OF TIA-NO RESIDUAL PROBLEM--PARALYSIS RT SIDE FACE /PT'S MOUTH DROOPS-AND LOSS OF HEARING RT EAR --SINCE EAR SURGERY AS A CHILD   . UTI (lower urinary tract infection)    FREQUENT    Past Surgical History:  Procedure Laterality Date  . BREAST BIOPSY Bilateral 1990's  . Dacryocystorhinostomy  02/18/2016  . INNER EAR SURGERY Right    MULTIPLE EAR SURGERIES,  . JOINT  REPLACEMENT    . KNEE ARTHROSCOPY  04/07/2012   Procedure: ARTHROSCOPY KNEE;  Surgeon: Gearlean Alf, MD;  Location: [facility];  Service: Orthopedics;  Laterality: Left;  WITH SYNOVECTOMY  . MASTECTOMY Bilateral 1990's  . MASTOIDECTOMY Right 1942  . ORIF FEMUR FRACTURE Left 08/08/2017   Procedure: OPEN REDUCTION INTERNAL FIXATION (ORIF) DISTAL FEMUR FRACTURE;  Surgeon: Shona Needles, MD;  Location: [facility];  Service: Orthopedics;  Laterality: Left;  . TONSILLECTOMY    . TOTAL KNEE ARTHROPLASTY  09/20/2011   LEFT TOTAL KNEE ARTHROPLASTY;  Surgeon: Gearlean Alf, MD;  [facility];  Service: Orthopedics;  Laterality: Left;  . TOTAL KNEE ARTHROPLASTY  2006    Current Medications: Current Meds  Medication Sig  . atorvastatin (LIPITOR) 20 MG tablet Take 20 mg by mouth daily after supper.   . chlorthalidone (HYGROTON) 50 MG tablet Take 50 mg by mouth daily.   Marland Kitchen estradiol (ESTRACE) 0.1 MG/GM vaginal cream Place 1 Applicatorful vaginally See admin instructions. Apply vaginally twice weekly  . loperamide (IMODIUM) 2 MG capsule Take 2-4 mg by mouth 5 (five) times daily as needed for diarrhea or loose stools.   Marland Kitchen losartan (COZAAR) 100 MG tablet Take 100 mg by mouth daily.  . metoprolol succinate (TOPROL-XL) 25 MG 24 hr tablet Take 1 tablet (25 mg total) by mouth  daily.  . Multiple Vitamin (MULTIVITAMIN WITH MINERALS) TABS tablet Take 1 tablet by mouth daily.  . Multiple Vitamins-Minerals (PRESERVISION/LUTEIN) CAPS Take 1 capsule by mouth daily.   . pantoprazole (PROTONIX) 40 MG tablet Take 80 mg by mouth daily.   Vladimir Faster Glycol-Propyl Glycol (SYSTANE) 0.4-0.3 % GEL ophthalmic gel Place 1 application into both eyes See admin instructions. Apply to both eyes every other night at bedtime (alternate with drops)  . Probiotic Product (VSL#3 PO) Take 1 capsule by mouth daily.   Marland Kitchen saccharomyces boulardii (FLORASTOR) 250 MG capsule Take 250-500 mg by mouth See admin instructions.  Take one tablet (250 mg) by mouth every morning and two tablets (500 mg) at night  . warfarin (COUMADIN) 5 MG tablet Take 5 mg by mouth daily with supper. 5mg  by mouth daily, except 7.5mg  on Saturdays     Allergies:   Sulfamethoxazole, Ampicillin, Clindamycin/lincomycin, Latex, Other, and Pradaxa [dabigatran etexilate mesylate]   Social History   Socioeconomic History  . Marital status: Married    Spouse name: Not on file  . Number of children: Not on file  . Years of education: Not on file  . Highest education level: Not on file  Occupational History  . Not on file  Tobacco Use  . Smoking status: Never Smoker  . Smokeless tobacco: Never Used  Substance and Sexual Activity  . Alcohol use: No  . Drug use: No  . Sexual activity: Not on file  Other Topics Concern  . Not on file  Social History Narrative  . Not on file   Social Determinants of Health   Financial Resource Strain:   . Difficulty of Paying Living Expenses:   Food Insecurity:   . Worried About Charity fundraiser in the Last Year:   . Arboriculturist in the Last Year:   Transportation Needs:   . Film/video editor (Medical):   Marland Kitchen Lack of Transportation (Non-Medical):   Physical Activity:   . Days of Exercise per Week:   . Minutes of Exercise per Session:   Stress:   . Feeling of Stress :   Social Connections:   . Frequency of Communication with Friends and Family:   . Frequency of Social Gatherings with Friends and Family:   . Attends Religious Services:   . Active Member of Clubs or Organizations:   . Attends Archivist Meetings:   Marland Kitchen Marital Status:      Family History: The patient's family history includes CAD in her father and mother; Stroke in her mother.  ROS:   Please see the history of present illness.     All other systems reviewed and are negative.  EKGs/Labs/Other Studies Reviewed:    The following studies were reviewed today:  Prior Holter monitor 2014, echocardiogram  2012-normal EF  EKG:  EKG is  ordered today.  The ekg ordered today demonstrates sinus rhythm 59 no other abnormalities-prior normal sinus rhythm, nonspecific ST-T wave changes  Recent Labs: No results found for requested labs within last 8760 hours.  Recent Lipid Panel No results found for: CHOL, TRIG, HDL, CHOLHDL, VLDL, LDLCALC, LDLDIRECT  Physical Exam:    VS:  BP 140/60   Pulse (!) 59   Ht 5\' 4"  (1.626 m)   Wt 174 lb (78.9 kg)   SpO2 98%   BMI 29.87 kg/m     Wt Readings from Last 3 Encounters:  08/30/19 174 lb (78.9 kg)  08/23/19 174 lb 9.6 oz (79.2 kg)  08/28/18 156 lb (70.8 kg)     GEN:  Well nourished, well developed in no acute distress, Walker HEENT: Normal NECK: No JVD; No carotid bruits LYMPHATICS: No lymphadenopathy CARDIAC: RRR, no murmurs, rubs, gallops RESPIRATORY:  Clear to auscultation without rales, wheezing or rhonchi  ABDOMEN: Soft, non-tender, non-distended MUSCULOSKELETAL:  2+ LE edema; No deformity  SKIN: Warm and dry NEUROLOGIC:  Alert and oriented x 3 PSYCHIATRIC:  Normal affect   ASSESSMENT:    1. PAF (paroxysmal atrial fibrillation) (HCC)   2. Stage 3a chronic kidney disease   3. Bradycardia    PLAN:    In order of problems listed above:  Paroxysmal atrial fibrillation -Back in 2018, was noted to be in sinus rhythm.  Continues to do well. -Takes warfarin followed by Dr. Sharlett Iles.  In the past we had discussed the possibility of transitioning over to Eliquis however she wished to be on warfarin, likes being checked frequently.  Bradycardia -Previous visit decreased Toprol-XL 25 mg.  At a prior nurse visit, her heart rate was 48 bpm.  She is 61 today.  Doing well.  Chronic kidney disease stage IIIa -Baseline creatinine is about 1.5.  Last creatinine 1.7.  With her lower extremity edema, hesitant to use Lasix given these findings.  She is on chlorthalidone 50 a day.  Continue with stockings and elevation of legs, conservative measures.   Explained to her that chronic venous insufficiency can lead to some discoloration in the lower extremities.  Hyperlipidemia -Continuing with statin therapy, no myalgias no side effects.  Lab work is being followed by Dr. Sharlett Iles   Medication Adjustments/Labs and Tests Ordered: Current medicines are reviewed at length with the patient today.  Concerns regarding medicines are outlined above.  Orders Placed This Encounter  Procedures  . EKG 12-Lead   No orders of the defined types were placed in this encounter.   Patient Instructions  Medication Instructions:  The current medical regimen is effective;  continue present plan and medications.  *If you need a refill on your cardiac medications before your next appointment, please call your pharmacy*  Follow-Up: At [facility], you and your health needs are our priority.  As part of our continuing mission to provide you with exceptional heart care, we have created designated Provider Care Teams.  These Care Teams include your primary Cardiologist (physician) and Advanced Practice Providers (APPs -  Physician Assistants and Nurse Practitioners) who all work together to provide you with the care you need, when you need it.  We recommend signing up for the patient portal called "MyChart".  Sign up information is provided on this After Visit Summary.  MyChart is used to connect with patients for Virtual Visits (Telemedicine).  Patients are able to view lab/test results, encounter notes, upcoming appointments, etc.  Non-urgent messages can be sent to your provider as well.   To learn more about what you can do with MyChart, go to [email or web address].    Your next appointment:   12 month(s)  The format for your next appointment:   In Person  Provider:   Candee Furbish, MD   Thank you for choosing [facility]!!          Signed, Candee Furbish, MD  08/30/2019 2:08 PM    [place]

## 2019-09-18 ENCOUNTER — Ambulatory Visit: Payer: Medicare Other | Admitting: Rheumatology

## 2020-03-10 DIAGNOSIS — E785 Hyperlipidemia, unspecified: Secondary | ICD-10-CM | POA: Diagnosis not present

## 2020-03-10 DIAGNOSIS — Z8673 Personal history of transient ischemic attack (TIA), and cerebral infarction without residual deficits: Secondary | ICD-10-CM | POA: Diagnosis not present

## 2020-03-10 DIAGNOSIS — Z9013 Acquired absence of bilateral breasts and nipples: Secondary | ICD-10-CM | POA: Diagnosis not present

## 2020-03-10 DIAGNOSIS — M5136 Other intervertebral disc degeneration, lumbar region: Secondary | ICD-10-CM | POA: Diagnosis not present

## 2020-03-10 DIAGNOSIS — Z7901 Long term (current) use of anticoagulants: Secondary | ICD-10-CM | POA: Diagnosis not present

## 2020-03-10 DIAGNOSIS — H9191 Unspecified hearing loss, right ear: Secondary | ICD-10-CM | POA: Diagnosis not present

## 2020-03-10 DIAGNOSIS — M419 Scoliosis, unspecified: Secondary | ICD-10-CM | POA: Diagnosis not present

## 2020-03-10 DIAGNOSIS — I480 Paroxysmal atrial fibrillation: Secondary | ICD-10-CM | POA: Diagnosis not present

## 2020-03-10 DIAGNOSIS — Z853 Personal history of malignant neoplasm of breast: Secondary | ICD-10-CM | POA: Diagnosis not present

## 2020-03-10 DIAGNOSIS — I890 Lymphedema, not elsewhere classified: Secondary | ICD-10-CM | POA: Diagnosis not present

## 2020-03-10 DIAGNOSIS — K589 Irritable bowel syndrome without diarrhea: Secondary | ICD-10-CM | POA: Diagnosis not present

## 2020-03-10 DIAGNOSIS — N1832 Chronic kidney disease, stage 3b: Secondary | ICD-10-CM | POA: Diagnosis not present

## 2020-03-10 DIAGNOSIS — G4733 Obstructive sleep apnea (adult) (pediatric): Secondary | ICD-10-CM | POA: Diagnosis not present

## 2020-03-10 DIAGNOSIS — I129 Hypertensive chronic kidney disease with stage 1 through stage 4 chronic kidney disease, or unspecified chronic kidney disease: Secondary | ICD-10-CM | POA: Diagnosis not present

## 2020-03-10 DIAGNOSIS — F32A Depression, unspecified: Secondary | ICD-10-CM | POA: Diagnosis not present

## 2020-03-10 DIAGNOSIS — I872 Venous insufficiency (chronic) (peripheral): Secondary | ICD-10-CM | POA: Diagnosis not present

## 2020-03-12 DIAGNOSIS — N1832 Chronic kidney disease, stage 3b: Secondary | ICD-10-CM | POA: Diagnosis not present

## 2020-03-12 DIAGNOSIS — M419 Scoliosis, unspecified: Secondary | ICD-10-CM | POA: Diagnosis not present

## 2020-03-12 DIAGNOSIS — I129 Hypertensive chronic kidney disease with stage 1 through stage 4 chronic kidney disease, or unspecified chronic kidney disease: Secondary | ICD-10-CM | POA: Diagnosis not present

## 2020-03-12 DIAGNOSIS — I890 Lymphedema, not elsewhere classified: Secondary | ICD-10-CM | POA: Diagnosis not present

## 2020-03-12 DIAGNOSIS — M5136 Other intervertebral disc degeneration, lumbar region: Secondary | ICD-10-CM | POA: Diagnosis not present

## 2020-03-12 DIAGNOSIS — I872 Venous insufficiency (chronic) (peripheral): Secondary | ICD-10-CM | POA: Diagnosis not present

## 2020-03-14 DIAGNOSIS — I890 Lymphedema, not elsewhere classified: Secondary | ICD-10-CM | POA: Diagnosis not present

## 2020-03-14 DIAGNOSIS — N1832 Chronic kidney disease, stage 3b: Secondary | ICD-10-CM | POA: Diagnosis not present

## 2020-03-14 DIAGNOSIS — I129 Hypertensive chronic kidney disease with stage 1 through stage 4 chronic kidney disease, or unspecified chronic kidney disease: Secondary | ICD-10-CM | POA: Diagnosis not present

## 2020-03-14 DIAGNOSIS — I872 Venous insufficiency (chronic) (peripheral): Secondary | ICD-10-CM | POA: Diagnosis not present

## 2020-03-14 DIAGNOSIS — M419 Scoliosis, unspecified: Secondary | ICD-10-CM | POA: Diagnosis not present

## 2020-03-14 DIAGNOSIS — M5136 Other intervertebral disc degeneration, lumbar region: Secondary | ICD-10-CM | POA: Diagnosis not present

## 2020-03-18 DIAGNOSIS — I872 Venous insufficiency (chronic) (peripheral): Secondary | ICD-10-CM | POA: Diagnosis not present

## 2020-03-18 DIAGNOSIS — M419 Scoliosis, unspecified: Secondary | ICD-10-CM | POA: Diagnosis not present

## 2020-03-18 DIAGNOSIS — I890 Lymphedema, not elsewhere classified: Secondary | ICD-10-CM | POA: Diagnosis not present

## 2020-03-18 DIAGNOSIS — I129 Hypertensive chronic kidney disease with stage 1 through stage 4 chronic kidney disease, or unspecified chronic kidney disease: Secondary | ICD-10-CM | POA: Diagnosis not present

## 2020-03-18 DIAGNOSIS — N1832 Chronic kidney disease, stage 3b: Secondary | ICD-10-CM | POA: Diagnosis not present

## 2020-03-18 DIAGNOSIS — M5136 Other intervertebral disc degeneration, lumbar region: Secondary | ICD-10-CM | POA: Diagnosis not present

## 2020-03-26 DIAGNOSIS — Z1152 Encounter for screening for COVID-19: Secondary | ICD-10-CM | POA: Diagnosis not present

## 2020-03-26 DIAGNOSIS — R0981 Nasal congestion: Secondary | ICD-10-CM | POA: Diagnosis not present

## 2020-04-04 DIAGNOSIS — N1832 Chronic kidney disease, stage 3b: Secondary | ICD-10-CM | POA: Diagnosis not present

## 2020-04-04 DIAGNOSIS — M5136 Other intervertebral disc degeneration, lumbar region: Secondary | ICD-10-CM | POA: Diagnosis not present

## 2020-04-04 DIAGNOSIS — I129 Hypertensive chronic kidney disease with stage 1 through stage 4 chronic kidney disease, or unspecified chronic kidney disease: Secondary | ICD-10-CM | POA: Diagnosis not present

## 2020-04-04 DIAGNOSIS — M419 Scoliosis, unspecified: Secondary | ICD-10-CM | POA: Diagnosis not present

## 2020-04-04 DIAGNOSIS — I872 Venous insufficiency (chronic) (peripheral): Secondary | ICD-10-CM | POA: Diagnosis not present

## 2020-04-04 DIAGNOSIS — I890 Lymphedema, not elsewhere classified: Secondary | ICD-10-CM | POA: Diagnosis not present

## 2020-04-08 DIAGNOSIS — I890 Lymphedema, not elsewhere classified: Secondary | ICD-10-CM | POA: Diagnosis not present

## 2020-04-08 DIAGNOSIS — I129 Hypertensive chronic kidney disease with stage 1 through stage 4 chronic kidney disease, or unspecified chronic kidney disease: Secondary | ICD-10-CM | POA: Diagnosis not present

## 2020-04-08 DIAGNOSIS — M5136 Other intervertebral disc degeneration, lumbar region: Secondary | ICD-10-CM | POA: Diagnosis not present

## 2020-04-08 DIAGNOSIS — I480 Paroxysmal atrial fibrillation: Secondary | ICD-10-CM | POA: Diagnosis not present

## 2020-04-08 DIAGNOSIS — N1832 Chronic kidney disease, stage 3b: Secondary | ICD-10-CM | POA: Diagnosis not present

## 2020-04-08 DIAGNOSIS — M419 Scoliosis, unspecified: Secondary | ICD-10-CM | POA: Diagnosis not present

## 2020-04-08 DIAGNOSIS — Z7901 Long term (current) use of anticoagulants: Secondary | ICD-10-CM | POA: Diagnosis not present

## 2020-04-08 DIAGNOSIS — I872 Venous insufficiency (chronic) (peripheral): Secondary | ICD-10-CM | POA: Diagnosis not present

## 2020-04-09 DIAGNOSIS — Z9013 Acquired absence of bilateral breasts and nipples: Secondary | ICD-10-CM | POA: Diagnosis not present

## 2020-04-09 DIAGNOSIS — M419 Scoliosis, unspecified: Secondary | ICD-10-CM | POA: Diagnosis not present

## 2020-04-09 DIAGNOSIS — I872 Venous insufficiency (chronic) (peripheral): Secondary | ICD-10-CM | POA: Diagnosis not present

## 2020-04-09 DIAGNOSIS — N1832 Chronic kidney disease, stage 3b: Secondary | ICD-10-CM | POA: Diagnosis not present

## 2020-04-09 DIAGNOSIS — Z853 Personal history of malignant neoplasm of breast: Secondary | ICD-10-CM | POA: Diagnosis not present

## 2020-04-09 DIAGNOSIS — K589 Irritable bowel syndrome without diarrhea: Secondary | ICD-10-CM | POA: Diagnosis not present

## 2020-04-09 DIAGNOSIS — I480 Paroxysmal atrial fibrillation: Secondary | ICD-10-CM | POA: Diagnosis not present

## 2020-04-09 DIAGNOSIS — Z8673 Personal history of transient ischemic attack (TIA), and cerebral infarction without residual deficits: Secondary | ICD-10-CM | POA: Diagnosis not present

## 2020-04-09 DIAGNOSIS — I890 Lymphedema, not elsewhere classified: Secondary | ICD-10-CM | POA: Diagnosis not present

## 2020-04-09 DIAGNOSIS — F32A Depression, unspecified: Secondary | ICD-10-CM | POA: Diagnosis not present

## 2020-04-09 DIAGNOSIS — Z7901 Long term (current) use of anticoagulants: Secondary | ICD-10-CM | POA: Diagnosis not present

## 2020-04-09 DIAGNOSIS — M5136 Other intervertebral disc degeneration, lumbar region: Secondary | ICD-10-CM | POA: Diagnosis not present

## 2020-04-09 DIAGNOSIS — I129 Hypertensive chronic kidney disease with stage 1 through stage 4 chronic kidney disease, or unspecified chronic kidney disease: Secondary | ICD-10-CM | POA: Diagnosis not present

## 2020-04-09 DIAGNOSIS — E785 Hyperlipidemia, unspecified: Secondary | ICD-10-CM | POA: Diagnosis not present

## 2020-04-09 DIAGNOSIS — H9191 Unspecified hearing loss, right ear: Secondary | ICD-10-CM | POA: Diagnosis not present

## 2020-04-09 DIAGNOSIS — G4733 Obstructive sleep apnea (adult) (pediatric): Secondary | ICD-10-CM | POA: Diagnosis not present

## 2020-04-11 DIAGNOSIS — M5136 Other intervertebral disc degeneration, lumbar region: Secondary | ICD-10-CM | POA: Diagnosis not present

## 2020-04-11 DIAGNOSIS — I872 Venous insufficiency (chronic) (peripheral): Secondary | ICD-10-CM | POA: Diagnosis not present

## 2020-04-11 DIAGNOSIS — I129 Hypertensive chronic kidney disease with stage 1 through stage 4 chronic kidney disease, or unspecified chronic kidney disease: Secondary | ICD-10-CM | POA: Diagnosis not present

## 2020-04-11 DIAGNOSIS — I890 Lymphedema, not elsewhere classified: Secondary | ICD-10-CM | POA: Diagnosis not present

## 2020-04-11 DIAGNOSIS — M419 Scoliosis, unspecified: Secondary | ICD-10-CM | POA: Diagnosis not present

## 2020-04-11 DIAGNOSIS — N1832 Chronic kidney disease, stage 3b: Secondary | ICD-10-CM | POA: Diagnosis not present

## 2020-04-15 DIAGNOSIS — I890 Lymphedema, not elsewhere classified: Secondary | ICD-10-CM | POA: Diagnosis not present

## 2020-04-15 DIAGNOSIS — N1832 Chronic kidney disease, stage 3b: Secondary | ICD-10-CM | POA: Diagnosis not present

## 2020-04-15 DIAGNOSIS — M419 Scoliosis, unspecified: Secondary | ICD-10-CM | POA: Diagnosis not present

## 2020-04-15 DIAGNOSIS — M5136 Other intervertebral disc degeneration, lumbar region: Secondary | ICD-10-CM | POA: Diagnosis not present

## 2020-04-15 DIAGNOSIS — I129 Hypertensive chronic kidney disease with stage 1 through stage 4 chronic kidney disease, or unspecified chronic kidney disease: Secondary | ICD-10-CM | POA: Diagnosis not present

## 2020-04-15 DIAGNOSIS — I872 Venous insufficiency (chronic) (peripheral): Secondary | ICD-10-CM | POA: Diagnosis not present

## 2020-04-18 DIAGNOSIS — N1832 Chronic kidney disease, stage 3b: Secondary | ICD-10-CM | POA: Diagnosis not present

## 2020-04-18 DIAGNOSIS — I890 Lymphedema, not elsewhere classified: Secondary | ICD-10-CM | POA: Diagnosis not present

## 2020-04-18 DIAGNOSIS — M5136 Other intervertebral disc degeneration, lumbar region: Secondary | ICD-10-CM | POA: Diagnosis not present

## 2020-04-18 DIAGNOSIS — I872 Venous insufficiency (chronic) (peripheral): Secondary | ICD-10-CM | POA: Diagnosis not present

## 2020-04-18 DIAGNOSIS — M419 Scoliosis, unspecified: Secondary | ICD-10-CM | POA: Diagnosis not present

## 2020-04-18 DIAGNOSIS — I129 Hypertensive chronic kidney disease with stage 1 through stage 4 chronic kidney disease, or unspecified chronic kidney disease: Secondary | ICD-10-CM | POA: Diagnosis not present

## 2020-04-21 DIAGNOSIS — N1832 Chronic kidney disease, stage 3b: Secondary | ICD-10-CM | POA: Diagnosis not present

## 2020-04-21 DIAGNOSIS — M419 Scoliosis, unspecified: Secondary | ICD-10-CM | POA: Diagnosis not present

## 2020-04-21 DIAGNOSIS — I872 Venous insufficiency (chronic) (peripheral): Secondary | ICD-10-CM | POA: Diagnosis not present

## 2020-04-21 DIAGNOSIS — M5136 Other intervertebral disc degeneration, lumbar region: Secondary | ICD-10-CM | POA: Diagnosis not present

## 2020-04-21 DIAGNOSIS — I129 Hypertensive chronic kidney disease with stage 1 through stage 4 chronic kidney disease, or unspecified chronic kidney disease: Secondary | ICD-10-CM | POA: Diagnosis not present

## 2020-04-21 DIAGNOSIS — I890 Lymphedema, not elsewhere classified: Secondary | ICD-10-CM | POA: Diagnosis not present

## 2020-04-22 ENCOUNTER — Inpatient Hospital Stay (HOSPITAL_COMMUNITY): Payer: Medicare Other

## 2020-04-22 ENCOUNTER — Encounter (HOSPITAL_COMMUNITY): Payer: Self-pay | Admitting: Emergency Medicine

## 2020-04-22 ENCOUNTER — Other Ambulatory Visit: Payer: Self-pay

## 2020-04-22 ENCOUNTER — Emergency Department (HOSPITAL_COMMUNITY): Payer: Medicare Other

## 2020-04-22 ENCOUNTER — Inpatient Hospital Stay (HOSPITAL_COMMUNITY)
Admission: EM | Admit: 2020-04-22 | Discharge: 2020-04-29 | DRG: 480 | Disposition: A | Discharge status: Rehabilitation Facility | Payer: Medicare Other | Attending: Internal Medicine | Admitting: Internal Medicine

## 2020-04-22 DIAGNOSIS — W19XXXA Unspecified fall, initial encounter: Secondary | ICD-10-CM | POA: Diagnosis not present

## 2020-04-22 DIAGNOSIS — E559 Vitamin D deficiency, unspecified: Secondary | ICD-10-CM | POA: Diagnosis present

## 2020-04-22 DIAGNOSIS — R0989 Other specified symptoms and signs involving the circulatory and respiratory systems: Secondary | ICD-10-CM | POA: Diagnosis not present

## 2020-04-22 DIAGNOSIS — S72002A Fracture of unspecified part of neck of left femur, initial encounter for closed fracture: Secondary | ICD-10-CM | POA: Diagnosis not present

## 2020-04-22 DIAGNOSIS — N183 Chronic kidney disease, stage 3 unspecified: Secondary | ICD-10-CM | POA: Diagnosis not present

## 2020-04-22 DIAGNOSIS — S72142S Displaced intertrochanteric fracture of left femur, sequela: Secondary | ICD-10-CM | POA: Diagnosis not present

## 2020-04-22 DIAGNOSIS — Z853 Personal history of malignant neoplasm of breast: Secondary | ICD-10-CM | POA: Diagnosis not present

## 2020-04-22 DIAGNOSIS — G4730 Sleep apnea, unspecified: Secondary | ICD-10-CM | POA: Diagnosis present

## 2020-04-22 DIAGNOSIS — R1310 Dysphagia, unspecified: Secondary | ICD-10-CM | POA: Diagnosis not present

## 2020-04-22 DIAGNOSIS — B965 Pseudomonas (aeruginosa) (mallei) (pseudomallei) as the cause of diseases classified elsewhere: Secondary | ICD-10-CM | POA: Diagnosis present

## 2020-04-22 DIAGNOSIS — Z96653 Presence of artificial knee joint, bilateral: Secondary | ICD-10-CM | POA: Diagnosis present

## 2020-04-22 DIAGNOSIS — R471 Dysarthria and anarthria: Secondary | ICD-10-CM | POA: Diagnosis not present

## 2020-04-22 DIAGNOSIS — F05 Delirium due to known physiological condition: Secondary | ICD-10-CM | POA: Diagnosis not present

## 2020-04-22 DIAGNOSIS — D638 Anemia in other chronic diseases classified elsewhere: Secondary | ICD-10-CM | POA: Diagnosis not present

## 2020-04-22 DIAGNOSIS — E872 Acidosis: Secondary | ICD-10-CM | POA: Diagnosis not present

## 2020-04-22 DIAGNOSIS — Z8249 Family history of ischemic heart disease and other diseases of the circulatory system: Secondary | ICD-10-CM

## 2020-04-22 DIAGNOSIS — Z20822 Contact with and (suspected) exposure to covid-19: Secondary | ICD-10-CM | POA: Diagnosis not present

## 2020-04-22 DIAGNOSIS — N179 Acute kidney failure, unspecified: Secondary | ICD-10-CM | POA: Diagnosis not present

## 2020-04-22 DIAGNOSIS — R29810 Facial weakness: Secondary | ICD-10-CM | POA: Diagnosis present

## 2020-04-22 DIAGNOSIS — Z419 Encounter for procedure for purposes other than remedying health state, unspecified: Secondary | ICD-10-CM

## 2020-04-22 DIAGNOSIS — Z8673 Personal history of transient ischemic attack (TIA), and cerebral infarction without residual deficits: Secondary | ICD-10-CM

## 2020-04-22 DIAGNOSIS — R410 Disorientation, unspecified: Secondary | ICD-10-CM | POA: Diagnosis not present

## 2020-04-22 DIAGNOSIS — S72142A Displaced intertrochanteric fracture of left femur, initial encounter for closed fracture: Principal | ICD-10-CM

## 2020-04-22 DIAGNOSIS — M7522 Bicipital tendinitis, left shoulder: Secondary | ICD-10-CM | POA: Diagnosis present

## 2020-04-22 DIAGNOSIS — Z91040 Latex allergy status: Secondary | ICD-10-CM

## 2020-04-22 DIAGNOSIS — Z8616 Personal history of COVID-19: Secondary | ICD-10-CM

## 2020-04-22 DIAGNOSIS — G8918 Other acute postprocedural pain: Secondary | ICD-10-CM | POA: Diagnosis not present

## 2020-04-22 DIAGNOSIS — Z9013 Acquired absence of bilateral breasts and nipples: Secondary | ICD-10-CM

## 2020-04-22 DIAGNOSIS — D62 Acute posthemorrhagic anemia: Secondary | ICD-10-CM | POA: Diagnosis not present

## 2020-04-22 DIAGNOSIS — F32A Depression, unspecified: Secondary | ICD-10-CM | POA: Diagnosis present

## 2020-04-22 DIAGNOSIS — I639 Cerebral infarction, unspecified: Secondary | ICD-10-CM | POA: Diagnosis not present

## 2020-04-22 DIAGNOSIS — Z880 Allergy status to penicillin: Secondary | ICD-10-CM

## 2020-04-22 DIAGNOSIS — W050XXD Fall from non-moving wheelchair, subsequent encounter: Secondary | ICD-10-CM | POA: Diagnosis present

## 2020-04-22 DIAGNOSIS — H3530 Unspecified macular degeneration: Secondary | ICD-10-CM | POA: Diagnosis present

## 2020-04-22 DIAGNOSIS — H9190 Unspecified hearing loss, unspecified ear: Secondary | ICD-10-CM | POA: Diagnosis present

## 2020-04-22 DIAGNOSIS — R0981 Nasal congestion: Secondary | ICD-10-CM | POA: Diagnosis not present

## 2020-04-22 DIAGNOSIS — I63441 Cerebral infarction due to embolism of right cerebellar artery: Secondary | ICD-10-CM | POA: Diagnosis present

## 2020-04-22 DIAGNOSIS — I6389 Other cerebral infarction: Secondary | ICD-10-CM | POA: Diagnosis not present

## 2020-04-22 DIAGNOSIS — N390 Urinary tract infection, site not specified: Secondary | ICD-10-CM | POA: Diagnosis not present

## 2020-04-22 DIAGNOSIS — T148XXA Other injury of unspecified body region, initial encounter: Secondary | ICD-10-CM

## 2020-04-22 DIAGNOSIS — R059 Cough, unspecified: Secondary | ICD-10-CM

## 2020-04-22 DIAGNOSIS — E785 Hyperlipidemia, unspecified: Secondary | ICD-10-CM | POA: Diagnosis present

## 2020-04-22 DIAGNOSIS — Z882 Allergy status to sulfonamides status: Secondary | ICD-10-CM

## 2020-04-22 DIAGNOSIS — N1832 Chronic kidney disease, stage 3b: Secondary | ICD-10-CM | POA: Diagnosis not present

## 2020-04-22 DIAGNOSIS — K219 Gastro-esophageal reflux disease without esophagitis: Secondary | ICD-10-CM | POA: Diagnosis present

## 2020-04-22 DIAGNOSIS — M25552 Pain in left hip: Secondary | ICD-10-CM | POA: Diagnosis not present

## 2020-04-22 DIAGNOSIS — S72145D Nondisplaced intertrochanteric fracture of left femur, subsequent encounter for closed fracture with routine healing: Secondary | ICD-10-CM | POA: Diagnosis not present

## 2020-04-22 DIAGNOSIS — S72002D Fracture of unspecified part of neck of left femur, subsequent encounter for closed fracture with routine healing: Secondary | ICD-10-CM | POA: Diagnosis not present

## 2020-04-22 DIAGNOSIS — M545 Low back pain, unspecified: Secondary | ICD-10-CM | POA: Diagnosis not present

## 2020-04-22 DIAGNOSIS — D631 Anemia in chronic kidney disease: Secondary | ICD-10-CM | POA: Diagnosis present

## 2020-04-22 DIAGNOSIS — L89312 Pressure ulcer of right buttock, stage 2: Secondary | ICD-10-CM | POA: Diagnosis present

## 2020-04-22 DIAGNOSIS — Z87440 Personal history of urinary (tract) infections: Secondary | ICD-10-CM

## 2020-04-22 DIAGNOSIS — I129 Hypertensive chronic kidney disease with stage 1 through stage 4 chronic kidney disease, or unspecified chronic kidney disease: Secondary | ICD-10-CM | POA: Diagnosis not present

## 2020-04-22 DIAGNOSIS — M19012 Primary osteoarthritis, left shoulder: Secondary | ICD-10-CM | POA: Diagnosis not present

## 2020-04-22 DIAGNOSIS — M810 Age-related osteoporosis without current pathological fracture: Secondary | ICD-10-CM | POA: Diagnosis present

## 2020-04-22 DIAGNOSIS — H9191 Unspecified hearing loss, right ear: Secondary | ICD-10-CM | POA: Diagnosis present

## 2020-04-22 DIAGNOSIS — Z7901 Long term (current) use of anticoagulants: Secondary | ICD-10-CM | POA: Diagnosis not present

## 2020-04-22 DIAGNOSIS — I10 Essential (primary) hypertension: Secondary | ICD-10-CM

## 2020-04-22 DIAGNOSIS — M978XXA Periprosthetic fracture around other internal prosthetic joint, initial encounter: Secondary | ICD-10-CM | POA: Diagnosis not present

## 2020-04-22 DIAGNOSIS — N1831 Chronic kidney disease, stage 3a: Secondary | ICD-10-CM | POA: Diagnosis not present

## 2020-04-22 DIAGNOSIS — D72829 Elevated white blood cell count, unspecified: Secondary | ICD-10-CM | POA: Diagnosis not present

## 2020-04-22 DIAGNOSIS — R296 Repeated falls: Secondary | ICD-10-CM | POA: Diagnosis present

## 2020-04-22 DIAGNOSIS — Y92009 Unspecified place in unspecified non-institutional (private) residence as the place of occurrence of the external cause: Secondary | ICD-10-CM | POA: Diagnosis not present

## 2020-04-22 DIAGNOSIS — W050XXA Fall from non-moving wheelchair, initial encounter: Secondary | ICD-10-CM | POA: Diagnosis present

## 2020-04-22 DIAGNOSIS — R791 Abnormal coagulation profile: Secondary | ICD-10-CM | POA: Diagnosis present

## 2020-04-22 DIAGNOSIS — Z6832 Body mass index (BMI) 32.0-32.9, adult: Secondary | ICD-10-CM

## 2020-04-22 DIAGNOSIS — S72145A Nondisplaced intertrochanteric fracture of left femur, initial encounter for closed fracture: Secondary | ICD-10-CM | POA: Diagnosis not present

## 2020-04-22 DIAGNOSIS — Z823 Family history of stroke: Secondary | ICD-10-CM

## 2020-04-22 DIAGNOSIS — Z1629 Resistance to other single specified antibiotic: Secondary | ICD-10-CM | POA: Diagnosis not present

## 2020-04-22 DIAGNOSIS — Z993 Dependence on wheelchair: Secondary | ICD-10-CM

## 2020-04-22 DIAGNOSIS — G9341 Metabolic encephalopathy: Secondary | ICD-10-CM | POA: Diagnosis not present

## 2020-04-22 DIAGNOSIS — G479 Sleep disorder, unspecified: Secondary | ICD-10-CM | POA: Diagnosis present

## 2020-04-22 DIAGNOSIS — I69392 Facial weakness following cerebral infarction: Secondary | ICD-10-CM | POA: Diagnosis not present

## 2020-04-22 DIAGNOSIS — G629 Polyneuropathy, unspecified: Secondary | ICD-10-CM | POA: Diagnosis not present

## 2020-04-22 DIAGNOSIS — I482 Chronic atrial fibrillation, unspecified: Secondary | ICD-10-CM | POA: Diagnosis not present

## 2020-04-22 DIAGNOSIS — M1990 Unspecified osteoarthritis, unspecified site: Secondary | ICD-10-CM | POA: Diagnosis present

## 2020-04-22 DIAGNOSIS — I6310 Cerebral infarction due to embolism of unspecified precerebral artery: Secondary | ICD-10-CM | POA: Diagnosis not present

## 2020-04-22 DIAGNOSIS — I351 Nonrheumatic aortic (valve) insufficiency: Secondary | ICD-10-CM | POA: Diagnosis present

## 2020-04-22 DIAGNOSIS — M549 Dorsalgia, unspecified: Secondary | ICD-10-CM | POA: Diagnosis not present

## 2020-04-22 DIAGNOSIS — A499 Bacterial infection, unspecified: Secondary | ICD-10-CM | POA: Diagnosis not present

## 2020-04-22 DIAGNOSIS — I6340 Cerebral infarction due to embolism of unspecified cerebral artery: Secondary | ICD-10-CM | POA: Insufficient documentation

## 2020-04-22 DIAGNOSIS — I480 Paroxysmal atrial fibrillation: Secondary | ICD-10-CM | POA: Diagnosis not present

## 2020-04-22 DIAGNOSIS — Z79899 Other long term (current) drug therapy: Secondary | ICD-10-CM

## 2020-04-22 DIAGNOSIS — I517 Cardiomegaly: Secondary | ICD-10-CM | POA: Diagnosis not present

## 2020-04-22 DIAGNOSIS — R52 Pain, unspecified: Secondary | ICD-10-CM | POA: Diagnosis not present

## 2020-04-22 DIAGNOSIS — B961 Klebsiella pneumoniae [K. pneumoniae] as the cause of diseases classified elsewhere: Secondary | ICD-10-CM | POA: Diagnosis present

## 2020-04-22 DIAGNOSIS — Z01818 Encounter for other preprocedural examination: Secondary | ICD-10-CM | POA: Diagnosis not present

## 2020-04-22 DIAGNOSIS — I672 Cerebral atherosclerosis: Secondary | ICD-10-CM | POA: Diagnosis present

## 2020-04-22 DIAGNOSIS — E871 Hypo-osmolality and hyponatremia: Secondary | ICD-10-CM | POA: Diagnosis not present

## 2020-04-22 DIAGNOSIS — S72142D Displaced intertrochanteric fracture of left femur, subsequent encounter for closed fracture with routine healing: Secondary | ICD-10-CM | POA: Diagnosis not present

## 2020-04-22 DIAGNOSIS — Z888 Allergy status to other drugs, medicaments and biological substances status: Secondary | ICD-10-CM

## 2020-04-22 DIAGNOSIS — E669 Obesity, unspecified: Secondary | ICD-10-CM | POA: Diagnosis present

## 2020-04-22 LAB — CBC WITH DIFFERENTIAL/PLATELET
Abs Immature Granulocytes: 0.07 10*3/uL (ref 0.00–0.07)
Basophils Absolute: 0 10*3/uL (ref 0.0–0.1)
Basophils Relative: 0 %
Eosinophils Absolute: 0 10*3/uL (ref 0.0–0.5)
Eosinophils Relative: 0 %
HCT: 30.8 % — ABNORMAL LOW (ref 36.0–46.0)
Hemoglobin: 10.1 g/dL — ABNORMAL LOW (ref 12.0–15.0)
Immature Granulocytes: 1 %
Lymphocytes Relative: 6 %
Lymphs Abs: 0.7 10*3/uL (ref 0.7–4.0)
MCH: 29 pg (ref 26.0–34.0)
MCHC: 32.8 g/dL (ref 30.0–36.0)
MCV: 88.5 fL (ref 80.0–100.0)
Monocytes Absolute: 0.4 10*3/uL (ref 0.1–1.0)
Monocytes Relative: 4 %
Neutro Abs: 10.4 10*3/uL — ABNORMAL HIGH (ref 1.7–7.7)
Neutrophils Relative %: 89 %
Platelets: 269 10*3/uL (ref 150–400)
RBC: 3.48 MIL/uL — ABNORMAL LOW (ref 3.87–5.11)
RDW: 13.8 % (ref 11.5–15.5)
WBC: 11.7 10*3/uL — ABNORMAL HIGH (ref 4.0–10.5)
nRBC: 0 % (ref 0.0–0.2)

## 2020-04-22 LAB — SARS CORONAVIRUS 2 (TAT 6-24 HRS): SARS Coronavirus 2: NEGATIVE

## 2020-04-22 LAB — PROTIME-INR
INR: 2.3 — ABNORMAL HIGH (ref 0.8–1.2)
Prothrombin Time: 24.6 seconds — ABNORMAL HIGH (ref 11.4–15.2)

## 2020-04-22 LAB — BASIC METABOLIC PANEL
Anion gap: 11 (ref 5–15)
BUN: 30 mg/dL — ABNORMAL HIGH (ref 8–23)
CO2: 23 mmol/L (ref 22–32)
Calcium: 9.1 mg/dL (ref 8.9–10.3)
Chloride: 105 mmol/L (ref 98–111)
Creatinine, Ser: 1.46 mg/dL — ABNORMAL HIGH (ref 0.44–1.00)
GFR, Estimated: 35 mL/min — ABNORMAL LOW (ref >60)
Glucose, Bld: 165 mg/dL — ABNORMAL HIGH (ref 70–99)
Potassium: 4.1 mmol/L (ref 3.5–5.1)
Sodium: 139 mmol/L (ref 135–145)

## 2020-04-22 MED ORDER — ACETAMINOPHEN 650 MG RE SUPP: 650.0000 mg | Freq: Four times a day (QID) | RECTAL | Status: DC | PRN

## 2020-04-22 MED ORDER — DIPHENHYDRAMINE HCL 25 MG PO CAPS
25.0000 mg | ORAL_CAPSULE | Freq: Once | ORAL | Status: AC
  Administered 2020-04-22: 25 mg via ORAL
  Filled 2020-04-22: qty 1

## 2020-04-22 MED ORDER — FENTANYL CITRATE (PF) 100 MCG/2ML IJ SOLN
25.0000 ug | Freq: Once | INTRAMUSCULAR | Status: DC
Start: 2020-04-22 — End: 2020-04-24

## 2020-04-22 MED ORDER — FENTANYL CITRATE (PF) 100 MCG/2ML IJ SOLN
100.0000 ug | Freq: Once | INTRAMUSCULAR | Status: AC
  Administered 2020-04-22: 100 ug via INTRAVENOUS
  Filled 2020-04-22: qty 2

## 2020-04-22 MED ORDER — ATORVASTATIN CALCIUM 10 MG PO TABS
20.0000 mg | ORAL_TABLET | Freq: Every day | ORAL | Status: DC
  Administered 2020-04-22 – 2020-04-28 (×7): 20 mg via ORAL
  Filled 2020-04-22 (×7): qty 2

## 2020-04-22 MED ORDER — ACETAMINOPHEN 325 MG PO TABS
650.0000 mg | ORAL_TABLET | Freq: Four times a day (QID) | ORAL | Status: DC | PRN
  Administered 2020-04-24 – 2020-04-29 (×11): 650 mg via ORAL
  Filled 2020-04-22 (×12): qty 2

## 2020-04-22 MED ORDER — ACETAMINOPHEN 500 MG PO TABS
500.0000 mg | ORAL_TABLET | Freq: Once | ORAL | Status: AC
  Administered 2020-04-22: 500 mg via ORAL
  Filled 2020-04-22: qty 1

## 2020-04-22 MED ORDER — ACETAMINOPHEN 500 MG PO TABS
1000.0000 mg | ORAL_TABLET | Freq: Once | ORAL | Status: AC
  Administered 2020-04-22: 1000 mg via ORAL
  Filled 2020-04-22: qty 2

## 2020-04-22 MED ORDER — METOPROLOL SUCCINATE ER 25 MG PO TB24
25.0000 mg | ORAL_TABLET | Freq: Every day | ORAL | Status: DC
  Administered 2020-04-24 – 2020-04-29 (×6): 25 mg via ORAL
  Filled 2020-04-22 (×7): qty 1

## 2020-04-22 MED ORDER — NALOXONE HCL 0.4 MG/ML IJ SOLN: 0.4000 mg | INTRAMUSCULAR | Status: DC | PRN

## 2020-04-22 MED ORDER — OXYCODONE-ACETAMINOPHEN 5-325 MG PO TABS
1.0000 | ORAL_TABLET | Freq: Once | ORAL | Status: DC
  Filled 2020-04-22: qty 1

## 2020-04-22 MED ORDER — VITAMIN K1 10 MG/ML IJ SOLN
5.0000 mg | Freq: Once | INTRAVENOUS | Status: AC
  Administered 2020-04-22: 5 mg via INTRAVENOUS
  Filled 2020-04-22: qty 0.5

## 2020-04-22 MED ORDER — FENTANYL CITRATE (PF) 100 MCG/2ML IJ SOLN
25.0000 ug | INTRAMUSCULAR | Status: DC | PRN
  Administered 2020-04-22 – 2020-04-23 (×7): 25 ug via INTRAVENOUS
  Filled 2020-04-22 (×7): qty 2

## 2020-04-22 MED ORDER — PANTOPRAZOLE SODIUM 40 MG IV SOLR
40.0000 mg | INTRAVENOUS | Status: DC
  Administered 2020-04-23 – 2020-04-28 (×6): 40 mg via INTRAVENOUS
  Filled 2020-04-22 (×6): qty 40

## 2020-04-22 NOTE — H&P (View-Only) (Signed)
Reason for Consult:Left hip fx Referring Physician: R Dykstra Time called: 1554 Time at bedside: 1608   [name] is an 87 y.o. female.  HPI: [name] was at home today and trying to put down an exercise wheel when she felt herself slipping out of her WC. She was unable to stop herself and fell onto her left side. She had immediate hip and back pain and could not get up. She was brought to the ED where x-rays showed a left hip fx and orthopedic surgery was consulted. She lives at home with her husband and ambulates with a RW.  Past Medical History:  Diagnosis Date  . Arthritis    "fingers, hips, feet, ankles" (11/10/2012)  . Asthma   . Atrial fibrillation (HCC)    CHRONIC COUMADIN  . Breast cancer (HCC) 1990's   "cancer on one side; precancerous tissue on the other" (11/10/2012)  . Chronic bronchitis (HCC)    "multiple times; not in a long time" (11/10/2012  . Depression   . GERD (gastroesophageal reflux disease)   . Hearing impaired   . Hypertension   . Irritable bowel   . Lymphedema    HX OF - IN LEFT ARM--NO NEEDLES OR B/P'S LEFT ARM  . Macular degeneration    "BEGINNINGS OF" MACULAR DEGENERATION  . Osteoporosis   . Pneumonia    "multiple times; not in a long time" (11/10/2012)  . Recurrent UTI   . Sleep apnea    "dx'd w/it; don't wear mask or anything" (11/10/2012)  . Stroke (HCC) ~ 2005   HX OF TIA-NO RESIDUAL PROBLEM--PARALYSIS RT SIDE FACE /PT'S MOUTH DROOPS-AND LOSS OF HEARING RT EAR --SINCE EAR SURGERY AS A CHILD   . UTI (lower urinary tract infection)    FREQUENT    Past Surgical History:  Procedure Laterality Date  . BREAST BIOPSY Bilateral 1990's  . Dacryocystorhinostomy  02/18/2016  . INNER EAR SURGERY Right    MULTIPLE EAR SURGERIES,  . JOINT REPLACEMENT    . KNEE ARTHROSCOPY  04/07/2012   Procedure: ARTHROSCOPY KNEE;  Surgeon: Frank V Aluisio, MD;  Location: [facility];  Service: Orthopedics;  Laterality: Left;  WITH SYNOVECTOMY  . MASTECTOMY  Bilateral 1990's  . MASTOIDECTOMY Right 1942  . ORIF FEMUR FRACTURE Left 08/08/2017   Procedure: OPEN REDUCTION INTERNAL FIXATION (ORIF) DISTAL FEMUR FRACTURE;  Surgeon: Haddix, Kevin P, MD;  Location: [facility];  Service: Orthopedics;  Laterality: Left;  . TONSILLECTOMY    . TOTAL KNEE ARTHROPLASTY  09/20/2011   LEFT TOTAL KNEE ARTHROPLASTY;  Surgeon: Frank V Aluisio, MD;  Location: [facility];  Service: Orthopedics;  Laterality: Left;  . TOTAL KNEE ARTHROPLASTY  2006    Family History  Problem Relation Age of Onset  . CAD Mother   . Stroke Mother   . CAD Father     Social History:  reports that she has never smoked. She has never used smokeless tobacco. She reports that she does not drink alcohol and does not use drugs.  Allergies:  Allergies  Allergen Reactions  . Sulfamethoxazole Diarrhea  . Ampicillin Other (See Comments)    C-DIF AFTER TAKING AMPICILLIN    Has patient had a PCN reaction causing immediate rash, facial/tongue/throat swelling, SOB or lightheadedness with hypotension: No Has patient had a PCN reaction causing severe rash involving mucus membranes or skin necrosis: No Has patient had a PCN reaction that required hospitalization: No Has patient had a PCN reaction occurring within the last 10 years: No If   all of the above answers are "NO", then may proceed with Cephalosporin use.  . Clindamycin/Lincomycin Other (See Comments)    PT STATES HER DOCTOR TOLD HER NOT TO TAKE CLINDAMYCIN BECAUSE SHE GOT C-DIFF AFTER TAKING AMPICILLIN  . Latex Other (See Comments)    Blisters   . Other Other (See Comments)    PT STATES HER DOCTOR TOLD HER NOT TO TAKE CLINDAMYCIN BECAUSE SHE GOT C-DIFF AFTER TAKING AMPICILLIN  . Pradaxa [Dabigatran Etexilate Mesylate] Other (See Comments)    INTERNAL BLEEDING    Medications: I have reviewed the patient's current medications.  No results found for this or any previous visit (from the past 48 hour(s)).  DG Chest 1 View  Result Date:  04/22/2020 CLINICAL DATA:  Preoperative evaluation EXAM: CHEST  1 VIEW COMPARISON:  08/06/2017 FINDINGS: Chronic interstitial prominence with scarring at the lung bases. Possible mild superimposed interstitial edema. Mild cardiomegaly. No pleural effusion or pneumothorax. IMPRESSION: Chronic interstitial prominence with possible superimposed mild interstitial edema. Mild cardiomegaly. Electronically Signed   By: Macy Mis M.D.   On: 04/22/2020 16:09    Review of Systems  HENT: Negative for ear discharge, ear pain, hearing loss and tinnitus.   Eyes: Negative for photophobia and pain.  Respiratory: Negative for cough and shortness of breath.   Cardiovascular: Negative for chest pain.  Gastrointestinal: Negative for abdominal pain, nausea and vomiting.  Genitourinary: Negative for dysuria, flank pain, frequency and urgency.  Musculoskeletal: Positive for arthralgias (Left hip/back). Negative for back pain, myalgias and neck pain.  Neurological: Negative for dizziness and headaches.  Hematological: Does not bruise/bleed easily.  Psychiatric/Behavioral: The patient is not nervous/anxious.    Blood pressure (!) 156/71, pulse 72, temperature 97.8 F (36.6 C), temperature source Oral, resp. rate 20, SpO2 98 %. Physical Exam Constitutional:      General: She is not in acute distress.    Appearance: She is well-developed and well-nourished. She is not diaphoretic.  HENT:     Head: Normocephalic and atraumatic.  Eyes:     General: No scleral icterus.       Right eye: No discharge.        Left eye: No discharge.     Conjunctiva/sclera: Conjunctivae normal.  Cardiovascular:     Rate and Rhythm: Normal rate and regular rhythm.  Pulmonary:     Effort: Pulmonary effort is normal. No respiratory distress.  Musculoskeletal:     Cervical back: Normal range of motion.     Comments: LLE No traumatic wounds, ecchymosis, or rash  Mod TTP hip  No knee or ankle effusion  Knee stable to varus/ valgus  and anterior/posterior stress  Sens DPN, SPN, TN intact  Motor EHL, ext, flex, evers 5/5  DP 2+, PT 1+, No significant edema  Skin:    General: Skin is warm and dry.  Neurological:     Mental Status: She is alert.  Psychiatric:        Mood and Affect: Mood and affect normal.        Behavior: Behavior normal.     Assessment/Plan: Left hip fx -- Plan IMN tomorrow with Dr. Doreatha Martin. Please keep NPO after MN. 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 Please give vitamin K if INR>2, will recheck in AM.    Lisette Abu, PA-C Orthopedic Surgery [phone] 04/22/2020, 4:18 PM

## 2020-04-22 NOTE — ED Notes (Signed)
Pt back from imaging

## 2020-04-22 NOTE — ED Provider Notes (Signed)
[facility] EMERGENCY DEPARTMENT Provider Note   CSN: [id number] Arrival date & time: 04/22/20  1514     History Chief Complaint  Patient presents with  . Fall    [name] is a 87 y.o. female past medical history of A. fib on Coumadin, hypertension, osteoporosis, presenting to the emergency department from home with left hip injury.  Patient fell out of her wheelchair today while she was exercising onto her left hip.  No head trauma or LOC.  Her pain is localized to the left hip.  She states she feels some soreness in her left upper arm she thinks from laying on her arm while waiting for EMS to arrive.  Reports bilateral knee replacements surgeries.  Denies midline neck or back pain, chest or abdominal pain, or other injuries.  Does note recent COVID 19 infection during the recent Christmas holiday. She tested positive by rapid test at her PCP.  The history is provided by the patient and the spouse.       Past Medical History:  Diagnosis Date  . Arthritis    "fingers, hips, feet, ankles" (11/10/2012)  . Asthma   . Atrial fibrillation (HCC)    CHRONIC COUMADIN  . Breast cancer ([place]) 1990's   "cancer on one side; precancerous tissue on the other" (11/10/2012)  . Chronic bronchitis ([place])    "multiple times; not in a long time" (11/10/2012  . Depression   . GERD (gastroesophageal reflux disease)   . Hearing impaired   . Hypertension   . Irritable bowel   . Lymphedema    HX OF - IN LEFT ARM--NO NEEDLES OR B/P'S LEFT ARM  . Macular degeneration    "BEGINNINGS OF" MACULAR DEGENERATION  . Osteoporosis   . Pneumonia    "multiple times; not in a long time" (11/10/2012)  . Recurrent UTI   . Sleep apnea    "dx'd w/it; don't wear mask or anything" (11/10/2012)  . Stroke ([place]) ~ 2005   HX OF TIA-NO RESIDUAL PROBLEM--PARALYSIS RT SIDE FACE /PT'S MOUTH DROOPS-AND LOSS OF HEARING RT EAR --SINCE EAR SURGERY AS A CHILD   . UTI (lower urinary tract infection)     FREQUENT    Patient Active Problem List   Diagnosis Date Noted  . Closed left hip fracture ([place]) 04/22/2020  . Pain due to onychomycosis of toenails of both feet 08/15/2019  . Lightheadedness 08/06/2017  . Femur fracture, left ([place]) 08/06/2017  . Dehydration 08/06/2017  . Acute lower UTI 08/06/2017  . Periprosthetic fracture of shaft of femur   . Anemia 11/10/2012    Class: Chronic  . Syncope 11/09/2012  . Urinary tract infection, recurrent 11/09/2012  . Hyponatremia 11/09/2012  . Diarrhea 11/09/2012  . Paroxysmal atrial fibrillation ([place]) 11/09/2012  . Hypertension 11/09/2012  . Synovitis of knee 04/07/2012  . Postop Hypokalemia 09/23/2011  . Postop Acute blood loss anemia 09/21/2011  . Postop Transfusion 09/21/2011  . OA (osteoarthritis) of knee 09/20/2011    Past Surgical History:  Procedure Laterality Date  . BREAST BIOPSY Bilateral 1990's  . Dacryocystorhinostomy  02/18/2016  . INNER EAR SURGERY Right    MULTIPLE EAR SURGERIES,  . JOINT REPLACEMENT    . KNEE ARTHROSCOPY  04/07/2012   Procedure: ARTHROSCOPY KNEE;  Surgeon: Gearlean Alf, MD;  Location: [facility];  Service: Orthopedics;  Laterality: Left;  WITH SYNOVECTOMY  . MASTECTOMY Bilateral 1990's  . MASTOIDECTOMY Right 1942  . ORIF FEMUR FRACTURE Left 08/08/2017   Procedure:  OPEN REDUCTION INTERNAL FIXATION (ORIF) DISTAL FEMUR FRACTURE;  Surgeon: Shona Needles, MD;  Location: [facility];  Service: Orthopedics;  Laterality: Left;  . TONSILLECTOMY    . TOTAL KNEE ARTHROPLASTY  09/20/2011   LEFT TOTAL KNEE ARTHROPLASTY;  Surgeon: Gearlean Alf, MD;  Location: [facility];  Service: Orthopedics;  Laterality: Left;  . TOTAL KNEE ARTHROPLASTY  2006     OB History   No obstetric history on file.     Family History  Problem Relation Age of Onset  . CAD Mother   . Stroke Mother   . CAD Father     Social History   Tobacco Use  . Smoking status: Never Smoker  . Smokeless tobacco: Never Used   Vaping Use  . Vaping Use: Never used  Substance Use Topics  . Alcohol use: No  . Drug use: No    Home Medications Prior to Admission medications   Medication Sig Start Date End Date Taking? Authorizing Provider  atorvastatin (LIPITOR) 20 MG tablet Take 20 mg by mouth daily after supper.     [provider]  chlorthalidone (HYGROTON) 50 MG tablet Take 50 mg by mouth daily.     [provider]  estradiol (ESTRACE) 0.1 MG/GM vaginal cream Place 1 Applicatorful vaginally See admin instructions. Apply vaginally twice weekly    [provider]  loperamide (IMODIUM) 2 MG capsule Take 2-4 mg by mouth 5 (five) times daily as needed for diarrhea or loose stools.     [provider]  losartan (COZAAR) 100 MG tablet Take 100 mg by mouth daily.    [provider]  metoprolol succinate (TOPROL-XL) 25 MG 24 hr tablet Take 1 tablet (25 mg total) by mouth daily. 08/28/18   Jerline Pain, MD  Multiple Vitamin (MULTIVITAMIN WITH MINERALS) TABS tablet Take 1 tablet by mouth daily.    [provider]  Multiple Vitamins-Minerals (PRESERVISION/LUTEIN) CAPS Take 1 capsule by mouth daily.     [provider]  pantoprazole (PROTONIX) 40 MG tablet Take 80 mg by mouth daily.     [provider]  Polyethyl Glycol-Propyl Glycol (SYSTANE) 0.4-0.3 % GEL ophthalmic gel Place 1 application into both eyes See admin instructions. Apply to both eyes every other night at bedtime (alternate with drops)    [provider]  Probiotic Product (VSL#3 PO) Take 1 capsule by mouth daily.     [provider]  saccharomyces boulardii (FLORASTOR) 250 MG capsule Take 250-500 mg by mouth See admin instructions. Take one tablet (250 mg) by mouth every morning and two tablets (500 mg) at night    [provider]  warfarin (COUMADIN) 5 MG tablet Take 5 mg by mouth daily with supper. 5mg  by mouth daily, except 7.5mg  on Saturdays 09/23/11   Dara Lords,  Alexzandrew L, PA-C    Allergies    Sulfamethoxazole, Ampicillin, Clindamycin/lincomycin, Latex, Other, and Pradaxa [dabigatran etexilate mesylate]  Review of Systems   Review of Systems  HENT:       No facial trauma  Eyes: Negative for visual disturbance.  Respiratory: Negative for shortness of breath.   Cardiovascular: Negative for chest pain.  Gastrointestinal: Negative for abdominal pain.  Musculoskeletal: Positive for arthralgias. Negative for back pain.  Skin: Negative for wound.  Neurological: Negative for syncope and headaches.  Hematological: Bruises/bleeds easily.  Psychiatric/Behavioral: Negative for confusion.    Physical Exam Updated Vital Signs BP (!) 156/71   Pulse 72   Temp 97.8 F (36.6 C) (  Oral)   Resp 20   SpO2 98%   Physical Exam Vitals and nursing note reviewed.  Constitutional:      Appearance: She is well-developed and well-nourished.  HENT:     Head: Normocephalic and atraumatic.  Eyes:     Extraocular Movements: Extraocular movements intact.     Conjunctiva/sclera: Conjunctivae normal.  Cardiovascular:     Rate and Rhythm: Normal rate.  Pulmonary:     Effort: Pulmonary effort is normal. No respiratory distress.     Breath sounds: Normal breath sounds.  Abdominal:     General: Bowel sounds are normal.     Palpations: Abdomen is soft.     Tenderness: There is no abdominal tenderness.  Musculoskeletal:     Comments: Left leg with external rotation and shortening deformity.  Did not range the hip secondary to injury.  No tenderness or deformity noted to the left knee.  Reports some tenderness to the left lower leg which is chronic in nature and not new.  No deformity or swelling noted.  Right leg appears atraumatic and is nontender. Left upper arm with some tenderness.  No deformity.  Skin:    General: Skin is warm.  Neurological:     Mental Status: She is alert.     Comments: Facial asymmetry is noted with paralysis to the right face  (chronic)  Psychiatric:        Mood and Affect: Mood and affect normal.        Behavior: Behavior normal.     ED Results / Procedures / Treatments   Labs (all labs ordered are listed, but only abnormal results are displayed) Labs Reviewed  CBC WITH DIFFERENTIAL/PLATELET - Abnormal; Notable for the following components:      Result Value   WBC 11.7 (*)    RBC 3.48 (*)    Hemoglobin 10.1 (*)    HCT 30.8 (*)    Neutro Abs 10.4 (*)    All other components within normal limits  BASIC METABOLIC PANEL - Abnormal; Notable for the following components:   Glucose, Bld 165 (*)    BUN 30 (*)    Creatinine, Ser 1.46 (*)    GFR, Estimated 35 (*)    All other components within normal limits  PROTIME-INR - Abnormal; Notable for the following components:   Prothrombin Time 24.6 (*)    INR 2.3 (*)    All other components within normal limits  SARS CORONAVIRUS 2 (TAT 6-24 HRS)  PROTIME-INR  CBC WITH DIFFERENTIAL/PLATELET  MAGNESIUM  BASIC METABOLIC PANEL  URINALYSIS, ROUTINE W REFLEX MICROSCOPIC  TYPE AND SCREEN    EKG None  Radiology DG Chest 1 View  Result Date: 04/22/2020 CLINICAL DATA:  Preoperative evaluation EXAM: CHEST  1 VIEW COMPARISON:  08/06/2017 FINDINGS: Chronic interstitial prominence with scarring at the lung bases. Possible mild superimposed interstitial edema. Mild cardiomegaly. No pleural effusion or pneumothorax. IMPRESSION: Chronic interstitial prominence with possible superimposed mild interstitial edema. Mild cardiomegaly. Electronically Signed   By: Macy Mis M.D.   On: 04/22/2020 16:09   DG Hip Unilat With Pelvis 2-3 Views Left  Result Date: 04/22/2020 CLINICAL DATA:  Fall, pain EXAM: DG HIP (WITH OR WITHOUT PELVIS) 2-3V LEFT COMPARISON:  08/08/2017 FINDINGS: Osteopenia. Mildly displaced and impacted intratrochanteric fractures of the proximal left femur. The pelvis and proximal right femur appear intact in frontal view only. Partially imaged plate and screw  fixation of the femoral diaphysis. IMPRESSION: 1. Mildly displaced and impacted intratrochanteric fractures of the  proximal left femur. 2. The pelvis and proximal right femur appear intact in frontal view only. Electronically Signed   By: Eddie Candle M.D.   On: 04/22/2020 16:23    Procedures Procedures   Medications Ordered in ED Medications  fentaNYL (SUBLIMAZE) injection 25 mcg (has no administration in time range)  atorvastatin (LIPITOR) tablet 20 mg (has no administration in time range)  metoprolol succinate (TOPROL-XL) 24 hr tablet 25 mg (has no administration in time range)  pantoprazole (PROTONIX) injection 40 mg (has no administration in time range)  acetaminophen (TYLENOL) tablet 650 mg (has no administration in time range)    Or  acetaminophen (TYLENOL) suppository 650 mg (has no administration in time range)  fentaNYL (SUBLIMAZE) injection 25 mcg (has no administration in time range)  naloxone (NARCAN) injection 0.4 mg (has no administration in time range)  phytonadione (VITAMIN K) 5 mg in dextrose 5 % 50 mL IVPB (has no administration in time range)  fentaNYL (SUBLIMAZE) injection 100 mcg (100 mcg Intravenous Given 04/22/20 1655)  acetaminophen (TYLENOL) tablet 1,000 mg (1,000 mg Oral Given 04/22/20 1657)    ED Course  I have reviewed the triage vital signs and the nursing notes.  Pertinent labs & imaging results that were available during my care of the patient were reviewed by me and considered in my medical decision making (see chart for details).  Clinical Course as of 04/22/20 1807  Tue Apr 22, 2020  1640 [facility] [place] [facility] Odor evaluated patient at bedside.  Dr. Doreatha Martin with orthopedics to operate in morning [JR]  1700 Dr. Nadara Mustard accepting admission. [JR]    Clinical Course User Index [JR] Mujahid Jalomo, [place] N, PA-C   MDM Rules/Calculators/A&P                          Patient presenting to the emergency department from home with closed left intertrochanteric  fractures after falling out of her wheelchair. No head trauma. Is on coumadin for a fib. Some pain to left upper arm though patient thinks it is soreness from laying on it while awaiting EMS. Xray pending.  Ortho to operate in the morning, Dr. Doreatha Martin. NPO at midnight. Hold coumadin, vit K ordered per ortho for coumadin reversal. Patient admitted to medicine service.  The patient appears reasonably stabilized for admission considering the current resources, flow, and capabilities available in the ED at this time, and I doubt any other [facility] requiring further screening and/or treatment in the ED prior to admission.   Final Clinical Impression(s) / ED Diagnoses Final diagnoses:  Intertrochanteric fracture of left femur, closed, initial encounter [facility])  Fall in home, initial encounter    Rx / DC Orders ED Discharge Orders    None       Jayln Madeira, [place] N, PA-C 04/22/20 1807    Charlesetta Shanks, MD 04/23/20 [phone]

## 2020-04-22 NOTE — ED Triage Notes (Signed)
Pt fell out of wheelchair at home. Complains of left hip pain. Left lower leg shortened and externally rotated. Good palpable pedal pulse. Did not hit head. Bp 149/80, HR 75, CBG 166. Pt is alert and oriented.

## 2020-04-22 NOTE — ED Notes (Addendum)
Patient assisted with bed pan use, passed a small amount of stool, patient cleansed. And assisted with repositioning. Patient reports relief of Lt hip pain with current position.

## 2020-04-22 NOTE — Consult Note (Cosign Needed)
Reason for Consult:Left hip fx Referring Physician: Willette Cluster Time called: [id number] Time at bedside: [name] is an 87 y.o. female.  HPI: [name] was at home today and trying to put down an exercise wheel when she felt herself slipping out of her WC. She was unable to stop herself and fell onto her left side. She had immediate hip and back pain and could not get up. She was brought to the ED where x-rays showed a left hip fx and orthopedic surgery was consulted. She lives at home with her husband and ambulates with a RW.  Past Medical History:  Diagnosis Date  . Arthritis    "fingers, hips, feet, ankles" (11/10/2012)  . Asthma   . Atrial fibrillation (HCC)    CHRONIC COUMADIN  . Breast cancer ([place]) 1990's   "cancer on one side; precancerous tissue on the other" (11/10/2012)  . Chronic bronchitis ([place])    "multiple times; not in a long time" (11/10/2012  . Depression   . GERD (gastroesophageal reflux disease)   . Hearing impaired   . Hypertension   . Irritable bowel   . Lymphedema    HX OF - IN LEFT ARM--NO NEEDLES OR B/P'S LEFT ARM  . Macular degeneration    "BEGINNINGS OF" MACULAR DEGENERATION  . Osteoporosis   . Pneumonia    "multiple times; not in a long time" (11/10/2012)  . Recurrent UTI   . Sleep apnea    "dx'd w/it; don't wear mask or anything" (11/10/2012)  . Stroke ([place]) ~ 2005   HX OF TIA-NO RESIDUAL PROBLEM--PARALYSIS RT SIDE FACE /PT'S MOUTH DROOPS-AND LOSS OF HEARING RT EAR --SINCE EAR SURGERY AS A CHILD   . UTI (lower urinary tract infection)    FREQUENT    Past Surgical History:  Procedure Laterality Date  . BREAST BIOPSY Bilateral 1990's  . Dacryocystorhinostomy  02/18/2016  . INNER EAR SURGERY Right    MULTIPLE EAR SURGERIES,  . JOINT REPLACEMENT    . KNEE ARTHROSCOPY  04/07/2012   Procedure: ARTHROSCOPY KNEE;  Surgeon: Gearlean Alf, MD;  Location: [facility] ([facility]  Service: Orthopedics;  Laterality: Left;  WITH SYNOVECTOMY  . MASTECTOMY  Bilateral 1990's  . MASTOIDECTOMY Right 1942  . ORIF FEMUR FRACTURE Left 08/08/2017   Procedure: OPEN REDUCTION INTERNAL FIXATION (ORIF) DISTAL FEMUR FRACTURE;  Surgeon: Shona Needles, MD;  Location: [place];  Service: Orthopedics;  Laterality: Left;  . TONSILLECTOMY    . TOTAL KNEE ARTHROPLASTY  09/20/2011   LEFT TOTAL KNEE ARTHROPLASTY;  Surgeon: Gearlean Alf, MD;  Location: [facility];  Service: Orthopedics;  Laterality: Left;  . TOTAL KNEE ARTHROPLASTY  2006    Family History  Problem Relation Age of Onset  . CAD Mother   . Stroke Mother   . CAD Father     Social History:  reports that she has never smoked. She has never used smokeless tobacco. She reports that she does not drink alcohol and does not use drugs.  Allergies:  Allergies  Allergen Reactions  . Sulfamethoxazole Diarrhea  . Ampicillin Other (See Comments)    C-DIF AFTER TAKING AMPICILLIN    Has patient had a PCN reaction causing immediate rash, facial/tongue/throat swelling, SOB or lightheadedness with hypotension: No Has patient had a PCN reaction causing severe rash involving mucus membranes or skin necrosis: No Has patient had a PCN reaction that required hospitalization: No Has patient had a PCN reaction occurring within the last 10 years: No If  all of the above answers are "NO", then may proceed with Cephalosporin use.  . Clindamycin/Lincomycin Other (See Comments)    PT STATES HER DOCTOR TOLD HER NOT TO TAKE CLINDAMYCIN BECAUSE SHE GOT C-DIFF AFTER TAKING AMPICILLIN  . Latex Other (See Comments)    Blisters   . Other Other (See Comments)    PT STATES HER DOCTOR TOLD HER NOT TO TAKE CLINDAMYCIN BECAUSE SHE GOT C-DIFF AFTER TAKING AMPICILLIN  . Pradaxa [Dabigatran Etexilate Mesylate] Other (See Comments)    INTERNAL BLEEDING    Medications: I have reviewed the patient's current medications.  No results found for this or any previous visit (from the past 48 hour(s)).  DG Chest 1 View  Result Date:  04/22/2020 CLINICAL DATA:  Preoperative evaluation EXAM: CHEST  1 VIEW COMPARISON:  08/06/2017 FINDINGS: Chronic interstitial prominence with scarring at the lung bases. Possible mild superimposed interstitial edema. Mild cardiomegaly. No pleural effusion or pneumothorax. IMPRESSION: Chronic interstitial prominence with possible superimposed mild interstitial edema. Mild cardiomegaly. Electronically Signed   By: Macy Mis M.D.   On: 04/22/2020 16:09    Review of Systems  HENT: Negative for ear discharge, ear pain, hearing loss and tinnitus.   Eyes: Negative for photophobia and pain.  Respiratory: Negative for cough and shortness of breath.   Cardiovascular: Negative for chest pain.  Gastrointestinal: Negative for abdominal pain, nausea and vomiting.  Genitourinary: Negative for dysuria, flank pain, frequency and urgency.  Musculoskeletal: Positive for arthralgias (Left hip/back). Negative for back pain, myalgias and neck pain.  Neurological: Negative for dizziness and headaches.  Hematological: Does not bruise/bleed easily.  Psychiatric/Behavioral: The patient is not nervous/anxious.    Blood pressure (!) 156/71, pulse 72, temperature 97.8 F (36.6 C), temperature source Oral, resp. rate 20, SpO2 98 %. Physical Exam Constitutional:      General: She is not in acute distress.    Appearance: She is well-developed and well-nourished. She is not diaphoretic.  HENT:     Head: Normocephalic and atraumatic.  Eyes:     General: No scleral icterus.       Right eye: No discharge.        Left eye: No discharge.     Conjunctiva/sclera: Conjunctivae normal.  Cardiovascular:     Rate and Rhythm: Normal rate and regular rhythm.  Pulmonary:     Effort: Pulmonary effort is normal. No respiratory distress.  Musculoskeletal:     Cervical back: Normal range of motion.     Comments: LLE No traumatic wounds, ecchymosis, or rash  Mod TTP hip  No knee or ankle effusion  Knee stable to varus/ valgus  and anterior/posterior stress  Sens DPN, SPN, TN intact  Motor EHL, ext, flex, evers 5/5  DP 2+, PT 1+, No significant edema  Skin:    General: Skin is warm and dry.  Neurological:     Mental Status: She is alert.  Psychiatric:        Mood and Affect: Mood and affect normal.        Behavior: Behavior normal.     Assessment/Plan: Left hip fx -- Plan IMN tomorrow with Dr. Doreatha Martin. Please keep NPO after MN. 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 Please give vitamin K if INR>2, will recheck in AM.    Lisette Abu, PA-C Orthopedic Surgery [phone] 04/22/2020, 4:18 PM

## 2020-04-22 NOTE — H&P (Signed)
History and Physical    PLEASE NOTE THAT DRAGON DICTATION SOFTWARE WAS USED IN THE CONSTRUCTION OF THIS NOTE.   [name] [id number] DOB: 25-Jul-1933 DOA: 04/22/2020  PCP: Leanna Battles, MD Patient coming from: home   I have personally briefly reviewed patient's old medical records in [place]  Chief Complaint: left hip pain  HPI: [name] is a 87 y.o. female with medical history significant for paroxysmal atrial fibrillation chronically anticoagulated on warfarin, recurrent urinary tract infection, hypertension, hyperlipidemia, osteoarthritis status post bilateral knee replacements, who is admitted to [facility] on 04/22/2020 with acute closed left intratrochanteric hip fracture after presenting from home to [facility] Emergency Department complaining of left hip pain.   While attempting to perform her left lower extremity strengthening exercises today, the patient reports that fell forward out of her wheel chair with her left hip serving as the principal point of contact  With the floor below.  She reports immediate development of sharp left hip pain with radiation to the left groin.  She reports that the discomfort has been constant since onset, and worsens with movement of the left lower extremity.  Denies any associated numbness or paresthesias involving the left lower extremity.  This was reportedly a witnessed fall, and the patient denies any associated loss of consciousness, and she conveys that she did not hit her head as a component of this fall.  Denies any preceding or associated chest pain, shortness of breath, diaphoresis, palpitations, nausea, vomiting, dizziness, presyncope, or syncope.  Denies any subsequent headache, blurry vision, or diplopia.  In the setting of paroxysmal atrial fibrillation, the patient confirms that she is chronically anticoagulated on warfarin, with most recent dose occurring on the morning of 04-22-2020.  Otherwise, she denies use of  any blood thinning agents as an outpatient, including no aspirin.  Denies any history of chronic heart failure coronary artery disease.  Denies any recent sob,orthopnea, PND, or lower extremity edema.  She also denies any recent chest pain, palpitations, diaphoresis.   Denies any recent subjective fever, chills, rigors, or generalized myalgias. Denies any recent neck stiffness, rhinitis, rhinorrhea, sore throat, wheezing, cough, nausea, vomiting, abdominal pain, diarrhea, or rash. Denies dysuria, gross hematuria, or change in urinary urgency/frequency.   She reports that she was tested positive for COVID-19 as an outpatient during the final week of December 2020. She reports complete ensuing resolution of her respiratory symptoms, without any subsequent recurrence.  Denies any subsequent known COVID-19 exposures.     ED Course:  Vital signs in the ED were notable for the following: Temperature max 97.8, heart rate 72, blood pressure 156/71, respiratory 20, and oxygen saturation 98% on room air.  Labs were notable for the following: BMP was ordered, with result currently pending.  INR in the setting of home warfarin was found to be 2.3.  CBC notable for white blood cell count of 11,700.   COVID-19 screen was performed in the ED today, with result currently pending.  Chest x-ray shows chronic interstitial prominence with evidence of bibasilar scarring with possible mild superimposed interstitial edema, but otherwise no evidence of acute cardiopulmonary process, including no evidence of pleural effusion, infiltrate, or pneumothorax.  Plain films of the left hip showed mildly displaced and impacted intratrochanteric fracture of the proximal left femur.  The patient's case and imaging were discussed with the on-call orthopedic surgeon, Dr. Doreatha Martin, who agrees to formally consult and plans to take the patient to the OR tomorrow morning for definitive surgical intervention.  In the setting of chronic  anticoagulation on warfarin, Dr. Doreatha Martin requests that vitamin K be administered if ensuing INR result reflects value greater than 2.0, with plan to repeat INR in the morning.  He also request the patient be made n.p.o. after midnight.  While in the ED, the following were administered: Fentanyl 100 mcg IV x1 and Tylenol 1 g p.o. x1.    Review of Systems: As per HPI otherwise 10 point review of systems negative.   Past Medical History:  Diagnosis Date  . Arthritis    "fingers, hips, feet, ankles" (11/10/2012)  . Asthma   . Atrial fibrillation (HCC)    CHRONIC COUMADIN  . Breast cancer ([place]) 1990's   "cancer on one side; precancerous tissue on the other" (11/10/2012)  . Chronic bronchitis ([place])    "multiple times; not in a long time" (11/10/2012  . Depression   . GERD (gastroesophageal reflux disease)   . Hearing impaired   . Hypertension   . Irritable bowel   . Lymphedema    HX OF - IN LEFT ARM--NO NEEDLES OR B/P'S LEFT ARM  . Macular degeneration    "BEGINNINGS OF" MACULAR DEGENERATION  . Osteoporosis   . Pneumonia    "multiple times; not in a long time" (11/10/2012)  . Recurrent UTI   . Sleep apnea    "dx'd w/it; don't wear mask or anything" (11/10/2012)  . Stroke ([place]) ~ 2005   HX OF TIA-NO RESIDUAL PROBLEM--PARALYSIS RT SIDE FACE /PT'S MOUTH DROOPS-AND LOSS OF HEARING RT EAR --SINCE EAR SURGERY AS A CHILD   . UTI (lower urinary tract infection)    FREQUENT    Past Surgical History:  Procedure Laterality Date  . BREAST BIOPSY Bilateral 1990's  . Dacryocystorhinostomy  02/18/2016  . INNER EAR SURGERY Right    MULTIPLE EAR SURGERIES,  . JOINT REPLACEMENT    . KNEE ARTHROSCOPY  04/07/2012   Procedure: ARTHROSCOPY KNEE;  Surgeon: Gearlean Alf, MD;  Location: [facility];  Service: Orthopedics;  Laterality: Left;  WITH SYNOVECTOMY  . MASTECTOMY Bilateral 1990's  . MASTOIDECTOMY Right 1942  . ORIF FEMUR FRACTURE Left 08/08/2017   Procedure: OPEN REDUCTION  INTERNAL FIXATION (ORIF) DISTAL FEMUR FRACTURE;  Surgeon: Shona Needles, MD;  Location: [facility];  Service: Orthopedics;  Laterality: Left;  . TONSILLECTOMY    . TOTAL KNEE ARTHROPLASTY  09/20/2011   LEFT TOTAL KNEE ARTHROPLASTY;  Surgeon: Gearlean Alf, MD;  Location: [facility];  Service: Orthopedics;  Laterality: Left;  . TOTAL KNEE ARTHROPLASTY  2006    Social History:  reports that she has never smoked. She has never used smokeless tobacco. She reports that she does not drink alcohol and does not use drugs.   Allergies  Allergen Reactions  . Sulfamethoxazole Diarrhea  . Ampicillin Other (See Comments)    C-DIF AFTER TAKING AMPICILLIN    Has patient had a PCN reaction causing immediate rash, facial/tongue/throat swelling, SOB or lightheadedness with hypotension: No Has patient had a PCN reaction causing severe rash involving mucus membranes or skin necrosis: No Has patient had a PCN reaction that required hospitalization: No Has patient had a PCN reaction occurring within the last 10 years: No If all of the above answers are "NO", then may proceed with Cephalosporin use.  . Clindamycin/Lincomycin Other (See Comments)    PT STATES HER DOCTOR TOLD HER NOT TO TAKE CLINDAMYCIN BECAUSE SHE GOT C-DIFF AFTER TAKING AMPICILLIN  . Latex Other (See Comments)  Blisters   . Other Other (See Comments)    PT STATES HER DOCTOR TOLD HER NOT TO TAKE CLINDAMYCIN BECAUSE SHE GOT C-DIFF AFTER TAKING AMPICILLIN  . Pradaxa [Dabigatran Etexilate Mesylate] Other (See Comments)    INTERNAL BLEEDING    Family History  Problem Relation Age of Onset  . CAD Mother   . Stroke Mother   . CAD Father     Prior to Admission medications   Medication Sig Start Date End Date Taking? Authorizing Provider  atorvastatin (LIPITOR) 20 MG tablet Take 20 mg by mouth daily after supper.     [provider]  chlorthalidone (HYGROTON) 50 MG tablet Take 50 mg by mouth daily.     [provider]   estradiol (ESTRACE) 0.1 MG/GM vaginal cream Place 1 Applicatorful vaginally See admin instructions. Apply vaginally twice weekly    [provider]  loperamide (IMODIUM) 2 MG capsule Take 2-4 mg by mouth 5 (five) times daily as needed for diarrhea or loose stools.     [provider]  losartan (COZAAR) 100 MG tablet Take 100 mg by mouth daily.    [provider]  metoprolol succinate (TOPROL-XL) 25 MG 24 hr tablet Take 1 tablet (25 mg total) by mouth daily. 08/28/18   Jerline Pain, MD  Multiple Vitamin (MULTIVITAMIN WITH MINERALS) TABS tablet Take 1 tablet by mouth daily.    [provider]  Multiple Vitamins-Minerals (PRESERVISION/LUTEIN) CAPS Take 1 capsule by mouth daily.     [provider]  pantoprazole (PROTONIX) 40 MG tablet Take 80 mg by mouth daily.     [provider]  Polyethyl Glycol-Propyl Glycol (SYSTANE) 0.4-0.3 % GEL ophthalmic gel Place 1 application into both eyes See admin instructions. Apply to both eyes every other night at bedtime (alternate with drops)    [provider]  Probiotic Product (VSL#3 PO) Take 1 capsule by mouth daily.     [provider]  saccharomyces boulardii (FLORASTOR) 250 MG capsule Take 250-500 mg by mouth See admin instructions. Take one tablet (250 mg) by mouth every morning and two tablets (500 mg) at night    [provider]  warfarin (COUMADIN) 5 MG tablet Take 5 mg by mouth daily with supper. 5mg  by mouth daily, except 7.5mg  on Saturdays 09/23/11   Dara Lords, Alexzandrew L, PA-C     Objective    Physical Exam: Vitals:   04/22/20 1514  BP: (!) 156/71  Pulse: 72  Resp: 20  Temp: 97.8 F (36.6 C)  TempSrc: Oral  SpO2: 98%    General: appears to be stated age; alert, oriented Skin: warm, dry, no rash Head:  AT/[place] Mouth:  Oral mucosa membranes appear moist, normal dentition Neck: supple; trachea midline Heart:  RRR; did not appreciate any M/R/G Lungs: CTAB,  did not appreciate any wheezes, rales, or rhonchi Abdomen: + BS; soft, ND, NT Vascular: 2+ pedal pulses b/l; 2+ radial pulses b/l Extremities: no peripheral edema, no muscle wasting; left lower extremity externally rotated with shortening relative to RLE. Neuro: sensation intact in upper and lower extremities b/l; assessment of strength of left lower extremity limited by current degree of pain control.   Labs on Admission: I have personally reviewed following labs and imaging studies  CBC: No results for input(s): WBC, NEUTROABS, HGB, HCT, MCV, PLT in the last 168 hours. Basic Metabolic Panel: No results for input(s): NA, K, CL, CO2, GLUCOSE, BUN, CREATININE, CALCIUM, MG, PHOS in the last 168 hours. GFR: CrCl cannot  be calculated (Patient's most recent lab result is older than the maximum 21 days allowed.). Liver Function Tests: No results for input(s): AST, ALT, ALKPHOS, BILITOT, PROT, ALBUMIN in the last 168 hours. No results for input(s): LIPASE, AMYLASE in the last 168 hours. No results for input(s): AMMONIA in the last 168 hours. Coagulation Profile: No results for input(s): INR, PROTIME in the last 168 hours. Cardiac Enzymes: No results for input(s): CKTOTAL, CKMB, CKMBINDEX, TROPONINI in the last 168 hours. BNP (last 3 results) No results for input(s): PROBNP in the last 8760 hours. HbA1C: No results for input(s): HGBA1C in the last 72 hours. CBG: No results for input(s): GLUCAP in the last 168 hours. Lipid Profile: No results for input(s): CHOL, HDL, LDLCALC, TRIG, CHOLHDL, LDLDIRECT in the last 72 hours. Thyroid Function Tests: No results for input(s): TSH, T4TOTAL, FREET4, T3FREE, THYROIDAB in the last 72 hours. Anemia Panel: No results for input(s): VITAMINB12, FOLATE, FERRITIN, TIBC, IRON, RETICCTPCT in the last 72 hours. Urine analysis:    Component Value Date/Time   COLORURINE YELLOW 08/07/2017 0431   APPEARANCEUR HAZY (A) 08/07/2017 0431   LABSPEC 1.012  08/07/2017 0431   PHURINE 9.0 (H) 08/07/2017 0431   GLUCOSEU NEGATIVE 08/07/2017 0431   HGBUR NEGATIVE 08/07/2017 0431   BILIRUBINUR NEGATIVE 08/07/2017 0431   KETONESUR NEGATIVE 08/07/2017 0431   PROTEINUR NEGATIVE 08/07/2017 0431   UROBILINOGEN 0.2 07/28/2014 1239   NITRITE POSITIVE (A) 08/07/2017 0431   LEUKOCYTESUR LARGE (A) 08/07/2017 0431    Radiological Exams on Admission: DG Chest 1 View  Result Date: 04/22/2020 CLINICAL DATA:  Preoperative evaluation EXAM: CHEST  1 VIEW COMPARISON:  08/06/2017 FINDINGS: Chronic interstitial prominence with scarring at the lung bases. Possible mild superimposed interstitial edema. Mild cardiomegaly. No pleural effusion or pneumothorax. IMPRESSION: Chronic interstitial prominence with possible superimposed mild interstitial edema. Mild cardiomegaly. Electronically Signed   By: Macy Mis M.D.   On: 04/22/2020 16:09   DG Hip Unilat With Pelvis 2-3 Views Left  Result Date: 04/22/2020 CLINICAL DATA:  Fall, pain EXAM: DG HIP (WITH OR WITHOUT PELVIS) 2-3V LEFT COMPARISON:  08/08/2017 FINDINGS: Osteopenia. Mildly displaced and impacted intratrochanteric fractures of the proximal left femur. The pelvis and proximal right femur appear intact in frontal view only. Partially imaged plate and screw fixation of the femoral diaphysis. IMPRESSION: 1. Mildly displaced and impacted intratrochanteric fractures of the proximal left femur. 2. The pelvis and proximal right femur appear intact in frontal view only. Electronically Signed   By: Eddie Candle M.D.   On: 04/22/2020 16:23      Assessment/Plan   Greysen Swanton is a 87 y.o. female with medical history significant for paroxysmal atrial fibrillation chronically anticoagulated on warfarin, recurrent urinary tract infection, hypertension, hyperlipidemia, osteoarthritis status post bilateral knee replacements, who is admitted to [facility] on 04/22/2020 with acute closed left intratrochanteric hip fracture  after presenting from home to [facility] Emergency Department complaining of left hip pain.    Principal Problem:   Closed left hip fracture (HCC) Active Problems:   Paroxysmal atrial fibrillation (HCC)   Hypertension   Left hip pain   Leukocytosis   HLD (hyperlipidemia)    #) Acute closed left intratrochanteric hip fracture: Confirmed via plain films of the left hip today and standing from a mechanical level fall without associated loss of conscious that occurred earlier on the day of admission, as further described above.   The patient's case and imaging were discussed with the on-call orthopedic surgeon, Dr. Doreatha Martin, who  agrees to formally consult and plans to take the patient to the OR tomorrow morning for definitive surgical intervention. Additionally, he requests that vitamin K be administered for INR result reflects value greater than 2.0, with plan to repeat INR in the morning, and asked that patient be made n.p.o. after midnight.  In the context of chronic anticoagulation on warfarin in the setting of history of paroxysmal atrial fibrillation, presenting INR found to be 2.3.  Per request from orthopedic surgery, will administer vitamin K, as below, with repeat INR ordered for the morning.  Otherwise, patient on no blood thinning agents as an outpatient, including no aspirin.  Left lower extremity semineurovascularly intact, the patient reports adequate pain control at this time.    Gupta Score for this patient in the context of anticipated aforementioned orthopedic surgery conveys a 1.53% perioperative risk for significant cardiac event. No clinical evidence to suggest acutely decompensated heart failure or acute MI. Consequently, no absolute contraindications to proceeding with proposed orthopedic surgery at this time.   Plan: Formal orthopedic surgery consult for definitive surgical management, with Dr. Doreatha Martin anticipating patient to be taken to the OR tomorrow morning for definitive surgical  intervention.  N.p.o. after midnight in anticipation of this procedure.  Hold home warfarin administer vitamin K 5 mg IV x1, as above.  Repeat INR in the morning.  SCDs.  As needed IV fentanyl. Anticipate postoperative PT consult. Pre-op EKG has been ordered. Check 25-hydroxy vit D level.     #) Ground level mechanical fall: The patient reports a mechanical fall in which she feel out of her wheel chair earlier today.  Did not hit head as a component of this fall, and no evidence of acute neurologic deficits at this time.  While this appears to be purely mechanical in nature, will also check urinalysis to rule out any underlying infectious contribution, particularly in the setting of a documented history of recurrent urinary tract infections.   Plan: Check urinalysis, as above.    Following for result of BMP. Repeat CBC with differential in the morning.       #) Leukocytosis: Presenting CBC reflects mildly elevated white blood cell count of 11,700.  Suspect that this is reactive in nature in the setting of presenting ground-level mechanical fall along with acute left hip fracture, as opposed to representing underlying infectious process. CXR without evidence of acute infiltrate.  Result of screening COVID-19 test performed in the ED this evening is currently pending.  Will check urinalysis to further evaluate for any underlying infectious process, as above.   Plan: Check urinalysis.  Repeat CBC with differential in the morning.      #) Paroxysmal atrial fibrillation: Documented history of such. In the setting of a CHA2DS2-VASc score of 6, there is an indication for the patient to be on chronic anticoagulation for thromboembolic prophylaxis. Consistent with this, the patient is chronically anticoagulated on warfarin, with presenting INR noted to be therapeutic at 2.3. Home AV nodal blocking regimen: Toprol-XL.  In the setting of presenting acute left hip fracture, with plan for the patient to be  taken to the OR for definitive surgical intervention tomorrow morning, will provide vitamin K per the request of orthopedic surgical consult, as further described above.   Plan: monitor strict I's & O's and daily weights.  Follow for result of BMP repeat BMP in the morning. Check serum magnesium level.  Vitamin K 5 mg IV x1, as above.  Repeat INR in the morning.  Monitor on  telemetry.  Resume beta-blocker postoperative for associated mortality benefit.       #) Essential hypertension: Outpatient antihypertensive regimen includes the following: Chlorthalidone, losartan, Toprol-XL.  Presenting systolic blood pressure noted to be in the 150s, with suspected contribution from presenting left hip pain, as above.  Plan: We will hold home chlorthalidone losartan for now in the setting of n.p.o. status and progression for orthopedic surgery, as above.  Have ordered resumption of home Toprol-XL to occur postoperatively for associated mortality benefit.  Close monitoring of ensuing blood pressures via routine vital signs.     #) Hyperlipidemia: On atorvastatin as an outpatient.  Plan: Continue statin.     #)GERD: Protonix as an outpatient.  Plan: In the setting of ensuing n.p.o. status, I have ordered IV Protonix until resumption of diet occurs postoperatively.      DVT prophylaxis: scd's  Code Status: Full code Family Communication: none Disposition Plan: Per Rounding Team Consults called: Case and imaging were discussed with on-call orthopedic surgeon, Dr. Doreatha Martin, as further described above. Admission status: Inpatient; MedSurg.    Of note, this patient was added by me to the following Admit List/Treatment Team:  [email or web address]     PLEASE NOTE THAT DRAGON DICTATION SOFTWARE WAS USED IN THE CONSTRUCTION OF THIS NOTE.   [facility] Pager [phone] From 12PM- 8PM  Otherwise, please contact night-coverage  [email or web address] Password [facility]  04/22/2020,  5:03 PM

## 2020-04-23 ENCOUNTER — Encounter (HOSPITAL_COMMUNITY): Payer: Self-pay | Admitting: Anesthesiology

## 2020-04-23 ENCOUNTER — Encounter (HOSPITAL_COMMUNITY)
Admission: EM | Disposition: A | Discharge status: Rehabilitation Facility | Payer: Self-pay | Source: Home / Self Care | Attending: Internal Medicine

## 2020-04-23 ENCOUNTER — Inpatient Hospital Stay (HOSPITAL_COMMUNITY): Payer: Medicare Other | Admitting: Anesthesiology

## 2020-04-23 ENCOUNTER — Inpatient Hospital Stay (HOSPITAL_COMMUNITY): Payer: Medicare Other

## 2020-04-23 ENCOUNTER — Encounter (HOSPITAL_COMMUNITY): Payer: Self-pay | Admitting: Internal Medicine

## 2020-04-23 DIAGNOSIS — M25552 Pain in left hip: Secondary | ICD-10-CM | POA: Diagnosis not present

## 2020-04-23 DIAGNOSIS — D72829 Elevated white blood cell count, unspecified: Secondary | ICD-10-CM | POA: Diagnosis not present

## 2020-04-23 DIAGNOSIS — S72002A Fracture of unspecified part of neck of left femur, initial encounter for closed fracture: Secondary | ICD-10-CM | POA: Diagnosis not present

## 2020-04-23 HISTORY — PX: INTRAMEDULLARY (IM) NAIL INTERTROCHANTERIC: SHX5875

## 2020-04-23 LAB — SURGICAL PCR SCREEN
MRSA, PCR: NEGATIVE
Staphylococcus aureus: NEGATIVE

## 2020-04-23 LAB — BASIC METABOLIC PANEL
Anion gap: 11 (ref 5–15)
BUN: 26 mg/dL — ABNORMAL HIGH (ref 8–23)
CO2: 21 mmol/L — ABNORMAL LOW (ref 22–32)
Calcium: 8.7 mg/dL — ABNORMAL LOW (ref 8.9–10.3)
Chloride: 103 mmol/L (ref 98–111)
Creatinine, Ser: 1.17 mg/dL — ABNORMAL HIGH (ref 0.44–1.00)
GFR, Estimated: 46 mL/min — ABNORMAL LOW (ref >60)
Glucose, Bld: 130 mg/dL — ABNORMAL HIGH (ref 70–99)
Potassium: 3.8 mmol/L (ref 3.5–5.1)
Sodium: 135 mmol/L (ref 135–145)

## 2020-04-23 LAB — CBC WITH DIFFERENTIAL/PLATELET
Abs Immature Granulocytes: 0.03 10*3/uL (ref 0.00–0.07)
Basophils Absolute: 0 10*3/uL (ref 0.0–0.1)
Basophils Relative: 0 %
Eosinophils Absolute: 0 10*3/uL (ref 0.0–0.5)
Eosinophils Relative: 0 %
HCT: 28.3 % — ABNORMAL LOW (ref 36.0–46.0)
Hemoglobin: 8.9 g/dL — ABNORMAL LOW (ref 12.0–15.0)
Immature Granulocytes: 0 %
Lymphocytes Relative: 13 %
Lymphs Abs: 1.2 10*3/uL (ref 0.7–4.0)
MCH: 27.6 pg (ref 26.0–34.0)
MCHC: 31.4 g/dL (ref 30.0–36.0)
MCV: 87.9 fL (ref 80.0–100.0)
Monocytes Absolute: 0.8 10*3/uL (ref 0.1–1.0)
Monocytes Relative: 9 %
Neutro Abs: 7.1 10*3/uL (ref 1.7–7.7)
Neutrophils Relative %: 78 %
Platelets: 246 10*3/uL (ref 150–400)
RBC: 3.22 MIL/uL — ABNORMAL LOW (ref 3.87–5.11)
RDW: 13.7 % (ref 11.5–15.5)
WBC: 9.2 10*3/uL (ref 4.0–10.5)
nRBC: 0 % (ref 0.0–0.2)

## 2020-04-23 LAB — CBG MONITORING, ED: Glucose-Capillary: 145 mg/dL — ABNORMAL HIGH (ref 70–99)

## 2020-04-23 LAB — URINALYSIS, ROUTINE W REFLEX MICROSCOPIC
Bilirubin Urine: NEGATIVE
Glucose, UA: NEGATIVE mg/dL
Hgb urine dipstick: NEGATIVE
Ketones, ur: NEGATIVE mg/dL
Nitrite: NEGATIVE
Protein, ur: NEGATIVE mg/dL
Specific Gravity, Urine: 1.017 (ref 1.005–1.030)
pH: 6 (ref 5.0–8.0)

## 2020-04-23 LAB — VITAMIN D 25 HYDROXY (VIT D DEFICIENCY, FRACTURES): Vit D, 25-Hydroxy: 26.9 ng/mL — ABNORMAL LOW (ref 30–100)

## 2020-04-23 LAB — MAGNESIUM: Magnesium: 1.7 mg/dL (ref 1.7–2.4)

## 2020-04-23 LAB — PROTIME-INR
INR: 1.5 — ABNORMAL HIGH (ref 0.8–1.2)
Prothrombin Time: 17.6 seconds — ABNORMAL HIGH (ref 11.4–15.2)

## 2020-04-23 SURGERY — FIXATION, FRACTURE, INTERTROCHANTERIC, WITH INTRAMEDULLARY ROD
Anesthesia: Choice | Laterality: Left

## 2020-04-23 SURGERY — FIXATION, FRACTURE, INTERTROCHANTERIC, WITH INTRAMEDULLARY ROD
Anesthesia: General | Laterality: Left

## 2020-04-23 MED ORDER — SUGAMMADEX SODIUM 200 MG/2ML IV SOLN
INTRAVENOUS | Status: DC | PRN
  Administered 2020-04-23 (×2): 100 mg via INTRAVENOUS

## 2020-04-23 MED ORDER — PHENYLEPHRINE 40 MCG/ML (10ML) SYRINGE FOR IV PUSH (FOR BLOOD PRESSURE SUPPORT)
PREFILLED_SYRINGE | INTRAVENOUS | Status: AC
  Filled 2020-04-23: qty 10

## 2020-04-23 MED ORDER — FENTANYL CITRATE (PF) 100 MCG/2ML IJ SOLN
INTRAMUSCULAR | Status: AC
  Filled 2020-04-23: qty 2

## 2020-04-23 MED ORDER — POTASSIUM CHLORIDE IN NACL 20-0.9 MEQ/L-% IV SOLN
INTRAVENOUS | Status: DC
  Filled 2020-04-23 (×6): qty 1000

## 2020-04-23 MED ORDER — PROPOFOL 10 MG/ML IV BOLUS
INTRAVENOUS | Status: DC | PRN
  Administered 2020-04-23: 140 mg via INTRAVENOUS

## 2020-04-23 MED ORDER — HYDROMORPHONE HCL 1 MG/ML IJ SOLN
1.0000 mg | INTRAMUSCULAR | Status: DC | PRN
  Administered 2020-04-23 (×2): 1 mg via INTRAVENOUS
  Filled 2020-04-23 (×2): qty 1

## 2020-04-23 MED ORDER — ROCURONIUM BROMIDE 10 MG/ML (PF) SYRINGE
PREFILLED_SYRINGE | INTRAVENOUS | Status: AC
  Filled 2020-04-23: qty 10

## 2020-04-23 MED ORDER — LIDOCAINE 2% (20 MG/ML) 5 ML SYRINGE
INTRAMUSCULAR | Status: DC | PRN
  Administered 2020-04-23: 100 mg via INTRAVENOUS

## 2020-04-23 MED ORDER — VANCOMYCIN HCL IN DEXTROSE 1-5 GM/200ML-% IV SOLN
INTRAVENOUS | Status: AC
  Administered 2020-04-24: 1000 mg via INTRAVENOUS
  Filled 2020-04-23: qty 200

## 2020-04-23 MED ORDER — POLYETHYLENE GLYCOL 3350 17 G PO PACK: 17.0000 g | PACK | Freq: Every day | ORAL | Status: DC | PRN

## 2020-04-23 MED ORDER — ONDANSETRON HCL 4 MG/2ML IJ SOLN: 4.0000 mg | Freq: Four times a day (QID) | INTRAMUSCULAR | Status: DC | PRN

## 2020-04-23 MED ORDER — ONDANSETRON HCL 4 MG/2ML IJ SOLN
INTRAMUSCULAR | Status: AC
  Filled 2020-04-23: qty 2

## 2020-04-23 MED ORDER — SUCCINYLCHOLINE CHLORIDE 200 MG/10ML IV SOSY
PREFILLED_SYRINGE | INTRAVENOUS | Status: AC
  Filled 2020-04-23: qty 10

## 2020-04-23 MED ORDER — VANCOMYCIN HCL 1000 MG IV SOLR
INTRAVENOUS | Status: DC | PRN
  Administered 2020-04-23: 1000 mg via TOPICAL

## 2020-04-23 MED ORDER — SUCCINYLCHOLINE CHLORIDE 200 MG/10ML IV SOSY
PREFILLED_SYRINGE | INTRAVENOUS | Status: DC | PRN
  Administered 2020-04-23: 100 mg via INTRAVENOUS

## 2020-04-23 MED ORDER — PHENYLEPHRINE 40 MCG/ML (10ML) SYRINGE FOR IV PUSH (FOR BLOOD PRESSURE SUPPORT)
PREFILLED_SYRINGE | INTRAVENOUS | Status: DC | PRN
  Administered 2020-04-23 (×5): 80 ug via INTRAVENOUS

## 2020-04-23 MED ORDER — EPHEDRINE SULFATE-NACL 50-0.9 MG/10ML-% IV SOSY
PREFILLED_SYRINGE | INTRAVENOUS | Status: DC | PRN
  Administered 2020-04-23: 10 mg via INTRAVENOUS

## 2020-04-23 MED ORDER — 0.9 % SODIUM CHLORIDE (POUR BTL) OPTIME
TOPICAL | Status: DC | PRN
  Administered 2020-04-23: 1000 mL

## 2020-04-23 MED ORDER — FENTANYL CITRATE (PF) 250 MCG/5ML IJ SOLN
INTRAMUSCULAR | Status: AC
  Filled 2020-04-23: qty 5

## 2020-04-23 MED ORDER — DOCUSATE SODIUM 100 MG PO CAPS
100.0000 mg | ORAL_CAPSULE | Freq: Two times a day (BID) | ORAL | Status: DC
  Administered 2020-04-23 – 2020-04-28 (×10): 100 mg via ORAL
  Filled 2020-04-23 (×12): qty 1

## 2020-04-23 MED ORDER — DEXAMETHASONE SODIUM PHOSPHATE 10 MG/ML IJ SOLN
INTRAMUSCULAR | Status: DC | PRN
  Administered 2020-04-23: 5 mg via INTRAVENOUS

## 2020-04-23 MED ORDER — ONDANSETRON HCL 4 MG/2ML IJ SOLN
INTRAMUSCULAR | Status: DC | PRN
  Administered 2020-04-23: 4 mg via INTRAVENOUS

## 2020-04-23 MED ORDER — VANCOMYCIN HCL 1000 MG IV SOLR
INTRAVENOUS | Status: AC
  Filled 2020-04-23: qty 1000

## 2020-04-23 MED ORDER — LACTATED RINGERS IV SOLN: INTRAVENOUS | Status: DC | PRN

## 2020-04-23 MED ORDER — LIDOCAINE 2% (20 MG/ML) 5 ML SYRINGE
INTRAMUSCULAR | Status: AC
  Filled 2020-04-23: qty 5

## 2020-04-23 MED ORDER — METOCLOPRAMIDE HCL 5 MG/ML IJ SOLN: 5.0000 mg | Freq: Three times a day (TID) | INTRAMUSCULAR | Status: DC | PRN

## 2020-04-23 MED ORDER — METHOCARBAMOL 500 MG PO TABS
500.0000 mg | ORAL_TABLET | Freq: Four times a day (QID) | ORAL | Status: DC | PRN
  Administered 2020-04-24 – 2020-04-28 (×6): 500 mg via ORAL
  Filled 2020-04-23 (×6): qty 1

## 2020-04-23 MED ORDER — PROPOFOL 10 MG/ML IV BOLUS
INTRAVENOUS | Status: AC
  Filled 2020-04-23: qty 20

## 2020-04-23 MED ORDER — ROCURONIUM BROMIDE 10 MG/ML (PF) SYRINGE
PREFILLED_SYRINGE | INTRAVENOUS | Status: DC | PRN
  Administered 2020-04-23: 30 mg via INTRAVENOUS

## 2020-04-23 MED ORDER — ALBUMIN HUMAN 5 % IV SOLN: INTRAVENOUS | Status: DC | PRN

## 2020-04-23 MED ORDER — DEXAMETHASONE SODIUM PHOSPHATE 10 MG/ML IJ SOLN
INTRAMUSCULAR | Status: AC
  Filled 2020-04-23: qty 1

## 2020-04-23 MED ORDER — FENTANYL CITRATE (PF) 250 MCG/5ML IJ SOLN
INTRAMUSCULAR | Status: DC | PRN
  Administered 2020-04-23: 100 ug via INTRAVENOUS
  Administered 2020-04-23: 50 ug via INTRAVENOUS

## 2020-04-23 MED ORDER — CHLORHEXIDINE GLUCONATE 4 % EX LIQD: 60.0000 mL | Freq: Once | CUTANEOUS | Status: DC

## 2020-04-23 MED ORDER — VANCOMYCIN HCL IN DEXTROSE 1-5 GM/200ML-% IV SOLN
1000.0000 mg | INTRAVENOUS | Status: AC
  Administered 2020-04-23: 1000 mg via INTRAVENOUS

## 2020-04-23 MED ORDER — ONDANSETRON HCL 4 MG PO TABS: 4.0000 mg | ORAL_TABLET | Freq: Four times a day (QID) | ORAL | Status: DC | PRN

## 2020-04-23 MED ORDER — CHLORHEXIDINE GLUCONATE 0.12 % MT SOLN
OROMUCOSAL | Status: AC
  Filled 2020-04-23: qty 15

## 2020-04-23 MED ORDER — AMISULPRIDE (ANTIEMETIC) 5 MG/2ML IV SOLN: 10.0000 mg | Freq: Once | INTRAVENOUS | Status: DC | PRN

## 2020-04-23 MED ORDER — METOCLOPRAMIDE HCL 5 MG PO TABS: 5.0000 mg | ORAL_TABLET | Freq: Three times a day (TID) | ORAL | Status: DC | PRN

## 2020-04-23 MED ORDER — METHOCARBAMOL 1000 MG/10ML IJ SOLN
500.0000 mg | Freq: Four times a day (QID) | INTRAVENOUS | Status: DC | PRN
  Filled 2020-04-23: qty 5

## 2020-04-23 MED ORDER — ENSURE PRE-SURGERY PO LIQD: 296.0000 mL | Freq: Once | ORAL | Status: DC

## 2020-04-23 MED ORDER — VANCOMYCIN HCL IN DEXTROSE 1-5 GM/200ML-% IV SOLN
1000.0000 mg | Freq: Two times a day (BID) | INTRAVENOUS | Status: DC
  Filled 2020-04-23 (×2): qty 200

## 2020-04-23 MED ORDER — FENTANYL CITRATE (PF) 100 MCG/2ML IJ SOLN
25.0000 ug | INTRAMUSCULAR | Status: DC | PRN
  Administered 2020-04-23: 25 ug via INTRAVENOUS

## 2020-04-23 MED ORDER — OXYCODONE-ACETAMINOPHEN 5-325 MG PO TABS
1.0000 | ORAL_TABLET | ORAL | Status: DC | PRN
Start: 2020-04-23 — End: 2020-04-29
  Administered 2020-04-24 – 2020-04-26 (×5): 1 via ORAL
  Filled 2020-04-23 (×5): qty 1

## 2020-04-23 MED ORDER — POVIDONE-IODINE 10 % EX SWAB
2.0000 "application " | Freq: Once | CUTANEOUS | Status: AC
  Administered 2020-04-23: 2 via TOPICAL

## 2020-04-23 SURGICAL SUPPLY — 48 items
ADH SKN CLS APL DERMABOND .7 (GAUZE/BANDAGES/DRESSINGS) ×1
APL PRP STRL LF DISP 70% ISPRP (MISCELLANEOUS) ×1
BIT DRILL INTERTAN LAG SCREW (BIT) ×1 IMPLANT
BIT DRILL LONG 4.0 (BIT) IMPLANT
BRUSH SCRUB EZ PLAIN DRY (MISCELLANEOUS) ×4 IMPLANT
CHLORAPREP W/TINT 26 (MISCELLANEOUS) ×2 IMPLANT
COVER PERINEAL POST (MISCELLANEOUS) ×2 IMPLANT
COVER SURGICAL LIGHT HANDLE (MISCELLANEOUS) ×2 IMPLANT
COVER WAND RF STERILE (DRAPES) IMPLANT
DERMABOND ADVANCED (GAUZE/BANDAGES/DRESSINGS) ×1
DERMABOND ADVANCED .7 DNX12 (GAUZE/BANDAGES/DRESSINGS) ×1 IMPLANT
DRAPE C-ARM 35X43 STRL (DRAPES) ×2 IMPLANT
DRAPE IMP U-DRAPE 54X76 (DRAPES) ×4 IMPLANT
DRAPE INCISE IOBAN 66X45 STRL (DRAPES) ×2 IMPLANT
DRAPE STERI IOBAN 125X83 (DRAPES) ×2 IMPLANT
DRAPE SURG 17X23 STRL (DRAPES) ×4 IMPLANT
DRAPE U-SHAPE 47X51 STRL (DRAPES) ×2 IMPLANT
DRILL BIT LONG 4.0 (BIT) ×2
DRSG MEPILEX BORDER 4X4 (GAUZE/BANDAGES/DRESSINGS) ×3 IMPLANT
DRSG MEPILEX BORDER 4X8 (GAUZE/BANDAGES/DRESSINGS) ×2 IMPLANT
ELECT REM PT RETURN 9FT ADLT (ELECTROSURGICAL) ×2
ELECTRODE REM PT RTRN 9FT ADLT (ELECTROSURGICAL) ×1 IMPLANT
GLOVE BIO SURGEON STRL SZ 6.5 (GLOVE) ×6 IMPLANT
GLOVE BIO SURGEON STRL SZ7.5 (GLOVE) ×8 IMPLANT
GLOVE BIOGEL PI IND STRL 7.5 (GLOVE) ×1 IMPLANT
GLOVE BIOGEL PI INDICATOR 7.5 (GLOVE) ×1
GLOVE SURG UNDER POLY LF SZ6.5 (GLOVE) ×2 IMPLANT
GOWN STRL REUS W/ TWL LRG LVL3 (GOWN DISPOSABLE) ×1 IMPLANT
GOWN STRL REUS W/TWL LRG LVL3 (GOWN DISPOSABLE) ×2
GUIDE PIN 3.2X343 (PIN) ×2
GUIDE PIN 3.2X343MM (PIN) ×4
KIT BASIN OR (CUSTOM PROCEDURE TRAY) ×2 IMPLANT
KIT TURNOVER KIT B (KITS) ×2 IMPLANT
MANIFOLD NEPTUNE II (INSTRUMENTS) ×2 IMPLANT
NAIL INTERTAN 10X18 130D 10S (Nail) ×1 IMPLANT
NS IRRIG 1000ML POUR BTL (IV SOLUTION) ×2 IMPLANT
PACK GENERAL/GYN (CUSTOM PROCEDURE TRAY) ×2 IMPLANT
PAD ARMBOARD 7.5X6 YLW CONV (MISCELLANEOUS) ×4 IMPLANT
PIN GUIDE 3.2X343MM (PIN) IMPLANT
SCREW LAG COMPR KIT 90/85 (Screw) ×1 IMPLANT
SUT MNCRL AB 3-0 PS2 18 (SUTURE) ×2 IMPLANT
SUT MNCRL AB 4-0 PS2 18 (SUTURE) ×1 IMPLANT
SUT VIC AB 0 CT1 27 (SUTURE)
SUT VIC AB 0 CT1 27XBRD ANBCTR (SUTURE) IMPLANT
SUT VIC AB 2-0 CT1 27 (SUTURE) ×4
SUT VIC AB 2-0 CT1 TAPERPNT 27 (SUTURE) ×2 IMPLANT
TOWEL GREEN STERILE (TOWEL DISPOSABLE) ×4 IMPLANT
WATER STERILE IRR 1000ML POUR (IV SOLUTION) ×2 IMPLANT

## 2020-04-23 NOTE — Anesthesia Preprocedure Evaluation (Addendum)
Anesthesia Evaluation   Patient confused    Reviewed: Allergy & Precautions, NPO status , Patient's Chart, lab work & pertinent test results  Airway Mallampati: II  TM Distance: >3 FB Neck ROM: Full    Dental  (+) Dental Advisory Given   Pulmonary asthma , sleep apnea ,    Pulmonary exam normal        Cardiovascular hypertension, Pt. on medications Normal cardiovascular exam+ dysrhythmias Atrial Fibrillation      Neuro/Psych TIAnegative psych ROS   GI/Hepatic Neg liver ROS, GERD  ,  Endo/Other  negative endocrine ROS  Renal/GU Renal InsufficiencyRenal disease  negative genitourinary   Musculoskeletal negative musculoskeletal ROS (+)   Abdominal   Peds negative pediatric ROS (+)  Hematology negative hematology ROS (+)   Anesthesia Other Findings   Reproductive/Obstetrics negative OB ROS                            Anesthesia Physical  Anesthesia Plan  ASA: III  Anesthesia Plan: MAC   Post-op Pain Management:    Induction: Intravenous  PONV Risk Score and Plan: 4 or greater and Ondansetron, Treatment may vary due to age or medical condition and Dexamethasone  Airway Management Planned: Oral ETT  Additional Equipment:   Intra-op Plan:   Post-operative Plan: Extubation in OR  Informed Consent: I have reviewed the patients History and Physical, chart, labs and discussed the procedure including the risks, benefits and alternatives for the proposed anesthesia with the patient or authorized representative who has indicated his/her understanding and acceptance.     Dental advisory given and Consent reviewed with POA  Plan Discussed with: Anesthesiologist and CRNA  Anesthesia Plan Comments:        Anesthesia Quick Evaluation

## 2020-04-23 NOTE — Op Note (Signed)
Orthopaedic Surgery Operative Note (CSN: [id number] ) Date of Surgery: 04/23/2020  Admit Date: 04/22/2020   Diagnoses: Pre-Op Diagnoses: Left intertrochanteric femur fracture Left healed distal femur fracture   Post-Op Diagnosis: Same  Procedures: 1. CPT 27245-Cephalomedullary nailing of left intertrochanteric femur fracture 2. CPT 20680-Removal of hardware left femur  Surgeons : Primary: Shona Needles, MD  Assistant: Patrecia Pace, PA-C  Location: OR 12   Anesthesia:General  Antibiotics: Ancef 2g preop with 1 gm vancomycin powder placed topically   Tourniquet time:None    Estimated Blood QQVZ:563 mL  Complications:None  Specimens:None   Implants: Implant Name Type Inv. Item Serial No. Manufacturer Lot No. LRB No. Used Action  NAIL INTERTAN 10X18 130D 10S - [id number] Nail NAIL INTERTAN 10X18 130D 10S  [employer] [id number] Left 1 Implanted  NAIL INTERTAN 10X18 130D 10S - [id number] Nail NAIL INTERTAN 10X18 130D 10S  [employer] [id number] Left 1 Implanted     Indications for Surgery: 87 year old female who sustained a ground-level fall and has a left intertrochanteric femur fracture.  She underwent open reduction internal fixation of the left periprosthetic distal femur fracture with myself in 2019.  This went on to heal uneventfully.  Due to the unstable nature of her hip fracture I recommended proceeding with cephalomedullary nailing of the left hip.  I discussed risks and benefits with the patient and her husband.  Risks included but not limited to bleeding, infection, malunion, nonunion, hardware failure, hardware irritation, nerve or blood vessel injury, DVT, even the possibility anesthetic complications.  They agreed to proceed with surgery and consent was obtained.  Operative Findings: 1.  Removal of proximal two screws from the [employer] NCB distal femoral locking plate 2.  Cephalomedullary nailing of left intertrochanteric femur  fracture using [employer] 10 x 180 mm InterTAN with a 90 mm lag screw with a 85 mm compression screw  Procedure: The patient was identified in the preoperative holding area. Consent was confirmed with the patient and their family and all questions were answered. The operative extremity was marked after confirmation with the patient. she was then brought back to the operating room by our anesthesia colleagues.  She was placed under general anesthetic.  Her feet were positioned in boots to attached to the Hana table.  She was carefully transferred over.  All bony prominences were well-padded.  Traction was applied to the left lower extremity and fluoroscopic imaging was obtained.  Adequate reduction was obtained.  The left lower extremity was then prepped and draped in usual sterile fashion.  Timeout was performed to verify the patient, the procedure, and the extremity.  Preoperative antibiotics were dosed.  I first started out by making a percutaneous incision at the most proximal screw hole the distal femoral plate.  I removed the 4.0 millimeter screw without difficulty.  I then made a small incision proximal to the greater trochanter.  I split the gluteal fascia in line with my incision.  I then directed a threaded guidewire the tip of the greater trochanter into the proximal metaphysis.  I used an entry reamer to enter the medullary canal.  I then seated a 10 x 180 mm nail down the center canal however I needed to remove the next proximal screw in the plate.  The nail was removed I removed the screw in the locking cap without difficulty.  I then placed the nail back down the center canal and it was seated appropriately.  I used AP and  lateral fluoroscopic imaging to place a percutaneous threaded guidewire into the head neck segment.  I confirmed that I had adequate tip apex distance.  I then measured the length and chose to use a 90 mm lag screw.  I drilled the path for the compression screw and placed  an antirotation bar.  I then drilled the path for the lag screw and then proceeded to place the 90 mm lag screw.  I then placed the compression screw and was able to obtain approximately 5 mm of compression.  The nail was then statically locked.  I attempted to place a distal interlocking screw.  However, the plate was in the way and I was unable to obtain adequate distal interlocking.  Due to the basicervical nature of her fracture I felt that a distal interlock was not necessary and that the plate should remain in place to prevent a stress riser.  Final fluoroscopic imaging was obtained.  The incisions were copiously irrigated.  A gram of vancomycin powder was placed into the incisions.  A layer closure of 2-0 Vicryl and 3-0 Monocryl with Dermabond was used to close the skin.  Sterile dressings were placed.  The patient was then awoken from anesthesia and taken to the PACU in stable condition.  Post Op Plan/Instructions: The patient will be weightbearing as tolerated to left lower extremity.  She will receive postoperative Ancef.  She will started on Coumadin postoperative day 1 if her hemoglobin is stable.  We will have her mobilize with physical and Occupational Therapy.  I was present and performed the entire surgery.  Patrecia Pace, PA-C did assist me throughout the case. An assistant was necessary given the difficulty in approach, maintenance of reduction and ability to instrument the fracture.   Katha Hamming, MD [facility]

## 2020-04-23 NOTE — Progress Notes (Signed)
PROGRESS NOTE    [name]   WUJ:[id number]  DOB: 1933/06/17  DOA: 04/22/2020 PCP: Leanna Battles, MD   Brief Narrative:  [name] is a 87 y.o. female with medical history significant for paroxysmal atrial fibrillation chronically anticoagulated on warfarin, recurrent urinary tract infection, hypertension, hyperlipidemia, osteoarthritis status post bilateral knee replacements, who is admitted to [facility] on 04/22/2020 with acute closed left intratrochanteric hip fracture after presenting from home to [facility] Emergency Department complaining of left hip pain.  Xray of left hip > mildly displaced and impacted intratrochanteric fracture of the proximal left femur.   Patient is in OR.     Assessment & Plan:   Principal Problem:   Closed left hip fracture  -she having surgery at this time  Active Problems:   Paroxysmal atrial fibrillation  - holding Warfarin with plans for surgery for above fracture - cont Toprol and follow   CKD 3a - Cr 1.46 - has improved to 1.17 today which is her baseline    Hypertension  - Chlorthalidone and Losartan are on hold - cont Toprol    Leukocytosis - possibly stress induced - UA is negative - COVID is negative - no other signs of infection   Anemia, normocytic - Hb yesterday was 10.1 and today is 8.9- her last Hb in 2019 was 8.4 - follow    HLD (hyperlipidemia) - cont Lipitor  Time spent in minutes: 20 DVT prophylaxis: per ortho Code Status: Full code Family Communication:  Level of Care: Level of care: Med-Surg Disposition Plan:  Status is: Inpatient  Remains inpatient appropriate because:IV treatments appropriate due to intensity of illness or inability to take PO   Dispo: The patient is from: Home              Anticipated d/c is to: TBD              Anticipated d/c date is: 3 days              Patient currently is not medically stable to d/c.   Difficult to place patient No      Consultants:   Orthopedic  surgery Procedures:    Antimicrobials:  Anti-infectives (From admission, onward)   Start     Dose/Rate Route Frequency Ordered Stop   04/23/20 1415  vancomycin (VANCOCIN) IVPB 1000 mg/200 mL premix        1,000 mg 200 mL/hr over 60 Minutes Intravenous On call to O.R. 04/23/20 1352 04/24/20 0559   04/23/20 1404  vancomycin (VANCOCIN) 1-5 GM/200ML-% IVPB       Note to Pharmacy: Granville Lewis, Lindsi   : cabinet override      04/23/20 1404 04/24/20 0214       Objective: Vitals:   04/23/20 0900 04/23/20 1000 04/23/20 1200 04/23/20 1300  BP: (!) 159/65 (!) 171/74 (!) 151/67 (!) 143/60  Pulse: 84 85 84 85  Resp: 17 17 15 19   Temp:      TempSrc:      SpO2: 91% 97% 94% 95%   No intake or output data in the 24 hours ending 04/23/20 1421 There were no vitals filed for this visit.  Examination: Patient in surgery  Data Reviewed: I have personally reviewed following labs and imaging studies  CBC: Recent Labs  Lab 04/22/20 1650 04/23/20 0500  WBC 11.7* 9.2  NEUTROABS 10.4* 7.1  HGB 10.1* 8.9*  HCT 30.8* 28.3*  MCV 88.5 87.9  PLT 269 956   Basic Metabolic Panel: Recent  Labs  Lab 04/22/20 1650 04/23/20 0500  NA 139 135  K 4.1 3.8  CL 105 103  CO2 23 21*  GLUCOSE 165* 130*  BUN 30* 26*  CREATININE 1.46* 1.17*  CALCIUM 9.1 8.7*  MG  --  1.7   GFR: CrCl cannot be calculated (Unknown ideal weight.). Liver Function Tests: No results for input(s): AST, ALT, ALKPHOS, BILITOT, PROT, ALBUMIN in the last 168 hours. No results for input(s): LIPASE, AMYLASE in the last 168 hours. No results for input(s): AMMONIA in the last 168 hours. Coagulation Profile: Recent Labs  Lab 04/22/20 1650 04/23/20 0500  INR 2.3* 1.5*   Cardiac Enzymes: No results for input(s): CKTOTAL, CKMB, CKMBINDEX, TROPONINI in the last 168 hours. BNP (last 3 results) No results for input(s): PROBNP in the last 8760 hours. HbA1C: No results for input(s): HGBA1C in the last 72 hours. CBG: Recent Labs   Lab 04/23/20 1229  GLUCAP 145*   Lipid Profile: No results for input(s): CHOL, HDL, LDLCALC, TRIG, CHOLHDL, LDLDIRECT in the last 72 hours. Thyroid Function Tests: No results for input(s): TSH, T4TOTAL, FREET4, T3FREE, THYROIDAB in the last 72 hours. Anemia Panel: No results for input(s): VITAMINB12, FOLATE, FERRITIN, TIBC, IRON, RETICCTPCT in the last 72 hours. Urine analysis:    Component Value Date/Time   COLORURINE YELLOW 04/23/2020 0013   APPEARANCEUR CLEAR 04/23/2020 0013   LABSPEC 1.017 04/23/2020 0013   PHURINE 6.0 04/23/2020 0013   GLUCOSEU NEGATIVE 04/23/2020 0013   HGBUR NEGATIVE 04/23/2020 0013   BILIRUBINUR NEGATIVE 04/23/2020 0013   KETONESUR NEGATIVE 04/23/2020 0013   PROTEINUR NEGATIVE 04/23/2020 0013   UROBILINOGEN 0.2 07/28/2014 1239   NITRITE NEGATIVE 04/23/2020 0013   LEUKOCYTESUR TRACE (A) 04/23/2020 0013   Sepsis Labs: @LABRCNTIP (procalcitonin:4,lacticidven:4) ) Recent Results (from the past 240 hour(s))  SARS CORONAVIRUS 2 (TAT 6-24 HRS) Nasopharyngeal Nasopharyngeal Swab     Status: None   Collection Time: 04/22/20  5:21 PM   Specimen: Nasopharyngeal Swab  Result Value Ref Range Status   SARS Coronavirus 2 NEGATIVE NEGATIVE Final    Comment: (NOTE) SARS-CoV-2 target nucleic acids are NOT DETECTED.  The SARS-CoV-2 RNA is generally detectable in upper and lower respiratory specimens during the acute phase of infection. Negative results do not preclude SARS-CoV-2 infection, do not rule out co-infections with other pathogens, and should not be used as the sole basis for treatment or other patient management decisions. Negative results must be combined with clinical observations, patient history, and epidemiological information. The expected result is Negative.  Fact Sheet for Patients: SugarRoll.be  Fact Sheet for Healthcare Providers: https://www.woods-mathews.com/  This test is not yet approved or  cleared by the [place] FDA and  has been authorized for detection and/or diagnosis of SARS-CoV-2 by FDA under an Emergency Use Authorization (EUA). This EUA will remain  in effect (meaning this test can be used) for the duration of the COVID-19 declaration under Se ction 564(b)(1) of the Act, 21 U.S.C. section 360bbb-3(b)(1), unless the authorization is terminated or revoked sooner.  Performed at [facility], [place] [address] [place], [place] [address]          Radiology Studies: DG Chest 1 View  Result Date: 04/22/2020 CLINICAL DATA:  Preoperative evaluation EXAM: CHEST  1 VIEW COMPARISON:  08/06/2017 FINDINGS: Chronic interstitial prominence with scarring at the lung bases. Possible mild superimposed interstitial edema. Mild cardiomegaly. No pleural effusion or pneumothorax. IMPRESSION: Chronic interstitial prominence with possible superimposed mild interstitial edema. Mild cardiomegaly. Electronically Signed  By: Macy Mis M.D.   On: 04/22/2020 16:09   DG Humerus Left  Result Date: 04/22/2020 CLINICAL DATA:  Fall, left arm pain EXAM: LEFT HUMERUS - 2+ VIEW COMPARISON:  08/09/2017 FINDINGS: Degenerative changes in the left [facility] joint. No acute bony abnormality. Specifically, no fracture, subluxation, or dislocation. IMPRESSION: No acute bony abnormality. Electronically Signed   By: Rolm Baptise M.D.   On: 04/22/2020 18:09   DG Hip Unilat With Pelvis 2-3 Views Left  Result Date: 04/22/2020 CLINICAL DATA:  Fall, pain EXAM: DG HIP (WITH OR WITHOUT PELVIS) 2-3V LEFT COMPARISON:  08/08/2017 FINDINGS: Osteopenia. Mildly displaced and impacted intratrochanteric fractures of the proximal left femur. The pelvis and proximal right femur appear intact in frontal view only. Partially imaged plate and screw fixation of the femoral diaphysis. IMPRESSION: 1. Mildly displaced and impacted intratrochanteric fractures of the proximal left femur. 2. The pelvis and proximal right femur  appear intact in frontal view only. Electronically Signed   By: Eddie Candle M.D.   On: 04/22/2020 16:23      Scheduled Meds: . [MAR Hold] atorvastatin  20 mg Oral QPC supper  . chlorhexidine  60 mL Topical Once  . chlorhexidine      . feeding supplement  296 mL Oral Once  . [MAR Hold] fentaNYL (SUBLIMAZE) injection  25 mcg Intravenous Once  . [MAR Hold] metoprolol succinate  25 mg Oral Daily  . [MAR Hold] pantoprazole (PROTONIX) IV  40 mg Intravenous Q24H  . povidone-iodine  2 application Topical Once   Continuous Infusions: . vancomycin    . vancomycin       LOS: 1 day      Debbe Odea, MD [facility] Pager: [email or web address] 04/23/2020, 2:21 PM

## 2020-04-23 NOTE — ED Notes (Signed)
Patient reports back and neck pain due to bed, this RN and NT pulled patient up in bed. Rt leg elevated with sheets and pillow and Lt leg elevated with one fitted flat sheet per patient request. Distal BLE with cap refill <3 seconds This RN offered patient hospital bed, but she declined due to having to move from one bed to the other and increasing pain r/t same. This RN replaced PureWick per patient request. HOB elevated at position of most comfortable per patient. Patient request RN not to leave room, RN apologized for having to do same, but explained that there are other patients. She nodded in understanding and denies further needs at this time.

## 2020-04-23 NOTE — Anesthesia Procedure Notes (Signed)
Procedure Name: Intubation Date/Time: 04/23/2020 2:41 PM Performed by: Trinna Post., CRNA Pre-anesthesia Checklist: Patient identified, Emergency Drugs available, Suction available, Patient being monitored and Timeout performed Patient Re-evaluated:Patient Re-evaluated prior to induction Oxygen Delivery Method: Circle system utilized Preoxygenation: Pre-oxygenation with 100% oxygen Induction Type: IV induction, Rapid sequence and Cricoid Pressure applied Laryngoscope Size: Mac and 3 Grade View: Grade I Tube type: Oral Tube size: 7.5 mm Number of attempts: 1 Airway Equipment and Method: Stylet Placement Confirmation: ETT inserted through vocal cords under direct vision,  positive ETCO2 and breath sounds checked- equal and bilateral Secured at: 22 cm Tube secured with: Tape Dental Injury: Teeth and Oropharynx as per pre-operative assessment

## 2020-04-23 NOTE — Anesthesia Preprocedure Evaluation (Deleted)
Anesthesia Evaluation    Reviewed: Allergy & Precautions, Patient's Chart, lab work & pertinent test results  Airway        Dental   Pulmonary asthma , sleep apnea ,           Cardiovascular hypertension, Pt. on medications + dysrhythmias Atrial Fibrillation      Neuro/Psych TIAnegative psych ROS   GI/Hepatic negative GI ROS, Neg liver ROS,   Endo/Other  negative endocrine ROS  Renal/GU Renal InsufficiencyRenal disease  negative genitourinary   Musculoskeletal negative musculoskeletal ROS (+)   Abdominal   Peds negative pediatric ROS (+)  Hematology negative hematology ROS (+)   Anesthesia Other Findings   Reproductive/Obstetrics negative OB ROS                             Anesthesia Physical  Anesthesia Plan  ASA: III  Anesthesia Plan: MAC   Post-op Pain Management:    Induction: Intravenous  PONV Risk Score and Plan: 4 or greater and Ondansetron, Treatment may vary due to age or medical condition and Dexamethasone  Airway Management Planned: Oral ETT  Additional Equipment:   Intra-op Plan:   Post-operative Plan: Extubation in OR  Informed Consent:   Plan Discussed with: Anesthesiologist  Anesthesia Plan Comments:         Anesthesia Quick Evaluation

## 2020-04-23 NOTE — Interval H&P Note (Signed)
History and Physical Interval Note:  04/23/2020 2:07 PM  [name]  has presented today for surgery, with the diagnosis of left hip frature.  The various methods of treatment have been discussed with the patient and family. After consideration of risks, benefits and other options for treatment, the patient has consented to  Procedure(s): INTRAMEDULLARY (IM) NAIL INTERTROCHANTRIC (Left) as a surgical intervention.  The patient's history has been reviewed, patient examined, no change in status, stable for surgery.  I have reviewed the patient's chart and labs.  Questions were answered to the patient's satisfaction.     Lennette Bihari P Chaunice Obie

## 2020-04-23 NOTE — Anesthesia Postprocedure Evaluation (Signed)
Anesthesia Post Note  Patient: [name]  Procedure(s) Performed: INTRAMEDULLARY (IM) NAIL INTERTROCHANTRIC (Left )     Patient location during evaluation: PACU Anesthesia Type: General Level of consciousness: sedated Pain management: pain level controlled Vital Signs Assessment: post-procedure vital signs reviewed and stable Respiratory status: spontaneous breathing and respiratory function stable Cardiovascular status: stable Postop Assessment: no apparent nausea or vomiting Anesthetic complications: no   No complications documented.  Last Vitals:  Vitals:   04/23/20 1640 04/23/20 1655  BP: (!) 129/55   Pulse: 76 75  Resp: 12 13  Temp:    SpO2: 95% 94%    Last Pain:  Vitals:   04/23/20 1625  TempSrc:   PainSc: 0-No pain                 Clayburn Weekly DANIEL

## 2020-04-23 NOTE — ED Notes (Signed)
Dr Wynelle Cleveland secured messaged  Could the patient get something else for pain? Fentanyl is only working for minimal time and pt is rating pain at an 8

## 2020-04-23 NOTE — ED Notes (Signed)
Patient rolled into slight Rt lateral position with pillows and blankets supporting Lt side. Patient voices relief with same and denies further needs.

## 2020-04-23 NOTE — Transfer of Care (Signed)
Immediate Anesthesia Transfer of Care Note  Patient: [name]  Procedure(s) Performed: INTRAMEDULLARY (IM) NAIL INTERTROCHANTRIC (Left )  Patient Location: PACU  Anesthesia Type:General  Level of Consciousness: awake and drowsy  Airway & Oxygen Therapy: Patient Spontanous Breathing and Patient connected to face mask oxygen  Post-op Assessment: Report given to RN and Post -op Vital signs reviewed and stable  Post vital signs: Reviewed and stable  Last Vitals:  Vitals Value Taken Time  BP    Temp    Pulse 75 04/23/20 1607  Resp 13 04/23/20 1607  SpO2 100 % 04/23/20 1607  Vitals shown include unvalidated device data.  Last Pain:  Vitals:   04/23/20 1230  TempSrc:   PainSc: 3          Complications: No complications documented.

## 2020-04-24 ENCOUNTER — Encounter (HOSPITAL_COMMUNITY): Payer: Self-pay | Admitting: Student

## 2020-04-24 DIAGNOSIS — D62 Acute posthemorrhagic anemia: Secondary | ICD-10-CM

## 2020-04-24 DIAGNOSIS — R410 Disorientation, unspecified: Secondary | ICD-10-CM

## 2020-04-24 DIAGNOSIS — S72145A Nondisplaced intertrochanteric fracture of left femur, initial encounter for closed fracture: Secondary | ICD-10-CM | POA: Diagnosis not present

## 2020-04-24 LAB — BASIC METABOLIC PANEL
Anion gap: 9 (ref 5–15)
BUN: 29 mg/dL — ABNORMAL HIGH (ref 8–23)
CO2: 21 mmol/L — ABNORMAL LOW (ref 22–32)
Calcium: 8.1 mg/dL — ABNORMAL LOW (ref 8.9–10.3)
Chloride: 106 mmol/L (ref 98–111)
Creatinine, Ser: 1.85 mg/dL — ABNORMAL HIGH (ref 0.44–1.00)
GFR, Estimated: 27 mL/min — ABNORMAL LOW (ref >60)
Glucose, Bld: 135 mg/dL — ABNORMAL HIGH (ref 70–99)
Potassium: 4.2 mmol/L (ref 3.5–5.1)
Sodium: 136 mmol/L (ref 135–145)

## 2020-04-24 LAB — CBC WITH DIFFERENTIAL/PLATELET
Abs Immature Granulocytes: 0.03 10*3/uL (ref 0.00–0.07)
Basophils Absolute: 0 10*3/uL (ref 0.0–0.1)
Basophils Relative: 0 %
Eosinophils Absolute: 0 10*3/uL (ref 0.0–0.5)
Eosinophils Relative: 0 %
HCT: 23.2 % — ABNORMAL LOW (ref 36.0–46.0)
Hemoglobin: 7.3 g/dL — ABNORMAL LOW (ref 12.0–15.0)
Immature Granulocytes: 0 %
Lymphocytes Relative: 14 %
Lymphs Abs: 1.2 10*3/uL (ref 0.7–4.0)
MCH: 27.5 pg (ref 26.0–34.0)
MCHC: 31.5 g/dL (ref 30.0–36.0)
MCV: 87.5 fL (ref 80.0–100.0)
Monocytes Absolute: 1.2 10*3/uL — ABNORMAL HIGH (ref 0.1–1.0)
Monocytes Relative: 14 %
Neutro Abs: 6.1 10*3/uL (ref 1.7–7.7)
Neutrophils Relative %: 72 %
Platelets: 188 10*3/uL (ref 150–400)
RBC: 2.65 MIL/uL — ABNORMAL LOW (ref 3.87–5.11)
RDW: 13.9 % (ref 11.5–15.5)
WBC: 8.6 10*3/uL (ref 4.0–10.5)
nRBC: 0 % (ref 0.0–0.2)

## 2020-04-24 LAB — CBC
HCT: 22 % — ABNORMAL LOW (ref 36.0–46.0)
Hemoglobin: 7 g/dL — ABNORMAL LOW (ref 12.0–15.0)
MCH: 28.3 pg (ref 26.0–34.0)
MCHC: 31.8 g/dL (ref 30.0–36.0)
MCV: 89.1 fL (ref 80.0–100.0)
Platelets: 210 10*3/uL (ref 150–400)
RBC: 2.47 MIL/uL — ABNORMAL LOW (ref 3.87–5.11)
RDW: 13.8 % (ref 11.5–15.5)
WBC: 8.7 10*3/uL (ref 4.0–10.5)
nRBC: 0 % (ref 0.0–0.2)

## 2020-04-24 LAB — PROTIME-INR
INR: 1.3 — ABNORMAL HIGH (ref 0.8–1.2)
Prothrombin Time: 15.7 seconds — ABNORMAL HIGH (ref 11.4–15.2)

## 2020-04-24 LAB — PREPARE RBC (CROSSMATCH)

## 2020-04-24 LAB — VITAMIN D 25 HYDROXY (VIT D DEFICIENCY, FRACTURES): Vit D, 25-Hydroxy: 24.5 ng/mL — ABNORMAL LOW (ref 30–100)

## 2020-04-24 MED ORDER — VITAMIN D 25 MCG (1000 UNIT) PO TABS
1000.0000 [IU] | ORAL_TABLET | Freq: Every day | ORAL | Status: DC
  Administered 2020-04-24 – 2020-04-29 (×6): 1000 [IU] via ORAL
  Filled 2020-04-24 (×7): qty 1

## 2020-04-24 MED ORDER — SODIUM CHLORIDE 0.9% IV SOLUTION: Freq: Once | INTRAVENOUS | Status: AC

## 2020-04-24 MED ORDER — HYDROMORPHONE HCL 1 MG/ML IJ SOLN
0.5000 mg | INTRAMUSCULAR | Status: DC | PRN
Start: 2020-04-24 — End: 2020-04-26
  Administered 2020-04-25: 1 mg via INTRAVENOUS
  Filled 2020-04-24: qty 1

## 2020-04-24 NOTE — Progress Notes (Signed)
PROGRESS NOTE    [name]   DGU:[id number]  DOB: 03/02/1934  DOA: 04/22/2020 PCP: Leanna Battles, MD   Brief Narrative:  [name] is a 87 y.o. female with medical history significant for paroxysmal atrial fibrillation chronically anticoagulated on warfarin, recurrent urinary tract infection, hypertension, hyperlipidemia, osteoarthritis status post bilateral knee replacements, who is admitted to [facility] on 04/22/2020 with acute closed left intratrochanteric hip fracture after presenting from home to [facility] Emergency Department complaining of left hip pain.  Xray of left hip > mildly displaced and impacted intratrochanteric fracture of the proximal left femur.  Subjective: Quite sleepy. She has no complaints. Her husband states her cognitive status is not the same as usual. She appears a little confused to him. She is able to tell me she is in the hospital for a hip fracture and has had surgery.     Assessment & Plan:   Principal Problem:   Closed left hip fracture  - management per ortho who is recommending to resume Warfarin. - pain control per ortho  Active Problems: Anemia due to acute blood loss - will transfuse 1 U PRBC as Hb has dropped to 7 today- consent received from husband as patient is quite sleepy  Confused?  - Somnolent on my exam - per husband, she is not at her baseline and this could be due to residual effects of anaesthesia vs due to pain medications or both - keep an eye on her mental status     Paroxysmal atrial fibrillation  - resuming Warfarin   - cont Toprol and follow   CKD 3a - AKI with metabolic acidosis - Cr 9.56 - has improved to 1.17 but subsequently has risen again to 1.60- this may be related to acute blood loss- follow  Vit D deficiency - replace    Hypertension  - Chlorthalidone and Losartan are on hold - cont Toprol for A-fib     Leukocytosis - possibly stress induced - UA is negative - COVID is negative - no other  signs of infection  - has resolved     HLD (hyperlipidemia) - cont Lipitor  Time spent in minutes: 20 DVT prophylaxis: per ortho Code Status: Full code Family Communication: Husband Level of Care: Level of care: Med-Surg Disposition Plan:  Status is: Inpatient  Remains inpatient appropriate because:IV treatments appropriate due to intensity of illness or inability to take PO   Dispo: The patient is from: Home              Anticipated d/c is to: TBD              Anticipated d/c date is: 3 days              Patient currently is not medically stable to d/c.   Difficult to place patient No      Consultants:   Orthopedic surgery Procedures:    Antimicrobials:  Anti-infectives (From admission, onward)   Start     Dose/Rate Route Frequency Ordered Stop   04/24/20 0130  vancomycin (VANCOCIN) IVPB 1000 mg/200 mL premix  Status:  Discontinued        1,000 mg 200 mL/hr over 60 Minutes Intravenous Every 12 hours 04/23/20 1824 04/24/20 1009   04/23/20 1435  vancomycin (VANCOCIN) powder  Status:  Discontinued          As needed 04/23/20 1436 04/23/20 1602   04/23/20 1415  vancomycin (VANCOCIN) IVPB 1000 mg/200 mL premix  1,000 mg 200 mL/hr over 60 Minutes Intravenous On call to O.R. 04/23/20 1352 04/23/20 1545   04/23/20 1404  vancomycin (VANCOCIN) 1-5 GM/200ML-% IVPB       Note to Pharmacy: Granville Lewis, Lindsi   : cabinet override      04/23/20 1404 04/24/20 0233       Objective: Vitals:   04/24/20 1022 04/24/20 1117 04/24/20 1354 04/24/20 1427  BP: (!) 110/53 (!) 107/43 (!) 132/55 (!) 134/46  Pulse: 96 97 90 92  Resp: 18 18 16    Temp: 98 F (36.7 C) 98.8 F (37.1 C) 98.8 F (37.1 C) 98.1 F (36.7 C)  TempSrc: Axillary  Oral Oral  SpO2: 96%  100% 98%  Weight:      Height:        Intake/Output Summary (Last 24 hours) at 04/24/2020 1507 Last data filed at 04/24/2020 1444 Gross per 24 hour  Intake 1938.55 ml  Output 500 ml  Net 1438.55 ml   Filed Weights    04/24/20 0900  Weight: 78.9 kg    Examination: Patient in surgery  Data Reviewed: I have personally reviewed following labs and imaging studies  CBC: Recent Labs  Lab 04/22/20 1650 04/23/20 0500 04/24/20 0827  WBC 11.7* 9.2 8.7  NEUTROABS 10.4* 7.1  --   HGB 10.1* 8.9* 7.0*  HCT 30.8* 28.3* 22.0*  MCV 88.5 87.9 89.1  PLT 269 246 [id number]   Basic Metabolic Panel: Recent Labs  Lab 04/22/20 1650 04/23/20 0500 04/24/20 0827  NA 139 135 136  K 4.1 3.8 4.2  CL 105 103 106  CO2 23 21* 21*  GLUCOSE 165* 130* 135*  BUN 30* 26* 29*  CREATININE 1.46* 1.17* 1.85*  CALCIUM 9.1 8.7* 8.1*  MG  --  1.7  --    GFR: Estimated Creatinine Clearance: 23 mL/min (A) (by C-G formula based on SCr of 1.85 mg/dL (H)). Liver Function Tests: No results for input(s): AST, ALT, ALKPHOS, BILITOT, PROT, ALBUMIN in the last 168 hours. No results for input(s): LIPASE, AMYLASE in the last 168 hours. No results for input(s): AMMONIA in the last 168 hours. Coagulation Profile: Recent Labs  Lab 04/22/20 1650 04/23/20 0500 04/24/20 0827  INR 2.3* 1.5* 1.3*   Cardiac Enzymes: No results for input(s): CKTOTAL, CKMB, CKMBINDEX, TROPONINI in the last 168 hours. BNP (last 3 results) No results for input(s): PROBNP in the last 8760 hours. HbA1C: No results for input(s): HGBA1C in the last 72 hours. CBG: Recent Labs  Lab 04/23/20 1229  GLUCAP 145*   Lipid Profile: No results for input(s): CHOL, HDL, LDLCALC, TRIG, CHOLHDL, LDLDIRECT in the last 72 hours. Thyroid Function Tests: No results for input(s): TSH, T4TOTAL, FREET4, T3FREE, THYROIDAB in the last 72 hours. Anemia Panel: No results for input(s): VITAMINB12, FOLATE, FERRITIN, TIBC, IRON, RETICCTPCT in the last 72 hours. Urine analysis:    Component Value Date/Time   COLORURINE YELLOW 04/23/2020 0013   APPEARANCEUR CLEAR 04/23/2020 0013   LABSPEC 1.017 04/23/2020 0013   PHURINE 6.0 04/23/2020 0013   GLUCOSEU NEGATIVE 04/23/2020 0013    HGBUR NEGATIVE 04/23/2020 0013   BILIRUBINUR NEGATIVE 04/23/2020 0013   KETONESUR NEGATIVE 04/23/2020 0013   PROTEINUR NEGATIVE 04/23/2020 0013   UROBILINOGEN 0.2 07/28/2014 1239   NITRITE NEGATIVE 04/23/2020 0013   LEUKOCYTESUR TRACE (A) 04/23/2020 0013   Sepsis Labs: @LABRCNTIP (procalcitonin:4,lacticidven:4) ) Recent Results (from the past 240 hour(s))  SARS CORONAVIRUS 2 (TAT 6-24 HRS) Nasopharyngeal Nasopharyngeal Swab     Status: None   Collection  Time: 04/22/20  5:21 PM   Specimen: Nasopharyngeal Swab  Result Value Ref Range Status   SARS Coronavirus 2 NEGATIVE NEGATIVE Final    Comment: (NOTE) SARS-CoV-2 target nucleic acids are NOT DETECTED.  The SARS-CoV-2 RNA is generally detectable in upper and lower respiratory specimens during the acute phase of infection. Negative results do not preclude SARS-CoV-2 infection, do not rule out co-infections with other pathogens, and should not be used as the sole basis for treatment or other patient management decisions. Negative results must be combined with clinical observations, patient history, and epidemiological information. The expected result is Negative.  Fact Sheet for Patients: SugarRoll.be  Fact Sheet for Healthcare Providers: https://www.woods-mathews.com/  This test is not yet approved or cleared by the [place] FDA and  has been authorized for detection and/or diagnosis of SARS-CoV-2 by FDA under an Emergency Use Authorization (EUA). This EUA will remain  in effect (meaning this test can be used) for the duration of the COVID-19 declaration under Se ction 564(b)(1) of the Act, 21 U.S.C. section 360bbb-3(b)(1), unless the authorization is terminated or revoked sooner.  Performed at [facility], [place] [address], [place], [place] [address]   Surgical pcr screen     Status: None   Collection Time: 04/23/20  1:54 PM   Specimen: Nasal Mucosa; Nasal Swab  Result  Value Ref Range Status   MRSA, PCR NEGATIVE NEGATIVE Final   Staphylococcus aureus NEGATIVE NEGATIVE Final    Comment: (NOTE) The Xpert SA Assay (FDA approved for NASAL specimens in patients 80 years of age and older), is one component of a comprehensive surveillance program. It is not intended to diagnose infection nor to guide or monitor treatment. Performed at [facility], [place] [address], [place], [place] [address]          Radiology Studies: DG Chest 1 View  Result Date: 04/22/2020 CLINICAL DATA:  Preoperative evaluation EXAM: CHEST  1 VIEW COMPARISON:  08/06/2017 FINDINGS: Chronic interstitial prominence with scarring at the lung bases. Possible mild superimposed interstitial edema. Mild cardiomegaly. No pleural effusion or pneumothorax. IMPRESSION: Chronic interstitial prominence with possible superimposed mild interstitial edema. Mild cardiomegaly. Electronically Signed   By: Macy Mis M.D.   On: 04/22/2020 16:09   DG Humerus Left  Result Date: 04/22/2020 CLINICAL DATA:  Fall, left arm pain EXAM: LEFT HUMERUS - 2+ VIEW COMPARISON:  08/09/2017 FINDINGS: Degenerative changes in the left [facility] joint. No acute bony abnormality. Specifically, no fracture, subluxation, or dislocation. IMPRESSION: No acute bony abnormality. Electronically Signed   By: Rolm Baptise M.D.   On: 04/22/2020 18:09   DG C-Arm 1-60 Min  Result Date: 04/23/2020 CLINICAL DATA:  Left intratrochanteric hip fracture EXAM: DG C-ARM 1-60 MIN; OPERATIVE LEFT HIP WITH PELVIS COMPARISON:  04/22/2020 FLUOROSCOPY TIME:  Fluoroscopy Time:  1 minutes 59 seconds Radiation Exposure Index (if provided by the fluoroscopic device): 23.44 mGy Number of Acquired Spot Images: 6 FINDINGS: Fixation sideplate is again noted along the mid to distal femur. Fracture fragments have been reduced initially with placement of a proximal medullary rod with 2 fixation screws traversing the femoral neck. Fracture fragments are in near  anatomic alignment. IMPRESSION: ORIF of proximal left femur fracture. Electronically Signed   By: Inez Catalina M.D.   On: 04/23/2020 16:07   DG HIP PORT UNILAT W OR W/O PELVIS 1V LEFT  Result Date: 04/23/2020 CLINICAL DATA:  Postop ORIF for left hip fracture. EXAM: DG HIP (WITH OR WITHOUT PELVIS) 1V  PORT LEFT COMPARISON:  Radiographs 04/22/2020. Intraoperative view is earlier today. FINDINGS: Status post ORIF of the intertrochanteric left femur fracture with a intramedullary nail and 2 fixation screws traversing the femoral neck. The hardware is well positioned. There is improved alignment of the main fracture fragments. Pre-existing lateral femoral diaphyseal plate and screws are incompletely visualized, but appear intact. Two of the proximal screws from that plate have been removed in the interval. IMPRESSION: Improved alignment of intertrochanteric left femur fracture status post ORIF. Electronically Signed   By: Richardean Sale M.D.   On: 04/23/2020 16:57   DG HIP OPERATIVE UNILAT W OR W/O PELVIS LEFT  Result Date: 04/23/2020 CLINICAL DATA:  Left intratrochanteric hip fracture EXAM: DG C-ARM 1-60 MIN; OPERATIVE LEFT HIP WITH PELVIS COMPARISON:  04/22/2020 FLUOROSCOPY TIME:  Fluoroscopy Time:  1 minutes 59 seconds Radiation Exposure Index (if provided by the fluoroscopic device): 23.44 mGy Number of Acquired Spot Images: 6 FINDINGS: Fixation sideplate is again noted along the mid to distal femur. Fracture fragments have been reduced initially with placement of a proximal medullary rod with 2 fixation screws traversing the femoral neck. Fracture fragments are in near anatomic alignment. IMPRESSION: ORIF of proximal left femur fracture. Electronically Signed   By: Inez Catalina M.D.   On: 04/23/2020 16:07   DG Hip Unilat With Pelvis 2-3 Views Left  Result Date: 04/22/2020 CLINICAL DATA:  Fall, pain EXAM: DG HIP (WITH OR WITHOUT PELVIS) 2-3V LEFT COMPARISON:  08/08/2017 FINDINGS: Osteopenia. Mildly  displaced and impacted intratrochanteric fractures of the proximal left femur. The pelvis and proximal right femur appear intact in frontal view only. Partially imaged plate and screw fixation of the femoral diaphysis. IMPRESSION: 1. Mildly displaced and impacted intratrochanteric fractures of the proximal left femur. 2. The pelvis and proximal right femur appear intact in frontal view only. Electronically Signed   By: Eddie Candle M.D.   On: 04/22/2020 16:23      Scheduled Meds: . atorvastatin  20 mg Oral QPC supper  . cholecalciferol  1,000 Units Oral Daily  . docusate sodium  100 mg Oral BID  . metoprolol succinate  25 mg Oral Daily  . pantoprazole (PROTONIX) IV  40 mg Intravenous Q24H   Continuous Infusions: . 0.9 % NaCl with KCl 20 mEq / L 75 mL/hr at 04/24/20 1406  . methocarbamol (ROBAXIN) IV       LOS: 2 days      Debbe Odea, MD [facility] Pager: [email or web address] 04/24/2020, 3:07 PM

## 2020-04-24 NOTE — Plan of Care (Signed)
Patient complained of paint to left leg; medicated with relief. Asleep, no signs of distress noted. Will continue to monitor.   Problem: Education: Goal: Knowledge of General Education information will improve Description: Including pain rating scale, medication(s)/side effects and non-pharmacologic comfort measures Outcome: Progressing   Problem: Health Behavior/Discharge Planning: Goal: Ability to manage health-related needs will improve Outcome: Progressing   Problem: Clinical Measurements: Goal: Ability to maintain clinical measurements within normal limits will improve Outcome: Progressing Goal: Will remain free from infection Outcome: Progressing Goal: Diagnostic test results will improve Outcome: Progressing Goal: Respiratory complications will improve Outcome: Progressing Goal: Cardiovascular complication will be avoided Outcome: Progressing   Problem: Activity: Goal: Risk for activity intolerance will decrease Outcome: Progressing   Problem: Nutrition: Goal: Adequate nutrition will be maintained Outcome: Progressing   Problem: Coping: Goal: Level of anxiety will decrease Outcome: Progressing   Problem: Elimination: Goal: Will not experience complications related to bowel motility Outcome: Progressing Goal: Will not experience complications related to urinary retention Outcome: Progressing   Problem: Pain Managment: Goal: General experience of comfort will improve Outcome: Progressing   Problem: Safety: Goal: Ability to remain free from injury will improve Outcome: Progressing   Problem: Skin Integrity: Goal: Risk for impaired skin integrity will decrease Outcome: Progressing

## 2020-04-24 NOTE — Progress Notes (Signed)
Orthopaedic Trauma Progress Note  SUBJECTIVE: Reports mild pain about operative site, better than pre-operatively. No chest pain. No SOB. No nausea/vomiting. No other complaints.  Husband at bedside. He would really like patient to go to CIR but if she doesn't qualify for this, would prefer [facility].   OBJECTIVE:  Vitals:   04/23/20 2011 04/24/20 0616  BP: 125/89 112/61  Pulse: 90 (!) 106  Resp: 16 16  Temp: (!) 97.4 F (36.3 C) 98.4 F (36.9 C)  SpO2: 100% 94%   General: Sitting up in bed, NAD Respiratory: No increased work of breathing.  LLE: Dressings clean, dry, intact.  No significant tenderness to palpation over the hip.  Does not tolerate hip or knee motion currently.  Tolerates gentle passive ankle motion.  Endorses sensation to light touch distally. +DP pulse  IMAGING: Stable post op imaging.   LABS:  Results for orders placed or performed during the hospital encounter of 04/22/20 (from the past 24 hour(s))  CBG monitoring, ED     Status: Abnormal   Collection Time: 04/23/20 12:29 PM  Result Value Ref Range   Glucose-Capillary 145 (H) 70 - 99 mg/dL  Surgical pcr screen     Status: None   Collection Time: 04/23/20  1:54 PM   Specimen: Nasal Mucosa; Nasal Swab  Result Value Ref Range   MRSA, PCR NEGATIVE NEGATIVE   Staphylococcus aureus NEGATIVE NEGATIVE    ASSESSMENT: [name] is a 87 y.o. female, 1 Day Post-Op s/p Procedure(s): 1. CPT 27245-Cephalomedullary nailing of left intertrochanteric femur fracture 2. CPT 20680-Removal of hardware left femur  CV/Blood loss: Hemoglobin 8.9 preoperatively, CBC pending this AM.   PLAN: Weightbearing: WBAT LLE Incisional and dressing care: Plan to remove dressings tomorrow Showering: Okay to begin showering with assistance 04/26/2020.  Incisions may get wet at this point. Orthopedic device(s): None  Pain management:  1. Tylenol 650 mg q 6 hours scheduled 2. Robaxin 500 mg q 6 hours PRN 3. Percocet 5-325 mg q 4 hours  PRN 4. Neurontin 100 mg TID 5. Dilaudid 0.5-1 mg q 3 hours PRN VTE prophylaxis: Restart warfarin today if hemoglobin stable, SCDs ID: Vancomycin post op Foley/Lines: No foley, KVO IVFs Impediments to Fracture Healing: Vitamin D level 26, start on D3 supplementation today. Dispo: PT/OT evaluation today, dispo pending.  Will likely need SNF at discharge. Follow - up plan: 2 weeks after discharge for repeat x-rays  Contact information:  Katha Hamming MD, Patrecia Pace PA-C. After hours and holidays please check [email or web address] for group call information for [facility]   Ashraf Mesta A. Ricci Barker, PA-C [phone] (office) [email or web address]

## 2020-04-24 NOTE — Progress Notes (Signed)
Inpatient Rehab Admissions Coordinator Note:   Per therapy recommendations, pt was screened for CIR candidacy by Shann Medal, PT, DPT.  At this time we are recommending a CIR consult and I will place an order per our protocol.  Please contact me with questions.   Shann Medal, PT, DPT [phone] 04/24/20 12:35 PM

## 2020-04-24 NOTE — Progress Notes (Signed)
ANTICOAGULATION CONSULT NOTE - Initial Consult  Pharmacy Consult for Warfarin Indication: atrial fibrillation  Patient Measurements: Height: 5\' 4"  (162.6 cm) Weight: 78.9 kg (174 lb) IBW/kg (Calculated) : 54.7  Vital Signs: Temp: 98.4 F (36.9 C) (01/27 0616) Temp Source: Oral (01/27 0616) BP: 112/61 (01/27 0616) Pulse Rate: 106 (01/27 0616)  Labs: Recent Labs    04/22/20 1650 04/23/20 0500 04/24/20 0827  HGB 10.1* 8.9* 7.0*  HCT 30.8* 28.3* 22.0*  PLT 269 246 210  LABPROT 24.6* 17.6* 15.7*  INR 2.3* 1.5* 1.3*  CREATININE 1.46* 1.17* 1.85*    Estimated Creatinine Clearance: 23 mL/min (A) (by C-G formula based on SCr of 1.85 mg/dL (H)).   Assessment: 42 YOF on warfarin PTA for hx Afib s/p L-femur fracture repair on 1/26. Pharmacy consulted to start Warfarin without a bridge on POD#1 if Hgb stable.  Hgb dropped to 7 today - discussed with ortho PA Ricci Barker) - will hold off on warfarin start today and f/u on CBC trend in the AM.  Goal of Therapy:  INR 2-3 Monitor platelets by anticoagulation protocol: Yes   Plan:  - Hold Warfarin today - F/u CBC in the AM and ortho plans to resume anticoag  Thank you for allowing pharmacy to be a part of this patient's care.  Alycia Rossetti, PharmD, BCPS Clinical Pharmacist Clinical phone for 04/24/2020: [phone] 04/24/2020 10:42 AM   **Pharmacist phone directory can now be found on [email or web address] ([email or web address]).  Listed under [place].

## 2020-04-24 NOTE — Evaluation (Signed)
Occupational Therapy Evaluation Patient Details Name: [name] MRN: [id number] DOB: 1934/03/09 Today's Date: 04/24/2020    History of Present Illness 87 y.o. female past medical history of A. fib on Coumadin, hypertension, osteoporosis, presenting to the emergency department from home with left hip injury. Patient sustained L intratrochanteric hip fx. Patient s/p IM nail L femur on 1/26.   Clinical Impression   Pt walks with a RW and is assisted for LB ADL and IADL at baseline. Pt presents with pain, lethargy, generalized weakness and poor balance. She is currently dependent in all ADL and requires 2 person assist for all mobility. She was able to stand x 2 with +2 max assist. Husband is requesting CIR referral. Will follow acutely.    Follow Up Recommendations  CIR (SNF if pt does not go to CIR)    Equipment Recommendations  None recommended by OT    Recommendations for Other Services       Precautions / Restrictions Precautions Precautions: Fall Restrictions Weight Bearing Restrictions: Yes LLE Weight Bearing: Weight bearing as tolerated      Mobility Bed Mobility Overal bed mobility: Needs Assistance Bed Mobility: Rolling;Supine to Sit;Sit to Supine Rolling: Total assist;+2 for physical assistance;+2 for safety/equipment   Supine to sit: Total assist;+2 for physical assistance;+2 for safety/equipment Sit to supine: Total assist;+2 for physical assistance;+2 for safety/equipment   General bed mobility comments: assist for all aspects    Transfers Overall transfer level: Needs assistance Equipment used: Rolling walker (2 wheeled) Transfers: Sit to/from Stand Sit to Stand: Max assist;+2 physical assistance;+2 safety/equipment;From elevated surface         General transfer comment: x 2 from elevated bed with use of bed pad under hips    Balance Overall balance assessment: Needs assistance Sitting-balance support: Bilateral upper extremity supported;Feet  supported Sitting balance-Leahy Scale: Fair Sitting balance - Comments: initially requiring minA-min guard to maintain sitting balance, progressing to close supervision   Standing balance support: Bilateral upper extremity supported;During functional activity Standing balance-Leahy Scale: Zero Standing balance comment: heavy reliance on UE support to maintain standing and min-modA for steadying                           ADL either performed or assessed with clinical judgement   ADL                                         General ADL Comments: total assist due to lethargy, incontinent of bowel     Vision Patient Visual Report: No change from baseline       Perception     Praxis      Pertinent Vitals/Pain Pain Assessment: Faces Faces Pain Scale: Hurts even more Pain Location: L hip with movement Pain Descriptors / Indicators: Grimacing;Guarding Pain Intervention(s): Monitored during session;Ice applied;Repositioned     Hand Dominance Right   Extremity/Trunk Assessment Upper Extremity Assessment Upper Extremity Assessment: Generalized weakness   Lower Extremity Assessment Lower Extremity Assessment: Defer to PT evaluation LLE Deficits / Details: grossly 2/5. Patient very stiff with little knee flexion with movement LLE: Unable to fully assess due to pain   Cervical / Trunk Assessment Cervical / Trunk Assessment: Kyphotic   Communication Communication Communication: HOH   Cognition Arousal/Alertness: Lethargic Behavior During Therapy: Flat affect Overall Cognitive Status: Difficult to assess Area of Impairment: Attention;Following commands  Current Attention Level: Selective   Following Commands: Follows one step commands with increased time;Follows multi-step commands inconsistently       General Comments: difficult to assess due to lethargy and HOH. Husband reports she is talkative at baseline, however  this session patient said little to no words   General Comments  Husband present and helpful throughout session    Exercises     Shoulder Instructions      Home Living Family/patient expects to be discharged to:: Private residence Living Arrangements: Spouse/significant other Available Help at Discharge: Family;Available 24 hours/day Type of Home: House Home Access: Ramped entrance     Home Layout: One level     Bathroom Shower/Tub: Occupational psychologist: Handicapped height     Home Equipment: Environmental consultant - 2 wheels;Bedside commode;Shower seat;Toilet riser;Hand held shower head;Wheelchair - Education administrator (comment) (lift chair)          Prior Functioning/Environment Level of Independence: Needs assistance  Gait / Transfers Assistance Needed: ambulates with RW at baseline ADL's / Homemaking Assistance Needed: requires assistance for LB dressing and IADL            OT Problem List: Decreased strength;Decreased activity tolerance;Impaired balance (sitting and/or standing);Decreased cognition;Decreased knowledge of use of DME or AE;Obesity;Pain      OT Treatment/Interventions: Self-care/ADL training;DME and/or AE instruction;Therapeutic activities;Patient/family education;Balance training;Cognitive remediation/compensation    OT Goals(Current goals can be found in the care plan section) Acute Rehab OT Goals Patient Stated Goal: did not state OT Goal Formulation: With family Time For Goal Achievement: 05/08/20 Potential to Achieve Goals: Fair ADL Goals Pt Will Perform Eating: with set-up;sitting Pt Will Perform Grooming: with set-up;sitting Pt Will Perform Upper Body Dressing: with min assist;sitting Pt Will Transfer to Toilet: with mod assist;stand pivot transfer;bedside commode Additional ADL Goal #1: Pt will demonstrate sustained attention in preparation for ADL.  OT Frequency: Min 2X/week   Barriers to D/C:            Co-evaluation PT/OT/SLP  Co-Evaluation/Treatment: Yes Reason for Co-Treatment: For patient/therapist safety PT goals addressed during session: Mobility/safety with mobility;Balance;Proper use of DME OT goals addressed during session: ADL's and self-care;Proper use of Adaptive equipment and DME      AM-PAC OT "6 Clicks" Daily Activity     Outcome Measure Help from another person eating meals?: Total Help from another person taking care of personal grooming?: Total Help from another person toileting, which includes using toliet, bedpan, or urinal?: Total Help from another person bathing (including washing, rinsing, drying)?: Total Help from another person to put on and taking off regular upper body clothing?: Total Help from another person to put on and taking off regular lower body clothing?: Total 6 Click Score: 6   End of Session Equipment Utilized During Treatment: Rolling walker;Gait belt Nurse Communication: Other (comment) (NT needs new purewick)  Activity Tolerance: Patient limited by lethargy Patient left: in bed;with call bell/phone within reach;with bed alarm set;with family/visitor present  OT Visit Diagnosis: Unsteadiness on feet (R26.81);Pain;Muscle weakness (generalized) (M62.81)                Time: 6503-5465 OT Time Calculation (min): 42 min Charges:  OT General Charges $OT Visit: 1 Visit OT Evaluation $OT Eval Moderate Complexity: 1 Mod  Nestor Lewandowsky, OTR/L Acute Rehabilitation Services Pager: [phone] Office: [phone]  Malka So 04/24/2020, 10:04 AM

## 2020-04-24 NOTE — Evaluation (Signed)
Physical Therapy Evaluation Patient Details Name: [name] MRN: [id number] DOB: [birth year] Today's Date: 04/24/2020   History of Present Illness  87 y.o. female past medical history of A. fib on Coumadin, hypertension, osteoporosis, presenting to the emergency department from home with left hip injury. Patient sustained L intratrochanteric hip fx. Patient s/p IM nail L femur on 1/26.  Clinical Impression  PTA, patient lives with husband and ambulates with RW. Patient primarily sleeps in lift chair and uses functions to stand her. Patient requires totalA+2 for bed mobility and maxA+2 for sit to stand transfer with RW. Patient presents with generalized weakness, debility, impaired balance, and impaired functional mobility. Patient will benefit from skilled PT services during acute stay to address listed deficits. Husband wants CIR following discharge to assist with return to PLOF. Recommend CIR following discharge to maximize functional mobility and return to PLOF. If CIR declines patient, recommend SNF following discharge.     Follow Up Recommendations CIR;Supervision/Assistance - 24 hour (Husband wants CIR, if denied CIR, recommend SNF at d/c)    Equipment Recommendations  Rolling Ruhaan Nordahl with 5" wheels    Recommendations for Other Services       Precautions / Restrictions Precautions Precautions: Fall Restrictions Weight Bearing Restrictions: Yes LLE Weight Bearing: Weight bearing as tolerated      Mobility  Bed Mobility Overal bed mobility: Needs Assistance Bed Mobility: Supine to Sit;Rolling;Sit to Supine Rolling: Total assist;+2 for physical assistance;+2 for safety/equipment   Supine to sit: Total assist;+2 for physical assistance;+2 for safety/equipment Sit to supine: Total assist;+2 for physical assistance;+2 for safety/equipment   General bed mobility comments: patient unable to initiate movement and follow commands to perform bed mobility. Patient sleeps in lift chair  at baseline    Transfers Overall transfer level: Needs assistance Equipment used: Rolling Virgilio Broadhead (2 wheeled) Transfers: Sit to/from Stand Sit to Stand: Max assist;+2 physical assistance;+2 safety/equipment;From elevated surface         General transfer comment: maxA+2 for sit to stand x 2 from elevated surface. Cues for hand placement with poor follow through. Patient uses lift chair at baseline to assist with standing. Husband reports she uses it to almost vertical before she stands. Patient reports dizziness following 2nd trial of standing. BP 105/84 , patient's husband reports that is low for the patient.  Ambulation/Gait                Stairs            Wheelchair Mobility    Modified Rankin (Stroke Patients Only)       Balance Overall balance assessment: Needs assistance Sitting-balance support: Bilateral upper extremity supported;Feet supported Sitting balance-Leahy Scale: Poor Sitting balance - Comments: initially requiring minA-min guard to maintain sitting balance, progressing to close supervision   Standing balance support: Bilateral upper extremity supported;During functional activity Standing balance-Leahy Scale: Poor Standing balance comment: heavy reliance on UE support to maintain standing and min-modA for steadying                             Pertinent Vitals/Pain Pain Assessment: Faces Faces Pain Scale: Hurts even more Pain Location: L hip with movement Pain Descriptors / Indicators: Grimacing;Guarding Pain Intervention(s): Limited activity within patient's tolerance;Monitored during session;Repositioned    Home Living Family/patient expects to be discharged to:: Private residence Living Arrangements: Spouse/significant other Available Help at Discharge: Family;Available 24 hours/day Type of Home: House Home Access: Ramped entrance     Home  Layout: One level Home Equipment: Hitomi Slape - 2 wheels;Bedside commode;Shower  seat;Toilet riser;Hand held shower head;Wheelchair - Education administrator (comment) (LIft chair)      Prior Function Level of Independence: Needs assistance   Gait / Transfers Assistance Needed: ambulates with RW at baseline  ADL's / Homemaking Assistance Needed: requires assistance for LB dressing        Hand Dominance        Extremity/Trunk Assessment   Upper Extremity Assessment Upper Extremity Assessment: Defer to OT evaluation    Lower Extremity Assessment Lower Extremity Assessment: Generalized weakness;LLE deficits/detail LLE Deficits / Details: grossly 2/5. Patient very stiff with little knee flexion with movement LLE: Unable to fully assess due to pain    Cervical / Trunk Assessment Cervical / Trunk Assessment: Kyphotic  Communication   Communication: HOH  Cognition Arousal/Alertness: Lethargic Behavior During Therapy: Flat affect Overall Cognitive Status: Impaired/Different from baseline Area of Impairment: Attention;Following commands                   Current Attention Level: Selective   Following Commands: Follows one step commands with increased time;Follows multi-step commands inconsistently       General Comments: difficult to assess due to lethargy. Husband reports she is talkative at baseline, however this session patient said little to no words      General Comments General comments (skin integrity, edema, etc.): Husband present and helpful throughout session    Exercises     Assessment/Plan    PT Assessment Patient needs continued PT services  PT Problem List Decreased strength;Decreased range of motion;Decreased activity tolerance;Decreased balance;Decreased mobility;Decreased safety awareness       PT Treatment Interventions DME instruction;Gait training;Functional mobility training;Therapeutic activities;Therapeutic exercise;Balance training;Patient/family education    PT Goals (Current goals can be found in the Care Plan section)   Acute Rehab PT Goals Patient Stated Goal: did not state PT Goal Formulation: With patient/family Time For Goal Achievement: 05/08/20 Potential to Achieve Goals: Fair    Frequency Min 3X/week   Barriers to discharge        Co-evaluation PT/OT/SLP Co-Evaluation/Treatment: Yes Reason for Co-Treatment: For patient/therapist safety;To address functional/ADL transfers PT goals addressed during session: Mobility/safety with mobility;Balance;Proper use of DME         AM-PAC PT "6 Clicks" Mobility  Outcome Measure Help needed turning from your back to your side while in a flat bed without using bedrails?: Total Help needed moving from lying on your back to sitting on the side of a flat bed without using bedrails?: Total Help needed moving to and from a bed to a chair (including a wheelchair)?: Total Help needed standing up from a chair using your arms (e.g., wheelchair or bedside chair)?: A Lot Help needed to walk in hospital room?: A Lot Help needed climbing 3-5 steps with a railing? : Total 6 Click Score: 8    End of Session Equipment Utilized During Treatment: Gait belt Activity Tolerance: Patient limited by lethargy Patient left: in bed;with call bell/phone within reach;with family/visitor present Nurse Communication: Mobility status PT Visit Diagnosis: Unsteadiness on feet (R26.81);Other abnormalities of gait and mobility (R26.89);Muscle weakness (generalized) (M62.81);History of falling (Z91.81)    Time: 9892-1194 PT Time Calculation (min) (ACUTE ONLY): 42 min   Charges:   PT Evaluation $PT Eval Moderate Complexity: 1 Mod PT Treatments $Therapeutic Activity: 8-22 mins        Camarie Mctigue A. Gilford Rile PT, DPT Acute Rehabilitation Services Pager [phone] Office [phone]   Alda Lea 04/24/2020, 9:47 AM

## 2020-04-24 NOTE — Progress Notes (Signed)
PHARMACY NOTE:  ANTIMICROBIAL RENAL DOSAGE ADJUSTMENT  Current antimicrobial regimen includes a mismatch between antimicrobial dosage and estimated renal function.  As per policy approved by the Pharmacy & Therapeutics and Medical Executive Committees, the antimicrobial dosage will be adjusted accordingly.  Current antimicrobial dosage:  Vancomycin 1g IV every 12 hours  Indication: Post-op surgical prophylaxis  Renal Function:  Estimated Creatinine Clearance: 23 mL/min (A) (by C-G formula based on SCr of 1.85 mg/dL (H)). []      On intermittent HD, scheduled: []      On CRRT    Antimicrobial dosage has been changed to:  D/ced last standing dose  Additional comments: The patient has already received one dose of post-op coverage ~12h after pre-op dose. Should be sufficient to complete post-op coverage with the patient's current renal function.    Thank you for allowing pharmacy to be a part of this patient's care.  Alycia Rossetti, PharmD, BCPS Clinical Pharmacist Clinical phone for 04/24/2020: [phone] 04/24/2020 10:08 AM   **Pharmacist phone directory can now be found on [email or web address] ([email or web address]).  Listed under [facility].

## 2020-04-25 LAB — CBC
HCT: 21.4 % — ABNORMAL LOW (ref 36.0–46.0)
Hemoglobin: 7 g/dL — ABNORMAL LOW (ref 12.0–15.0)
MCH: 28.6 pg (ref 26.0–34.0)
MCHC: 32.7 g/dL (ref 30.0–36.0)
MCV: 87.3 fL (ref 80.0–100.0)
Platelets: 175 10*3/uL (ref 150–400)
RBC: 2.45 MIL/uL — ABNORMAL LOW (ref 3.87–5.11)
RDW: 14.3 % (ref 11.5–15.5)
WBC: 8 10*3/uL (ref 4.0–10.5)
nRBC: 0 % (ref 0.0–0.2)

## 2020-04-25 LAB — BASIC METABOLIC PANEL
Anion gap: 9 (ref 5–15)
BUN: 28 mg/dL — ABNORMAL HIGH (ref 8–23)
CO2: 21 mmol/L — ABNORMAL LOW (ref 22–32)
Calcium: 8 mg/dL — ABNORMAL LOW (ref 8.9–10.3)
Chloride: 108 mmol/L (ref 98–111)
Creatinine, Ser: 1.75 mg/dL — ABNORMAL HIGH (ref 0.44–1.00)
GFR, Estimated: 28 mL/min — ABNORMAL LOW (ref >60)
Glucose, Bld: 119 mg/dL — ABNORMAL HIGH (ref 70–99)
Potassium: 4.3 mmol/L (ref 3.5–5.1)
Sodium: 138 mmol/L (ref 135–145)

## 2020-04-25 LAB — TYPE AND SCREEN
ABO/RH(D): A POS
Antibody Screen: NEGATIVE
Unit division: 0

## 2020-04-25 LAB — BPAM RBC
Blood Product Expiration Date: 202202212359
ISSUE DATE / TIME: 202201271049
Unit Type and Rh: 6200

## 2020-04-25 LAB — PROTIME-INR
INR: 1.2 (ref 0.8–1.2)
Prothrombin Time: 15.1 seconds (ref 11.4–15.2)

## 2020-04-25 MED ORDER — WARFARIN SODIUM 7.5 MG PO TABS
7.5000 mg | ORAL_TABLET | Freq: Once | ORAL | Status: AC
  Administered 2020-04-25: 7.5 mg via ORAL
  Filled 2020-04-25: qty 1

## 2020-04-25 MED ORDER — WARFARIN - PHARMACIST DOSING INPATIENT: Freq: Every day | Status: DC

## 2020-04-25 NOTE — TOC CAGE-AID Note (Signed)
Transition of Care [facility]) - CAGE-AID Screening   Patient Details  Name: [name] MRN: [id number] Date of Birth: [birth year]  Contact:    Dia Crawford, RN Phone Number: 04/25/2020, 3:20 PM   Clinical Narrative:  Pt is very [place] and does no have hearing aids and has some confusion d/t TBI. Per husband at bedside pt does not drink/smoke or use drugs.   CAGE-AID Screening: Substance Abuse Screening unable to be completed due to: : Patient unable to participate  Have You Ever Felt You Ought to Cut Down on Your Drinking or Drug Use?: No Have People Annoyed You By Critizing Your Drinking Or Drug Use?: No Have You Felt Bad Or Guilty About Your Drinking Or Drug Use?: No Have You Ever Had a Drink or Used Drugs First Thing In The Morning to Steady Your Nerves or to Get Rid of a Hangover?: No CAGE-AID Score: 0  Substance Abuse Education Offered: No  Substance abuse interventions: Patient and Family Counseling (Per husband at bedside, pt does not use drug/smoke or drink etoh)

## 2020-04-25 NOTE — Progress Notes (Signed)
Orthopaedic Trauma Progress Note  SUBJECTIVE: Reports mild pain about operative site this morning. No chest pain. No SOB. No nausea/vomiting. No other complaints.    OBJECTIVE:  Vitals:   04/24/20 2137 04/25/20 0502  BP: (!) 127/51 (!) 141/53  Pulse: 83 81  Resp: 16 16  Temp: 98.7 F (37.1 C) (!) 97.5 F (36.4 C)  SpO2: 93% 91%   General: Resting in bed, NAD Respiratory: No increased work of breathing.  LLE: Dressings removed, incisions clean, dry, intact.  Swelling through the hip and thigh as expected. No significant tenderness to palpation over the hip.  Does not tolerate hip or knee motion currently. Able to wiggle toes. Tolerates gentle passive ankle motion.  Endorses sensation to light touch distally. +DP pulse  IMAGING: Stable post op imaging.   LABS:  Results for orders placed or performed during the hospital encounter of 04/22/20 (from the past 24 hour(s))  Protime-INR     Status: Abnormal   Collection Time: 04/24/20  8:27 AM  Result Value Ref Range   Prothrombin Time 15.7 (H) 11.4 - 15.2 seconds   INR 1.3 (H) 0.8 - 1.2  VITAMIN D 25 Hydroxy (Vit-D Deficiency, Fractures)     Status: Abnormal   Collection Time: 04/24/20  8:27 AM  Result Value Ref Range   Vit D, 25-Hydroxy 24.50 (L) 30 - 100 ng/mL  CBC     Status: Abnormal   Collection Time: 04/24/20  8:27 AM  Result Value Ref Range   WBC 8.7 4.0 - 10.5 K/uL   RBC 2.47 (L) 3.87 - 5.11 MIL/uL   Hemoglobin 7.0 (L) 12.0 - 15.0 g/dL   HCT 22.0 (L) 36.0 - 46.0 %   MCV 89.1 80.0 - 100.0 fL   MCH 28.3 26.0 - 34.0 pg   MCHC 31.8 30.0 - 36.0 g/dL   RDW 13.8 11.5 - 15.5 %   Platelets 210 150 - 400 K/uL   nRBC 0.0 0.0 - 0.2 %  Basic metabolic panel     Status: Abnormal   Collection Time: 04/24/20  8:27 AM  Result Value Ref Range   Sodium 136 135 - 145 mmol/L   Potassium 4.2 3.5 - 5.1 mmol/L   Chloride 106 98 - 111 mmol/L   CO2 21 (L) 22 - 32 mmol/L   Glucose, Bld 135 (H) 70 - 99 mg/dL   BUN 29 (H) 8 - 23 mg/dL    Creatinine, Ser 1.85 (H) 0.44 - 1.00 mg/dL   Calcium 8.1 (L) 8.9 - 10.3 mg/dL   GFR, Estimated 27 (L) >60 mL/min   Anion gap 9 5 - 15  Prepare RBC (crossmatch)     Status: None   Collection Time: 04/24/20 10:21 AM  Result Value Ref Range   Order Confirmation      ORDER PROCESSED BY BLOOD BANK Performed at [facility], [address] [place] [address]   CBC with Differential/Platelet     Status: Abnormal   Collection Time: 04/24/20  4:28 PM  Result Value Ref Range   WBC 8.6 4.0 - 10.5 K/uL   RBC 2.65 (L) 3.87 - 5.11 MIL/uL   Hemoglobin 7.3 (L) 12.0 - 15.0 g/dL   HCT 23.2 (L) 36.0 - 46.0 %   MCV 87.5 80.0 - 100.0 fL   MCH 27.5 26.0 - 34.0 pg   MCHC 31.5 30.0 - 36.0 g/dL   RDW 13.9 11.5 - 15.5 %   Platelets 188 150 - 400 K/uL   nRBC  0.0 0.0 - 0.2 %   Neutrophils Relative % 72 %   Neutro Abs 6.1 1.7 - 7.7 K/uL   Lymphocytes Relative 14 %   Lymphs Abs 1.2 0.7 - 4.0 K/uL   Monocytes Relative 14 %   Monocytes Absolute 1.2 (H) 0.1 - 1.0 K/uL   Eosinophils Relative 0 %   Eosinophils Absolute 0.0 0.0 - 0.5 K/uL   Basophils Relative 0 %   Basophils Absolute 0.0 0.0 - 0.1 K/uL   Immature Granulocytes 0 %   Abs Immature Granulocytes 0.03 0.00 - 0.07 K/uL    ASSESSMENT: [name] is a 87 y.o. female, 2 Days Post-Op s/p Procedure(s): 1. CPT 27245-Cephalomedullary nailing of left intertrochanteric femur fracture 2. CPT 20680-Removal of hardware left femur  CV/Blood loss: Hemoglobin 7.0 yesterday morning, CBC pending this AM. Received 1 unit PRBCs 04/24/20  PLAN: Weightbearing: WBAT LLE Incisional and dressing care: Ok to leave incisions open to air.  Showering: Okay to begin showering with assistance 04/26/2020.  Incisions may get wet at this point. Orthopedic device(s): None  Pain management:  1. Tylenol 650 mg q 6 hours scheduled 2. Robaxin 500 mg q 6 hours PRN 3. Percocet 5-325 mg q 4 hours PRN 4. Neurontin 100 mg TID 5. Dilaudid 0.5-1 mg q 3 hours PRN VTE  prophylaxis: Ok to restart Warfarin from ortho standpoint once Hgb stable, SCDs ID: Vancomycin post op completed Foley/Lines: No foley, KVO IVFs Impediments to Fracture Healing: Vitamin D level 26, continue D3 supplementation Dispo: Therapies as tolerated, currently recommending CIR. Consult for this has been placed. Recommending SNF if denied from CIR. Continue to monitor CBC Follow - up plan: 2 weeks after discharge for repeat x-rays  Contact information:  Katha Hamming MD, Patrecia Pace PA-C. After hours and holidays please check [email or web address] for group call information for [facility]   Aydon Swamy A. Ricci Barker, PA-C [phone] (office) [email or web address]

## 2020-04-25 NOTE — Progress Notes (Signed)
ANTICOAGULATION CONSULT NOTE - Initial Consult  Pharmacy Consult for Warfarin Indication: atrial fibrillation  Patient Measurements: Height: 5\' 4"  (162.6 cm) Weight: 78.9 kg (174 lb) IBW/kg (Calculated) : 54.7  Vital Signs: Temp: 97.5 F (36.4 C) (01/28 0502) Temp Source: Oral (01/28 0502) BP: 141/53 (01/28 0502) Pulse Rate: 81 (01/28 0502)  Labs: Recent Labs    04/23/20 0500 04/24/20 0827 04/24/20 1628 04/25/20 0741  HGB 8.9* 7.0* 7.3* 7.0*  HCT 28.3* 22.0* 23.2* 21.4*  PLT 246 210 188 175  LABPROT 17.6* 15.7*  --  15.1  INR 1.5* 1.3*  --  1.2  CREATININE 1.17* 1.85*  --  1.75*    Estimated Creatinine Clearance: 24.3 mL/min (A) (by C-G formula based on SCr of 1.75 mg/dL (H)).   Assessment: 64 YOF on warfarin PTA for hx Afib s/p L-femur fracture repair on 1/26. Pharmacy consulted to start Warfarin without a bridge when Hgb stable.   Hgb was to 7 post-op yesterday from 8.9. Warfarin initiation was held. Hgb stable at 7 this AM. Discussed with ortho Ricci Barker) who has okayed Warfarin to resume 1/28.   Vit K 5 mg given for reversal on 1/25. PTA dose was 5 mg daily EXCEPT for 7.5 mg on Tues/Thurs.  Goal of Therapy:  INR 2-3 Monitor platelets by anticoagulation protocol: Yes   Plan:  - Warfarin 7.5 mg x 1 dose at 1600 today - Daily PT/INR, CBC q72h - Will continue to monitor for any signs/symptoms of bleeding and will follow up with PT/INR in the a.m.    Thank you for allowing pharmacy to be a part of this patient's care.  Alycia Rossetti, PharmD, BCPS Clinical Pharmacist Clinical phone for 04/25/2020: [phone] 04/25/2020 9:21 AM   **Pharmacist phone directory can now be found on [email or web address] ([email or web address]).  Listed under [place].

## 2020-04-25 NOTE — Progress Notes (Signed)
Patient ID: [name], female   DOB: [birth year], 87 y.o.   MRN: [id number]  PROGRESS NOTE    [name]  MWN:[id number] DOB: 06/08/1933 DOA: 04/22/2020 PCP: Leanna Battles, MD    Brief Narrative:  [name] is a 87 y.o.femalewith medical history significant forparoxysmal atrial fibrillation chronically anticoagulated on warfarin, recurrent urinary tract infection, hypertension, hyperlipidemia,osteoarthritis status post bilateral knee replacements,who is admitted to [facility] on 1/25/2022with acute closed left intratrochanteric hip fractureafter presenting from home to [facility] complaining of left hip pain. Xray of left hip > mildly displaced and impacted intratrochanteric fracture of the proximal left femur.   Assessment & Plan:   Principal Problem:   Intertrochanteric fracture of left hip (HCC) Active Problems:   Paroxysmal atrial fibrillation (HCC)   Hypertension   Left hip pain   Leukocytosis   HLD (hyperlipidemia)   Confusion  Closed left hip fracture  - s/p intramedullary nail - management per ortho who is recommending to resume Warfarin. - pain control per ortho  Anemia due to acute blood loss - will transfuse 1 U PRBC as Hb has dropped to 7 today- consent received from husband as patient is quite sleepy  Confused?  - Somnolent on my exam - per husband, she is not at her baseline and this could be due to residual effects of anaesthesia vs due to pain medications or both - keep an eye on her mental status     Paroxysmal atrial fibrillation  - resuming Warfarin   - cont Toprol and follow   CKD 3a - AKI with metabolic acidosis - Cr 4.03 - has improved to 1.17 but subsequently has risen again to 1.41- this may be related to acute blood loss- follow  Vit D deficiency - replace    Hypertension  - Chlorthalidone and Losartan are on hold - cont Toprol for A-fib     Leukocytosis - possibly stress induced - UA is  negative - COVID is negative - no other signs of infection  - has resolved     HLD (hyperlipidemia) - cont Lipitor   DVT prophylaxis: [id number] on no bridge per ortho Code Status: Full code  Family Communication: husband at bedside Disposition Plan: CIR - awaiting bed--none likely over weekend   Consultants:   Ortho  Procedures:  Cephalomedullary nailing of left intertrochanteric femur fracture  Removal of hardware left femur  Antimicrobials: Anti-infectives (From admission, onward)   Start     Dose/Rate Route Frequency Ordered Stop   04/24/20 0130  vancomycin (VANCOCIN) IVPB 1000 mg/200 mL premix  Status:  Discontinued        1,000 mg 200 mL/hr over 60 Minutes Intravenous Every 12 hours 04/23/20 1824 04/24/20 1009   04/23/20 1435  vancomycin (VANCOCIN) powder  Status:  Discontinued          As needed 04/23/20 1436 04/23/20 1602   04/23/20 1415  vancomycin (VANCOCIN) IVPB 1000 mg/200 mL premix        1,000 mg 200 mL/hr over 60 Minutes Intravenous On call to O.R. 04/23/20 1352 04/23/20 1545   04/23/20 1404  vancomycin (VANCOCIN) 1-5 GM/200ML-% IVPB       Note to Pharmacy: Granville Lewis, Lindsi   : cabinet override      04/23/20 1404 04/24/20 0233       Subjective: Still sleepy per husband, he is refusing narcotics, though she is getting Robaxin and got oxycodone and dilaudid today.  Objective: Vitals:   04/24/20 1741 04/24/20 2137 04/25/20 0502 04/25/20  1509  BP: (!) 132/56 (!) 127/51 (!) 141/53 (!) 132/54  Pulse: 85 83 81 82  Resp:  16 16 17   Temp: 98.6 F (37 C) 98.7 F (37.1 C) (!) 97.5 F (36.4 C) 99.5 F (37.5 C)  TempSrc: Oral Oral Oral Oral  SpO2: 91% 93% 91% 95%  Weight:      Height:        Intake/Output Summary (Last 24 hours) at 04/25/2020 2001 Last data filed at 04/25/2020 1804 Gross per 24 hour  Intake 2001.91 ml  Output 1100 ml  Net 901.91 ml   Filed Weights   04/24/20 0900  Weight: 78.9 kg    Examination:  General exam: Appears calm  and comfortable  Respiratory system: Clear to auscultation. Respiratory effort normal. Cardiovascular system: S1 & S2 heard, RRR.  Gastrointestinal system: Abdomen is nondistended, soft and nontender.  Central nervous system: Alert and oriented. No focal neurological deficits. Somnolent Extremities: Symmetric  Skin: No rashes Psychiatry: Judgement and insight appear normal. Mood & affect appropriate.     Data Reviewed: I have personally reviewed following labs and imaging studies  CBC: Recent Labs  Lab 04/22/20 1650 04/23/20 0500 04/24/20 0827 04/24/20 1628 04/25/20 0741  WBC 11.7* 9.2 8.7 8.6 8.0  NEUTROABS 10.4* 7.1  --  6.1  --   HGB 10.1* 8.9* 7.0* 7.3* 7.0*  HCT 30.8* 28.3* 22.0* 23.2* 21.4*  MCV 88.5 87.9 89.1 87.5 87.3  PLT 269 246 210 188 0000000   Basic Metabolic Panel: Recent Labs  Lab 04/22/20 1650 04/23/20 0500 04/24/20 0827 04/25/20 0741  NA 139 135 136 138  K 4.1 3.8 4.2 4.3  CL 105 103 106 108  CO2 23 21* 21* 21*  GLUCOSE 165* 130* 135* 119*  BUN 30* 26* 29* 28*  CREATININE 1.46* 1.17* 1.85* 1.75*  CALCIUM 9.1 8.7* 8.1* 8.0*  MG  --  1.7  --   --    Coagulation Profile: Recent Labs  Lab 04/22/20 1650 04/23/20 0500 04/24/20 0827 04/25/20 0741  INR 2.3* 1.5* 1.3* 1.2   CBG: Recent Labs  Lab 04/23/20 1229  GLUCAP 145*     Recent Results (from the past 240 hour(s))  SARS CORONAVIRUS 2 (TAT 6-24 HRS) Nasopharyngeal Nasopharyngeal Swab     Status: None   Collection Time: 04/22/20  5:21 PM   Specimen: Nasopharyngeal Swab  Result Value Ref Range Status   SARS Coronavirus 2 NEGATIVE NEGATIVE Final    Comment: (NOTE) SARS-CoV-2 target nucleic acids are NOT DETECTED.  The SARS-CoV-2 RNA is generally detectable in upper and lower respiratory specimens during the acute phase of infection. Negative results do not preclude SARS-CoV-2 infection, do not rule out co-infections with other pathogens, and should not be used as the sole basis for  treatment or other patient management decisions. Negative results must be combined with clinical observations, patient history, and epidemiological information. The expected result is Negative.  Fact Sheet for Patients: SugarRoll.be  Fact Sheet for Healthcare Providers: https://www.woods-mathews.com/  This test is not yet approved or cleared by the [place] FDA and  has been authorized for detection and/or diagnosis of SARS-CoV-2 by FDA under an Emergency Use Authorization (EUA). This EUA will remain  in effect (meaning this test can be used) for the duration of the COVID-19 declaration under Se ction 564(b)(1) of the Act, 21 U.S.C. section 360bbb-3(b)(1), unless the authorization is terminated or revoked sooner.  Performed at [facility], [place] [address] [place], [place] [address]  Surgical pcr screen     Status: None   Collection Time: 04/23/20  1:54 PM   Specimen: Nasal Mucosa; Nasal Swab  Result Value Ref Range Status   MRSA, PCR NEGATIVE NEGATIVE Final   Staphylococcus aureus NEGATIVE NEGATIVE Final    Comment: (NOTE) The Xpert SA Assay (FDA approved for NASAL specimens in patients 60 years of age and older), is one component of a comprehensive surveillance program. It is not intended to diagnose infection nor to guide or monitor treatment. Performed at [facility], [place] [address] [place], [place] [address]       Scheduled Meds: . atorvastatin  20 mg Oral QPC supper  . cholecalciferol  1,000 Units Oral Daily  . docusate sodium  100 mg Oral BID  . metoprolol succinate  25 mg Oral Daily  . pantoprazole (PROTONIX) IV  40 mg Intravenous Q24H  . Warfarin - Pharmacist Dosing Inpatient   Does not apply q1600   Continuous Infusions: . 0.9 % NaCl with KCl 20 mEq / L 75 mL/hr at 04/25/20 1809  . methocarbamol (ROBAXIN) IV       LOS: 3 days    Donnamae Jude, MD 04/25/2020 8:01 PM [phone] [facility] If 7PM-7AM, please contact night-coverage 04/25/2020, 8:01 PM

## 2020-04-25 NOTE — Progress Notes (Signed)
Inpatient Rehab Admissions:  Inpatient Rehab Consult received.  Left voicemail for spouse to discuss CIR recommendations.  Will continue to follow.  Will not have a bed for patient through the weekend.    Signed: Shann Medal, PT, DPT Admissions Coordinator [phone] 04/25/20  11:39 AM

## 2020-04-25 NOTE — Progress Notes (Signed)
Patient refused morning labs at this time. Phlebotomy asked to try again later in the morning.   Milagros Loll, RN.

## 2020-04-25 NOTE — H&P (Incomplete)
Physical Medicine and Rehabilitation Admission H&P    Chief Complaint  Patient presents with  . Fall  : HPI: [name] is an 87 year old right-handed female with history of atrial fibrillation on chronic Coumadin, CKD stage III with baseline 1.46, breast cancer with bilateral mastectomy 1990s, hearing impaired, hypertension, TIA without residual weakness, recurrent UTI, hyperlipidemia, bilateral total knee replacements as well as left femur fracture May 2019 with ORIF,.  Per chart review patient lives with spouse.  1 level home with ramped entrance.  Ambulates with a rolling walker.  Requires some assistance for lower body ADLs.  Presented 04/22/2020 after a fall without loss of consciousness landing on her left hip.  She stated she leaned too far forward with falling out of her wheelchair.  Admission chemistries hemoglobin 10.1 WBC 11,700, BUN 30, creatinine 1.46, urinalysis negative nitrite, INR 2.3.  X-rays and imaging revealed left intertrochanteric femur fracture as well as healed distal femur fracture.  Patient did receive vitamin K to reverse Coumadin.  Underwent nailing of left intertrochanteric femur fracture removal of hardware left femur 04/23/2020 per Dr. Doreatha Martin.  Weightbearing as tolerated left lower extremity.  Postoperative chronic Coumadin resumed.  Acute blood loss anemia 7.0 she did receive 1 unit packed red blood cells latest hemoglobin 6.8.Marland Kitchen  Creatinine remained stable 1.75 from baseline 1.46.  On 04/27/2020 patient with altered mental status reportedly having difficulty swallowing.  MRI of the brain showed small acute infarct in the right cerebellum bilaterally occipital parietal lobes and high left precentral gyrus.  Patient did not receive TPA.  MRA of the head and neck showed no large vessel occlusion.  Echocardiogram showed ejection fraction of 70 to 75% no regional wall motion abnormalities.  Patient's chronic Coumadin was placed on hold currently maintained on aspirin 325 mg  daily with plan to hold Coumadin x2 days for risk of hemorrhagic conversion and then restart without bridging..  Tolerating a regular diet.  Therapy evaluations completed due to patient decreased functional ability was admitted for a comprehensive rehab program.  Review of Systems  Constitutional: Negative for chills and fever.  HENT: Positive for hearing loss.   Eyes: Negative for blurred vision and double vision.  Respiratory: Negative for cough and shortness of breath.   Cardiovascular: Positive for palpitations and leg swelling. Negative for chest pain.  Gastrointestinal: Positive for diarrhea. Negative for constipation, heartburn and nausea.       GERD  Genitourinary: Negative for dysuria, flank pain and hematuria.  Musculoskeletal: Positive for joint pain and myalgias.  Skin: Negative for rash.  Psychiatric/Behavioral: Positive for depression.  All other systems reviewed and are negative.  Past Medical History:  Diagnosis Date  . Arthritis    "fingers, hips, feet, ankles" (11/10/2012)  . Asthma   . Atrial fibrillation (HCC)    CHRONIC COUMADIN  . Breast cancer ([place]) 1990's   "cancer on one side; precancerous tissue on the other" (11/10/2012)  . Chronic bronchitis ([place])    "multiple times; not in a long time" (11/10/2012  . Depression   . GERD (gastroesophageal reflux disease)   . Hearing impaired   . Hypertension   . Irritable bowel   . Lymphedema    HX OF - IN LEFT ARM--NO NEEDLES OR B/P'S LEFT ARM  . Macular degeneration    "BEGINNINGS OF" MACULAR DEGENERATION  . Osteoporosis   . Pneumonia    "multiple times; not in a long time" (11/10/2012)  . Recurrent UTI   . Sleep apnea    "  dx'd w/it; don't wear mask or anything" (11/10/2012)  . Stroke ([place]) ~ 2005   HX OF TIA-NO RESIDUAL PROBLEM--PARALYSIS RT SIDE FACE /PT'S MOUTH DROOPS-AND LOSS OF HEARING RT EAR --SINCE EAR SURGERY AS A CHILD   . UTI (lower urinary tract infection)    FREQUENT   Past Surgical History:   Procedure Laterality Date  . BREAST BIOPSY Bilateral 1990's  . Dacryocystorhinostomy  02/18/2016  . INNER EAR SURGERY Right    MULTIPLE EAR SURGERIES,  . INTRAMEDULLARY (IM) NAIL INTERTROCHANTERIC Left 04/23/2020   Procedure: INTRAMEDULLARY (IM) NAIL INTERTROCHANTRIC;  Surgeon: Shona Needles, MD;  Location: [facility];  Service: Orthopedics;  Laterality: Left;  . JOINT REPLACEMENT    . KNEE ARTHROSCOPY  04/07/2012   Procedure: ARTHROSCOPY KNEE;  Surgeon: Gearlean Alf, MD;  Location: [facility];  Service: Orthopedics;  Laterality: Left;  WITH SYNOVECTOMY  . MASTECTOMY Bilateral 1990's  . MASTOIDECTOMY Right 1942  . ORIF FEMUR FRACTURE Left 08/08/2017   Procedure: OPEN REDUCTION INTERNAL FIXATION (ORIF) DISTAL FEMUR FRACTURE;  Surgeon: Shona Needles, MD;  Location: [facility];  Service: Orthopedics;  Laterality: Left;  . TONSILLECTOMY    . TOTAL KNEE ARTHROPLASTY  09/20/2011   LEFT TOTAL KNEE ARTHROPLASTY;  Surgeon: Gearlean Alf, MD;  Location: [facility];  Service: Orthopedics;  Laterality: Left;  . TOTAL KNEE ARTHROPLASTY  2006   Family History  Problem Relation Age of Onset  . CAD Mother   . Stroke Mother   . CAD Father    Social History:  reports that she has never smoked. She has never used smokeless tobacco. She reports that she does not drink alcohol and does not use drugs. Allergies:  Allergies  Allergen Reactions  . Pradaxa [Dabigatran Etexilate Mesylate] Other (See Comments)    INTERNAL BLEEDING  . Clindamycin/Lincomycin Other (See Comments)    PT STATES HER DOCTOR TOLD HER NOT TO TAKE CLINDAMYCIN BECAUSE SHE GOT C-DIFF AFTER TAKING AMPICILLIN- "tolerated in 2022" (per spouse)  . Latex Other (See Comments)    Blisters   . Ampicillin Other (See Comments)    C-DIF AFTER TAKING AMPICILLIN- "tolerated in 2022" (per husband) Has patient had a PCN reaction causing immediate rash, facial/tongue/throat swelling, SOB or lightheadedness with hypotension: No Has patient  had a PCN reaction causing severe rash involving mucus membranes or skin necrosis: No Has patient had a PCN reaction that required hospitalization: No Has patient had a PCN reaction occurring within the last 10 years: No If all of the above answers are "NO", then may proceed with Cephalosporin u  . Sulfamethoxazole Diarrhea   Medications Prior to Admission  Medication Sig Dispense Refill  . atorvastatin (LIPITOR) 20 MG tablet Take 20 mg by mouth daily after supper.     . chlorthalidone (HYGROTON) 50 MG tablet Take 50 mg by mouth daily.     Marland Kitchen estradiol (ESTRACE) 0.1 MG/GM vaginal cream Place 1 Applicatorful vaginally 2 (two) times a week.    . loperamide (IMODIUM) 2 MG capsule Take 2-4 mg by mouth 5 (five) times daily as needed for diarrhea or loose stools.     Marland Kitchen losartan (COZAAR) 100 MG tablet Take 100 mg by mouth daily.    . metoprolol succinate (TOPROL-XL) 25 MG 24 hr tablet Take 1 tablet (25 mg total) by mouth daily. 30 tablet 11  . Multiple Vitamins-Minerals (ONE-A-DAY PROACTIVE 65+) TABS Take 1 tablet by mouth daily with breakfast.    . Multiple Vitamins-Minerals (PRESERVISION AREDS) CAPS Take  1 capsule by mouth 2 (two) times daily.    Vladimir Faster Glyc-Propyl Glyc PF (SYSTANE ULTRA PF) 0.4-0.3 % SOLN Place 1 drop into both eyes at bedtime.    . Probiotic Product (VSL#3 PO) Take 1 capsule by mouth in the morning.    . saccharomyces boulardii (FLORASTOR) 250 MG capsule Take 500 mg by mouth every evening.    . warfarin (COUMADIN) 5 MG tablet Take 5-7.5 mg by mouth See admin instructions. Take 5 mg by mouth with supper (evening meal) on Sun/Mon/Wed/Fri/Sat and 7.5 mg on Tues/Thurs      Drug Regimen Review Drug regimen was reviewed and remains appropriate with no significant issues identified  Home: Home Living Family/patient expects to be discharged to:: Private residence Living Arrangements: Spouse/significant other Available Help at Discharge: Family,Available 24 hours/day Type of  Home: House Home Access: Ramped entrance Home Layout: One level Bathroom Shower/Tub: Multimedia programmer: Handicapped height Home Equipment: Environmental consultant - 2 wheels,Bedside commode,Shower seat,Toilet riser,Hand held shower head,Wheelchair - manual,Other (comment) (lift chair)   Functional History: Prior Function Level of Independence: Needs assistance Gait / Transfers Assistance Needed: ambulates with RW at baseline ADL's / Homemaking Assistance Needed: requires assistance for LB dressing and IADL  Functional Status:  Mobility: Bed Mobility Overal bed mobility: Needs Assistance Bed Mobility: Rolling,Supine to Sit,Sit to Supine Rolling: Total assist,+2 for physical assistance,+2 for safety/equipment Supine to sit: Total assist,+2 for physical assistance,+2 for safety/equipment Sit to supine: Total assist,+2 for physical assistance,+2 for safety/equipment General bed mobility comments: assist for all aspects Transfers Overall transfer level: Needs assistance Equipment used: Rolling walker (2 wheeled) Transfers: Sit to/from Stand Sit to Stand: Max assist,+2 physical assistance,+2 safety/equipment,From elevated surface General transfer comment: x 2 from elevated bed with use of bed pad under hips      ADL: ADL General ADL Comments: total assist due to lethargy, incontinent of bowel  Cognition: Cognition Overall Cognitive Status: Difficult to assess Orientation Level: Oriented to person Cognition Arousal/Alertness: Lethargic Behavior During Therapy: Flat affect Overall Cognitive Status: Difficult to assess Area of Impairment: Attention,Following commands Current Attention Level: Selective Following Commands: Follows one step commands with increased time,Follows multi-step commands inconsistently General Comments: difficult to assess due to lethargy and HOH. Husband reports she is talkative at baseline, however this session patient said little to no words  Physical  Exam: Blood pressure (!) 141/53, pulse 81, temperature (!) 97.5 F (36.4 C), temperature source Oral, resp. rate 16, height 5\' 4"  (1.626 m), weight 78.9 kg, SpO2 91 %. Physical Exam Skin:    Comments: Left hip incision is dressed appropriately tender.  Neurological:     Comments: Patient is alert in no acute distress oriented x3 and follows commands.     Results for orders placed or performed during the hospital encounter of 04/22/20 (from the past 48 hour(s))  CBG monitoring, ED     Status: Abnormal   Collection Time: 04/23/20 12:29 PM  Result Value Ref Range   Glucose-Capillary 145 (H) 70 - 99 mg/dL    Comment: Glucose reference range applies only to samples taken after fasting for at least 8 hours.  Surgical pcr screen     Status: None   Collection Time: 04/23/20  1:54 PM   Specimen: Nasal Mucosa; Nasal Swab  Result Value Ref Range   MRSA, PCR NEGATIVE NEGATIVE   Staphylococcus aureus NEGATIVE NEGATIVE    Comment: (NOTE) The Xpert SA Assay (FDA approved for NASAL specimens in patients 55 years of age and older),  is one component of a comprehensive surveillance program. It is not intended to diagnose infection nor to guide or monitor treatment. Performed at [facility], [place] [address] [place], [place] [address]   Protime-INR     Status: Abnormal   Collection Time: 04/24/20  8:27 AM  Result Value Ref Range   Prothrombin Time 15.7 (H) 11.4 - 15.2 seconds   INR 1.3 (H) 0.8 - 1.2    Comment: (NOTE) INR goal varies based on device and disease states. Performed at [facility], [place] [address], [place], [place] [address]   VITAMIN D 25 Hydroxy (Vit-D Deficiency, Fractures)     Status: Abnormal   Collection Time: 04/24/20  8:27 AM  Result Value Ref Range   Vit D, 25-Hydroxy 24.50 (L) 30 - 100 ng/mL    Comment: (NOTE) Vitamin D deficiency has been defined by the Institute of Medicine  and an Endocrine Society practice guideline as a level of serum 25-OH  vitamin  D less than 20 ng/mL (1,2). The Endocrine Society went on to  further define vitamin D insufficiency as a level between 21 and 29  ng/mL (2).  1. IOM (Institute of Medicine). 2010. Dietary reference intakes for  calcium and D. [place]: The [employer]. 2. Holick MF, Binkley [place], Bischoff-Ferrari HA, et al. Evaluation,  treatment, and prevention of vitamin D deficiency: an Endocrine  Society clinical practice guideline, JCEM. 2011 Jul; 96(7): 1911-30.  Performed at [facility], [place] [address] [place], [place] [address]   CBC     Status: Abnormal   Collection Time: 04/24/20  8:27 AM  Result Value Ref Range   WBC 8.7 4.0 - 10.5 K/uL   RBC 2.47 (L) 3.87 - 5.11 MIL/uL   Hemoglobin 7.0 (L) 12.0 - 15.0 g/dL   HCT 22.0 (L) 36.0 - 46.0 %   MCV 89.1 80.0 - 100.0 fL   MCH 28.3 26.0 - 34.0 pg   MCHC 31.8 30.0 - 36.0 g/dL   RDW 13.8 11.5 - 15.5 %   Platelets 210 150 - 400 K/uL   nRBC 0.0 0.0 - 0.2 %    Comment: Performed at [facility], [place] [address] [place], [place] [id number]  Basic metabolic panel     Status: Abnormal   Collection Time: 04/24/20  8:27 AM  Result Value Ref Range   Sodium 136 135 - 145 mmol/L   Potassium 4.2 3.5 - 5.1 mmol/L   Chloride 106 98 - 111 mmol/L   CO2 21 (L) 22 - 32 mmol/L   Glucose, Bld 135 (H) 70 - 99 mg/dL    Comment: Glucose reference range applies only to samples taken after fasting for at least 8 hours.   BUN 29 (H) 8 - 23 mg/dL   Creatinine, Ser 1.85 (H) 0.44 - 1.00 mg/dL   Calcium 8.1 (L) 8.9 - 10.3 mg/dL   GFR, Estimated 27 (L) >60 mL/min    Comment: (NOTE) Calculated using the CKD-EPI Creatinine Equation (2021)    Anion gap 9 5 - 15    Comment: Performed at [facility] [address] [place], [place] [address]  Prepare RBC (crossmatch)     Status: None   Collection Time: 04/24/20 10:21 AM  Result Value Ref Range   Order Confirmation      ORDER PROCESSED BY BLOOD BANK Performed at [facility], [place] [address] [place], [place] [address]   CBC with  Differential/Platelet     Status: Abnormal   Collection Time: 04/24/20  4:28 PM  Result Value Ref Range   WBC 8.6 4.0 - 10.5 K/uL   RBC 2.65 (L) 3.87 - 5.11 MIL/uL   Hemoglobin 7.3 (L) 12.0 - 15.0 g/dL   HCT 23.2 (L) 36.0 - 46.0 %   MCV 87.5 80.0 - 100.0 fL   MCH 27.5 26.0 - 34.0 pg   MCHC 31.5 30.0 - 36.0 g/dL   RDW 13.9 11.5 - 15.5 %   Platelets 188 150 - 400 K/uL   nRBC 0.0 0.0 - 0.2 %   Neutrophils Relative % 72 %   Neutro Abs 6.1 1.7 - 7.7 K/uL   Lymphocytes Relative 14 %   Lymphs Abs 1.2 0.7 - 4.0 K/uL   Monocytes Relative 14 %   Monocytes Absolute 1.2 (H) 0.1 - 1.0 K/uL   Eosinophils Relative 0 %   Eosinophils Absolute 0.0 0.0 - 0.5 K/uL   Basophils Relative 0 %   Basophils Absolute 0.0 0.0 - 0.1 K/uL   Immature Granulocytes 0 %   Abs Immature Granulocytes 0.03 0.00 - 0.07 K/uL    Comment: Performed at [facility], [address] [place], [place] [id number]  Basic metabolic panel     Status: Abnormal   Collection Time: 04/25/20  7:41 AM  Result Value Ref Range   Sodium 138 135 - 145 mmol/L   Potassium 4.3 3.5 - 5.1 mmol/L   Chloride 108 98 - 111 mmol/L   CO2 21 (L) 22 - 32 mmol/L   Glucose, Bld 119 (H) 70 - 99 mg/dL    Comment: Glucose reference range applies only to samples taken after fasting for at least 8 hours.   BUN 28 (H) 8 - 23 mg/dL   Creatinine, Ser 1.75 (H) 0.44 - 1.00 mg/dL   Calcium 8.0 (L) 8.9 - 10.3 mg/dL   GFR, Estimated 28 (L) >60 mL/min    Comment: (NOTE) Calculated using the CKD-EPI Creatinine Equation (2021)    Anion gap 9 5 - 15    Comment: Performed at [facility] [address], [place], [place] [address]  CBC     Status: Abnormal   Collection Time: 04/25/20  7:41 AM  Result Value Ref Range   WBC 8.0 4.0 - 10.5 K/uL   RBC 2.45 (L) 3.87 - 5.11 MIL/uL   Hemoglobin 7.0 (L) 12.0 - 15.0 g/dL   HCT 21.4 (L) 36.0 - 46.0 %   MCV 87.3 80.0 - 100.0 fL   MCH 28.6 26.0 - 34.0  pg   MCHC 32.7 30.0 - 36.0 g/dL   RDW 14.3 11.5 - 15.5 %   Platelets 175 150 - 400 K/uL   nRBC 0.0 0.0 - 0.2 %    Comment: Performed at [facility], [place] [address] [place], [place] [address]  Protime-INR     Status: None   Collection Time: 04/25/20  7:41 AM  Result Value Ref Range   Prothrombin Time 15.1 11.4 - 15.2 seconds   INR 1.2 0.8 - 1.2    Comment: (NOTE) INR goal varies based on device and disease states. Performed at [facility], [place] [address] [place],  [address]    DG C-Arm 1-60 Min  Result Date: 04/23/2020 CLINICAL DATA:  Left intratrochanteric hip fracture EXAM: DG C-ARM 1-60 MIN; OPERATIVE LEFT HIP WITH PELVIS COMPARISON:  04/22/2020 FLUOROSCOPY TIME:  Fluoroscopy Time:  1 minutes 59 seconds Radiation  Exposure Index (if provided by the fluoroscopic device): 23.44 mGy Number of Acquired Spot Images: 6 FINDINGS: Fixation sideplate is again noted along the mid to distal femur. Fracture fragments have been reduced initially with placement of a proximal medullary rod with 2 fixation screws traversing the femoral neck. Fracture fragments are in near anatomic alignment. IMPRESSION: ORIF of proximal left femur fracture. Electronically Signed   By: Inez Catalina M.D.   On: 04/23/2020 16:07   DG HIP PORT UNILAT W OR W/O PELVIS 1V LEFT  Result Date: 04/23/2020 CLINICAL DATA:  Postop ORIF for left hip fracture. EXAM: DG HIP (WITH OR WITHOUT PELVIS) 1V PORT LEFT COMPARISON:  Radiographs 04/22/2020. Intraoperative view is earlier today. FINDINGS: Status post ORIF of the intertrochanteric left femur fracture with a intramedullary nail and 2 fixation screws traversing the femoral neck. The hardware is well positioned. There is improved alignment of the main fracture fragments. Pre-existing lateral femoral diaphyseal plate and screws are incompletely visualized, but appear intact. Two of the proximal screws from that plate have been removed in the interval. IMPRESSION:  Improved alignment of intertrochanteric left femur fracture status post ORIF. Electronically Signed   By: Richardean Sale M.D.   On: 04/23/2020 16:57   DG HIP OPERATIVE UNILAT W OR W/O PELVIS LEFT  Result Date: 04/23/2020 CLINICAL DATA:  Left intratrochanteric hip fracture EXAM: DG C-ARM 1-60 MIN; OPERATIVE LEFT HIP WITH PELVIS COMPARISON:  04/22/2020 FLUOROSCOPY TIME:  Fluoroscopy Time:  1 minutes 59 seconds Radiation Exposure Index (if provided by the fluoroscopic device): 23.44 mGy Number of Acquired Spot Images: 6 FINDINGS: Fixation sideplate is again noted along the mid to distal femur. Fracture fragments have been reduced initially with placement of a proximal medullary rod with 2 fixation screws traversing the femoral neck. Fracture fragments are in near anatomic alignment. IMPRESSION: ORIF of proximal left femur fracture. Electronically Signed   By: Inez Catalina M.D.   On: 04/23/2020 16:07       Medical Problem List and Plan: 1.  Decreased functional ability secondary to left intertrochanteric femur fracture as well as history of left femur fracture May 2019.  Status post IM nailing as well as removal of hardware left femur from previous fracture 04/23/2020.  Weightbearing as tolerated.  -patient may *** shower  -ELOS/Goals: *** 2.  Antithrombotics: -DVT/anticoagulation: Chronic Coumadin  -antiplatelet therapy: N/A 3. Pain Management: Oxycodone as needed, Robaxin as needed 4. Mood: Provide emotional support  -antipsychotic agents: N/A 5. Neuropsych: This patient is capable of making decisions on her own behalf. 6.  Acute blood loss anemia.  Follow-up CBC 7.  Atrial fibrillation.  Toprol-XL 25 mg daily.  Continue Coumadin.  Cardiac rate controlled 8.  CKD stage III.  Baseline creatinine 1.46.  Follow-up chemistries 9.  Hyperlipidemia.  Lipitor 10.Marland Kitchen  Recurrent UTIs.  Latest urinalysis study negative.  Denies dysuria hematuria 6. Skin/Wound Care: *** 7. Fluids/Electrolytes/Nutrition:  ***     ***  Lavon Paganini Angiulli, PA-C 04/25/2020

## 2020-04-26 ENCOUNTER — Inpatient Hospital Stay (HOSPITAL_COMMUNITY): Payer: Medicare Other

## 2020-04-26 DIAGNOSIS — S72145A Nondisplaced intertrochanteric fracture of left femur, initial encounter for closed fracture: Secondary | ICD-10-CM | POA: Diagnosis not present

## 2020-04-26 LAB — CBC
HCT: 22.6 % — ABNORMAL LOW (ref 36.0–46.0)
Hemoglobin: 7 g/dL — ABNORMAL LOW (ref 12.0–15.0)
MCH: 28 pg (ref 26.0–34.0)
MCHC: 31 g/dL (ref 30.0–36.0)
MCV: 90.4 fL (ref 80.0–100.0)
Platelets: 189 10*3/uL (ref 150–400)
RBC: 2.5 MIL/uL — ABNORMAL LOW (ref 3.87–5.11)
RDW: 14.1 % (ref 11.5–15.5)
WBC: 9 10*3/uL (ref 4.0–10.5)
nRBC: 0 % (ref 0.0–0.2)

## 2020-04-26 LAB — BASIC METABOLIC PANEL
Anion gap: 7 (ref 5–15)
BUN: 30 mg/dL — ABNORMAL HIGH (ref 8–23)
CO2: 20 mmol/L — ABNORMAL LOW (ref 22–32)
Calcium: 7.7 mg/dL — ABNORMAL LOW (ref 8.9–10.3)
Chloride: 111 mmol/L (ref 98–111)
Creatinine, Ser: 1.48 mg/dL — ABNORMAL HIGH (ref 0.44–1.00)
GFR, Estimated: 35 mL/min — ABNORMAL LOW (ref >60)
Glucose, Bld: 136 mg/dL — ABNORMAL HIGH (ref 70–99)
Potassium: 4.7 mmol/L (ref 3.5–5.1)
Sodium: 138 mmol/L (ref 135–145)

## 2020-04-26 LAB — PROTIME-INR
INR: 1.2 (ref 0.8–1.2)
Prothrombin Time: 14.8 seconds (ref 11.4–15.2)

## 2020-04-26 MED ORDER — HYDRALAZINE HCL 20 MG/ML IJ SOLN
10.0000 mg | Freq: Four times a day (QID) | INTRAMUSCULAR | Status: DC | PRN
  Administered 2020-04-26 – 2020-04-27 (×3): 10 mg via INTRAVENOUS
  Filled 2020-04-26 (×3): qty 1

## 2020-04-26 MED ORDER — VITAMIN D (ERGOCALCIFEROL) 1.25 MG (50000 UNIT) PO CAPS
50000.0000 [IU] | ORAL_CAPSULE | ORAL | Status: DC
  Administered 2020-04-26: 50000 [IU] via ORAL
  Filled 2020-04-26: qty 1

## 2020-04-26 MED ORDER — NYSTATIN 100000 UNIT/ML MT SUSP
5.0000 mL | Freq: Four times a day (QID) | OROMUCOSAL | Status: DC
  Administered 2020-04-26 – 2020-04-29 (×13): 500000 [IU] via ORAL
  Filled 2020-04-26 (×13): qty 5

## 2020-04-26 MED ORDER — WARFARIN SODIUM 5 MG PO TABS
5.0000 mg | ORAL_TABLET | Freq: Once | ORAL | Status: AC
  Administered 2020-04-26: 5 mg via ORAL
  Filled 2020-04-26: qty 1

## 2020-04-26 MED ORDER — SODIUM CHLORIDE 0.9 % IV SOLN: INTRAVENOUS | Status: DC

## 2020-04-26 MED ORDER — PHENOL 1.4 % MT LIQD
1.0000 | OROMUCOSAL | Status: DC | PRN
  Administered 2020-04-26: 1 via OROMUCOSAL
  Filled 2020-04-26: qty 177

## 2020-04-26 NOTE — Progress Notes (Signed)
Inpatient Rehab Admissions Coordinator:   Met with patient and husband at bedside to discuss potential CIR admission. Pt. Stated interest. Will pursue for potential admit this week, pending bed availability.   Clemens Catholic, [place], [place] Admissions Coordinator  [phone] ([place]) [phone] (office)

## 2020-04-26 NOTE — Evaluation (Signed)
Clinical/Bedside Swallow Evaluation Patient Details  Name: [name] MRN: [id number] Date of Birth: February 16, 1934  Today's Date: 04/26/2020 Time: SLP Start Time (ACUTE ONLY): 6295 SLP Stop Time (ACUTE ONLY): 1605 SLP Time Calculation (min) (ACUTE ONLY): 27 min  Past Medical History:  Past Medical History:  Diagnosis Date  . Arthritis    "fingers, hips, feet, ankles" (11/10/2012)  . Asthma   . Atrial fibrillation (HCC)    CHRONIC COUMADIN  . Breast cancer ([place]) 1990's   "cancer on one side; precancerous tissue on the other" (11/10/2012)  . Chronic bronchitis ([place])    "multiple times; not in a long time" (11/10/2012  . Depression   . GERD (gastroesophageal reflux disease)   . Hearing impaired   . Hypertension   . Irritable bowel   . Lymphedema    HX OF - IN LEFT ARM--NO NEEDLES OR B/P'S LEFT ARM  . Macular degeneration    "BEGINNINGS OF" MACULAR DEGENERATION  . Osteoporosis   . Pneumonia    "multiple times; not in a long time" (11/10/2012)  . Recurrent UTI   . Sleep apnea    "dx'd w/it; don't wear mask or anything" (11/10/2012)  . Stroke ([place]) ~ 2005   HX OF TIA-NO RESIDUAL PROBLEM--PARALYSIS RT SIDE FACE /PT'S MOUTH DROOPS-AND LOSS OF HEARING RT EAR --SINCE EAR SURGERY AS A CHILD   . UTI (lower urinary tract infection)    FREQUENT   Past Surgical History:  Past Surgical History:  Procedure Laterality Date  . BREAST BIOPSY Bilateral 1990's  . Dacryocystorhinostomy  02/18/2016  . INNER EAR SURGERY Right    MULTIPLE EAR SURGERIES,  . INTRAMEDULLARY (IM) NAIL INTERTROCHANTERIC Left 04/23/2020   Procedure: INTRAMEDULLARY (IM) NAIL INTERTROCHANTRIC;  Surgeon: Shona Needles, MD;  Location: [facility];  Service: Orthopedics;  Laterality: Left;  . JOINT REPLACEMENT    . KNEE ARTHROSCOPY  04/07/2012   Procedure: ARTHROSCOPY KNEE;  Surgeon: Gearlean Alf, MD;  Location: [facility];  Service: Orthopedics;  Laterality: Left;  WITH SYNOVECTOMY  . MASTECTOMY Bilateral  1990's  . MASTOIDECTOMY Right 1942  . ORIF FEMUR FRACTURE Left 08/08/2017   Procedure: OPEN REDUCTION INTERNAL FIXATION (ORIF) DISTAL FEMUR FRACTURE;  Surgeon: Shona Needles, MD;  Location: [facility];  Service: Orthopedics;  Laterality: Left;  . TONSILLECTOMY    . TOTAL KNEE ARTHROPLASTY  09/20/2011   LEFT TOTAL KNEE ARTHROPLASTY;  Surgeon: Gearlean Alf, MD;  Location: [facility];  Service: Orthopedics;  Laterality: Left;  . TOTAL KNEE ARTHROPLASTY  20078   HPI:  87 year old admitted to [facility] 04/22/2020 with acute closed left intratrochanteric hip fracture after presenting from home complaining with left hip pain. Pt underwent cephalomedullary nailing of left left intertrochanteric femur fracture and removal of hardware left femur on 04/23/2020.  PMH significant for PAF on coumadin, recurrent UTI, HTN, HLD aX ray of left hip showed Mildly displaced and impacted intratrochanteric fracture of left femur. Per MD note, pt complained of sore throat, choking with water and difficulty swallowing food.   Assessment / Plan / Recommendation Clinical Impression  Pt presents with s/s aspiration, decreased secretion management and odonophagia. Spouse reports pt ate pizza with last nights dinner without much difficulty. On arrival her vocal quality was wet/grugly, coughing and expectorating mucous. She is having odonophagia, grimacing with swallows and much encouragement needed to consume any po's. She and spouse deny previous dysphagia. 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 There were no other obvious focal deficits. Pharyngeal dysphagia etiology unclear if fully due to odonophagia or suspicion of neurological component. Notified MD of results who is ordering MRI. Recommend she have cup sips water only and floor stock bites puree, meds in puree if desired- stop if concerning coughing present. ST will plan to return  tomorrow. SLP Visit Diagnosis: Dysphagia, unspecified (R13.10)    Aspiration Risk  Moderate aspiration risk    Diet Recommendation Other (Comment);Thin liquid (water only, floor stock puree)   Liquid Administration via: Cup Medication Administration: Crushed with puree Supervision: Patient able to self feed Compensations: Slow rate;Small sips/bites;Minimize environmental distractions;Clear throat intermittently Postural Changes: Seated upright at 90 degrees    Other  Recommendations Oral Care Recommendations: Oral care QID   Follow up Recommendations  (TBD)      Frequency and Duration min 2x/week  2 weeks       Prognosis Prognosis for Safe Diet Advancement: Good      Swallow Study   General HPI: 87 year old admitted to [facility] 04/22/2020 with acute closed left intratrochanteric hip fracture after presenting from home complaining with left hip pain. Pt underwent cephalomedullary nailing of left left intertrochanteric femur fracture and removal of hardware left femur on 04/23/2020.  PMH significant for PAF on coumadin, recurrent UTI, HTN, HLD aX ray of left hip showed Mildly displaced and impacted intratrochanteric fracture of left femur. Per MD note, pt complained of sore throat, choking with water and difficulty swallowing food. Type of Study: Bedside Swallow Evaluation Previous Swallow Assessment: none Diet Prior to this Study: NPO Temperature Spikes Noted: No Respiratory Status: Room air History of Recent Intubation: Yes Length of Intubations (days):  (surgery) Date extubated: 04/23/20 Behavior/Cognition: Alert;Requires cueing Oral Cavity Assessment: Other (comment) (minimal candidias?) Oral Care Completed by SLP: Yes Vision: Functional for self-feeding Self-Feeding Abilities: Able to feed self Patient Positioning: Upright in bed Baseline Vocal Quality: Normal Volitional Cough: Strong Volitional Swallow: Able to elicit    Oral/Motor/Sensory Function Overall Oral  Motor/Sensory Function: Mild impairment Facial ROM: Reduced right Facial Symmetry: Abnormal symmetry right Facial Strength: Reduced right Lingual ROM: Within Functional Limits Lingual Symmetry: Within Functional Limits Lingual Strength: Within Functional Limits   Ice Chips Ice chips: Not tested   Thin Liquid Thin Liquid: Impaired Presentation: Cup;Straw Pharyngeal  Phase Impairments: Cough - Immediate;Throat Clearing - Delayed;Wet Vocal Quality    Nectar Thick Nectar Thick Liquid: Not tested   Honey Thick Honey Thick Liquid: Not tested   Puree Puree: Impaired Pharyngeal Phase Impairments: Wet Vocal Quality;Throat Clearing - Delayed   Solid     Solid: Not tested      Houston Siren 04/26/2020,6:18 PM   Orbie Pyo Colvin Caroli.Ed Risk analyst [phone] Office [phone]

## 2020-04-26 NOTE — Progress Notes (Addendum)
PROGRESS NOTE    [name]  YBO:[id number] DOB: 02-21-1934 DOA: 04/22/2020 PCP: Leanna Battles, MD   Brief Narrative: 87 year old with PMH significant for PAF on coumadin, recurrent UTI, HTN, HLD admitted to [facility] 04/22/2020 with acute closed left intratrochanteric hip fracture after presenting from home complaining with left hip pain.  X ray of left hip showed Mildly displaced and impacted intratrochanteric fracture of left femur.   Underwent cephalomedullary nailing of left left intertrochanteric femur fracture and removal of hardware left femur on 04/23/2020.   Assessment & Plan:   Principal Problem:   Intertrochanteric fracture of left hip [facility]) Active Problems:   Paroxysmal atrial fibrillation (HCC)   Hypertension   Left hip pain   Leukocytosis   HLD (hyperlipidemia)   Confusion  1-Closed left Hip fracture;  -S/P intramedullary nail 1/26.  PT recommending CIR>  CIR evaluating.  Pain management.   2-Anemia due to acute blood loss anemia. Post sx.  Received one unit PRBC>this admission.  CBC at 7.  Check anemia panel.  Repeat HB tomorrow.   3-Acute metabolic encephalopathy;  Delirium, hospital, vs medications related.  Per husband patient is more alert, she was feeding herself today.    4-Dysphagia;  Report sore throat, swallowing difficulty.  She was choking with water. Difficult to swallow food.  NPO status, Speech swallow consulted.  Order chloraseptic spry, nystatin Will proceed with MRI, no other focal deficit.  5-PAF On coumadin, INR low.  Pharmacy adjusting coumadin.   6-CKD stage 3a;  AKI with metabolic acidosis,  Peak to 1.8.  Improving.  Cr down to 1.4.   7-Vitamin D; start Vitamin D supplement.   8-HTN;  Hold Chlorthalidone and losartan.  Continue with toprol.  Added Hydralazine prn.   Leukocytosis; resolved.   HLD; Continue with lipitor.          Estimated body mass index is 29.87 kg/m as calculated from the  following:   Height as of this encounter: 5\' 4"  (1.626 m).   Weight as of this encounter: 78.9 kg.   DVT prophylaxis: coumadin  Code Status: Full code Family Communication: Husband who was at bedside.  Disposition Plan:  Status is: Inpatient  Remains inpatient appropriate because:Ongoing diagnostic testing needed not appropriate for outpatient work up   Dispo: The patient is from: Home              Anticipated d/c is to: cir               Anticipated d/c date is: 2 days              Patient currently is not medically stable to d/c.   Difficult to place patient No        Consultants:   Ortho  Procedures:  Underwent cephalomedullary nailing of left left intertrochanteric femur fracture and removal of hardware left femur on 04/23/2020.     Antimicrobials:  None  Subjective: She is alert, complaints of sore throat, mild cough. difficulty swallowing started today, choke with water, difficult to swallow solid. 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   Objective: Vitals:   04/25/20 1509 04/25/20 2115 04/25/20 2115 04/26/20 0501  BP: (!) 132/54 (!) 154/56 (!) 154/56 (!) 170/62  Pulse: 82 80 80 88  Resp: 17 16 16 20   Temp: 99.5 F (37.5 C) 99.6 F (37.6 C) 99.6 F (37.6 C) 98.4 F (36.9 C)  TempSrc: Oral Oral Oral Oral  SpO2: 95% 93% 93%  93%  Weight:      Height:        Intake/Output Summary (Last 24 hours) at 04/26/2020 0740 Last data filed at 04/26/2020 8341 Gross per 24 hour  Intake 2217.03 ml  Output 1250 ml  Net 967.03 ml   Filed Weights   04/24/20 0900  Weight: 78.9 kg    Examination:  General exam: Appears calm and comfortable  Respiratory system: Clear to auscultation. Respiratory effort normal. Cardiovascular system: S1 & S2 heard, RRR. No JVD, murmurs, rubs, gallops or clicks. No pedal edema. Gastrointestinal system: Abdomen is nondistended, soft and nontender. No organomegaly or masses felt. Normal bowel sounds  heard. Central nervous system: Alert and oriented. Alert,, chronic left side facial paralysis, motor strength 5/5/  Extremities: Symmetric 5 x 5 power.    Data Reviewed: I have personally reviewed following labs and imaging studies  CBC: Recent Labs  Lab 04/22/20 1650 04/23/20 0500 04/24/20 0827 04/24/20 1628 04/25/20 0741 04/26/20 0115  WBC 11.7* 9.2 8.7 8.6 8.0 9.0  NEUTROABS 10.4* 7.1  --  6.1  --   --   HGB 10.1* 8.9* 7.0* 7.3* 7.0* 7.0*  HCT 30.8* 28.3* 22.0* 23.2* 21.4* 22.6*  MCV 88.5 87.9 89.1 87.5 87.3 90.4  PLT 269 246 210 188 175 962   Basic Metabolic Panel: Recent Labs  Lab 04/22/20 1650 04/23/20 0500 04/24/20 0827 04/25/20 0741 04/26/20 0115  NA 139 135 136 138 138  K 4.1 3.8 4.2 4.3 4.7  CL 105 103 106 108 111  CO2 23 21* 21* 21* 20*  GLUCOSE 165* 130* 135* 119* 136*  BUN 30* 26* 29* 28* 30*  CREATININE 1.46* 1.17* 1.85* 1.75* 1.48*  CALCIUM 9.1 8.7* 8.1* 8.0* 7.7*  MG  --  1.7  --   --   --    GFR: Estimated Creatinine Clearance: 28.8 mL/min (A) (by C-G formula based on SCr of 1.48 mg/dL (H)). Liver Function Tests: No results for input(s): AST, ALT, ALKPHOS, BILITOT, PROT, ALBUMIN in the last 168 hours. No results for input(s): LIPASE, AMYLASE in the last 168 hours. No results for input(s): AMMONIA in the last 168 hours. Coagulation Profile: Recent Labs  Lab 04/22/20 1650 04/23/20 0500 04/24/20 0827 04/25/20 0741 04/26/20 0115  INR 2.3* 1.5* 1.3* 1.2 1.2   Cardiac Enzymes: No results for input(s): CKTOTAL, CKMB, CKMBINDEX, TROPONINI in the last 168 hours. BNP (last 3 results) No results for input(s): PROBNP in the last 8760 hours. HbA1C: No results for input(s): HGBA1C in the last 72 hours. CBG: Recent Labs  Lab 04/23/20 1229  GLUCAP 145*   Lipid Profile: No results for input(s): CHOL, HDL, LDLCALC, TRIG, CHOLHDL, LDLDIRECT in the last 72 hours. Thyroid Function Tests: No results for input(s): TSH, T4TOTAL, FREET4, T3FREE,  THYROIDAB in the last 72 hours. Anemia Panel: No results for input(s): VITAMINB12, FOLATE, FERRITIN, TIBC, IRON, RETICCTPCT in the last 72 hours. Sepsis Labs: No results for input(s): PROCALCITON, LATICACIDVEN in the last 168 hours.  Recent Results (from the past 240 hour(s))  SARS CORONAVIRUS 2 (TAT 6-24 HRS) Nasopharyngeal Nasopharyngeal Swab     Status: None   Collection Time: 04/22/20  5:21 PM   Specimen: Nasopharyngeal Swab  Result Value Ref Range Status   SARS Coronavirus 2 NEGATIVE NEGATIVE Final    Comment: (NOTE) SARS-CoV-2 target nucleic acids are NOT DETECTED.  The SARS-CoV-2 RNA is generally detectable in upper and lower respiratory specimens during the acute phase of infection. Negative results do not preclude SARS-CoV-2 infection,  do not rule out co-infections with other pathogens, and should not be used as the sole basis for treatment or other patient management decisions. Negative results must be combined with clinical observations, patient history, and epidemiological information. The expected result is Negative.  Fact Sheet for Patients: SugarRoll.be  Fact Sheet for Healthcare Providers: https://www.woods-mathews.com/  This test is not yet approved or cleared by the [place] FDA and  has been authorized for detection and/or diagnosis of SARS-CoV-2 by FDA under an Emergency Use Authorization (EUA). This EUA will remain  in effect (meaning this test can be used) for the duration of the COVID-19 declaration under Se ction 564(b)(1) of the Act, 21 U.S.C. section 360bbb-3(b)(1), unless the authorization is terminated or revoked sooner.  Performed at [facility], [place] [address], [place], [place] [address]   Surgical pcr screen     Status: None   Collection Time: 04/23/20  1:54 PM   Specimen: Nasal Mucosa; Nasal Swab  Result Value Ref Range Status   MRSA, PCR NEGATIVE NEGATIVE Final   Staphylococcus aureus  NEGATIVE NEGATIVE Final    Comment: (NOTE) The Xpert SA Assay (FDA approved for NASAL specimens in patients 32 years of age and older), is one component of a comprehensive surveillance program. It is not intended to diagnose infection nor to guide or monitor treatment. Performed at [facility], [place] [address], [place],  [address]          Radiology Studies: No results found.      Scheduled Meds: . atorvastatin  20 mg Oral QPC supper  . cholecalciferol  1,000 Units Oral Daily  . docusate sodium  100 mg Oral BID  . metoprolol succinate  25 mg Oral Daily  . pantoprazole (PROTONIX) IV  40 mg Intravenous Q24H  . Vitamin D (Ergocalciferol)  50,000 Units Oral Q7 days  . Warfarin - Pharmacist Dosing Inpatient   Does not apply q1600   Continuous Infusions: . methocarbamol (ROBAXIN) IV       LOS: 4 days    Time spent: 345 minutes.     Elmarie Shiley, MD [facility]   If 7PM-7AM, please contact night-coverage [email or web address]  04/26/2020, 7:40 AM

## 2020-04-26 NOTE — Plan of Care (Signed)

## 2020-04-26 NOTE — PMR Pre-admission (Addendum)
PMR Admission Coordinator Pre-Admission Assessment  Patient: [name] is an 87 y.o., female MRN: [id number] DOB: Feb 14, 1934 Height: 5\' 4"  (162.6 cm) Weight: 85.7 kg              Insurance Information HMO:     PPO:      PCP:      IPA:      80/20:      OTHER:  PRIMARY: Medicare Part A and B      Policy#: [id number]    Subscriber: patient CM Name:       Phone#:      Fax#:  Pre-Cert#: verified Civil engineer, contracting:  Benefits:  Phone #:      Name:  Eff. Date: 06/27/2000 A and B     Deduct: $1556      Out of Pocket Max: n/a      Life Max: n/a CIR: 100%      SNF: 20 full days Outpatient: 80%     Co-Pay: 20% Home Health: 100%      Co-Pay:  DME: 80%     Co-Pay: 20% Providers: pt choice  SECONDARY: [employer]   Policy#: [id number]      Phone#:   Financial Counselor:       Phone#:   The "Data Collection Information Summary" for patients in Inpatient Rehabilitation Facilities with attached "Privacy Act Ava Records" was provided and verbally reviewed with: Patient and Family  Emergency Contact Information Contact Information    Name Relation Home Work Mobile   Clark L [place]  [phone]     Current Medical History  Patient Admitting Diagnosis: Closed L hip fracture   History of Present Illness: [name] is an 87 year old right-handed female with history of atrial fibrillation on chronic Coumadin, CKD stage III with baseline 1.46, breast cancer with bilateral mastectomy 1990s, hearing impaired, hypertension, TIA without residual weakness, recurrent UTI, hyperlipidemia, bilateral total knee replacements as well as left femur fracture May 2019 with ORIF.  Presented 04/22/2020 after a fall, no loss of consciousness, with pt landing on her left hip.  She stated she leaned too far forward and ended up falling out of her chair.  Admission chemistries hemoglobin 10.1 WBC 11,700, BUN 30, creatinine 1.46, urinalysis negative nitrite, INR 2.3.  X-rays and  imaging revealed left intertrochanteric femur fracture as well as healed distal femur fracture.  Patient did receive vitamin K to reverse Coumadin.  Underwent nailing of left intertrochanteric femur fracture removal of hardware left femur 04/23/2020 per Dr. Doreatha Martin.  Weightbearing as tolerated left lower extremity.  Postoperative, chronic Coumadin resumed.  Acute blood loss anemia 6.8 she did receive 1 unit packed red blood cells latest hemoglobin 8.0..  Creatinine remained stable 1.75 from baseline 1.46.  On 04/27/2020 patient with altered mental status reportedly having difficulty swallowing.  MRI of the brain showed small acute infarct in the right cerebellum bilaterally, occipitoparietal lobes, and high left precentral gyrus.  Patient did not receive TPA.  MRA of the head and neck showed no large vessel occlusion.  Echocardiogram showed ejection fraction of 70 to 75% no regional wall motion abnormalities.  Patient's chronic Coumadin initially placed on hold and has been resumed no need for bridging except patient would remain on aspirin until INR greater than 2.00 and then discontinue.  Tolerating a regular diet.  Therapy evaluations completed due to patient decreased functional mobility was recommended for a comprehensive rehab program.     Glasgow Coma Scale  Score: 15  Past Medical History  Past Medical History:  Diagnosis Date  . Arthritis    "fingers, hips, feet, ankles" (11/10/2012)  . Asthma   . Atrial fibrillation (HCC)    CHRONIC COUMADIN  . Breast cancer ([place]) 1990's   "cancer on one side; precancerous tissue on the other" (11/10/2012)  . Chronic bronchitis ([place])    "multiple times; not in a long time" (11/10/2012  . Depression   . GERD (gastroesophageal reflux disease)   . Hearing impaired   . Hypertension   . Irritable bowel   . Lymphedema    HX OF - IN LEFT ARM--NO NEEDLES OR B/P'S LEFT ARM  . Macular degeneration    "BEGINNINGS OF" MACULAR DEGENERATION  . Osteoporosis   .  Pneumonia    "multiple times; not in a long time" (11/10/2012)  . Recurrent UTI   . Sleep apnea    "dx'd w/it; don't wear mask or anything" (11/10/2012)  . Stroke ([place]) ~ 2005   HX OF TIA-NO RESIDUAL PROBLEM--PARALYSIS RT SIDE FACE /PT'S MOUTH DROOPS-AND LOSS OF HEARING RT EAR --SINCE EAR SURGERY AS A CHILD   . UTI (lower urinary tract infection)    FREQUENT    Family History  family history includes CAD in her father and mother; Stroke in her mother.  Prior Rehab/Hospitalizations:  Has the patient had prior rehab or hospitalizations prior to admission? Yes  Has the patient had major surgery during 100 days prior to admission? Yes  Current Medications   Current Facility-Administered Medications:  .  0.9 %  sodium chloride infusion (Manually program via Guardrails IV Fluids), , Intravenous, Once, Regalado, Belkys A, MD, Held at 04/28/20 1127 .  acetaminophen (TYLENOL) tablet 650 mg, 650 mg, Oral, Q6H PRN, 650 mg at 04/29/20 [id number] **OR** acetaminophen (TYLENOL) suppository 650 mg, 650 mg, Rectal, Q6H PRN, Patrecia Pace A, PA-C .  aspirin tablet 325 mg, 325 mg, Oral, Daily, Regalado, Belkys A, MD .  atorvastatin (LIPITOR) tablet 20 mg, 20 mg, Oral, QPC supper, Patrecia Pace A, PA-C, 20 mg at 04/28/20 1825 .  cholecalciferol (VITAMIN D3) tablet 1,000 Units, 1,000 Units, Oral, Daily, Delray Alt, PA-C, 1,000 Units at 04/28/20 0846 .  docusate sodium (COLACE) capsule 100 mg, 100 mg, Oral, BID, Delray Alt, PA-C, 100 mg at 04/28/20 2026 .  hydrALAZINE (APRESOLINE) injection 10 mg, 10 mg, Intravenous, Q6H PRN, Regalado, Belkys A, MD, 10 mg at 04/27/20 0607 .  methocarbamol (ROBAXIN) tablet 500 mg, 500 mg, Oral, Q6H PRN, 500 mg at 04/28/20 0424 **OR** [DISCONTINUED] methocarbamol (ROBAXIN) 500 mg in dextrose 5 % 50 mL IVPB, 500 mg, Intravenous, Q6H PRN, Yacobi, Sarah A, PA-C .  metoprolol succinate (TOPROL-XL) 24 hr tablet 25 mg, 25 mg, Oral, Daily, Patrecia Pace A, PA-C, 25 mg at 04/28/20  0846 .  naloxone [facility]) injection 0.4 mg, 0.4 mg, Intravenous, PRN, Patrecia Pace A, PA-C .  nystatin (MYCOSTATIN) 100000 UNIT/ML suspension 500,000 Units, 5 mL, Oral, QID, Regalado, Belkys A, MD, 500,000 Units at 04/28/20 2027 .  ondansetron (ZOFRAN) tablet 4 mg, 4 mg, Oral, Q6H PRN **OR** ondansetron (ZOFRAN) injection 4 mg, 4 mg, Intravenous, Q6H PRN, Ricci Barker, Sarah A, PA-C .  oxyCODONE-acetaminophen (PERCOCET/ROXICET) 5-325 MG per tablet 1 tablet, 1 tablet, Oral, Q4H PRN, Delray Alt, PA-C, 1 tablet at 04/26/20 [phone] .  pantoprazole (PROTONIX) EC tablet 40 mg, 40 mg, Oral, Daily, Regalado, Belkys A, MD .  phenol (CHLORASEPTIC) mouth spray 1 spray, 1 spray, Mouth/Throat, PRN, Regalado, Belkys  A, MD, 1 spray at 04/26/20 1422 .  polyethylene glycol (MIRALAX / GLYCOLAX) packet 17 g, 17 g, Oral, Daily PRN, Ricci Barker, Sarah A, PA-C .  vitamin B-12 (CYANOCOBALAMIN) tablet 1,000 mcg, 1,000 mcg, Oral, Daily, Bhagat, Srishti L, MD, 1,000 mcg at 04/28/20 0846 .  Vitamin D (Ergocalciferol) (DRISDOL) capsule 50,000 Units, 50,000 Units, Oral, Q7 days, Regalado, Belkys A, MD, 50,000 Units at 04/26/20 0923 .  warfarin (COUMADIN) tablet 5 mg, 5 mg, Oral, ONCE-1600, Henri Medal, RPH .  Warfarin - Pharmacist Dosing Inpatient, , Does not apply, q1600, Regalado, Belkys A, MD  Patients Current Diet:  Diet Order            Diet regular Room service appropriate? Yes; Fluid consistency: Thin  Diet effective now                 Precautions / Restrictions Precautions Precautions: Fall Restrictions Weight Bearing Restrictions: Yes LLE Weight Bearing: Weight bearing as tolerated   Has the patient had 2 or more falls or a fall with injury in the past year?Yes  Prior Activity Level Community (5-7x/wk): Pt. was active in the community PTA  Prior Functional Level Prior Function Level of Independence: Needs assistance Gait / Transfers Assistance Needed: ambulates with RW at baseline ADL's / Homemaking  Assistance Needed: requires assistance for LB dressing and IADL  Self Care: Did the patient need help bathing, dressing, using the toilet or eating?  Independent  Indoor Mobility: Did the patient need assistance with walking from room to room (with or without device)? Independent  Stairs: Did the patient need assistance with internal or external stairs (with or without device)? Needed some help  Functional Cognition: Did the patient need help planning regular tasks such as shopping or remembering to take medications? [place] / [place] Devices/Equipment: Shower chair with back,Eyeglasses,Hearing aid,Wheelchair Home Equipment: Walker - 2 wheels,Bedside commode,Shower seat,Toilet riser,Hand held shower head,Wheelchair - manual,Other (comment) (lift chair)  Prior Device Use: Indicate devices/aids used by the patient prior to current illness, exacerbation or injury? Walker  Current Functional Level Cognition  Overall Cognitive Status: Difficult to assess Difficult to assess due to: Hard of hearing/deaf Current Attention Level: Selective Orientation Level: Oriented X4 Following Commands: Follows one step commands with increased time General Comments: Much improved compared to last session    Extremity Assessment (includes Sensation/Coordination)  Upper Extremity Assessment: Generalized weakness  Lower Extremity Assessment: Defer to PT evaluation LLE Deficits / Details: grossly 2/5. Patient very stiff with little knee flexion with movement LLE: Unable to fully assess due to pain    ADLs  Overall ADL's : Needs assistance/impaired Eating/Feeding: Minimal assistance,Bed level Eating/Feeding Details (indicate cue type and reason): assist to open containers, set up tray Grooming: Oral care,Brushing hair,Minimal assistance,Sitting Toileting- Clothing Manipulation and Hygiene: Total assistance,+2 for physical assistance,Bed level General ADL  Comments: pt able to participate maximally in therapy session this visit, works hard, but fatigues    Mobility  Overal bed mobility: Needs Assistance Bed Mobility: Rolling,Sidelying to Sit Rolling: Max assist Sidelying to sit: Max assist,+2 for safety/equipment Supine to sit: Total assist,+2 for physical assistance,+2 for safety/equipment Sit to supine: Total assist,+2 for physical assistance,+2 for safety/equipment General bed mobility comments: Noatble increased initiation and paticipation in mobility tasks, assist for LEs over EOB and to raise trunk, assist for hips to EOB using bed pad    Transfers  Overall transfer level: Needs assistance Equipment used: Rolling walker (2 wheeled),Ambulation equipment used Transfer  via Lift Equipment: Stedy Transfers: Sit to/from [employer] to Stand: +2 physical assistance,Max assist Stand pivot transfers: +2 physical assistance,Max assist General transfer comment: use of pad under hips and gait belt to assist hips up, cues for hand placement with RW, posterior bias requring +2 max assist to correct in standing, stedying assist as pt pivoted to chair without ability to pick up L foot    Ambulation / Gait / Stairs / Wheelchair Mobility       Posture / Balance Dynamic Sitting Balance Sitting balance - Comments: initially requiring minA-min guard to maintain sitting balance, progressing to close supervision Balance Overall balance assessment: Needs assistance Sitting-balance support: Bilateral upper extremity supported,Feet supported Sitting balance-Leahy Scale: Fair Sitting balance - Comments: initially requiring minA-min guard to maintain sitting balance, progressing to close supervision Standing balance support: Bilateral upper extremity supported,During functional activity Standing balance-Leahy Scale: Poor Standing balance comment: able to stand in sara stedy from flaps with min guard assist x 2    Special needs/care  consideration Skin Surgical incision to L hip; Ecchymosis on R flank and Designated visitor Chalet Ketcham (husband)     Previous Environmental health practitioner (from acute therapy documentation) Living Arrangements: Spouse/significant other  Lives With: Spouse Available Help at Discharge: Family,Available 24 hours/day Type of Home: House Home Layout: Two level,Able to live on main level with bedroom/bathroom Alternate Level Stairs-Rails: Right Alternate Level Stairs-Number of Steps: full flight Home Access: Ramped entrance Bathroom Shower/Tub: Multimedia programmer: Handicapped height Bathroom Accessibility: Yes How Accessible: Accessible via walker [place]: Yes Type of Home Care Services: Home PT  Discharge Living Setting Plans for Discharge Living Setting: Patient's home Type of Home at Discharge: House Discharge Home Layout: Two level,Able to live on main level with bedroom/bathroom Alternate Level Stairs-Rails: Right Alternate Level Stairs-Number of Steps: full flight Discharge Home Access: Ramped entrance Discharge Bathroom Shower/Tub: Walk-in shower Discharge Bathroom Toilet: Handicapped height Discharge Bathroom Accessibility: Yes How Accessible: Accessible via walker Does the patient have any problems obtaining your medications?: No  Social/Family/Support Systems Patient Roles: Spouse Contact Information: [phone] Anticipated Caregiver: Zanyah Wach Anticipated Caregiver's Contact Information: [phone] Ability/Limitations of Caregiver: Can provide min-mod A Caregiver Availability: 24/7 Discharge Plan Discussed with Primary Caregiver: Yes Is Caregiver In Agreement with Plan?: Yes Does Caregiver/Family have Issues with Lodging/Transportation while Pt is in Rehab?: No   Goals Patient/Family Goal for Rehab: PT/OT supervision to min assist, SLP supervision Expected length of stay: 18-21 days Pt/Family Agrees to Admission and willing to participate:  Yes Program Orientation Provided & Reviewed with Pt/Caregiver Including Roles  & Responsibilities: Yes   Decrease burden of Care through IP rehab admission: n/a  Possible need for SNF placement upon discharge: Potentially, pt with new stroke in addition to L hip fracture.   Patient Condition: This patient's condition remains as documented in the consult dated 04/28/20, in which the Rehabilitation Physician determined and documented that the patient's condition is appropriate for intensive rehabilitative care in an inpatient rehabilitation facility. Will admit to inpatient rehab today.  Preadmission Screen Completed By:  Michel Santee, PT, DPT 04/29/2020 10:20 AM ______________________________________________________________________   Discussed status with Dr. Dagoberto Ligas on 04/29/20 at 10:20 AM  and received approval for admission today.  Admission Coordinator:  Michel Santee, PT, DPT time 10:20 AM Sudie Grumbling 04/29/20

## 2020-04-26 NOTE — Progress Notes (Signed)
ANTICOAGULATION CONSULT NOTE - Initial Consult  Pharmacy Consult for Warfarin Indication: atrial fibrillation  Patient Measurements: Height: 5\' 4"  (162.6 cm) Weight: 78.9 kg (174 lb) IBW/kg (Calculated) : 54.7  Vital Signs: Temp: 98.4 F (36.9 C) (01/29 0501) Temp Source: Oral (01/29 0501) BP: 170/62 (01/29 0501) Pulse Rate: 88 (01/29 0501)  Labs: Recent Labs    04/24/20 0827 04/24/20 1628 04/25/20 0741 04/26/20 0115  HGB 7.0* 7.3* 7.0* 7.0*  HCT 22.0* 23.2* 21.4* 22.6*  PLT 210 188 175 189  LABPROT 15.7*  --  15.1 14.8  INR 1.3*  --  1.2 1.2  CREATININE 1.85*  --  1.75* 1.48*    Estimated Creatinine Clearance: 28.8 mL/min (A) (by C-G formula based on SCr of 1.48 mg/dL (H)).   Assessment: 68 YOF on warfarin PTA for hx Afib s/p L-femur fracture repair on 1/26. Pharmacy consulted to start Warfarin without a bridge when Hgb stable.   Hgb was down to 7 post-op from 8.9. Warfarin initiation was held. Hgb stable at 7 yesterday. Discussed with ortho Ricci Barker) who has okayed Warfarin to resume 1/28. Hgb remains stable at 7 this AM; PLTs WNL.  Vit K 5 mg given for reversal on 1/25. PTA dose was 5 mg daily EXCEPT for 7.5 mg on Tues/Thurs. INR remains subtherapeutic today as expected; will continue PTA dosing for now (5mg  warfarin today). Expect INR to increase over next several days.   Goal of Therapy:  INR 2-3 Monitor platelets by anticoagulation protocol: Yes   Plan:  - Warfarin 5 mg x 1 dose at 1600 today - Daily PT/INR, CBC q72h - Will continue to monitor for any signs/symptoms of bleeding and will follow up with PT/INR in the a.m.    Thank you for allowing pharmacy to be a part of this patient's care.  Alfonse Spruce, PharmD PGY2 ID Pharmacy Resident Phone between 7 am - 3:30 pm: [phone]  Please check AMION for all [place] phone numbers After 10:00 PM, call [place] [phone]

## 2020-04-27 ENCOUNTER — Inpatient Hospital Stay (HOSPITAL_COMMUNITY): Payer: Medicare Other

## 2020-04-27 DIAGNOSIS — I6389 Other cerebral infarction: Secondary | ICD-10-CM

## 2020-04-27 DIAGNOSIS — R410 Disorientation, unspecified: Secondary | ICD-10-CM

## 2020-04-27 DIAGNOSIS — S72145A Nondisplaced intertrochanteric fracture of left femur, initial encounter for closed fracture: Secondary | ICD-10-CM | POA: Diagnosis not present

## 2020-04-27 LAB — IRON AND TIBC
Iron: 17 ug/dL — ABNORMAL LOW (ref 28–170)
Saturation Ratios: 10 % — ABNORMAL LOW (ref 10.4–31.8)
TIBC: 171 ug/dL — ABNORMAL LOW (ref 250–450)
UIBC: 154 ug/dL

## 2020-04-27 LAB — FOLATE: Folate: 18.1 ng/mL (ref >5.9)

## 2020-04-27 LAB — ECHOCARDIOGRAM COMPLETE
Height: 64 in
S' Lateral: 2.9 cm
Weight: 3022.95 oz

## 2020-04-27 LAB — CBC
HCT: 23 % — ABNORMAL LOW (ref 36.0–46.0)
Hemoglobin: 7.6 g/dL — ABNORMAL LOW (ref 12.0–15.0)
MCH: 28.8 pg (ref 26.0–34.0)
MCHC: 33 g/dL (ref 30.0–36.0)
MCV: 87.1 fL (ref 80.0–100.0)
Platelets: 244 10*3/uL (ref 150–400)
RBC: 2.64 MIL/uL — ABNORMAL LOW (ref 3.87–5.11)
RDW: 14.3 % (ref 11.5–15.5)
WBC: 9.2 10*3/uL (ref 4.0–10.5)
nRBC: 0 % (ref 0.0–0.2)

## 2020-04-27 LAB — RETICULOCYTES
Immature Retic Fract: 31.5 % — ABNORMAL HIGH (ref 2.3–15.9)
RBC.: 2.49 MIL/uL — ABNORMAL LOW (ref 3.87–5.11)
Retic Count, Absolute: 54.3 10*3/uL (ref 19.0–186.0)
Retic Ct Pct: 2.2 % (ref 0.4–3.1)

## 2020-04-27 LAB — BASIC METABOLIC PANEL
Anion gap: 10 (ref 5–15)
BUN: 22 mg/dL (ref 8–23)
CO2: 20 mmol/L — ABNORMAL LOW (ref 22–32)
Calcium: 8.3 mg/dL — ABNORMAL LOW (ref 8.9–10.3)
Chloride: 109 mmol/L (ref 98–111)
Creatinine, Ser: 1.2 mg/dL — ABNORMAL HIGH (ref 0.44–1.00)
GFR, Estimated: 45 mL/min — ABNORMAL LOW (ref >60)
Glucose, Bld: 126 mg/dL — ABNORMAL HIGH (ref 70–99)
Potassium: 3.9 mmol/L (ref 3.5–5.1)
Sodium: 139 mmol/L (ref 135–145)

## 2020-04-27 LAB — VITAMIN B12: Vitamin B-12: 319 pg/mL (ref 180–914)

## 2020-04-27 LAB — PROTIME-INR
INR: 1.4 — ABNORMAL HIGH (ref 0.8–1.2)
Prothrombin Time: 16.9 seconds — ABNORMAL HIGH (ref 11.4–15.2)

## 2020-04-27 LAB — FERRITIN: Ferritin: 385 ng/mL — ABNORMAL HIGH (ref 11–307)

## 2020-04-27 LAB — TSH: TSH: 1.532 u[IU]/mL (ref 0.350–4.500)

## 2020-04-27 MED ORDER — VITAMIN B-12 1000 MCG PO TABS
1000.0000 ug | ORAL_TABLET | Freq: Every day | ORAL | Status: DC
  Administered 2020-04-27 – 2020-04-29 (×3): 1000 ug via ORAL
  Filled 2020-04-27 (×3): qty 1

## 2020-04-27 MED ORDER — WARFARIN SODIUM 5 MG PO TABS: 5.0000 mg | ORAL_TABLET | Freq: Once | ORAL | Status: DC

## 2020-04-27 MED ORDER — GADOBUTROL 1 MMOL/ML IV SOLN
8.5000 mL | Freq: Once | INTRAVENOUS | Status: AC | PRN
  Administered 2020-04-27: 8.5 mL via INTRAVENOUS

## 2020-04-27 MED ORDER — SODIUM CHLORIDE 0.9 % IV SOLN
510.0000 mg | Freq: Once | INTRAVENOUS | Status: AC
  Administered 2020-04-27: 510 mg via INTRAVENOUS
  Filled 2020-04-27: qty 17

## 2020-04-27 MED ORDER — ASPIRIN 325 MG PO TABS
325.0000 mg | ORAL_TABLET | Freq: Every day | ORAL | Status: DC
  Administered 2020-04-29: 325 mg via ORAL
  Filled 2020-04-27 (×3): qty 1

## 2020-04-27 NOTE — Progress Notes (Signed)
  Speech Language Pathology Treatment: Dysphagia  Patient Details Name: [name] MRN: [id number] DOB: [birth year] Today's Date: 04/27/2020 Time: 9417-9199 SLP Time Calculation (min) (ACUTE ONLY): 10 min  Assessment / Plan / Recommendation Clinical Impression  Pt reassessed at bedside for PO readiness.  Pt and husband denied any further swallowing difficulty and pt's mental status has improved as well.  Pt has been having puree snacks and water without any coughing or choking.  No difficulty with secretion management noted.  Pt tolerated all consistencies trialed today with no clincial s/s of aspiration and exhibited good oral clearance of solids.  Pt has no further ST needs at this time.  SLP will sign off.  Recommend regular texture diet with thin liquids.   HPI HPI: 87 year old admitted to [facility]/22/2020 with acute closed left intratrochanteric hip fracture after presenting from home complaining with left hip pain. Pt underwent cephalomedullary nailing of left left intertrochanteric femur fracture and removal of hardware left femur on 04/23/2020.  PMH significant for PAF on coumadin, recurrent UTI, HTN, HLD aX ray of left hip showed Mildly displaced and impacted intratrochanteric fracture of left femur. Per MD note, pt complained of sore throat, choking with water and difficulty swallowing food. MRI 1/30: "Small acute infarcts in the right cerebellum, bilateral occipital  parietal lobes, and high left precentral gyrus."      SLP Plan  All goals met;Discharge SLP treatment due to (comment)       Recommendations  Diet recommendations: Regular;Thin liquid Liquids provided via: Cup;Straw Medication Administration: Whole meds with liquid Supervision: Staff to assist with self feeding Compensations: Slow rate;Small sips/bites Postural Changes and/or Swallow Maneuvers: Seated upright 90 degrees                Oral Care Recommendations: Oral care BID Follow up Recommendations:  None SLP Visit Diagnosis: Dysphagia, unspecified (R13.10) Plan: All goals met;Discharge SLP treatment due to (comment)       [place], [place], [place] Office: [phone] 04/27/2020, 10:35 AM

## 2020-04-27 NOTE — Progress Notes (Signed)
PROGRESS NOTE    [name]  [id number] DOB: December 05, 1933 DOA: 04/22/2020 PCP: Leanna Battles, MD   Brief Narrative: 87 year old with PMH significant for PAF on coumadin, recurrent UTI, HTN, HLD admitted to [facility] 04/22/2020 with acute closed left intratrochanteric hip fracture after presenting from home complaining with left hip pain.  X ray of left hip showed Mildly displaced and impacted intratrochanteric fracture of left femur.   Underwent cephalomedullary nailing of left left intertrochanteric femur fracture and removal of hardware left femur on 04/23/2020.   Assessment & Plan:   Principal Problem:   Intertrochanteric fracture of left hip [facility]) Active Problems:   Paroxysmal atrial fibrillation (HCC)   Hypertension   Left hip pain   Leukocytosis   HLD (hyperlipidemia)   Confusion  1-Closed left Hip fracture;  -S/P intramedullary nail 1/26.  PT recommending CIR>  CIR evaluating.  Pain management.   2-Anemia due to acute blood loss anemia. Post sx.  Received one unit PRBC>this admission.  CBC at 7.  anemia panel consistent with iron deficiency.  Will give IV iron.  Hb at 7.6  3-Acute metabolic encephalopathy;  Delirium, hospital, vs medications related.  Per husband patient is more alert, she was feeding herself 1/29. Patient was notice to have swallow difficult during breakfast 1/29.  MRI ; obtain showed multiples small infarct  Started on B 12 supplement. MMA ordered.   4-Multiples Small acute infarct;  MRI: Small acute infarcts in the right cerebellum, bilateral occipital, parietal lobes, and high left precentral gyrus. Neurologist consulted.  Plan to hold coumadin.  Start aspirin if ok by Ortho. I discussed with Dr Humberto Seals, he was ok with aspirin.   Dysphagia;  Report sore throat, swallowing difficulty.  She was choking with water. Difficult to swallow food notice 1/29 On chloraseptic spry, nystatin MRI positive for acute stroke.  Per husband  patient appears much improved today, swallowing better.  Speech swallow today, recommend regular thin liquid diet.   5-PAF Chronically on  coumadin, INR 1.4.  Pharmacy adjusting coumadin.  Plan to hold coumadin for two days due to risk for stroke transformation.  Follow stroke team recommendation tomorrow.   6-CKD stage 3a;  AKI with metabolic acidosis,  Peak to 1.8.  Improving.  Cr down to 1.4.   7-Vitamin D; started Vitamin D supplement.   8-HTN;  Hold Chlorthalidone and losartan.  Continue with toprol.  Added Hydralazine prn.  Permissive HTN in setting stroke.    Leukocytosis; resolved.   HLD; Continue with lipitor.          Estimated body mass index is 32.43 kg/m as calculated from the following:   Height as of this encounter: 5\' 4"  (1.626 m).   Weight as of this encounter: 85.7 kg.   DVT prophylaxis: coumadin  Code Status: Full code Family Communication: Husband who was at bedside.  Disposition Plan:  Status is: Inpatient  Remains inpatient appropriate because:Ongoing diagnostic testing needed not appropriate for outpatient work up   Dispo: The patient is from: Home              Anticipated d/c is to: cir               Anticipated d/c date is: 2 days              Patient currently is not medically stable to d/c.   Difficult to place patient No        Consultants:   Ortho  Procedures:  Underwent  cephalomedullary nailing of left left intertrochanteric femur fracture and removal of hardware left femur on 04/23/2020.     Antimicrobials:  None  Subjective: She is alert, she report sore throat has improved. She is feeling better.  Per husband patient was able to swallow two sips of water without difficulty today,. She did well as well with apple sauce.    Objective: Vitals:   04/26/20 1316 04/26/20 2056 04/27/20 0409 04/27/20 0500  BP: (!) 181/63 (!) 188/54 (!) 170/62   Pulse: 81 83 87   Resp: 15 18 18    Temp: 97.9 F (36.6 C) 98.9 F  (37.2 C) 99.4 F (37.4 C)   TempSrc:  Oral Oral   SpO2: 94% 95% 95%   Weight:    85.7 kg  Height:        Intake/Output Summary (Last 24 hours) at 04/27/2020 0814 Last data filed at 04/27/2020 0500 Gross per 24 hour  Intake 950.51 ml  Output 300 ml  Net 650.51 ml   Filed Weights   04/24/20 0900 04/27/20 0500  Weight: 78.9 kg 85.7 kg    Examination:  General exam: NAD Respiratory system: CTA Cardiovascular system: S 1, S 2 IRR Gastrointestinal system:  BS present, soft, nt Central nervous system: alert, answer questions, follows command Extremities: left hip with incision clean,     Data Reviewed: I have personally reviewed following labs and imaging studies  CBC: Recent Labs  Lab 04/22/20 1650 04/23/20 0500 04/24/20 0827 04/24/20 1628 04/25/20 0741 04/26/20 0115  WBC 11.7* 9.2 8.7 8.6 8.0 9.0  NEUTROABS 10.4* 7.1  --  6.1  --   --   HGB 10.1* 8.9* 7.0* 7.3* 7.0* 7.0*  HCT 30.8* 28.3* 22.0* 23.2* 21.4* 22.6*  MCV 88.5 87.9 89.1 87.5 87.3 90.4  PLT 269 246 210 188 175 885   Basic Metabolic Panel: Recent Labs  Lab 04/22/20 1650 04/23/20 0500 04/24/20 0827 04/25/20 0741 04/26/20 0115  NA 139 135 136 138 138  K 4.1 3.8 4.2 4.3 4.7  CL 105 103 106 108 111  CO2 23 21* 21* 21* 20*  GLUCOSE 165* 130* 135* 119* 136*  BUN 30* 26* 29* 28* 30*  CREATININE 1.46* 1.17* 1.85* 1.75* 1.48*  CALCIUM 9.1 8.7* 8.1* 8.0* 7.7*  MG  --  1.7  --   --   --    GFR: Estimated Creatinine Clearance: 30 mL/min (A) (by C-G formula based on SCr of 1.48 mg/dL (H)). Liver Function Tests: No results for input(s): AST, ALT, ALKPHOS, BILITOT, PROT, ALBUMIN in the last 168 hours. No results for input(s): LIPASE, AMYLASE in the last 168 hours. No results for input(s): AMMONIA in the last 168 hours. Coagulation Profile: Recent Labs  Lab 04/23/20 0500 04/24/20 0827 04/25/20 0741 04/26/20 0115 04/27/20 0138  INR 1.5* 1.3* 1.2 1.2 1.4*   Cardiac Enzymes: No results for input(s):  CKTOTAL, CKMB, CKMBINDEX, TROPONINI in the last 168 hours. BNP (last 3 results) No results for input(s): PROBNP in the last 8760 hours. HbA1C: No results for input(s): HGBA1C in the last 72 hours. CBG: Recent Labs  Lab 04/23/20 1229  GLUCAP 145*   Lipid Profile: No results for input(s): CHOL, HDL, LDLCALC, TRIG, CHOLHDL, LDLDIRECT in the last 72 hours. Thyroid Function Tests: No results for input(s): TSH, T4TOTAL, FREET4, T3FREE, THYROIDAB in the last 72 hours. Anemia Panel: Recent Labs    04/27/20 0134 04/27/20 0138  VITAMINB12  --  319  FOLATE 18.1  --   FERRITIN  --  385*  TIBC  --  171*  IRON  --  17*  RETICCTPCT  --  2.2   Sepsis Labs: No results for input(s): PROCALCITON, LATICACIDVEN in the last 168 hours.  Recent Results (from the past 240 hour(s))  SARS CORONAVIRUS 2 (TAT 6-24 HRS) Nasopharyngeal Nasopharyngeal Swab     Status: None   Collection Time: 04/22/20  5:21 PM   Specimen: Nasopharyngeal Swab  Result Value Ref Range Status   SARS Coronavirus 2 NEGATIVE NEGATIVE Final    Comment: (NOTE) SARS-CoV-2 target nucleic acids are NOT DETECTED.  The SARS-CoV-2 RNA is generally detectable in upper and lower respiratory specimens during the acute phase of infection. Negative results do not preclude SARS-CoV-2 infection, do not rule out co-infections with other pathogens, and should not be used as the sole basis for treatment or other patient management decisions. Negative results must be combined with clinical observations, patient history, and epidemiological information. The expected result is Negative.  Fact Sheet for Patients: SugarRoll.be  Fact Sheet for Healthcare Providers: https://www.woods-mathews.com/  This test is not yet approved or cleared by the [place] FDA and  has been authorized for detection and/or diagnosis of SARS-CoV-2 by FDA under an Emergency Use Authorization (EUA). This EUA will remain   in effect (meaning this test can be used) for the duration of the COVID-19 declaration under Se ction 564(b)(1) of the Act, 21 U.S.C. section 360bbb-3(b)(1), unless the authorization is terminated or revoked sooner.  Performed at [facility], [place] [address] [place], [place] [address]   Surgical pcr screen     Status: None   Collection Time: 04/23/20  1:54 PM   Specimen: Nasal Mucosa; Nasal Swab  Result Value Ref Range Status   MRSA, PCR NEGATIVE NEGATIVE Final   Staphylococcus aureus NEGATIVE NEGATIVE Final    Comment: (NOTE) The Xpert SA Assay (FDA approved for NASAL specimens in patients 43 years of age and older), is one component of a comprehensive surveillance program. It is not intended to diagnose infection nor to guide or monitor treatment. Performed at [facility], [place] [address] [place], [place] [address]          Radiology Studies: MR BRAIN WO CONTRAST  Result Date: 04/27/2020 CLINICAL DATA:  Cranial neuropathy 9 EXAM: MRI HEAD WITHOUT CONTRAST TECHNIQUE: Multiplanar, multiecho pulse sequences of the brain and surrounding structures were obtained without intravenous contrast. COMPARISON:  01/09/2017 FINDINGS: Brain: Subcentimeter acute infarcts in the right cerebellum, bilateral occipitoparietal cortex, and high left precentral gyrus. There has been a large remote left cerebellar infarction. Small remote occipital parietal infarcts. Confluent chronic small vessel ischemia in the cerebral white matter. Chronic small vessel ischemia to a milder degree in the brainstem where there is no visible acute infarct. Chronic ischemic injury to the deep gray nuclei accentuated by dilated perivascular spaces. Remote hypertensive pattern micro hemorrhages in the deep cerebellum and supratentorial brain. Generalized brain atrophy. No acute hemorrhage, hydrocephalus, or masslike finding. Vascular: Preserved flow voids Skull and upper cervical spine: Normal marrow signal.  C4-5 facet ankylosis. Sinuses/Orbits: Bilateral cataract resection. Minor mucosal thickening in paranasal sinuses. Right mastoidectomy. IMPRESSION: 1. Small acute infarcts in the right cerebellum, bilateral occipital parietal lobes, and high left precentral gyrus. 2. No acute infarct to correlate with the cranial nerve deficit. Thin slices through the brainstem could not be obtained due to patient condition. 3. Advanced chronic ischemic injury. Electronically Signed   By: Monte Fantasia M.D.   On: 04/27/2020 06:00  DG CHEST PORT 1 VIEW  Result Date: 04/26/2020 CLINICAL DATA:  Cough. EXAM: PORTABLE CHEST 1 VIEW COMPARISON:  04/22/2020 FINDINGS: Stable enlarged cardiac silhouette and tortuous and calcified thoracic aorta. The lungs remain clear with normal vascularity. Stable mild interstitial prominence without Kerley lines. Diffuse osteopenia. IMPRESSION: No acute abnormality. Stable cardiomegaly and mild chronic interstitial lung disease. Electronically Signed   By: Claudie Revering M.D.   On: 04/26/2020 12:18        Scheduled Meds: . atorvastatin  20 mg Oral QPC supper  . cholecalciferol  1,000 Units Oral Daily  . docusate sodium  100 mg Oral BID  . metoprolol succinate  25 mg Oral Daily  . nystatin  5 mL Oral QID  . pantoprazole (PROTONIX) IV  40 mg Intravenous Q24H  . Vitamin D (Ergocalciferol)  50,000 Units Oral Q7 days  . warfarin  5 mg Oral ONCE-1600  . Warfarin - Pharmacist Dosing Inpatient   Does not apply q1600   Continuous Infusions: . sodium chloride Stopped (04/27/20 0500)     LOS: 5 days    Time spent: 345 minutes.     Elmarie Shiley, MD [facility]   If 7PM-7AM, please contact night-coverage [email or web address]  04/27/2020, 8:14 AM

## 2020-04-27 NOTE — Progress Notes (Signed)
  Echocardiogram 2D Echocardiogram has been performed.  Darlina Sicilian M 04/27/2020, 12:38 PM

## 2020-04-27 NOTE — Consult Note (Addendum)
Neurology Consultation  Reason for Consult: Confusion, MRI with small acute infarcts in right cerebellum, bilateral occipital parietal lobes, and high left precentral gyrus.  Referring Physician: Dr. Tyrell Antonio  CC: Confusion, altered mental status, problem with swallowing noted by husband  History is obtained from: patient, patient's husband [name] at bedside, chart review  HPI: [name] is a 87 y.o. female with a medical history significant for paroxysmal atrial fibrillation on coumadin, recurrent urinary tract infections, hypertension, hyperlipidemia, right ear complete hearing loss, residual right facial droop from mastoiditis as a child, and frequent falls who presented to the hospital with left hip pain on 04/22/20 after a fall from her wheelchair sustaining a closed left intertrochanteric hip fracture. On presentation, her INR was 2.3 and she was given Vitamin K for reversal pre-procedure and coumadin was held. Post-operatively, her labs revealed a blood loss anemia and she was given 1 unit of packed red blood cells. During her hospital stay, she has been noted to have some confusion- metabolic encephalopathy versus delirium but yesterday morning 04/26/2020, her husband noted that in addition to her confusion, she was not swallowing normally and began to choke with breakfast prompting. An MRI was obtained revealing small acute strokes. Neurology was consulted for further stroke evaluation. Of note, her coumadin was re-started 04/25/20 and her INR this morning 04/27/2020 is 1.4.   Of note, at baseline [name] walks with a walker and requires minimal assistance with ADLs at home from her husband. He states she is unable to put on her socks but helps with cooking and is able to ambulate with her assistive devices without difficulty.   Per Dr. Georgie Chard note in October of 2018, [name] was being evaluated for unsteady gait and frequent falls with dizziness and a CT showed advanced chronic small  vessel ischemic changes with multiple remote cortical and subcortical lacunar infarcts and she was transitioned from a cane to a walker for ambulation assistance at this time. Due to findings of hyperreflexia, a MRI C-spine was obtained without evidence of cervical stenosis causing myelopathy.   LKW: 04/26/2020 around 08:00 tpa given?: no, outside of time window, patient on anticoagulation   ROS: A 14 point ROS was performed and is negative except as noted in the HPI.   Past Medical History:  Diagnosis Date  . Arthritis    "fingers, hips, feet, ankles" (11/10/2012)  . Asthma   . Atrial fibrillation (HCC)    CHRONIC COUMADIN  . Breast cancer ([place]) 1990's   "cancer on one side; precancerous tissue on the other" (11/10/2012)  . Chronic bronchitis ([place])    "multiple times; not in a long time" (11/10/2012  . Depression   . GERD (gastroesophageal reflux disease)   . Hearing impaired   . Hypertension   . Irritable bowel   . Lymphedema    HX OF - IN LEFT ARM--NO NEEDLES OR B/P'S LEFT ARM  . Macular degeneration    "BEGINNINGS OF" MACULAR DEGENERATION  . Osteoporosis   . Pneumonia    "multiple times; not in a long time" (11/10/2012)  . Recurrent UTI   . Sleep apnea    "dx'd w/it; don't wear mask or anything" (11/10/2012)  . Stroke ([place]) ~ 2005   HX OF TIA-NO RESIDUAL PROBLEM--PARALYSIS RT SIDE FACE /PT'S MOUTH DROOPS-AND LOSS OF HEARING RT EAR --SINCE EAR SURGERY AS A CHILD   . UTI (lower urinary tract infection)    FREQUENT   Past Surgical History:  Procedure Laterality Date  . BREAST BIOPSY  Bilateral 1990's  . Dacryocystorhinostomy  02/18/2016  . INNER EAR SURGERY Right    MULTIPLE EAR SURGERIES,  . INTRAMEDULLARY (IM) NAIL INTERTROCHANTERIC Left 04/23/2020   Procedure: INTRAMEDULLARY (IM) NAIL INTERTROCHANTRIC;  Surgeon: Shona Needles, MD;  Location: [facility];  Service: Orthopedics;  Laterality: Left;  . JOINT REPLACEMENT    . KNEE ARTHROSCOPY  04/07/2012   Procedure: ARTHROSCOPY  KNEE;  Surgeon: Gearlean Alf, MD;  Location: [facility];  Service: Orthopedics;  Laterality: Left;  WITH SYNOVECTOMY  . MASTECTOMY Bilateral 1990's  . MASTOIDECTOMY Right 1942  . ORIF FEMUR FRACTURE Left 08/08/2017   Procedure: OPEN REDUCTION INTERNAL FIXATION (ORIF) DISTAL FEMUR FRACTURE;  Surgeon: Shona Needles, MD;  Location: [facility];  Service: Orthopedics;  Laterality: Left;  . TONSILLECTOMY    . TOTAL KNEE ARTHROPLASTY  09/20/2011   LEFT TOTAL KNEE ARTHROPLASTY;  Surgeon: Gearlean Alf, MD;  Location: [facility];  Service: Orthopedics;  Laterality: Left;  . TOTAL KNEE ARTHROPLASTY  2006   Family History  Problem Relation Age of Onset  . CAD Mother   . Stroke Mother   . CAD Father    Social History:   reports that she has never smoked. She has never used smokeless tobacco. She reports that she does not drink alcohol and does not use drugs.  Medications Current Outpatient Medications  Medication Instructions  . atorvastatin (LIPITOR) 20 mg, Oral, Daily after supper  . chlorthalidone (HYGROTON) 50 mg, Oral, Daily  . estradiol (ESTRACE) 0.1 MG/GM vaginal cream 1 Applicatorful, Vaginal, 2 times weekly  . loperamide (IMODIUM) 2-4 mg, Oral, 5 times daily PRN  . losartan (COZAAR) 100 mg, Daily  . metoprolol succinate (TOPROL-XL) 25 mg, Oral, Daily  . Multiple Vitamins-Minerals (ONE-A-DAY PROACTIVE 65+) TABS 1 tablet, Oral, Daily with breakfast  . Multiple Vitamins-Minerals (PRESERVISION AREDS) CAPS 1 capsule, Oral, 2 times daily  . Polyethyl Glyc-Propyl Glyc PF (SYSTANE ULTRA PF) 0.4-0.3 % SOLN 1 drop, Both Eyes, Daily at bedtime  . Probiotic Product (VSL#3 PO) 1 capsule, Oral, Every morning  . saccharomyces boulardii (FLORASTOR) 500 mg, Oral, Every evening  . warfarin (COUMADIN) 5-7.5 mg, Oral, See admin instructions, Take 5 mg by mouth with supper (evening meal) on Sun/Mon/Wed/Fri/Sat and 7.5 mg on Tues/Thurs   Exam: Current vital signs: BP (!) 170/62   Pulse 87    Temp 99.4 F (37.4 C) (Oral)   Resp 18   Ht 5\' 4"  (1.626 m)   Wt 85.7 kg   SpO2 95%   BMI 32.43 kg/m  Vital signs in last 24 hours: Temp:  [97.9 F (36.6 C)-99.5 F (37.5 C)] 99.4 F (37.4 C) (01/30 0409) Pulse Rate:  [81-87] 87 (01/30 0409) Resp:  [15-18] 18 (01/30 0409) BP: (170-188)/(54-63) 170/62 (01/30 0409) SpO2:  [94 %-95 %] 95 % (01/30 0409) Weight:  [85.7 kg] 85.7 kg (01/30 0500)  GENERAL: Drowsy, laying in bed, occasionally moans with pain from left hip, hard of hearing, wakes to loud voice. PSYCH: mood appropriate for situation, becomes slightly perturbed during final steps of full examination HEAD: - Normocephalic and atraumatic EENT: No OP obstruction, normal conjunctiva, wears eyeglasses at baseline.  LUNGS - Normal respiratory effort, non-labored breathing CV - extremities warm, well perfused ABDOMEN - Soft, nontender, non-distended Ext: warm, surgical site on left hip well approximated without active hemorrhage  NEURO:  Mental Status: drowsy but alerts to voice. Oriented to age, month, place, person, situation, and time. Patient is able to give supporting history  but quickly falls asleep when not stimulated. Extremely hard of hearing at baseline. Follows commands mostly without complication but requires repeated coaching with some commands- attention difficulty versus hearing impairment.  Speech/Language: speech is mildly dysarthric but at baseline per husband at bedside.   Naming intact, repetition not consistently intact but concern for hearing impairment limitation on full exam, fluency, and comprehension are intact. Cranial Nerves:  II: PERRL 2 mm --> 80mm/brisk. Visual fields seem full by orienting to stimuli in all quadrants.   III, IV, VI: EOMI without ptosis or nystagmus.  V: Sensation is intact and symmetrical to light touch on the face.  VII: Smile is asymmetric at baseline with residual right mouth droop from childhood mastoiditis. Able to puff cheeks  more strongly on the left and raise eyebrows but with limited patient cooperation. VIII: Patient is hard of hearing, deaf at baseline in the right ear, hearing aid in the left ear, requires loud vocalization for hearing (but then also asks examiner not to yell).  IX, X: Phonation intact.  XI: Normal sternocleidomastoid and trapezius muscle strength XII: Tongue protrudes midline without fasciculations.  Motor: 4/5 strength with bilateral upper extremities with extensive coaching to assess strength. No drift noted in bilateral upper extremities with multiple attempts due to poor effort. Left lower extremity movement decreased due to pain from left intertrochanteric hip fracture s/p surgery but with some effort at antigravity movement (1/5). 5/5 flexion and extension right ankle.  Right lower extremity otherwise 2/5 strength (also pain limited) and left lower extremity 1/5 strength with very minimal movement due to pain. Tone is normal. Bulk is normal.   Sensation- Intact to light touch bilaterally in all four extremities. Extinction intact.  Coordination: FTN intact bilaterally. HKS unable to be assessed due to limited patient movement of lower extremities and LLE pain s/p surgical repair. No pronator drift in upper extremities with extensive coaching.  DTRs: 1+ right patellar, did not assess left patellar reflex due to pain in left lower extremity and per patient request, bilateral biceps and brachioradialis reflexes 3+ and symmetric.  Plantars: Toes chronically upgoing; withdrawal versus upgoing on assessment. Gait- deferred  1a Level of Conscious.: 1; drowsy 1b LOC Questions: 0 1c LOC Commands: 0 2 Best Gaze: 0 3 Visual: 0 4 Facial Palsy: 1; baseline for patient 5a Motor Arm - left: 0 5b Motor Arm - Right: 0 6a Motor Leg - Left: 3; movement limited secondary to pain 6b Motor Leg - Right: 3; attempts antigravity movement without success- minimal movement of right leg 7 Limb Ataxia: 0 8  Sensory: 0 9 Best Language: 0 10 Dysarthria: 1 11 Extinct. and Inatten.: 0 TOTAL: 9 (no acute change at this time, felt to be all baseline vs. Post-operative effect)  Labs I have reviewed labs in epic and the results pertinent to this consultation are: CBC    Component Value Date/Time   WBC 9.2 04/27/2020 0808   RBC 2.64 (L) 04/27/2020 0808   HGB 7.6 (L) 04/27/2020 0808   HCT 23.0 (L) 04/27/2020 0808   PLT 244 04/27/2020 0808   MCV 87.1 04/27/2020 0808   MCH 28.8 04/27/2020 0808   MCHC 33.0 04/27/2020 0808   RDW 14.3 04/27/2020 0808   LYMPHSABS 1.2 04/24/2020 1628   MONOABS 1.2 (H) 04/24/2020 1628   EOSABS 0.0 04/24/2020 1628   BASOSABS 0.0 04/24/2020 1628    CMP     Component Value Date/Time   NA 138 04/26/2020 0115   K 4.7 04/26/2020 0115  CL 111 04/26/2020 0115   CO2 20 (L) 04/26/2020 0115   GLUCOSE 136 (H) 04/26/2020 0115   BUN 30 (H) 04/26/2020 0115   CREATININE 1.48 (H) 04/26/2020 0115   CALCIUM 7.7 (L) 04/26/2020 0115   PROT 6.6 08/06/2017 1630   ALBUMIN 3.1 (L) 08/07/2017 0845   AST 20 08/06/2017 1630   ALT 17 08/06/2017 1630   ALKPHOS 72 08/06/2017 1630   BILITOT 0.4 08/06/2017 1630   GFRNONAA 35 (L) 04/26/2020 0115   GFRAA 54 (L) 08/11/2017 0336   Lab Results  Component Value Date   INR 1.4 (H) 04/27/2020   INR 1.2 04/26/2020   INR 1.2 04/25/2020    Lab Results  Component Value Date   [id number] 04/27/2020   No results found for: TSH   Imaging I have reviewed the images obtained: MRI examination of the brain IMPRESSION: 1. Small acute infarcts in the right cerebellum, bilateral occipital parietal lobes, and high left precentral gyrus. 2. No acute infarct to correlate with the cranial nerve deficit. Thin slices through the brainstem could not be obtained due to patient condition. 3. Advanced chronic ischemic injury.  Assessment: [name] is an 87 year old female with multiple stroke risk factors, as above, on home coumadin who  presented to the hospital 04/22/2020 after a fall sustaining a left intertrochanteric hip fracture requiring surgical repair. Initially, her INR was 2.3 and she was given Vitamin K pre-operatively to prevent surgical complications. Her hemoglobin post-operatively was 7.0 and she was given 1 unit of packed red blood cells for stabilization. Coumadin was held 04/22/2020 and resumed 04/25/2020 when her INR was 1.2. She has been progressively confused since surgery with delirium versus encephalopathy and her husband noted swallowing impairment 04/26/2020 for which an MRI was ordered revealing small acute infarcts in the right cerebellum, bilateral occipital parietal lobes, and high left precentral gyrus. Etiology likely cardioembolic from atrial fibrillation with lapse in anticoagulation pre-operatively and less likely atherosclerotic with known advanced chronic ischemic injury.   Recommendations:  # Multifocal small strokes, likely cardioemoblic iso known Afib  - Stroke labs: HgbA1c, fasting lipid panel  - MRA head w/o and MRA neck w/ and w/o (carotid duplex ONLY if MRA of inadequate quality) for vessel imaging  - Frequent neuro checks - Echocardiogram - Prophylactic therapy- Antiplatelet med: Aspirin - dose 325mg  PO or 300mg  PR until [facility] resumed if cleared by ortho team - Hold AC for 2 days for risk of hemorrhagic conversion of areas of ischemia, then restart warfarin w/o bridging if patient remains stable - Risk factor modification - Telemetry monitoring - Continue PT, OT  - Speech consult; NPO until swallow screen complete  - Stroke team to follow  # Delirium - Low normal B12, check MMA,  - Empirically supplement 1000 mcg oral daily B12, goal level > 600 - Check TSH - Check UA if patient becomes symptomatic given history of frequent UTIs - try to minimize deliriogenic medications as much as possible (J Am Geriatr Soc. 2012 Apr;60(4):616-31): benzodiazepines, anticholinergics, diphenhydramine,  antihistamines, narcotics, Ambien/Lunesta/Sonata etc. - environmental support for delirium: Lights on during the day, patient up and out of bed as much as is feasible, OT/PT, quiet dimly lit room at night, reorient patient often, provide hearing aides and glasses if patient uses them routinely, minimize sleep disruptions as much as possible overnight.  As much as possible, reorient patient, and have them engage patient in activities, e.g. playing cards. TV should be off or on neutral background music unless patient  engaged and watching. Try to keep interactions with the patient calm and quiet.     Pt seen by NP with attending MD. Note/plan to be edited by MD as needed.  Anibal Henderson, AGAC-NP [facility] Pager: [phone]  Attending Neurologist's note:  I personally saw this patient, gathering history, performing a full neurologic examination, reviewing relevant labs, personally reviewing relevant imaging including MRI brain, and formulated the assessment and plan, adding the note above for completeness and clarity to accurately reflect my thoughts   Lesleigh Noe MD-PhD [facility] [phone]

## 2020-04-27 NOTE — Plan of Care (Signed)

## 2020-04-27 NOTE — Progress Notes (Signed)
ANTICOAGULATION CONSULT NOTE - Initial Consult  Pharmacy Consult for Warfarin Indication: atrial fibrillation  Patient Measurements: Height: 5\' 4"  (162.6 cm) Weight: 85.7 kg (188 lb 15 oz) IBW/kg (Calculated) : 54.7  Vital Signs: Temp: 99.4 F (37.4 C) (01/30 0409) Temp Source: Oral (01/30 0409) BP: 170/62 (01/30 0409) Pulse Rate: 87 (01/30 0409)  Labs: Recent Labs    04/24/20 0827 04/24/20 1628 04/25/20 0741 04/26/20 0115 04/27/20 0138  HGB 7.0* 7.3* 7.0* 7.0*  --   HCT 22.0* 23.2* 21.4* 22.6*  --   PLT 210 188 175 189  --   LABPROT 15.7*  --  15.1 14.8 16.9*  INR 1.3*  --  1.2 1.2 1.4*  CREATININE 1.85*  --  1.75* 1.48*  --     Estimated Creatinine Clearance: 30 mL/min (A) (by C-G formula based on SCr of 1.48 mg/dL (H)).   Assessment: 32 YOF on warfarin PTA for hx Afib s/p L-femur fracture repair on 1/26. Pharmacy consulted to start Warfarin without a bridge when Hgb stable.   Hgb was down to 7 post-op from 8.9. Warfarin initiation was held. Hgb stable at 7 yesterday. Discussed with ortho Ricci Barker) who has okayed Warfarin to resume 1/28. Hgb remains stable at 7 this AM; PLTs WNL. Pending CBC today.  Vit K 5 mg given for reversal on 1/25. PTA dose was 5 mg daily EXCEPT for 7.5 mg on Tues/Thurs. INR remains subtherapeutic today as expected; will continue PTA dosing for now (5mg  warfarin today). Expect INR to increase over next several days.   Goal of Therapy:  INR 2-3 Monitor platelets by anticoagulation protocol: Yes   Plan:  - Warfarin 5 mg x 1 dose at 1600 today - Daily PT/INR, CBC q72h - Will continue to monitor for any signs/symptoms of bleeding and will follow up with PT/INR in the a.m.    Thank you for allowing pharmacy to be a part of this patient's care.  Alfonse Spruce, PharmD PGY2 ID Pharmacy Resident Phone between 7 am - 3:30 pm: [phone]  Please check AMION for all [place] phone numbers After 10:00 PM, call [place] [phone]

## 2020-04-27 NOTE — Plan of Care (Signed)
  Problem: Health Behavior/Discharge Planning: Goal: Ability to manage health-related needs will improve Outcome: Progressing   

## 2020-04-28 DIAGNOSIS — I6340 Cerebral infarction due to embolism of unspecified cerebral artery: Secondary | ICD-10-CM | POA: Insufficient documentation

## 2020-04-28 DIAGNOSIS — I6310 Cerebral infarction due to embolism of unspecified precerebral artery: Secondary | ICD-10-CM

## 2020-04-28 DIAGNOSIS — S72145A Nondisplaced intertrochanteric fracture of left femur, initial encounter for closed fracture: Secondary | ICD-10-CM | POA: Diagnosis not present

## 2020-04-28 LAB — BASIC METABOLIC PANEL
Anion gap: 9 (ref 5–15)
BUN: 25 mg/dL — ABNORMAL HIGH (ref 8–23)
CO2: 20 mmol/L — ABNORMAL LOW (ref 22–32)
Calcium: 8 mg/dL — ABNORMAL LOW (ref 8.9–10.3)
Chloride: 110 mmol/L (ref 98–111)
Creatinine, Ser: 1.26 mg/dL — ABNORMAL HIGH (ref 0.44–1.00)
GFR, Estimated: 42 mL/min — ABNORMAL LOW (ref >60)
Glucose, Bld: 106 mg/dL — ABNORMAL HIGH (ref 70–99)
Potassium: 3.7 mmol/L (ref 3.5–5.1)
Sodium: 139 mmol/L (ref 135–145)

## 2020-04-28 LAB — HEMOGLOBIN AND HEMATOCRIT, BLOOD
HCT: 27.4 % — ABNORMAL LOW (ref 36.0–46.0)
Hemoglobin: 8.7 g/dL — ABNORMAL LOW (ref 12.0–15.0)

## 2020-04-28 LAB — LIPID PANEL
Cholesterol: 100 mg/dL (ref 0–200)
HDL: 36 mg/dL — ABNORMAL LOW (ref >40)
LDL Cholesterol: 38 mg/dL (ref 0–99)
Total CHOL/HDL Ratio: 2.8 RATIO
Triglycerides: 129 mg/dL (ref <150)
VLDL: 26 mg/dL (ref 0–40)

## 2020-04-28 LAB — CBC
HCT: 21.8 % — ABNORMAL LOW (ref 36.0–46.0)
Hemoglobin: 6.8 g/dL — CL (ref 12.0–15.0)
MCH: 28.1 pg (ref 26.0–34.0)
MCHC: 31.2 g/dL (ref 30.0–36.0)
MCV: 90.1 fL (ref 80.0–100.0)
Platelets: 252 10*3/uL (ref 150–400)
RBC: 2.42 MIL/uL — ABNORMAL LOW (ref 3.87–5.11)
RDW: 14.2 % (ref 11.5–15.5)
WBC: 7.1 10*3/uL (ref 4.0–10.5)
nRBC: 0 % (ref 0.0–0.2)

## 2020-04-28 LAB — HEMOGLOBIN A1C
Hgb A1c MFr Bld: 5.6 % (ref 4.8–5.6)
Mean Plasma Glucose: 114.02 mg/dL

## 2020-04-28 LAB — PREPARE RBC (CROSSMATCH)

## 2020-04-28 LAB — PROTIME-INR
INR: 1.6 — ABNORMAL HIGH (ref 0.8–1.2)
Prothrombin Time: 18.2 seconds — ABNORMAL HIGH (ref 11.4–15.2)

## 2020-04-28 MED ORDER — WARFARIN SODIUM 7.5 MG PO TABS
7.5000 mg | ORAL_TABLET | Freq: Once | ORAL | Status: AC
  Administered 2020-04-28: 7.5 mg via ORAL
  Filled 2020-04-28: qty 1

## 2020-04-28 MED ORDER — PANTOPRAZOLE SODIUM 40 MG PO TBEC
40.0000 mg | DELAYED_RELEASE_TABLET | Freq: Every day | ORAL | Status: DC
  Administered 2020-04-29: 40 mg via ORAL
  Filled 2020-04-28: qty 1

## 2020-04-28 MED ORDER — SODIUM CHLORIDE 0.9% IV SOLUTION: Freq: Once | INTRAVENOUS | Status: DC

## 2020-04-28 MED ORDER — WARFARIN - PHARMACIST DOSING INPATIENT: Freq: Every day | Status: DC

## 2020-04-28 NOTE — Progress Notes (Signed)
Occupational Therapy Treatment Patient Details Name: [name] MRN: [id number] DOB: Nov 05, 1933 Today's Date: 04/28/2020    History of present illness 87 y.o. female past medical history of A. fib on Coumadin, hypertension, osteoporosis, presenting to the emergency department from home with left hip injury. Patient sustained L intratrochanteric hip fx. Patient s/p IM nail L femur on 1/26. her husband noted swallowing impairment 04/26/2020 for which an MRI was ordered revealing small acute infarcts in the right cerebellum, bilateral occipital parietal lobes, and high left precentral gyrus.   OT comments  Pt with improved ability to participate in therapy session today. Fatigues easily, but works hard. Continues to require +2 assistance for mobility, but able to transfer to chair and stood with sara stedy. Pt is now able to self feed with set up and groom with min assist in sitting. Continue to recommend CIR.  Follow Up Recommendations  CIR    Equipment Recommendations  None recommended by OT    Recommendations for Other Services      Precautions / Restrictions Precautions Precautions: Fall Restrictions LLE Weight Bearing: Weight bearing as tolerated       Mobility Bed Mobility Overal bed mobility: Needs Assistance Bed Mobility: Rolling;Sidelying to Sit Rolling: Max assist Sidelying to sit: Max assist;+2 for safety/equipment       General bed mobility comments: Noatble increased initiation and paticipation in mobility tasks, assist for LEs over EOB and to raise trunk, assist for hips to EOB using bed pad  Transfers Overall transfer level: Needs assistance Equipment used: Rolling walker (2 wheeled);Ambulation equipment used Transfers: Sit to/from [facility] Sit to Stand: +2 physical assistance;Max assist Stand pivot transfers: +2 physical assistance;Max assist       General transfer comment: use of pad under hips and gait belt to assist hips up, cues for  hand placement with RW, posterior bias requring +2 max assist to correct in standing, stedying assist as pt pivoted to chair without ability to pick up L foot    Balance Overall balance assessment: Needs assistance Sitting-balance support: Bilateral upper extremity supported;Feet supported Sitting balance-Leahy Scale: Fair     Standing balance support: Bilateral upper extremity supported;During functional activity Standing balance-Leahy Scale: Poor Standing balance comment: able to stand in sara stedy from flaps with min guard assist x 2                           ADL either performed or assessed with clinical judgement   ADL Overall ADL's : Needs assistance/impaired Eating/Feeding: Minimal assistance;Bed level Eating/Feeding Details (indicate cue type and reason): assist to open containers, set up tray Grooming: Oral care;Brushing hair;Minimal assistance;Sitting                       Toileting- Clothing Manipulation and Hygiene: Total assistance;+2 for physical assistance;Bed level         General ADL Comments: pt able to participate maximally in therapy session this visit, works hard, but Gaffer      Cognition Arousal/Alertness: Awake/alert Behavior During Therapy: WFL for tasks assessed/performed Overall Cognitive Status: Difficult to assess Area of Impairment: Following commands                       Following Commands: Follows one step commands with increased time       General Comments:  Much improved compared to last session        Exercises     Shoulder Instructions       General Comments      Pertinent Vitals/ Pain       Pain Assessment: Faces Faces Pain Scale: Hurts even more Pain Location: L thigh; briefly increases to where she moans/calls out with movement; the pain subsides quickly, and after getting inthe chair, she reports did not have pain Pain Descriptors / Indicators:  Grimacing Pain Intervention(s): Monitored during session;Repositioned;Ice applied  Home Living                                          Prior Functioning/Environment              Frequency  Min 2X/week        Progress Toward Goals  OT Goals(current goals can now be found in the care plan section)  Progress towards OT goals: Progressing toward goals  Acute Rehab OT Goals Patient Stated Goal: get stronger OT Goal Formulation: With family Time For Goal Achievement: 05/08/20 Potential to Achieve Goals: [place] Discharge plan remains appropriate    Co-evaluation    PT/OT/SLP Co-Evaluation/Treatment: Yes Reason for Co-Treatment: For patient/therapist safety PT goals addressed during session: Mobility/safety with mobility OT goals addressed during session: ADL's and self-care      AM-PAC OT "6 Clicks" Daily Activity     Outcome Measure   Help from another person eating meals?: A Little Help from another person taking care of personal grooming?: A Little Help from another person toileting, which includes using toliet, bedpan, or urinal?: Total Help from another person bathing (including washing, rinsing, drying)?: A Lot Help from another person to put on and taking off regular upper body clothing?: A Lot Help from another person to put on and taking off regular lower body clothing?: Total 6 Click Score: 12    End of Session    OT Visit Diagnosis: Unsteadiness on feet (R26.81);Pain;Muscle weakness (generalized) (M62.81)   Activity Tolerance Patient tolerated treatment well   Patient Left in chair;with call bell/phone within reach;with chair alarm set;with family/visitor present   Nurse Communication          Time: [id number] OT Time Calculation (min): 42 min  Charges: OT General Charges $OT Visit: 1 Visit OT Treatments $Self Care/Home Management : 8-22 mins $Therapeutic Activity: 8-22 mins  [name], OTR/L Acute  Rehabilitation Services Pager: [phone] Office: [phone]  [name] 04/28/2020, 9:51 AM

## 2020-04-28 NOTE — Plan of Care (Signed)
  Problem: Health Behavior/Discharge Planning: Goal: Ability to manage health-related needs will improve Outcome: Progressing   

## 2020-04-28 NOTE — Consult Note (Signed)
Physical Medicine and Rehabilitation Consult Reason for Consult: Altered mental status with decreased functional mobility Referring Physician: [facility]   HPI: [name] is a 87 y.o. right-handed female with history of atrial fibrillation on chronic Coumadin, CKD stage III with baseline 1.46, breast cancer with bilateral mastectomy, TIA without residual weakness, recurrent UTI, bilateral TKR as well as left femur fracture May 2019 with ORIF.  Per chart review lives with spouse 1 level home ramped entrance.  Ambulates with a rolling walker.  Requires some assistance for lower body ADLs.  Presented 04/22/2020 after a fall without loss of consciousness landing on her left hip.  She stated she leaned too far forward falling out of her wheelchair.  Admission chemistries hemoglobin 10.1 BUN 30 creatinine 1.46 urinalysis negative nitrite.  X-rays imaging revealed left intertrochanteric femur fracture as well as healed left distal femur fracture.  Underwent nailing of left intertrochanteric femur fracture removal of hardware left femur 04/23/2020 per Dr. Doreatha Martin.  Weightbearing as tolerated left lower extremity.  Postoperative chronic Coumadin resumed.  Acute blood loss anemia 7.0 and monitored.  Creatinine stable 1.75.  On 04/27/2020 patient with increased confusion difficulty in swallowing.  MRI of the brain completed showing small acute infarcts in the right cerebellum bilateral occipital parietal lobes and high left precentral gyrus.  Patient did not receive TPA.  MRA of head and neck no large vessel occlusion.  Echocardiogram with ejection fraction of 70 to 75% no wall motion abnormalities.  Patient's chronic Coumadin currently on hold maintain on aspirin 325 mg daily.  Tolerating a regular consistency diet.   Physical Medicine & Rehabilitation was consulted to assess candidacy for CIR given current impaired mobility and ADLs.   Review of Systems  HENT: Positive for hearing loss.   Cardiovascular:  Positive for palpitations.  Gastrointestinal: Positive for diarrhea.       GERD  Genitourinary: Negative for dysuria, flank pain and hematuria.  Musculoskeletal: Positive for joint pain.  Psychiatric/Behavioral: Positive for depression. The patient has insomnia.    Past Medical History:  Diagnosis Date  . Arthritis    "fingers, hips, feet, ankles" (11/10/2012)  . Asthma   . Atrial fibrillation (HCC)    CHRONIC COUMADIN  . Breast cancer ([place]) 1990's   "cancer on one side; precancerous tissue on the other" (11/10/2012)  . Chronic bronchitis ([place])    "multiple times; not in a long time" (11/10/2012  . Depression   . GERD (gastroesophageal reflux disease)   . Hearing impaired   . Hypertension   . Irritable bowel   . Lymphedema    HX OF - IN LEFT ARM--NO NEEDLES OR B/P'S LEFT ARM  . Macular degeneration    "BEGINNINGS OF" MACULAR DEGENERATION  . Osteoporosis   . Pneumonia    "multiple times; not in a long time" (11/10/2012)  . Recurrent UTI   . Sleep apnea    "dx'd w/it; don't wear mask or anything" (11/10/2012)  . Stroke ([place]) ~ 2005   HX OF TIA-NO RESIDUAL PROBLEM--PARALYSIS RT SIDE FACE /PT'S MOUTH DROOPS-AND LOSS OF HEARING RT EAR --SINCE EAR SURGERY AS A CHILD   . UTI (lower urinary tract infection)    FREQUENT   Past Surgical History:  Procedure Laterality Date  . BREAST BIOPSY Bilateral 1990's  . Dacryocystorhinostomy  02/18/2016  . INNER EAR SURGERY Right    MULTIPLE EAR SURGERIES,  . INTRAMEDULLARY (IM) NAIL INTERTROCHANTERIC Left 04/23/2020   Procedure: INTRAMEDULLARY (IM) NAIL INTERTROCHANTRIC;  Surgeon: Shona Needles,  MD;  Location: [facility];  Service: Orthopedics;  Laterality: Left;  . JOINT REPLACEMENT    . KNEE ARTHROSCOPY  04/07/2012   Procedure: ARTHROSCOPY KNEE;  Surgeon: Gearlean Alf, MD;  Location[facility];  Service: Orthopedics;  Laterality: Left;  WITH SYNOVECTOMY  . MASTECTOMY Bilateral 1990's  . MASTOIDECTOMY Right 1942  . ORIF FEMUR  FRACTURE Left 08/08/2017   Procedure: OPEN REDUCTION INTERNAL FIXATION (ORIF) DISTAL FEMUR FRACTURE;  Surgeon: Shona Needles, MD;  Location: [facility];  Service: Orthopedics;  Laterality: Left;  . TONSILLECTOMY    . TOTAL KNEE ARTHROPLASTY  09/20/2011   LEFT TOTAL KNEE ARTHROPLASTY;  Surgeon: Gearlean Alf, MD;  Location: [facility];  Service: Orthopedics;  Laterality: Left;  . TOTAL KNEE ARTHROPLASTY  2006   Family History  Problem Relation Age of Onset  . CAD Mother   . Stroke Mother   . CAD Father    Social History:  reports that she has never smoked. She has never used smokeless tobacco. She reports that she does not drink alcohol and does not use drugs. Allergies:  Allergies  Allergen Reactions  . Pradaxa [Dabigatran Etexilate Mesylate] Other (See Comments)    INTERNAL BLEEDING  . Clindamycin/Lincomycin Other (See Comments)    PT STATES HER DOCTOR TOLD HER NOT TO TAKE CLINDAMYCIN BECAUSE SHE GOT C-DIFF AFTER TAKING AMPICILLIN- "tolerated in 2022" (per spouse)  . Latex Other (See Comments)    Blisters   . Ampicillin Other (See Comments)    C-DIF AFTER TAKING AMPICILLIN- "tolerated in 2022" (per husband) Has patient had a PCN reaction causing immediate rash, facial/tongue/throat swelling, SOB or lightheadedness with hypotension: No Has patient had a PCN reaction causing severe rash involving mucus membranes or skin necrosis: No Has patient had a PCN reaction that required hospitalization: No Has patient had a PCN reaction occurring within the last 10 years: No If all of the above answers are "NO", then may proceed with Cephalosporin u  . Sulfamethoxazole Diarrhea   Medications Prior to Admission  Medication Sig Dispense Refill  . atorvastatin (LIPITOR) 20 MG tablet Take 20 mg by mouth daily after supper.     . chlorthalidone (HYGROTON) 50 MG tablet Take 50 mg by mouth daily.     Marland Kitchen estradiol (ESTRACE) 0.1 MG/GM vaginal cream Place 1 Applicatorful vaginally 2 (two) times a week.    .  loperamide (IMODIUM) 2 MG capsule Take 2-4 mg by mouth 5 (five) times daily as needed for diarrhea or loose stools.     Marland Kitchen losartan (COZAAR) 100 MG tablet Take 100 mg by mouth daily.    . metoprolol succinate (TOPROL-XL) 25 MG 24 hr tablet Take 1 tablet (25 mg total) by mouth daily. 30 tablet 11  . Multiple Vitamins-Minerals (ONE-A-DAY PROACTIVE 65+) TABS Take 1 tablet by mouth daily with breakfast.    . Multiple Vitamins-Minerals (PRESERVISION AREDS) CAPS Take 1 capsule by mouth 2 (two) times daily.    Vladimir Faster Glyc-Propyl Glyc PF (SYSTANE ULTRA PF) 0.4-0.3 % SOLN Place 1 drop into both eyes at bedtime.    . Probiotic Product (VSL#3 PO) Take 1 capsule by mouth in the morning.    . saccharomyces boulardii (FLORASTOR) 250 MG capsule Take 500 mg by mouth every evening.    . warfarin (COUMADIN) 5 MG tablet Take 5-7.5 mg by mouth See admin instructions. Take 5 mg by mouth with supper (evening meal) on Sun/Mon/Wed/Fri/Sat and 7.5 mg on Tues/Thurs      Home:  Home Living Family/patient expects to be discharged to:: Private residence Living Arrangements: Spouse/significant other Available Help at Discharge: Family,Available 24 hours/day Type of Home: House Home Access: Ramped entrance Home Layout: Two level,Able to live on main level with bedroom/bathroom Alternate Level Stairs-Number of Steps: full flight Alternate Level Stairs-Rails: Right Bathroom Shower/Tub: Health visitorWalk-in shower Bathroom Toilet: Handicapped height Bathroom Accessibility: Yes Home Equipment: Environmental consultantWalker - 2 wheels,Bedside commode,Shower seat,Toilet riser,Hand held shower head,Wheelchair - Glass blower/designermanual,Other (comment) (lift chair)  Lives With: Spouse  Functional History: Prior Function Level of Independence: Needs assistance Gait / Transfers Assistance Needed: ambulates with RW at baseline ADL's / Homemaking Assistance Needed: requires assistance for LB dressing and IADL Functional Status:  Mobility: Bed Mobility Overal bed mobility:  Needs Assistance Bed Mobility: Rolling,Supine to Sit,Sit to Supine Rolling: Total assist,+2 for physical assistance,+2 for safety/equipment Supine to sit: Total assist,+2 for physical assistance,+2 for safety/equipment Sit to supine: Total assist,+2 for physical assistance,+2 for safety/equipment General bed mobility comments: assist for all aspects Transfers Overall transfer level: Needs assistance Equipment used: Rolling walker (2 wheeled) Transfers: Sit to/from Stand Sit to Stand: Max assist,+2 physical assistance,+2 safety/equipment,From elevated surface General transfer comment: x 2 from elevated bed with use of bed pad under hips      ADL: ADL General ADL Comments: total assist due to lethargy, incontinent of bowel  Cognition: Cognition Overall Cognitive Status: Difficult to assess Orientation Level: Oriented X4 Cognition Arousal/Alertness: Lethargic Behavior During Therapy: Flat affect Overall Cognitive Status: Difficult to assess Area of Impairment: Attention,Following commands Current Attention Level: Selective Following Commands: Follows one step commands with increased time,Follows multi-step commands inconsistently General Comments: difficult to assess due to lethargy and HOH. Husband reports she is talkative at baseline, however this session patient said little to no words  Blood pressure (!) 149/64, pulse 82, temperature 98.7 F (37.1 C), temperature source Oral, resp. rate 17, height 5\' 4"  (1.626 m), weight 85.7 kg, SpO2 94 %. Physical Exam  General: Alert, No apparent distress HEENT: Head is normocephalic, extremely hard of hearing in right ear Neck: Supple without JVD or lymphadenopathy Heart: Reg rate and rhythm. No murmurs rubs or gallops Chest: CTA bilaterally without wheezes, rales, or rhonchi; no distress Abdomen: Soft, non-tender, non-distended, bowel sounds positive. Extremities: No clubbing, cyanosis, or edema. Pulses are 2+ Psych: Pt's affect is  appropriate. Pt is cooperative Skin: Left hip Incision C/D/I Neurological:     Comments: Patient is a bit lethargic but arousable.  Makes eye contact with examiner.  She is dysarthric but intelligible.  Provides her name and age. BUE 4/5. 1/5 LLE and 2/5 RLE (both pain limited)   Results for orders placed or performed during the hospital encounter of 04/22/20 (from the past 24 hour(s))  CBC     Status: Abnormal   Collection Time: 04/27/20  8:08 AM  Result Value Ref Range   WBC 9.2 4.0 - 10.5 K/uL   RBC 2.64 (L) 3.87 - 5.11 MIL/uL   Hemoglobin 7.6 (L) 12.0 - 15.0 g/dL   HCT 16.123.0 (L) 09.636.0 - 04.546.0 %   MCV 87.1 80.0 - 100.0 fL   MCH 28.8 26.0 - 34.0 pg   MCHC 33.0 30.0 - 36.0 g/dL   RDW 40.914.3 81.111.5 - 91.415.5 %   Platelets 244 150 - 400 K/uL   nRBC 0.0 0.0 - 0.2 %  Basic metabolic panel     Status: Abnormal   Collection Time: 04/27/20  8:08 AM  Result Value Ref Range   Sodium 139 135 -  145 mmol/L   Potassium 3.9 3.5 - 5.1 mmol/L   Chloride 109 98 - 111 mmol/L   CO2 20 (L) 22 - 32 mmol/L   Glucose, Bld 126 (H) 70 - 99 mg/dL   BUN 22 8 - 23 mg/dL   Creatinine, Ser 1.20 (H) 0.44 - 1.00 mg/dL   Calcium 8.3 (L) 8.9 - 10.3 mg/dL   GFR, Estimated 45 (L) >60 mL/min   Anion gap 10 5 - 15  TSH     Status: None   Collection Time: 04/27/20 11:05 AM  Result Value Ref Range   TSH 1.532 0.350 - 4.500 uIU/mL  Protime-INR     Status: Abnormal   Collection Time: 04/28/20  2:20 AM  Result Value Ref Range   Prothrombin Time 18.2 (H) 11.4 - 15.2 seconds   INR 1.6 (H) 0.8 - 1.2  Hemoglobin A1c     Status: None   Collection Time: 04/28/20  2:20 AM  Result Value Ref Range   Hgb A1c MFr Bld 5.6 4.8 - 5.6 %   Mean Plasma Glucose 114.02 mg/dL  Lipid panel     Status: Abnormal   Collection Time: 04/28/20  2:20 AM  Result Value Ref Range   Cholesterol 100 0 - 200 mg/dL   Triglycerides 129 <150 mg/dL   HDL 36 (L) >40 mg/dL   Total CHOL/HDL Ratio 2.8 RATIO   VLDL 26 0 - 40 mg/dL   LDL Cholesterol 38 0 -  99 mg/dL   MR ANGIO HEAD WO CONTRAST  Result Date: 04/27/2020 CLINICAL DATA:  Stroke follow-up EXAM: MRA HEAD WITHOUT CONTRAST TECHNIQUE: Angiographic images of the Circle of Willis were obtained using MRA technique without intravenous contrast. COMPARISON:  MRI head 04/27/2020 FINDINGS: Suboptimal image quality due to patient motion. In addition, there is venous contrast present. I discussed with the technologist from the study who insisted no contrast was injected prior to the MRA of the head Left vertebral artery dominant and supplies the basilar. Small right vertebral artery patent to the basilar. Basilar is irregular but patent. Posterior cerebral arteries are patent bilaterally with mild atherosclerotic irregularity. Internal carotid artery patent through the cavernous segment. There is considerable cavernous sinus contrast obscuring the cavernous carotid. Anterior and middle cerebral arteries patent bilaterally. Moderate stenosis in the anterior and middle cerebral arteries bilaterally. No large vessel occlusion or aneurysm. IMPRESSION: Suboptimal study due to venous contrast and patient motion Moderate intracranial atherosclerotic disease. No large vessel occlusion. Electronically Signed   By: Franchot Gallo M.D.   On: 04/27/2020 17:00   MR ANGIO NECK W WO CONTRAST  Result Date: 04/27/2020 CLINICAL DATA:  Stroke EXAM: MRA NECK WITHOUT AND WITH CONTRAST TECHNIQUE: Multiplanar and multiecho pulse sequences of the neck were obtained without and with intravenous contrast. Angiographic images of the neck were obtained using MRA technique without and with intravenous contrast. CONTRAST:  8.11mL GADAVIST GADOBUTROL 1 MMOL/ML IV SOLN COMPARISON:  None. FINDINGS: Suboptimal study due to extensive venous contamination. During contrast infusion, the MRI equipment did not automatically begin scanning during the arterial phase. Scanning was started manually during the venous phase. In addition, there is motion on  the study degrading image quality. Carotid artery is patent bilaterally. Both vertebral arteries are patent. IMPRESSION: Limited study due to venous phase scanning and patient motion. No large vessel occlusion in the neck. Electronically Signed   By: Franchot Gallo M.D.   On: 04/27/2020 17:03   MR BRAIN WO CONTRAST  Result Date: 04/27/2020 CLINICAL DATA:  Cranial neuropathy 9 EXAM: MRI HEAD WITHOUT CONTRAST TECHNIQUE: Multiplanar, multiecho pulse sequences of the brain and surrounding structures were obtained without intravenous contrast. COMPARISON:  01/09/2017 FINDINGS: Brain: Subcentimeter acute infarcts in the right cerebellum, bilateral occipitoparietal cortex, and high left precentral gyrus. There has been a large remote left cerebellar infarction. Small remote occipital parietal infarcts. Confluent chronic small vessel ischemia in the cerebral white matter. Chronic small vessel ischemia to a milder degree in the brainstem where there is no visible acute infarct. Chronic ischemic injury to the deep gray nuclei accentuated by dilated perivascular spaces. Remote hypertensive pattern micro hemorrhages in the deep cerebellum and supratentorial brain. Generalized brain atrophy. No acute hemorrhage, hydrocephalus, or masslike finding. Vascular: Preserved flow voids Skull and upper cervical spine: Normal marrow signal. C4-5 facet ankylosis. Sinuses/Orbits: Bilateral cataract resection. Minor mucosal thickening in paranasal sinuses. Right mastoidectomy. IMPRESSION: 1. Small acute infarcts in the right cerebellum, bilateral occipital parietal lobes, and high left precentral gyrus. 2. No acute infarct to correlate with the cranial nerve deficit. Thin slices through the brainstem could not be obtained due to patient condition. 3. Advanced chronic ischemic injury. Electronically Signed   By: Monte Fantasia M.D.   On: 04/27/2020 06:00   DG CHEST PORT 1 VIEW  Result Date: 04/26/2020 CLINICAL DATA:  Cough. EXAM:  PORTABLE CHEST 1 VIEW COMPARISON:  04/22/2020 FINDINGS: Stable enlarged cardiac silhouette and tortuous and calcified thoracic aorta. The lungs remain clear with normal vascularity. Stable mild interstitial prominence without Kerley lines. Diffuse osteopenia. IMPRESSION: No acute abnormality. Stable cardiomegaly and mild chronic interstitial lung disease. Electronically Signed   By: Claudie Revering M.D.   On: 04/26/2020 12:18   ECHOCARDIOGRAM COMPLETE  Result Date: 04/27/2020    ECHOCARDIOGRAM REPORT   Patient Name:   NANDITA LIGHTLE Date of Exam: 04/27/2020 Medical Rec #:  [id number]    Height:       64.0 in Accession #:    [id number]   Weight:       188.9 lb Date of Birth:  01/15/1934    BSA:          1.910 m Patient Age:    45 years     BP:           170/62 mmHg Patient Gender: F            HR:           87 bpm. Exam Location:  Inpatient Procedure: 2D Echo, Color Doppler and Cardiac Doppler Indications:    Stroke 434.91 / I163.9  History:        Patient has prior history of Echocardiogram examinations, most                 recent 08/07/2017. Stroke, Arrythmias:Atrial Fibrillation; Risk                 Factors:Hypertension and Dyslipidemia. GERD.  Sonographer:    Darlina Sicilian RDCS Referring Phys: [id number] [place]  Sonographer Comments: Exam terminated per patient's request. IMPRESSIONS  1. Left ventricular ejection fraction, by estimation, is 70 to 75%. The left ventricle has hyperdynamic function. The left ventricle has no regional wall motion abnormalities. There is mild concentric left ventricular hypertrophy. Left ventricular diastolic function could not be evaluated.  2. Right ventricular systolic function is normal. The right ventricular size is normal. There is normal pulmonary artery systolic pressure.  3. Left atrial size was moderately dilated.  4. The mitral valve is normal in structure. No evidence  of mitral valve regurgitation. No evidence of mitral stenosis. Moderate mitral annular  calcification.  5. The aortic valve is tricuspid. Aortic valve regurgitation is mild. Mild aortic valve sclerosis is present, with no evidence of aortic valve stenosis.  6. The inferior vena cava is normal in size with greater than 50% respiratory variability, suggesting right atrial pressure of 3 mmHg. Conclusion(s)/Recommendation(s): Incomplete exam. Further imaging declined by the patient. Unable to evaluate for atrial shunting or assess diastolic function. FINDINGS  Left Ventricle: Left ventricular ejection fraction, by estimation, is 70 to 75%. The left ventricle has hyperdynamic function. The left ventricle has no regional wall motion abnormalities. The left ventricular internal cavity size was normal in size. There is mild concentric left ventricular hypertrophy. Left ventricular diastolic function could not be evaluated. Right Ventricle: The right ventricular size is normal. No increase in right ventricular wall thickness. Right ventricular systolic function is normal. There is normal pulmonary artery systolic pressure. The tricuspid regurgitant velocity is 2.78 m/s, and  with an assumed right atrial pressure of 3 mmHg, the estimated right ventricular systolic pressure is [id number] mmHg. Left Atrium: Left atrial size was moderately dilated. Right Atrium: Right atrial size was normal in size. Pericardium: There is no evidence of pericardial effusion. Mitral Valve: The mitral valve is normal in structure. Moderate mitral annular calcification. No evidence of mitral valve regurgitation. No evidence of mitral valve stenosis. Tricuspid Valve: The tricuspid valve is normal in structure. Tricuspid valve regurgitation is not demonstrated. No evidence of tricuspid stenosis. Aortic Valve: The aortic valve is tricuspid. Aortic valve regurgitation is mild. Mild aortic valve sclerosis is present, with no evidence of aortic valve stenosis. Pulmonic Valve: The pulmonic valve was grossly normal. Pulmonic valve regurgitation is  not visualized. No evidence of pulmonic stenosis. Aorta: The aortic root is normal in size and structure. Venous: The inferior vena cava is normal in size with greater than 50% respiratory variability, suggesting right atrial pressure of 3 mmHg. IAS/Shunts: The interatrial septum was not assessed.  LEFT VENTRICLE PLAX 2D LVIDd:         4.40 cm LVIDs:         2.90 cm LV PW:         1.20 cm LV IVS:        1.30 cm LVOT diam:     2.10 cm LVOT Area:     3.46 cm  LEFT ATRIUM           Index LA diam:      1.70 cm 0.89 cm/m LA Vol (A4C): 98.2 ml 51.42 ml/m   AORTA Ao Root diam: 3.70 cm Ao Asc diam:  3.40 cm TRICUSPID VALVE TR Peak grad:   30.9 mmHg TR Vmax:        278.00 cm/s  SHUNTS Systemic Diam: 2.10 cm Dani Gobble Croitoru MD Electronically signed by Sanda Klein MD Signature Date/Time: 04/27/2020/2:02:17 PM    Final      Assessment/Plan: Diagnosis: Multifocal small strokes, likely cardioembolic 1. Does the need for close, 24 hr/day medical supervision in concert with the patient's rehab needs make it unreasonable for this patient to be served in a less intensive setting? Yes 2. Co-Morbidities requiring supervision/potential complications:  1. Delirium: currently undergoing workup, appreciate neurology recommendations 2. Left intertrochanteric hip fracture: WBAT, fall precautions 3. Paroxysmal afib- warfarin restarted, pharmacy note reviewed 4. Obesity (BMI 32.43) 5. Extremely hard of hearing: needs right hearing aide.  3. Due to bladder management, bowel management, safety, skin/wound care, disease management, medication  administration, pain management and patient education, does the patient require 24 hr/day rehab nursing? Yes 4. Does the patient require coordinated care of a physician, rehab nurse, therapy disciplines of PT, OT to address physical and functional deficits in the context of the above medical diagnosis(es)? Yes Addressing deficits in the following areas: balance, endurance, locomotion,  strength, transferring, bowel/bladder control, bathing, dressing, feeding, grooming, toileting, cognition and psychosocial support 5. Can the patient actively participate in an intensive therapy program of at least 3 hrs of therapy per day at least 5 days per week? Yes 6. The potential for patient to make measurable gains while on inpatient rehab is good 7. Anticipated functional outcomes upon discharge from inpatient rehab are min assist  with PT, min assist with OT 8. Estimated rehab length of stay to reach the above functional goals is: 18-20 days 9. Anticipated discharge destination: Home 10. Overall Rehab/Functional Prognosis: excellent  RECOMMENDATIONS: This patient's condition is appropriate for continued rehabilitative care in the following setting: CIR Patient has agreed to participate in recommended program. Yes Note that insurance prior authorization may be required for reimbursement for recommended care.  Comment: Thank you for this consult. Admission coordinator to follow.   I have personally performed a face to face diagnostic evaluation, including, but not limited to relevant history and physical exam findings, of this patient and developed relevant assessment and plan.  Additionally, I have reviewed and concur with the physician assistant's documentation above.  Leeroy Cha, MD  Lavon Paganini [place], PA-C 04/28/2020

## 2020-04-28 NOTE — Progress Notes (Signed)
Inpatient Rehab Admissions Coordinator:   Met with pt and spouse at bedside.  I have no beds available for pt to admit to CIR today but will continue to follow for potential admission this week pending bed availability.   Caitlin Warren, PT, DPT Admissions Coordinator [phone] 04/28/20  2:51 PM   

## 2020-04-28 NOTE — Progress Notes (Signed)
Physical Therapy Treatment Patient Details Name: [name] MRN: [id number] DOB: 10-Jun-1933 Today's Date: 04/28/2020    History of Present Illness 87 y.o. female past medical history of A. fib on Coumadin, hypertension, osteoporosis, presenting to the emergency department from home with left hip injury. Patient sustained L intratrochanteric hip fx. Patient s/p IM nail L femur on 1/26. her husband noted swallowing impairment 04/26/2020 for which an MRI was ordered revealing small acute infarcts in the right cerebellum, bilateral occipital parietal lobes, and high left precentral gyrus.    PT Comments    Continuing work on functional mobility and activity tolerance;  Significant improvements in ability to participate; Mod/Max assist to sit up to EOB; 2 person max assist to stand and take pivot steps with RW to get to recliner; Practiced standing with sara stedy as well; Fatigued at end of session, but working hard; Continue to recommend comprehensive inpatient rehab (CIR) for post-acute therapy needs.    Follow Up Recommendations  CIR;Supervision/Assistance - 24 hour (Significant improvements in ability to participate)     Equipment Recommendations  Rolling walker with 5" wheels;3in1 (PT)    Recommendations for Other Services Rehab consult     Precautions / Restrictions Precautions Precautions: Fall Restrictions LLE Weight Bearing: Weight bearing as tolerated    Mobility  Bed Mobility Overal bed mobility: Needs Assistance Bed Mobility: Rolling;Sidelying to Sit Rolling: Max assist Sidelying to sit: Max assist;+2 for safety/equipment       General bed mobility comments: Noatble increased initiation and paticipation in mobility tasks, assist for LEs over EOB and to raise trunk, assist for hips to EOB using bed pad  Transfers Overall transfer level: Needs assistance Equipment used: Rolling walker (2 wheeled);Ambulation equipment used Transfers: Sit to/from [employer] Sit to Stand: +2 physical assistance;Max assist Stand pivot transfers: +2 physical assistance;Max assist       General transfer comment: use of pad under hips and gait belt to assist hips up, cues for hand placement with RW, posterior bias requring +2 max assist to correct in standing, stedying assist as pt pivoted to chair without ability to pick up L foot  Ambulation/Gait                 Stairs             Wheelchair Mobility    Modified Rankin (Stroke Patients Only) Modified Rankin (Stroke Patients Only) Pre-Morbid Rankin Score: Slight disability Modified Rankin: Severe disability     Balance Overall balance assessment: Needs assistance Sitting-balance support: Bilateral upper extremity supported;Feet supported Sitting balance-Leahy Scale: Fair     Standing balance support: Bilateral upper extremity supported;During functional activity Standing balance-Leahy Scale: Poor Standing balance comment: able to stand in sara stedy from flaps with min guard assist x 2                            Cognition Arousal/Alertness: Awake/alert Behavior During Therapy: WFL for tasks assessed/performed Overall Cognitive Status: Difficult to assess Area of Impairment: Following commands                       Following Commands: Follows one step commands with increased time       General Comments: Much improved compared to last session      Exercises Total Joint Exercises Ankle Circles/Pumps: AROM;Both;20 reps Quad Sets: AROM;Left;10 reps Heel Slides: AAROM;Left;5 reps;Limitations Heel Slides Limitations: Limited range into hip and knee  flexion; Taught self-AAROM using a belt to pull and  assist    General Comments        Pertinent Vitals/Pain Pain Assessment: Faces Faces Pain Scale: Hurts even more Pain Location: L thigh; briefly increases to where she moans/calls out with movement; the pain subsides quickly, and after getting  inthe chair, she reports did not have pain Pain Descriptors / Indicators: Grimacing Pain Intervention(s): Monitored during session;Repositioned;Ice applied    Home Living                      Prior Function            PT Goals (current goals can now be found in the care plan section) Acute Rehab PT Goals Patient Stated Goal: get stronger PT Goal Formulation: With patient/family Time For Goal Achievement: 05/08/20 Potential to Achieve Goals: Good Progress towards PT goals: Progressing toward goals    Frequency    Min 3X/week      PT Plan Current plan remains appropriate    Co-evaluation PT/OT/SLP Co-Evaluation/Treatment: Yes Reason for Co-Treatment: For patient/therapist safety PT goals addressed during session: Mobility/safety with mobility OT goals addressed during session: ADL's and self-care      AM-PAC PT "6 Clicks" Mobility   Outcome Measure  Help needed turning from your back to your side while in a flat bed without using bedrails?: A Lot Help needed moving from lying on your back to sitting on the side of a flat bed without using bedrails?: A Lot Help needed moving to and from a bed to a chair (including a wheelchair)?: A Lot Help needed standing up from a chair using your arms (e.g., wheelchair or bedside chair)?: A Lot Help needed to walk in hospital room?: A Lot Help needed climbing 3-5 steps with a railing? : Total 6 Click Score: 11    End of Session Equipment Utilized During Treatment: Gait belt Activity Tolerance: Patient tolerated treatment well;Other (comment) (though fatigued at end of session) Patient left: in chair;with call bell/phone within reach;with chair alarm set;with family/visitor present Nurse Communication: Mobility status PT Visit Diagnosis: Unsteadiness on feet (R26.81);Other abnormalities of gait and mobility (R26.89);Muscle weakness (generalized) (M62.81);History of falling (Z91.81)     Time: 4081-4481 PT Time  Calculation (min) (ACUTE ONLY): 45 min  Charges:  $Therapeutic Exercise: 8-22 mins $Therapeutic Activity: 8-22 mins                     Roney Marion, PT  Acute Rehabilitation Services Pager [phone] Office [place] 04/28/2020, 9:57 AM

## 2020-04-28 NOTE — Progress Notes (Signed)
STROKE TEAM PROGRESS NOTE   INTERVAL HISTORY Patient is husband is in the room.  Patient seems much better today with mental status having cleared and he feels she is back to baseline.  Vital signs are stable.  MRI scan of the brain shows tiny infarcts in the right cerebellum and parietal occipital parietal lobes and left precentral gyrus likely embolic from A. fib she was not on anticoagulation for the lip surgery.  MRAs of the neck and brain are both suboptimal due to venous contamination and patient motion but do show moderate intracranial atherosclerotic changes but no large vessel occlusion.  Echocardiogram shows ejection fraction of 70 to 75% without cardiac source of embolism.  LDL cholesterol is 38 mg percent.  Hemoglobin A1c is 5.6. Vitals:   04/28/20 0422 04/28/20 1324 04/28/20 1340 04/28/20 1350  BP: (!) 149/64 (!) 122/52 122/68 (!) 124/50  Pulse: 82 77 78 75  Resp: 17 20 18 18   Temp: 98.7 F (37.1 C) 98.9 F (37.2 C) 98.9 F (37.2 C) 99 F (37.2 C)  TempSrc: Oral Oral Oral Oral  SpO2: 94% 99% 97% 99%  Weight:      Height:       CBC:  Recent Labs  Lab 04/23/20 0500 04/24/20 0827 04/24/20 1628 04/25/20 0741 04/27/20 0808 04/28/20 0220  WBC 9.2   < > 8.6   < > 9.2 7.1  NEUTROABS 7.1  --  6.1  --   --   --   HGB 8.9*   < > 7.3*   < > 7.6* 6.8*  HCT 28.3*   < > 23.2*   < > 23.0* 21.8*  MCV 87.9   < > 87.5   < > 87.1 90.1  PLT 246   < > 188   < > 244 252   < > = values in this interval not displayed.   Basic Metabolic Panel:  Recent Labs  Lab 04/23/20 0500 04/24/20 0827 04/27/20 0808 04/28/20 0220  NA 135   < > 139 139  K 3.8   < > 3.9 3.7  CL 103   < > 109 110  CO2 21*   < > 20* 20*  GLUCOSE 130*   < > 126* 106*  BUN 26*   < > 22 25*  CREATININE 1.17*   < > 1.20* 1.26*  CALCIUM 8.7*   < > 8.3* 8.0*  MG 1.7  --   --   --    < > = values in this interval not displayed.    Lipid Panel:  Recent Labs  Lab 04/28/20 0220  CHOL 100  TRIG 129  HDL 36*   CHOLHDL 2.8  VLDL 26  LDLCALC 38    HgbA1c:  Recent Labs  Lab 04/28/20 0220  HGBA1C 5.6   Urine Drug Screen: No results for input(s): LABOPIA, COCAINSCRNUR, LABBENZ, AMPHETMU, THCU, LABBARB in the last 168 hours.  Alcohol Level No results for input(s): ETH in the last 168 hours.  IMAGING past 24 hours MR ANGIO HEAD WO CONTRAST  Result Date: 04/27/2020 CLINICAL DATA:  Stroke follow-up EXAM: MRA HEAD WITHOUT CONTRAST TECHNIQUE: Angiographic images of the Circle of Willis were obtained using MRA technique without intravenous contrast. COMPARISON:  MRI head 04/27/2020 FINDINGS: Suboptimal image quality due to patient motion. In addition, there is venous contrast present. I discussed with the technologist from the study who insisted no contrast was injected prior to the MRA of the head Left vertebral artery dominant and supplies the  basilar. Small right vertebral artery patent to the basilar. Basilar is irregular but patent. Posterior cerebral arteries are patent bilaterally with mild atherosclerotic irregularity. Internal carotid artery patent through the cavernous segment. There is considerable cavernous sinus contrast obscuring the cavernous carotid. Anterior and middle cerebral arteries patent bilaterally. Moderate stenosis in the anterior and middle cerebral arteries bilaterally. No large vessel occlusion or aneurysm. IMPRESSION: Suboptimal study due to venous contrast and patient motion Moderate intracranial atherosclerotic disease. No large vessel occlusion. Electronically Signed   By: Franchot Gallo M.D.   On: 04/27/2020 17:00   MR ANGIO NECK W WO CONTRAST  Result Date: 04/27/2020 CLINICAL DATA:  Stroke EXAM: MRA NECK WITHOUT AND WITH CONTRAST TECHNIQUE: Multiplanar and multiecho pulse sequences of the neck were obtained without and with intravenous contrast. Angiographic images of the neck were obtained using MRA technique without and with intravenous contrast. CONTRAST:  8.69mL GADAVIST  GADOBUTROL 1 MMOL/ML IV SOLN COMPARISON:  None. FINDINGS: Suboptimal study due to extensive venous contamination. During contrast infusion, the MRI equipment did not automatically begin scanning during the arterial phase. Scanning was started manually during the venous phase. In addition, there is motion on the study degrading image quality. Carotid artery is patent bilaterally. Both vertebral arteries are patent. IMPRESSION: Limited study due to venous phase scanning and patient motion. No large vessel occlusion in the neck. Electronically Signed   By: Franchot Gallo M.D.   On: 04/27/2020 17:03   PHYSICAL EXAM Pleasant elderly Caucasian lady sitting up in bed.  Not in distress. . Afebrile. Head is nontraumatic. Neck is supple without bruit.    Cardiac exam no murmur or gallop. Lungs are clear to auscultation. Distal pulses are well felt. Neurological Exam ;  Awake  Alert oriented x 3. Normal speech and language.diminished attention, registration and recall.  Eye movements full without nystagmus.fundi were not visualized. Vision acuity and fields appear normal. Hearing is normal. Palatal movements are normal. Face symmetric. Tongue midline. Normal strength, tone, reflexes and coordination. Normal sensation. Gait deferred.  ASSESSMENT/PLAN [name] is a 87 y.o. 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 She was gien coumadin reversal with vitamin K preoperatively. She was taken to the OR on 1/26 for cephalomedullary nailing of the left hip and hardware  revision of existing screws from 2019 procedure. She required 1 unit PRBC tranfusion  for postop anemia. Coumadin was held 04/22/2020 and resumed 04/25/2020 when her INR was 1.2. She has been progressively confused since surgery with delirium versus encephalopathy and her husband noted swallowing impairment 04/26/2020 for which an MRI was ordered revealing multifocal small strokes.   Small acute infarcts in the right cerebellum, bilateral occipital parietal lobes, and high left precentral gyrus. Etiology likely cardioembolic d/t pause of warfarin therapy for perioperative precautions.    MRI  Small acute infarcts in the right cerebellum, bilateral occipital, parietal lobes, and high left precentral gyrus. No acute infarct to correlate with the cranial nerve deficit. Advanced chronic ischemic injury.  MRA: Moderate intracranial atherosclerotic disease. No LVO head or neck. Limited study due to venous phase scanning and patient motion.  2D Echo EF 70-75%  LDL 38  HgbA1c 5.6  VTE prophylaxis is recommended.   Therapy recommendations:  CIR  Passed swallow eval. On regular diet with thin liquids  Disposition:  TBD  Plan: Resume warfarin as patient infarcts are small and risk of hemorrhagic transformation is very low.  No need to bridge but can use aspirin till INR is  therapeutic and then stop aspirin when INR is greater than 2  .  Hypertension  Home meds: Losartan 100mg  daily, Toprol XL 25 mg daily  Stable . Permissive hypertension (OK if < 220/120) but gradually normalize in 5-7 days . Long-term BP goal normotensive  Hyperlipidemia  Home meds:  None  LDL 38, goal < 70  Other Stroke Risk Factors  Advanced Age >/= 59   Obesity, Body mass index is 32.43 kg/m., BMI >/= 30 associated with increased stroke risk, recommend weight loss, diet and exercise as appropriate    Other Active Problems # Delirium - Low normal B12, check MMA: pending  - Empirically supplement 1000 mcg oral  daily B12, goal level > 600 - Check TSH:  TSH 0.350 - 4.500 uIU/mL 1.532    - Check UA if patient becomes symptomatic given history of frequent UTIs - try to minimize deliriogenic medications as much as possible (J Am Geriatr Soc. 2012 Apr;60(4):616-31): benzodiazepines, anticholinergics, diphenhydramine, antihistamines, narcotics, Ambien/Lunesta/Sonata etc. - environmental support for delirium: Lights on during the day, patient up and out of bed as much as is feasible, OT/PT, quiet dimly lit room at night, reorient patient often, provide hearing aides and glasses if patient uses them routinely, minimize sleep disruptions as much as possible overnight. Bundle care. As much as possible, reorient patient, and have them engage patient in activities, e.g. playing cards. TV should be off or on neutral background music unless patient engaged and watching. Try to keep interactions with the patient calm and quiet.     Hospital day # 6 I have personally obtained history,examined this patient, reviewed notes, independently viewed imaging studies, participated in medical decision making and plan of care.ROS completed by me personally and pertinent positives fully documented  I have made any additions or clarifications directly to the above note. Agree with note above.  Patient developed postop confusion and delirium which is likely multifactorial which appears to have improved.  MRI does show tiny by cerebral embolic strokes likely due to atrial fibrillation while being completely off anticoagulation for hip surgery.  Recommend resuming warfarin and may bridge with aspirin till INR is optimal then stop aspirin.  Patient unfortunately has a significant drop in hematocrit 6.8 today and may need blood transfusion.  So decision on resuming warfarin is up to primary team when they feel she is hemodynamically stable.  Long discussion with patient and husband and answered questions. Greater than 50% time during this  35-minute visit was spent in counseling and coordination of care discussion with patient and family care team. Antony Contras, MD Medical Director [place] Pager: [phone] 04/28/2020 4:59 PM   To contact Stroke Continuity provider, please refer to [email or web address] After hours, contact General Neurology

## 2020-04-28 NOTE — Progress Notes (Signed)
ANTICOAGULATION CONSULT NOTE - Initial Consult  Pharmacy Consult for Warfarin Indication: atrial fibrillation  Allergies  Allergen Reactions  . Pradaxa [Dabigatran Etexilate Mesylate] Other (See Comments)    INTERNAL BLEEDING  . Clindamycin/Lincomycin Other (See Comments)    PT STATES HER DOCTOR TOLD HER NOT TO TAKE CLINDAMYCIN BECAUSE SHE GOT C-DIFF AFTER TAKING AMPICILLIN- "tolerated in 2022" (per spouse)  . Latex Other (See Comments)    Blisters   . Ampicillin Other (See Comments)    C-DIF AFTER TAKING AMPICILLIN- "tolerated in 2022" (per husband) Has patient had a PCN reaction causing immediate rash, facial/tongue/throat swelling, SOB or lightheadedness with hypotension: No Has patient had a PCN reaction causing severe rash involving mucus membranes or skin necrosis: No Has patient had a PCN reaction that required hospitalization: No Has patient had a PCN reaction occurring within the last 10 years: No If all of the above answers are "NO", then may proceed with Cephalosporin u  . Sulfamethoxazole Diarrhea    Patient Measurements: Height: 5\' 4"  (162.6 cm) Weight: 85.7 kg (188 lb 15 oz) IBW/kg (Calculated) : 54.7  Vital Signs: Temp: 98.6 F (37 C) (01/31 1628) Temp Source: Oral (01/31 1628) BP: 113/81 (01/31 1628) Pulse Rate: 73 (01/31 1628)  Labs: Recent Labs    04/26/20 0115 04/27/20 0138 04/27/20 0808 04/28/20 0220  HGB 7.0*  --  7.6* 6.8*  HCT 22.6*  --  23.0* 21.8*  PLT 189  --  244 252  LABPROT 14.8 16.9*  --  18.2*  INR 1.2 1.4*  --  1.6*  CREATININE 1.48*  --  1.20* 1.26*    Estimated Creatinine Clearance: 35.2 mL/min (A) (by C-G formula based on SCr of 1.26 mg/dL (H)).   Medical History: Past Medical History:  Diagnosis Date  . Arthritis    "fingers, hips, feet, ankles" (11/10/2012)  . Asthma   . Atrial fibrillation (HCC)    CHRONIC COUMADIN  . Breast cancer ([place]) 1990's   "cancer on one side; precancerous tissue on the other" (11/10/2012)  .  Chronic bronchitis ([place])    "multiple times; not in a long time" (11/10/2012  . Depression   . GERD (gastroesophageal reflux disease)   . Hearing impaired   . Hypertension   . Irritable bowel   . Lymphedema    HX OF - IN LEFT ARM--NO NEEDLES OR B/P'S LEFT ARM  . Macular degeneration    "BEGINNINGS OF" MACULAR DEGENERATION  . Osteoporosis   . Pneumonia    "multiple times; not in a long time" (11/10/2012)  . Recurrent UTI   . Sleep apnea    "dx'd w/it; don't wear mask or anything" (11/10/2012)  . Stroke ([place]) ~ 2005   HX OF TIA-NO RESIDUAL PROBLEM--PARALYSIS RT SIDE FACE /PT'S MOUTH DROOPS-AND LOSS OF HEARING RT EAR --SINCE EAR SURGERY AS A CHILD   . UTI (lower urinary tract infection)    FREQUENT    Assessment: 87 year old with PMH significant for PAF on coumadin, recurrent UTI, HTN, HLD admitted to [facility] 04/22/2020 with acute closed left intratrochanteric hip fracture. Patient develops worsening dysphagia, MRI was obtain which showed acute stroke. Coumadin held for 2 days. Pharmacy consulted to restart Warfarin - per neuro no bridging except aspirin until INR of 2.  Home warfarin regimen -Take 5 mg by mouth with supper (evening meal) on Sun/Mon/Wed/Fri/Sat and 7.5 mg on Tues/Thurs. Goal of Therapy:  INR 2-3    Plan:  Warfarin 7.5 mg po x 1 today Monitor daily INR  and CBC  Alanda Slim, PharmD, [facility] Clinical Pharmacist Please see AMION for all Pharmacists' Contact Phone Numbers 04/28/2020, 5:28 PM

## 2020-04-28 NOTE — Progress Notes (Signed)
PROGRESS NOTE    [name]  [id number] DOB: December 31, 1933 DOA: 04/22/2020 PCP: Leanna Battles, MD   Brief Narrative: 87 year old with PMH significant for PAF on coumadin, recurrent UTI, HTN, HLD admitted to [facility] 04/22/2020 with acute closed left intratrochanteric hip fracture after presenting from home complaining with left hip pain.  X ray of left hip showed Mildly displaced and impacted intratrochanteric fracture of left femur.   Underwent cephalomedullary nailing of left left intertrochanteric femur fracture and removal of hardware left femur on 04/23/2020.   Patient develops worsening dysphagia, MRI was obtain which showed acute stroke.    Assessment & Plan:   Principal Problem:   Intertrochanteric fracture of left hip [facility]) Active Problems:   Paroxysmal atrial fibrillation (HCC)   Hypertension   Left hip pain   Leukocytosis   HLD (hyperlipidemia)   Confusion   Cerebral embolism with cerebral infarction  1-Closed left Hip fracture;  -S/P intramedullary nail 1/26.  -PT recommending CIR>  -CIR evaluating.  -Pain management.   2-Anemia due to acute blood loss anemia. Post sx.  Received one unit PRBC>this admission.  anemia panel consistent with iron deficiency.  Received  IV iron 1/30.  Hb decreased to 6.8. no evidence of bleeding.  Plan to transfuse one unit PRBC> [id number]  3-Acute metabolic encephalopathy;  Delirium, hospital, vs medications related.  Patient was notice to have swallow difficult during breakfast 1/29.  MRI ; obtain showed multiples small infarct  Started on B 12 supplement. MMA ordered and pending.  Mental status improved.   4-Multiples Small acute infarct;  MRI: Small acute infarcts in the right cerebellum, bilateral occipital, parietal lobes, and high left precentral gyrus. Neurologist consulted.  Plan to hold coumadin.  Started on aspirin.  Plan to resume coumadin per neurology.   Dysphagia;  Report sore throat, swallowing  difficulty 1/29.  She was choking with water. Difficult to swallow food notice 1/29 On chloraseptic spry, nystatin MRI positive for acute stroke.  Per husband patient appears much improved 1/30, swallowing better.  Started on regular diet.   5-PAF Chronically on  coumadin, INR 1.6 Pharmacy adjusting coumadin.  Plan to hold coumadin for two days due to risk for stroke transformation.  Follow stroke team recommendation today  6-CKD stage 3a;  AKI with metabolic acidosis,  Peak to 1.8.  Improving.  Cr down to 1.2.   7-Vitamin D; started Vitamin D supplement.   8-HTN;  Hold Chlorthalidone and losartan.  Continue with toprol.  Added Hydralazine prn.  Permissive HTN in setting stroke.    Leukocytosis; resolved.   HLD; Continue with lipitor.      Estimated body mass index is 32.43 kg/m as calculated from the following:   Height as of this encounter: 5\' 4"  (1.626 m).   Weight as of this encounter: 85.7 kg.   DVT prophylaxis: coumadin  Code Status: Full code Family Communication: Husband who was at bedside.  Disposition Plan:  Status is: Inpatient  Remains inpatient appropriate because:Ongoing diagnostic testing needed not appropriate for outpatient work up   Dispo: The patient is from: Home              Anticipated d/c is to: cir               Anticipated d/c date is: 2 days              Patient currently is not medically stable to d/c.   Difficult to place patient No  Consultants:   Ortho  Procedures:  Underwent cephalomedullary nailing of left left intertrochanteric femur fracture and removal of hardware left femur on 04/23/2020.     Antimicrobials:  None  Subjective: She is alert, sitting recliner. She is complaining of shoulder pain. Her mental status has improved per husband.    Objective: Vitals:   04/27/20 0941 04/27/20 1402 04/27/20 2043 04/28/20 0422  BP:  137/61 (!) 146/49 (!) 149/64  Pulse:  88 84 82  Resp:  16 18 17   Temp:  98.4 F (36.9 C) 98.4 F (36.9 C) 98.6 F (37 C) 98.7 F (37.1 C)  TempSrc: Oral Oral Oral Oral  SpO2:  95% 96% 94%  Weight:      Height:        Intake/Output Summary (Last 24 hours) at 04/28/2020 1016 Last data filed at 04/28/2020 [id number] Gross per 24 hour  Intake 240 ml  Output 450 ml  Net -210 ml   Filed Weights   04/24/20 0900 04/27/20 0500  Weight: 78.9 kg 85.7 kg    Examination:  General exam: NAD Respiratory system: CTA Cardiovascular system: S 1, S 2 RRR Gastrointestinal system:  BS present, soft, nt Central nervous system: Alert, answer question.  Extremities: left hip with incision clean,     Data Reviewed: I have personally reviewed following labs and imaging studies  CBC: Recent Labs  Lab 04/22/20 1650 04/23/20 0500 04/24/20 0827 04/24/20 1628 04/25/20 0741 04/26/20 0115 04/27/20 0808 04/28/20 0220  WBC 11.7* 9.2   < > 8.6 8.0 9.0 9.2 7.1  NEUTROABS 10.4* 7.1  --  6.1  --   --   --   --   HGB 10.1* 8.9*   < > 7.3* 7.0* 7.0* 7.6* 6.8*  HCT 30.8* 28.3*   < > 23.2* 21.4* 22.6* 23.0* 21.8*  MCV 88.5 87.9   < > 87.5 87.3 90.4 87.1 90.1  PLT 269 246   < > 188 175 189 244 252   < > = values in this interval not displayed.   Basic Metabolic Panel: Recent Labs  Lab 04/23/20 0500 04/24/20 0827 04/25/20 0741 04/26/20 0115 04/27/20 0808 04/28/20 0220  NA 135 136 138 138 139 139  K 3.8 4.2 4.3 4.7 3.9 3.7  CL 103 106 108 111 109 110  CO2 21* 21* 21* 20* 20* 20*  GLUCOSE 130* 135* 119* 136* 126* 106*  BUN 26* 29* 28* 30* 22 25*  CREATININE 1.17* 1.85* 1.75* 1.48* 1.20* 1.26*  CALCIUM 8.7* 8.1* 8.0* 7.7* 8.3* 8.0*  MG 1.7  --   --   --   --   --    GFR: Estimated Creatinine Clearance: 35.2 mL/min (A) (by C-G formula based on SCr of 1.26 mg/dL (H)). Liver Function Tests: No results for input(s): AST, ALT, ALKPHOS, BILITOT, PROT, ALBUMIN in the last 168 hours. No results for input(s): LIPASE, AMYLASE in the last 168 hours. No results for input(s):  AMMONIA in the last 168 hours. Coagulation Profile: Recent Labs  Lab 04/24/20 0827 04/25/20 0741 04/26/20 0115 04/27/20 0138 04/28/20 0220  INR 1.3* 1.2 1.2 1.4* 1.6*   Cardiac Enzymes: No results for input(s): CKTOTAL, CKMB, CKMBINDEX, TROPONINI in the last 168 hours. BNP (last 3 results) No results for input(s): PROBNP in the last 8760 hours. HbA1C: Recent Labs    04/28/20 0220  HGBA1C 5.6   CBG: Recent Labs  Lab 04/23/20 1229  GLUCAP 145*   Lipid Profile: Recent Labs    04/28/20 0220  CHOL 100  HDL 36*  LDLCALC 38  TRIG 129  CHOLHDL 2.8   Thyroid Function Tests: Recent Labs    04/27/20 1105  TSH 1.532   Anemia Panel: Recent Labs    04/27/20 0134 04/27/20 0138  VITAMINB12  --  319  FOLATE 18.1  --   FERRITIN  --  385*  TIBC  --  171*  IRON  --  17*  RETICCTPCT  --  2.2   Sepsis Labs: No results for input(s): PROCALCITON, LATICACIDVEN in the last 168 hours.  Recent Results (from the past 240 hour(s))  SARS CORONAVIRUS 2 (TAT 6-24 HRS) Nasopharyngeal Nasopharyngeal Swab     Status: None   Collection Time: 04/22/20  5:21 PM   Specimen: Nasopharyngeal Swab  Result Value Ref Range Status   SARS Coronavirus 2 NEGATIVE NEGATIVE Final    Comment: (NOTE) SARS-CoV-2 target nucleic acids are NOT DETECTED.  The SARS-CoV-2 RNA is generally detectable in upper and lower respiratory specimens during the acute phase of infection. Negative results do not preclude SARS-CoV-2 infection, do not rule out co-infections with other pathogens, and should not be used as the sole basis for treatment or other patient management decisions. Negative results must be combined with clinical observations, patient history, and epidemiological information. The expected result is Negative.  Fact Sheet for Patients: SugarRoll.be  Fact Sheet for Healthcare Providers: https://www.woods-mathews.com/  This test is not yet approved or  cleared by the [place] FDA and  has been authorized for detection and/or diagnosis of SARS-CoV-2 by FDA under an Emergency Use Authorization (EUA). This EUA will remain  in effect (meaning this test can be used) for the duration of the COVID-19 declaration under Se ction 564(b)(1) of the Act, 21 U.S.C. section 360bbb-3(b)(1), unless the authorization is terminated or revoked sooner.  Performed at [facility], [place] [address], [place], [place] [address]   Surgical pcr screen     Status: None   Collection Time: 04/23/20  1:54 PM   Specimen: Nasal Mucosa; Nasal Swab  Result Value Ref Range Status   MRSA, PCR NEGATIVE NEGATIVE Final   Staphylococcus aureus NEGATIVE NEGATIVE Final    Comment: (NOTE) The Xpert SA Assay (FDA approved for NASAL specimens in patients 69 years of age and older), is one component of a comprehensive surveillance program. It is not intended to diagnose infection nor to guide or monitor treatment. Performed at [facility], [place] [address], [place], [place] [address]          Radiology Studies: MR ANGIO HEAD WO CONTRAST  Result Date: 04/27/2020 CLINICAL DATA:  Stroke follow-up EXAM: MRA HEAD WITHOUT CONTRAST TECHNIQUE: Angiographic images of the Circle of Willis were obtained using MRA technique without intravenous contrast. COMPARISON:  MRI head 04/27/2020 FINDINGS: Suboptimal image quality due to patient motion. In addition, there is venous contrast present. I discussed with the technologist from the study who insisted no contrast was injected prior to the MRA of the head Left vertebral artery dominant and supplies the basilar. Small right vertebral artery patent to the basilar. Basilar is irregular but patent. Posterior cerebral arteries are patent bilaterally with mild atherosclerotic irregularity. Internal carotid artery patent through the cavernous segment. There is considerable cavernous sinus contrast obscuring the cavernous carotid.  Anterior and middle cerebral arteries patent bilaterally. Moderate stenosis in the anterior and middle cerebral arteries bilaterally. No large vessel occlusion or aneurysm. IMPRESSION: Suboptimal study due to venous contrast and patient motion Moderate intracranial atherosclerotic disease.  No large vessel occlusion. Electronically Signed   By: Franchot Gallo M.D.   On: 04/27/2020 17:00   MR ANGIO NECK W WO CONTRAST  Result Date: 04/27/2020 CLINICAL DATA:  Stroke EXAM: MRA NECK WITHOUT AND WITH CONTRAST TECHNIQUE: Multiplanar and multiecho pulse sequences of the neck were obtained without and with intravenous contrast. Angiographic images of the neck were obtained using MRA technique without and with intravenous contrast. CONTRAST:  8.34mL GADAVIST GADOBUTROL 1 MMOL/ML IV SOLN COMPARISON:  None. FINDINGS: Suboptimal study due to extensive venous contamination. During contrast infusion, the MRI equipment did not automatically begin scanning during the arterial phase. Scanning was started manually during the venous phase. In addition, there is motion on the study degrading image quality. Carotid artery is patent bilaterally. Both vertebral arteries are patent. IMPRESSION: Limited study due to venous phase scanning and patient motion. No large vessel occlusion in the neck. Electronically Signed   By: Franchot Gallo M.D.   On: 04/27/2020 17:03   MR BRAIN WO CONTRAST  Result Date: 04/27/2020 CLINICAL DATA:  Cranial neuropathy 9 EXAM: MRI HEAD WITHOUT CONTRAST TECHNIQUE: Multiplanar, multiecho pulse sequences of the brain and surrounding structures were obtained without intravenous contrast. COMPARISON:  01/09/2017 FINDINGS: Brain: Subcentimeter acute infarcts in the right cerebellum, bilateral occipitoparietal cortex, and high left precentral gyrus. There has been a large remote left cerebellar infarction. Small remote occipital parietal infarcts. Confluent chronic small vessel ischemia in the cerebral white  matter. Chronic small vessel ischemia to a milder degree in the brainstem where there is no visible acute infarct. Chronic ischemic injury to the deep gray nuclei accentuated by dilated perivascular spaces. Remote hypertensive pattern micro hemorrhages in the deep cerebellum and supratentorial brain. Generalized brain atrophy. No acute hemorrhage, hydrocephalus, or masslike finding. Vascular: Preserved flow voids Skull and upper cervical spine: Normal marrow signal. C4-5 facet ankylosis. Sinuses/Orbits: Bilateral cataract resection. Minor mucosal thickening in paranasal sinuses. Right mastoidectomy. IMPRESSION: 1. Small acute infarcts in the right cerebellum, bilateral occipital parietal lobes, and high left precentral gyrus. 2. No acute infarct to correlate with the cranial nerve deficit. Thin slices through the brainstem could not be obtained due to patient condition. 3. Advanced chronic ischemic injury. Electronically Signed   By: Monte Fantasia M.D.   On: 04/27/2020 06:00   DG CHEST PORT 1 VIEW  Result Date: 04/26/2020 CLINICAL DATA:  Cough. EXAM: PORTABLE CHEST 1 VIEW COMPARISON:  04/22/2020 FINDINGS: Stable enlarged cardiac silhouette and tortuous and calcified thoracic aorta. The lungs remain clear with normal vascularity. Stable mild interstitial prominence without Kerley lines. Diffuse osteopenia. IMPRESSION: No acute abnormality. Stable cardiomegaly and mild chronic interstitial lung disease. Electronically Signed   By: Claudie Revering M.D.   On: 04/26/2020 12:18   ECHOCARDIOGRAM COMPLETE  Result Date: 04/27/2020    ECHOCARDIOGRAM REPORT   Patient Name:   DAZIAH HESLER Date of Exam: 04/27/2020 Medical Rec #:  [id number]    Height:       64.0 in Accession #:    [id number]   Weight:       188.9 lb Date of Birth:  [birth year]    BSA:          1.910 m Patient Age:    70 years     BP:           170/62 mmHg Patient Gender: F            HR:           87 bpm. Exam Location:  Inpatient Procedure: 2D Echo, Color  Doppler and Cardiac Doppler Indications:    Stroke 434.91 / I163.9  History:        Patient has prior history of Echocardiogram examinations, most                 recent 08/07/2017. Stroke, Arrythmias:Atrial Fibrillation; Risk                 Factors:Hypertension and Dyslipidemia. GERD.  Sonographer:    Darlina Sicilian RDCS Referring Phys: [id number] [place]  Sonographer Comments: Exam terminated per patient's request. IMPRESSIONS  1. Left ventricular ejection fraction, by estimation, is 70 to 75%. The left ventricle has hyperdynamic function. The left ventricle has no regional wall motion abnormalities. There is mild concentric left ventricular hypertrophy. Left ventricular diastolic function could not be evaluated.  2. Right ventricular systolic function is normal. The right ventricular size is normal. There is normal pulmonary artery systolic pressure.  3. Left atrial size was moderately dilated.  4. The mitral valve is normal in structure. No evidence of mitral valve regurgitation. No evidence of mitral stenosis. Moderate mitral annular calcification.  5. The aortic valve is tricuspid. Aortic valve regurgitation is mild. Mild aortic valve sclerosis is present, with no evidence of aortic valve stenosis.  6. The inferior vena cava is normal in size with greater than 50% respiratory variability, suggesting right atrial pressure of 3 mmHg. Conclusion(s)/Recommendation(s): Incomplete exam. Further imaging declined by the patient. Unable to evaluate for atrial shunting or assess diastolic function. FINDINGS  Left Ventricle: Left ventricular ejection fraction, by estimation, is 70 to 75%. The left ventricle has hyperdynamic function. The left ventricle has no regional wall motion abnormalities. The left ventricular internal cavity size was normal in size. There is mild concentric left ventricular hypertrophy. Left ventricular diastolic function could not be evaluated. Right Ventricle: The right ventricular size is  normal. No increase in right ventricular wall thickness. Right ventricular systolic function is normal. There is normal pulmonary artery systolic pressure. The tricuspid regurgitant velocity is 2.78 m/s, and  with an assumed right atrial pressure of 3 mmHg, the estimated right ventricular systolic pressure is 62.7 mmHg. Left Atrium: Left atrial size was moderately dilated. Right Atrium: Right atrial size was normal in size. Pericardium: There is no evidence of pericardial effusion. Mitral Valve: The mitral valve is normal in structure. Moderate mitral annular calcification. No evidence of mitral valve regurgitation. No evidence of mitral valve stenosis. Tricuspid Valve: The tricuspid valve is normal in structure. Tricuspid valve regurgitation is not demonstrated. No evidence of tricuspid stenosis. Aortic Valve: The aortic valve is tricuspid. Aortic valve regurgitation is mild. Mild aortic valve sclerosis is present, with no evidence of aortic valve stenosis. Pulmonic Valve: The pulmonic valve was grossly normal. Pulmonic valve regurgitation is not visualized. No evidence of pulmonic stenosis. Aorta: The aortic root is normal in size and structure. Venous: The inferior vena cava is normal in size with greater than 50% respiratory variability, suggesting right atrial pressure of 3 mmHg. IAS/Shunts: The interatrial septum was not assessed.  LEFT VENTRICLE PLAX 2D LVIDd:         4.40 cm LVIDs:         2.90 cm LV PW:         1.20 cm LV IVS:        1.30 cm LVOT diam:     2.10 cm LVOT Area:     3.46 cm  LEFT ATRIUM  Index LA diam:      1.70 cm 0.89 cm/m LA Vol (A4C): 98.2 ml 51.42 ml/m   AORTA Ao Root diam: 3.70 cm Ao Asc diam:  3.40 cm TRICUSPID VALVE TR Peak grad:   30.9 mmHg TR Vmax:        278.00 cm/s  SHUNTS Systemic Diam: 2.10 cm Dani Gobble Croitoru MD Electronically signed by Sanda Klein MD Signature Date/Time: 04/27/2020/2:02:17 PM    Final         Scheduled Meds: . sodium chloride   Intravenous  Once  . aspirin  325 mg Oral Daily  . atorvastatin  20 mg Oral QPC supper  . cholecalciferol  1,000 Units Oral Daily  . docusate sodium  100 mg Oral BID  . metoprolol succinate  25 mg Oral Daily  . nystatin  5 mL Oral QID  . pantoprazole  40 mg Oral Daily  . vitamin B-12  1,000 mcg Oral Daily  . Vitamin D (Ergocalciferol)  50,000 Units Oral Q7 days   Continuous Infusions:    LOS: 6 days    Time spent: 345 minutes.     Elmarie Shiley, MD [facility]   If 7PM-7AM, please contact night-coverage [email or web address]  04/28/2020, 10:16 AM

## 2020-04-28 NOTE — Care Management Important Message (Signed)
Important Message  Patient Details  Name: [name] MRN: [id number] Date of Birth: [birth year]   Medicare Important Message Given:  Yes     [name] [address] 04/28/2020, 3:42 PM

## 2020-04-29 ENCOUNTER — Other Ambulatory Visit: Payer: Self-pay

## 2020-04-29 ENCOUNTER — Inpatient Hospital Stay (HOSPITAL_COMMUNITY)
Admission: RE | Admit: 2020-04-29 | Discharge: 2020-05-22 | DRG: 560 | Disposition: A | Discharge status: Home Health | Payer: Medicare Other | Source: Intra-hospital | Attending: Physical Medicine & Rehabilitation | Admitting: Physical Medicine & Rehabilitation

## 2020-04-29 DIAGNOSIS — Z9013 Acquired absence of bilateral breasts and nipples: Secondary | ICD-10-CM

## 2020-04-29 DIAGNOSIS — N183 Chronic kidney disease, stage 3 unspecified: Secondary | ICD-10-CM | POA: Diagnosis not present

## 2020-04-29 DIAGNOSIS — I10 Essential (primary) hypertension: Secondary | ICD-10-CM | POA: Diagnosis not present

## 2020-04-29 DIAGNOSIS — I129 Hypertensive chronic kidney disease with stage 1 through stage 4 chronic kidney disease, or unspecified chronic kidney disease: Secondary | ICD-10-CM | POA: Diagnosis present

## 2020-04-29 DIAGNOSIS — L89312 Pressure ulcer of right buttock, stage 2: Secondary | ICD-10-CM | POA: Diagnosis present

## 2020-04-29 DIAGNOSIS — B965 Pseudomonas (aeruginosa) (mallei) (pseudomallei) as the cause of diseases classified elsewhere: Secondary | ICD-10-CM | POA: Diagnosis present

## 2020-04-29 DIAGNOSIS — D62 Acute posthemorrhagic anemia: Secondary | ICD-10-CM | POA: Diagnosis present

## 2020-04-29 DIAGNOSIS — B961 Klebsiella pneumoniae [K. pneumoniae] as the cause of diseases classified elsewhere: Secondary | ICD-10-CM | POA: Diagnosis present

## 2020-04-29 DIAGNOSIS — G8918 Other acute postprocedural pain: Secondary | ICD-10-CM | POA: Diagnosis not present

## 2020-04-29 DIAGNOSIS — R0981 Nasal congestion: Secondary | ICD-10-CM | POA: Diagnosis not present

## 2020-04-29 DIAGNOSIS — W050XXD Fall from non-moving wheelchair, subsequent encounter: Secondary | ICD-10-CM | POA: Diagnosis present

## 2020-04-29 DIAGNOSIS — D631 Anemia in chronic kidney disease: Secondary | ICD-10-CM | POA: Diagnosis present

## 2020-04-29 DIAGNOSIS — S72142A Displaced intertrochanteric fracture of left femur, initial encounter for closed fracture: Secondary | ICD-10-CM | POA: Diagnosis present

## 2020-04-29 DIAGNOSIS — K219 Gastro-esophageal reflux disease without esophagitis: Secondary | ICD-10-CM | POA: Diagnosis not present

## 2020-04-29 DIAGNOSIS — I482 Chronic atrial fibrillation, unspecified: Secondary | ICD-10-CM | POA: Diagnosis not present

## 2020-04-29 DIAGNOSIS — S72142D Displaced intertrochanteric fracture of left femur, subsequent encounter for closed fracture with routine healing: Secondary | ICD-10-CM | POA: Diagnosis not present

## 2020-04-29 DIAGNOSIS — S72145D Nondisplaced intertrochanteric fracture of left femur, subsequent encounter for closed fracture with routine healing: Secondary | ICD-10-CM | POA: Diagnosis not present

## 2020-04-29 DIAGNOSIS — H918X3 Other specified hearing loss, bilateral: Secondary | ICD-10-CM | POA: Diagnosis present

## 2020-04-29 DIAGNOSIS — E785 Hyperlipidemia, unspecified: Secondary | ICD-10-CM | POA: Diagnosis present

## 2020-04-29 DIAGNOSIS — H9190 Unspecified hearing loss, unspecified ear: Secondary | ICD-10-CM | POA: Diagnosis present

## 2020-04-29 DIAGNOSIS — L8990 Pressure ulcer of unspecified site, unspecified stage: Secondary | ICD-10-CM | POA: Insufficient documentation

## 2020-04-29 DIAGNOSIS — S72145A Nondisplaced intertrochanteric fracture of left femur, initial encounter for closed fracture: Secondary | ICD-10-CM | POA: Diagnosis not present

## 2020-04-29 DIAGNOSIS — Z7982 Long term (current) use of aspirin: Secondary | ICD-10-CM

## 2020-04-29 DIAGNOSIS — M7522 Bicipital tendinitis, left shoulder: Secondary | ICD-10-CM | POA: Diagnosis present

## 2020-04-29 DIAGNOSIS — R0989 Other specified symptoms and signs involving the circulatory and respiratory systems: Secondary | ICD-10-CM | POA: Diagnosis not present

## 2020-04-29 DIAGNOSIS — Z91040 Latex allergy status: Secondary | ICD-10-CM

## 2020-04-29 DIAGNOSIS — F32A Depression, unspecified: Secondary | ICD-10-CM | POA: Diagnosis present

## 2020-04-29 DIAGNOSIS — G4730 Sleep apnea, unspecified: Secondary | ICD-10-CM | POA: Diagnosis present

## 2020-04-29 DIAGNOSIS — Z79890 Hormone replacement therapy (postmenopausal): Secondary | ICD-10-CM

## 2020-04-29 DIAGNOSIS — G479 Sleep disorder, unspecified: Secondary | ICD-10-CM | POA: Diagnosis not present

## 2020-04-29 DIAGNOSIS — I480 Paroxysmal atrial fibrillation: Secondary | ICD-10-CM

## 2020-04-29 DIAGNOSIS — Z882 Allergy status to sulfonamides status: Secondary | ICD-10-CM

## 2020-04-29 DIAGNOSIS — S72142S Displaced intertrochanteric fracture of left femur, sequela: Secondary | ICD-10-CM | POA: Diagnosis not present

## 2020-04-29 DIAGNOSIS — Z974 Presence of external hearing-aid: Secondary | ICD-10-CM

## 2020-04-29 DIAGNOSIS — Z79899 Other long term (current) drug therapy: Secondary | ICD-10-CM

## 2020-04-29 DIAGNOSIS — M1990 Unspecified osteoarthritis, unspecified site: Secondary | ICD-10-CM | POA: Diagnosis present

## 2020-04-29 DIAGNOSIS — H3530 Unspecified macular degeneration: Secondary | ICD-10-CM | POA: Diagnosis present

## 2020-04-29 DIAGNOSIS — R791 Abnormal coagulation profile: Secondary | ICD-10-CM | POA: Diagnosis present

## 2020-04-29 DIAGNOSIS — M810 Age-related osteoporosis without current pathological fracture: Secondary | ICD-10-CM | POA: Diagnosis present

## 2020-04-29 DIAGNOSIS — Z96652 Presence of left artificial knee joint: Secondary | ICD-10-CM | POA: Diagnosis present

## 2020-04-29 DIAGNOSIS — K529 Noninfective gastroenteritis and colitis, unspecified: Secondary | ICD-10-CM | POA: Diagnosis present

## 2020-04-29 DIAGNOSIS — I6310 Cerebral infarction due to embolism of unspecified precerebral artery: Secondary | ICD-10-CM

## 2020-04-29 DIAGNOSIS — Z1629 Resistance to other single specified antibiotic: Secondary | ICD-10-CM | POA: Diagnosis present

## 2020-04-29 DIAGNOSIS — I69392 Facial weakness following cerebral infarction: Secondary | ICD-10-CM

## 2020-04-29 DIAGNOSIS — N390 Urinary tract infection, site not specified: Secondary | ICD-10-CM

## 2020-04-29 DIAGNOSIS — Z881 Allergy status to other antibiotic agents status: Secondary | ICD-10-CM

## 2020-04-29 DIAGNOSIS — N1832 Chronic kidney disease, stage 3b: Secondary | ICD-10-CM | POA: Diagnosis not present

## 2020-04-29 DIAGNOSIS — D638 Anemia in other chronic diseases classified elsewhere: Secondary | ICD-10-CM

## 2020-04-29 DIAGNOSIS — Z7901 Long term (current) use of anticoagulants: Secondary | ICD-10-CM

## 2020-04-29 DIAGNOSIS — N1831 Chronic kidney disease, stage 3a: Secondary | ICD-10-CM | POA: Diagnosis not present

## 2020-04-29 DIAGNOSIS — Z853 Personal history of malignant neoplasm of breast: Secondary | ICD-10-CM

## 2020-04-29 DIAGNOSIS — A499 Bacterial infection, unspecified: Secondary | ICD-10-CM | POA: Diagnosis not present

## 2020-04-29 DIAGNOSIS — Z823 Family history of stroke: Secondary | ICD-10-CM

## 2020-04-29 DIAGNOSIS — Z87440 Personal history of urinary (tract) infections: Secondary | ICD-10-CM

## 2020-04-29 LAB — TYPE AND SCREEN
ABO/RH(D): A POS
Antibody Screen: NEGATIVE
Unit division: 0

## 2020-04-29 LAB — PROTIME-INR
INR: 1.6 — ABNORMAL HIGH (ref 0.8–1.2)
Prothrombin Time: 18.2 seconds — ABNORMAL HIGH (ref 11.4–15.2)

## 2020-04-29 LAB — BPAM RBC
Blood Product Expiration Date: 202202142359
ISSUE DATE / TIME: 202201311318
Unit Type and Rh: 6200

## 2020-04-29 LAB — CBC
HCT: 25.1 % — ABNORMAL LOW (ref 36.0–46.0)
Hemoglobin: 8 g/dL — ABNORMAL LOW (ref 12.0–15.0)
MCH: 28.5 pg (ref 26.0–34.0)
MCHC: 31.9 g/dL (ref 30.0–36.0)
MCV: 89.3 fL (ref 80.0–100.0)
Platelets: 269 10*3/uL (ref 150–400)
RBC: 2.81 MIL/uL — ABNORMAL LOW (ref 3.87–5.11)
RDW: 14 % (ref 11.5–15.5)
WBC: 7.3 10*3/uL (ref 4.0–10.5)
nRBC: 0 % (ref 0.0–0.2)

## 2020-04-29 MED ORDER — NYSTATIN 100000 UNIT/ML MT SUSP: 5.0000 mL | Freq: Four times a day (QID) | OROMUCOSAL | 0 refills | Status: DC

## 2020-04-29 MED ORDER — WARFARIN - PHARMACIST DOSING INPATIENT: Freq: Every day | Status: DC

## 2020-04-29 MED ORDER — METHOCARBAMOL 500 MG PO TABS
500.0000 mg | ORAL_TABLET | Freq: Four times a day (QID) | ORAL | Status: DC | PRN
  Administered 2020-05-02 – 2020-05-19 (×20): 500 mg via ORAL
  Filled 2020-04-29 (×20): qty 1

## 2020-04-29 MED ORDER — GERHARDT'S BUTT CREAM
TOPICAL_CREAM | Freq: Two times a day (BID) | CUTANEOUS | Status: DC
  Filled 2020-04-29: qty 1

## 2020-04-29 MED ORDER — PANTOPRAZOLE SODIUM 40 MG PO TBEC
40.0000 mg | DELAYED_RELEASE_TABLET | Freq: Every day | ORAL | Status: DC
  Administered 2020-04-30 – 2020-05-07 (×8): 40 mg via ORAL
  Filled 2020-04-29 (×9): qty 1

## 2020-04-29 MED ORDER — AMLODIPINE BESYLATE 2.5 MG PO TABS
2.5000 mg | ORAL_TABLET | Freq: Every day | ORAL | Status: DC
  Administered 2020-04-29: 2.5 mg via ORAL
  Filled 2020-04-29 (×2): qty 1

## 2020-04-29 MED ORDER — ATORVASTATIN CALCIUM 10 MG PO TABS
20.0000 mg | ORAL_TABLET | Freq: Every day | ORAL | Status: DC
  Administered 2020-04-29 – 2020-05-21 (×23): 20 mg via ORAL
  Filled 2020-04-29 (×25): qty 2

## 2020-04-29 MED ORDER — DICLOFENAC SODIUM 1 % EX GEL
2.0000 g | Freq: Four times a day (QID) | CUTANEOUS | Status: DC
  Administered 2020-04-29 – 2020-05-22 (×67): 2 g via TOPICAL
  Filled 2020-04-29 (×2): qty 100

## 2020-04-29 MED ORDER — CYANOCOBALAMIN 1000 MCG PO TABS: 1000.0000 ug | ORAL_TABLET | Freq: Every day | ORAL | 0 refills | Status: DC

## 2020-04-29 MED ORDER — VITAMIN D (ERGOCALCIFEROL) 1.25 MG (50000 UNIT) PO CAPS
50000.0000 [IU] | ORAL_CAPSULE | ORAL | Status: DC
  Administered 2020-05-03 – 2020-05-17 (×3): 50000 [IU] via ORAL
  Filled 2020-04-29 (×3): qty 1

## 2020-04-29 MED ORDER — WARFARIN SODIUM 5 MG PO TABS
5.0000 mg | ORAL_TABLET | Freq: Once | ORAL | Status: AC
  Administered 2020-04-29: 5 mg via ORAL
  Filled 2020-04-29: qty 1

## 2020-04-29 MED ORDER — VITAMIN B-12 1000 MCG PO TABS
1000.0000 ug | ORAL_TABLET | Freq: Every day | ORAL | Status: DC
  Administered 2020-04-30 – 2020-05-22 (×23): 1000 ug via ORAL
  Filled 2020-04-29 (×24): qty 1

## 2020-04-29 MED ORDER — AMLODIPINE BESYLATE 2.5 MG PO TABS: 2.5000 mg | ORAL_TABLET | Freq: Every day | ORAL | 0 refills | Status: DC

## 2020-04-29 MED ORDER — NYSTATIN 100000 UNIT/ML MT SUSP
5.0000 mL | Freq: Four times a day (QID) | OROMUCOSAL | Status: DC
  Administered 2020-04-29 – 2020-05-08 (×33): 500000 [IU] via ORAL
  Filled 2020-04-29 (×34): qty 5

## 2020-04-29 MED ORDER — DOCUSATE SODIUM 100 MG PO CAPS: 100.0000 mg | ORAL_CAPSULE | Freq: Two times a day (BID) | ORAL | 0 refills | Status: DC

## 2020-04-29 MED ORDER — ASPIRIN 325 MG PO TABS
325.0000 mg | ORAL_TABLET | Freq: Every day | ORAL | Status: DC
  Administered 2020-04-30 – 2020-05-01 (×2): 325 mg via ORAL
  Filled 2020-04-29 (×4): qty 1

## 2020-04-29 MED ORDER — ACETAMINOPHEN 325 MG PO TABS
650.0000 mg | ORAL_TABLET | Freq: Four times a day (QID) | ORAL | Status: DC | PRN
  Administered 2020-04-30 – 2020-05-19 (×12): 650 mg via ORAL
  Filled 2020-04-29 (×12): qty 2

## 2020-04-29 MED ORDER — OXYCODONE-ACETAMINOPHEN 5-325 MG PO TABS
1.0000 | ORAL_TABLET | ORAL | Status: DC | PRN
  Administered 2020-05-01 – 2020-05-10 (×28): 1 via ORAL
  Filled 2020-04-29 (×30): qty 1

## 2020-04-29 MED ORDER — ASPIRIN 325 MG PO TABS: 325.0000 mg | ORAL_TABLET | Freq: Every day | ORAL | 0 refills | Status: DC

## 2020-04-29 MED ORDER — VITAMIN D 25 MCG (1000 UNIT) PO TABS
1000.0000 [IU] | ORAL_TABLET | Freq: Every day | ORAL | Status: DC
  Administered 2020-04-30 – 2020-05-22 (×23): 1000 [IU] via ORAL
  Filled 2020-04-29 (×24): qty 1

## 2020-04-29 MED ORDER — WARFARIN SODIUM 5 MG PO TABS: 5.0000 mg | ORAL_TABLET | Freq: Once | ORAL | Status: DC

## 2020-04-29 MED ORDER — METOPROLOL SUCCINATE ER 25 MG PO TB24
25.0000 mg | ORAL_TABLET | Freq: Every day | ORAL | Status: DC
  Administered 2020-04-30 – 2020-05-11 (×12): 25 mg via ORAL
  Filled 2020-04-29 (×13): qty 1

## 2020-04-29 MED ORDER — ACETAMINOPHEN 650 MG RE SUPP: 650.0000 mg | Freq: Four times a day (QID) | RECTAL | Status: DC | PRN

## 2020-04-29 MED ORDER — POLYETHYLENE GLYCOL 3350 17 G PO PACK
17.0000 g | PACK | Freq: Every day | ORAL | Status: DC | PRN
  Administered 2020-05-11: 17 g via ORAL
  Filled 2020-04-29: qty 1

## 2020-04-29 MED ORDER — VITAMIN D3 25 MCG PO TABS: 1000.0000 [IU] | ORAL_TABLET | Freq: Every day | ORAL | 0 refills | Status: DC

## 2020-04-29 MED ORDER — DOCUSATE SODIUM 100 MG PO CAPS
100.0000 mg | ORAL_CAPSULE | Freq: Two times a day (BID) | ORAL | Status: DC
  Administered 2020-04-29 – 2020-05-22 (×45): 100 mg via ORAL
  Filled 2020-04-29 (×46): qty 1

## 2020-04-29 NOTE — Progress Notes (Signed)
Physical Medicine and Rehabilitation Consult Reason for Consult: Altered mental status with decreased functional mobility Referring Physician: [facility]     HPI: [name] is a 87 y.o. right-handed female with history of atrial fibrillation on chronic Coumadin, CKD stage III with baseline 1.46, breast cancer with bilateral mastectomy, TIA without residual weakness, recurrent UTI, bilateral TKR as well as left femur fracture May 2019 with ORIF.  Per chart review lives with spouse 1 level home ramped entrance.  Ambulates with a rolling walker.  Requires some assistance for lower body ADLs.  Presented 04/22/2020 after a fall without loss of consciousness landing on her left hip.  She stated she leaned too far forward falling out of her wheelchair.  Admission chemistries hemoglobin 10.1 BUN 30 creatinine 1.46 urinalysis negative nitrite.  X-rays imaging revealed left intertrochanteric femur fracture as well as healed left distal femur fracture.  Underwent nailing of left intertrochanteric femur fracture removal of hardware left femur 04/23/2020 per Dr. Doreatha Martin.  Weightbearing as tolerated left lower extremity.  Postoperative chronic Coumadin resumed.  Acute blood loss anemia 7.0 and monitored.  Creatinine stable 1.75.  On 04/27/2020 patient with increased confusion difficulty in swallowing.  MRI of the brain completed showing small acute infarcts in the right cerebellum bilateral occipital parietal lobes and high left precentral gyrus.  Patient did not receive TPA.  MRA of head and neck no large vessel occlusion.  Echocardiogram with ejection fraction of 70 to 75% no wall motion abnormalities.  Patient's chronic Coumadin currently on hold maintain on aspirin 325 mg daily.  Tolerating a regular consistency diet.   Physical Medicine & Rehabilitation was consulted to assess candidacy for CIR given current impaired mobility and ADLs.     Review of Systems  HENT: Positive for hearing loss.    Cardiovascular: Positive for palpitations.  Gastrointestinal: Positive for diarrhea.       GERD  Genitourinary: Negative for dysuria, flank pain and hematuria.  Musculoskeletal: Positive for joint pain.  Psychiatric/Behavioral: Positive for depression. The patient has insomnia.         Past Medical History:  Diagnosis Date  . Arthritis      "fingers, hips, feet, ankles" (11/10/2012)  . Asthma    . Atrial fibrillation (HCC)      CHRONIC COUMADIN  . Breast cancer ([place]) 1990's    "cancer on one side; precancerous tissue on the other" (11/10/2012)  . Chronic bronchitis ([place])      "multiple times; not in a long time" (11/10/2012  . Depression    . GERD (gastroesophageal reflux disease)    . Hearing impaired    . Hypertension    . Irritable bowel    . Lymphedema      HX OF - IN LEFT ARM--NO NEEDLES OR B/P'S LEFT ARM  . Macular degeneration      "BEGINNINGS OF" MACULAR DEGENERATION  . Osteoporosis    . Pneumonia      "multiple times; not in a long time" (11/10/2012)  . Recurrent UTI    . Sleep apnea      "dx'd w/it; don't wear mask or anything" (11/10/2012)  . Stroke ([place]) ~ 2005    HX OF TIA-NO RESIDUAL PROBLEM--PARALYSIS RT SIDE FACE /PT'S MOUTH DROOPS-AND LOSS OF HEARING RT EAR --SINCE EAR SURGERY AS A CHILD   . UTI (lower urinary tract infection)      FREQUENT    Past Surgical History:  Procedure Laterality Date  . BREAST  BIOPSY Bilateral 1990's  . Dacryocystorhinostomy   02/18/2016  . INNER EAR SURGERY Right      MULTIPLE EAR SURGERIES,  . INTRAMEDULLARY (IM) NAIL INTERTROCHANTERIC Left 04/23/2020    Procedure: INTRAMEDULLARY (IM) NAIL INTERTROCHANTRIC;  Surgeon: Shona Needles, MD;  Location: [facility];  Service: Orthopedics;  Laterality: Left;  . JOINT REPLACEMENT      . KNEE ARTHROSCOPY   04/07/2012    Procedure: ARTHROSCOPY KNEE;  Surgeon: Gearlean Alf, MD;  Location: [facility];  Service: Orthopedics;  Laterality: Left;  WITH SYNOVECTOMY  . MASTECTOMY  Bilateral 1990's  . MASTOIDECTOMY Right 1942  . ORIF FEMUR FRACTURE Left 08/08/2017    Procedure: OPEN REDUCTION INTERNAL FIXATION (ORIF) DISTAL FEMUR FRACTURE;  Surgeon: Shona Needles, MD;  Location: [facility];  Service: Orthopedics;  Laterality: Left;  . TONSILLECTOMY      . TOTAL KNEE ARTHROPLASTY   09/20/2011    LEFT TOTAL KNEE ARTHROPLASTY;  Surgeon: Gearlean Alf, MD;  Location: [facility];  Service: Orthopedics;  Laterality: Left;  . TOTAL KNEE ARTHROPLASTY   2006         Family History  Problem Relation Age of Onset  . CAD Mother    . Stroke Mother    . CAD Father      Social History:  reports that she has never smoked. She has never used smokeless tobacco. She reports that she does not drink alcohol and does not use drugs. Allergies:  Allergies  Allergen Reactions  . Pradaxa [Dabigatran Etexilate Mesylate] Other (See Comments)      INTERNAL BLEEDING  . Clindamycin/Lincomycin Other (See Comments)      PT STATES HER DOCTOR TOLD HER NOT TO TAKE CLINDAMYCIN BECAUSE SHE GOT C-DIFF AFTER TAKING AMPICILLIN- "tolerated in 2022" (per spouse)  . Latex Other (See Comments)      Blisters    . Ampicillin Other (See Comments)      C-DIF AFTER TAKING AMPICILLIN- "tolerated in 2022" (per husband) Has patient had a PCN reaction causing immediate rash, facial/tongue/throat swelling, SOB or lightheadedness with hypotension: No Has patient had a PCN reaction causing severe rash involving mucus membranes or skin necrosis: No Has patient had a PCN reaction that required hospitalization: No Has patient had a PCN reaction occurring within the last 10 years: No If all of the above answers are "NO", then may proceed with Cephalosporin u  . Sulfamethoxazole Diarrhea          Medications Prior to Admission  Medication Sig Dispense Refill  . atorvastatin (LIPITOR) 20 MG tablet Take 20 mg by mouth daily after supper.       . chlorthalidone (HYGROTON) 50 MG tablet Take 50 mg by mouth daily.       Marland Kitchen  estradiol (ESTRACE) 0.1 MG/GM vaginal cream Place 1 Applicatorful vaginally 2 (two) times a week.      . loperamide (IMODIUM) 2 MG capsule Take 2-4 mg by mouth 5 (five) times daily as needed for diarrhea or loose stools.       Marland Kitchen losartan (COZAAR) 100 MG tablet Take 100 mg by mouth daily.      . metoprolol succinate (TOPROL-XL) 25 MG 24 hr tablet Take 1 tablet (25 mg total) by mouth daily. 30 tablet 11  . Multiple Vitamins-Minerals (ONE-A-DAY PROACTIVE 65+) TABS Take 1 tablet by mouth daily with breakfast.      . Multiple Vitamins-Minerals (PRESERVISION AREDS) CAPS Take 1 capsule by mouth 2 (two) times daily.      Marland Kitchen  Polyethyl Glyc-Propyl Glyc PF (SYSTANE ULTRA PF) 0.4-0.3 % SOLN Place 1 drop into both eyes at bedtime.      . Probiotic Product (VSL#3 PO) Take 1 capsule by mouth in the morning.      . saccharomyces boulardii (FLORASTOR) 250 MG capsule Take 500 mg by mouth every evening.      . warfarin (COUMADIN) 5 MG tablet Take 5-7.5 mg by mouth See admin instructions. Take 5 mg by mouth with supper (evening meal) on Sun/Mon/Wed/Fri/Sat and 7.5 mg on Tues/Thurs          Home: Home Living Family/patient expects to be discharged to:: Private residence Living Arrangements: Spouse/significant other Available Help at Discharge: Family,Available 24 hours/day Type of Home: House Home Access: [place]: Two level,Able to live on main level with bedroom/bathroom Alternate Level Stairs-Number of Steps: full flight Alternate Level Stairs-Rails: Right Bathroom Shower/Tub: Multimedia programmer: Handicapped height Bathroom Accessibility: Yes Home Equipment: Environmental consultant - 2 wheels,Bedside commode,Shower seat,Toilet riser,Hand held shower head,Wheelchair - Education administrator (comment) (lift chair)  Lives With: Spouse  Functional History: Prior Function Level of Independence: Needs assistance Gait / Transfers Assistance Needed: ambulates with RW at baseline ADL's / Homemaking Assistance  Needed: requires assistance for LB dressing and IADL Functional Status:  Mobility: Bed Mobility Overal bed mobility: Needs Assistance Bed Mobility: Rolling,Supine to Sit,Sit to Supine Rolling: Total assist,+2 for physical assistance,+2 for safety/equipment Supine to sit: Total assist,+2 for physical assistance,+2 for safety/equipment Sit to supine: Total assist,+2 for physical assistance,+2 for safety/equipment General bed mobility comments: assist for all aspects Transfers Overall transfer level: Needs assistance Equipment used: Rolling walker (2 wheeled) Transfers: Sit to/from Stand Sit to Stand: Max assist,+2 physical assistance,+2 safety/equipment,From elevated surface General transfer comment: x 2 from elevated bed with use of bed pad under hips   ADL: ADL General ADL Comments: total assist due to lethargy, incontinent of bowel   Cognition: Cognition Overall Cognitive Status: Difficult to assess Orientation Level: Oriented X4 Cognition Arousal/Alertness: Lethargic Behavior During Therapy: Flat affect Overall Cognitive Status: Difficult to assess Area of Impairment: Attention,Following commands Current Attention Level: Selective Following Commands: Follows one step commands with increased time,Follows multi-step commands inconsistently General Comments: difficult to assess due to lethargy and HOH. Husband reports she is talkative at baseline, however this session patient said little to no words   Blood pressure (!) 149/64, pulse 82, temperature 98.7 F (37.1 C), temperature source Oral, resp. rate 17, height 5\' 4"  (1.626 m), weight 85.7 kg, SpO2 94 %. Physical Exam   General: Alert, No apparent distress HEENT: Head is normocephalic, extremely hard of hearing in right ear Neck: Supple without JVD or lymphadenopathy Heart: Reg rate and rhythm. No murmurs rubs or gallops Chest: CTA bilaterally without wheezes, rales, or rhonchi; no distress Abdomen: Soft, non-tender,  non-distended, bowel sounds positive. Extremities: No clubbing, cyanosis, or edema. Pulses are 2+ Psych: Pt's affect is appropriate. Pt is cooperative Skin: Left hip Incision C/D/I Neurological:     Comments: Patient is a bit lethargic but arousable.  Makes eye contact with examiner.  She is dysarthric but intelligible.  Provides her name and age. BUE 4/5. 1/5 LLE and 2/5 RLE (both pain limited)    Lab Results Last 24 Hours       Results for orders placed or performed during the hospital encounter of 04/22/20 (from the past 24 hour(s))  CBC     Status: Abnormal    Collection Time: 04/27/20  8:08 AM  Result Value Ref  Range    WBC 9.2 4.0 - 10.5 K/uL    RBC 2.64 (L) 3.87 - 5.11 MIL/uL    Hemoglobin 7.6 (L) 12.0 - 15.0 g/dL    HCT 23.0 (L) 36.0 - 46.0 %    MCV 87.1 80.0 - 100.0 fL    MCH 28.8 26.0 - 34.0 pg    MCHC 33.0 30.0 - 36.0 g/dL    RDW 14.3 11.5 - 15.5 %    Platelets 244 150 - 400 K/uL    nRBC 0.0 0.0 - 0.2 %  Basic metabolic panel     Status: Abnormal    Collection Time: 04/27/20  8:08 AM  Result Value Ref Range    Sodium 139 135 - 145 mmol/L    Potassium 3.9 3.5 - 5.1 mmol/L    Chloride 109 98 - 111 mmol/L    CO2 20 (L) 22 - 32 mmol/L    Glucose, Bld 126 (H) 70 - 99 mg/dL    BUN 22 8 - 23 mg/dL    Creatinine, Ser 1.20 (H) 0.44 - 1.00 mg/dL    Calcium 8.3 (L) 8.9 - 10.3 mg/dL    GFR, Estimated 45 (L) >60 mL/min    Anion gap 10 5 - 15  TSH     Status: None    Collection Time: 04/27/20 11:05 AM  Result Value Ref Range    TSH 1.532 0.350 - 4.500 uIU/mL  Protime-INR     Status: Abnormal    Collection Time: 04/28/20  2:20 AM  Result Value Ref Range    Prothrombin Time 18.2 (H) 11.4 - 15.2 seconds    INR 1.6 (H) 0.8 - 1.2  Hemoglobin A1c     Status: None    Collection Time: 04/28/20  2:20 AM  Result Value Ref Range    Hgb A1c MFr Bld 5.6 4.8 - 5.6 %    Mean Plasma Glucose 114.02 mg/dL  Lipid panel     Status: Abnormal    Collection Time: 04/28/20  2:20 AM  Result  Value Ref Range    Cholesterol 100 0 - 200 mg/dL    Triglycerides 129 <150 mg/dL    HDL 36 (L) >40 mg/dL    Total CHOL/HDL Ratio 2.8 RATIO    VLDL 26 0 - 40 mg/dL    LDL Cholesterol 38 0 - 99 mg/dL       Imaging Results (Last 48 hours)  MR ANGIO HEAD WO CONTRAST   Result Date: 04/27/2020 CLINICAL DATA:  Stroke follow-up EXAM: MRA HEAD WITHOUT CONTRAST TECHNIQUE: Angiographic images of the Circle of Willis were obtained using MRA technique without intravenous contrast. COMPARISON:  MRI head 04/27/2020 FINDINGS: Suboptimal image quality due to patient motion. In addition, there is venous contrast present. I discussed with the technologist from the study who insisted no contrast was injected prior to the MRA of the head Left vertebral artery dominant and supplies the basilar. Small right vertebral artery patent to the basilar. Basilar is irregular but patent. Posterior cerebral arteries are patent bilaterally with mild atherosclerotic irregularity. Internal carotid artery patent through the cavernous segment. There is considerable cavernous sinus contrast obscuring the cavernous carotid. Anterior and middle cerebral arteries patent bilaterally. Moderate stenosis in the anterior and middle cerebral arteries bilaterally. No large vessel occlusion or aneurysm. IMPRESSION: Suboptimal study due to venous contrast and patient motion Moderate intracranial atherosclerotic disease. No large vessel occlusion. Electronically Signed   By: Franchot Gallo M.D.   On: 04/27/2020 17:00  MR ANGIO NECK W WO CONTRAST   Result Date: 04/27/2020 CLINICAL DATA:  Stroke EXAM: MRA NECK WITHOUT AND WITH CONTRAST TECHNIQUE: Multiplanar and multiecho pulse sequences of the neck were obtained without and with intravenous contrast. Angiographic images of the neck were obtained using MRA technique without and with intravenous contrast. CONTRAST:  8.315mL GADAVIST GADOBUTROL 1 MMOL/ML IV SOLN COMPARISON:  None. FINDINGS: Suboptimal  study due to extensive venous contamination. During contrast infusion, the MRI equipment did not automatically begin scanning during the arterial phase. Scanning was started manually during the venous phase. In addition, there is motion on the study degrading image quality. Carotid artery is patent bilaterally. Both vertebral arteries are patent. IMPRESSION: Limited study due to venous phase scanning and patient motion. No large vessel occlusion in the neck. Electronically Signed   By: Marlan Palauharles  Clark M.D.   On: 04/27/2020 17:03    MR BRAIN WO CONTRAST   Result Date: 04/27/2020 CLINICAL DATA:  Cranial neuropathy 9 EXAM: MRI HEAD WITHOUT CONTRAST TECHNIQUE: Multiplanar, multiecho pulse sequences of the brain and surrounding structures were obtained without intravenous contrast. COMPARISON:  01/09/2017 FINDINGS: Brain: Subcentimeter acute infarcts in the right cerebellum, bilateral occipitoparietal cortex, and high left precentral gyrus. There has been a large remote left cerebellar infarction. Small remote occipital parietal infarcts. Confluent chronic small vessel ischemia in the cerebral white matter. Chronic small vessel ischemia to a milder degree in the brainstem where there is no visible acute infarct. Chronic ischemic injury to the deep gray nuclei accentuated by dilated perivascular spaces. Remote hypertensive pattern micro hemorrhages in the deep cerebellum and supratentorial brain. Generalized brain atrophy. No acute hemorrhage, hydrocephalus, or masslike finding. Vascular: Preserved flow voids Skull and upper cervical spine: Normal marrow signal. C4-5 facet ankylosis. Sinuses/Orbits: Bilateral cataract resection. Minor mucosal thickening in paranasal sinuses. Right mastoidectomy. IMPRESSION: 1. Small acute infarcts in the right cerebellum, bilateral occipital parietal lobes, and high left precentral gyrus. 2. No acute infarct to correlate with the cranial nerve deficit. Thin slices through the brainstem  could not be obtained due to patient condition. 3. Advanced chronic ischemic injury. Electronically Signed   By: Marnee SpringJonathon  Watts M.D.   On: 04/27/2020 06:00    DG CHEST PORT 1 VIEW   Result Date: 04/26/2020 CLINICAL DATA:  Cough. EXAM: PORTABLE CHEST 1 VIEW COMPARISON:  04/22/2020 FINDINGS: Stable enlarged cardiac silhouette and tortuous and calcified thoracic aorta. The lungs remain clear with normal vascularity. Stable mild interstitial prominence without Kerley lines. Diffuse osteopenia. IMPRESSION: No acute abnormality. Stable cardiomegaly and mild chronic interstitial lung disease. Electronically Signed   By: Beckie SaltsSteven  Reid M.D.   On: 04/26/2020 12:18    ECHOCARDIOGRAM COMPLETE   Result Date: 04/27/2020    ECHOCARDIOGRAM REPORT   Patient Name:   Janeann ForehandCAROL Pennel Date of Exam: 04/27/2020 Medical Rec #:  [id number]    Height:       64.0 in Accession #:    [id number]   Weight:       188.9 lb Date of Birth:  1933/12/19    BSA:          1.910 m Patient Age:    86 years     BP:           170/62 mmHg Patient Gender: F            HR:           87 bpm. Exam Location:  Inpatient Procedure: 2D Echo, Color Doppler and Cardiac Doppler Indications:  Stroke 434.91 / I163.9  History:        Patient has prior history of Echocardiogram examinations, most                 recent 08/07/2017. Stroke, Arrythmias:Atrial Fibrillation; Risk                 Factors:Hypertension and Dyslipidemia. GERD.  Sonographer:    Darlina Sicilian RDCS Referring Phys: [id number] [place]  Sonographer Comments: Exam terminated per patient's request. IMPRESSIONS  1. Left ventricular ejection fraction, by estimation, is 70 to 75%. The left ventricle has hyperdynamic function. The left ventricle has no regional wall motion abnormalities. There is mild concentric left ventricular hypertrophy. Left ventricular diastolic function could not be evaluated.  2. Right ventricular systolic function is normal. The right ventricular size is normal. There is  normal pulmonary artery systolic pressure.  3. Left atrial size was moderately dilated.  4. The mitral valve is normal in structure. No evidence of mitral valve regurgitation. No evidence of mitral stenosis. Moderate mitral annular calcification.  5. The aortic valve is tricuspid. Aortic valve regurgitation is mild. Mild aortic valve sclerosis is present, with no evidence of aortic valve stenosis.  6. The inferior vena cava is normal in size with greater than 50% respiratory variability, suggesting right atrial pressure of 3 mmHg. Conclusion(s)/Recommendation(s): Incomplete exam. Further imaging declined by the patient. Unable to evaluate for atrial shunting or assess diastolic function. FINDINGS  Left Ventricle: Left ventricular ejection fraction, by estimation, is 70 to 75%. The left ventricle has hyperdynamic function. The left ventricle has no regional wall motion abnormalities. The left ventricular internal cavity size was normal in size. There is mild concentric left ventricular hypertrophy. Left ventricular diastolic function could not be evaluated. Right Ventricle: The right ventricular size is normal. No increase in right ventricular wall thickness. Right ventricular systolic function is normal. There is normal pulmonary artery systolic pressure. The tricuspid regurgitant velocity is 2.78 m/s, and  with an assumed right atrial pressure of 3 mmHg, the estimated right ventricular systolic pressure is [id number] mmHg. Left Atrium: Left atrial size was moderately dilated. Right Atrium: Right atrial size was normal in size. Pericardium: There is no evidence of pericardial effusion. Mitral Valve: The mitral valve is normal in structure. Moderate mitral annular calcification. No evidence of mitral valve regurgitation. No evidence of mitral valve stenosis. Tricuspid Valve: The tricuspid valve is normal in structure. Tricuspid valve regurgitation is not demonstrated. No evidence of tricuspid stenosis. Aortic Valve: The  aortic valve is tricuspid. Aortic valve regurgitation is mild. Mild aortic valve sclerosis is present, with no evidence of aortic valve stenosis. Pulmonic Valve: The pulmonic valve was grossly normal. Pulmonic valve regurgitation is not visualized. No evidence of pulmonic stenosis. Aorta: The aortic root is normal in size and structure. Venous: The inferior vena cava is normal in size with greater than 50% respiratory variability, suggesting right atrial pressure of 3 mmHg. IAS/Shunts: The interatrial septum was not assessed.  LEFT VENTRICLE PLAX 2D LVIDd:         4.40 cm LVIDs:         2.90 cm LV PW:         1.20 cm LV IVS:        1.30 cm LVOT diam:     2.10 cm LVOT Area:     3.46 cm  LEFT ATRIUM           Index LA diam:      1.70  cm 0.89 cm/m LA Vol (A4C): 98.2 ml 51.42 ml/m   AORTA Ao Root diam: 3.70 cm Ao Asc diam:  3.40 cm TRICUSPID VALVE TR Peak grad:   30.9 mmHg TR Vmax:        278.00 cm/s  SHUNTS Systemic Diam: 2.10 cm Dani Gobble Croitoru MD Electronically signed by Sanda Klein MD Signature Date/Time: 04/27/2020/2:02:17 PM    Final          Assessment/Plan: Diagnosis: Multifocal small strokes, likely cardioembolic 1. Does the need for close, 24 hr/day medical supervision in concert with the patient's rehab needs make it unreasonable for this patient to be served in a less intensive setting? Yes 2. Co-Morbidities requiring supervision/potential complications:  1. Delirium: currently undergoing workup, appreciate neurology recommendations 2. Left intertrochanteric hip fracture: WBAT, fall precautions 3. Paroxysmal afib- warfarin restarted, pharmacy note reviewed 4. Obesity (BMI 32.43) 5. Extremely hard of hearing: needs right hearing aide.  3. Due to bladder management, bowel management, safety, skin/wound care, disease management, medication administration, pain management and patient education, does the patient require 24 hr/day rehab nursing? Yes 4. Does the patient require coordinated care of  a physician, rehab nurse, therapy disciplines of PT, OT to address physical and functional deficits in the context of the above medical diagnosis(es)? Yes Addressing deficits in the following areas: balance, endurance, locomotion, strength, transferring, bowel/bladder control, bathing, dressing, feeding, grooming, toileting, cognition and psychosocial support 5. Can the patient actively participate in an intensive therapy program of at least 3 hrs of therapy per day at least 5 days per week? Yes 6. The potential for patient to make measurable gains while on inpatient rehab is good 7. Anticipated functional outcomes upon discharge from inpatient rehab are min assist  with PT, min assist with OT 8. Estimated rehab length of stay to reach the above functional goals is: 18-20 days 9. Anticipated discharge destination: Home 10. Overall Rehab/Functional Prognosis: excellent   RECOMMENDATIONS: This patient's condition is appropriate for continued rehabilitative care in the following setting: CIR Patient has agreed to participate in recommended program. Yes Note that insurance prior authorization may be required for reimbursement for recommended care.   Comment: Thank you for this consult. Admission coordinator to follow.    I have personally performed a face to face diagnostic evaluation, including, but not limited to relevant history and physical exam findings, of this patient and developed relevant assessment and plan.  Additionally, I have reviewed and concur with the physician assistant's documentation above.   Leeroy Cha, MD   Lavon Paganini [place], PA-C 04/28/2020

## 2020-04-29 NOTE — Progress Notes (Signed)
Inpatient Rehabilitation Medication Review by a Pharmacist  A complete drug regimen review was completed for this patient to identify any potential clinically significant medication issues.  Clinically significant medication issues were identified:  Yes.   Type of Medication Issue Identified Description of Issue Urgent (address now) Non-Urgent (address on AM team rounds) Plan Plan Accepted by Provider? (Yes / No / Pending AM Rounds)  Drug Interaction(s) (clinically significant)       Duplicate Therapy       Allergy       No Medication Administration End Date       Incorrect Dose       Additional Drug Therapy Needed  Amlodipine 2.5 mg daily  Non-urgent (address on  AM rounds) Pharmacist will check with dr to see if  this medication is to continue. Pending AM rounds  Other  These additional medications were listed on the pt's med list but pt did not receive while an inpt.  Please evaluate whether they should be started prior to discharge: Preservision 1 tab daily. Multivitamin with minerals (ONE-A-DAY PROACTIVE 65+) 1 tab daily. Estrace vaginal cream one applicatorful 2x/wk.       For non-urgent medication issues to be resolved on team rounds tomorrow morning a [facility] Secure Chat Handoff was sent to: AM clinical pharmacist Janett Billow Carney).    Time spent performing this drug regimen review (minutes):  12 min.   Blenda Nicely 04/29/2020 9:44 PM

## 2020-04-29 NOTE — Progress Notes (Signed)
Physical Therapy Treatment Patient Details Name: [name] MRN: [id number] DOB: 10-16-1933 Today's Date: 04/29/2020    History of Present Illness 87 y.o. female past medical history of A. fib on Coumadin, hypertension, osteoporosis, presenting to the emergency department from home with left hip injury. Patient sustained L intratrochanteric hip fx. Patient s/p IM nail L femur on 1/26. her husband noted swallowing impairment 04/26/2020 for which an MRI was ordered revealing small acute infarcts in the right cerebellum, bilateral occipital parietal lobes, and high left precentral gyrus.    PT Comments    Continuing work on functional mobility and activity tolerance;  Session focused on functional transfers, able to stand to RW with bilateral assist; still painful and difficulty taking steps, but overall good participation continues; Continue to recommend comprehensive inpatient rehab (CIR) for post-acute therapy needs.    Follow Up Recommendations  CIR;Supervision/Assistance - 24 hour     Equipment Recommendations  Rolling walker with 5" wheels;3in1 (PT)    Recommendations for Other Services       Precautions / Restrictions Precautions Precautions: Fall Restrictions LLE Weight Bearing: Weight bearing as tolerated    Mobility  Bed Mobility Overal bed mobility: Needs Assistance Bed Mobility: Rolling;Sidelying to Sit Rolling: Max assist Sidelying to sit: Max assist;+2 for safety/equipment       General bed mobility comments: Noatble increased initiation and paticipation in mobility tasks, assist for LEs over EOB and to raise trunk, assist for hips to EOB using bed pad  Transfers Overall transfer level: Needs assistance Equipment used: Rolling walker (2 wheeled);Ambulation equipment used Transfers: Sit to/from [facility] Sit to Stand: +2 physical assistance;Max assist Stand pivot transfers: +2 physical assistance;Max assist       General transfer comment:  use of pad under hips and gait belt to assist hips up, cues for hand placement with RW, posterior bias requring +2 max assist to correct in standing, stedying assist as pt pivoted to chair without ability to pick up L foot  Ambulation/Gait                 Stairs             Wheelchair Mobility    Modified Rankin (Stroke Patients Only) Modified Rankin (Stroke Patients Only) Pre-Morbid Rankin Score: Slight disability Modified Rankin: Severe disability     Balance Overall balance assessment: Needs assistance Sitting-balance support: Bilateral upper extremity supported;Feet supported Sitting balance-Leahy Scale: Fair Sitting balance - Comments: initially requiring minA-min guard to maintain sitting balance, progressing to close supervision     Standing balance-Leahy Scale: Poor Standing balance comment: able to stand in sara stedy from flaps with min guard assist x 2                            Cognition Arousal/Alertness: Awake/alert Behavior During Therapy: WFL for tasks assessed/performed Overall Cognitive Status: Difficult to assess                                        Exercises Total Joint Exercises Ankle Circles/Pumps: AROM;Both;20 reps Quad Sets: AROM;Left;10 reps Gluteal Sets: AROM;Both;10 reps Heel Slides: AAROM;Left;5 reps;Limitations    General Comments        Pertinent Vitals/Pain Pain Assessment: Faces Faces Pain Scale: Hurts even more Pain Location: L thigh; briefly increases to where she moans/calls out with movement; the pain subsides quickly,  and after getting inthe chair, she reports did not have pain Pain Descriptors / Indicators: Grimacing Pain Intervention(s): Monitored during session    Home Living                      Prior Function            PT Goals (current goals can now be found in the care plan section) Acute Rehab PT Goals Patient Stated Goal: get stronger PT Goal Formulation: With  patient/family Time For Goal Achievement: 05/08/20 Potential to Achieve Goals: Good Progress towards PT goals: Progressing toward goals    Frequency    Min 3X/week      PT Plan Current plan remains appropriate    Co-evaluation              AM-PAC PT "6 Clicks" Mobility   Outcome Measure  Help needed turning from your back to your side while in a flat bed without using bedrails?: A Lot Help needed moving from lying on your back to sitting on the side of a flat bed without using bedrails?: A Lot Help needed moving to and from a bed to a chair (including a wheelchair)?: A Lot Help needed standing up from a chair using your arms (e.g., wheelchair or bedside chair)?: A Lot Help needed to walk in hospital room?: A Lot Help needed climbing 3-5 steps with a railing? : Total 6 Click Score: 11    End of Session Equipment Utilized During Treatment: Gait belt Activity Tolerance: Patient tolerated treatment well;Other (comment) (though fatigued at end of session) Patient left: in chair;with call bell/phone within reach;with chair alarm set;with family/visitor present Nurse Communication: Mobility status PT Visit Diagnosis: Unsteadiness on feet (R26.81);Other abnormalities of gait and mobility (R26.89);Muscle weakness (generalized) (M62.81);History of falling (Z91.81)     Time: 9977-4142 PT Time Calculation (min) (ACUTE ONLY): 22 min  Charges:  $Therapeutic Activity: 8-22 mins                     Roney Marion, PT  Acute Rehabilitation Services Pager [phone] Office [place] 04/29/2020, 5:48 PM

## 2020-04-29 NOTE — Plan of Care (Signed)

## 2020-04-29 NOTE — Discharge Instructions (Signed)
Distal Femur Fracture Treated With Immobilization  A distal femur fracture is a break in the lower part of the thigh bone (femur) near the knee joint. If the fracture is stable and the bone is still in the normal position (nondisplaced), the injury may be treated with immobilization. This involves the use of a cast or splint to hold the leg in place. Immobilization ensures that the bone continues to stay in the correct position while the leg is healing. What are the causes? This condition may be caused by:  A fall. This injury can result from the impact of the fall or other violent contact.  A motor vehicle accident.  A sports injury.  Other collisions with a hard surface. What increases the risk? You are more likely to develop this condition if:  You are female.  You are 54-69 years old.  You participate in high-energy sports such as soccer, ice hockey, football, and baseball.  You have a condition that weakens the bones, such as osteoporosis.  You have had a knee replacement. What are the signs or symptoms? Symptoms of this condition include:  Pain.  Swelling.  Bruising.  Inability to bend your knee.  Misshapen knee.  Inability to walk.  Inability to use your injured leg to support your body weight. How is this diagnosed? This condition may be diagnosed based on:  Your symptoms.  A physical exam.  Other tests, such as: ? Imaging studies, such as an X-ray, CT scan, MRI scan, or ultrasound. ? A procedure to view the inside of your knee with a small camera (arthroscopy). How is this treated? This condition may be treated with:  A splint. You will wear the splint until the swelling goes down.  A cast. This is used to keep the fractured bone from moving while it heals. A cast is usually put on after swelling has gone down.  Physical therapy. Follow these instructions at home: Medicines  Take over-the-counter and prescription medicines only as told by your  health care provider.  Do not drive or operate heavy machinery while taking pain medicine.  If you are taking prescription pain medicine, take actions to prevent or treat constipation. Your health care provider may recommend that you: ? Drink enough fluid to keep your urine pale yellow. ? Eat foods that are high in fiber, such as fresh fruits and vegetables, whole grains, and beans. ? Limit foods that are high in fat and processed sugars, such as fried or sweet foods. ? Take an over-the-counter or prescription medicine for constipation. If you have a splint:  Wear the splint as told by your health care provider. Remove it only as told by your health care provider.  Loosen the splint if your toes tingle, become numb, or turn cold and blue.  Keep the splint clean.  If the splint is not waterproof: ? Do not let it get wet. ? Cover it with a watertight covering when you take a bath or a shower. If you have a cast:  Do not stick anything inside the cast to scratch your skin. Doing that increases your risk of infection.  Check the skin around the cast every day. Tell your health care provider about any concerns.  You may put lotion on dry skin around the edges of the cast. Do not put lotion on the skin underneath the cast.  Keep the cast clean.  If the cast is not waterproof: ? Do not let it get wet. ? Cover it with a  watertight covering when you take a bath or a shower. Activity  Do not use your leg to support your body weight until your health care provider says that you can. Follow weight-bearing restrictions.  Use crutches, a cane, or a walker as directed.  Return to your normal activities as directed by your health care provider. Ask your health care provider what activities are safe for you.  Do not drive until your health care provider approves. Managing pain, stiffness, and swelling  If directed, put ice on the injured area: ? If you have a removable splint, remove it  as told by your health care provider. ? Put ice in a plastic bag. ? Place a towel between your skin and the bag. ? Leave the ice on for 20 minutes, 2-3 times a day.  Move your toes often to avoid stiffness and to lessen swelling.  Raise the injured area above the level of your heart while you are lying down.   General instructions  Do not put pressure on any part of the cast or splint until it is fully hardened. This may take several hours.  Do not use any products that contain nicotine or tobacco, such as cigarettes and e-cigarettes. These can delay bone healing. If you need help quitting, ask your health care provider.  Keep all follow-up visits as told by your health care provider. This is important.   Contact a health care provider if:  You have knee pain and swelling.  You have trouble walking.  Your cast becomes wet or damaged or suddenly feels too tight. Get help right away if:  Your pain and swelling get worse.  You have severe pain below the fracture.  Your skin or toenails turn blue or gray, feel cold, or become numb.  You have fluid, blood, or pus coming from under your cast.  You develop a fever.  You have pain, swelling, or redness in your leg. Summary  A distal femur fracture is a break in the lower part of the thigh bone (femur).  Falls are the most common causes of femur fractures, but sports-related injuries and motor vehicle accidents also can cause this.  If the fracture is stable and the bone is still in the normal position (nondisplaced), the injury may be treated with immobilization. Immobilization ensures that the bone continues to stay in the correct position while the leg is healing. This information is not intended to replace advice given to you by your health care provider. Make sure you discuss any questions you have with your health care provider. Document Revised: 03/14/2019 Document Reviewed: 05/04/2017 Elsevier Patient Education  2021  [place] Prevention in the Home, Adult Falls can cause injuries and can happen to people of all ages. There are many things you can do to make your home safe and to help prevent falls. Ask for help when making these changes. What actions can I take to prevent falls? General Instructions  Use good lighting in all rooms. Replace any light bulbs that burn out.  Turn on the lights in dark areas. Use night-lights.  Keep items that you use often in easy-to-reach places. Lower the shelves around your home if needed.  Set up your furniture so you have a clear path. Avoid moving your furniture around.  Do not have throw rugs or other things on the floor that can make you trip.  Avoid walking on wet floors.  If any of your floors are uneven, fix them.  Add color or contrast paint or tape to clearly mark and help you see: ? Grab bars or handrails. ? First and last steps of staircases. ? Where the edge of each step is.  If you use a stepladder: ? Make sure that it is fully opened. Do not climb a closed stepladder. ? Make sure the sides of the stepladder are locked in place. ? Ask someone to hold the stepladder while you use it.  Know where your pets are when moving through your home. What can I do in the bathroom?  Keep the floor dry. Clean up any water on the floor right away.  Remove soap buildup in the tub or shower.  Use nonskid mats or decals on the floor of the tub or shower.  Attach bath mats securely with double-sided, nonslip rug tape.  If you need to sit down in the shower, use a plastic, nonslip stool.  Install grab bars by the toilet and in the tub and shower. Do not use towel bars as grab bars.      What can I do in the bedroom?  Make sure that you have a light by your bed that is easy to reach.  Do not use any sheets or blankets for your bed that hang to the floor.  Have a firm chair with side arms that you can use for support when you get  dressed. What can I do in the kitchen?  Clean up any spills right away.  If you need to reach something above you, use a step stool with a grab bar.  Keep electrical cords out of the way.  Do not use floor polish or wax that makes floors slippery. What can I do with my stairs?  Do not leave any items on the stairs.  Make sure that you have a light switch at the top and the bottom of the stairs.  Make sure that there are handrails on both sides of the stairs. Fix handrails that are broken or loose.  Install nonslip stair treads on all your stairs.  Avoid having throw rugs at the top or bottom of the stairs.  Choose a carpet that does not hide the edge of the steps on the stairs.  Check carpeting to make sure that it is firmly attached to the stairs. Fix carpet that is loose or worn. What can I do on the outside of my home?  Use bright outdoor lighting.  Fix the edges of walkways and driveways and fix any cracks.  Remove anything that might make you trip as you walk through a door, such as a raised step or threshold.  Trim any bushes or trees on paths to your home.  Check to see if handrails are loose or broken and that both sides of all steps have handrails.  Install guardrails along the edges of any raised decks and porches.  Clear paths of anything that can make you trip, such as tools or rocks.  Have leaves, snow, or ice cleared regularly.  Use sand or salt on paths during winter.  Clean up any spills in your garage right away. This includes grease or oil spills. What other actions can I take?  Wear shoes that: ? Have a low heel. Do not wear high heels. ? Have rubber bottoms. ? Feel good on your feet and fit well. ? Are closed at the toe. Do not wear open-toe sandals.  Use tools that help you move around if needed. These include: ?  Canes. ? Walkers. ? Scooters. ? Crutches.  Review your medicines with your doctor. Some medicines can make you feel dizzy.  This can increase your chance of falling. Ask your doctor what else you can do to help prevent falls. Where to find more information  Centers for Disease Control and Prevention, STEADI: http://www.wolf.info/  National Institute on Aging: http://kim-miller.com/ Contact a doctor if:  You are afraid of falling at home.  You feel weak, drowsy, or dizzy at home.  You fall at home. Summary  There are many simple things that you can do to make your home safe and to help prevent falls.  Ways to make your home safe include removing things that can make you trip and installing grab bars in the bathroom.  Ask for help when making these changes in your home. This information is not intended to replace advice given to you by your health care provider. Make sure you discuss any questions you have with your health care provider. Document Revised: 10/17/2019 Document Reviewed: 10/17/2019 Elsevier Patient Education  [place].

## 2020-04-29 NOTE — Progress Notes (Signed)
Inpatient Rehabilitation  Patient information reviewed and entered into eRehab system by Leeanne Butters M. Kinan Safley, M.A., CCC/SLP, PPS Coordinator.  Information including medical coding, functional ability and quality indicators will be reviewed and updated through discharge.    

## 2020-04-29 NOTE — Progress Notes (Signed)
Inpatient Rehab Admissions Coordinator:    I have a bed available for pt to admit to CIR today. Dr. Tyrell Antonio in agreement.  Will let pt/family and TOC team know.   Shann Medal, PT, DPT Admissions Coordinator [phone] 04/29/20  10:56 AM

## 2020-04-29 NOTE — Progress Notes (Addendum)
ANTICOAGULATION CONSULT NOTE - Follow Up Consult  Pharmacy Consult for Warfarin Indication: atrial fibrillation  Allergies  Allergen Reactions  . Pradaxa [Dabigatran Etexilate Mesylate] Other (See Comments)    INTERNAL BLEEDING  . Clindamycin/Lincomycin Other (See Comments)    PT STATES HER DOCTOR TOLD HER NOT TO TAKE CLINDAMYCIN BECAUSE SHE GOT C-DIFF AFTER TAKING AMPICILLIN- "tolerated in 2022" (per spouse)  . Latex Other (See Comments)    Blisters   . Ampicillin Other (See Comments)    C-DIF AFTER TAKING AMPICILLIN- "tolerated in 2022" (per husband) Has patient had a PCN reaction causing immediate rash, facial/tongue/throat swelling, SOB or lightheadedness with hypotension: No Has patient had a PCN reaction causing severe rash involving mucus membranes or skin necrosis: No Has patient had a PCN reaction that required hospitalization: No Has patient had a PCN reaction occurring within the last 10 years: No If all of the above answers are "NO", then may proceed with Cephalosporin u  . Sulfamethoxazole Diarrhea    Patient Measurements: Height: 5\' 4"  (162.6 cm) Weight: 85.7 kg (188 lb 15 oz) IBW/kg (Calculated) : 54.7  Vital Signs: Temp: 98.4 F (36.9 C) (02/01 0536) Temp Source: Oral (02/01 0536) BP: 163/76 (02/01 0536) Pulse Rate: 84 (02/01 0536)  Labs: Recent Labs    04/27/20 0138 04/27/20 2778 04/27/20 0808 04/28/20 0220 04/28/20 1959 04/29/20 0106  HGB  --  7.6*   < > 6.8* 8.7* 8.0*  HCT  --  23.0*   < > 21.8* 27.4* 25.1*  PLT  --  244  --  252  --  269  LABPROT 16.9*  --   --  18.2*  --  18.2*  INR 1.4*  --   --  1.6*  --  1.6*  CREATININE  --  1.20*  --  1.26*  --   --    < > = values in this interval not displayed.    Estimated Creatinine Clearance: 35.2 mL/min (A) (by C-G formula based on SCr of 1.26 mg/dL (H)).   Medical History: Past Medical History:  Diagnosis Date  . Arthritis    "fingers, hips, feet, ankles" (11/10/2012)  . Asthma   . Atrial  fibrillation ([facility])    CHRONIC COUMADIN  . Breast cancer ([place]) 1990's   "cancer on one side; precancerous tissue on the other" (11/10/2012)  . Chronic bronchitis ([place])    "multiple times; not in a long time" (11/10/2012  . Depression   . GERD (gastroesophageal reflux disease)   . Hearing impaired   . Hypertension   . Irritable bowel   . Lymphedema    HX OF - IN LEFT ARM--NO NEEDLES OR B/P'S LEFT ARM  . Macular degeneration    "BEGINNINGS OF" MACULAR DEGENERATION  . Osteoporosis   . Pneumonia    "multiple times; not in a long time" (11/10/2012)  . Recurrent UTI   . Sleep apnea    "dx'd w/it; don't wear mask or anything" (11/10/2012)  . Stroke ([place]) ~ 2005   HX OF TIA-NO RESIDUAL PROBLEM--PARALYSIS RT SIDE FACE /PT'S MOUTH DROOPS-AND LOSS OF HEARING RT EAR --SINCE EAR SURGERY AS A CHILD   . UTI (lower urinary tract infection)    FREQUENT    Assessment: 87 year old with PMH significant for PAF on coumadin, recurrent UTI, HTN, HLD admitted to [facility] 04/22/2020 with acute closed left intratrochanteric hip fracture.Patient developed worsening dysphagia, MRI was obtain which showed acute stroke. Warfarin held per neurology recommendation. Pharmacy consulted to restart Warfarin on  1/31 - per neurology no bridging except aspirin until INR of 2.  Home warfarin regimen -Take 5 mg by mouth with supper on Sun/Mon/Wed/Fri/Sat and 7.5 mg on Tues/Thurs.  Today, INR 1.6 is subtherapeutic. Hgb 8.0. Plt wnl. No reported bleeding.   Goal of Therapy:  INR 2-3   Plan:  Warfarin 5 mg x1  Monitor daily INR and CBC Monitor s/s of bleeding   Cristela Felt, PharmD Clinical Pharmacist  04/29/2020, 8:00 AM

## 2020-04-29 NOTE — Discharge Summary (Signed)
Physician Discharge Summary  [name] [id number] DOB: 1933/09/16 DOA: 04/22/2020  PCP: Leanna Battles, MD  Admit date: 04/22/2020 Discharge date: 04/29/2020  Admitted From: Home  Disposition:  CIR  Recommendations for Outpatient Follow-up:  1. Follow up with PCP in 1-2 weeks 2. Please obtain BMP/CBC in one week 3. When INR is at 2, please discontinue aspirin.  4. Monitor Hb.      Discharge Condition Stable.  CODE STATUS: Full code Diet recommendation: Heart Healthy   Brief/Interim Summary: 87 year old with PMH significant for PAF on coumadin, recurrent UTI, HTN, HLD admitted to [facility] 04/22/2020 with acute closed left intratrochanteric hip fracture after presenting from home complaining with left hip pain.  X ray of left hip showed Mildly displaced and impacted intratrochanteric fracture of left femur.   Underwent cephalomedullary nailing of left left intertrochanteric femur fracture and removal of hardware left femur on 04/23/2020.   Patient develops worsening dysphagia, MRI was obtain which showed acute stroke.     1-Closed left Hip fracture;  -S/P intramedullary nail 1/26.  -PT recommending CIR>  -CIR evaluating.  -Pain management.  -stable to be transfer to CIR today.   2-Anemia due to acute blood loss anemia. Post sx.  Received one unit PRBC>this admission.  anemia panel consistent with iron deficiency.  Received  IV iron 1/30.  Hb decreased to 6.8. no evidence of bleeding.  Received  one unit PRBC> 1/31 Monitor hb level.   3-Acute metabolic encephalopathy;  Delirium, hospital, vs medications related.  Patient was notice to have swallow difficult during breakfast 1/29.  MRI ; obtain showed multiples small infarct  Started on B 12 supplement. MMA ordered and pending.  Mental status improved.   4-Multiples Small acute infarct;  MRI: Small acute infarcts in the right cerebellum, bilateral occipital, parietal lobes, and high left precentral  gyrus. Neurologist consulted. Appreciate evaluation.  Started on aspirin.  Back on coumadin. Bridge with aspirin until INR is at 2. Please discontinue aspirin when INR reach 2/   Dysphagia;  Report sore throat, swallowing difficulty 1/29.  She was choking with water. Difficult to swallow food notice 1/29 On chloraseptic spry, nystatin MRI positive for acute stroke.  Per husband patient appears much improved 1/30, swallowing better.  Started on regular diet. tolerating diet.   5-PAF Chronically on  coumadin, INR 1.6 Pharmacy adjusting coumadin.  Coumadin resume 1/31.  6-CKD stage 3a;  AKI with metabolic acidosis,  Peak to 1.8.  Improving.  Cr down to 1.2.   7-Vitamin D; started Vitamin D supplement.   8-HTN;  Hold Chlorthalidone and losartan due to recent AKI,.  Continue with toprol.  Added low dose Norvasc.   Leukocytosis; resolved.   HLD; Continue with lipitor.       Discharge Diagnoses:  Principal Problem:   Intertrochanteric fracture of left hip (HCC) Active Problems:   Paroxysmal atrial fibrillation (HCC)   Hypertension   Left hip pain   Leukocytosis   HLD (hyperlipidemia)   Confusion   Cerebral embolism with cerebral infarction    Discharge Instructions  Discharge Instructions    Diet - low sodium heart healthy   Complete by: As directed    Increase activity slowly   Complete by: As directed    No wound care   Complete by: As directed      Allergies as of 04/29/2020      Reactions   Pradaxa [dabigatran Etexilate Mesylate] Other (See Comments)   INTERNAL BLEEDING   Clindamycin/lincomycin Other (See  Comments)   PT STATES HER DOCTOR TOLD HER NOT TO TAKE CLINDAMYCIN BECAUSE SHE GOT C-DIFF AFTER TAKING AMPICILLIN- "tolerated in 2022" (per spouse)   Latex Other (See Comments)   Blisters   Ampicillin Other (See Comments)   C-DIF AFTER TAKING AMPICILLIN- "tolerated in 2022" (per husband) Has patient had a PCN reaction causing immediate  rash, facial/tongue/throat swelling, SOB or lightheadedness with hypotension: No Has patient had a PCN reaction causing severe rash involving mucus membranes or skin necrosis: No Has patient had a PCN reaction that required hospitalization: No Has patient had a PCN reaction occurring within the last 10 years: No If all of the above answers are "NO", then may proceed with Cephalosporin u   Sulfamethoxazole Diarrhea      Medication List    STOP taking these medications   chlorthalidone 50 MG tablet Commonly known as: HYGROTON   losartan 100 MG tablet Commonly known as: COZAAR   saccharomyces boulardii 250 MG capsule Commonly known as: FLORASTOR     TAKE these medications   amLODipine 2.5 MG tablet Commonly known as: NORVASC Take 1 tablet (2.5 mg total) by mouth daily.   aspirin 325 MG tablet Take 1 tablet (325 mg total) by mouth daily.   atorvastatin 20 MG tablet Commonly known as: LIPITOR Take 20 mg by mouth daily after supper.   cyanocobalamin 1000 MCG tablet Take 1 tablet (1,000 mcg total) by mouth daily.   docusate sodium 100 MG capsule Commonly known as: COLACE Take 1 capsule (100 mg total) by mouth 2 (two) times daily.   estradiol 0.1 MG/GM vaginal cream Commonly known as: ESTRACE Place 1 Applicatorful vaginally 2 (two) times a week.   loperamide 2 MG capsule Commonly known as: IMODIUM Take 2-4 mg by mouth 5 (five) times daily as needed for diarrhea or loose stools.   metoprolol succinate 25 MG 24 hr tablet Commonly known as: TOPROL-XL Take 1 tablet (25 mg total) by mouth daily.   nystatin 100000 UNIT/ML suspension Commonly known as: MYCOSTATIN Take 5 mLs (500,000 Units total) by mouth 4 (four) times daily.   PreserVision AREDS Caps Take 1 capsule by mouth 2 (two) times daily.   One-A-Day Proactive 65+ Tabs Take 1 tablet by mouth daily with breakfast.   Systane Ultra PF 0.4-0.3 % Soln Generic drug: Polyethyl Glyc-Propyl Glyc PF Place 1 drop into  both eyes at bedtime.   Vitamin D3 25 MCG tablet Commonly known as: Vitamin D Take 1 tablet (1,000 Units total) by mouth daily.   VSL#3 PO Take 1 capsule by mouth in the morning.   warfarin 5 MG tablet Commonly known as: COUMADIN Take 5-7.5 mg by mouth See admin instructions. Take 5 mg by mouth with supper (evening meal) on Sun/Mon/Wed/Fri/Sat and 7.5 mg on Tues/Thurs       Allergies  Allergen Reactions  . Pradaxa [Dabigatran Etexilate Mesylate] Other (See Comments)    INTERNAL BLEEDING  . Clindamycin/Lincomycin Other (See Comments)    PT STATES HER DOCTOR TOLD HER NOT TO TAKE CLINDAMYCIN BECAUSE SHE GOT C-DIFF AFTER TAKING AMPICILLIN- "tolerated in 2022" (per spouse)  . Latex Other (See Comments)    Blisters   . Ampicillin Other (See Comments)    C-DIF AFTER TAKING AMPICILLIN- "tolerated in 2022" (per husband) Has patient had a PCN reaction causing immediate rash, facial/tongue/throat swelling, SOB or lightheadedness with hypotension: No Has patient had a PCN reaction causing severe rash involving mucus membranes or skin necrosis: No Has patient had a PCN reaction that  required hospitalization: No Has patient had a PCN reaction occurring within the last 10 years: No If all of the above answers are "NO", then may proceed with Cephalosporin u  . Sulfamethoxazole Diarrhea    Consultations:  Neurology  orthopoedic   Procedures/Studies: DG Chest 1 View  Result Date: 04/22/2020 CLINICAL DATA:  Preoperative evaluation EXAM: CHEST  1 VIEW COMPARISON:  08/06/2017 FINDINGS: Chronic interstitial prominence with scarring at the lung bases. Possible mild superimposed interstitial edema. Mild cardiomegaly. No pleural effusion or pneumothorax. IMPRESSION: Chronic interstitial prominence with possible superimposed mild interstitial edema. Mild cardiomegaly. Electronically Signed   By: Macy Mis M.D.   On: 04/22/2020 16:09   MR ANGIO HEAD WO CONTRAST  Result Date:  04/27/2020 CLINICAL DATA:  Stroke follow-up EXAM: MRA HEAD WITHOUT CONTRAST TECHNIQUE: Angiographic images of the Circle of Willis were obtained using MRA technique without intravenous contrast. COMPARISON:  MRI head 04/27/2020 FINDINGS: Suboptimal image quality due to patient motion. In addition, there is venous contrast present. I discussed with the technologist from the study who insisted no contrast was injected prior to the MRA of the head Left vertebral artery dominant and supplies the basilar. Small right vertebral artery patent to the basilar. Basilar is irregular but patent. Posterior cerebral arteries are patent bilaterally with mild atherosclerotic irregularity. Internal carotid artery patent through the cavernous segment. There is considerable cavernous sinus contrast obscuring the cavernous carotid. Anterior and middle cerebral arteries patent bilaterally. Moderate stenosis in the anterior and middle cerebral arteries bilaterally. No large vessel occlusion or aneurysm. IMPRESSION: Suboptimal study due to venous contrast and patient motion Moderate intracranial atherosclerotic disease. No large vessel occlusion. Electronically Signed   By: Franchot Gallo M.D.   On: 04/27/2020 17:00   MR ANGIO NECK W WO CONTRAST  Result Date: 04/27/2020 CLINICAL DATA:  Stroke EXAM: MRA NECK WITHOUT AND WITH CONTRAST TECHNIQUE: Multiplanar and multiecho pulse sequences of the neck were obtained without and with intravenous contrast. Angiographic images of the neck were obtained using MRA technique without and with intravenous contrast. CONTRAST:  8.78mL GADAVIST GADOBUTROL 1 MMOL/ML IV SOLN COMPARISON:  None. FINDINGS: Suboptimal study due to extensive venous contamination. During contrast infusion, the MRI equipment did not automatically begin scanning during the arterial phase. Scanning was started manually during the venous phase. In addition, there is motion on the study degrading image quality. Carotid artery is  patent bilaterally. Both vertebral arteries are patent. IMPRESSION: Limited study due to venous phase scanning and patient motion. No large vessel occlusion in the neck. Electronically Signed   By: Franchot Gallo M.D.   On: 04/27/2020 17:03   MR BRAIN WO CONTRAST  Result Date: 04/27/2020 CLINICAL DATA:  Cranial neuropathy 9 EXAM: MRI HEAD WITHOUT CONTRAST TECHNIQUE: Multiplanar, multiecho pulse sequences of the brain and surrounding structures were obtained without intravenous contrast. COMPARISON:  01/09/2017 FINDINGS: Brain: Subcentimeter acute infarcts in the right cerebellum, bilateral occipitoparietal cortex, and high left precentral gyrus. There has been a large remote left cerebellar infarction. Small remote occipital parietal infarcts. Confluent chronic small vessel ischemia in the cerebral white matter. Chronic small vessel ischemia to a milder degree in the brainstem where there is no visible acute infarct. Chronic ischemic injury to the deep gray nuclei accentuated by dilated perivascular spaces. Remote hypertensive pattern micro hemorrhages in the deep cerebellum and supratentorial brain. Generalized brain atrophy. No acute hemorrhage, hydrocephalus, or masslike finding. Vascular: Preserved flow voids Skull and upper cervical spine: Normal marrow signal. C4-5 facet ankylosis. Sinuses/Orbits: Bilateral cataract  resection. Minor mucosal thickening in paranasal sinuses. Right mastoidectomy. IMPRESSION: 1. Small acute infarcts in the right cerebellum, bilateral occipital parietal lobes, and high left precentral gyrus. 2. No acute infarct to correlate with the cranial nerve deficit. Thin slices through the brainstem could not be obtained due to patient condition. 3. Advanced chronic ischemic injury. Electronically Signed   By: Monte Fantasia M.D.   On: 04/27/2020 06:00   DG CHEST PORT 1 VIEW  Result Date: 04/26/2020 CLINICAL DATA:  Cough. EXAM: PORTABLE CHEST 1 VIEW COMPARISON:  04/22/2020 FINDINGS:  Stable enlarged cardiac silhouette and tortuous and calcified thoracic aorta. The lungs remain clear with normal vascularity. Stable mild interstitial prominence without Kerley lines. Diffuse osteopenia. IMPRESSION: No acute abnormality. Stable cardiomegaly and mild chronic interstitial lung disease. Electronically Signed   By: Claudie Revering M.D.   On: 04/26/2020 12:18   DG Humerus Left  Result Date: 04/22/2020 CLINICAL DATA:  Fall, left arm pain EXAM: LEFT HUMERUS - 2+ VIEW COMPARISON:  08/09/2017 FINDINGS: Degenerative changes in the left [place] joint. No acute bony abnormality. Specifically, no fracture, subluxation, or dislocation. IMPRESSION: No acute bony abnormality. Electronically Signed   By: Rolm Baptise M.D.   On: 04/22/2020 18:09   DG C-Arm 1-60 Min  Result Date: 04/23/2020 CLINICAL DATA:  Left intratrochanteric hip fracture EXAM: DG C-ARM 1-60 MIN; OPERATIVE LEFT HIP WITH PELVIS COMPARISON:  04/22/2020 FLUOROSCOPY TIME:  Fluoroscopy Time:  1 minutes 59 seconds Radiation Exposure Index (if provided by the fluoroscopic device): 23.44 mGy Number of Acquired Spot Images: 6 FINDINGS: Fixation sideplate is again noted along the mid to distal femur. Fracture fragments have been reduced initially with placement of a proximal medullary rod with 2 fixation screws traversing the femoral neck. Fracture fragments are in near anatomic alignment. IMPRESSION: ORIF of proximal left femur fracture. Electronically Signed   By: Inez Catalina M.D.   On: 04/23/2020 16:07   ECHOCARDIOGRAM COMPLETE  Result Date: 04/27/2020    ECHOCARDIOGRAM REPORT   Patient Name:   MASHAWN BRAZIL Date of Exam: 04/27/2020 Medical Rec #:  [id number]    Height:       64.0 in Accession #:    [id number]   Weight:       188.9 lb Date of Birth:  06/28/1933    BSA:          1.910 m Patient Age:    89 years     BP:           170/62 mmHg Patient Gender: F            HR:           87 bpm. Exam Location:  Inpatient Procedure: 2D Echo, Color Doppler and  Cardiac Doppler Indications:    Stroke 434.91 / I163.9  History:        Patient has prior history of Echocardiogram examinations, most                 recent 08/07/2017. Stroke, Arrythmias:Atrial Fibrillation; Risk                 Factors:Hypertension and Dyslipidemia. GERD.  Sonographer:    Darlina Sicilian RDCS Referring Phys: [id number] [place]  Sonographer Comments: Exam terminated per patient's request. IMPRESSIONS  1. Left ventricular ejection fraction, by estimation, is 70 to 75%. The left ventricle has hyperdynamic function. The left ventricle has no regional wall motion abnormalities. There is mild concentric left ventricular hypertrophy. Left ventricular diastolic function could not  be evaluated.  2. Right ventricular systolic function is normal. The right ventricular size is normal. There is normal pulmonary artery systolic pressure.  3. Left atrial size was moderately dilated.  4. The mitral valve is normal in structure. No evidence of mitral valve regurgitation. No evidence of mitral stenosis. Moderate mitral annular calcification.  5. The aortic valve is tricuspid. Aortic valve regurgitation is mild. Mild aortic valve sclerosis is present, with no evidence of aortic valve stenosis.  6. The inferior vena cava is normal in size with greater than 50% respiratory variability, suggesting right atrial pressure of 3 mmHg. Conclusion(s)/Recommendation(s): Incomplete exam. Further imaging declined by the patient. Unable to evaluate for atrial shunting or assess diastolic function. FINDINGS  Left Ventricle: Left ventricular ejection fraction, by estimation, is 70 to 75%. The left ventricle has hyperdynamic function. The left ventricle has no regional wall motion abnormalities. The left ventricular internal cavity size was normal in size. There is mild concentric left ventricular hypertrophy. Left ventricular diastolic function could not be evaluated. Right Ventricle: The right ventricular size is normal. No  increase in right ventricular wall thickness. Right ventricular systolic function is normal. There is normal pulmonary artery systolic pressure. The tricuspid regurgitant velocity is 2.78 m/s, and  with an assumed right atrial pressure of 3 mmHg, the estimated right ventricular systolic pressure is [id number] mmHg. Left Atrium: Left atrial size was moderately dilated. Right Atrium: Right atrial size was normal in size. Pericardium: There is no evidence of pericardial effusion. Mitral Valve: The mitral valve is normal in structure. Moderate mitral annular calcification. No evidence of mitral valve regurgitation. No evidence of mitral valve stenosis. Tricuspid Valve: The tricuspid valve is normal in structure. Tricuspid valve regurgitation is not demonstrated. No evidence of tricuspid stenosis. Aortic Valve: The aortic valve is tricuspid. Aortic valve regurgitation is mild. Mild aortic valve sclerosis is present, with no evidence of aortic valve stenosis. Pulmonic Valve: The pulmonic valve was grossly normal. Pulmonic valve regurgitation is not visualized. No evidence of pulmonic stenosis. Aorta: The aortic root is normal in size and structure. Venous: The inferior vena cava is normal in size with greater than 50% respiratory variability, suggesting right atrial pressure of 3 mmHg. IAS/Shunts: The interatrial septum was not assessed.  LEFT VENTRICLE PLAX 2D LVIDd:         4.40 cm LVIDs:         2.90 cm LV PW:         1.20 cm LV IVS:        1.30 cm LVOT diam:     2.10 cm LVOT Area:     3.46 cm  LEFT ATRIUM           Index LA diam:      1.70 cm 0.89 cm/m LA Vol (A4C): 98.2 ml 51.42 ml/m   AORTA Ao Root diam: 3.70 cm Ao Asc diam:  3.40 cm TRICUSPID VALVE TR Peak grad:   30.9 mmHg TR Vmax:        278.00 cm/s  SHUNTS Systemic Diam: 2.10 cm Dani Gobble Croitoru MD Electronically signed by Sanda Klein MD Signature Date/Time: 04/27/2020/2:02:17 PM    Final    DG HIP PORT UNILAT W OR W/O PELVIS 1V LEFT  Result Date:  04/23/2020 CLINICAL DATA:  Postop ORIF for left hip fracture. EXAM: DG HIP (WITH OR WITHOUT PELVIS) 1V PORT LEFT COMPARISON:  Radiographs 04/22/2020. Intraoperative view is earlier today. FINDINGS: Status post ORIF of the intertrochanteric left femur fracture with a intramedullary nail  and 2 fixation screws traversing the femoral neck. The hardware is well positioned. There is improved alignment of the main fracture fragments. Pre-existing lateral femoral diaphyseal plate and screws are incompletely visualized, but appear intact. Two of the proximal screws from that plate have been removed in the interval. IMPRESSION: Improved alignment of intertrochanteric left femur fracture status post ORIF. Electronically Signed   By: Carey Bullocks M.D.   On: 04/23/2020 16:57   DG HIP OPERATIVE UNILAT W OR W/O PELVIS LEFT  Result Date: 04/23/2020 CLINICAL DATA:  Left intratrochanteric hip fracture EXAM: DG C-ARM 1-60 MIN; OPERATIVE LEFT HIP WITH PELVIS COMPARISON:  04/22/2020 FLUOROSCOPY TIME:  Fluoroscopy Time:  1 minutes 59 seconds Radiation Exposure Index (if provided by the fluoroscopic device): 23.44 mGy Number of Acquired Spot Images: 6 FINDINGS: Fixation sideplate is again noted along the mid to distal femur. Fracture fragments have been reduced initially with placement of a proximal medullary rod with 2 fixation screws traversing the femoral neck. Fracture fragments are in near anatomic alignment. IMPRESSION: ORIF of proximal left femur fracture. Electronically Signed   By: Alcide Clever M.D.   On: 04/23/2020 16:07   DG Hip Unilat With Pelvis 2-3 Views Left  Result Date: 04/22/2020 CLINICAL DATA:  Fall, pain EXAM: DG HIP (WITH OR WITHOUT PELVIS) 2-3V LEFT COMPARISON:  08/08/2017 FINDINGS: Osteopenia. Mildly displaced and impacted intratrochanteric fractures of the proximal left femur. The pelvis and proximal right femur appear intact in frontal view only. Partially imaged plate and screw fixation of the femoral  diaphysis. IMPRESSION: 1. Mildly displaced and impacted intratrochanteric fractures of the proximal left femur. 2. The pelvis and proximal right femur appear intact in frontal view only. Electronically Signed   By: Lauralyn Primes M.D.   On: 04/22/2020 16:23    Subjective: Alert, mild cough. Feels better.    Discharge Exam: Vitals:   04/28/20 2013 04/29/20 0536  BP: (!) 127/52 (!) 163/76  Pulse: 84 84  Resp: 17 18  Temp: 98.1 F (36.7 C) 98.4 F (36.9 C)  SpO2: 94% 95%     General: Pt is alert, awake, not in acute distress Cardiovascular: RRR, S1/S2 +, no rubs, no gallops Respiratory: CTA bilaterally, no wheezing, no rhonchi Abdominal: Soft, NT, ND, bowel sounds + Extremities: no edema, no cyanosis    The results of significant diagnostics from this hospitalization (including imaging, microbiology, ancillary and laboratory) are listed below for reference.     Microbiology: Recent Results (from the past 240 hour(s))  SARS CORONAVIRUS 2 (TAT 6-24 HRS) Nasopharyngeal Nasopharyngeal Swab     Status: None   Collection Time: 04/22/20  5:21 PM   Specimen: Nasopharyngeal Swab  Result Value Ref Range Status   SARS Coronavirus 2 NEGATIVE NEGATIVE Final    Comment: (NOTE) SARS-CoV-2 target nucleic acids are NOT DETECTED.  The SARS-CoV-2 RNA is generally detectable in upper and lower respiratory specimens during the acute phase of infection. Negative results do not preclude SARS-CoV-2 infection, do not rule out co-infections with other pathogens, and should not be used as the sole basis for treatment or other patient management decisions. Negative results must be combined with clinical observations, patient history, and epidemiological information. The expected result is Negative.  Fact Sheet for Patients: HairSlick.no  Fact Sheet for Healthcare Providers: quierodirigir.com  This test is not yet approved or cleared by the  [place] FDA and  has been authorized for detection and/or diagnosis of SARS-CoV-2 by FDA under an Emergency Use Authorization (EUA). This EUA will  remain  in effect (meaning this test can be used) for the duration of the COVID-19 declaration under Se ction 564(b)(1) of the Act, 21 U.S.C. section 360bbb-3(b)(1), unless the authorization is terminated or revoked sooner.  Performed at [facility], [place] [address], [place], [place] [address]   Surgical pcr screen     Status: None   Collection Time: 04/23/20  1:54 PM   Specimen: Nasal Mucosa; Nasal Swab  Result Value Ref Range Status   MRSA, PCR NEGATIVE NEGATIVE Final   Staphylococcus aureus NEGATIVE NEGATIVE Final    Comment: (NOTE) The Xpert SA Assay (FDA approved for NASAL specimens in patients 76 years of age and older), is one component of a comprehensive surveillance program. It is not intended to diagnose infection nor to guide or monitor treatment. Performed at [facility], [place] [address], [place], [place] [address]      Labs: BNP (last 3 results) No results for input(s): BNP in the last 8760 hours. Basic Metabolic Panel: Recent Labs  Lab 04/23/20 0500 04/24/20 0827 04/25/20 0741 04/26/20 0115 04/27/20 0808 04/28/20 0220  NA 135 136 138 138 139 139  K 3.8 4.2 4.3 4.7 3.9 3.7  CL 103 106 108 111 109 110  CO2 21* 21* 21* 20* 20* 20*  GLUCOSE 130* 135* 119* 136* 126* 106*  BUN 26* 29* 28* 30* 22 25*  CREATININE 1.17* 1.85* 1.75* 1.48* 1.20* 1.26*  CALCIUM 8.7* 8.1* 8.0* 7.7* 8.3* 8.0*  MG 1.7  --   --   --   --   --    Liver Function Tests: No results for input(s): AST, ALT, ALKPHOS, BILITOT, PROT, ALBUMIN in the last 168 hours. No results for input(s): LIPASE, AMYLASE in the last 168 hours. No results for input(s): AMMONIA in the last 168 hours. CBC: Recent Labs  Lab 04/22/20 1650 04/23/20 0500 04/24/20 0827 04/24/20 1628 04/25/20 0741 04/26/20 0115 04/27/20 0808 04/28/20 0220  04/28/20 1959 04/29/20 0106  WBC 11.7* 9.2   < > 8.6 8.0 9.0 9.2 7.1  --  7.3  NEUTROABS 10.4* 7.1  --  6.1  --   --   --   --   --   --   HGB 10.1* 8.9*   < > 7.3* 7.0* 7.0* 7.6* 6.8* 8.7* 8.0*  HCT 30.8* 28.3*   < > 23.2* 21.4* 22.6* 23.0* 21.8* 27.4* 25.1*  MCV 88.5 87.9   < > 87.5 87.3 90.4 87.1 90.1  --  89.3  PLT 269 246   < > 188 175 189 244 252  --  269   < > = values in this interval not displayed.   Cardiac Enzymes: No results for input(s): CKTOTAL, CKMB, CKMBINDEX, TROPONINI in the last 168 hours. BNP: Invalid input(s): POCBNP CBG: Recent Labs  Lab 04/23/20 1229  GLUCAP 145*   D-Dimer No results for input(s): DDIMER in the last 72 hours. Hgb A1c Recent Labs    04/28/20 0220  HGBA1C 5.6   Lipid Profile Recent Labs    04/28/20 0220  CHOL 100  HDL 36*  LDLCALC 38  TRIG 129  CHOLHDL 2.8   Thyroid function studies Recent Labs    04/27/20 1105  TSH 1.532   Anemia work up Recent Labs    04/27/20 0134 04/27/20 0138  VITAMINB12  --  319  FOLATE 18.1  --   FERRITIN  --  385*  TIBC  --  171*  IRON  --  17*  RETICCTPCT  --  2.2   Urinalysis    Component Value Date/Time   COLORURINE YELLOW 04/23/2020 0013   APPEARANCEUR CLEAR 04/23/2020 0013   LABSPEC 1.017 04/23/2020 0013   PHURINE 6.0 04/23/2020 0013   GLUCOSEU NEGATIVE 04/23/2020 0013   HGBUR NEGATIVE 04/23/2020 0013   BILIRUBINUR NEGATIVE 04/23/2020 0013   KETONESUR NEGATIVE 04/23/2020 0013   PROTEINUR NEGATIVE 04/23/2020 0013   UROBILINOGEN 0.2 07/28/2014 1239   NITRITE NEGATIVE 04/23/2020 0013   LEUKOCYTESUR TRACE (A) 04/23/2020 0013   Sepsis Labs Invalid input(s): PROCALCITONIN,  WBC,  LACTICIDVEN Microbiology Recent Results (from the past 240 hour(s))  SARS CORONAVIRUS 2 (TAT 6-24 HRS) Nasopharyngeal Nasopharyngeal Swab     Status: None   Collection Time: 04/22/20  5:21 PM   Specimen: Nasopharyngeal Swab  Result Value Ref Range Status   SARS Coronavirus 2 NEGATIVE NEGATIVE Final     Comment: (NOTE) SARS-CoV-2 target nucleic acids are NOT DETECTED.  The SARS-CoV-2 RNA is generally detectable in upper and lower respiratory specimens during the acute phase of infection. Negative results do not preclude SARS-CoV-2 infection, do not rule out co-infections with other pathogens, and should not be used as the sole basis for treatment or other patient management decisions. Negative results must be combined with clinical observations, patient history, and epidemiological information. The expected result is Negative.  Fact Sheet for Patients: SugarRoll.be  Fact Sheet for Healthcare Providers: https://www.woods-mathews.com/  This test is not yet approved or cleared by the [place] FDA and  has been authorized for detection and/or diagnosis of SARS-CoV-2 by FDA under an Emergency Use Authorization (EUA). This EUA will remain  in effect (meaning this test can be used) for the duration of the COVID-19 declaration under Se ction 564(b)(1) of the Act, 21 U.S.C. section 360bbb-3(b)(1), unless the authorization is terminated or revoked sooner.  Performed at [facility], [address] [place], [place] [address]   Surgical pcr screen     Status: None   Collection Time: 04/23/20  1:54 PM   Specimen: Nasal Mucosa; Nasal Swab  Result Value Ref Range Status   MRSA, PCR NEGATIVE NEGATIVE Final   Staphylococcus aureus NEGATIVE NEGATIVE Final    Comment: (NOTE) The Xpert SA Assay (FDA approved for NASAL specimens in patients 28 years of age and older), is one component of a comprehensive surveillance program. It is not intended to diagnose infection nor to guide or monitor treatment. Performed at [facility], [place] [address], [place], [place] [address]      Time coordinating discharge: 40 minutes  SIGNED:   Elmarie Shiley, MD  [facility]

## 2020-04-29 NOTE — IPOC Note (Signed)
Individualized overall Plan of Care [facility]) Patient Details Name: [name] MRN: [id number] DOB: 1933/11/03  Admitting Diagnosis: Intertrochanteric fracture of left hip [facility])  Hospital Problems: Principal Problem:   Intertrochanteric fracture of left hip ([place]) Active Problems:   Pressure injury of skin   Congestion of nasal sinus   Stage 3 chronic kidney disease (HCC)   Benign essential HTN     Functional Problem List: Nursing Bladder,Bowel,Pain,Endurance,Motor,Safety,Perception,Medication Management,Edema  PT Balance,Endurance,Motor,Pain,Safety,Perception  OT Balance,Cognition,Endurance,Safety,Perception,Pain,Motor  SLP Cognition,Linguistic  TR         Basic ADL's: OT Bathing,Dressing,Toileting     Advanced  ADL's: OT       Transfers: PT Bed Mobility,Bed to [employer]  OT Toilet     Locomotion: PT Ambulation,Wheelchair Mobility,Stairs     Additional Impairments: OT None  SLP None      TR      Anticipated Outcomes Item Anticipated Outcome  Self Feeding independent  Swallowing  N/A   Basic self-care  mod A overall  Toileting  mod A   Bathroom Transfers mod A  Bowel/Bladder  manage bowel and bladder with supervision  Transfers  modA with LRAD  Locomotion  modA with LRAD  Communication  mod I  Cognition  mod I  Pain  pain level less than 4 on scale of 0-10  Safety/Judgment  remain free of injury, prevent falls with cues and reminders   Therapy Plan: PT Intensity: Minimum of 1-2 x/day ,45 to 90 minutes PT Frequency: 5 out of 7 days PT Duration Estimated Length of Stay: 2.5 weeks OT Intensity: Minimum of 1-2 x/day, 45 to 90 minutes OT Frequency: 5 out of 7 days OT Duration/Estimated Length of Stay: 16-18 days SLP Intensity: Minumum of 1-2 x/day, 30 to 90 minutes SLP Frequency: 3 to 5 out of 7 days SLP Duration/Estimated Length of Stay: 16-18 days (likely shorter for speech)    Team Interventions: Nursing Interventions Patient/Family  Education,Pain Management,Bladder Management,Bowel Management,Discharge Planning,Medication Management,Psychosocial Support,Skin Care/Wound Management,Disease Management/Prevention  PT interventions Ambulation/gait training,Balance/vestibular training,Community reintegration,Cognitive remediation/compensation,Discharge planning,Disease management/prevention,Functional electrical stimulation,DME/adaptive equipment instruction,Functional mobility training,Neuromuscular re-education,Patient/family education,Pain management,Psychosocial support,Skin care/wound management,Stair training,Splinting/orthotics,Therapeutic Activities,Therapeutic Exercise,UE/LE Coordination activities,UE/LE Strength taining/ROM,Visual/perceptual remediation/compensation,Wheelchair propulsion/positioning  OT Interventions Balance/vestibular training,Cognitive remediation/compensation,Discharge planning,Disease Pharmacologist instruction,Functional mobility training,Neuromuscular re-education,Pain management,Psychosocial support,Patient/family education,Self Care/advanced ADL retraining,Therapeutic Activities,Therapeutic Exercise,UE/LE Strength taining/ROM,UE/LE Coordination activities,Visual/perceptual remediation/compensation  SLP Interventions Cognitive remediation/compensation,Speech/Language facilitation,Patient/family education,Functional tasks,Cueing hierarchy  TR Interventions    SW/CM Interventions Psychosocial Support,Patient/Family Education,Discharge Planning   Barriers to Discharge MD  Medical stability, Weight, and HOH  Nursing      PT Decreased caregiver support,Insurance for SNF coverage,Weight bearing restrictions,Home environment access/layout,Lack of/limited family support    OT      SLP Other (comments) N/A  SW Decreased caregiver support,Lack of/limited family support     Team Discharge Planning: Destination: PT-Home ,OT- Home , SLP-Home Projected Follow-up: PT-Home health  PT, OT-  Home health OT, SLP-None Projected Equipment Needs: PT-To be determined, OT- To be determined, SLP-None recommended by SLP Equipment Details: PT-Pt owns a w/c and RW, OT-  Patient/family involved in discharge planning: PT- Patient,Family member/caregiver,  OT-Patient,Family member/caregiver, SLP-Patient  MD ELOS: 18-22 days. Medical Rehab Prognosis:  Good Assessment: 87 year old right-handed female with history of atrial fibrillation on chronic Coumadin, CKD stage III with baseline 1.46, breast cancer with bilateral mastectomy 1990s, hearing impaired, hypertension, TIA without residual weakness, recurrent UTI, hyperlipidemia, bilateral total knee replacements as well as left femur fracture May 2019 with ORIF,. Requires some assistance for lower body ADLs.  Presented  04/22/2020 after a fall without loss of consciousness landing on her left hip.  She stated she leaned too far forward with falling out of her wheelchair.  Admission chemistries hemoglobin 10.1 WBC 11,700, BUN 30, creatinine 1.46, urinalysis negative nitrite, INR 2.3.  X-rays and imaging revealed left intertrochanteric femur fracture as well as healed distal femur fracture.  Patient did receive vitamin K to reverse Coumadin.  Underwent nailing of left intertrochanteric femur fracture removal of hardware left femur 04/23/2020 per Dr. Doreatha Martin.  Weightbearing as tolerated left lower extremity.  Postoperative chronic Coumadin resumed.  Acute blood loss anemia 6.8 she did receive 1 unit packed red blood cells latest hemoglobin 8.0..  Creatinine remained stable 1.75 from baseline 1.46.  On 04/27/2020 patient with altered mental status reportedly having difficulty swallowing.  MRI of the brain showed small acute infarct in the right cerebellum bilaterally occipital parietal lobes and high left precentral gyrus.  Patient did not receive TPA.  MRA of the head and neck showed no large vessel occlusion.  Echocardiogram showed ejection fraction of 70 to  75% no regional wall motion abnormalities.  Patient's chronic Coumadin initially placed on hold and has been resumed no need for bridging except patient would remain on aspirin until INR greater than 2.00 and then discontinue.  Tolerating a regular diet. Patient with resulting functional deficits with mobility, transfers, endurance, self-care.  Will set goals for Mod A with PT/OT and Mod I with SLP.  Due to the current state of emergency, patients may not be receiving their 3-hours of Medicare-mandated therapy.  See Team Conference Notes for weekly updates to the plan of care

## 2020-04-29 NOTE — Progress Notes (Signed)
PMR Admission Coordinator Pre-Admission Assessment   Patient: [name] is an 87 y.o., female MRN: [id number] DOB: 10/02/1933 Height: 5\' 4"  (162.6 cm) Weight: 85.7 kg                                                                                                                                                  Insurance Information HMO:     PPO:      PCP:      IPA:      80/20:      OTHER:  PRIMARY: Medicare Part A and B      Policy#: [id number]    Subscriber: patient CM Name:       Phone#:      Fax#:  Pre-Cert#: verified Civil engineer, contracting:  Benefits:  Phone #:      Name:  Eff. Date: 06/27/2000 A and B     Deduct: $1556      Out of Pocket Max: n/a      Life Max: n/a CIR: 100%      SNF: 20 full days Outpatient: 80%     Co-Pay: 20% Home Health: 100%      Co-Pay:  DME: 80%     Co-Pay: 20% Providers: pt choice   SECONDARY: [employer]   Policy#: [id number]      Phone#:    Financial Counselor:       Phone#:    The "Data Collection Information Summary" for patients in Inpatient Rehabilitation Facilities with attached "Privacy Act [place] Records" was provided and verbally reviewed with: Patient and Family   Emergency Contact Information         Contact Information     Name Relation Home Work Mobile    Storla L [place]   [phone]       Current Medical History  Patient Admitting Diagnosis: Closed L hip fracture    History of Present Illness: [name] is an 87 year old right-handed female with history of atrial fibrillation on chronic Coumadin, CKD stage III with baseline 1.46, breast cancer with bilateral mastectomy 1990s, hearing impaired, hypertension, TIA without residual weakness, recurrent UTI, hyperlipidemia, bilateral total knee replacements as well as left femur fracture May 2019 with ORIF.  Presented 04/22/2020 after a fall, no loss of consciousness, with pt landing on her left hip.  She stated she leaned too far forward and ended up  falling out of her chair.  Admission chemistries hemoglobin 10.1 WBC 11,700, BUN 30, creatinine 1.46, urinalysis negative nitrite, INR 2.3.  X-rays and imaging revealed left intertrochanteric femur fracture as well as healed distal femur fracture.  Patient did receive vitamin K to reverse Coumadin.  Underwent nailing of left intertrochanteric femur fracture removal of hardware left femur 04/23/2020 per Dr. Doreatha Martin.  Weightbearing as tolerated left lower extremity.  Postoperative, chronic Coumadin  resumed.  Acute blood loss anemia 6.8 she did receive 1 unit packed red blood cells latest hemoglobin 8.0..  Creatinine remained stable 1.75 from baseline 1.46.  On 04/27/2020 patient with altered mental status reportedly having difficulty swallowing.  MRI of the brain showed small acute infarct in the right cerebellum bilaterally, occipitoparietal lobes, and high left precentral gyrus.  Patient did not receive TPA.  MRA of the head and neck showed no large vessel occlusion.  Echocardiogram showed ejection fraction of 70 to 75% no regional wall motion abnormalities.  Patient's chronic Coumadin initially placed on hold and has been resumed no need for bridging except patient would remain on aspirin until INR greater than 2.00 and then discontinue.  Tolerating a regular diet.  Therapy evaluations completed due to patient decreased functional mobility was recommended for a comprehensive rehab program.      Glasgow Coma Scale Score: 15   Past Medical History      Past Medical History:  Diagnosis Date  . Arthritis      "fingers, hips, feet, ankles" (11/10/2012)  . Asthma    . Atrial fibrillation (HCC)      CHRONIC COUMADIN  . Breast cancer ([place]) 1990's    "cancer on one side; precancerous tissue on the other" (11/10/2012)  . Chronic bronchitis ([place])      "multiple times; not in a long time" (11/10/2012  . Depression    . GERD (gastroesophageal reflux disease)    . Hearing impaired    . Hypertension    . Irritable  bowel    . Lymphedema      HX OF - IN LEFT ARM--NO NEEDLES OR B/P'S LEFT ARM  . Macular degeneration      "BEGINNINGS OF" MACULAR DEGENERATION  . Osteoporosis    . Pneumonia      "multiple times; not in a long time" (11/10/2012)  . Recurrent UTI    . Sleep apnea      "dx'd w/it; don't wear mask or anything" (11/10/2012)  . Stroke ([place]) ~ 2005    HX OF TIA-NO RESIDUAL PROBLEM--PARALYSIS RT SIDE FACE /PT'S MOUTH DROOPS-AND LOSS OF HEARING RT EAR --SINCE EAR SURGERY AS A CHILD   . UTI (lower urinary tract infection)      FREQUENT      Family History  family history includes CAD in her father and mother; Stroke in her mother.   Prior Rehab/Hospitalizations:  Has the patient had prior rehab or hospitalizations prior to admission? Yes   Has the patient had major surgery during 100 days prior to admission? Yes   Current Medications    Current Facility-Administered Medications:  .  0.9 %  sodium chloride infusion (Manually program via Guardrails IV Fluids), , Intravenous, Once, Regalado, Belkys A, MD, Held at 04/28/20 1127 .  acetaminophen (TYLENOL) tablet 650 mg, 650 mg, Oral, Q6H PRN, 650 mg at 04/29/20 [id number] **OR** acetaminophen (TYLENOL) suppository 650 mg, 650 mg, Rectal, Q6H PRN, Patrecia Pace A, PA-C .  aspirin tablet 325 mg, 325 mg, Oral, Daily, Regalado, Belkys A, MD .  atorvastatin (LIPITOR) tablet 20 mg, 20 mg, Oral, QPC supper, Patrecia Pace A, PA-C, 20 mg at 04/28/20 1825 .  cholecalciferol (VITAMIN D3) tablet 1,000 Units, 1,000 Units, Oral, Daily, Delray Alt, PA-C, 1,000 Units at 04/28/20 0846 .  docusate sodium (COLACE) capsule 100 mg, 100 mg, Oral, BID, Delray Alt, PA-C, 100 mg at 04/28/20 2026 .  hydrALAZINE (APRESOLINE) injection 10 mg, 10 mg, Intravenous, Q6H PRN, Regalado,  Belkys A, MD, 10 mg at 04/27/20 0607 .  methocarbamol (ROBAXIN) tablet 500 mg, 500 mg, Oral, Q6H PRN, 500 mg at 04/28/20 0424 **OR** [DISCONTINUED] methocarbamol (ROBAXIN) 500 mg in dextrose 5  % 50 mL IVPB, 500 mg, Intravenous, Q6H PRN, Yacobi, Sarah A, PA-C .  metoprolol succinate (TOPROL-XL) 24 hr tablet 25 mg, 25 mg, Oral, Daily, Patrecia Pace A, PA-C, 25 mg at 04/28/20 0846 .  naloxone [facility]) injection 0.4 mg, 0.4 mg, Intravenous, PRN, Patrecia Pace A, PA-C .  nystatin (MYCOSTATIN) 100000 UNIT/ML suspension 500,000 Units, 5 mL, Oral, QID, Regalado, Belkys A, MD, 500,000 Units at 04/28/20 2027 .  ondansetron (ZOFRAN) tablet 4 mg, 4 mg, Oral, Q6H PRN **OR** ondansetron (ZOFRAN) injection 4 mg, 4 mg, Intravenous, Q6H PRN, Ricci Barker, Sarah A, PA-C .  oxyCODONE-acetaminophen (PERCOCET/ROXICET) 5-325 MG per tablet 1 tablet, 1 tablet, Oral, Q4H PRN, Delray Alt, PA-C, 1 tablet at 04/26/20 [phone] .  pantoprazole (PROTONIX) EC tablet 40 mg, 40 mg, Oral, Daily, Regalado, Belkys A, MD .  phenol (CHLORASEPTIC) mouth spray 1 spray, 1 spray, Mouth/Throat, PRN, Regalado, Belkys A, MD, 1 spray at 04/26/20 1422 .  polyethylene glycol (MIRALAX / GLYCOLAX) packet 17 g, 17 g, Oral, Daily PRN, Ricci Barker, Sarah A, PA-C .  vitamin B-12 (CYANOCOBALAMIN) tablet 1,000 mcg, 1,000 mcg, Oral, Daily, Bhagat, Srishti L, MD, 1,000 mcg at 04/28/20 0846 .  Vitamin D (Ergocalciferol) (DRISDOL) capsule 50,000 Units, 50,000 Units, Oral, Q7 days, Regalado, Belkys A, MD, 50,000 Units at 04/26/20 0923 .  warfarin (COUMADIN) tablet 5 mg, 5 mg, Oral, ONCE-1600, Henri Medal, RPH .  Warfarin - Pharmacist Dosing Inpatient, , Does not apply, q1600, Regalado, Belkys A, MD   Patients Current Diet:     Diet Order                      Diet regular Room service appropriate? Yes; Fluid consistency: Thin  Diet effective now                      Precautions / Restrictions Precautions Precautions: Fall Restrictions Weight Bearing Restrictions: Yes LLE Weight Bearing: Weight bearing as tolerated    Has the patient had 2 or more falls or a fall with injury in the past year?Yes   Prior Activity Level Community (5-7x/wk):  Pt. was active in the community PTA   Prior Functional Level Prior Function Level of Independence: Needs assistance Gait / Transfers Assistance Needed: ambulates with RW at baseline ADL's / Homemaking Assistance Needed: requires assistance for LB dressing and IADL   Self Care: Did the patient need help bathing, dressing, using the toilet or eating?  Independent   Indoor Mobility: Did the patient need assistance with walking from room to room (with or without device)? Independent   Stairs: Did the patient need assistance with internal or external stairs (with or without device)? Needed some help   Functional Cognition: Did the patient need help planning regular tasks such as shopping or remembering to take medications? [place] / [place] Devices/Equipment: Shower chair with back,Eyeglasses,Hearing aid,Wheelchair Home Equipment: Walker - 2 wheels,Bedside commode,Shower seat,Toilet riser,Hand held shower head,Wheelchair - manual,Other (comment) (lift chair)   Prior Device Use: Indicate devices/aids used by the patient prior to current illness, exacerbation or injury? Walker   Current Functional Level Cognition   Overall Cognitive Status: Difficult to assess Difficult to assess due to: Hard of hearing/deaf Current Attention Level: Selective Orientation  Level: Oriented X4 Following Commands: Follows one step commands with increased time General Comments: Much improved compared to last session    Extremity Assessment (includes Sensation/Coordination)   Upper Extremity Assessment: Generalized weakness  Lower Extremity Assessment: Defer to PT evaluation LLE Deficits / Details: grossly 2/5. Patient very stiff with little knee flexion with movement LLE: Unable to fully assess due to pain     ADLs   Overall ADL's : Needs assistance/impaired Eating/Feeding: Minimal assistance,Bed level Eating/Feeding Details (indicate cue type and reason):  assist to open containers, set up tray Grooming: Oral care,Brushing hair,Minimal assistance,Sitting Toileting- Clothing Manipulation and Hygiene: Total assistance,+2 for physical assistance,Bed level General ADL Comments: pt able to participate maximally in therapy session this visit, works hard, but fatigues     Mobility   Overal bed mobility: Needs Assistance Bed Mobility: Rolling,Sidelying to Sit Rolling: Max assist Sidelying to sit: Max assist,+2 for safety/equipment Supine to sit: Total assist,+2 for physical assistance,+2 for safety/equipment Sit to supine: Total assist,+2 for physical assistance,+2 for safety/equipment General bed mobility comments: Noatble increased initiation and paticipation in mobility tasks, assist for LEs over EOB and to raise trunk, assist for hips to EOB using bed pad     Transfers   Overall transfer level: Needs assistance Equipment used: Rolling walker (2 wheeled),Ambulation equipment used Transfer via Lift Equipment: Stedy Transfers: Sit to/from [employer] to Stand: +2 physical assistance,Max assist Stand pivot transfers: +2 physical assistance,Max assist General transfer comment: use of pad under hips and gait belt to assist hips up, cues for hand placement with RW, posterior bias requring +2 max assist to correct in standing, stedying assist as pt pivoted to chair without ability to pick up L foot     Ambulation / Gait / Stairs / Wheelchair Mobility         Posture / Balance Dynamic Sitting Balance Sitting balance - Comments: initially requiring minA-min guard to maintain sitting balance, progressing to close supervision Balance Overall balance assessment: Needs assistance Sitting-balance support: Bilateral upper extremity supported,Feet supported Sitting balance-Leahy Scale: Fair Sitting balance - Comments: initially requiring minA-min guard to maintain sitting balance, progressing to close supervision Standing balance  support: Bilateral upper extremity supported,During functional activity Standing balance-Leahy Scale: Poor Standing balance comment: able to stand in sara stedy from flaps with min guard assist x 2     Special needs/care consideration Skin Surgical incision to L hip; Ecchymosis on R flank and Designated visitor [name] (husband)        Previous Environmental health practitioner (from acute therapy documentation) Living Arrangements: Spouse/significant other  Lives With: Spouse Available Help at Discharge: Family,Available 24 hours/day Type of Home: House Home Layout: Two level,Able to live on main level with bedroom/bathroom Alternate Level Stairs-Rails: Right Alternate Level Stairs-Number of Steps: full flight Home Access: Ramped entrance Bathroom Shower/Tub: Multimedia programmer: Handicapped height Bathroom Accessibility: Yes How Accessible: Accessible via walker Cascade: Yes Type of Home Care Services: Home PT   Discharge Living Setting Plans for Discharge Living Setting: Patient's home Type of Home at Discharge: House Discharge Home Layout: Two level,Able to live on main level with bedroom/bathroom Alternate Level Stairs-Rails: Right Alternate Level Stairs-Number of Steps: full flight Discharge Home Access: Ramped entrance Discharge Bathroom Shower/Tub: Walk-in shower Discharge Bathroom Toilet: Handicapped height Discharge Bathroom Accessibility: Yes How Accessible: Accessible via walker Does the patient have any problems obtaining your medications?: No   Social/Family/Support Systems Patient Roles: Spouse Contact Information: [phone] Anticipated Caregiver: [name] Anticipated  Caregiver's Contact Information: [phone] Ability/Limitations of Caregiver: Can provide min-mod A Caregiver Availability: 24/7 Discharge Plan Discussed with Primary Caregiver: Yes Is Caregiver In Agreement with Plan?: Yes Does Caregiver/Family have Issues with  Lodging/Transportation while Pt is in Rehab?: No     Goals Patient/Family Goal for Rehab: PT/OT supervision to min assist, SLP supervision Expected length of stay: 18-21 days Pt/Family Agrees to Admission and willing to participate: Yes Program Orientation Provided & Reviewed with Pt/Caregiver Including Roles  & Responsibilities: Yes     Decrease burden of Care through IP rehab admission: n/a   Possible need for SNF placement upon discharge: Potentially, pt with new stroke in addition to L hip fracture.    Patient Condition: This patient's condition remains as documented in the consult dated 04/28/20, in which the Rehabilitation Physician determined and documented that the patient's condition is appropriate for intensive rehabilitative care in an inpatient rehabilitation facility. Will admit to inpatient rehab today.   Preadmission Screen Completed By:  Michel Santee, PT, DPT 04/29/2020 10:20 AM ______________________________________________________________________   Discussed status with Dr. Dagoberto Ligas on 04/29/20 at 10:20 AM  and received approval for admission today.   Admission Coordinator:  Michel Santee, PT, DPT time 10:20 AM Sudie Grumbling 04/29/20          Cosigned by: Courtney Heys, MD at 04/29/2020 10:59 AM

## 2020-04-29 NOTE — H&P (Signed)
Physical Medicine and Rehabilitation Admission H&P    No chief complaint on file. : HPI: [name] is an 87 year old right-handed female with history of atrial fibrillation on chronic Coumadin, CKD stage III with baseline 1.46, breast cancer with bilateral mastectomy 1990s, hearing impaired, hypertension, TIA without residual weakness, recurrent UTI, hyperlipidemia, bilateral total knee replacements as well as left femur fracture May 2019 with ORIF,.  Per chart review patient lives with spouse.  1 level home with ramped entrance.  Ambulates with a rolling walker.  Requires some assistance for lower body ADLs.  Presented 04/22/2020 after a fall without loss of consciousness landing on her left hip.  She stated she leaned too far forward with falling out of her wheelchair.  Admission chemistries hemoglobin 10.1 WBC 11,700, BUN 30, creatinine 1.46, urinalysis negative nitrite, INR 2.3.  X-rays and imaging revealed left intertrochanteric femur fracture as well as healed distal femur fracture.  Patient did receive vitamin K to reverse Coumadin.  Underwent nailing of left intertrochanteric femur fracture removal of hardware left femur 04/23/2020 per Dr. Doreatha Martin.  Weightbearing as tolerated left lower extremity.  Postoperative chronic Coumadin resumed.  Acute blood loss anemia 6.8 she did receive 1 unit packed red blood cells latest hemoglobin 8.0..  Creatinine remained stable 1.75 from baseline 1.46.  On 04/27/2020 patient with altered mental status reportedly having difficulty swallowing.  MRI of the brain showed small acute infarct in the right cerebellum bilaterally occipital parietal lobes and high left precentral gyrus.  Patient did not receive TPA.  MRA of the head and neck showed no large vessel occlusion.  Echocardiogram showed ejection fraction of 70 to 75% no regional wall motion abnormalities.  Patient's chronic Coumadin initially placed on hold and has been resumed no need for bridging except patient  would remain on aspirin until INR greater than 2.00 and then discontinue.  Tolerating a regular diet.  Therapy evaluations completed due to patient decreased functional ability was admitted for a comprehensive rehab program.  Pt reports very few symptoms from CVA's- she was confused initially- says her vision and hearing already poor, so don't know if has visual issues.  She was having swallowing issues, but that and "thinking" have improved to baseline.   Having pain in L shoulder (which is new- since in hospital), L hip pain since fx, and breathing sometimes hurts with a deep breath..  Was walking at home with cane or RW at home prior to admission.   Is very hard of hearing- cannot hear at all on R_ can hear a little on L with hearing aid.  Has diarrhea chronically- uses imodium 2 mg BID at home- worked well.     Review of Systems  Constitutional: Negative for chills and fever.  HENT: Positive for hearing loss.   Eyes: Negative for blurred vision and double vision.  Respiratory: Negative for cough and shortness of breath.   Cardiovascular: Positive for palpitations and leg swelling. Negative for chest pain.  Gastrointestinal: Positive for diarrhea. Negative for constipation, heartburn and nausea.       GERD  Genitourinary: Negative for dysuria, flank pain and hematuria.  Musculoskeletal: Positive for joint pain and myalgias.  Skin: Negative for rash.  Psychiatric/Behavioral: Positive for depression.  All other systems reviewed and are negative.  Past Medical History:  Diagnosis Date  . Arthritis    "fingers, hips, feet, ankles" (11/10/2012)  . Asthma   . Atrial fibrillation (HCC)    CHRONIC COUMADIN  . Breast cancer ([facility]) 1990's   "  cancer on one side; precancerous tissue on the other" (11/10/2012)  . Chronic bronchitis ([facility])    "multiple times; not in a long time" (11/10/2012  . Depression   . GERD (gastroesophageal reflux disease)   . Hearing impaired   . Hypertension   .  Irritable bowel   . Lymphedema    HX OF - IN LEFT ARM--NO NEEDLES OR B/P'S LEFT ARM  . Macular degeneration    "BEGINNINGS OF" MACULAR DEGENERATION  . Osteoporosis   . Pneumonia    "multiple times; not in a long time" (11/10/2012)  . Recurrent UTI   . Sleep apnea    "dx'd w/it; don't wear mask or anything" (11/10/2012)  . Stroke ([facility]) ~ 2005   HX OF TIA-NO RESIDUAL PROBLEM--PARALYSIS RT SIDE FACE /PT'S MOUTH DROOPS-AND LOSS OF HEARING RT EAR --SINCE EAR SURGERY AS A CHILD   . UTI (lower urinary tract infection)    FREQUENT   Past Surgical History:  Procedure Laterality Date  . BREAST BIOPSY Bilateral 1990's  . Dacryocystorhinostomy  02/18/2016  . INNER EAR SURGERY Right    MULTIPLE EAR SURGERIES,  . INTRAMEDULLARY (IM) NAIL INTERTROCHANTERIC Left 04/23/2020   Procedure: INTRAMEDULLARY (IM) NAIL INTERTROCHANTRIC;  Surgeon: Shona Needles, MD;  Location: [facility];  Service: Orthopedics;  Laterality: Left;  . JOINT REPLACEMENT    . KNEE ARTHROSCOPY  04/07/2012   Procedure: ARTHROSCOPY KNEE;  Surgeon: Gearlean Alf, MD;  Location: [facility];  Service: Orthopedics;  Laterality: Left;  WITH SYNOVECTOMY  . MASTECTOMY Bilateral 1990's  . MASTOIDECTOMY Right 1942  . ORIF FEMUR FRACTURE Left 08/08/2017   Procedure: OPEN REDUCTION INTERNAL FIXATION (ORIF) DISTAL FEMUR FRACTURE;  Surgeon: Shona Needles, MD;  Location: [facility];  Service: Orthopedics;  Laterality: Left;  . TONSILLECTOMY    . TOTAL KNEE ARTHROPLASTY  09/20/2011   LEFT TOTAL KNEE ARTHROPLASTY;  Surgeon: Gearlean Alf, MD;  Location: [facility];  Service: Orthopedics;  Laterality: Left;  . TOTAL KNEE ARTHROPLASTY  2006   Family History  Problem Relation Age of Onset  . CAD Mother   . Stroke Mother   . CAD Father    Social History:  reports that she has never smoked. She has never used smokeless tobacco. She reports that she does not drink alcohol and does not use drugs. Allergies:  Allergies  Allergen Reactions   . Pradaxa [Dabigatran Etexilate Mesylate] Other (See Comments)    INTERNAL BLEEDING  . Clindamycin/Lincomycin Other (See Comments)    PT STATES HER DOCTOR TOLD HER NOT TO TAKE CLINDAMYCIN BECAUSE SHE GOT C-DIFF AFTER TAKING AMPICILLIN- "tolerated in 2022" (per spouse)  . Latex Other (See Comments)    Blisters   . Ampicillin Other (See Comments)    C-DIF AFTER TAKING AMPICILLIN- "tolerated in 2022" (per husband) Has patient had a PCN reaction causing immediate rash, facial/tongue/throat swelling, SOB or lightheadedness with hypotension: No Has patient had a PCN reaction causing severe rash involving mucus membranes or skin necrosis: No Has patient had a PCN reaction that required hospitalization: No Has patient had a PCN reaction occurring within the last 10 years: No If all of the above answers are "NO", then may proceed with Cephalosporin u  . Sulfamethoxazole Diarrhea   Medications Prior to Admission  Medication Sig Dispense Refill  . amLODipine (NORVASC) 2.5 MG tablet Take 1 tablet (2.5 mg total) by mouth daily. 30 tablet 0  . aspirin 325 MG tablet Take 1 tablet (325 mg total) by mouth  daily. 5 tablet 0  . atorvastatin (LIPITOR) 20 MG tablet Take 20 mg by mouth daily after supper.     . cholecalciferol (VITAMIN D) 25 MCG tablet Take 1 tablet (1,000 Units total) by mouth daily. 30 tablet 0  . docusate sodium (COLACE) 100 MG capsule Take 1 capsule (100 mg total) by mouth 2 (two) times daily. 10 capsule 0  . estradiol (ESTRACE) 0.1 MG/GM vaginal cream Place 1 Applicatorful vaginally 2 (two) times a week.    . loperamide (IMODIUM) 2 MG capsule Take 2-4 mg by mouth 5 (five) times daily as needed for diarrhea or loose stools.     . metoprolol succinate (TOPROL-XL) 25 MG 24 hr tablet Take 1 tablet (25 mg total) by mouth daily. 30 tablet 11  . Multiple Vitamins-Minerals (ONE-A-DAY PROACTIVE 65+) TABS Take 1 tablet by mouth daily with breakfast.    . Multiple Vitamins-Minerals (PRESERVISION  AREDS) CAPS Take 1 capsule by mouth 2 (two) times daily.    Marland Kitchen nystatin (MYCOSTATIN) 100000 UNIT/ML suspension Take 5 mLs (500,000 Units total) by mouth 4 (four) times daily. 60 mL 0  . Polyethyl Glyc-Propyl Glyc PF (SYSTANE ULTRA PF) 0.4-0.3 % SOLN Place 1 drop into both eyes at bedtime.    . Probiotic Product (VSL#3 PO) Take 1 capsule by mouth in the morning.    . vitamin B-12 1000 MCG tablet Take 1 tablet (1,000 mcg total) by mouth daily. 30 tablet 0  . warfarin (COUMADIN) 5 MG tablet Take 5-7.5 mg by mouth See admin instructions. Take 5 mg by mouth with supper (evening meal) on Sun/Mon/Wed/Fri/Sat and 7.5 mg on Tues/Thurs      Drug Regimen Review Drug regimen was reviewed and remains appropriate with no significant issues identified  Home:     Functional History:    Functional Status:  Mobility:          ADL:    Cognition:      Physical Exam: There were no vitals taken for this visit. Physical Exam Vitals and nursing note reviewed. Exam conducted with a chaperone present.  Constitutional:      Comments: Pt sitting up at bedside chair, husband sitting nearby, pt VERY HOH- can hear a little from L ear, appropriate, NAD; on ipad  HENT:     Head: Normocephalic and atraumatic.     Comments: Smile appeared equal; tongue midline    Right Ear: External ear normal.     Left Ear: External ear normal.     Ears:     Comments: Wearing L hearing aid- deaf on R    Nose: Nose normal. No congestion.     Mouth/Throat:     Mouth: Mucous membranes are dry.     Pharynx: Oropharynx is clear. No oropharyngeal exudate.  Eyes:     General:        Right eye: No discharge.        Left eye: No discharge.     Extraocular Movements: Extraocular movements intact.  Cardiovascular:     Comments: In afib, rate controlled Pulmonary:     Comments: CTA B/L- no W/R/R- good air movement Abdominal:     Comments: Soft, NT, ND, (+)BS  - slightly hyperactive  Musculoskeletal:     Cervical back:  Normal range of motion. No rigidity.     Comments: UEs- 4+/5 in B/L UEs in biceps, triceps, grip and finger abd, however VERY TTP over L bicipital tendon- c/w tendinitis LEs- HF 2/5 on R; 2-/5 on L due  to pain; KE 3/5 B/L; DF and PF 5-/5 B/L L leg is shorter than R leg- evident when in chair  Skin:    General: Skin is warm and dry.     Comments: Left hip incision is dressed appropriately tender. Very bruised- glued- purple/yellow bruising  Neurological:     Comments: Patient is alert in no acute distress oriented x3 and follows commands.  Pt very hard of hearing Ox3  Psychiatric:        Mood and Affect: Mood normal.        Behavior: Behavior normal.     Results for orders placed or performed during the hospital encounter of 04/22/20 (from the past 48 hour(s))  Protime-INR     Status: Abnormal   Collection Time: 04/28/20  2:20 AM  Result Value Ref Range   Prothrombin Time 18.2 (H) 11.4 - 15.2 seconds   INR 1.6 (H) 0.8 - 1.2    Comment: (NOTE) INR goal varies based on device and disease states. Performed at [facility], [place] [address] [place], [place] [address]   Hemoglobin A1c     Status: None   Collection Time: 04/28/20  2:20 AM  Result Value Ref Range   Hgb A1c MFr Bld 5.6 4.8 - 5.6 %    Comment: (NOTE) Pre diabetes:          5.7%-6.4%  Diabetes:              >6.4%  Glycemic control for   <7.0% adults with diabetes    Mean Plasma Glucose 114.02 mg/dL    Comment: Performed at [facility] [address] [place], [place] [address]  Lipid panel     Status: Abnormal   Collection Time: 04/28/20  2:20 AM  Result Value Ref Range   Cholesterol 100 0 - 200 mg/dL   Triglycerides 129 <150 mg/dL   HDL 36 (L) >40 mg/dL   Total CHOL/HDL Ratio 2.8 RATIO   VLDL 26 0 - 40 mg/dL   LDL Cholesterol 38 0 - 99 mg/dL    Comment:        Total Cholesterol/HDL:CHD Risk Coronary Heart Disease Risk Table                     Men   Women  1/2 Average Risk   3.4   3.3   Average Risk       5.0   4.4  2 X Average Risk   9.6   7.1  3 X Average Risk  23.4   11.0        Use the calculated Patient Ratio above and the CHD Risk Table to determine the patient's CHD Risk.        ATP III CLASSIFICATION (LDL):  <100     mg/dL   Optimal  100-129  mg/dL   Near or Above                    Optimal  130-159  mg/dL   Borderline  160-189  mg/dL   High  >190     mg/dL   Very High Performed at [facility] [address] [place] [address]   CBC     Status: Abnormal   Collection Time: 04/28/20  2:20 AM  Result Value Ref Range   WBC 7.1 4.0 - 10.5 K/uL   RBC 2.42 (L) 3.87 - 5.11 MIL/uL   Hemoglobin 6.8 (LL) 12.0 -  15.0 g/dL    Comment: REPEATED TO VERIFY THIS CRITICAL RESULT HAS VERIFIED AND BEEN CALLED TO P.FULLER,RN BY BONNIE DAVIS ON 01 31 2022 AT 1004, AND HAS BEEN READ BACK.     HCT 21.8 (L) 36.0 - 46.0 %   MCV 90.1 80.0 - 100.0 fL   MCH 28.1 26.0 - 34.0 pg   MCHC 31.2 30.0 - 36.0 g/dL   RDW 14.2 11.5 - 15.5 %   Platelets 252 150 - 400 K/uL   nRBC 0.0 0.0 - 0.2 %    Comment: Performed at [facility] [address], [place], [place] [address]  Basic metabolic panel     Status: Abnormal   Collection Time: 04/28/20  2:20 AM  Result Value Ref Range   Sodium 139 135 - 145 mmol/L   Potassium 3.7 3.5 - 5.1 mmol/L   Chloride 110 98 - 111 mmol/L   CO2 20 (L) 22 - 32 mmol/L   Glucose, Bld 106 (H) 70 - 99 mg/dL    Comment: Glucose reference range applies only to samples taken after fasting for at least 8 hours.   BUN 25 (H) 8 - 23 mg/dL   Creatinine, Ser 1.26 (H) 0.44 - 1.00 mg/dL   Calcium 8.0 (L) 8.9 - 10.3 mg/dL   GFR, Estimated 42 (L) >60 mL/min    Comment: (NOTE) Calculated using the CKD-EPI Creatinine Equation (2021)    Anion gap 9 5 - 15    Comment: Performed at [facility] [address] [place], [place] [address]  Prepare RBC (crossmatch)     Status: None   Collection Time: 04/28/20 11:32 AM  Result Value Ref Range    Order Confirmation      ORDER PROCESSED BY BLOOD BANK Performed at [facility], [place] [address] [place], [place] [address]   Type and screen [place]     Status: None   Collection Time: 04/28/20 11:38 AM  Result Value Ref Range   ABO/RH(D) A POS    Antibody Screen NEG    Sample Expiration 05/01/2020,2359    Unit Number [id number]    Blood Component Type RBC LR PHER1    Unit division 00    Status of Unit ISSUED,FINAL    Transfusion Status OK TO TRANSFUSE    Crossmatch Result      Compatible Performed at [facility], [place] [address], [place], [place] [address]   Hemoglobin and hematocrit, blood     Status: Abnormal   Collection Time: 04/28/20  7:59 PM  Result Value Ref Range   Hemoglobin 8.7 (L) 12.0 - 15.0 g/dL    Comment: REPEATED TO VERIFY POST TRANSFUSION SPECIMEN    HCT 27.4 (L) 36.0 - 46.0 %    Comment: REPEATED TO VERIFY Performed at [facility] [address] [place], [place] [address]   Protime-INR     Status: Abnormal   Collection Time: 04/29/20  1:06 AM  Result Value Ref Range   Prothrombin Time 18.2 (H) 11.4 - 15.2 seconds   INR 1.6 (H) 0.8 - 1.2    Comment: (NOTE) INR goal varies based on device and disease states. Performed at [facility], [place] [address] [place] [address]   CBC     Status: Abnormal   Collection Time: 04/29/20  1:06 AM  Result Value Ref Range   WBC 7.3 4.0 - 10.5 K/uL   RBC 2.81 (L) 3.87 -  5.11 MIL/uL   Hemoglobin 8.0 (L) 12.0 - 15.0 g/dL   HCT 25.1 (L) 36.0 - 46.0 %   MCV 89.3 80.0 - 100.0 fL   MCH 28.5 26.0 - 34.0 pg   MCHC 31.9 30.0 - 36.0 g/dL   RDW 14.0 11.5 - 15.5 %   Platelets 269 150 - 400 K/uL   nRBC 0.0 0.0 - 0.2 %    Comment: Performed at [facility] [address] [place], [place] [address]   MR ANGIO HEAD WO CONTRAST  Result Date: 04/27/2020 CLINICAL DATA:  Stroke follow-up EXAM: MRA HEAD WITHOUT CONTRAST TECHNIQUE: Angiographic images of the  Circle of Willis were obtained using MRA technique without intravenous contrast. COMPARISON:  MRI head 04/27/2020 FINDINGS: Suboptimal image quality due to patient motion. In addition, there is venous contrast present. I discussed with the technologist from the study who insisted no contrast was injected prior to the MRA of the head Left vertebral artery dominant and supplies the basilar. Small right vertebral artery patent to the basilar. Basilar is irregular but patent. Posterior cerebral arteries are patent bilaterally with mild atherosclerotic irregularity. Internal carotid artery patent through the cavernous segment. There is considerable cavernous sinus contrast obscuring the cavernous carotid. Anterior and middle cerebral arteries patent bilaterally. Moderate stenosis in the anterior and middle cerebral arteries bilaterally. No large vessel occlusion or aneurysm. IMPRESSION: Suboptimal study due to venous contrast and patient motion Moderate intracranial atherosclerotic disease. No large vessel occlusion. Electronically Signed   By: Franchot Gallo M.D.   On: 04/27/2020 17:00   MR ANGIO NECK W WO CONTRAST  Result Date: 04/27/2020 CLINICAL DATA:  Stroke EXAM: MRA NECK WITHOUT AND WITH CONTRAST TECHNIQUE: Multiplanar and multiecho pulse sequences of the neck were obtained without and with intravenous contrast. Angiographic images of the neck were obtained using MRA technique without and with intravenous contrast. CONTRAST:  8.33mL GADAVIST GADOBUTROL 1 MMOL/ML IV SOLN COMPARISON:  None. FINDINGS: Suboptimal study due to extensive venous contamination. During contrast infusion, the MRI equipment did not automatically begin scanning during the arterial phase. Scanning was started manually during the venous phase. In addition, there is motion on the study degrading image quality. Carotid artery is patent bilaterally. Both vertebral arteries are patent. IMPRESSION: Limited study due to venous phase scanning and  patient motion. No large vessel occlusion in the neck. Electronically Signed   By: Franchot Gallo M.D.   On: 04/27/2020 17:03       Medical Problem List and Plan: 1.  Decreased functional ability secondary to left intertrochanteric femur fracture as well as history of left femur fracture May 2019.  Status post IM nailing as well as removal of hardware left femur from previous fracture 04/23/2020.  Weightbearing as tolerated.  Complicated by postoperative small acute infarct right cerebellum, bilateral occipital parietal lobes in the high left precentral gyrus.  -patient may  Shower- if cover L hip  -ELOS/Goals: 18-21 days min A 2.  Antithrombotics: -DVT/anticoagulation: Chronic Coumadin  -antiplatelet therapy: Continue aspirin 325 mg daily until INR greater than 2.00 and then discontinue. 3. Pain Management: Oxycodone as needed, Robaxin as needed 4. Mood: Provide emotional support  -antipsychotic agents: N/A 5. Neuropsych: This patient is capable of making decisions on her own behalf. 6.  Acute blood loss anemia.  Follow-up CBC 7.  Atrial fibrillation.  Toprol-XL 25 mg daily.  Continue Coumadin.  Cardiac rate controlled 8.  Permissive hypertension.  Chlorthalidone losartan currently on hold.  Continue Toprol 9.  CKD  stage III.  Baseline creatinine 1.46.  Follow-up chemistries 10.  Hyperlipidemia.  Lipitor 11..  Recurrent UTIs.  Latest urinalysis study negative.  Denies dysuria hematuria 12. Skin/Wound Care: Routine skin checks 13. Fluids/Electrolytes/Nutrition: Routine in and outs with follow-up chemistries 14. Very hard of hearing- talk to her LEFT ear.  15. Chronic diarrhea- used Imodium 2 mg BID daily at home-  16. Leg length discrepancy- L shorter than Right- husband to bring in appropriate lift shoes.  17. L shoulder bicipital tendinitis based on clinical exam- suggest voltaren gel 2G QID and if doesn't work, try steroid injection if possible. Really acute pain with LUE  ROM    Lavon Paganini Anguilli- PA-C 04/29/2020   I have personally performed a face to face diagnostic evaluation of this patient and formulated the key components of the plan.  Additionally, I have personally reviewed laboratory data, imaging studies, as well as relevant notes and concur with the physician assistant's documentation above.   The patient's status has not changed from the original H&P.  Any changes in documentation from the acute care chart have been noted above.     Courtney Heys, MD 04/29/2020

## 2020-04-29 NOTE — Progress Notes (Signed)
Pt admitted to room [phone]. Denies pain or discomfort at this time. Reviewed rehab fall prevention policy and visitor policy with pt and pt's spouse. No further questions at this time.   Gerald Stabs, RN

## 2020-04-30 DIAGNOSIS — I6310 Cerebral infarction due to embolism of unspecified precerebral artery: Secondary | ICD-10-CM | POA: Diagnosis not present

## 2020-04-30 DIAGNOSIS — N183 Chronic kidney disease, stage 3 unspecified: Secondary | ICD-10-CM

## 2020-04-30 DIAGNOSIS — S72145D Nondisplaced intertrochanteric fracture of left femur, subsequent encounter for closed fracture with routine healing: Secondary | ICD-10-CM | POA: Diagnosis not present

## 2020-04-30 DIAGNOSIS — I10 Essential (primary) hypertension: Secondary | ICD-10-CM

## 2020-04-30 DIAGNOSIS — R0981 Nasal congestion: Secondary | ICD-10-CM

## 2020-04-30 DIAGNOSIS — D62 Acute posthemorrhagic anemia: Secondary | ICD-10-CM

## 2020-04-30 LAB — CBC WITH DIFFERENTIAL/PLATELET
Abs Immature Granulocytes: 0.11 10*3/uL — ABNORMAL HIGH (ref 0.00–0.07)
Basophils Absolute: 0 10*3/uL (ref 0.0–0.1)
Basophils Relative: 0 %
Eosinophils Absolute: 0.3 10*3/uL (ref 0.0–0.5)
Eosinophils Relative: 3 %
HCT: 25.4 % — ABNORMAL LOW (ref 36.0–46.0)
Hemoglobin: 8.4 g/dL — ABNORMAL LOW (ref 12.0–15.0)
Immature Granulocytes: 1 %
Lymphocytes Relative: 15 %
Lymphs Abs: 1.4 10*3/uL (ref 0.7–4.0)
MCH: 29.4 pg (ref 26.0–34.0)
MCHC: 33.1 g/dL (ref 30.0–36.0)
MCV: 88.8 fL (ref 80.0–100.0)
Monocytes Absolute: 0.8 10*3/uL (ref 0.1–1.0)
Monocytes Relative: 9 %
Neutro Abs: 6.6 10*3/uL (ref 1.7–7.7)
Neutrophils Relative %: 72 %
Platelets: 308 10*3/uL (ref 150–400)
RBC: 2.86 MIL/uL — ABNORMAL LOW (ref 3.87–5.11)
RDW: 14.4 % (ref 11.5–15.5)
WBC: 9.3 10*3/uL (ref 4.0–10.5)
nRBC: 0 % (ref 0.0–0.2)

## 2020-04-30 LAB — COMPREHENSIVE METABOLIC PANEL
ALT: 20 U/L (ref 0–44)
AST: 23 U/L (ref 15–41)
Albumin: 2.7 g/dL — ABNORMAL LOW (ref 3.5–5.0)
Alkaline Phosphatase: 62 U/L (ref 38–126)
Anion gap: 12 (ref 5–15)
BUN: 25 mg/dL — ABNORMAL HIGH (ref 8–23)
CO2: 20 mmol/L — ABNORMAL LOW (ref 22–32)
Calcium: 8.4 mg/dL — ABNORMAL LOW (ref 8.9–10.3)
Chloride: 107 mmol/L (ref 98–111)
Creatinine, Ser: 1.14 mg/dL — ABNORMAL HIGH (ref 0.44–1.00)
GFR, Estimated: 47 mL/min — ABNORMAL LOW (ref >60)
Glucose, Bld: 123 mg/dL — ABNORMAL HIGH (ref 70–99)
Potassium: 3.8 mmol/L (ref 3.5–5.1)
Sodium: 139 mmol/L (ref 135–145)
Total Bilirubin: 1 mg/dL (ref 0.3–1.2)
Total Protein: 5.6 g/dL — ABNORMAL LOW (ref 6.5–8.1)

## 2020-04-30 LAB — PROTIME-INR
INR: 1.8 — ABNORMAL HIGH (ref 0.8–1.2)
Prothrombin Time: 20 seconds — ABNORMAL HIGH (ref 11.4–15.2)

## 2020-04-30 MED ORDER — WARFARIN SODIUM 5 MG PO TABS
5.0000 mg | ORAL_TABLET | Freq: Once | ORAL | Status: AC
  Administered 2020-04-30: 5 mg via ORAL
  Filled 2020-04-30: qty 1

## 2020-04-30 NOTE — Evaluation (Signed)
Speech Language Pathology Assessment and Plan  Patient Details  Name: [name] MRN: [id number] Date of Birth: 1933-06-11  SLP Diagnosis: Cognitive Impairments;Speech and Language deficits  Rehab Potential: Good ELOS: 16-18 days (likely shorter for speech)    Today's Date: 04/30/2020 SLP Individual Time: 8841-6606 SLP Individual Time Calculation (min): 45 min   Hospital Problem: Principal Problem:   Intertrochanteric fracture of left hip (HCC) Active Problems:   Pressure injury of skin   Congestion of nasal sinus   Stage 3 chronic kidney disease (HCC)   Benign essential HTN  Past Medical History:  Past Medical History:  Diagnosis Date  . Arthritis    "fingers, hips, feet, ankles" (11/10/2012)  . Asthma   . Atrial fibrillation (HCC)    CHRONIC COUMADIN  . Breast cancer ([place]) 1990's   "cancer on one side; precancerous tissue on the other" (11/10/2012)  . Chronic bronchitis ([place])    "multiple times; not in a long time" (11/10/2012  . Depression   . GERD (gastroesophageal reflux disease)   . Hearing impaired   . Hypertension   . Irritable bowel   . Lymphedema    HX OF - IN LEFT ARM--NO NEEDLES OR B/P'S LEFT ARM  . Macular degeneration    "BEGINNINGS OF" MACULAR DEGENERATION  . Osteoporosis   . Pneumonia    "multiple times; not in a long time" (11/10/2012)  . Recurrent UTI   . Sleep apnea    "dx'd w/it; don't wear mask or anything" (11/10/2012)  . Stroke ([place]) ~ 2005   HX OF TIA-NO RESIDUAL PROBLEM--PARALYSIS RT SIDE FACE /PT'S MOUTH DROOPS-AND LOSS OF HEARING RT EAR --SINCE EAR SURGERY AS A CHILD   . UTI (lower urinary tract infection)    FREQUENT   Past Surgical History:  Past Surgical History:  Procedure Laterality Date  . BREAST BIOPSY Bilateral 1990's  . Dacryocystorhinostomy  02/18/2016  . INNER EAR SURGERY Right    MULTIPLE EAR SURGERIES,  . INTRAMEDULLARY (IM) NAIL INTERTROCHANTERIC Left 04/23/2020   Procedure: INTRAMEDULLARY (IM) NAIL INTERTROCHANTRIC;   Surgeon: Shona Needles, MD;  Location: [facility];  Service: Orthopedics;  Laterality: Left;  . JOINT REPLACEMENT    . KNEE ARTHROSCOPY  04/07/2012   Procedure: ARTHROSCOPY KNEE;  Surgeon: Gearlean Alf, MD;  Location: [facility];  Service: Orthopedics;  Laterality: Left;  WITH SYNOVECTOMY  . MASTECTOMY Bilateral 1990's  . MASTOIDECTOMY Right 1942  . ORIF FEMUR FRACTURE Left 08/08/2017   Procedure: OPEN REDUCTION INTERNAL FIXATION (ORIF) DISTAL FEMUR FRACTURE;  Surgeon: Shona Needles, MD;  Location: [facility];  Service: Orthopedics;  Laterality: Left;  . TONSILLECTOMY    . TOTAL KNEE ARTHROPLASTY  09/20/2011   LEFT TOTAL KNEE ARTHROPLASTY;  Surgeon: Gearlean Alf, MD;  Location: [facility];  Service: Orthopedics;  Laterality: Left;  . TOTAL KNEE ARTHROPLASTY  2006    Assessment / Plan / Recommendation  [name] is an 87 year old right-handed female with history of atrial fibrillation on chronic Coumadin, CKD stage III with baseline 1.46, breast cancer with bilateral mastectomy 1990s, hearing impaired, hypertension, TIA without residual weakness, recurrent UTI, hyperlipidemia, bilateral total knee replacements as well as left femur fracture May 2019 with ORIF,. Per chart review patient lives with spouse. 1 level home with ramped entrance. Ambulates with a rolling walker. Requires some assistance for lower body ADLs. Presented 04/22/2020 after a fall without loss of consciousness landing on her left hip. She stated she leaned too far forward with falling out of  her wheelchair. Admission chemistries hemoglobin 10.1 WBC 11,700, BUN 30, creatinine 1.46, urinalysis negative nitrite, INR 2.3. X-rays and imaging revealed left intertrochanteric femur fracture as well as healed distal femur fracture. Patient did receive vitamin K to reverse Coumadin. Underwent nailing of left intertrochanteric femur fracture removal of hardware left femur 04/23/2020 per Dr. Doreatha Martin. Weightbearing as  tolerated left lower extremity. Postoperative chronic Coumadin resumed. Acute blood loss anemia 6.8 she did receive 1 unit packed red blood cells latest hemoglobin 8.0.. Creatinine remained stable 1.75 from baseline 1.46. On 04/27/2020 patient with altered mental status reportedly having difficulty swallowing. MRI of the brain showed small acute infarct in the right cerebellum bilaterally occipital parietal lobes and high left precentral gyrus. Patient did not receive TPA. MRA of the head and neck showed no large vessel occlusion. Echocardiogram showed ejection fraction of 70 to 75% no regional wall motion abnormalities. Patient's chronic Coumadin initially placed on hold and has been resumed no need for bridging except patient would remain on aspirin until INR greater than 2.00 and then discontinue. Tolerating a regular diet. Therapy evaluations completed due to patient decreased functional ability was admitted for a comprehensive rehab program.  Clinical Impression Patient presents with a mild cognitive-linguistic impairment, however evaluation complicated by patient's poor hearing, ease of frustration (patient requesting SLP come back in morning as she did not sleep much last night). She exhibited a mild frequency of language/semantic errors in conversation and with verbal reasoning tasks. She demonstrated delayed recall of 3/4 words after 3 minutes, but had difficulty when recalling and fully describing eariler therapy sessions  (OT evaluation, PT evaluation). Swallow was not formally assessed as this was completed during her acute care stay and deemed [facility]. Patient did demonstrate some coughing (she was finishing her lunch when SLP arrived) and SLP also observed some throat clearing throughout session. Patient and husband both feel that this was due to "dry food" and that there wasn't enough moisture with her sandwich. She denied having any significant issues with reflux but has been taking protonix.  Patient showing s/s of GERD but do not suspect she has a true oropharyngeal dysphagia. Patient will benefit from skilled SLP intervention to determine cognitive-linguisitic abilities and provide further treatment if warranted.  Skilled Therapeutic Interventions          Cognitive-linguistic evaluation, portions of Cognistat  SLP Assessment  Patient will need skilled Speech Lanaguage Pathology Services during CIR admission    Recommendations  Recommendations for Other Services: Neuropsych consult Patient destination: Home Follow up Recommendations: None Equipment Recommended: None recommended by SLP    SLP Frequency 3 to 5 out of 7 days   SLP Duration  SLP Intensity  SLP Treatment/Interventions 16-18 days (likely shorter for speech)  Minumum of 1-2 x/day, 30 to 90 minutes  Cognitive remediation/compensation;Speech/Language facilitation;Patient/family education;Functional tasks;Cueing hierarchy    Pain Pain Assessment Pain Scale: 0-10 Pain Score: 0-No pain  Prior Functioning Cognitive/Linguistic Baseline: Within functional limits Type of Home: House  Lives With: Spouse Available Help at Discharge: Family;Available 24 hours/day Vocation: Retired  Programmer, systems Overall Cognitive Status: Difficult to assess Arousal/Alertness: Awake/alert Orientation Level: Oriented X4 Attention: Sustained Sustained Attention: Appears intact Memory: Impaired Memory Impairment: Retrieval deficit;Decreased recall of new information (recalled 3/4 words after 3 minute delay) Awareness: Impaired Awareness Impairment: Emergent impairment Problem Solving: Impaired Problem Solving Impairment: Verbal complex Executive Function: Reasoning Reasoning: Impaired Reasoning Impairment: Verbal complex;Verbal basic Safety/Judgment: Impaired  Comprehension Auditory Comprehension Overall Auditory Comprehension: Appears within functional limits for tasks  assessed Expression Expression Primary  Mode of Expression: Verbal Verbal Expression Overall Verbal Expression: Impaired Level of Generative/Spontaneous Verbalization: Conversation Repetition: No impairment Naming: No impairment Other Verbal Expression Comments: Patient exhbited some inconsistent responses to open ended question, such as for similarity between rose and tulip "summertime flowers" and then for next item 'bicycle and train' her answer was "summertime play things" Oral Motor Oral Motor/Sensory Function Overall Oral Motor/Sensory Function: Other (comment) (impared at baseline. Patient reports when she was 8 and 14, she had mastoidectomies and her CN VII was damaged/cut) Facial ROM: Reduced right Facial Symmetry: Abnormal symmetry right Facial Strength: Reduced right Lingual ROM: Within Functional Limits Lingual Symmetry: Within Functional Limits Lingual Strength: Within Functional Limits Motor Speech Overall Motor Speech: Appears within functional limits for tasks assessed  Care Tool Care Tool Cognition Expression of Ideas and Wants     Understanding Verbal and Non-Verbal Content     Memory/Recall Ability *first 3 days only       Short Term Goals: Week 1: SLP Short Term Goal 1 (Week 1): Patient will particpiate in completing cognitive-linguistic testing. SLP Short Term Goal 2 (Week 1): Patient will demonstrate anticipatory awareness to deficits with minA cues SLP Short Term Goal 3 (Week 1): Patient will perform ADL's/functional tasks with supervision A.  Refer to Care Plan for Long Term Goals  Recommendations for other services: Neuropsych  Discharge Criteria: Patient will be discharged from SLP if patient refuses treatment 3 consecutive times without medical reason, if treatment goals not met, if there is a change in medical status, if patient makes no progress towards goals or if patient is discharged from hospital.  The above assessment, treatment plan, treatment alternatives and goals were discussed  and mutually agreed upon: by patient and by family  Sonia Baller, MA, CCC-SLP Speech Therapy

## 2020-04-30 NOTE — Progress Notes (Signed)
Patient ID: [name], female   DOB: [birth year], 87 y.o.   MRN: [id number]  This SW covering for primary SW, Bekcy Dupree.   SW met with pt and pt in room to provide updates from team conference. Pt was sleep. SW informed pt husband d/c date 2/17. Husband aware there will continue to be updates.   Loralee Pacas, MSW, [place] Office: [phone] Cell: [phone] Fax: [phone]

## 2020-04-30 NOTE — Progress Notes (Signed)
ANTICOAGULATION CONSULT NOTE - Follow Up Consult  Pharmacy Consult for Warfarin Indication: atrial fibrillation  Allergies  Allergen Reactions  . Pradaxa [Dabigatran Etexilate Mesylate] Other (See Comments)    INTERNAL BLEEDING  . Clindamycin/Lincomycin Other (See Comments)    PT STATES HER DOCTOR TOLD HER NOT TO TAKE CLINDAMYCIN BECAUSE SHE GOT C-DIFF AFTER TAKING AMPICILLIN- "tolerated in 2022" (per spouse)  . Latex Other (See Comments)    Blisters   . Ampicillin Other (See Comments)    C-DIF AFTER TAKING AMPICILLIN- "tolerated in 2022" (per husband) Has patient had a PCN reaction causing immediate rash, facial/tongue/throat swelling, SOB or lightheadedness with hypotension: No Has patient had a PCN reaction causing severe rash involving mucus membranes or skin necrosis: No Has patient had a PCN reaction that required hospitalization: No Has patient had a PCN reaction occurring within the last 10 years: No If all of the above answers are "NO", then may proceed with Cephalosporin u  . Sulfamethoxazole Diarrhea    Patient Measurements: Height: 5\' 4"  (162.6 cm) Weight: 85.7 kg (188 lb 15 oz) IBW/kg (Calculated) : 54.7  Vital Signs: Temp: 98.9 F (37.2 C) (02/02 0513) Temp Source: Oral (02/02 0513) BP: 160/96 (02/02 0513) Pulse Rate: 86 (02/02 0513)  Labs: Recent Labs    04/28/20 0220 04/28/20 1959 04/29/20 0106 04/30/20 0549  HGB 6.8* 8.7* 8.0* 8.4*  HCT 21.8* 27.4* 25.1* 25.4*  PLT 252  --  269 308  LABPROT 18.2*  --  18.2* 20.0*  INR 1.6*  --  1.6* 1.8*  CREATININE 1.26*  --   --  1.14*    Estimated Creatinine Clearance: 38.9 mL/min (A) (by C-G formula based on SCr of 1.14 mg/dL (H)).   Medical History: Past Medical History:  Diagnosis Date  . Arthritis    "fingers, hips, feet, ankles" (11/10/2012)  . Asthma   . Atrial fibrillation (HCC)    CHRONIC COUMADIN  . Breast cancer ([place]) 1990's   "cancer on one side; precancerous tissue on the other" (11/10/2012)   . Chronic bronchitis ([place])    "multiple times; not in a long time" (11/10/2012  . Depression   . GERD (gastroesophageal reflux disease)   . Hearing impaired   . Hypertension   . Irritable bowel   . Lymphedema    HX OF - IN LEFT ARM--NO NEEDLES OR B/P'S LEFT ARM  . Macular degeneration    "BEGINNINGS OF" MACULAR DEGENERATION  . Osteoporosis   . Pneumonia    "multiple times; not in a long time" (11/10/2012)  . Recurrent UTI   . Sleep apnea    "dx'd w/it; don't wear mask or anything" (11/10/2012)  . Stroke ([place]) ~ 2005   HX OF TIA-NO RESIDUAL PROBLEM--PARALYSIS RT SIDE FACE /PT'S MOUTH DROOPS-AND LOSS OF HEARING RT EAR --SINCE EAR SURGERY AS A CHILD   . UTI (lower urinary tract infection)    FREQUENT    Assessment: 87 year old with PMH significant for PAF on coumadin, recurrent UTI, HTN, HLD admitted to [facility] 04/22/2020 with acute closed left intratrochanteric hip fracture.Patient developed worsening dysphagia, MRI was obtain which showed acute stroke. Warfarin held per neurology recommendation. Pharmacy consulted to restart Warfarin on 1/31 - per neurology no bridging except aspirin until INR of 2.  Home warfarin regimen -Take 5 mg by mouth with supper on Sun/Mon/Wed/Fri/Sat and 7.5 mg on Tues/Thurs.  Today, INR 1.8 is subtherapeutic. Hgb 8.4. Plt wnl. No reported bleeding.   Goal of Therapy:  INR 2-3  Plan:  Warfarin 5 mg x1  Monitor daily INR and CBC Monitor s/s of bleeding   [place] Crane, Roylene Reason, [facility] Clinical Pharmacist  04/30/2020 8:38 AM   [facility] pharmacy phone numbers are listed on [email or web address]

## 2020-04-30 NOTE — Progress Notes (Signed)
ANTICOAGULATION CONSULT NOTE - Follow Up Consult  Pharmacy Consult for Warfarin Indication: atrial fibrillation  Allergies  Allergen Reactions  . Pradaxa [Dabigatran Etexilate Mesylate] Other (See Comments)    INTERNAL BLEEDING  . Clindamycin/Lincomycin Other (See Comments)    PT STATES HER DOCTOR TOLD HER NOT TO TAKE CLINDAMYCIN BECAUSE SHE GOT C-DIFF AFTER TAKING AMPICILLIN- "tolerated in 2022" (per spouse)  . Latex Other (See Comments)    Blisters   . Ampicillin Other (See Comments)    C-DIF AFTER TAKING AMPICILLIN- "tolerated in 2022" (per husband) Has patient had a PCN reaction causing immediate rash, facial/tongue/throat swelling, SOB or lightheadedness with hypotension: No Has patient had a PCN reaction causing severe rash involving mucus membranes or skin necrosis: No Has patient had a PCN reaction that required hospitalization: No Has patient had a PCN reaction occurring within the last 10 years: No If all of the above answers are "NO", then may proceed with Cephalosporin u  . Sulfamethoxazole Diarrhea    Patient Measurements: Height: 5\' 4"  (162.6 cm) Weight: 85.5 kg (188 lb 7.9 oz) IBW/kg (Calculated) : 54.7  Vital Signs: Temp: 98.9 F (37.2 C) (02/02 0513) Temp Source: Oral (02/02 0513) BP: 160/96 (02/02 0513) Pulse Rate: 86 (02/02 0513)  Labs: Recent Labs    04/28/20 0220 04/28/20 1959 04/29/20 0106 04/30/20 0549  HGB 6.8* 8.7* 8.0* 8.4*  HCT 21.8* 27.4* 25.1* 25.4*  PLT 252  --  269 308  LABPROT 18.2*  --  18.2* 20.0*  INR 1.6*  --  1.6* 1.8*  CREATININE 1.26*  --   --  1.14*    Estimated Creatinine Clearance: 38.9 mL/min (A) (by C-G formula based on SCr of 1.14 mg/dL (H)).   Medical History: Past Medical History:  Diagnosis Date  . Arthritis    "fingers, hips, feet, ankles" (11/10/2012)  . Asthma   . Atrial fibrillation (HCC)    CHRONIC COUMADIN  . Breast cancer ([place]) 1990's   "cancer on one side; precancerous tissue on the other"  (11/10/2012)  . Chronic bronchitis ([place])    "multiple times; not in a long time" (11/10/2012  . Depression   . GERD (gastroesophageal reflux disease)   . Hearing impaired   . Hypertension   . Irritable bowel   . Lymphedema    HX OF - IN LEFT ARM--NO NEEDLES OR B/P'S LEFT ARM  . Macular degeneration    "BEGINNINGS OF" MACULAR DEGENERATION  . Osteoporosis   . Pneumonia    "multiple times; not in a long time" (11/10/2012)  . Recurrent UTI   . Sleep apnea    "dx'd w/it; don't wear mask or anything" (11/10/2012)  . Stroke ([place]) ~ 2005   HX OF TIA-NO RESIDUAL PROBLEM--PARALYSIS RT SIDE FACE /PT'S MOUTH DROOPS-AND LOSS OF HEARING RT EAR --SINCE EAR SURGERY AS A CHILD   . UTI (lower urinary tract infection)    FREQUENT    Assessment: 87 year old with PMH significant for PAF on coumadin, recurrent UTI, HTN, HLD admitted to [facility] 04/22/2020 with acute closed left intratrochanteric hip fracture.Patient developed worsening dysphagia, MRI was obtain which showed acute stroke. Warfarin held per neurology recommendation. Pharmacy consulted to restart Warfarin on 1/31 - per neurology no bridging except aspirin until INR of 2.  Home warfarin regimen -Take 5 mg by mouth with supper on Sun/Mon/Wed/Fri/Sat and 7.5 mg on Tues/Thurs.  Today, INR 1.8 is subtherapeutic. Hgb 8.4. Plt wnl. No reported bleeding.   Goal of Therapy:  INR 2-3  Plan:  Warfarin 5 mg x1  Monitor daily INR and CBC Monitor s/s of bleeding   [place] Crane, Roylene Reason, [facility] Clinical Pharmacist  04/30/2020 8:38 AM   [facility] pharmacy phone numbers are listed on [email or web address]

## 2020-04-30 NOTE — Evaluation (Signed)
Occupational Therapy Assessment and Plan  Patient Details  Name: [name] MRN: [id number] Date of Birth: 06-Dec-1933  OT Diagnosis: abnormal posture, acute pain, apraxia, cognitive deficits, muscular wasting and disuse atrophy and muscle weakness (generalized) Rehab Potential: Rehab Potential (ACUTE ONLY): Fair ELOS: 16-18 days   Today's Date: 04/30/2020 OT Individual Time: 5885-0277 OT Individual Time Calculation (min): 66 min     Hospital Problem: Principal Problem:   Intertrochanteric fracture of left hip (HCC) Active Problems:   Pressure injury of skin   Congestion of nasal sinus   Stage 3 chronic kidney disease ([place])   Benign essential HTN   Past Medical History:  Past Medical History:  Diagnosis Date  . Arthritis    "fingers, hips, feet, ankles" (11/10/2012)  . Asthma   . Atrial fibrillation (HCC)    CHRONIC COUMADIN  . Breast cancer ([place]) 1990's   "cancer on one side; precancerous tissue on the other" (11/10/2012)  . Chronic bronchitis ([place])    "multiple times; not in a long time" (11/10/2012  . Depression   . GERD (gastroesophageal reflux disease)   . Hearing impaired   . Hypertension   . Irritable bowel   . Lymphedema    HX OF - IN LEFT ARM--NO NEEDLES OR B/P'S LEFT ARM  . Macular degeneration    "BEGINNINGS OF" MACULAR DEGENERATION  . Osteoporosis   . Pneumonia    "multiple times; not in a long time" (11/10/2012)  . Recurrent UTI   . Sleep apnea    "dx'd w/it; don't wear mask or anything" (11/10/2012)  . Stroke ([place]) ~ 2005   HX OF TIA-NO RESIDUAL PROBLEM--PARALYSIS RT SIDE FACE /PT'S MOUTH DROOPS-AND LOSS OF HEARING RT EAR --SINCE EAR SURGERY AS A CHILD   . UTI (lower urinary tract infection)    FREQUENT   Past Surgical History:  Past Surgical History:  Procedure Laterality Date  . BREAST BIOPSY Bilateral 1990's  . Dacryocystorhinostomy  02/18/2016  . INNER EAR SURGERY Right    MULTIPLE EAR SURGERIES,  . INTRAMEDULLARY (IM) NAIL INTERTROCHANTERIC  Left 04/23/2020   Procedure: INTRAMEDULLARY (IM) NAIL INTERTROCHANTRIC;  Surgeon: Shona Needles, MD;  Location: [facility];  Service: Orthopedics;  Laterality: Left;  . JOINT REPLACEMENT    . KNEE ARTHROSCOPY  04/07/2012   Procedure: ARTHROSCOPY KNEE;  Surgeon: Gearlean Alf, MD;  Location: [facility];  Service: Orthopedics;  Laterality: Left;  WITH SYNOVECTOMY  . MASTECTOMY Bilateral 1990's  . MASTOIDECTOMY Right 1942  . ORIF FEMUR FRACTURE Left 08/08/2017   Procedure: OPEN REDUCTION INTERNAL FIXATION (ORIF) DISTAL FEMUR FRACTURE;  Surgeon: Shona Needles, MD;  Location: [facility];  Service: Orthopedics;  Laterality: Left;  . TONSILLECTOMY    . TOTAL KNEE ARTHROPLASTY  09/20/2011   LEFT TOTAL KNEE ARTHROPLASTY;  Surgeon: Gearlean Alf, MD;  Location[facility];  Service: Orthopedics;  Laterality: Left;  . TOTAL KNEE ARTHROPLASTY  2006    Assessment & Plan Clinical Impression:  [name] is an 87 year old right-handed female with history of atrial fibrillation on chronic Coumadin, CKD stage III with baseline 1.46, breast cancer with bilateral mastectomy 1990s, hearing impaired, hypertension, TIA without residual weakness, recurrent UTI, hyperlipidemia, bilateral total knee replacements as well as left femur fracture May 2019 with ORIF,.  Per chart review patient lives with spouse.  1 level home with ramped entrance.  Ambulates with a rolling walker.  Requires some assistance for lower body ADLs.  Presented 04/22/2020 after a fall without loss of consciousness  landing on her left hip.  She stated she leaned too far forward with falling out of her wheelchair.  Admission chemistries hemoglobin 10.1 WBC 11,700, BUN 30, creatinine 1.46, urinalysis negative nitrite, INR 2.3.  X-rays and imaging revealed left intertrochanteric femur fracture as well as healed distal femur fracture.  Patient did receive vitamin K to reverse Coumadin.  Underwent nailing of left intertrochanteric femur fracture  removal of hardware left femur 04/23/2020 per Dr. Doreatha Martin.  Weightbearing as tolerated left lower extremity.  Postoperative chronic Coumadin resumed.  Acute blood loss anemia 6.8 she did receive 1 unit packed red blood cells latest hemoglobin 8.0..  Creatinine remained stable 1.75 from baseline 1.46.  On 04/27/2020 patient with altered mental status reportedly having difficulty swallowing.  MRI of the brain showed small acute infarct in the right cerebellum bilaterally occipital parietal lobes and high left precentral gyrus.  Patient did not receive TPA.  MRA of the head and neck showed no large vessel occlusion.  Echocardiogram showed ejection fraction of 70 to 75% no regional wall motion abnormalities.  Patient's chronic Coumadin initially placed on hold and has been resumed no need for bridging except patient would remain on aspirin until INR greater than 2.00 and then discontinue.  Tolerating a regular diet.  Therapy evaluations completed due to patient decreased functional ability was admitted for a comprehensive rehab program.   Pt reports very few symptoms from CVA's- she was confused initially- says her vision and hearing already poor, so don't know if has visual issues.  She was having swallowing issues, but that and "thinking" have improved to baseline.    Having pain in L shoulder (which is new- since in hospital), L hip pain since fx, and breathing sometimes hurts with a deep breath..   Was walking at home with cane or RW at home prior to admission.    Is very hard of hearing- cannot hear at all on R_ can hear a little on L with hearing aid.   Patient transferred to CIR on 04/29/2020 .    Patient currently requires total with basic self-care skills secondary to muscle weakness and muscle joint tightness, decreased cardiorespiratoy endurance, decreased motor planning, delayed processing and decreased sitting balance, decreased standing balance, decreased postural control and decreased balance  strategies.  Prior to hospitalization, patient could complete BADLS with modified independent .  Patient will benefit from skilled intervention to increase independence with basic self-care skills prior to discharge home with care partner.  Anticipate patient will require moderate physical assestance and follow up home health.  OT - End of Session Activity Tolerance: Tolerates 10 - 20 min activity with multiple rests Endurance Deficit: Yes Endurance Deficit Description: Requires rest breaks for recovery to complete simple functional mobility tasks OT Assessment Rehab Potential (ACUTE ONLY): Fair OT Patient demonstrates impairments in the following area(s): Balance;Cognition;Endurance;Safety;Perception;Pain;Motor OT Basic ADL's Functional Problem(s): Bathing;Dressing;Toileting OT Transfers Functional Problem(s): Toilet OT Additional Impairment(s): None OT Plan OT Intensity: Minimum of 1-2 x/day, 45 to 90 minutes OT Frequency: 5 out of 7 days OT Duration/Estimated Length of Stay: 16-18 days OT Treatment/Interventions: Balance/vestibular training;Cognitive remediation/compensation;Discharge planning;Disease mangement/prevention;DME/adaptive equipment instruction;Functional mobility training;Neuromuscular re-education;Pain management;Psychosocial support;Patient/family education;Self Care/advanced ADL retraining;Therapeutic Activities;Therapeutic Exercise;UE/LE Strength taining/ROM;UE/LE Coordination activities;Visual/perceptual remediation/compensation OT Self Feeding Anticipated Outcome(s): independent OT Basic Self-Care Anticipated Outcome(s): mod A overall OT Toileting Anticipated Outcome(s): mod A OT Bathroom Transfers Anticipated Outcome(s): mod A OT Recommendation Patient destination: Home Follow Up Recommendations: Home health OT Equipment Recommended: To be determined   OT Evaluation  Precautions/Restrictions  Precautions Precautions: Fall Restrictions Weight Bearing  Restrictions: Yes LLE Weight Bearing: Weight bearing as tolerated  Pain  no c/o pain at rest, but 8/10 with movement in L hip  Home Living/Prior Functioning Home Living Living Arrangements: Spouse/significant other Available Help at Discharge: Family,Available 24 hours/day Type of Home: House Home Access: Ramped entrance Home Layout: Two level,Able to live on main level with bedroom/bathroom Alternate Level Stairs-Number of Steps: full flight Alternate Level Stairs-Rails: Right Bathroom Shower/Tub: Multimedia programmer: Handicapped height (BSC over toilet)  Lives With: Spouse Prior Function Level of Independence: Requires assistive device for independence,Independent with transfers,Independent with basic ADLs,Independent with homemaking with ambulation  Able to Take Stairs?: Yes Driving: No Vocation: Retired Comments: Mod I with RW, ambulating short community distances Vision Baseline Vision/History: Wears glasses Wears Glasses: At all times Patient Visual Report: No change from baseline Vision Assessment?: No apparent visual deficits Perception  Perception: Impaired Praxis Praxis: Impaired Praxis Impairment Details: Motor planning;Initiation;Ideomotor Cognition Overall Cognitive Status: Difficult to assess Arousal/Alertness: Awake/alert Orientation Level: Person;Place;Situation Person: Oriented Place: Oriented Situation: Oriented Year: 2022 Month: February Day of Week: Correct Memory: Appears intact Immediate Memory Recall: Sock;Blue;Bed Memory Recall Sock: Without Cue Memory Recall Blue: Without Cue Memory Recall Bed: Without Cue Problem Solving: Impaired Safety/Judgment: Impaired Sensation Sensation Light Touch: Appears Intact Hot/Cold: Appears Intact Proprioception: Appears Intact Stereognosis: Appears Intact Coordination Gross Motor Movements are Fluid and Coordinated: No Fine Motor Movements are Fluid and Coordinated: No Coordination and  Movement Description: Pain inhibition from hip fx Motor  Motor Motor: Other (comment) Motor - Skilled Clinical Observations: pain inhibition, blocked movement patterns  Trunk/Postural Assessment  Cervical Assessment Cervical Assessment: Exceptions to [facility] (forward head carriage) Thoracic Assessment Thoracic Assessment: Exceptions to [facility] (kyphotic spine) Lumbar Assessment Lumbar Assessment: Exceptions to [facility] (post pelvic tilt) Postural Control Postural Control: Deficits on evaluation  Balance Balance Balance Assessed: Yes Static Sitting Balance Static Sitting - Balance Support: Feet supported;No upper extremity supported Static Sitting - Level of Assistance: 4: Min Insurance risk surveyor Standing - Balance Support: Bilateral upper extremity supported Static Standing - Level of Assistance: 4: Min assist (In [place]) Static Standing - Comment/# of Minutes: 3 Extremity/Trunk Assessment RUE Assessment Active Range of Motion (AROM) Comments: sh flex to 160 General Strength Comments: 4-/5 LUE Assessment Active Range of Motion (AROM) Comments: sh flexion to 90 (premorbid) General Strength Comments: 4-/5  Care Tool Care Tool Self Care Eating    set up    Oral Care    Oral Care Assist Level: Set up assist    Bathing   Body parts bathed by patient: Right arm;Left arm;Chest;Abdomen;Right upper leg;Left upper leg;Face Body parts bathed by helper: Buttocks;Front perineal area;Right lower leg;Left lower leg   Assist Level: Moderate Assistance - Patient 50 - 74%    Upper Body Dressing(including orthotics)   What is the patient wearing?: Pull over shirt   Assist Level: Maximal Assistance - Patient 25 - 49%    Lower Body Dressing (excluding footwear)   What is the patient wearing?: Incontinence brief;Pants Assist for lower body dressing: 2 Helpers    Putting on/Taking off footwear   What is the patient wearing?: Ted hose;Shoes Assist for footwear: Dependent -  Patient 0%       Care Tool Toileting Toileting activity   Assist for toileting: 2 Helpers     Care Tool Bed Mobility Roll left and right activity   Roll left and right assist level: Total Assistance - Patient <  25%    Sit to lying activity   Sit to lying assist level: Total Assistance - Patient < 25%    Lying to sitting edge of bed activity   Lying to sitting edge of bed assist level: Total Assistance - Patient < 25%     Care Tool Transfers Sit to stand transfer   Sit to stand assist level: 2 Helpers    Chair/bed transfer   Chair/bed transfer assist level: 2 Helpers     Toilet transfer   Assist Level: 2 Helpers     Care Tool Cognition Expression of Ideas and Wants Expression of Ideas and Wants: Without difficulty (complex and basic) - expresses complex messages without difficulty and with speech that is clear and easy to understand   Understanding Verbal and Non-Verbal Content Understanding Verbal and Non-Verbal Content: Usually understands - understands most conversations, but misses some part/intent of message. Requires cues at times to understand   Memory/Recall Ability *first 3 days only Memory/Recall Ability *first 3 days only: Staff names and faces;That he or she is in a hospital/hospital unit;Location of own room;Current season    Refer to Care Plan for Long Term Goals  SHORT TERM GOAL WEEK 1 OT Short Term Goal 1 (Week 1): Pt will be able to rise to stand with RW with max A of 1 to prepare for toileting. OT Short Term Goal 2 (Week 1): Pt will be able to stand with RW and release 1 hand for clothing management with max A. OT Short Term Goal 3 (Week 1): Pt will be able to stand pivot with RW with max A of 1. OT Short Term Goal 4 (Week 1): Pt will demonstrate improved processing by donning shirt with min cues.  Recommendations for other services: None    Skilled Therapeutic Intervention Pt seen for initial evaluation and ADL training with spouse present. Pt is  very HOH and can only hear in L ear with hearing aid in.  She feels her stroke was mild and can not identify any areas of deficits.  She has a number of premorbid conditions (limited knee ROM, limited sh ROM, severe kyphotic posture) that make her movement patterns very difficult now that she has a painful and stiff LLE.  Overall she required total A with bed mobility, sit to stand to RW and pivoting with RW to [facility] which was NOT a safe transfer. Pt was incontinent of urine in brief prior to sitting on BSC. Pt bathed and dressed [facility] from [facility] with max A and mod cues.  Total A with LB, used stedy to get off of BSC with TOTAL A of 2 as she was not initiating at all .  Unable to pull up with her arms.    Pt will need several weeks of rehab to build strength and tolerance to movement to be able to move at a mod A level or less.   Pt resting in wc with all needs met.  L elevating leg rest provided.     Mobility  Bed Mobility Bed Mobility:  (Pt received and ended session in her w/c. Unable to assess bed mobility) Transfers Sit to Stand: 2 Helpers;Dependent - mechanical lift;Other/comment Stand to Sit: 2 Helpers;Dependent - mechanical lift;Other/comment   Discharge Criteria: Patient will be discharged from OT if patient refuses treatment 3 consecutive times without medical reason, if treatment goals not met, if there is a change in medical status, if patient makes no progress towards goals or if patient is discharged from  hospital.  The above assessment, treatment plan, treatment alternatives and goals were discussed and mutually agreed upon: by patient and by family  Quintessa Simmerman 04/30/2020, 1:15 PM

## 2020-04-30 NOTE — Progress Notes (Signed)
Inpatient Rehabilitation Care Coordinator Assessment and Plan Patient Details  Name: [name] MRN: [id number] Date of Birth: 08-06-1933  Today's Date: 04/30/2020  Hospital Problems: Principal Problem:   Intertrochanteric fracture of left hip ([place]) Active Problems:   Pressure injury of skin  Past Medical History:  Past Medical History:  Diagnosis Date  . Arthritis    "fingers, hips, feet, ankles" (11/10/2012)  . Asthma   . Atrial fibrillation (HCC)    CHRONIC COUMADIN  . Breast cancer ([place]) 1990's   "cancer on one side; precancerous tissue on the other" (11/10/2012)  . Chronic bronchitis ([place])    "multiple times; not in a long time" (11/10/2012  . Depression   . GERD (gastroesophageal reflux disease)   . Hearing impaired   . Hypertension   . Irritable bowel   . Lymphedema    HX OF - IN LEFT ARM--NO NEEDLES OR B/P'S LEFT ARM  . Macular degeneration    "BEGINNINGS OF" MACULAR DEGENERATION  . Osteoporosis   . Pneumonia    "multiple times; not in a long time" (11/10/2012)  . Recurrent UTI   . Sleep apnea    "dx'd w/it; don't wear mask or anything" (11/10/2012)  . Stroke ([place]) ~ 2005   HX OF TIA-NO RESIDUAL PROBLEM--PARALYSIS RT SIDE FACE /PT'S MOUTH DROOPS-AND LOSS OF HEARING RT EAR --SINCE EAR SURGERY AS A CHILD   . UTI (lower urinary tract infection)    FREQUENT   Past Surgical History:  Past Surgical History:  Procedure Laterality Date  . BREAST BIOPSY Bilateral 1990's  . Dacryocystorhinostomy  02/18/2016  . INNER EAR SURGERY Right    MULTIPLE EAR SURGERIES,  . INTRAMEDULLARY (IM) NAIL INTERTROCHANTERIC Left 04/23/2020   Procedure: INTRAMEDULLARY (IM) NAIL INTERTROCHANTRIC;  Surgeon: Shona Needles, MD;  Location: [place];  Service: Orthopedics;  Laterality: Left;  . JOINT REPLACEMENT    . KNEE ARTHROSCOPY  04/07/2012   Procedure: ARTHROSCOPY KNEE;  Surgeon: Gearlean Alf, MD;  [facility];  Service: Orthopedics;  Laterality: Left;  WITH  SYNOVECTOMY  . MASTECTOMY Bilateral 1990's  . MASTOIDECTOMY Right 1942  . ORIF FEMUR FRACTURE Left 08/08/2017   Procedure: OPEN REDUCTION INTERNAL FIXATION (ORIF) DISTAL FEMUR FRACTURE;  Surgeon: Shona Needles, MD;  Location: [place];  Service: Orthopedics;  Laterality: Left;  . TONSILLECTOMY    . TOTAL KNEE ARTHROPLASTY  09/20/2011   LEFT TOTAL KNEE ARTHROPLASTY;  Surgeon: Gearlean Alf, MD;  [facility];  Service: Orthopedics;  Laterality: Left;  . TOTAL KNEE ARTHROPLASTY  2006   Social History:  reports that she has never smoked. She has never used smokeless tobacco. She reports that she does not drink alcohol and does not use drugs.  Family / Support Systems Marital Status: Married How Long?: 3 years Patient Roles: Spouse,Partner Spouse/Significant Other: Jenny Reichmann (husband): [phone] Children: 2 adult sons; one [place] and the other lives in [place]. Other Supports: None reported Anticipated Caregiver: husband Ability/Limitations of Caregiver: none reported Caregiver Availability: 24/7 Family Dynamics: Pt lives with husband and they have a housekeeper that comes in the home every two 2weeks.  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 States that she stopped working because her husband's job as an Chief Financial Officer caused them to travel often. Education: college grad Read: Yes Write: Yes Employment Status: Retired Public relations account executive Issues: Denies  Guardian/Conservator: N/A   Abuse/Neglect Abuse/Neglect Assessment Can Be Completed: Yes Physical Abuse: Denies Verbal Abuse: Denies Sexual Abuse: Denies Exploitation of patient/patient's resources: Denies Self-Neglect: Denies  Emotional Status Pt's affect, behavior and adjustment status: Pt in good spirits at time of visit. pt is hard of hearing and hears  best on her right side. Recent Psychosocial Issues: Denies Psychiatric History: Denies Substance Abuse History: Denies  Patient / Family Perceptions, Expectations & Goals Pt/Family understanding of illness & functional limitations: Pt and husband have a general understanding of pt care needs Premorbid pt/family roles/activities: Independent and assistsance with stairs Anticipated changes in roles/activities/participation: Assistance with ADLs/IADLs  [employer]: None Premorbid Home Care/DME Agencies: None Transportation available at discharge: Husband  Discharge Planning Living Arrangements: Spouse/significant other [place]: Spouse/significant other Type of Residence: Private residence Insurance Resources: [employer] (specify) (Mutual of Geographical information systems officer) Financial Resources: Family Support Financial Screen Referred: No Living Expenses: Own Money Management: Spouse Does the patient have any problems obtaining your medications?: No Home Management: Pt husband managed all housekeeping and meals; pt assisted at times with meals Patient/Family Preliminary Plans: No changes Care Coordinator Barriers to Discharge: Decreased caregiver support,Lack of/limited family support Care Coordinator Anticipated Follow Up Needs: HH/OP  Clinical Impression SW met with pt in room to complete assessment, introduce self, and explain discharge process. Pt husband arrived during assessment. Pt is not a English as a second language teacher. Pt husband states they have an HCPOA. DME: RW, cane, and rollator- states purchased several years ago.   Duane Trias A Talmage Teaster 04/30/2020, 11:14 AM

## 2020-04-30 NOTE — Evaluation (Signed)
Physical Therapy Assessment and Plan  Patient Details  Name: Grettell Ransdell MRN: [id number] Date of Birth: [birth year]  PT Diagnosis: Abnormal posture, Abnormality of gait, Cognitive deficits, Difficulty walking, Impaired cognition and Muscle weakness Rehab Potential: Fair ELOS: 2.5 weeks   Today's Date: 04/30/2020 PT Individual Time: 1010-1104 PT Individual Time Calculation (min): 54 min    Hospital Problem: Principal Problem:   Intertrochanteric fracture of left hip ([place]) Active Problems:   Pressure injury of skin   Past Medical History:  Past Medical History:  Diagnosis Date  . Arthritis    "fingers, hips, feet, ankles" (11/10/2012)  . Asthma   . Atrial fibrillation (HCC)    CHRONIC COUMADIN  . Breast cancer ([place]) 1990's   "cancer on one side; precancerous tissue on the other" (11/10/2012)  . Chronic bronchitis ([place])    "multiple times; not in a long time" (11/10/2012  . Depression   . GERD (gastroesophageal reflux disease)   . Hearing impaired   . Hypertension   . Irritable bowel   . Lymphedema    HX OF - IN LEFT ARM--NO NEEDLES OR B/P'S LEFT ARM  . Macular degeneration    "BEGINNINGS OF" MACULAR DEGENERATION  . Osteoporosis   . Pneumonia    "multiple times; not in a long time" (11/10/2012)  . Recurrent UTI   . Sleep apnea    "dx'd w/it; don't wear mask or anything" (11/10/2012)  . Stroke ([place]) ~ 2005   HX OF TIA-NO RESIDUAL PROBLEM--PARALYSIS RT SIDE FACE /PT'S MOUTH DROOPS-AND LOSS OF HEARING RT EAR --SINCE EAR SURGERY AS A CHILD   . UTI (lower urinary tract infection)    FREQUENT   Past Surgical History:  Past Surgical History:  Procedure Laterality Date  . BREAST BIOPSY Bilateral 1990's  . Dacryocystorhinostomy  02/18/2016  . INNER EAR SURGERY Right    MULTIPLE EAR SURGERIES,  . INTRAMEDULLARY (IM) NAIL INTERTROCHANTERIC Left 04/23/2020   Procedure: INTRAMEDULLARY (IM) NAIL INTERTROCHANTRIC;  Surgeon: Shona Needles, MD;  Location: [facility];  Service:  Orthopedics;  Laterality: Left;  . JOINT REPLACEMENT    . KNEE ARTHROSCOPY  04/07/2012   Procedure: ARTHROSCOPY KNEE;  Surgeon: Gearlean Alf, MD;  Location[facility];  Service: Orthopedics;  Laterality: Left;  WITH SYNOVECTOMY  . MASTECTOMY Bilateral 1990's  . MASTOIDECTOMY Right 1942  . ORIF FEMUR FRACTURE Left 08/08/2017   Procedure: OPEN REDUCTION INTERNAL FIXATION (ORIF) DISTAL FEMUR FRACTURE;  Surgeon: Shona Needles, MD;  Location: [place];  Service: Orthopedics;  Laterality: Left;  . TONSILLECTOMY    . TOTAL KNEE ARTHROPLASTY  09/20/2011   LEFT TOTAL KNEE ARTHROPLASTY;  Surgeon: Gearlean Alf, MD;  Location: [facility];  Service: Orthopedics;  Laterality: Left;  . TOTAL KNEE ARTHROPLASTY  2006    Assessment & Plan Clinical Impression: Patient is an 87 year old right-handed female with history of atrial fibrillation on chronic Coumadin, CKD stage III with baseline 1.46, breast cancer with bilateral mastectomy 1990s, hearing impaired, hypertension, TIA without residual weakness, recurrent UTI, hyperlipidemia, bilateral total knee replacements as well as left femur fracture May 2019 with ORIF,.  Per chart review patient lives with spouse.  1 level home with ramped entrance.  Ambulates with a rolling walker.  Requires some assistance for lower body ADLs.  Presented 04/22/2020 after a fall without loss of consciousness landing on her left hip.  She stated she leaned too far forward with falling out of her wheelchair.  Admission chemistries hemoglobin 10.1 WBC 11,700, BUN 30,  creatinine 1.46, urinalysis negative nitrite, INR 2.3.  X-rays and imaging revealed left intertrochanteric femur fracture as well as healed distal femur fracture.  Patient did receive vitamin K to reverse Coumadin.  Underwent nailing of left intertrochanteric femur fracture removal of hardware left femur 04/23/2020 per Dr. Doreatha Martin.  Weightbearing as tolerated left lower extremity.  Postoperative chronic Coumadin  resumed.  Acute blood loss anemia 6.8 she did receive 1 unit packed red blood cells latest hemoglobin 8.0..  Creatinine remained stable 1.75 from baseline 1.46.  On 04/27/2020 patient with altered mental status reportedly having difficulty swallowing.  MRI of the brain showed small acute infarct in the right cerebellum bilaterally occipital parietal lobes and high left precentral gyrus.  Patient did not receive TPA.  MRA of the head and neck showed no large vessel occlusion.  Echocardiogram showed ejection fraction of 70 to 75% no regional wall motion abnormalities.  Patient's chronic Coumadin initially placed on hold and has been resumed no need for bridging except patient would remain on aspirin until INR greater than 2.00 and then discontinue.  Tolerating a regular diet.  Therapy evaluations completed due to patient decreased functional ability was admitted for a comprehensive rehab program. Patient transferred to CIR on 04/29/2020 .   Patient currently requires total with mobility secondary to muscle weakness, decreased cardiorespiratoy endurance, unbalanced muscle activation, motor apraxia and decreased motor planning, decreased motor planning, decreased initiation, decreased problem solving and delayed processing and decreased sitting balance, decreased standing balance and decreased balance strategies.  Prior to hospitalization, patient was modified independent  with mobility and lived with Spouse in a House home.  Home access is  Ramped entrance.  Patient will benefit from skilled PT intervention to maximize safe functional mobility, minimize fall risk and decrease caregiver burden for planned discharge home with 24 hour assist.  Anticipate patient will benefit from follow up [facility] at discharge.  PT - End of Session Activity Tolerance: Tolerates 10 - 20 min activity with multiple rests Endurance Deficit: Yes Endurance Deficit Description: Requires rest breaks for recovery to complete simple functional  mobility tasks PT Assessment Rehab Potential (ACUTE/IP ONLY): Fair PT Barriers to Discharge: Decreased caregiver support;Insurance for SNF coverage;Weight bearing restrictions;Home environment access/layout;Lack of/limited family support PT Patient demonstrates impairments in the following area(s): Balance;Endurance;Motor;Pain;Safety;Perception PT Transfers Functional Problem(s): Bed Mobility;Bed to Chair;Car PT Locomotion Functional Problem(s): Ambulation;Wheelchair Mobility;Stairs PT Plan PT Intensity: Minimum of 1-2 x/day ,45 to 90 minutes PT Frequency: 5 out of 7 days PT Duration Estimated Length of Stay: 2.5 weeks PT Treatment/Interventions: Ambulation/gait training;Balance/vestibular training;Community reintegration;Cognitive remediation/compensation;Discharge planning;Disease management/prevention;Functional electrical stimulation;DME/adaptive equipment instruction;Functional mobility training;Neuromuscular re-education;Patient/family education;Pain management;Psychosocial support;Skin care/wound management;Stair training;Splinting/orthotics;Therapeutic Activities;Therapeutic Exercise;UE/LE Coordination activities;UE/LE Strength taining/ROM;Visual/perceptual remediation/compensation;Wheelchair propulsion/positioning PT Transfers Anticipated Outcome(s): modA with LRAD PT Locomotion Anticipated Outcome(s): modA with LRAD PT Recommendation Recommendations for Other Services: Neuropsych consult Follow Up Recommendations: Home health PT Patient destination: Home Equipment Recommended: To be determined Equipment Details: Pt owns a w/c and RW   PT Evaluation Precautions/Restrictions Precautions Precautions: Fall Restrictions Weight Bearing Restrictions: Yes LLE Weight Bearing: Weight bearing as tolerated General Chart Reviewed: Yes Family/Caregiver Present: Yes (Husband - [name])  Home Living/Prior Functioning Home Living Available Help at Discharge: Family;Available 24  hours/day Type of Home: House Home Access: Ramped entrance Home Layout: Two level;Able to live on main level with bedroom/bathroom Alternate Level Stairs-Number of Steps: full flight Alternate Level Stairs-Rails: Right Bathroom Shower/Tub: Walk-in shower Bathroom Toilet: Handicapped height (BSC over toilet)  Lives With: Spouse Prior Function Level of  Independence: Requires assistive device for independence;Independent with transfers  Able to Take Stairs?: Yes Driving: No Vocation: Retired Comments: Mod I with RW, ambulating short community distances Vision/Perception  Perception Perception: Impaired Praxis Praxis: Impaired Praxis Impairment Details: Motor planning;Initiation;Ideomotor  Cognition Overall Cognitive Status: Difficult to assess Arousal/Alertness: Awake/alert Orientation Level: Oriented X4 Problem Solving: Impaired Safety/Judgment: Impaired Sensation Sensation Light Touch: Appears Intact Hot/Cold: Appears Intact Proprioception: Appears Intact Stereognosis: Appears Intact Coordination Gross Motor Movements are Fluid and Coordinated: No Coordination and Movement Description: Pain inhibition from hip fx Motor  Motor Motor: Other (comment) Motor - Skilled Clinical Observations: pain inhibition, blocked movement patterns   Trunk/Postural Assessment  Cervical Assessment Cervical Assessment: Exceptions to [facility] (forward head carriage) Thoracic Assessment Thoracic Assessment: Exceptions to [facility] (kyphotic spine) Lumbar Assessment Lumbar Assessment: Exceptions to [facility] (post pelvic tilt) Postural Control Postural Control: Deficits on evaluation  Balance Balance Balance Assessed: Yes Static Sitting Balance Static Sitting - Balance Support: Feet supported;No upper extremity supported Static Sitting - Level of Assistance: 4: Min Insurance risk surveyor Standing - Balance Support: Bilateral upper extremity supported Static Standing - Level of  Assistance: 4: Min assist (In [place]) Static Standing - Comment/# of Minutes: 3 Extremity Assessment      RLE Assessment RLE Assessment: Exceptions to [facility] General Strength Comments: Ankle DF 4/5, knee ext 4/5, hip flex 2+/5 LLE Assessment LLE Assessment: Exceptions to [facility] General Strength Comments: Ankle DF 3/5, knee ext 2+/5, hip flex 2-/5 (pain inhibition)  Care Tool Care Tool Bed Mobility Roll left and right activity   Roll left and right assist level: Total Assistance - Patient < 25%    Sit to lying activity   Sit to lying assist level: Total Assistance - Patient < 25%    Lying to sitting edge of bed activity   Lying to sitting edge of bed assist level: Total Assistance - Patient < 25%     Care Tool Transfers Sit to stand transfer   Sit to stand assist level: 2 Helpers    Chair/bed transfer   Chair/bed transfer assist level: 2 Public relations account executive transfer activity did not occur: Safety/medical concerns        Care Tool Locomotion Ambulation Ambulation activity did not occur: Safety/medical concerns        Walk 10 feet activity Walk 10 feet activity did not occur: Safety/medical concerns       Walk 50 feet with 2 turns activity Walk 50 feet with 2 turns activity did not occur: Safety/medical concerns      Walk 150 feet activity Walk 150 feet activity did not occur: Safety/medical concerns      Walk 10 feet on uneven surfaces activity Walk 10 feet on uneven surfaces activity did not occur: Safety/medical concerns      Stairs Stair activity did not occur: Safety/medical concerns        Walk up/down 1 step activity Walk up/down 1 step or curb (drop down) activity did not occur: Safety/medical concerns     Walk up/down 4 steps activity did not occuR: Safety/medical concerns  Walk up/down 4 steps activity      Walk up/down 12 steps activity Walk up/down 12 steps activity did not occur: Safety/medical concerns      Pick  up small objects from floor Pick up small object from the floor (from standing position) activity did not occur: Safety/medical concerns  Wheelchair Will patient use wheelchair at discharge?: No Type of Wheelchair: Manual   Wheelchair assist level: Minimal Assistance - Patient > 75% Max wheelchair distance: 65f  Wheel 50 feet with 2 turns activity Wheelchair 50 feet with 2 turns activity did not occur: Safety/medical concerns    Wheel 150 feet activity Wheelchair 150 feet activity did not occur: Safety/medical concerns      Refer to Care Plan for Long Term Goals  SHORT TERM GOAL WEEK 1 PT Short Term Goal 1 (Week 1): Pt will complete bed mobility with maxA PT Short Term Goal 2 (Week 1): Pt will complete bed<>chair transfers with maxA and LRAD PT Short Term Goal 3 (Week 1): Pt will initiate gait training with LRAD  Recommendations for other services: Neuropsych  Skilled Therapeutic Intervention Mobility Bed Mobility Bed Mobility:  (Pt received and ended session in her w/c. Unable to assess bed mobility) Transfers Transfers: Sit to Stand;Stand to Sit Sit to Stand: 2 Helpers;Dependent - mechanical lift;Other/comment Sit to Stand Comment: Stedy Stand to Sit: 2 Helpers;Dependent - mechanical lift;Other/comment Stand to Sit Comment: SEducation officer, community: Other (Comment) Transfer via Lift Equipment: SAnimal nutritionist No Gait Gait: No Stairs / Additional Locomotion Stairs: No WArchitect Yes Wheelchair Assistance: Minimal assistance - Patient >75% WEnvironmental health practitioner Both upper extremities Wheelchair Parts Management: Needs assistance Distance: 164f Skilled Intervention: Pt received sitting upright in manual w/c. Husband at bedside. Pt agreeable to therapy session. Initiated functional mobility as outlined above. Pt is VERY hard of hearing despite having hearing aids. Reports she is deaf in the R ear. W/c  transport for time management to main therapy gym. Attempted sit<>stand from w/c height but unable to clear buttock from chair despite maxA from therapist. Noted very poor initiation from patient. For safety, deferred further efforts without a +2 assist. Retrieved bariatric stedy and completed sit<>stand with maxA +2 to stedy and was able to maintain standing in stedy with CGA. Standing trials to fatigue in stedy, able to stand for 3 min, 1 min, and 45 sec. Able to stand from perched position with CGA. Pt propelled herself with minA, 1026fusing BUE's in w/c but distance limited by fatigue. Pt returned remaining distance to her room and she remained seated in w/c with needs in reach and husband at bedside.  Instructed pt in results of PT evaluation as detailed above, PT POC, rehab potential, rehab goals, and discharge recommendations. Additionally discussed CIR's policies regarding fall safety and use of chair alarm and/or quick release belt. Pt verbalized understanding and in agreement. Will update pt's family members as they become available.   Discharge Criteria: Patient will be discharged from PT if patient refuses treatment 3 consecutive times without medical reason, if treatment goals not met, if there is a change in medical status, if patient makes no progress towards goals or if patient is discharged from hospital.  The above assessment, treatment plan, treatment alternatives and goals were discussed and mutually agreed upon: by patient and husband  ChrAlger Simons, DPT 04/30/2020, 12:40 PM

## 2020-04-30 NOTE — Progress Notes (Addendum)
[name] PHYSICAL MEDICINE & REHABILITATION PROGRESS NOTE  Subjective/Complaints: Patient seen sitting up in bed this AM.  She states she did not sleep well overnight because she could not get assistance getting in a comfortable position.  Husband at bedside, states pt had a good BM yesterday, pt states she is having difficulty.  She complains of congestion.  ROS: Denies CP, SOB, N/V/D  Objective: Vital Signs: Blood pressure (!) 160/96, pulse 86, temperature 98.9 F (37.2 C), temperature source Oral, resp. rate 18, height 5\' 4"  (1.626 m), weight 85.5 kg, SpO2 96 %. No results found. Recent Labs    04/29/20 0106 04/30/20 0549  WBC 7.3 9.3  HGB 8.0* 8.4*  HCT 25.1* 25.4*  PLT 269 308   Recent Labs    04/28/20 0220 04/30/20 0549  NA 139 139  K 3.7 3.8  CL 110 107  CO2 20* 20*  GLUCOSE 106* 123*  BUN 25* 25*  CREATININE 1.26* 1.14*  CALCIUM 8.0* 8.4*    Intake/Output Summary (Last 24 hours) at 04/30/2020 1155 Last data filed at 04/30/2020 0900 Gross per 24 hour  Intake 240 ml  Output --  Net 240 ml     Pressure Injury 04/29/20 Buttocks Right Stage 2 -  Partial thickness loss of dermis presenting as a shallow open injury with a red, pink wound bed without slough. Appears to be a blister that had busted. (Active)  04/29/20 1625  Location: Buttocks  Location Orientation: Right  Staging: Stage 2 -  Partial thickness loss of dermis presenting as a shallow open injury with a red, pink wound bed without slough.  Wound Description (Comments): Appears to be a blister that had busted.  Present on Admission: Yes    Physical Exam: BP (!) 160/96 (BP Location: Right Arm)   Pulse 86   Temp 98.9 F (37.2 C) (Oral)   Resp 18   Ht 5\' 4"  (1.626 m)   Wt 85.5 kg   SpO2 96%   BMI 32.35 kg/m  Constitutional: No distress . Vital signs reviewed. HENT: Normocephalic.  Atraumatic. Eyes: EOMI. No discharge. Cardiovascular: No JVD.  RRR. Respiratory: Normal effort.  No stridor.   Bilateral clear to auscultation. GI: Non-distended.  BS +. Skin: Warm and dry.  Left hip with dressing CDI. Psych: Easily agitated. Musc: No edema in extremities.  No tenderness in extremities. Neuro: Alert Motor: B/l UE: 4 -/5 proximal distal Right lower extremity: Hip flexion, knee extension 1+/5, ankle dorsiflexion 4/5 (some pain inhibition) Left extremity: Hip flexion, knee extension 1/5, ankle dorsiflexion 3/5 (some pain inhibition) Very HOH Right facial weakness  Assessment/Plan: 1. Functional deficits which require 3+ hours per day of interdisciplinary therapy in a comprehensive inpatient rehab setting.  Physiatrist is providing close team supervision and 24 hour management of active medical problems listed below.  Physiatrist and rehab team continue to assess barriers to discharge/monitor patient progress toward functional and medical goals   Care Tool:  Bathing              Bathing assist       Upper Body Dressing/Undressing Upper body dressing        Upper body assist      Lower Body Dressing/Undressing Lower body dressing            Lower body assist       Toileting Toileting    Toileting assist Assist for toileting: 2 Helpers     Transfers Chair/bed transfer  Transfers assist  Chair/bed transfer assist level: 2 Biomedical engineer   Ambulation assist        Assistive device: Other (comment) (Stedy to get on BSC)     Walk 10 feet activity   Assist           Walk 50 feet activity   Assist           Walk 150 feet activity   Assist           Walk 10 feet on uneven surface  activity   Assist           Wheelchair     Assist               Wheelchair 50 feet with 2 turns activity    Assist            Wheelchair 150 feet activity     Assist           Medical Problem List and Plan: 1.  Decreased functional ability secondary to left intertrochanteric  femur fracture as well as history of left femur fracture May 2019 status post IM nailing as well as removal of hardware left femur from previous fracture 04/23/2020.  Weightbearing as tolerated.  Hospital course complicated by postoperative small acute infarct right cerebellum, bilateral occipital parietal lobes in the high left precentral gyrus.  Begin CIR evaluations  Team conference today to discuss current and goals and coordination of care, home and environmental barriers, and discharge planning with nursing, case manager, and therapies. Please see conference note from today as well.  2.  Antithrombotics: -DVT/anticoagulation: Chronic Coumadin             -antiplatelet therapy: Continue aspirin 325 mg daily until INR greater than 2.00 and then discontinue. 3. Pain Management: Oxycodone as needed, Robaxin as needed  See #17  Monitor with increased exertion 4. Mood: Provide emotional support             -antipsychotic agents: N/A 5. Neuropsych: This patient is ?fully capable of making decisions on her own behalf. 6.  Acute blood loss anemia.    Hemoglobin 8.4 on 2/2  Continue to monitor  7.  Atrial fibrillation.  Toprol-XL 25 mg daily.  Continue Coumadin.  Cardiac rate controlled  Monitor with increased activity 8. Hypertension: Chlorthalidone losartan currently on hold.  Continue Toprol  Monitor increase mobility 9.  CKD stage III.  Baseline creatinine ?1.46.    Creatinine 1.14 on 2/2  Continue to monitor 10.  Hyperlipidemia: Lipitor 11. Recurrent UTIs.  Latest urinalysis study negative.   12. Skin/Wound Care: Routine skin checks 13. Fluids/Electrolytes/Nutrition: Routine in and outs 14. Very hard of hearing- talk to her LEFT ear.  15.  Chronic diarrhea: used Imodium 2 mg BID daily at home  16. Leg length discrepancy- L shorter than Right- husband to bring in appropriate lift shoes.  17. L shoulder bicipital tendinitis based on clinical exam  Voltaren gel 2G QID and if doesn't  work 43.  Congestion  Flonase ordered  LOS: 1 days A FACE TO FACE EVALUATION WAS PERFORMED  [name] [name] [name] [name] 04/30/2020, 11:55 AM

## 2020-04-30 NOTE — Patient Care Conference (Signed)
Inpatient RehabilitationTeam Conference and Plan of Care Update Date: 04/30/2020   Time: 11:27 AM    Patient Name: [name]      Medical Record Number: [id number]  Date of Birth: 1933-05-01 Sex: Female         Room/Bed: 4W23C/4W23C-01 Payor Info: Payor: MEDICARE / Plan: MEDICARE PART A AND B / Product Type: *No Product type* /    Admit Date/Time:  04/29/2020  4:16 PM  Primary Diagnosis:  Intertrochanteric fracture of left hip [facility])  Hospital Problems: Principal Problem:   Intertrochanteric fracture of left hip (HCC) Active Problems:   Pressure injury of skin   Congestion of nasal sinus   Stage 3 chronic kidney disease ([place])   Benign essential HTN    Expected Discharge Date: Expected Discharge Date:  (ELOS 2 weeks;)  Team Members Present: Physician leading conference: Dr. Delice Lesch Care Coodinator Present: Dorien Chihuahua, RN, BSN, CRRN;Loralee Pacas, [place] Nurse Present: Other (comment) Demetrios Loll, RN) PT Present: Ginnie Smart, PT OT Present: Meriel Pica, OT PPS Coordinator present : Gunnar Fusi, Novella Olive, PT     Current Status/Progress Goal Weekly Team Focus  Bowel/Bladder   Patient is continent B/B  Patient will remain continent  with normal B/B  Tiolet patient as needed on every shift.   Swallow/Nutrition/ Hydration             ADL's             Mobility             Communication             Safety/Cognition/ Behavioral Observations            Pain   Patient pain is currently manage with Tylenol 650 mg  Assess patient for pain on every shift and as needed  Notify MD if pain is not relieved by current medication   Skin   Patient has blister on her bottom being treated with barrier cream  Patient skin imtegrity will remain intact with no new breakdown.  Assess patient's skin every shift and as needed for skin intergrity     Discharge Planning:  Pt to d/c to home with husband with 24/7 care.   Team Discussion: Chronic diarrhea  and pin issues limiting progress. MD monitoring CKD and HTN. Patient c/o congestion and is easily distracted, perseverative and HOH. Patient on target to meet rehab goals: Currently max assist +2 to stand  *See Care Plan and progress notes for long and short-term goals.   Revisions to Treatment Plan:   Teaching Needs: Transfers, toileting, medications incision care, safety, etc.  Current Barriers to Discharge: Decreased caregiver support and Home enviroment access/layout  Possible Resolutions to Barriers: Family education Hired help to assist with care    Medical Summary Current Status: Decreased functional ability secondary to left intertrochanteric femur fracture as well as history of left femur fracture May 2019.  Status post IM nailing as well as removal of hardware left femur from previous fracture 04/23/2020.  Weightbearing as tolerated.  Complicated by postoperative small acute infarct right cerebellum, bilateral occipital parietal lobes in the high left precentral gyrus.  Barriers to Discharge: Medical stability;Behavior;Decreased family/caregiver support;Weight   Possible Resolutions to [employer]: Therapies, optimize BP meds, monitor diarrhea, follow labs - Cr, optimize BP meds   Continued Need for Acute Rehabilitation Level of Care: The patient requires daily medical management by a physician with specialized training in physical medicine and rehabilitation for the following reasons: Direction of  a multidisciplinary physical rehabilitation program to maximize functional independence : Yes Medical management of patient stability for increased activity during participation in an intensive rehabilitation regime.: Yes Analysis of laboratory values and/or radiology reports with any subsequent need for medication adjustment and/or medical intervention. : Yes   I attest that I was present, lead the team conference, and concur with the assessment and plan of the  team.   Dorien Chihuahua B 04/30/2020, 1:27 PM

## 2020-05-01 ENCOUNTER — Encounter (HOSPITAL_COMMUNITY): Payer: Self-pay | Admitting: Physical Medicine & Rehabilitation

## 2020-05-01 DIAGNOSIS — R0989 Other specified symptoms and signs involving the circulatory and respiratory systems: Secondary | ICD-10-CM

## 2020-05-01 DIAGNOSIS — R0981 Nasal congestion: Secondary | ICD-10-CM | POA: Diagnosis not present

## 2020-05-01 DIAGNOSIS — I6310 Cerebral infarction due to embolism of unspecified precerebral artery: Secondary | ICD-10-CM | POA: Diagnosis not present

## 2020-05-01 DIAGNOSIS — I10 Essential (primary) hypertension: Secondary | ICD-10-CM | POA: Diagnosis not present

## 2020-05-01 DIAGNOSIS — S72145D Nondisplaced intertrochanteric fracture of left femur, subsequent encounter for closed fracture with routine healing: Secondary | ICD-10-CM | POA: Diagnosis not present

## 2020-05-01 DIAGNOSIS — N1832 Chronic kidney disease, stage 3b: Secondary | ICD-10-CM

## 2020-05-01 LAB — PROTIME-INR
INR: 1.9 — ABNORMAL HIGH (ref 0.8–1.2)
Prothrombin Time: 21.1 seconds — ABNORMAL HIGH (ref 11.4–15.2)

## 2020-05-01 LAB — METHYLMALONIC ACID, SERUM: Methylmalonic Acid, Quantitative: 202 nmol/L (ref 0–378)

## 2020-05-01 MED ORDER — WARFARIN SODIUM 7.5 MG PO TABS
7.5000 mg | ORAL_TABLET | Freq: Once | ORAL | Status: AC
  Administered 2020-05-01: 7.5 mg via ORAL
  Filled 2020-05-01: qty 1

## 2020-05-01 NOTE — Progress Notes (Signed)
[place] PHYSICAL MEDICINE & REHABILITATION PROGRESS NOTE  Subjective/Complaints: Patient seen sitting up in bed this morning.  Husband at bedside.  She states she slept well overnight until she felt like she had to be repositioned.  ROS: Denies CP, SOB, N/V/D  Objective: Vital Signs: Blood pressure (!) 173/57, pulse 75, temperature 97.9 F (36.6 C), resp. rate 18, height 5\' 4"  (1.626 m), weight 85.5 kg, SpO2 94 %. No results found. Recent Labs    04/29/20 0106 04/30/20 0549  WBC 7.3 9.3  HGB 8.0* 8.4*  HCT 25.1* 25.4*  PLT 269 308   Recent Labs    04/30/20 0549  NA 139  K 3.8  CL 107  CO2 20*  GLUCOSE 123*  BUN 25*  CREATININE 1.14*  CALCIUM 8.4*    Intake/Output Summary (Last 24 hours) at 05/01/2020 1132 Last data filed at 05/01/2020 0700 Gross per 24 hour  Intake 720 ml  Output --  Net 720 ml     Pressure Injury 04/29/20 Buttocks Right Stage 2 -  Partial thickness loss of dermis presenting as a shallow open injury with a red, pink wound bed without slough. Appears to be a blister that had busted. (Active)  04/29/20 1625  Location: Buttocks  Location Orientation: Right  Staging: Stage 2 -  Partial thickness loss of dermis presenting as a shallow open injury with a red, pink wound bed without slough.  Wound Description (Comments): Appears to be a blister that had busted.  Present on Admission: Yes    Physical Exam: BP (!) 173/57 (BP Location: Right Arm)   Pulse 75   Temp 97.9 F (36.6 C)   Resp 18   Ht 5\' 4"  (1.626 m)   Wt 85.5 kg   SpO2 94%   BMI 32.35 kg/m  Constitutional: No distress . Vital signs reviewed. HENT: Normocephalic.  Atraumatic. Eyes: EOMI. No discharge. Cardiovascular: No JVD.  RRR. Respiratory: Normal effort.  No stridor.  Bilateral clear to auscultation. GI: Non-distended.  BS +. Skin: Warm and dry.   Left hip CDI Psych: Somewhat flat.  Normal behavior. Musc: Left hip with edema and tenderness. Neuro: Alert Motor: B/l UE: 4/5  proximal distal Right lower extremity: Hip flexion, knee extension 1+/5, ankle dorsiflexion 4/5 (some pain inhibition) Left extremity: Hip flexion, knee extension 1/5, ankle dorsiflexion 3/5 (some pain inhibition), unchanged Very HOH Right facial weakness  Assessment/Plan: 1. Functional deficits which require 3+ hours per day of interdisciplinary therapy in a comprehensive inpatient rehab setting.  Physiatrist is providing close team supervision and 24 hour management of active medical problems listed below.  Physiatrist and rehab team continue to assess barriers to discharge/monitor patient progress toward functional and medical goals   Care Tool:  Bathing    Body parts bathed by patient: Right arm,Left arm,Chest,Abdomen,Right upper leg,Left upper leg,Face   Body parts bathed by helper: Buttocks,Front perineal area,Right lower leg,Left lower leg     Bathing assist Assist Level: Moderate Assistance - Patient 50 - 74%     Upper Body Dressing/Undressing Upper body dressing   What is the patient wearing?: Pull over shirt    Upper body assist Assist Level: Maximal Assistance - Patient 25 - 49%    Lower Body Dressing/Undressing Lower body dressing      What is the patient wearing?: Incontinence brief,Pants     Lower body assist Assist for lower body dressing: Dependent - Patient 0%     Toileting Toileting    Toileting assist Assist for toileting: 2  Helpers     Transfers Chair/bed transfer  Transfers assist     Chair/bed transfer assist level: 2 Helpers     Locomotion Ambulation   Ambulation assist   Ambulation activity did not occur: Safety/medical concerns    Assistive device: Other (comment) (Stedy to get on [facility])     Walk 10 feet activity   Assist  Walk 10 feet activity did not occur: Safety/medical concerns        Walk 50 feet activity   Assist Walk 50 feet with 2 turns activity did not occur: Safety/medical concerns         Walk  150 feet activity   Assist Walk 150 feet activity did not occur: Safety/medical concerns         Walk 10 feet on uneven surface  activity   Assist Walk 10 feet on uneven surfaces activity did not occur: Safety/medical concerns         Wheelchair     Assist Will patient use wheelchair at discharge?: No Type of Wheelchair: Manual    Wheelchair assist level: Minimal Assistance - Patient > 75% Max wheelchair distance: 23ft    Wheelchair 50 feet with 2 turns activity    Assist    Wheelchair 50 feet with 2 turns activity did not occur: Safety/medical concerns       Wheelchair 150 feet activity     Assist  Wheelchair 150 feet activity did not occur: Safety/medical concerns        Medical Problem List and Plan: 1.  Decreased functional ability secondary to left intertrochanteric femur fracture as well as history of left femur fracture May 2019 status post IM nailing as well as removal of hardware left femur from previous fracture 04/23/2020.  Weightbearing as tolerated.  Hospital course complicated by postoperative small acute infarct right cerebellum, bilateral occipital parietal lobes in the high left precentral gyrus.  Continue CIR 2.  Antithrombotics: -DVT/anticoagulation: Chronic Coumadin             -antiplatelet therapy: Continue aspirin 325 mg daily until INR greater than 2.00 and then discontinue. 3. Pain Management: Oxycodone as needed, Robaxin as needed  See #17  Relatively controlled with meds on 2/3  Monitor with increased exertion 4. Mood: Provide emotional support             -antipsychotic agents: N/A 5. Neuropsych: This patient is ?fully capable of making decisions on her own behalf. 6.  Acute blood loss anemia.    Hemoglobin 8.4 on 2/2  Continue to monitor  7.  Atrial fibrillation.  Toprol-XL 25 mg daily.  Continue Coumadin.  Cardiac rate controlled  Controlled on 2/3  Monitor with increased activity 8. Hypertension: Chlorthalidone  losartan currently on hold.  Continue Toprol  Labile on 2/2, will consider restarting medications tomorrow if persistent  Monitor increase mobility 9.  CKD stage III.  Baseline creatinine ?1.46.    Creatinine 1.14 on 2/2  Continue to monitor 10.  Hyperlipidemia: Lipitor 11. Recurrent UTIs.  Latest urinalysis study negative.   12. Skin/Wound Care: Routine skin checks 13. Fluids/Electrolytes/Nutrition: Routine in and outs 14. Very hard of hearing- talk to her LEFT ear.  15.  Chronic diarrhea: used Imodium 2 mg BID daily at home  16. Leg length discrepancy- L shorter than Right- husband to bring in appropriate lift shoes.  17. L shoulder bicipital tendinitis based on clinical exam  Voltaren gel 2G QID 18.  Congestion  Flonase ordered  ,?  Improving  LOS: 2  days A FACE TO FACE EVALUATION WAS PERFORMED  Miesha Bachmann Lorie Phenix 05/01/2020, 11:32 AM

## 2020-05-01 NOTE — Progress Notes (Signed)
ANTICOAGULATION CONSULT NOTE - Follow Up Consult  Pharmacy Consult for Warfarin Indication: atrial fibrillation  Allergies  Allergen Reactions  . Pradaxa [Dabigatran Etexilate Mesylate] Other (See Comments)    INTERNAL BLEEDING  . Clindamycin/Lincomycin Other (See Comments)    PT STATES HER DOCTOR TOLD HER NOT TO TAKE CLINDAMYCIN BECAUSE SHE GOT C-DIFF AFTER TAKING AMPICILLIN- "tolerated in 2022" (per spouse)  . Latex Other (See Comments)    Blisters   . Ampicillin Other (See Comments)    C-DIF AFTER TAKING AMPICILLIN- "tolerated in 2022" (per husband) Has patient had a PCN reaction causing immediate rash, facial/tongue/throat swelling, SOB or lightheadedness with hypotension: No Has patient had a PCN reaction causing severe rash involving mucus membranes or skin necrosis: No Has patient had a PCN reaction that required hospitalization: No Has patient had a PCN reaction occurring within the last 10 years: No If all of the above answers are "NO", then may proceed with Cephalosporin u  . Sulfamethoxazole Diarrhea    Patient Measurements: Height: 5\' 4"  (162.6 cm) Weight: 85.5 kg (188 lb 7.9 oz) IBW/kg (Calculated) : 54.7  Vital Signs: Temp: 97.9 F (36.6 C) (02/03 0415) BP: 173/57 (02/03 0415) Pulse Rate: 75 (02/03 0415)  Labs: Recent Labs    04/28/20 1959 04/29/20 0106 04/30/20 0549 05/01/20 0504  HGB 8.7* 8.0* 8.4*  --   HCT 27.4* 25.1* 25.4*  --   PLT  --  269 308  --   LABPROT  --  18.2* 20.0* 21.1*  INR  --  1.6* 1.8* 1.9*  CREATININE  --   --  1.14*  --     Estimated Creatinine Clearance: 38.9 mL/min (A) (by C-G formula based on SCr of 1.14 mg/dL (H)).   Medical History: Past Medical History:  Diagnosis Date  . Arthritis    "fingers, hips, feet, ankles" (11/10/2012)  . Asthma   . Atrial fibrillation (HCC)    CHRONIC COUMADIN  . Breast cancer ([place]) 1990's   "cancer on one side; precancerous tissue on the other" (11/10/2012)  . Chronic bronchitis ([place])     "multiple times; not in a long time" (11/10/2012  . Depression   . GERD (gastroesophageal reflux disease)   . Hearing impaired   . Hypertension   . Irritable bowel   . Lymphedema    HX OF - IN LEFT ARM--NO NEEDLES OR B/P'S LEFT ARM  . Macular degeneration    "BEGINNINGS OF" MACULAR DEGENERATION  . Osteoporosis   . Pneumonia    "multiple times; not in a long time" (11/10/2012)  . Recurrent UTI   . Sleep apnea    "dx'd w/it; don't wear mask or anything" (11/10/2012)  . Stroke ([place]) ~ 2005   HX OF TIA-NO RESIDUAL PROBLEM--PARALYSIS RT SIDE FACE /PT'S MOUTH DROOPS-AND LOSS OF HEARING RT EAR --SINCE EAR SURGERY AS A CHILD   . UTI (lower urinary tract infection)    FREQUENT    Assessment: 87 year old with PMH significant for PAF on coumadin, recurrent UTI, HTN, HLD admitted to [facility] 04/22/2020 with acute closed left intratrochanteric hip fracture.Patient developed worsening dysphagia, MRI was obtain which showed acute stroke. Warfarin held per neurology recommendation. Pharmacy consulted to restart Warfarin on 1/31 - per neurology no bridging except aspirin until INR of 2.  Home warfarin regimen -Take 5 mg by mouth with supper on Sun/Mon/Wed/Fri/Sat and 7.5 mg on Tues/Thurs.  Today, INR 1.9 is still slightly subtherapeutic. Hgb 8.4. Plt wnl. No reported bleeding.   Goal of Therapy:  INR 2-3   Plan:  Warfarin 7.5 mg x1  Monitor daily INR and CBC Monitor s/s of bleeding   [place] Crane, Roylene Reason, [facility] Clinical Pharmacist  05/01/2020 9:12 AM   [facility] pharmacy phone numbers are listed on [email or web address]

## 2020-05-01 NOTE — Care Management (Signed)
Inpatient [place] Individual Statement of Services  Patient Name:  [name]  Date:  05/01/2020  Welcome to the [facility].  Our goal is to provide you with an individualized program based on your diagnosis and situation, designed to meet your specific needs.  With this comprehensive rehabilitation program, you will be expected to participate in at least 3 hours of rehabilitation therapies Monday-Friday, with modified therapy programming on the weekends.  Your rehabilitation program will include the following services:  Physical Therapy (PT), Occupational Therapy (OT), Speech Therapy (ST), 24 hour per day rehabilitation nursing, Therapeutic Recreaction (TR), Psychology, Neuropsychology, Care Coordinator, Rehabilitation Medicine, Nutrition Services, Pharmacy Services and Other  Weekly team conferences will be held on Wednesdays to discuss your progress.  Your Inpatient Rehabilitation Care Coordinator will talk with you frequently to get your input and to update you on team discussions.  Team conferences with you and your family in attendance may also be held.  Expected length of stay: 16-18 Days  Overall anticipated outcome: Minimal Assistance   Depending on your progress and recovery, your program may change. Your Inpatient Rehabilitation Care Coordinator will coordinate services and will keep you informed of any changes. Your Inpatient Rehabilitation Care Coordinator's name and contact numbers are listed  below.  The following services may also be recommended but are not provided by the [place] will be made to provide these services after discharge if needed.  Arrangements include referral to agencies that provide these services.  Your insurance has been verified to be:  Medicare A/B  Your  primary doctor is:  Leanna Battles  Pertinent information will be shared with your doctor and your insurance company.  Inpatient Rehabilitation Care Coordinator:  Cathleen Corti [phone] or ([phone]  Information discussed with and copy given to patient by: Rana Snare, 05/01/2020, 12:16 PM

## 2020-05-01 NOTE — Progress Notes (Signed)
Speech Language Pathology Daily Session Note  Patient Details  Name: [name] MRN: [id number] Date of Birth: October 19, 1933  Today's Date: 05/01/2020 SLP Individual Time: 3086-5784 SLP Individual Time Calculation (min): 45 min  Short Term Goals: Week 1: SLP Short Term Goal 1 (Week 1): Patient will particpiate in completing cognitive-linguistic testing. SLP Short Term Goal 2 (Week 1): Patient will demonstrate anticipatory awareness to deficits with minA cues SLP Short Term Goal 3 (Week 1): Patient will perform ADL's/functional tasks with supervision A.  Skilled Therapeutic Interventions:   Patient seen for skilled ST session focusing on cognitive goals but also with SLP assessing her swallow function via BSE. Patient and spouse continue to report patient having difficulty swallowing and although they were mainly thinking it was the food being dry, today husband is telling SLP that patient was also coughing on liquids. Patient did not exhibit any coughing or immediate s/s aspiration or penetration but she did exhibit some delayed throat clearing. Patient reported belching as well but this was not observed today. SLP observed patient with portion of meal, and no overt s/s aspiration or penetration was observed. SLP continues to suspect GERD exacerbation but will follow patient briefly for swallow function. Patient also participated in completing cognitive testing sections from ALFA and for Reading Directions subtest, she received a score of 40%. Patient continues to benefit from skilled SLP intervention to maximize cognitive-linguistic function prior to discharge.  Pain Pain Assessment Pain Scale: 0-10 Pain Score: 0-No pain  Therapy/Group: Individual Therapy  Sonia Baller, MA, CCC-SLP Speech Therapy

## 2020-05-01 NOTE — Progress Notes (Signed)
Physical Therapy Session Note  Patient Details  Name: [name] MRN: [id number] Date of Birth: March 20, 1934  Today's Date: 05/01/2020 PT Individual Time: 0920-1030 + 1500-1525 PT Individual Time Calculation (min): 70 min  + 25 min  Short Term Goals: Week 1:  PT Short Term Goal 1 (Week 1): Pt will complete bed mobility with maxA PT Short Term Goal 2 (Week 1): Pt will complete bed<>chair transfers with maxA and LRAD PT Short Term Goal 3 (Week 1): Pt will initiate gait training with LRAD  Skilled Therapeutic Interventions/Progress Updates:     1st session: Pt received sitting upright in w/c, husband at bedside, pt agreeable to therapy but reluctant due to L leg pain. Husband reports she had recently been provided pain medication. Pt requiring frequent rest breaks during session for pain management/relief.  W/c transport for energy conservation to main therapy gym and wheeled inside // bars. Performed gentle AAROM for LLE with LAQ to promote tolerance to movement. She's unable to complete full AROM due to pain. Performed sit<>stand with modA in // bars with BUE pulling from bars. She was able to maintain standing for ~1-2 minutes prior to fatigue/worsening L leg pain. On the 2nd effort, she was able to stand up with minA while again pulling up from // bar. This time, she was able to maintain standing for ~4-5 minutes while therapy provided verbal distraction via conversation. Required TC/VC for upright posture due to her kyphotic nature and forward flexed trunk. Performed 3rd standing trial with min/modA while pulling up from // bar and able to stand for ~2 minutes. Performed pre-gait training including primarily lateral weight shifts and she is unable to tolerate forward or lateral stepping due to pain weight weight bearing.   Stedy transfer from w/c with maxA for rising. Able to stand with CGA from perched postion in [place] and transferred to mat table table with dependant assist in [place]. She was  able to sit edge of mat with supervision while unsupported. She required max/totalA for sit>supine with large wedge behind her shoulders due to her kyphotic spine. Performed the following supine there-ex: -1x20 ankle pumps -1x15 RLE quad sets -1x10 AAROM SAQ with triangle bolster under knees  Supine<>sit with maxA for LE and trunk management, continues to show significant pain inhibition. Required mat table raised to perform sit<>stand in [place] with maxA and stedy transfer back to her w/c. Able to stand from perched position in [place] with CGA but required modA for controlled lowering to w/c. W/c transport back to her room where she remained seated in w/c at end of session with husband at bedside and needs within reach. Husband updated on pt's status and progressions with therapy.  2nd session: Pt received supine in bed, husband at bedside, pt agreeable to therapy but requesting bed level due to fatigue from busy day of therapies. Reports no resting pain in L leg but pain evolves with supine there-ex. Rest breaks, distractions, and emotional support provided for pain management. Performed the following supine there-ex in bed: -1x20 ankle pumps -2x10 quad sets -2x10 AAROM heel slides  -2x10 AAROM hip abduction -1x10 glut sets  Encouraged patient to participate in these exercises outside of therapy. She has a handout at the bedside. Ended session supine in bed, with bed alarm on, made comfortable with needs in reach.  Therapy Documentation Precautions:  Precautions Precautions: Fall Restrictions Weight Bearing Restrictions: Yes LLE Weight Bearing: Weight bearing as tolerated  Therapy/Group: Individual Therapy  Koray Soter P Nareh Matzke PT 05/01/2020, 7:37 AM

## 2020-05-01 NOTE — Progress Notes (Signed)
Occupational Therapy Session Note  Patient Details  Name: [name] MRN: [id number] Date of Birth: 04/19/1933  Today's Date: 05/01/2020 OT Individual Time: 1400-1500 OT Individual Time Calculation (min): 60 min    Short Term Goals: Week 1:  OT Short Term Goal 1 (Week 1): Pt will be able to rise to stand with RW with max A of 1 to prepare for toileting. OT Short Term Goal 2 (Week 1): Pt will be able to stand with RW and release 1 hand for clothing management with max A. OT Short Term Goal 3 (Week 1): Pt will be able to stand pivot with RW with max A of 1. OT Short Term Goal 4 (Week 1): Pt will demonstrate improved processing by donning shirt with min cues.  Skilled Therapeutic Interventions/Progress Updates:    Pt sitting up in w/c, reports mild pain only in LLE, agreeable to OT session.  Assessed pts BUE strength and noted 3+/5 weakness in bilateral gross grip.  Issued pt resistive foam and instructed pt on grip exercise bilateral hands to complete 10 minutes/twice per day.  Pt completed with mod I.  Pt requesting to use bathroom.  Max assist +2 sit to stand using stedy from w/c.  Pt able to maintain static sitting on perch of stedy during transport to bathroom. Pt pulled pants and brief down over hips with mod assist.  Noted pt had urinary soiling of brief, therefore doffed with total assist.  Mod assist stand to sit at 3 in 1 commode over toilet with TCs to promote safe hand placement.  Pt had continent episode of urination and able to complete frontal peri cleansing with setup.  Pt required max assist to stand at stedy to bathe buttocks. Pt then reporting feeling the need to have bowel movement and required mod assist to return to sitting.  Pt did have continent episode of bowel.  Max assist sit to stand and and total assist to perform pericare/clothing mgt once again.  Pt requesting back to bed at end of session.  Dependent Stedy transport to EOB and stand to sit with mod assist.  Total assist  +2 sit to supine and reposition towards HOB.  Call bell in reach, bed alarm on at end of session.  Therapy Documentation Precautions:  Precautions Precautions: Fall Restrictions Weight Bearing Restrictions: Yes LUE Weight Bearing: Weight bearing as tolerated LLE Weight Bearing: Weight bearing as tolerated   Therapy/Group: Individual Therapy  Ezekiel Slocumb 05/01/2020, 5:25 PM

## 2020-05-02 DIAGNOSIS — D638 Anemia in other chronic diseases classified elsewhere: Secondary | ICD-10-CM

## 2020-05-02 DIAGNOSIS — I10 Essential (primary) hypertension: Secondary | ICD-10-CM

## 2020-05-02 LAB — PROTIME-INR
INR: 2.1 — ABNORMAL HIGH (ref 0.8–1.2)
Prothrombin Time: 22.9 seconds — ABNORMAL HIGH (ref 11.4–15.2)

## 2020-05-02 MED ORDER — LOSARTAN POTASSIUM 50 MG PO TABS
25.0000 mg | ORAL_TABLET | Freq: Every day | ORAL | Status: DC
  Administered 2020-05-02 – 2020-05-06 (×5): 25 mg via ORAL
  Filled 2020-05-02 (×5): qty 1

## 2020-05-02 MED ORDER — WARFARIN SODIUM 5 MG PO TABS
5.0000 mg | ORAL_TABLET | ORAL | Status: DC
  Administered 2020-05-02 – 2020-05-07 (×5): 5 mg via ORAL
  Filled 2020-05-02 (×5): qty 1

## 2020-05-02 MED ORDER — WARFARIN SODIUM 7.5 MG PO TABS
7.5000 mg | ORAL_TABLET | ORAL | Status: DC
  Administered 2020-05-06 – 2020-05-08 (×2): 7.5 mg via ORAL
  Filled 2020-05-02 (×2): qty 1

## 2020-05-02 NOTE — Progress Notes (Signed)
Occupational Therapy Session Note  Patient Details  Name: Lajada Janes MRN: [id number] Date of Birth: [birth year]  Today's Date: 05/02/2020 OT Individual Time: 1400-1500 OT Individual Time Calculation (min): 60 min    Short Term Goals: Week 1:  OT Short Term Goal 1 (Week 1): Pt will be able to rise to stand with RW with max A of 1 to prepare for toileting. OT Short Term Goal 2 (Week 1): Pt will be able to stand with RW and release 1 hand for clothing management with max A. OT Short Term Goal 3 (Week 1): Pt will be able to stand pivot with RW with max A of 1. OT Short Term Goal 4 (Week 1): Pt will demonstrate improved processing by donning shirt with min cues.  Skilled Therapeutic Interventions/Progress Updates:    Pt semiupright in bed, reports 3-4/10 pain LLE at rest, and 6-7/10 pain in LLE with mobility.  Agreeable to OT session.  Pt completed bed level AAROM LLE including heels slides and hip abd/adduction 2 x 5 reps each to increase soft tissue mobility prior to attempting transfer training.  Pt very limited ROM in both planes due to pain and stiffness.  Pt required total assist +2 supine to sit EOB from slightly elevated HOB position.  Pt required min assist +2 sit to stand at stedy.  Dependent transport using stedy EOB to w/c and CGA stand to sit.  Pt transported to gym and participated in sit<>stand, static standing balance, upright postural control using mirror for visual feedback, and right/left lateral weight shifting within parallel bars.  Pt required min assist +2 sit<>stand at parallel bars then CGA for all standing balance tasks.  Pt tolerated standing approximately 5 minutes before requiring seated rest break.  Pt transported back to room via w/c and completed sit<>stand at stedy with min assist +2, transport to EOB and CGA stand to sit EOB.  Total assist +2 sit to supine and reposition towards HOB.  Call bell in reach, bed alarm on.  Therapy Documentation Precautions:   Precautions Precautions: Fall Restrictions Weight Bearing Restrictions: Yes LUE Weight Bearing: Weight bearing as tolerated LLE Weight Bearing: Weight bearing as tolerated   Therapy/Group: Individual Therapy  Ezekiel Slocumb 05/02/2020, 4:14 PM

## 2020-05-02 NOTE — Plan of Care (Signed)
  Problem: Consults Goal: RH STROKE PATIENT EDUCATION Description: See Patient Education module for education specifics  Outcome: Progressing   Problem: RH BOWEL ELIMINATION Goal: RH STG MANAGE BOWEL WITH ASSISTANCE Description: STG Manage Bowel with min Assistance. Outcome: Progressing   Problem: RH BLADDER ELIMINATION Goal: RH STG MANAGE BLADDER WITH ASSISTANCE Description: STG Manage Bladder With min Assistance Outcome: Progressing   Problem: RH SKIN INTEGRITY Goal: RH STG MAINTAIN SKIN INTEGRITY WITH ASSISTANCE Description: STG Maintain Skin Integrity With min Assistance. Outcome: Progressing   Problem: RH SAFETY Goal: RH STG ADHERE TO SAFETY PRECAUTIONS W/ASSISTANCE/DEVICE Description: STG Adhere to Safety Precautions With Assistance/Device. Outcome: Progressing   Problem: RH PAIN MANAGEMENT Goal: RH STG PAIN MANAGED AT OR BELOW PT'S PAIN GOAL Outcome: Progressing   Problem: RH KNOWLEDGE DEFICIT Goal: RH STG INCREASE KNOWLEDGE OF HYPERTENSION Outcome: Progressing Goal: RH STG INCREASE KNOWLEGDE OF HYPERLIPIDEMIA Outcome: Progressing Goal: RH STG INCREASE KNOWLEDGE OF STROKE PROPHYLAXIS Outcome: Progressing   

## 2020-05-02 NOTE — Progress Notes (Signed)
Physical Therapy Session Note  Patient Details  Name: [name] MRN: [id number] Date of Birth: 1933/06/06  Today's Date: 05/02/2020 PT Individual Time: 0800-0857 PT Individual Time Calculation (min): 57 min   Short Term Goals: Week 1:  PT Short Term Goal 1 (Week 1): Pt will complete bed mobility with maxA PT Short Term Goal 2 (Week 1): Pt will complete bed<>chair transfers with maxA and LRAD PT Short Term Goal 3 (Week 1): Pt will initiate gait training with LRAD  Skilled Therapeutic Interventions/Progress Updates:    Pt received supine in bed, just finishing up her breakfast, agreeable to therapy but reports high levels of L pain (related to fx) and reports she has not received any pain or routine morning medication. Call bell used to notify RN. Rest breaks, distraction, and emotional support provided during session for pain management. Pt reports needing to void and wanting to get dressed. Donned TED's and socks with totalA while supine. Required maxA for supine<>sit with HOB nearly fully elevated, extra time needed due to L leg pain. Requires totalA for LLE management to complete this. Once sitting EOB, able to sit with CGA but notablly favors her RLE with limited weight shift to L. Sit<>stand with maxA from raised EOB height to [place], requires mostly assist for producing power to rise. Stedy transfer to the 3-[facility] over the toilet in her bathroom. Able to stand from perched position with CGA in [place] and required minA for controlled lowering to the 3-1 with heavy reliance with BUE's to [place]. Pt unable to void despite time provided but noted a very saturated brief, incontinent of bladder. RN was notified. Required maxA for standing from 3-1 BSC in [place] and she was transferred back to the EOB. RN arriving thereafter for her morning medications, including pain med's. She required totalA for donning a new brief and pants while seated EOB. Sit<>Stand with modA from EOB to Stedy to pull pants over  hips, requiring totalA. Also required totalA for donning button-up shirt. Pt requesting to brush her teeth so she was wheeled in [place] in perched position to the sink where she completed this with setupA.  Sit<>stand with CGA in Stedy from perched positoin and modA for controlled lowering back to her w/c. She ended session seated in w/c with needs in reach, made comfortable, husband at bedside.  Therapy Documentation Precautions:  Precautions Precautions: Fall Restrictions Weight Bearing Restrictions: Yes LUE Weight Bearing: Weight bearing as tolerated LLE Weight Bearing: Weight bearing as tolerated  Therapy/Group: Individual Therapy  Tersa Fotopoulos P Cori Henningsen PT 05/02/2020, 7:38 AM

## 2020-05-02 NOTE — Progress Notes (Signed)
Occupational Therapy Session Note  Patient Details  Name: [name] MRN: [id number] Date of Birth: [birth year]  Today's Date: 05/02/2020 OT Individual Time: 0930-1015 OT Individual Time Calculation (min): 45 min    Short Term Goals: Week 1:  OT Short Term Goal 1 (Week 1): Pt will be able to rise to stand with RW with max A of 1 to prepare for toileting. OT Short Term Goal 2 (Week 1): Pt will be able to stand with RW and release 1 hand for clothing management with max A. OT Short Term Goal 3 (Week 1): Pt will be able to stand pivot with RW with max A of 1. OT Short Term Goal 4 (Week 1): Pt will demonstrate improved processing by donning shirt with min cues.  Skilled Therapeutic Interventions/Progress Updates:    Pt in wc dressed and ready to go for therapy.  Pt taken to gym to work on standing tolerance and advancing to taking steps in parallel bars. +2 A for support, but actually pt did very well pulling up in bars with only min A and was able to achieve a more upright posture.  Tolerated standing for 1-2 minutes at a time.  Added on standing and taking 10 steps forward and back with R leg to develop L wt bearing tolerance.  10 with L.  Pt was concerned she would not be able to move L leg but actually did well.  Rested and repeated cycle 3 x.  Then stood and had pt take 4 small steps forward and 4 back, rest and repeated.  Her steps were very small but she was advancing her feet. Pt very pleased with progress.   Pt returned to room with spouse in the room with pt.   Therapy Documentation Precautions:  Precautions Precautions: Fall Restrictions Weight Bearing Restrictions: Yes LUE Weight Bearing: Weight bearing as tolerated LLE Weight Bearing: Weight bearing as tolerated  Pain: Pain Assessment Pain Score: 2  - pt premedicated   Therapy/Group: Individual Therapy  [place] 05/02/2020, 1:29 PM

## 2020-05-02 NOTE — Progress Notes (Signed)
Speech Language Pathology Daily Session Note  Patient Details  Name: [name] MRN: [id number] Date of Birth: 10-07-1933  Today's Date: 05/02/2020 SLP Individual Time: 1325-1335 SLP Individual Time Calculation (min): 10 min  Short Term Goals: Week 1: SLP Short Term Goal 1 (Week 1): Patient will particpiate in completing cognitive-linguistic testing. SLP Short Term Goal 2 (Week 1): Patient will demonstrate anticipatory awareness to deficits with minA cues SLP Short Term Goal 3 (Week 1): Patient will perform ADL's/functional tasks with supervision A.  Skilled Therapeutic Interventions:  Patient seen for skilled ST session focusing on cognitive goals, however session was very brief as patient had been receiving NT care for toileting for majority of scheduled time. Patient's spouse reported that she took two steps and patient confirmed, "two steps forward, two steps back" but she felt good about that and it was "all I could do". Patient and spouse both report that she has not had any coughing episodes today. Spouse feels that patient is back to cognitive-linguistic baseline from stroke. SLP discussed with patient and spouse plan to have SLP finish cognitive testing and likely discharge from [facility] services. Patient continues to benefit from skilled SLP intervention to maximize cognitive functioning prior to discharge.  Pain Pain Assessment Pain Scale: 0-10 Pain Score: 0-No pain  Therapy/Group: Individual Therapy  Sonia Baller, MA, CCC-SLP Speech Therapy

## 2020-05-02 NOTE — Progress Notes (Signed)
[facility] PHYSICAL MEDICINE & REHABILITATION PROGRESS NOTE  Subjective/Complaints: Patient seen sitting up at the edge of the bed working with therapy this morning.  She states she slept well overnight.  She states she is upset with the way things went this morning, referring to her food.  Husband asks me to come back at a different time her therapies are not interrupted.  ROS: Denies CP, SOB, N/V/D  Objective: Vital Signs: Blood pressure (!) 177/60, pulse 72, temperature 98.5 F (36.9 C), resp. rate 18, height 5\' 4"  (1.626 m), weight 85 kg, SpO2 95 %. No results found. Recent Labs    04/30/20 0549  WBC 9.3  HGB 8.4*  HCT 25.4*  PLT 308   Recent Labs    04/30/20 0549  NA 139  K 3.8  CL 107  CO2 20*  GLUCOSE 123*  BUN 25*  CREATININE 1.14*  CALCIUM 8.4*    Intake/Output Summary (Last 24 hours) at 05/02/2020 1322 Last data filed at 05/02/2020 0855 Gross per 24 hour  Intake 440 ml  Output --  Net 440 ml     Pressure Injury 04/29/20 Buttocks Right Stage 2 -  Partial thickness loss of dermis presenting as a shallow open injury with a red, pink wound bed without slough. Appears to be a blister that had busted. (Active)  04/29/20 1625  Location: Buttocks  Location Orientation: Right  Staging: Stage 2 -  Partial thickness loss of dermis presenting as a shallow open injury with a red, pink wound bed without slough.  Wound Description (Comments): Appears to be a blister that had busted.  Present on Admission: Yes    Physical Exam: BP (!) 177/60 (BP Location: Left Arm)   Pulse 72   Temp 98.5 F (36.9 C)   Resp 18   Ht 5\' 4"  (1.626 m)   Wt 85 kg   SpO2 95%   BMI 32.17 kg/m  Constitutional: No distress . Vital signs reviewed. HENT: Normocephalic.  Atraumatic. Eyes: EOMI. No discharge. Cardiovascular: No JVD.  RRR. Respiratory: Normal effort.  No stridor.  Bilateral clear to auscultation. GI: Non-distended.  BS +. Skin: Warm and dry.   Left hip CDI Psych: Flat and  agitated. Musc: Left hip with edema and tenderness. Neuro: Alert Motor: B/l UE: 4/5 proximal distal Right lower extremity: Hip flexion, knee extension 2+-3 -/5, ankle dorsiflexion 4/5 (some pain inhibition) Left extremity: Hip flexion, knee extension 1/5, ankle dorsiflexion 3/5 (some pain inhibition), persistent Very HOH Right facial weakness  Assessment/Plan: 1. Functional deficits which require 3+ hours per day of interdisciplinary therapy in a comprehensive inpatient rehab setting.  Physiatrist is providing close team supervision and 24 hour management of active medical problems listed below.  Physiatrist and rehab team continue to assess barriers to discharge/monitor patient progress toward functional and medical goals   Care Tool:  Bathing    Body parts bathed by patient: Right arm,Left arm,Chest,Abdomen,Right upper leg,Left upper leg,Face   Body parts bathed by helper: Buttocks,Front perineal area,Right lower leg,Left lower leg     Bathing assist Assist Level: Moderate Assistance - Patient 50 - 74%     Upper Body Dressing/Undressing Upper body dressing   What is the patient wearing?: Pull over shirt    Upper body assist Assist Level: Maximal Assistance - Patient 25 - 49%    Lower Body Dressing/Undressing Lower body dressing      What is the patient wearing?: Incontinence brief,Pants     Lower body assist Assist for lower body  dressing: Dependent - Patient 0%     Toileting Toileting    Toileting assist Assist for toileting: Total Assistance - Patient < 25%     Transfers Chair/bed transfer  Transfers assist     Chair/bed transfer assist level: 2 Helpers     Locomotion Ambulation   Ambulation assist   Ambulation activity did not occur: Safety/medical concerns    Assistive device: Other (comment) (Stedy to get on [facility])     Walk 10 feet activity   Assist  Walk 10 feet activity did not occur: Safety/medical concerns        Walk 50 feet  activity   Assist Walk 50 feet with 2 turns activity did not occur: Safety/medical concerns         Walk 150 feet activity   Assist Walk 150 feet activity did not occur: Safety/medical concerns         Walk 10 feet on uneven surface  activity   Assist Walk 10 feet on uneven surfaces activity did not occur: Safety/medical concerns         Wheelchair     Assist Will patient use wheelchair at discharge?: Yes (Per PT long term goals) Type of Wheelchair: Manual    Wheelchair assist level: Minimal Assistance - Patient > 75% Max wheelchair distance: 42ft    Wheelchair 50 feet with 2 turns activity    Assist        Assist Level: Total Assistance - Patient < 25%   Wheelchair 150 feet activity     Assist      Assist Level: Total Assistance - Patient < 25%    Medical Problem List and Plan: 1.  Decreased functional ability secondary to left intertrochanteric femur fracture as well as history of left femur fracture May 2019 status post IM nailing as well as removal of hardware left femur from previous fracture 04/23/2020.  Weightbearing as tolerated.  Hospital course complicated by postoperative small acute infarct right cerebellum, bilateral occipital parietal lobes in the high left precentral gyrus.  Continue CIR 2.  Antithrombotics: -DVT/anticoagulation: Chronic Coumadin             -antiplatelet therapy: DC aspirin now that Coumadin therapeutic 3. Pain Management: Oxycodone as needed, Robaxin as needed  See #17  Relatively controlled with meds on 2/4  Monitor with increased exertion 4. Mood: Provide emotional support             -antipsychotic agents: N/A 5. Neuropsych: This patient is ?fully capable of making decisions on her own behalf. 6.  Acute blood loss anemia.    Hemoglobin 8.4 on 2/2  Continue to monitor  7.  Atrial fibrillation.  Toprol-XL 25 mg daily.  Continue Coumadin.  Cardiac rate controlled  Controlled on 2/4  Monitor with  increased activity 8. Hypertension: Chlorthalidone losartan currently on hold.  Continue Toprol  Losartan 25 started at bedtime due to elevated blood pressure requiring overnight  Monitor increase mobility 9.  CKD stage III.  Baseline creatinine ?1.46.    Creatinine 1.14 on 2/2, plan to repeat labs  Continue to monitor 10.  Hyperlipidemia: Lipitor 11. Recurrent UTIs.  Latest urinalysis study negative.   12. Skin/Wound Care: Routine skin checks 13. Fluids/Electrolytes/Nutrition: Routine in and outs 14. Very hard of hearing- talk to her LEFT ear.  15.  Chronic diarrhea: used Imodium 2 mg BID daily at home  16. Leg length discrepancy- L shorter than Right- husband to bring in appropriate lift shoes.  17. L shoulder bicipital  tendinitis based on clinical exam  Voltaren gel 2G QID 18.  Congestion  Flonase ordered  Appears to be improving 19.?  Chronic anemia  Hemoglobin 8.4 on 2/2, continue to monitor  LOS: 3 days A FACE TO FACE EVALUATION WAS PERFORMED  Mahathi Pokorney Lorie Phenix 05/02/2020, 1:22 PM

## 2020-05-02 NOTE — Progress Notes (Signed)
ANTICOAGULATION CONSULT NOTE - Follow Up Consult  Pharmacy Consult for Warfarin Indication: atrial fibrillation  Allergies  Allergen Reactions  . Pradaxa [Dabigatran Etexilate Mesylate] Other (See Comments)    INTERNAL BLEEDING  . Clindamycin/Lincomycin Other (See Comments)    PT STATES HER DOCTOR TOLD HER NOT TO TAKE CLINDAMYCIN BECAUSE SHE GOT C-DIFF AFTER TAKING AMPICILLIN- "tolerated in 2022" (per spouse)  . Latex Other (See Comments)    Blisters   . Ampicillin Other (See Comments)    C-DIF AFTER TAKING AMPICILLIN- "tolerated in 2022" (per husband) Has patient had a PCN reaction causing immediate rash, facial/tongue/throat swelling, SOB or lightheadedness with hypotension: No Has patient had a PCN reaction causing severe rash involving mucus membranes or skin necrosis: No Has patient had a PCN reaction that required hospitalization: No Has patient had a PCN reaction occurring within the last 10 years: No If all of the above answers are "NO", then may proceed with Cephalosporin u  . Sulfamethoxazole Diarrhea    Patient Measurements: Height: 5\' 4"  (162.6 cm) Weight: 85 kg (187 lb 6.3 oz) IBW/kg (Calculated) : 54.7  Vital Signs: Temp: 98.5 F (36.9 C) (02/04 0503) BP: 177/60 (02/04 0503) Pulse Rate: 72 (02/04 0503)  Labs: Recent Labs    04/30/20 0549 05/01/20 0504 05/02/20 0543  HGB 8.4*  --   --   HCT 25.4*  --   --   PLT 308  --   --   LABPROT 20.0* 21.1* 22.9*  INR 1.8* 1.9* 2.1*  CREATININE 1.14*  --   --     Estimated Creatinine Clearance: 38.7 mL/min (A) (by C-G formula based on SCr of 1.14 mg/dL (H)).   Medical History: Past Medical History:  Diagnosis Date  . Arthritis    "fingers, hips, feet, ankles" (11/10/2012)  . Asthma   . Atrial fibrillation (HCC)    CHRONIC COUMADIN  . Breast cancer ([place]) 1990's   "cancer on one side; precancerous tissue on the other" (11/10/2012)  . Chronic bronchitis ([place])    "multiple times; not in a long time"  (11/10/2012  . Depression   . GERD (gastroesophageal reflux disease)   . Hearing impaired   . Hypertension   . Irritable bowel   . Lymphedema    HX OF - IN LEFT ARM--NO NEEDLES OR B/P'S LEFT ARM  . Macular degeneration    "BEGINNINGS OF" MACULAR DEGENERATION  . Osteoporosis   . Pneumonia    "multiple times; not in a long time" (11/10/2012)  . Recurrent UTI   . Sleep apnea    "dx'd w/it; don't wear mask or anything" (11/10/2012)  . Stroke ([place]) ~ 2005   HX OF TIA-NO RESIDUAL PROBLEM--PARALYSIS RT SIDE FACE /PT'S MOUTH DROOPS-AND LOSS OF HEARING RT EAR --SINCE EAR SURGERY AS A CHILD   . UTI (lower urinary tract infection)    FREQUENT    Assessment: 87 year old with PMH significant for PAF on coumadin, recurrent UTI, HTN, HLD admitted to [facility] 04/22/2020 with acute closed left intratrochanteric hip fracture.Patient developed worsening dysphagia, MRI was obtain which showed acute stroke. Warfarin held per neurology recommendation. Pharmacy consulted to restart Warfarin on 1/31 - per neurology no bridging except aspirin until INR of 2.  Home warfarin regimen -Take 5 mg by mouth with supper on Sun/Mon/Wed/Fri/Sat and 7.5 mg on Tues/Thurs.  INR 2.1 today  Goal of Therapy:  INR 2-3   Plan:  DC Aspirin Resume home dose of warfarin  Continue daily INR   Thank you  Anette Guarneri, PharmD  05/02/2020 8:27 AM   [facility] pharmacy phone numbers are listed on [email or web address]

## 2020-05-03 LAB — PROTIME-INR
INR: 2.4 — ABNORMAL HIGH (ref 0.8–1.2)
Prothrombin Time: 25 seconds — ABNORMAL HIGH (ref 11.4–15.2)

## 2020-05-03 NOTE — Plan of Care (Signed)
  Problem: Consults Goal: RH STROKE PATIENT EDUCATION Description: See Patient Education module for education specifics  Outcome: Progressing   Problem: RH BOWEL ELIMINATION Goal: RH STG MANAGE BOWEL WITH ASSISTANCE Description: STG Manage Bowel with min Assistance. Outcome: Progressing   Problem: RH BLADDER ELIMINATION Goal: RH STG MANAGE BLADDER WITH ASSISTANCE Description: STG Manage Bladder With min Assistance Outcome: Progressing   Problem: RH SKIN INTEGRITY Goal: RH STG MAINTAIN SKIN INTEGRITY WITH ASSISTANCE Description: STG Maintain Skin Integrity With min Assistance. Outcome: Progressing   Problem: RH SAFETY Goal: RH STG ADHERE TO SAFETY PRECAUTIONS W/ASSISTANCE/DEVICE Description: STG Adhere to Safety Precautions With Assistance/Device. Outcome: Progressing   Problem: RH PAIN MANAGEMENT Goal: RH STG PAIN MANAGED AT OR BELOW PT'S PAIN GOAL Outcome: Progressing   Problem: RH KNOWLEDGE DEFICIT Goal: RH STG INCREASE KNOWLEDGE OF HYPERTENSION Outcome: Progressing Goal: RH STG INCREASE KNOWLEGDE OF HYPERLIPIDEMIA Outcome: Progressing Goal: RH STG INCREASE KNOWLEDGE OF STROKE PROPHYLAXIS Outcome: Progressing   

## 2020-05-03 NOTE — Progress Notes (Signed)
ANTICOAGULATION CONSULT NOTE - Follow Up Consult  Pharmacy Consult for Warfarin Indication: atrial fibrillation  Allergies  Allergen Reactions  . Pradaxa [Dabigatran Etexilate Mesylate] Other (See Comments)    INTERNAL BLEEDING  . Clindamycin/Lincomycin Other (See Comments)    PT STATES HER DOCTOR TOLD HER NOT TO TAKE CLINDAMYCIN BECAUSE SHE GOT C-DIFF AFTER TAKING AMPICILLIN- "tolerated in 2022" (per spouse)  . Latex Other (See Comments)    Blisters   . Ampicillin Other (See Comments)    C-DIF AFTER TAKING AMPICILLIN- "tolerated in 2022" (per husband) Has patient had a PCN reaction causing immediate rash, facial/tongue/throat swelling, SOB or lightheadedness with hypotension: No Has patient had a PCN reaction causing severe rash involving mucus membranes or skin necrosis: No Has patient had a PCN reaction that required hospitalization: No Has patient had a PCN reaction occurring within the last 10 years: No If all of the above answers are "NO", then may proceed with Cephalosporin u  . Sulfamethoxazole Diarrhea    Patient Measurements: Height: 5\' 4"  (162.6 cm) Weight: 88.6 kg (195 lb 5.2 oz) IBW/kg (Calculated) : 54.7  Vital Signs: Temp: 98 F (36.7 C) (02/05 0326) Temp Source: Oral (02/05 0326) BP: 156/58 (02/05 0326) Pulse Rate: 72 (02/05 0326)  Labs: Recent Labs    05/01/20 0504 05/02/20 0543 05/03/20 0507  LABPROT 21.1* 22.9* 25.0*  INR 1.9* 2.1* 2.4*    Estimated Creatinine Clearance: 39.6 mL/min (A) (by C-G formula based on SCr of 1.14 mg/dL (H)).   Medical History: Past Medical History:  Diagnosis Date  . Arthritis    "fingers, hips, feet, ankles" (11/10/2012)  . Asthma   . Atrial fibrillation (HCC)    CHRONIC COUMADIN  . Breast cancer ([place]) 1990's   "cancer on one side; precancerous tissue on the other" (11/10/2012)  . Chronic bronchitis ([place])    "multiple times; not in a long time" (11/10/2012  . Depression   . GERD (gastroesophageal reflux disease)    . Hearing impaired   . Hypertension   . Irritable bowel   . Lymphedema    HX OF - IN LEFT ARM--NO NEEDLES OR B/P'S LEFT ARM  . Macular degeneration    "BEGINNINGS OF" MACULAR DEGENERATION  . Osteoporosis   . Pneumonia    "multiple times; not in a long time" (11/10/2012)  . Recurrent UTI   . Sleep apnea    "dx'd w/it; don't wear mask or anything" (11/10/2012)  . Stroke ([place]) ~ 2005   HX OF TIA-NO RESIDUAL PROBLEM--PARALYSIS RT SIDE FACE /PT'S MOUTH DROOPS-AND LOSS OF HEARING RT EAR --SINCE EAR SURGERY AS A CHILD   . UTI (lower urinary tract infection)    FREQUENT    Assessment: 87 year old with PMH significant for PAF on coumadin, recurrent UTI, HTN, HLD admitted to [facility] 04/22/2020 with acute closed left intratrochanteric hip fracture.Patient developed worsening dysphagia, MRI was obtain which showed acute stroke. Warfarin held per neurology recommendation. Pharmacy consulted to restart Warfarin on 1/31 - per neurology no bridging except aspirin until INR of 2.  Home warfarin regimen -Take 5 mg by mouth with supper on Sun/Mon/Wed/Fri/Sat and 7.5 mg on Tues/Thurs.  INR therapeutic at 2.4 today.   Goal of Therapy:  INR 2-3  Plan:  Continue home warfarin regimen - due for 5 mg today Continue daily INR, monitor for s/s bleeding  Rebbeca Paul, PharmD PGY1 Pharmacy Resident 05/03/2020 9:57 AM  Please check [email or web address] for unit-specific pharmacy phone numbers.

## 2020-05-03 NOTE — Progress Notes (Signed)
Speech Language Pathology Daily Session Note  Patient Details  Name: [name] MRN: [id number] Date of Birth: [birth year]  Today's Date: 05/03/2020 SLP Individual Time: 1310-1345 SLP Individual Time Calculation (min): 35 min  Short Term Goals: Week 1: SLP Short Term Goal 1 (Week 1): Patient will particpiate in completing cognitive-linguistic testing. SLP Short Term Goal 2 (Week 1): Patient will demonstrate anticipatory awareness to deficits with minA cues SLP Short Term Goal 3 (Week 1): Patient will perform ADL's/functional tasks with supervision A.  Skilled Therapeutic Interventions: Further cognitive testing completed with ALFA. Pt was in bathroom for beginning of session therefore only received 35 min of treatment. She was able to state correct time for 8/10 times presented on clock. She required visual cues to placement of minute hand to realize errors in times she stated. Increased time, repetition and written coin amounts needed during coin counting subtest. She reported math and calculations are difficult for her at baseline. Moderate assistance with verbal direction required for daily math problems. Supervision to Min A needed for medication label subtest. Review medications and address anticipatory awareness next session. Cont with therapy per plan of care.   Pain Pain Assessment Pain Scale: Faces Faces Pain Scale: No hurt  Therapy/Group: Individual Therapy  Darrol Poke [place] Doughman 05/03/2020, 3:14 PM

## 2020-05-03 NOTE — Progress Notes (Signed)
Physical Therapy Session Note  Patient Details  Name: [name] MRN: [id number] Date of Birth: [birth year]  Today's Date: 05/03/2020 PT Individual Time: 5852-7782 PT Individual Time Calculation (min): 26 min   Short Term Goals: Week 1:  PT Short Term Goal 1 (Week 1): Pt will complete bed mobility with maxA PT Short Term Goal 2 (Week 1): Pt will complete bed<>chair transfers with maxA and LRAD PT Short Term Goal 3 (Week 1): Pt will initiate gait training with LRAD  Skilled Therapeutic Interventions/Progress Updates:  Pt was seen bedside in the pm. Pt fatigued but willing to participate. Pt transferred sit to stand in parallel bars with min to mod A and verbal cues. Pt ambulated 5 feet and 2 feet in parallel bars with min to mod A and second person w/c follow for safety.   Therapy Documentation Precautions:  Precautions Precautions: Fall Restrictions Weight Bearing Restrictions: Yes LUE Weight Bearing: Weight bearing as tolerated LLE Weight Bearing: Weight bearing as tolerated General:   Pain: No c/o pain.   Therapy/Group: Individual Therapy  Dub Amis 05/03/2020, 3:11 PM

## 2020-05-03 NOTE — Progress Notes (Signed)
Physical Therapy Session Note  Patient Details  Name: [name] MRN: [id number] Date of Birth: May 06, 1933  Today's Date: 05/03/2020 PT Individual Time: 1000-1055 PT Individual Time Calculation (min): 55 min   Short Term Goals: Week 1:  PT Short Term Goal 1 (Week 1): Pt will complete bed mobility with maxA PT Short Term Goal 2 (Week 1): Pt will complete bed<>chair transfers with maxA and LRAD PT Short Term Goal 3 (Week 1): Pt will initiate gait training with LRAD  Skilled Therapeutic Interventions/Progress Updates:  Pt was seen bedside in the am sitting up in w/c. Pt transported to rehab gym. Pt performed hip flex and LAQs, AAROM L LE and AROM R LE, 2 sets x 10 reps each. Pt stood multiple times in the parallel bars with min to mod A and verbal cues with increased time. Pt ambulated 5 feet x 2 and 3 feet x 3 with rolling walker and min to mod A with w/c follow for safety. Pt requires increased and decreased step length B LEs. Pt returned to room with husband at bedside and left sitting up in chair with all needs within reach.   Therapy Documentation Precautions:  Precautions Precautions: Fall Restrictions Weight Bearing Restrictions: Yes LUE Weight Bearing: Weight bearing as tolerated LLE Weight Bearing: Weight bearing as tolerated General:   Pain: No c/o pain.   Therapy/Group: Individual Therapy  Dub Amis 05/03/2020, 12:05 PM

## 2020-05-03 NOTE — Progress Notes (Signed)
Occupational Therapy Session Note  Patient Details  Name: [name] MRN: [id number] Date of Birth: June 03, 1933  Today's Date: 05/03/2020 OT Individual Time: 0900-1000 OT Individual Time Calculation (min): 60 min    Short Term Goals: Week 1:  OT Short Term Goal 1 (Week 1): Pt will be able to rise to stand with RW with max A of 1 to prepare for toileting. OT Short Term Goal 2 (Week 1): Pt will be able to stand with RW and release 1 hand for clothing management with max A. OT Short Term Goal 3 (Week 1): Pt will be able to stand pivot with RW with max A of 1. OT Short Term Goal 4 (Week 1): Pt will demonstrate improved processing by donning shirt with min cues.  Skilled Therapeutic Interventions/Progress Updates:    Pt sitting up EOB requesting to use bathroom.  Pt required dependent donning of shoes and completed sit to stand at stedy with CGA.  Pt transported to bathroom and completed toilet transfer at 3 in 1 commode using stedy with min assist for stand to sit.  Pt had continent episode of urine.  Pt stood at stedy with min assist and completed pericare and clothing mgt requiring max assist.  Noted pressure wound located over sacral region; nursing notified.  Pt transported to w/c using stedy and stand to sit with CGA.  Pt brushed teeth and washed face with setup.  UB dressing also completed with setup.  Pt bathed UB with min assist(for back only) and bathed/dressed LB with max assist at sit/stand level (min assist +2 for sit<>stand transfer).  Required frequent VCs to improve cervical and thoracic upright posture during standing tasks.  Pt sitting up in w/c, LLE elevated, call bell in reach.  Therapy Documentation Precautions:  Precautions Precautions: Fall Restrictions Weight Bearing Restrictions: Yes LUE Weight Bearing: Weight bearing as tolerated LLE Weight Bearing: Weight bearing as tolerated   Therapy/Group: Individual Therapy  Ezekiel Slocumb 05/03/2020, 3:50 PM

## 2020-05-04 LAB — PROTIME-INR
INR: 2.5 — ABNORMAL HIGH (ref 0.8–1.2)
Prothrombin Time: 26.1 seconds — ABNORMAL HIGH (ref 11.4–15.2)

## 2020-05-04 NOTE — Progress Notes (Signed)
ANTICOAGULATION CONSULT NOTE - Follow Up Consult  Pharmacy Consult for Warfarin Indication: atrial fibrillation  Allergies  Allergen Reactions  . Pradaxa [Dabigatran Etexilate Mesylate] Other (See Comments)    INTERNAL BLEEDING  . Clindamycin/Lincomycin Other (See Comments)    PT STATES HER DOCTOR TOLD HER NOT TO TAKE CLINDAMYCIN BECAUSE SHE GOT C-DIFF AFTER TAKING AMPICILLIN- "tolerated in 2022" (per spouse)  . Latex Other (See Comments)    Blisters   . Ampicillin Other (See Comments)    C-DIF AFTER TAKING AMPICILLIN- "tolerated in 2022" (per husband) Has patient had a PCN reaction causing immediate rash, facial/tongue/throat swelling, SOB or lightheadedness with hypotension: No Has patient had a PCN reaction causing severe rash involving mucus membranes or skin necrosis: No Has patient had a PCN reaction that required hospitalization: No Has patient had a PCN reaction occurring within the last 10 years: No If all of the above answers are "NO", then may proceed with Cephalosporin u  . Sulfamethoxazole Diarrhea    Patient Measurements: Height: 5\' 4"  (162.6 cm) Weight: 86.8 kg (191 lb 5.8 oz) IBW/kg (Calculated) : 54.7  Vital Signs: Temp: 98.3 F (36.8 C) (02/06 0303) Temp Source: Oral (02/06 0303) BP: 161/64 (02/06 0303) Pulse Rate: 73 (02/06 0303)  Labs: Recent Labs    05/02/20 0543 05/03/20 0507 05/04/20 0639  LABPROT 22.9* 25.0* 26.1*  INR 2.1* 2.4* 2.5*    Estimated Creatinine Clearance: 39.1 mL/min (A) (by C-G formula based on SCr of 1.14 mg/dL (H)).   Medical History: Past Medical History:  Diagnosis Date  . Arthritis    "fingers, hips, feet, ankles" (11/10/2012)  . Asthma   . Atrial fibrillation (HCC)    CHRONIC COUMADIN  . Breast cancer ([place]) 1990's   "cancer on one side; precancerous tissue on the other" (11/10/2012)  . Chronic bronchitis ([place])    "multiple times; not in a long time" (11/10/2012  . Depression   . GERD (gastroesophageal reflux  disease)   . Hearing impaired   . Hypertension   . Irritable bowel   . Lymphedema    HX OF - IN LEFT ARM--NO NEEDLES OR B/P'S LEFT ARM  . Macular degeneration    "BEGINNINGS OF" MACULAR DEGENERATION  . Osteoporosis   . Pneumonia    "multiple times; not in a long time" (11/10/2012)  . Recurrent UTI   . Sleep apnea    "dx'd w/it; don't wear mask or anything" (11/10/2012)  . Stroke ([place]) ~ 2005   HX OF TIA-NO RESIDUAL PROBLEM--PARALYSIS RT SIDE FACE /PT'S MOUTH DROOPS-AND LOSS OF HEARING RT EAR --SINCE EAR SURGERY AS A CHILD   . UTI (lower urinary tract infection)    FREQUENT    Assessment: 87 year old with PMH significant for PAF on coumadin, recurrent UTI, HTN, HLD admitted to [facility] 04/22/2020 with acute closed left intratrochanteric hip fracture.Patient developed worsening dysphagia, MRI was obtain which showed acute stroke. Warfarin held per neurology recommendation. Pharmacy consulted to restart Warfarin on 1/31 - per neurology no bridging except aspirin until INR of 2.  Home warfarin regimen -Take 5 mg by mouth with supper on Sun/Mon/Wed/Fri/Sat and 7.5 mg on Tues/Thurs.  INR therapeutic at 2.5 today.   Goal of Therapy:  INR 2-3  Plan:  Continue home warfarin regimen - due for 5 mg today Continue daily INR, monitor for s/s bleeding  Rebbeca Paul, PharmD PGY1 Pharmacy Resident 05/04/2020 9:00 AM  Please check [email or web address] for unit-specific pharmacy phone numbers.

## 2020-05-04 NOTE — Progress Notes (Signed)
[place] PHYSICAL MEDICINE & REHABILITATION PROGRESS NOTE  Subjective/Complaints: Patient seen sitting up in bed this morning breakfast.  She states she slept fairly overnight due to generalized pain, which improved with medications.  She notes that she had a bowel movement as well.  ROS: Denies CP, SOB, N/V/D  Objective: Vital Signs: Blood pressure (!) 161/64, pulse 73, temperature 98.3 F (36.8 C), temperature source Oral, resp. rate 18, height 5\' 4"  (1.626 m), weight 86.8 kg, SpO2 96 %. No results found. No results for input(s): WBC, HGB, HCT, PLT in the last 72 hours. No results for input(s): NA, K, CL, CO2, GLUCOSE, BUN, CREATININE, CALCIUM in the last 72 hours.  Intake/Output Summary (Last 24 hours) at 05/04/2020 1342 Last data filed at 05/04/2020 0900 Gross per 24 hour  Intake 480 ml  Output --  Net 480 ml        Physical Exam: BP (!) 161/64 (BP Location: Right Arm)   Pulse 73   Temp 98.3 F (36.8 C) (Oral)   Resp 18   Ht 5\' 4"  (1.626 m)   Wt 86.8 kg   SpO2 96%   BMI 32.85 kg/m  Constitutional: No distress . Vital signs reviewed. HENT: Normocephalic.  Atraumatic. Eyes: EOMI. No discharge. Cardiovascular: No JVD.  RRR. Respiratory: Normal effort.  No stridor.  Bilateral clear to auscultation. GI: Non-distended.  BS +. Skin: Warm and dry.  Intact. Psych: Normal mood.  Normal behavior. Musc: Left hip with edema and tenderness Neuro: Alert Motor: B/l UE: 4/5 proximal distal Right lower extremity: Hip flexion, knee extension 2+-3 -/5, ankle dorsiflexion 4/5 (some pain inhibition), unchanged Left extremity: Hip flexion, knee extension 1/5, ankle dorsiflexion 3/5 (some pain inhibition), persistent, unchanged Very HOH Right facial weakness  Assessment/Plan: 1. Functional deficits which require 3+ hours per day of interdisciplinary therapy in a comprehensive inpatient rehab setting.  Physiatrist is providing close team supervision and 24 hour management of active  medical problems listed below.  Physiatrist and rehab team continue to assess barriers to discharge/monitor patient progress toward functional and medical goals   Care Tool:  Bathing    Body parts bathed by patient: Right arm,Left arm,Chest,Abdomen,Right upper leg,Left upper leg,Face,Front perineal area   Body parts bathed by helper: Buttocks,Right lower leg,Left lower leg     Bathing assist Assist Level: Moderate Assistance - Patient 50 - 74%     Upper Body Dressing/Undressing Upper body dressing   What is the patient wearing?: Pull over shirt    Upper body assist Assist Level: Set up assist    Lower Body Dressing/Undressing Lower body dressing      What is the patient wearing?: Pants,Incontinence brief     Lower body assist Assist for lower body dressing: Maximal Assistance - Patient 25 - 49%     Toileting Toileting    Toileting assist Assist for toileting: Maximal Assistance - Patient 25 - 49%     Transfers Chair/bed transfer  Transfers assist     Chair/bed transfer assist level: 2 Helpers     Locomotion Ambulation   Ambulation assist   Ambulation activity did not occur: Safety/medical concerns  Assist level: 2 helpers Assistive device: Parallel bars Max distance: 5   Walk 10 feet activity   Assist  Walk 10 feet activity did not occur: Safety/medical concerns        Walk 50 feet activity   Assist Walk 50 feet with 2 turns activity did not occur: Safety/medical concerns  Walk 150 feet activity   Assist Walk 150 feet activity did not occur: Safety/medical concerns         Walk 10 feet on uneven surface  activity   Assist Walk 10 feet on uneven surfaces activity did not occur: Safety/medical concerns         Wheelchair     Assist Will patient use wheelchair at discharge?: Yes (Per PT long term goals) Type of Wheelchair: Manual    Wheelchair assist level: Minimal Assistance - Patient > 75% Max wheelchair  distance: 19ft    Wheelchair 50 feet with 2 turns activity    Assist        Assist Level: Total Assistance - Patient < 25%   Wheelchair 150 feet activity     Assist      Assist Level: Total Assistance - Patient < 25%    Medical Problem List and Plan: 1.  Decreased functional ability secondary to left intertrochanteric femur fracture as well as history of left femur fracture May 2019 status post IM nailing as well as removal of hardware left femur from previous fracture 04/23/2020.  Weightbearing as tolerated.  Hospital course complicated by postoperative small acute infarct right cerebellum, bilateral occipital parietal lobes in the high left precentral gyrus.  Continue CIR 2.  Antithrombotics: -DVT/anticoagulation: Chronic Coumadin  INR therapeutic on 2/6             -antiplatelet therapy: On Coumadin 3. Pain Management: Oxycodone as needed, Robaxin as needed  See #17  Relatively controlled with meds on 2/6  Monitor with increased exertion 4. Mood: Provide emotional support             -antipsychotic agents: N/A 5. Neuropsych: This patient is ?fully capable of making decisions on her own behalf. 6.  Acute blood loss anemia.    Hemoglobin 8.4 on 2/2, labs ordered for tomorrow  Continue to monitor  7.  Atrial fibrillation.  Toprol-XL 25 mg daily.  Continue Coumadin.  Cardiac rate controlled  Controlled on 2/6  Monitor with increased activity 8. Hypertension: Chlorthalidone losartan currently on hold.  Continue Toprol  Losartan 25 started at bedtime due to elevated blood pressure requiring overnight  Labile, appears to be elevated overnight on 2/6, will consider further increase medication tomorrow if persistent  Monitor increase mobility 9.  CKD stage III.  Baseline creatinine ?1.46.    Creatinine 1.14 on 2/2, labs ordered for tomorrow  Continue to monitor 10.  Hyperlipidemia: Lipitor 11. Recurrent UTIs.  Latest urinalysis study negative.   12. Skin/Wound Care:  Routine skin checks 13. Fluids/Electrolytes/Nutrition: Routine in and outs 14. Very hard of hearing- talk to her LEFT ear.  15.  Chronic diarrhea: used Imodium 2 mg BID daily at home  16. Leg length discrepancy- L shorter than Right- husband to bring in appropriate lift shoes.  17. L shoulder bicipital tendinitis based on clinical exam  Voltaren gel 2G QID 18.  Congestion  Flonase ordered  Improved  LOS: 5 days A FACE TO FACE EVALUATION WAS PERFORMED  Meagen Limones Lorie Phenix 05/04/2020, 1:42 PM

## 2020-05-04 NOTE — Plan of Care (Signed)
  Problem: Consults Goal: RH STROKE PATIENT EDUCATION Description: See Patient Education module for education specifics  Outcome: Progressing   Problem: RH BOWEL ELIMINATION Goal: RH STG MANAGE BOWEL WITH ASSISTANCE Description: STG Manage Bowel with min Assistance. Outcome: Progressing   Problem: RH BLADDER ELIMINATION Goal: RH STG MANAGE BLADDER WITH ASSISTANCE Description: STG Manage Bladder With min Assistance Outcome: Progressing   Problem: RH SKIN INTEGRITY Goal: RH STG MAINTAIN SKIN INTEGRITY WITH ASSISTANCE Description: STG Maintain Skin Integrity With min Assistance. Outcome: Progressing   Problem: RH SAFETY Goal: RH STG ADHERE TO SAFETY PRECAUTIONS W/ASSISTANCE/DEVICE Description: STG Adhere to Safety Precautions With Assistance/Device. Outcome: Progressing   Problem: RH PAIN MANAGEMENT Goal: RH STG PAIN MANAGED AT OR BELOW PT'S PAIN GOAL Outcome: Progressing   Problem: RH KNOWLEDGE DEFICIT Goal: RH STG INCREASE KNOWLEDGE OF HYPERTENSION Outcome: Progressing Goal: RH STG INCREASE KNOWLEGDE OF HYPERLIPIDEMIA Outcome: Progressing Goal: RH STG INCREASE KNOWLEDGE OF STROKE PROPHYLAXIS Outcome: Progressing   

## 2020-05-05 DIAGNOSIS — S72142S Displaced intertrochanteric fracture of left femur, sequela: Secondary | ICD-10-CM

## 2020-05-05 DIAGNOSIS — I482 Chronic atrial fibrillation, unspecified: Secondary | ICD-10-CM

## 2020-05-05 DIAGNOSIS — N1831 Chronic kidney disease, stage 3a: Secondary | ICD-10-CM

## 2020-05-05 LAB — CBC
HCT: 24.4 % — ABNORMAL LOW (ref 36.0–46.0)
Hemoglobin: 7.8 g/dL — ABNORMAL LOW (ref 12.0–15.0)
MCH: 29.3 pg (ref 26.0–34.0)
MCHC: 32 g/dL (ref 30.0–36.0)
MCV: 91.7 fL (ref 80.0–100.0)
Platelets: 343 10*3/uL (ref 150–400)
RBC: 2.66 MIL/uL — ABNORMAL LOW (ref 3.87–5.11)
RDW: 15 % (ref 11.5–15.5)
WBC: 6.3 10*3/uL (ref 4.0–10.5)
nRBC: 0 % (ref 0.0–0.2)

## 2020-05-05 LAB — PROTIME-INR
INR: 2.5 — ABNORMAL HIGH (ref 0.8–1.2)
Prothrombin Time: 26.1 seconds — ABNORMAL HIGH (ref 11.4–15.2)

## 2020-05-05 NOTE — Progress Notes (Signed)
ANTICOAGULATION CONSULT NOTE - Follow Up Consult  Pharmacy Consult for Warfarin Indication: atrial fibrillation  Allergies  Allergen Reactions  . Pradaxa [Dabigatran Etexilate Mesylate] Other (See Comments)    INTERNAL BLEEDING  . Clindamycin/Lincomycin Other (See Comments)    PT STATES HER DOCTOR TOLD HER NOT TO TAKE CLINDAMYCIN BECAUSE SHE GOT C-DIFF AFTER TAKING AMPICILLIN- "tolerated in 2022" (per spouse)  . Latex Other (See Comments)    Blisters   . Ampicillin Other (See Comments)    C-DIF AFTER TAKING AMPICILLIN- "tolerated in 2022" (per husband) Has patient had a PCN reaction causing immediate rash, facial/tongue/throat swelling, SOB or lightheadedness with hypotension: No Has patient had a PCN reaction causing severe rash involving mucus membranes or skin necrosis: No Has patient had a PCN reaction that required hospitalization: No Has patient had a PCN reaction occurring within the last 10 years: No If all of the above answers are "NO", then may proceed with Cephalosporin u  . Sulfamethoxazole Diarrhea    Patient Measurements: Height: 5\' 4"  (162.6 cm) Weight: 87 kg (191 lb 12.8 oz) IBW/kg (Calculated) : 54.7  Vital Signs: Temp: 98 F (36.7 C) (02/07 0347) Temp Source: Oral (02/07 0347) BP: 172/62 (02/07 0347) Pulse Rate: 64 (02/07 0347)  Labs: Recent Labs    05/03/20 0507 05/04/20 0639 05/05/20 0542  HGB  --   --  7.8*  HCT  --   --  24.4*  PLT  --   --  343  LABPROT 25.0* 26.1* 26.1*  INR 2.4* 2.5* 2.5*    Estimated Creatinine Clearance: 39.2 mL/min (A) (by C-G formula based on SCr of 1.14 mg/dL (H)).   Medical History: Past Medical History:  Diagnosis Date  . Arthritis    "fingers, hips, feet, ankles" (11/10/2012)  . Asthma   . Atrial fibrillation (HCC)    CHRONIC COUMADIN  . Breast cancer ([place]) 1990's   "cancer on one side; precancerous tissue on the other" (11/10/2012)  . Chronic bronchitis ([place])    "multiple times; not in a long time"  (11/10/2012  . Depression   . GERD (gastroesophageal reflux disease)   . Hearing impaired   . Hypertension   . Irritable bowel   . Lymphedema    HX OF - IN LEFT ARM--NO NEEDLES OR B/P'S LEFT ARM  . Macular degeneration    "BEGINNINGS OF" MACULAR DEGENERATION  . Osteoporosis   . Pneumonia    "multiple times; not in a long time" (11/10/2012)  . Recurrent UTI   . Sleep apnea    "dx'd w/it; don't wear mask or anything" (11/10/2012)  . Stroke ([place]) ~ 2005   HX OF TIA-NO RESIDUAL PROBLEM--PARALYSIS RT SIDE FACE /PT'S MOUTH DROOPS-AND LOSS OF HEARING RT EAR --SINCE EAR SURGERY AS A CHILD   . UTI (lower urinary tract infection)    FREQUENT    Assessment: 87 year old with PMH significant for PAF on coumadin, recurrent UTI, HTN, HLD admitted to [facility] 04/22/2020 with acute closed left intratrochanteric hip fracture.Patient developed worsening dysphagia, MRI was obtain which showed acute stroke. Warfarin held per neurology recommendation. Pharmacy consulted to restart Warfarin on 1/31.  Home warfarin regimen -Take 5 mg by mouth with supper on Sun/Mon/Wed/Fri/Sat and 7.5 mg on Tues/Thurs.  INR therapeutic at 2.5 today.   Goal of Therapy:  INR 2-3  Plan:  Continue home warfarin regimen Continue daily INR  Hildred Laser, PharmD Clinical Pharmacist **Pharmacist phone directory can now be found on [email or web address] ([email or web address]).  Listed under [facility]  Pharmacy.

## 2020-05-05 NOTE — Progress Notes (Signed)
Occupational Therapy Session Note  Patient Details  Name: [name] MRN: [id number] Date of Birth: Nov 23, 1933  Today's Date: 05/05/2020 OT Individual Time: 0865-7846 OT Individual Time Calculation (min): 65 min    Short Term Goals: Week 1:  OT Short Term Goal 1 (Week 1): Pt will be able to rise to stand with RW with max A of 1 to prepare for toileting. OT Short Term Goal 2 (Week 1): Pt will be able to stand with RW and release 1 hand for clothing management with max A. OT Short Term Goal 3 (Week 1): Pt will be able to stand pivot with RW with max A of 1. OT Short Term Goal 4 (Week 1): Pt will demonstrate improved processing by donning shirt with min cues.  Skilled Therapeutic Interventions/Progress Updates:    Pt received in wc with spouse in the room. He asked, "is she going to walk today?"  Explained we will attempt based on her pain tolerance and strength.  Pt taken to gym and worked on warm up exercises of B knee AROM, ball squeezes for hip adduction and resisted calf exercises pressing down on therapy disc.  Then moved to sit to stands. By pulling up on the bars, pt able to rise to stand with mod A.  She is now standing with much improved posture, with only moderate curvature of upper back. In standing focused on LLE weight bearing tolerance with standing and wt shifting laterally and ant/posteriorly.  Pt able to do some partial calf raises and take small steps forward and back with 1 step of L leg.  With R leg very difficult as she did not want to put too much wt on her left, but she was able to advance it an inch or so and back.  For further ROM of R foot to advance forward and back, tried having pt slide it forward and back on a powder board but she was not able to move it much further.  Did attempt 2x, taking full steps forward.  In bars, she was able to take 4 steps forward and back 2x then rested but then could only do 2 steps as her L leg just was not able to support enough body  weight.  Pt participated well but was fatigued. Pt returned to room with spouse in the room.    Therapy Documentation Precautions:  Precautions Precautions: Fall Restrictions Weight Bearing Restrictions: Yes LUE Weight Bearing: Weight bearing as tolerated LLE Weight Bearing: Weight bearing as tolerated Pain: Pain Assessment Pain Scale: 0-10 Pain Score: 8  Pain Type: Acute pain Pain Location: Leg Pain Orientation: Left Pain Descriptors / Indicators: Aching Pain Onset: With Activity Patients Stated Pain Goal: 1 Pain Intervention(s): Medication (See eMAR) Multiple Pain Sites: No   Therapy/Group: Individual Therapy  [place] 05/05/2020, 10:30 AM

## 2020-05-05 NOTE — Plan of Care (Signed)
  Problem: Consults Goal: RH STROKE PATIENT EDUCATION Description: See Patient Education module for education specifics  Outcome: Progressing   Problem: RH BOWEL ELIMINATION Goal: RH STG MANAGE BOWEL WITH ASSISTANCE Description: STG Manage Bowel with min Assistance. Outcome: Progressing   Problem: RH BLADDER ELIMINATION Goal: RH STG MANAGE BLADDER WITH ASSISTANCE Description: STG Manage Bladder With min Assistance Outcome: Progressing   Problem: RH SKIN INTEGRITY Goal: RH STG MAINTAIN SKIN INTEGRITY WITH ASSISTANCE Description: STG Maintain Skin Integrity With min Assistance. Outcome: Progressing   Problem: RH SAFETY Goal: RH STG ADHERE TO SAFETY PRECAUTIONS W/ASSISTANCE/DEVICE Description: STG Adhere to Safety Precautions With Assistance/Device. Outcome: Progressing   Problem: RH PAIN MANAGEMENT Goal: RH STG PAIN MANAGED AT OR BELOW PT'S PAIN GOAL Outcome: Progressing   Problem: RH KNOWLEDGE DEFICIT Goal: RH STG INCREASE KNOWLEDGE OF HYPERTENSION Outcome: Progressing Goal: RH STG INCREASE KNOWLEGDE OF HYPERLIPIDEMIA Outcome: Progressing Goal: RH STG INCREASE KNOWLEDGE OF STROKE PROPHYLAXIS Outcome: Progressing   

## 2020-05-05 NOTE — Progress Notes (Signed)
[place] PHYSICAL MEDICINE & REHABILITATION PROGRESS NOTE  Subjective/Complaints: Didn't sleep all that well last night. Was on her left side, and couldn't adjust to move off of it. Had trouble communicating with nurse what she needed. When she was finally adjusted in bed, she was fine  ROS: Patient denies fever, rash, sore throat, blurred vision, nausea, vomiting, diarrhea, cough, shortness of breath or chest pain, headache, or mood change.    Objective: Vital Signs: Blood pressure (!) 172/62, pulse 64, temperature 98 F (36.7 C), temperature source Oral, resp. rate 16, height 5\' 4"  (1.626 m), weight 87 kg, SpO2 99 %. No results found. Recent Labs    05/05/20 0542  WBC 6.3  HGB 7.8*  HCT 24.4*  PLT 343   No results for input(s): NA, K, CL, CO2, GLUCOSE, BUN, CREATININE, CALCIUM in the last 72 hours.  Intake/Output Summary (Last 24 hours) at 05/05/2020 1125 Last data filed at 05/04/2020 1843 Gross per 24 hour  Intake 480 ml  Output --  Net 480 ml        Physical Exam: BP (!) 172/62 (BP Location: Right Arm)   Pulse 64   Temp 98 F (36.7 C) (Oral)   Resp 16   Ht 5\' 4"  (1.626 m)   Wt 87 kg   SpO2 99%   BMI 32.92 kg/m  Constitutional: No distress . Vital signs reviewed. HEENT: EOMI, oral membranes moist Neck: supple Cardiovascular: RRR without murmur. No JVD    Respiratory/Chest: CTA Bilaterally without wheezes or rales. Normal effort    GI/Abdomen: BS +, non-tender, non-distended Ext: no clubbing, cyanosis, edema Psych: pleasant and cooperative Skin: Left hip wounds CDI Musc: Left hip with edema and tenderness ongoing Neuro: Alert Motor: B/l UE: 4/5 proximal distal Right lower extremity: Hip flexion, knee extension 2+-3 -/5, ankle dorsiflexion 4/5 (some pain inhibition), unchanged Left extremity: Hip flexion, knee extension 1+/5, ankle dorsiflexion 3 to 3+/5 with pain inhibition Remains very HOH Right facial weakness  Assessment/Plan: 1. Functional deficits  which require 3+ hours per day of interdisciplinary therapy in a comprehensive inpatient rehab setting.  Physiatrist is providing close team supervision and 24 hour management of active medical problems listed below.  Physiatrist and rehab team continue to assess barriers to discharge/monitor patient progress toward functional and medical goals   Care Tool:  Bathing    Body parts bathed by patient: Right arm,Left arm,Chest,Abdomen,Right upper leg,Left upper leg,Face,Front perineal area   Body parts bathed by helper: Buttocks,Right lower leg,Left lower leg     Bathing assist Assist Level: Moderate Assistance - Patient 50 - 74%     Upper Body Dressing/Undressing Upper body dressing   What is the patient wearing?: Pull over shirt    Upper body assist Assist Level: Set up assist    Lower Body Dressing/Undressing Lower body dressing      What is the patient wearing?: Pants,Incontinence brief     Lower body assist Assist for lower body dressing: Maximal Assistance - Patient 25 - 49%     Toileting Toileting    Toileting assist Assist for toileting: Maximal Assistance - Patient 25 - 49%     Transfers Chair/bed transfer  Transfers assist     Chair/bed transfer assist level: 2 Helpers     Locomotion Ambulation   Ambulation assist   Ambulation activity did not occur: Safety/medical concerns  Assist level: 2 helpers Assistive device: Parallel bars Max distance: 5   Walk 10 feet activity   Assist  Walk 10 feet  activity did not occur: Safety/medical concerns        Walk 50 feet activity   Assist Walk 50 feet with 2 turns activity did not occur: Safety/medical concerns         Walk 150 feet activity   Assist Walk 150 feet activity did not occur: Safety/medical concerns         Walk 10 feet on uneven surface  activity   Assist Walk 10 feet on uneven surfaces activity did not occur: Safety/medical concerns          Wheelchair     Assist Will patient use wheelchair at discharge?: Yes (Per PT long term goals) Type of Wheelchair: Manual    Wheelchair assist level: Minimal Assistance - Patient > 75% Max wheelchair distance: 18ft    Wheelchair 50 feet with 2 turns activity    Assist        Assist Level: Total Assistance - Patient < 25%   Wheelchair 150 feet activity     Assist      Assist Level: Total Assistance - Patient < 25%    Medical Problem List and Plan: 1.  Decreased functional ability secondary to left intertrochanteric femur fracture as well as history of left femur fracture May 2019 status post IM nailing as well as removal of hardware left femur from previous fracture 04/23/2020.  Weightbearing as tolerated.  Hospital course complicated by postoperative small acute infarct right cerebellum, bilateral occipital parietal lobes in the high left precentral gyrus.  Continue CIR 2.  Antithrombotics: -DVT/anticoagulation: Chronic Coumadin  INR therapeutic on 2/7 2.5             -antiplatelet therapy: On Coumadin 3. Pain Management: Oxycodone as needed, Robaxin as needed  See #17  Relatively controlled with meds on 2/7  Discussed positioning in bed. Needs to utilize ice also 4. Mood: Provide emotional support             -antipsychotic agents: N/A 5. Neuropsych: This patient is ?fully capable of making decisions on her own behalf. 6.  Acute blood loss anemia.    Hemoglobin 8.4 on 2/2--> 7.8 2/7. No signs of gross blood loss  -recheck H/H 2/8     7.  Atrial fibrillation.  Toprol-XL 25 mg daily.  Continue Coumadin.  Cardiac rate controlled  HR Controlled on 2/7  Monitor with increased activity 8. Hypertension: Chlorthalidone losartan currently on hold.  Continue Toprol  Losartan 25 started at bedtime due to elevated blood pressure requiring overnight  2/7 some intermittent systolic elevation. Not enough to change regimen at this point. Continue to monitor    9.  CKD  stage III.  Baseline creatinine ?1.46.    Creatinine 1.14 on 2/2, labs pending for 2/7  Continue to monitor 10.  Hyperlipidemia: Lipitor 11. Recurrent UTIs.  Latest urinalysis study negative.   12. Skin/Wound Care: Routine skin checks 13. Fluids/Electrolytes/Nutrition: Routine in and outs 14. Very hard of hearing- talk to her LEFT ear.  15.  Chronic diarrhea: used Imodium 2 mg BID daily at home  16. Leg length discrepancy- L shorter than Right- husband to bring in appropriate lift shoes.  17. L shoulder bicipital tendinitis based on clinical exam  Voltaren gel 2G QID 18.  Congestion  Flonase ordered  Improved  LOS: 6 days A FACE TO FACE EVALUATION WAS PERFORMED  Meredith Staggers 05/05/2020, 11:25 AM

## 2020-05-05 NOTE — Progress Notes (Addendum)
Physical Therapy Session Note  Patient Details  Name: [name] MRN: [id number] Date of Birth: 20-May-1933  Today's Date: 05/05/2020 PT Individual Time: 0800-0859 +  1100-1145 + 1330-1400 PT Individual Time Calculation (min): 59 min  + 45 min + 30 min  Short Term Goals: Week 1:  PT Short Term Goal 1 (Week 1): Pt will complete bed mobility with maxA PT Short Term Goal 2 (Week 1): Pt will complete bed<>chair transfers with maxA and LRAD PT Short Term Goal 3 (Week 1): Pt will initiate gait training with LRAD  Skilled Therapeutic Interventions/Progress Updates:     1st session: Pt received semi-reclined in bed, awake and agreeable to therapy. Reports 2/10 L leg pain at rest. RN was notified as pt hasn't received any pain medications. Pt c/o inability to reach her food as the tray was too far forward. Educated her on bed features to assist with bringing her HOB up, closer to tray. Donned knee-high TED's and non-slip socks with totalA for time management. She required mod/maxA for supine<>sit with HOB nearly fully elevated, requiring extra time due to L leg pain. Able to sit EOB with SBA but noted offloading of L hip. Sit<>stand with minA +2 from raised EOB height to [place], relying quite heavily on BUE's to pull herself up. Stedy transfer in perched position to the bathroom as pt reporting need to void and have a BM. She was able to stand from perched position in [place] with CGA but required totalA for brief management as she stood. Required CGA for controlled lowering to the 3-1 [facility] that was over the toilet. Pt continent of bladder, charted. Able to stand from 3-1 [facility] with modA with the [facility], again relying heavily on BUE's to pull herself up. Stedy transfer to the sink as pt was then requested to brush teeth. She required setupA for doing this but was able to brush teeth while seated in perched position with good sitting balance noted. Assisted with upper body and lower body dressing while in the [facility].  She required maxA for upper body and totalA for lower body. Stedy transfer to her w/c and RN arriving thereafter for morning medications and pain medications. Pt participate in seated there-ex while this occurred, including: -1x10 arm raises to 90 deg (limited shoulder flexion, likely limited 2/2 scar tissue from B mastectomy) -1x10 LAQ (AAROM for RLE) -1x10 hip marches (AAROM for RLE) -1x10 glut sets  Pt ended session seated in w/c with needs, husband at bedside, pt made comfortable.   2nd session: Pt received sitting upright in w/c, husband at bedside, pt hesitant but agreeable to therapy. Hesitancy 2/2 "just finishing" her busy OT session. W/c transport to ortho gym for time management. Performed Stand<>pivot transfer with modA and no AD from w/c to [place]. Required extra time and assist for placing her feet into the stirrups on the N[place]. She was positioned appropriately and was using both BUE's and BLE's with workload of 3, focusing on self AAROM for LLE and weight bearing acceptance through LLE. Pt unable to achieve full ROM, however she was putting some weight (~15%) through LLE. After ~2 minutes of doing this, she had a significant bout of pain, screaming and moaning, difficulty to calm down. Likely due to increased ROM with hip flex/knee flexion. Required totalA for reposition her L leg off of the stirrup and redirecting pain. Stand<>pivot with mod/maxA from Nust[place] back to her w/c, limited weight bearing for LLE for transfer. Provided patient with options for remainder of session  and she chose standing horseshoes. Performed x2 sit<>stands with maxA from w/c but pt unable to achieve full upright due to L leg pain. Ultimately, deferring further functional mobility training. Pt was returned to her room and her RN was notified of patient's pain for medications. She ended session seated in w/c with needs in reach and husband at bedside. She missed 15 minutes of skilled therapy this session 2/2  pain.  3rd session: Handoff of care from NT who was assisting with toileting. Pt in [place] on arrival and wheeled sinkside in stedy on perched position. Sit<>stand with CGA within stedy and able to wash her hands with CGA but difficulty with forward reaching to get to the soap. Stedy transfer to her w/c and then transported to main rehab gym for time management. Performed life-size checkers game while standing with CGA for balance but requiring heavy BUE support onto the high/low table. She was able to tolerate standing for ~8-9 minutes (!!) while doing this. Standing postural deficits include forward flexed trunk, limited weight shift to LLE. Pt requesting to attempt gait trial. Wheeled inside // bars and she was able to stand with min/modA with BUE pulling from // bar. She was able to ambulate the length of the // bars, ~7ft, with minA!! She was able to manage her LLE indep but required minA for steadying while shifting weight to LLE in order to advance her RLE. Pt pleased with progress. She was returned to her room with [place] in her w/c and she remained seated in w/c with husband at bedside, needs wtihin reach.   Therapy Documentation Precautions:  Precautions Precautions: Fall Restrictions Weight Bearing Restrictions: Yes LUE Weight Bearing: Weight bearing as tolerated LLE Weight Bearing: Weight bearing as tolerated General: PT Amount of Missed Time (min): 15 Minutes PT Missed Treatment Reason: Pain (L  leg)  Therapy/Group: Individual Therapy  Gina Leblond P Adisson Deak PT 05/05/2020, 7:29 AM

## 2020-05-06 DIAGNOSIS — G8918 Other acute postprocedural pain: Secondary | ICD-10-CM

## 2020-05-06 DIAGNOSIS — I480 Paroxysmal atrial fibrillation: Secondary | ICD-10-CM

## 2020-05-06 DIAGNOSIS — S72142D Displaced intertrochanteric fracture of left femur, subsequent encounter for closed fracture with routine healing: Principal | ICD-10-CM

## 2020-05-06 LAB — PROTIME-INR
INR: 2.7 — ABNORMAL HIGH (ref 0.8–1.2)
Prothrombin Time: 27.8 seconds — ABNORMAL HIGH (ref 11.4–15.2)

## 2020-05-06 NOTE — Progress Notes (Signed)
[place] PHYSICAL MEDICINE & REHABILITATION PROGRESS NOTE  Subjective/Complaints: Patient seen sitting up in bed this AM.  She states she slept fairly overnight due to positioning.  She wants a pillow under her left knee, educated on risk of contractures.  She does not recall if she had a BM.   ROS: Denies CP, SOB, N/V/D  Objective: Vital Signs: Blood pressure (!) 160/62, pulse 66, temperature 98.3 F (36.8 C), temperature source Oral, resp. rate 19, height 5\' 4"  (1.626 m), weight 88.2 kg, SpO2 96 %. No results found. Recent Labs    05/05/20 0542  WBC 6.3  HGB 7.8*  HCT 24.4*  PLT 343   No results for input(s): NA, K, CL, CO2, GLUCOSE, BUN, CREATININE, CALCIUM in the last 72 hours. No intake or output data in the 24 hours ending 05/06/20 1106      Physical Exam: BP (!) 160/62 (BP Location: Right Arm)   Pulse 66   Temp 98.3 F (36.8 C) (Oral)   Resp 19   Ht 5\' 4"  (1.626 m)   Wt 88.2 kg   SpO2 96%   BMI 33.38 kg/m   Constitutional: No distress . Vital signs reviewed. HENT: Normocephalic.  Atraumatic. Eyes: EOMI. No discharge. Cardiovascular: No JVD.  RRR. Respiratory: Normal effort.  No stridor.  Bilateral clear to auscultation. GI: Non-distended.  BS +. Skin: Warm and dry.  Intact. Psych: Normal mood.  Normal behavior. Musc: Left hip with edema and tenderness Neuro: Alert Motor: B/l UE: 4/5 proximal distal Right lower extremity: Hip flexion, knee extension 2+-3 -/5, ankle dorsiflexion 4/5 (some pain inhibition), unchanged Left extremity: Hip flexion, knee extension 1+/5, ankle dorsiflexion 3/5 with pain inhibition Very HOH Right facial weakness  Assessment/Plan: 1. Functional deficits which require 3+ hours per day of interdisciplinary therapy in a comprehensive inpatient rehab setting.  Physiatrist is providing close team supervision and 24 hour management of active medical problems listed below.  Physiatrist and rehab team continue to assess barriers to  discharge/monitor patient progress toward functional and medical goals   Care Tool:  Bathing    Body parts bathed by patient: Right arm,Left arm,Chest,Abdomen,Right upper leg,Left upper leg,Face,Front perineal area   Body parts bathed by helper: Buttocks,Right lower leg,Left lower leg     Bathing assist Assist Level: Moderate Assistance - Patient 50 - 74%     Upper Body Dressing/Undressing Upper body dressing   What is the patient wearing?: Pull over shirt    Upper body assist Assist Level: Set up assist    Lower Body Dressing/Undressing Lower body dressing      What is the patient wearing?: Pants,Incontinence brief     Lower body assist Assist for lower body dressing: Maximal Assistance - Patient 25 - 49%     Toileting Toileting    Toileting assist Assist for toileting: Maximal Assistance - Patient 25 - 49%     Transfers Chair/bed transfer  Transfers assist     Chair/bed transfer assist level: Maximal Assistance - Patient 25 - 49%     Locomotion Ambulation   Ambulation assist   Ambulation activity did not occur: Safety/medical concerns  Assist level: Minimal Assistance - Patient > 75% Assistive device: Parallel bars Max distance: 10   Walk 10 feet activity   Assist  Walk 10 feet activity did not occur: Safety/medical concerns  Assist level: Minimal Assistance - Patient > 75% Assistive device: Parallel bars   Walk 50 feet activity   Assist Walk 50 feet with 2 turns activity  did not occur: Safety/medical concerns         Walk 150 feet activity   Assist Walk 150 feet activity did not occur: Safety/medical concerns         Walk 10 feet on uneven surface  activity   Assist Walk 10 feet on uneven surfaces activity did not occur: Safety/medical concerns         Wheelchair     Assist Will patient use wheelchair at discharge?: Yes (Per PT long term goals) Type of Wheelchair: Manual    Wheelchair assist level: Minimal  Assistance - Patient > 75% Max wheelchair distance: 70ft    Wheelchair 50 feet with 2 turns activity    Assist        Assist Level: Total Assistance - Patient < 25%   Wheelchair 150 feet activity     Assist      Assist Level: Total Assistance - Patient < 25%    Medical Problem List and Plan: 1.  Decreased functional ability secondary to left intertrochanteric femur fracture as well as history of left femur fracture May 2019 status post IM nailing as well as removal of hardware left femur from previous fracture 04/23/2020.  Weightbearing as tolerated.  Hospital course complicated by postoperative small acute infarct right cerebellum, bilateral occipital parietal lobes in the high left precentral gyrus.  Continue CIR 2.  Antithrombotics: -DVT/anticoagulation: Chronic Coumadin  INR therapeutic on 2/8             -antiplatelet therapy: On Coumadin 3. Pain Management: Oxycodone as needed, Robaxin as needed  See #17  Relatively controlled with meds on 2/8  Discussed positioning in bed. Needs to utilize ice also 4. Mood: Provide emotional support             -antipsychotic agents: N/A 5. Neuropsych: This patient is ?fully capable of making decisions on her own behalf. 6.  Acute blood loss anemia.    Hemoglobin 7.8 on 2/7   7.  Atrial fibrillation.  Toprol-XL 25 mg daily.  Continue Coumadin.  Cardiac rate controlled  HR Controlled on 2/8, labs ordered for tomorrow  Monitor with increased activity 8. Hypertension: Chlorthalidone losartan currently on hold.  Continue Toprol  Losartan 25 started at bedtime due to elevated blood pressure requiring overnight   Remains elevated in the evening, will consider medication adjustments if persistent 9.  CKD stage III.  Baseline creatinine ?1.46.    Creatinine 1.14 on 2/2, labs ordered for tomorrow  Continue to monitor 10.  Hyperlipidemia: Lipitor 11. Recurrent UTIs.  Latest urinalysis study negative.   12. Skin/Wound Care: Routine  skin checks 13. Fluids/Electrolytes/Nutrition: Routine in and outs 14. Very hard of hearing- talk to her LEFT ear.  15.  Chronic diarrhea: used Imodium 2 mg BID daily at home  16. Leg length discrepancy- L shorter than Right- husband to bring in appropriate lift shoes.  17. L shoulder bicipital tendinitis based on clinical exam  Voltaren gel 2G QID 18.  Congestion  Flonase ordered  Improved  LOS: 7 days A FACE TO FACE EVALUATION WAS PERFORMED  Ankit Lorie Phenix 05/06/2020, 11:06 AM

## 2020-05-06 NOTE — Progress Notes (Signed)
Occupational Therapy Session Note  Patient Details  Name: [name] MRN: [id number] Date of Birth: 1933-09-25  Today's Date: 05/06/2020 OT Individual Time: 1300-1400 OT Individual Time Calculation (min): 60 min    Short Term Goals: Week 1:  OT Short Term Goal 1 (Week 1): Pt will be able to rise to stand with RW with max A of 1 to prepare for toileting. OT Short Term Goal 2 (Week 1): Pt will be able to stand with RW and release 1 hand for clothing management with max A. OT Short Term Goal 3 (Week 1): Pt will be able to stand pivot with RW with max A of 1. OT Short Term Goal 4 (Week 1): Pt will demonstrate improved processing by donning shirt with min cues.  Skilled Therapeutic Interventions/Progress Updates:    Pt sitting up in w/c, reports 4/10 pain at rest, does not wish to shower today but states she is open to completing tomorrow.  Pt transported to rehab gym and completed sit<>stand with CGA and ambulation with min assist of approximately 14 steps within parallel bars.  Pt required increased time to weight shift onto LLE due to pain.  Pt transported back to room and completed blocked practice stand pivot using RW requiring min assist +2 to complete w/c<>EOB and step by step VCs for sequencing of pivot, safe hand placement during sit<>stand, and RW mgt.  Pt reporting increased pain at end of session increased to 7/10 pain LLE and requesting pain medication as prescribed; nurse notified. Call bell in reach, LLE elevated, seat belt alarm on.  Therapy Documentation Precautions:  Precautions Precautions: Fall Restrictions Weight Bearing Restrictions: Yes LUE Weight Bearing: Weight bearing as tolerated LLE Weight Bearing: Weight bearing as tolerated   Therapy/Group: Individual Therapy  Ezekiel Slocumb 05/06/2020, 4:13 PM

## 2020-05-06 NOTE — Progress Notes (Signed)
Physical Therapy Session Note  Patient Details  Name: [name] MRN: [id number] Date of Birth: [birth year]  Today's Date: 05/06/2020 PT Individual Time: 1791-5056  PT Individual Time Calculation (min): 70 min    Short Term Goals: Week 1:  PT Short Term Goal 1 (Week 1): Pt will complete bed mobility with maxA PT Short Term Goal 2 (Week 1): Pt will complete bed<>chair transfers with maxA and LRAD PT Short Term Goal 3 (Week 1): Pt will initiate gait training with LRAD  Skilled Therapeutic Interventions/Progress Updates:    Pt received sitting upright in w/c, agreeable to therapy but requesting pain medications prior as she reports 6/10 L leg pain. RN arriving shortly to provide this for her. W/c transport to the main therapy gym inside // bars for time management and energy conservation. Able to perform sit<>stand with minA in // bars as long as she is pulling up with BUE's to stand. Able to stand with minA with BUE support to bars and she was able to ambulate 73ft + 46ft (seated rest) with modA and +2 assist for chair follow for safety. Demo's forward flexed trunk, antalgic gait, very limited weight shift to LLE while advancing her RLE, hip hiking strategies. Performed dynamic standing balance within // bar while performing ball toss in standing with therapist providing minA guard and rehab tech tossing ball. She started with using RUE to tap ball back and then used LUE after rest. Focused on functional reaching, reaction times, and standing tolerance . While still in // bars, performed standing toe taps 1x5 on 1inch platform with LLE, focusing on L knee/hip flexion and AROM for L leg. She also performed lateral weight shifts L<>R within // bars to promote LLE weight bearing. She was returned to her room with [place] for time management. She ended session seated in w/c with needs in reach, husband at bedside.    Therapy Documentation Precautions:  Precautions Precautions: Fall Restrictions Weight  Bearing Restrictions: Yes LUE Weight Bearing: Weight bearing as tolerated LLE Weight Bearing: Weight bearing as tolerated  Therapy/Group: Individual Therapy  Casimiro Lienhard P Omari Koslosky PT 05/06/2020, 7:43 AM

## 2020-05-06 NOTE — Progress Notes (Signed)
[place] for Warfarin Indication: atrial fibrillation  Allergies  Allergen Reactions  . Pradaxa [Dabigatran Etexilate Mesylate] Other (See Comments)    INTERNAL BLEEDING  . Clindamycin/Lincomycin Other (See Comments)    PT STATES HER DOCTOR TOLD HER NOT TO TAKE CLINDAMYCIN BECAUSE SHE GOT C-DIFF AFTER TAKING AMPICILLIN- "tolerated in 2022" (per spouse)  . Latex Other (See Comments)    Blisters   . Ampicillin Other (See Comments)    C-DIF AFTER TAKING AMPICILLIN- "tolerated in 2022" (per husband) Has patient had a PCN reaction causing immediate rash, facial/tongue/throat swelling, SOB or lightheadedness with hypotension: No Has patient had a PCN reaction causing severe rash involving mucus membranes or skin necrosis: No Has patient had a PCN reaction that required hospitalization: No Has patient had a PCN reaction occurring within the last 10 years: No If all of the above answers are "NO", then may proceed with Cephalosporin u  . Sulfamethoxazole Diarrhea    Patient Measurements: Height: 5\' 4"  (162.6 cm) Weight: 88.2 kg (194 lb 7.1 oz) IBW/kg (Calculated) : 54.7  Vital Signs: Temp: 98.3 F (36.8 C) (02/08 0437) Temp Source: Oral (02/08 0437) BP: 160/62 (02/08 0437) Pulse Rate: 66 (02/08 0437)  Labs: Recent Labs    05/04/20 0639 05/05/20 0542 05/06/20 0508  HGB  --  7.8*  --   HCT  --  24.4*  --   PLT  --  343  --   LABPROT 26.1* 26.1* 27.8*  INR 2.5* 2.5* 2.7*    Estimated Creatinine Clearance: 39.5 mL/min (A) (by C-G formula based on SCr of 1.14 mg/dL (H)).   Assessment: 87 year old with PMH significant for PAF on warfarin, recurrent UTI, HTN, HLD admitted to [facility] 04/22/2020 with acute closed left intratrochanteric hip fracture.Patient developed worsening dysphagia, MRI was obtain which showed acute stroke. Warfarin held per neurology recommendation. Pharmacy consulted to restart Warfarin on 1/31.  INR therapeutic at  2.7 today.  No bleeding reported  Goal of Therapy:  INR 2-3  Plan:  Continue home dose Warfarin 5mg  PO daily except 7.5mg  on Tues/Thurs Reduce INR to MWF  Avari Gelles D. Mina Marble, PharmD, BCPS, [place] 05/06/2020, 11:02 AM

## 2020-05-07 DIAGNOSIS — K219 Gastro-esophageal reflux disease without esophagitis: Secondary | ICD-10-CM

## 2020-05-07 LAB — BASIC METABOLIC PANEL
Anion gap: 10 (ref 5–15)
BUN: 23 mg/dL (ref 8–23)
CO2: 23 mmol/L (ref 22–32)
Calcium: 8.4 mg/dL — ABNORMAL LOW (ref 8.9–10.3)
Chloride: 107 mmol/L (ref 98–111)
Creatinine, Ser: 1.14 mg/dL — ABNORMAL HIGH (ref 0.44–1.00)
GFR, Estimated: 47 mL/min — ABNORMAL LOW (ref >60)
Glucose, Bld: 104 mg/dL — ABNORMAL HIGH (ref 70–99)
Potassium: 3.9 mmol/L (ref 3.5–5.1)
Sodium: 140 mmol/L (ref 135–145)

## 2020-05-07 LAB — CBC WITH DIFFERENTIAL/PLATELET
Abs Immature Granulocytes: 0.03 10*3/uL (ref 0.00–0.07)
Basophils Absolute: 0 10*3/uL (ref 0.0–0.1)
Basophils Relative: 0 %
Eosinophils Absolute: 0.2 10*3/uL (ref 0.0–0.5)
Eosinophils Relative: 2 %
HCT: 25.9 % — ABNORMAL LOW (ref 36.0–46.0)
Hemoglobin: 8.1 g/dL — ABNORMAL LOW (ref 12.0–15.0)
Immature Granulocytes: 0 %
Lymphocytes Relative: 13 %
Lymphs Abs: 1 10*3/uL (ref 0.7–4.0)
MCH: 28.6 pg (ref 26.0–34.0)
MCHC: 31.3 g/dL (ref 30.0–36.0)
MCV: 91.5 fL (ref 80.0–100.0)
Monocytes Absolute: 0.5 10*3/uL (ref 0.1–1.0)
Monocytes Relative: 6 %
Neutro Abs: 6 10*3/uL (ref 1.7–7.7)
Neutrophils Relative %: 79 %
Platelets: 359 10*3/uL (ref 150–400)
RBC: 2.83 MIL/uL — ABNORMAL LOW (ref 3.87–5.11)
RDW: 14.6 % (ref 11.5–15.5)
WBC: 7.7 10*3/uL (ref 4.0–10.5)
nRBC: 0 % (ref 0.0–0.2)

## 2020-05-07 LAB — PROTIME-INR
INR: 3 — ABNORMAL HIGH (ref 0.8–1.2)
Prothrombin Time: 30.2 seconds — ABNORMAL HIGH (ref 11.4–15.2)

## 2020-05-07 MED ORDER — LOSARTAN POTASSIUM 50 MG PO TABS
50.0000 mg | ORAL_TABLET | Freq: Every day | ORAL | Status: DC
  Administered 2020-05-07 – 2020-05-12 (×6): 50 mg via ORAL
  Filled 2020-05-07 (×6): qty 1

## 2020-05-07 MED ORDER — PANTOPRAZOLE SODIUM 40 MG PO TBEC
40.0000 mg | DELAYED_RELEASE_TABLET | Freq: Every day | ORAL | Status: DC
Start: 2020-05-08 — End: 2020-05-22
  Administered 2020-05-08 – 2020-05-22 (×15): 40 mg via ORAL
  Filled 2020-05-07 (×15): qty 1

## 2020-05-07 NOTE — Progress Notes (Signed)
Occupational Therapy Session Note  Patient Details  Name: [name] MRN: [id number] Date of Birth: 1934/03/26  Today's Date: 05/07/2020 OT Individual Time: 1030-1100 OT Individual Time Calculation (min): 30 min    Short Term Goals: Week 1:  OT Short Term Goal 1 (Week 1): Pt will be able to rise to stand with RW with max A of 1 to prepare for toileting. OT Short Term Goal 2 (Week 1): Pt will be able to stand with RW and release 1 hand for clothing management with max A. OT Short Term Goal 3 (Week 1): Pt will be able to stand pivot with RW with max A of 1. OT Short Term Goal 4 (Week 1): Pt will demonstrate improved processing by donning shirt with min cues.  Skilled Therapeutic Interventions/Progress Updates:    Pt seen this session to focus on functional mobility to improve her ADL transfers.   Pt received in wc and discussed her progress taking 14 steps within parallel bars.  Encouraged pt to try a transfer of 6 steps to move from wc to arm chair.  She stood up with mod A to RW and then with extra time was able to take small slow steps to turn to R with mod A to sit in arm chair. Pt's pain tolerance fairly good but needed cues to push up through arms and hold posture upright.  Seated in arm chair, pt worked on shoulder pulls for her back using Level 1 theraband.  She then repeated transfer back to wc turning to her L. When she steps she tends to pull leg inward, needs cues to place leg under hip vs adducting.  Pt sat back in wc and was tired but said she felt good about what she accomplished.  Pt rated the difficulty of her transfer as a 6/10.    Pt resting in wc with all needs met.   Therapy Documentation Precautions:  Precautions Precautions: Fall Restrictions Weight Bearing Restrictions: Yes LUE Weight Bearing: Weight bearing as tolerated LLE Weight Bearing: Weight bearing as tolerated    Vital Signs: Therapy Vitals Temp: 98.1 F (36.7 C) Temp Source: Oral Pulse Rate:  76 Resp: 18 BP: (!) 175/62 Patient Position (if appropriate): Lying Oxygen Therapy SpO2: 97 % O2 Device: Room Air Pain: Pain Assessment Pain Scale: 0-10 Pain Score: 3  Pain Intervention(s): Medication (See eMAR)      Therapy/Group: Individual Therapy  [place] 05/07/2020, 9:00 AM

## 2020-05-07 NOTE — Progress Notes (Signed)
Patient ID: [name], female   DOB: 03-29-1934, 87 y.o.   MRN: [id number] Met with pt and husband to discuss team conference goals and whether discharge will be home versus SNF. Husband wants her walking a certain distance for her to be able to go home. He will measure for therapist's and let them know. Pt wants to go home versus going to a SNF. Pt is doing better and hopeful this will continue. Discharge disposition based upon how well she is doing and how far she is ambulating. Continue to work best plan for pt.

## 2020-05-07 NOTE — Progress Notes (Signed)
pts husband brought sleeping medication to patient, it was found by another day RN, charge nurse made aware and medication was taken. Education was given by the charge nurse to pt and husband to not bring unauthorized home medications for pt.   Dayna Ramus

## 2020-05-07 NOTE — Progress Notes (Signed)
Occupational Therapy Note  Patient Details  Name: [name] MRN: [id number] Date of Birth: 12-08-1933  Today's Date: 05/07/2020 OT Missed Time: 94 Minutes Missed Time Reason: Patient fatigue  Pt reports she did a lot of exercise today and feels very tired, more than usual.  Pt requesting to rest at this time. Missed treatment time: 30 minutes.   [name] Loreley Schwall 05/07/2020, 5:14 PM

## 2020-05-07 NOTE — Progress Notes (Signed)
Speech Language Pathology Daily Session Note  Patient Details  Name: [name] MRN: [id number] Date of Birth: [birth year]  Today's Date: 05/07/2020 SLP Individual Time: 2241-1464 SLP Individual Time Calculation (min): 26 min  Short Term Goals: Week 1: SLP Short Term Goal 1 (Week 1): Patient will particpiate in completing cognitive-linguistic testing. SLP Short Term Goal 2 (Week 1): Patient will demonstrate anticipatory awareness to deficits with minA cues SLP Short Term Goal 3 (Week 1): Patient will perform ADL's/functional tasks with supervision A.  Skilled Therapeutic Interventions: Pt was seen for skilled ST targeting cognitive goals. Pt's husband was present at bedside for session. SLP facilitated session with overall Supervision A level cues for problem solving and error awareness during a semi-complex monthly scheduling/calendar task. She also required initially Min but faded to Supervision A verbal cues to alternate her attention between the list of obligations/appointments to writing on the physical calendar. Pt left sitting in wheelchair with alarm set and needs within reach. Continue per current plan of care.       Pain Pain Assessment Pain Scale: Faces Faces Pain Scale: No hurt   Therapy/Group: Individual Therapy  [place] Leas 05/07/2020, 7:21 AM

## 2020-05-07 NOTE — Progress Notes (Signed)
[place] for Warfarin Indication: atrial fibrillation  Allergies  Allergen Reactions  . Pradaxa [Dabigatran Etexilate Mesylate] Other (See Comments)    INTERNAL BLEEDING  . Clindamycin/Lincomycin Other (See Comments)    PT STATES HER DOCTOR TOLD HER NOT TO TAKE CLINDAMYCIN BECAUSE SHE GOT C-DIFF AFTER TAKING AMPICILLIN- "tolerated in 2022" (per spouse)  . Latex Other (See Comments)    Blisters   . Ampicillin Other (See Comments)    C-DIF AFTER TAKING AMPICILLIN- "tolerated in 2022" (per husband) Has patient had a PCN reaction causing immediate rash, facial/tongue/throat swelling, SOB or lightheadedness with hypotension: No Has patient had a PCN reaction causing severe rash involving mucus membranes or skin necrosis: No Has patient had a PCN reaction that required hospitalization: No Has patient had a PCN reaction occurring within the last 10 years: No If all of the above answers are "NO", then may proceed with Cephalosporin u  . Sulfamethoxazole Diarrhea    Patient Measurements: Height: 5\' 4"  (162.6 cm) Weight: 86 kg (189 lb 9.6 oz) IBW/kg (Calculated) : 54.7  Vital Signs: Temp: 98.1 F (36.7 C) (02/09 0528) Temp Source: Oral (02/09 0528) BP: 175/62 (02/09 0528) Pulse Rate: 76 (02/09 0528)  Labs: Recent Labs    05/05/20 0542 05/06/20 0508 05/07/20 0443  HGB 7.8*  --  8.1*  HCT 24.4*  --  25.9*  PLT 343  --  359  LABPROT 26.1* 27.8* 30.2*  INR 2.5* 2.7* 3.0*  CREATININE  --   --  1.14*    Estimated Creatinine Clearance: 39 mL/min (A) (by C-G formula based on SCr of 1.14 mg/dL (H)).   Assessment: 87 year old with PMH significant for PAF on warfarin, recurrent UTI, HTN, HLD admitted to [facility] 04/22/2020 with acute closed left intratrochanteric hip fracture.Patient developed worsening dysphagia, MRI was obtain which showed acute stroke. Warfarin held per neurology recommendation. Pharmacy consulted to restart Warfarin on  1/31.  INR therapeutic at 3.0 today.  No bleeding reported  Goal of Therapy:  INR 2-3  Plan:  Continue home dose Warfarin 5mg  PO daily except 7.5mg  on Tues/Thurs Check INR every MWF, next due 2/11  Nicole Cella, RPh Clinical Pharmacist [phone]  Please check AMION for all [place] phone numbers After 10:00 PM, call [facility] 05/07/2020, 10:15 AM

## 2020-05-07 NOTE — Progress Notes (Signed)
Physical Therapy Session Note  Patient Details  Name: [name] MRN: [id number] Date of Birth: 31-Mar-1933  Today's Date: 05/07/2020 PT Individual Time: 0800-0855 PT Individual Time Calculation (min): 55 min   Short Term Goals: Week 2:  PT Short Term Goal 1 (Week 2): Pt will consistently perform bed mobility with modA PT Short Term Goal 2 (Week 2): Pt will consistently perform sit<>stand transfers with modA and LRAD PT Short Term Goal 3 (Week 2): Pt will perform bed<>chair transfers wtih modA PT Short Term Goal 4 (Week 2): Pt will ambulate 27ft with modA and LRAD  Skilled Therapeutic Interventions/Progress Updates:    Patient received in bathroom with RN + NT. Agreeable to PT. She denies pain in hip at rest, but reports "significantly more" pain with movement. RN present to provide AM rx including pain Rx. PT providing rest breaks, repositioning and distractions to assist with pain management. Patient able to remain sitting edge of bed with supervision to don shirt with supervision. MaxA to don pants starting in sitting with ModA to come to stand and MaxA to finish pulling up pants. Patient with moderate posterior bias in standing and difficulty coming to stand fully upright, which patient reports is due to pain. MaxA stand pivot to wc. PT propelling patient in therapy gym for time management and energy conservation. MaxA stand pivot to therapy mat with Max encouragement to weight bear through L LE. Seated LAQ and marches 2x12 with limited AROM to L LE. Standing tolerance/balance task with ModA to come to standing, MinA to remain standing with vebral cues for accuracy to place pegs in board. Patient maintains posterior bias in standing with minimal attempts to correct this despite verbal and tactile cues to do so. Wc mobility x44ft with MinA and very inefficient B UE strokes. Patient remaining up in wc, seatbelt alarm on, call light within reach.    Therapy Documentation Precautions:   Precautions Precautions: Fall Restrictions Weight Bearing Restrictions: Yes LUE Weight Bearing: Weight bearing as tolerated LLE Weight Bearing: Weight bearing as tolerated    Therapy/Group: Individual Therapy  Karoline Caldwell, PT, DPT, CBIS  05/07/2020, 7:43 AM

## 2020-05-07 NOTE — Progress Notes (Addendum)
[place] PHYSICAL MEDICINE & REHABILITATION PROGRESS NOTE  Subjective/Complaints: Patient seen sitting up in her chair this AM after working with therapies.  She states she slept fairly overnight due to positioning.  Husband at bedside. She complains of reflux.  Husband states she needs to be ind with rolling walker for a "long distance" prior to discharge.    ROS: Denies CP, SOB, N/V/D  Objective: Vital Signs: Blood pressure (!) 175/62, pulse 76, temperature 98.1 F (36.7 C), temperature source Oral, resp. rate 18, height 5\' 4"  (1.626 m), weight 86 kg, SpO2 97 %. No results found. Recent Labs    05/05/20 0542 05/07/20 0443  WBC 6.3 7.7  HGB 7.8* 8.1*  HCT 24.4* 25.9*  PLT 343 359   Recent Labs    05/07/20 0443  NA 140  K 3.9  CL 107  CO2 23  GLUCOSE 104*  BUN 23  CREATININE 1.14*  CALCIUM 8.4*    Intake/Output Summary (Last 24 hours) at 05/07/2020 1022 Last data filed at 05/07/2020 0947 Gross per 24 hour  Intake 120 ml  Output -  Net 120 ml        Physical Exam: BP (!) 175/62 (BP Location: Right Arm)   Pulse 76   Temp 98.1 F (36.7 C) (Oral)   Resp 18   Ht 5\' 4"  (1.626 m)   Wt 86 kg   SpO2 97%   BMI 32.54 kg/m   Constitutional: No distress . Vital signs reviewed. HENT: Normocephalic.  Atraumatic. Eyes: EOMI. No discharge. Cardiovascular: No JVD.  RRR. Respiratory: Normal effort.  No stridor.  Bilateral clear to auscultation. GI: Non-distended.  BS +. Skin: Warm and dry.  Intact. Psych: Normal mood.  Normal behavior. Musc: Left hip with edema and tenderness, unchanged Neuro: Alert Motor: B/l UE: 4/5 proximal distal Right lower extremity: Hip flexion, knee extension 2+-3 -/5, ankle dorsiflexion 4/5 (some pain inhibition), stable Left extremity: Hip flexion, knee extension 1+/5, ankle dorsiflexion 3/5 with pain inhibition Very HOH Right facial weakness, unchanged  Assessment/Plan: 1. Functional deficits which require 3+ hours per day of  interdisciplinary therapy in a comprehensive inpatient rehab setting.  Physiatrist is providing close team supervision and 24 hour management of active medical problems listed below.  Physiatrist and rehab team continue to assess barriers to discharge/monitor patient progress toward functional and medical goals   Care Tool:  Bathing    Body parts bathed by patient: Right arm,Left arm,Chest,Abdomen,Right upper leg,Left upper leg,Face,Front perineal area   Body parts bathed by helper: Buttocks,Right lower leg,Left lower leg     Bathing assist Assist Level: Moderate Assistance - Patient 50 - 74%     Upper Body Dressing/Undressing Upper body dressing   What is the patient wearing?: Pull over shirt    Upper body assist Assist Level: Set up assist    Lower Body Dressing/Undressing Lower body dressing      What is the patient wearing?: Pants,Incontinence brief     Lower body assist Assist for lower body dressing: Maximal Assistance - Patient 25 - 49%     Toileting Toileting    Toileting assist Assist for toileting: Maximal Assistance - Patient 25 - 49%     Transfers Chair/bed transfer  Transfers assist     Chair/bed transfer assist level: Maximal Assistance - Patient 25 - 49%     Locomotion Ambulation   Ambulation assist   Ambulation activity did not occur: Safety/medical concerns  Assist level: Minimal Assistance - Patient > 75% Assistive device: Parallel  bars Max distance: 10   Walk 10 feet activity   Assist  Walk 10 feet activity did not occur: Safety/medical concerns  Assist level: Minimal Assistance - Patient > 75% Assistive device: Parallel bars   Walk 50 feet activity   Assist Walk 50 feet with 2 turns activity did not occur: Safety/medical concerns         Walk 150 feet activity   Assist Walk 150 feet activity did not occur: Safety/medical concerns         Walk 10 feet on uneven surface  activity   Assist Walk 10 feet on  uneven surfaces activity did not occur: Safety/medical concerns         Wheelchair     Assist Will patient use wheelchair at discharge?: Yes (Per PT long term goals) Type of Wheelchair: Manual    Wheelchair assist level: Minimal Assistance - Patient > 75% Max wheelchair distance: 87ft    Wheelchair 50 feet with 2 turns activity    Assist        Assist Level: Total Assistance - Patient < 25%   Wheelchair 150 feet activity     Assist      Assist Level: Total Assistance - Patient < 25%    Medical Problem List and Plan: 1.  Decreased functional ability secondary to left intertrochanteric femur fracture as well as history of left femur fracture May 2019 status post IM nailing as well as removal of hardware left femur from previous fracture 04/23/2020.  Weightbearing as tolerated.  Hospital course complicated by postoperative small acute infarct right cerebellum, bilateral occipital parietal lobes in the high left precentral gyrus.  Continue CIR  Team conference today to discuss current and goals and coordination of care, home and environmental barriers, and discharge planning with nursing, case manager, and therapies. Please see conference note from today as well.  2.  Antithrombotics: -DVT/anticoagulation: Chronic Coumadin  INR therapeutic on 2/9             -antiplatelet therapy: On Coumadin 3. Pain Management: Oxycodone as needed, Robaxin as needed  See #17  Relatively controlled with meds on 2/9  Discussed positioning in bed. Needs to utilize ice also 4. Mood: Provide emotional support             -antipsychotic agents: N/A 5. Neuropsych: This patient is ?fully capable of making decisions on her own behalf. 6.  Acute blood loss anemia.    Hemoglobin 8.1 on 2/9 7.  Atrial fibrillation.  Toprol-XL 25 mg daily.  Continue Coumadin.  Cardiac rate controlled  HR controlled on 2/9  Monitor with increased activity 8. Hypertension: Chlorthalidone losartan currently  on hold.  Continue Toprol  Losartan 25, increased to 50 on 2/9 9.  CKD stage III.  Baseline creatinine ?1.46.    Creatinine 1.14 on 2/9  Continue to monitor 10.  Hyperlipidemia: Lipitor 11. Recurrent UTIs.  Latest urinalysis study negative.   12. Skin/Wound Care: Routine skin checks 13. Fluids/Electrolytes/Nutrition: Routine in and outs 14. Very hard of hearing- talk to her LEFT ear.  15.  Chronic diarrhea: used Imodium 2 mg BID daily at home  16. Leg length discrepancy- L shorter than Right- husband to bring in appropriate lift shoes.  17. L shoulder bicipital tendinitis based on clinical exam  Voltaren gel 2G QID 18.  Congestion  Flonase ordered  Improved 19. GERD  Timing adjusted per patient/daughter  LOS: 8 days A FACE TO FACE EVALUATION WAS PERFORMED  Chyna Kneece Lorie Phenix 05/07/2020, 10:22  AM

## 2020-05-07 NOTE — Patient Care Conference (Signed)
Inpatient RehabilitationTeam Conference and Plan of Care Update Date: 05/07/2020   Time: 11:23 AM    Patient Name: [name]      Medical Record Number: [id number]  Date of Birth: [birth year] Sex: Female         Room/Bed: 4W23C/4W23C-01 Payor Info: Payor: MEDICARE / Plan: MEDICARE PART A AND B / Product Type: *No Product type* /    Admit Date/Time:  04/29/2020  4:16 PM  Primary Diagnosis:  Intertrochanteric fracture of left hip [facility])  Hospital Problems: Principal Problem:   Intertrochanteric fracture of left hip (HCC) Active Problems:   Pressure injury of skin   Congestion of nasal sinus   Stage 3 chronic kidney disease (HCC)   Benign essential HTN   Labile blood pressure   Anemia, chronic disease   Essential hypertension   PAF (paroxysmal atrial fibrillation) ([place])   Post-operative pain   Gastroesophageal reflux disease    Expected Discharge Date: Expected Discharge Date: 05/15/20 (Discharge to SNF pending)  Team Members Present: Physician leading conference: Dr. Delice Lesch Care Coodinator Present: Dorien Chihuahua, RN, BSN, CRRN;Becky Dupree, LCSW Nurse Present: Pamella Pert) [place], LPN PT Present: Ginnie Smart, PT OT Present: Meriel Pica, OT SLP Present: Jettie Booze, CF-SLP PPS Coordinator present : Gunnar Fusi, SLP     Current Status/Progress Goal Weekly Team Focus  Bowel/Bladder   Patient is continent of B/B.  Patient will be continent of B/B.  Toilet Pt. every 2 hrs and PRN.   Swallow/Nutrition/ Hydration             ADL's             Mobility   mod/maxA bed mobility, minA sitting balance, maxA sit<>stand without AD but minA in // bars. MaxA stand<>pivot transfer but have consistently been using Stedy for safety. Gait up to 51ft with modA in // bars  modA overall  LLE pain management, weight bearing through LLE, LLE ROM/strengthening, gait training as able.   Communication             Safety/Cognition/ Behavioral Observations  Supervision-Min A   mod I  basic to semi-complex problem solving, anticipatory awareness   Pain   Patient is requesting PRN pain meds every 6 hrs, stating pain is 7-9.  Keep pain at or <3.  Assess pain Q shift and PRN.   Skin               Discharge Planning:  Plan to go home with husband who can assist, but may not be able to provide mod assist level. He is here daily and asking when will walk. Will need much family training prior to discharge   Team Discussion: CKD baseline, and MD adjusting meds for HTN and rechecking labs. Pain managed with prn medication. Fear limits progress. Patient on target to meet rehab goals: Limited progress noted with functional ambulation. Anticipate will be able to ambulate at a household level at discharge. Currently mod assist for transfers, min assist for 4-6 steps and mod-max assist for self care however husband assisted patient PTA with clothes/shoes, etc.  *See Care Plan and progress notes for long and short-term goals.   Revisions to Treatment Plan:   Teaching Needs: Transfers, toileting, medications, safety, etc.  Current Barriers to Discharge: Decreased caregiver support, Home enviroment access/layout, Behavior and husband notes extended distance between driveway-home and in home between bedrooms and bathrooms to [employer] coverage for SNF placement  Possible Resolutions to Barriers: SNF recommended Family education  Medical Summary Current Status: Decreased functional ability secondary to left intertrochanteric femur fracture as well as history of left femur fracture May 2019 status post IM nailing as well as removal of hardware left femur from previous fracture 04/23/2020.  Weightbearing as tolerated.  Hospital course complicated by postoperative small acute infarct right cerebellum, bilateral occipital parietal lobes in the high left precentral gyrus.  Barriers to Discharge: Medical stability;Decreased family/caregiver support;Weight;Behavior;Home  enviroment access/layout   Possible Resolutions to [employer] Focus: Therapies, optimize BP meds, follow labs - Cr, Hb   Continued Need for Acute Rehabilitation Level of Care: The patient requires daily medical management by a physician with specialized training in physical medicine and rehabilitation for the following reasons: Direction of a multidisciplinary physical rehabilitation program to maximize functional independence : Yes Medical management of patient stability for increased activity during participation in an intensive rehabilitation regime.: Yes Analysis of laboratory values and/or radiology reports with any subsequent need for medication adjustment and/or medical intervention. : Yes   I attest that I was present, lead the team conference, and concur with the assessment and plan of the team.   Dorien Chihuahua B 05/07/2020, 2:03 PM

## 2020-05-07 NOTE — Progress Notes (Signed)
Physical Therapy Weekly Progress Note  Patient Details  Name: Tangala Wiegert MRN: [id number] Date of Birth: 1933/06/30  Beginning of progress report period: April 30, 2020 End of progress report period: May 07, 2020  Today's Date: 05/07/2020 PT Individual Time: 1400-1455 PT Individual Time Calculation (min): 55 min   Patient has met 3 of 3 short term goals.  Pt is making slow but steady progress towards her goals. She continues to be primarily limited by pain inhibition in L leg, poor activity/pain tolerance, limited ability to weight shift onto L leg, and general deconditioning. She is able to complete bed mobility with maxA, sit<>stand transfers with maxA using a RW but minA using // bars, and has initiated gait training in // bars with min/modA.   Patient continues to demonstrate the following deficits muscle weakness, decreased cardiorespiratoy endurance, unbalanced muscle activation, decreased problem solving, decreased memory and delayed processing and decreased standing balance and decreased balance strategies and therefore will continue to benefit from skilled PT intervention to increase functional independence with mobility.  Patient progressing toward long term goals..  Continue plan of care.  PT Short Term Goals Week 2:  PT Short Term Goal 1 (Week 2): Pt will consistently perform bed mobility with modA PT Short Term Goal 2 (Week 2): Pt will consistently perform sit<>stand transfers with modA and LRAD PT Short Term Goal 3 (Week 2): Pt will perform bed<>chair transfers wtih modA PT Short Term Goal 4 (Week 2): Pt will ambulate 63f with modA and LRAD  Skilled Therapeutic Interventions/Progress Updates:    Pt received sitting upright in w/c, awake and agreeable to therapy. Husband at bedside. Spoke with him regarding DC planning and home measurements. He will reports he will measure distances needed for patient to ambulate within the home. Pt transported for time to main rehab gym  and wheeled inside // bars. Performed sit<>stand with minA in // bars with heavy reliance of UE to pull herself up. Once standing, required CGA for steadying. Gait training x3 bouts the length of the // bars (~962f with CGA. Demo's antalgic gait with decreased L weight shift resulting in poor R foot clearance and step length however cadence and tolerance to weight bearing is improved from prior sessions. She then performed pre-gait training within // bars, performing forward/backward stepping with R foot and emphasizing lateral weight shift to L to promote R foot clearance in swing, pain limiting. She then performed lateral side stepping with her L foot and again emphasized L weight shift during lateral stepping, demonstrating much improved weight shift and ability to tolerate. She then performed repeated sit<>stands in // bars with minA but focusing on decreased reliance of BUE's to pull herself up by placing her L hand on w/c armrest and allowing RUE to // bar to assist pulling. Pt transported back to her room, ended session seated in w/c with needs in reach, husband updated on pt's session.  Therapy Documentation Precautions:  Precautions Precautions: Fall Restrictions Weight Bearing Restrictions: Yes LUE Weight Bearing: Weight bearing as tolerated LLE Weight Bearing: Weight bearing as tolerated  Therapy/Group: Individual Therapy  Andie Mortimer P Christo Hain PT 05/07/2020, 7:25 AM

## 2020-05-08 LAB — GLUCOSE, CAPILLARY
Glucose-Capillary: 106 mg/dL — ABNORMAL HIGH (ref 70–99)
Glucose-Capillary: 149 mg/dL — ABNORMAL HIGH (ref 70–99)

## 2020-05-08 NOTE — Progress Notes (Signed)
[place] PHYSICAL MEDICINE & REHABILITATION PROGRESS NOTE  Subjective/Complaints: Patient seen sitting up in bed this morning.  She states she did not sleep well overnight, but cannot elaborate on why.  She is about to work with therapies.  Husband states she needs to ambulate 80 feet.  He also states that she needs to be able to use handrails on 1 side and the wall on the other to navigate 5 steps so that she may be able to go out to dinner with friends.  Discussed rehab goals again with husband.  ROS: Denies CP, SOB, N/V/D  Objective: Vital Signs: Blood pressure (!) 112/44, pulse 66, temperature 97.6 F (36.4 C), resp. rate 18, height 5\' 4"  (1.626 m), weight 81.8 kg, SpO2 100 %. No results found. Recent Labs    05/07/20 0443  WBC 7.7  HGB 8.1*  HCT 25.9*  PLT 359   Recent Labs    05/07/20 0443  NA 140  K 3.9  CL 107  CO2 23  GLUCOSE 104*  BUN 23  CREATININE 1.14*  CALCIUM 8.4*    Intake/Output Summary (Last 24 hours) at 05/08/2020 1444 Last data filed at 05/08/2020 1437 Gross per 24 hour  Intake 450 ml  Output --  Net 450 ml        Physical Exam: BP (!) 112/44 (BP Location: Right Arm)   Pulse 66   Temp 97.6 F (36.4 C)   Resp 18   Ht 5\' 4"  (1.626 m)   Wt 81.8 kg   SpO2 100%   BMI 30.95 kg/m   Constitutional: No distress . Vital signs reviewed. HENT: Normocephalic.  Atraumatic. Eyes: EOMI. No discharge. Cardiovascular: No JVD.  RRR. Respiratory: Normal effort.  No stridor.  Bilateral clear to auscultation. GI: Non-distended.  BS +. Skin: Warm and dry.  Intact. Psych: Normal mood.  Normal behavior. Musc: Left hip with edema and tenderness, some improvement Neuro: Alert Motor: B/l UE: 4/5 proximal distal Right lower extremity: Hip flexion, knee extension 2+-3 -/5, ankle dorsiflexion 4/5 (some pain inhibition), unchanged Left extremity: Hip flexion, knee extension 1+/5, ankle dorsiflexion 3/5 with pain inhibition, unchanged Very HOH Right facial  weakness, unchanged  Assessment/Plan: 1. Functional deficits which require 3+ hours per day of interdisciplinary therapy in a comprehensive inpatient rehab setting.  Physiatrist is providing close team supervision and 24 hour management of active medical problems listed below.  Physiatrist and rehab team continue to assess barriers to discharge/monitor patient progress toward functional and medical goals   Care Tool:  Bathing    Body parts bathed by patient: Right arm,Left arm,Chest,Abdomen,Right upper leg,Left upper leg,Face,Front perineal area   Body parts bathed by helper: Buttocks,Right lower leg,Left lower leg     Bathing assist Assist Level: Moderate Assistance - Patient 50 - 74%     Upper Body Dressing/Undressing Upper body dressing   What is the patient wearing?: Pull over shirt    Upper body assist Assist Level: Set up assist    Lower Body Dressing/Undressing Lower body dressing      What is the patient wearing?: Pants,Incontinence brief     Lower body assist Assist for lower body dressing: Maximal Assistance - Patient 25 - 49%     Toileting Toileting    Toileting assist Assist for toileting: Maximal Assistance - Patient 25 - 49%     Transfers Chair/bed transfer  Transfers assist     Chair/bed transfer assist level: Maximal Assistance - Patient 25 - 49%     Locomotion  Ambulation   Ambulation assist   Ambulation activity did not occur: Safety/medical concerns  Assist level: Minimal Assistance - Patient > 75% Assistive device: Parallel bars Max distance: 10   Walk 10 feet activity   Assist  Walk 10 feet activity did not occur: Safety/medical concerns  Assist level: Minimal Assistance - Patient > 75% Assistive device: Parallel bars   Walk 50 feet activity   Assist Walk 50 feet with 2 turns activity did not occur: Safety/medical concerns         Walk 150 feet activity   Assist Walk 150 feet activity did not occur:  Safety/medical concerns         Walk 10 feet on uneven surface  activity   Assist Walk 10 feet on uneven surfaces activity did not occur: Safety/medical concerns         Wheelchair     Assist Will patient use wheelchair at discharge?: Yes Type of Wheelchair: Manual    Wheelchair assist level: Minimal Assistance - Patient > 75% Max wheelchair distance: 50    Wheelchair 50 feet with 2 turns activity    Assist        Assist Level: Minimal Assistance - Patient > 75%   Wheelchair 150 feet activity     Assist      Assist Level: Total Assistance - Patient < 25%    Medical Problem List and Plan: 1.  Decreased functional ability secondary to left intertrochanteric femur fracture as well as history of left femur fracture May 2019 status post IM nailing as well as removal of hardware left femur from previous fracture 04/23/2020.  Weightbearing as tolerated.  Hospital course complicated by postoperative small acute infarct right cerebellum, bilateral occipital parietal lobes in the high left precentral gyrus.  Continue CIR 2.  Antithrombotics: -DVT/anticoagulation: Chronic Coumadin  INR therapeutic on 2/9, no labs for today             -antiplatelet therapy: On Coumadin 3. Pain Management: Oxycodone as needed, Robaxin as needed  See #17  Relatively controlled with meds on 2/10  Discussed positioning in bed. Needs to utilize ice also 4. Mood: Provide emotional support             -antipsychotic agents: N/A 5. Neuropsych: This patient is ?fully capable of making decisions on her own behalf. 6.  Acute blood loss anemia.    Hemoglobin 8.1 on 2/9 7.  Atrial fibrillation.  Toprol-XL 25 mg daily.  Continue Coumadin.  Cardiac rate controlled  HR controlled on 2/10  Monitor with increased activity 8. Hypertension: Chlorthalidone losartan currently on hold.  Continue Toprol  Losartan 25, increased to 50 on 2/9  Labile on 2/10, monitor for trend 9.  CKD stage III.   Baseline creatinine ?1.46.    Creatinine 1.14 on 2/9  Continue to monitor 10.  Hyperlipidemia: Lipitor 11. Recurrent UTIs.  Latest urinalysis study negative.   12. Skin/Wound Care: Routine skin checks 13. Fluids/Electrolytes/Nutrition: Routine in and outs 14. Very hard of hearing- talk to her LEFT ear.  15.  Chronic diarrhea: used Imodium 2 mg BID daily at home  16. Leg length discrepancy- L shorter than Right- husband to bring in appropriate lift shoes.  17. L shoulder bicipital tendinitis based on clinical exam  Voltaren gel 2G QID 18.  Congestion  Flonase ordered  Improved 19. GERD  Timing adjusted per patient/husband-husband would like medication at 715-discussed limitations in giving medication at an exact time on a consistent basis  Appears to  be improving  LOS: 9 days A FACE TO FACE EVALUATION WAS PERFORMED  Tyriek Hofman Lorie Phenix 05/08/2020, 2:44 PM

## 2020-05-08 NOTE — Progress Notes (Signed)
Patient ID: [name], female   DOB: Dec 10, 1933, 87 y.o.   MRN: [id number] Husband has measured the distance he needs pt to ambulate. It is 57 ft and ants her to be able to go up four steps with a rail so can go to friends'; home for dinner, for which they do weekly.

## 2020-05-08 NOTE — Progress Notes (Signed)
Speech Language Pathology Discharge Summary  Patient Details  Name: [name] MRN: [id number] Date of Birth: 04/15/1933  Today's Date: 05/08/2020 SLP Individual Time: 1443-2469 SLP Individual Time Calculation (min): 30 min   Skilled Therapeutic Interventions:  Patient seen with spouse present in room for skilled ST session. Session was focused on completing two subtests from ALFA as well as patient and spouse education. Patient demonstrated improved accuracy and overall performance with test questions as compared to initial testing. SLP to discharge patient due to goals met, patient functioning at baseline.     Patient has met 2 of 2 long term goals.  Patient to discharge at overall Supervision;Modified Independent level.  Reasons goals not met: N/A   Clinical Impression/Discharge Summary: Patient appears to be at her cognitive-linguistic baseline and is in range of supervision to modified independent with mild complex problem solving, reasoning and modified independent with basic problem solving, reasoning. Her husband stated that she is at her baseline and in addition, he already helps her at home with medication/pill box management and he manages the finances. Patient retested with ALFA Reading Instructions subtest and scored 80% (previous score had been 40%); She received a score of 100% on using a calendar task on ALFA. Patient appropriate for discharge from [facility] at this time.  Care Partner:  Caregiver Able to Provide Assistance: Yes  Type of Caregiver Assistance: Cognitive;Physical  Recommendation:  None      Equipment: None   Reasons for discharge: Treatment goals met        Sonia Baller, MA, CCC-SLP Speech Therapy

## 2020-05-08 NOTE — Progress Notes (Signed)
Physical Therapy Session Note Performed as makeup session for missed time during week  Patient Details  Name: [name] MRN: [id number] Date of Birth: 17-Jun-1933  Today's Date: 05/08/2020 PT Individual Time: 1115-1155 PT Individual Time Calculation ([name]): 40 [name]   Short Term Goals: Week 1:  PT Short Term Goal 1 (Week 1): Pt will complete bed mobility with maxA PT Short Term Goal 2 (Week 1): Pt will complete bed<>chair transfers with maxA and LRAD PT Short Term Goal 3 (Week 1): Pt will initiate gait training with LRAD Week 2:  PT Short Term Goal 1 (Week 2): Pt will consistently perform bed mobility with modA PT Short Term Goal 2 (Week 2): Pt will consistently perform sit<>stand transfers with modA and LRAD PT Short Term Goal 3 (Week 2): Pt will perform bed<>chair transfers wtih modA PT Short Term Goal 4 (Week 2): Pt will ambulate 34ft with modA and LRAD Week 3:     Skilled Therapeutic Interventions/Progress Updates:    PAIN Initially states pain is "not bad" but numver not given.  Declined pain meds to nursing and treeatment to tolerance.  Pt initially oob in wc.  Husband very concerned w/timing of pm pain meds, nursing in to discuss.  Pt agreeable to unscheduled makeup session after explaining purpose/reason for due to missed time.   Pt transported to gym for therex/standing activities. In wc performed bilat heel raise, toe raises, and L knee flex/extension first partial ROM then due to pain changed to w/foot on floor w/sliding board/towel to decrease friction.  Sit to stand in parallel bars repeated w/hands on bars, hands on wc, mod assist, cues to increase ant wt shift w/transition. Pt stood 1 [name] x 1 but c/o urinary urgency in standing.  Repeated transition 5-6 times then requested to return to room for bathroom use. Pt propels wc x 15 ft very slow progress using UEs, inefficient stroke length. Sit to stand in steady w/[name] assist.  Commode transfer w/steady [name] assist for balance  and clothing management, pt w/single UE support on steady w/this. Pt continent of bowel and bladder. Sit to stand in steady w/mod assist from commode, total assist for pericare and mod assist for clothing management.   Commode to wc via Steady. Pt left oob in wc w/alarm belt set and needs in reach   Therapy Documentation Precautions:  Precautions Precautions: Fall Restrictions Weight Bearing Restrictions: Yes LUE Weight Bearing: Weight bearing as tolerated LLE Weight Bearing: Weight bearing as tolerated    Therapy/Group: Individual Therapy  Callie Fielding, [place] 05/08/2020, 12:14 PM

## 2020-05-08 NOTE — Progress Notes (Signed)
Physical Therapy Session Note  Patient Details  Name: [name] MRN: [id number] Date of Birth: March 17, 1934  Today's Date: 05/08/2020 PT Individual Time: 1300-1410 PT Individual Time Calculation (min): 70 min   Short Term Goals: Week 2:  PT Short Term Goal 1 (Week 2): Pt will consistently perform bed mobility with modA PT Short Term Goal 2 (Week 2): Pt will consistently perform sit<>stand transfers with modA and LRAD PT Short Term Goal 3 (Week 2): Pt will perform bed<>chair transfers wtih modA PT Short Term Goal 4 (Week 2): Pt will ambulate 42ft with modA and LRAD  Skilled Therapeutic Interventions/Progress Updates:    Pt received sitting upright in w/c. Husband at bedside. Pt agreeable to therapy and reports only mild L leg pain. Per chart, she received Oxycodone ~30 minutes prior to arrival. Rest breaks, emotional support, and mobility provided for pain management. W/c transport for time management to main therapy gym. Stand<>pivot with modA and RW from w/c to mat table, show's very limited L weight shift and very slow movement, antalgic. Performed gentle LAQ, hip flexion, and heel raises in available and pain free range of motion. Performed repeated sit<>stands, 1x5 from slightly elevated mat table height to RW, cues for hand placement and forward weight shift. She was initially able to complete this with minA but progressed to modA with fatigue. Noted considerable off-loading of LLE during sit to stands and had difficulty correcting due to pain inhibition. Pt then reporting need to go to the bathroom. Stand<>Pivot with modA and RW from mat table to w/c and she was transported back to her room with [place] for time management. Used Stedy due to urgency of toileting, requiring minA for [place] transfer. She was able to stand in perched position with CGA. Required modA for lowering pants while standing STedy but able to sit with minA for controlled lowering. Pt continent of bladder and bowel, charted.  She required totalA for posterior pericare while she stood in [place] with CGA. Stedy transfer back to her w/c and transported back to rehab gym. Gait training x40ft with min/modA and RW. Antalgic gait pattern and short shuffling steps, cues for forward gaze, lateral weight shifts to advance R foot. Extended seated rest break provided before gait training x63ft with min/modA and RW, similar cues provided as above. Transported back to her room in w/c and she remained seated in w/c with needs in reach. Husband asking about patients ability to navigate 4 steps prior to DC. Educated on progress she's made and barriers to safe stair negotiation. He appeared to have good understanding.   Therapy Documentation Precautions:  Precautions Precautions: Fall Restrictions Weight Bearing Restrictions: Yes LUE Weight Bearing: Weight bearing as tolerated LLE Weight Bearing: Weight bearing as tolerated General:    Therapy/Group: Individual Therapy  Deshawnda Acrey P Markesia Crilly PT 05/08/2020, 7:49 AM

## 2020-05-08 NOTE — Progress Notes (Signed)
Occupational Therapy Weekly Progress Note  Patient Details  Name: [name] MRN: [id number] Date of Birth: 1933/11/14  Beginning of progress report period: April 30, 2020 End of progress report period: May 08, 2020  Today's Date: 05/08/2020 OT Individual Time: 6644-0347 OT Individual Time Calculation (min): 60 min    Patient has met 4 of 4 short term goals.  Pt has been making very slow, but gradual progress. She is tolerating movement to her LLE more and tolerates putting more weight on her LLE.  She has been able to do a stand pivot with RW but needs max A due to difficulty advancing legs. Her poor posture and endurance also makes her movements very challenging.     Patient continues to demonstrate the following deficits: muscle weakness and muscle joint tightness, decreased cardiorespiratoy endurance and decreased standing balance and decreased balance strategies and therefore will continue to benefit from skilled OT intervention to enhance overall performance with BADL.  Patient progressing toward long term goals..  Continue plan of care.  OT Short Term Goals Week 1:  OT Short Term Goal 1 (Week 1): Pt will be able to rise to stand with RW with max A of 1 to prepare for toileting. OT Short Term Goal 1 - Progress (Week 1): Met OT Short Term Goal 2 (Week 1): Pt will be able to stand with RW and release 1 hand for clothing management with max A. OT Short Term Goal 2 - Progress (Week 1): Met OT Short Term Goal 3 (Week 1): Pt will be able to stand pivot with RW with max A of 1. OT Short Term Goal 3 - Progress (Week 1): Met OT Short Term Goal 4 (Week 1): Pt will demonstrate improved processing by donning shirt with min cues. OT Short Term Goal 4 - Progress (Week 1): Met Week 2:  OT Short Term Goal 1 (Week 2): Pt will complete toilet transfer with a stand pivot with mod A or less on a consistent basis. OT Short Term Goal 2 (Week 2): Pt will be able to ambulate 8 feet to be able to  access her bathroom. OT Short Term Goal 3 (Week 2): Pt will be able to stand and pull pants down to prepare for toileting. OT Short Term Goal 4 (Week 2): Pt will be able to pull pants up with mod A.  Skilled Therapeutic Interventions/Progress Updates:    Pt received in bed and anxious to take a shower.  Pt needed mod A to sit to EOB, mod to stand to RW but then max A to step pivot to w/c.  Pt had great difficulty fully putting weight on LLE and advancing R leg.  Had her do same transfer to toilet but again it was very difficult.  To get to shower, used Stedy to move to bench.  Showered and washed hair and then used stedy to transfer back to wc to dress. See ADL documentation below for details.   Pt did very well pulling up to stand in stedy with no assist and was able to stand fully.   She continues to need significant assist with LB dressing and self care. Pt resting in wc with all needs met.  Spouse in the room with her husband.  Therapy Documentation Precautions:  Precautions Precautions: Fall Restrictions Weight Bearing Restrictions: Yes LUE Weight Bearing: Weight bearing as tolerated LLE Weight Bearing: Weight bearing as tolerated  Pain: 4/10 pain LLE - premedicated   ADL: ADL Eating: Set up Grooming: Setup  Upper Body Bathing: Supervision/safety Where Assessed-Upper Body Bathing: Shower Lower Body Bathing: Moderate assistance Where Assessed-Lower Body Bathing: Shower Upper Body Dressing: Supervision/safety Where Assessed-Upper Body Dressing: Wheelchair Lower Body Dressing: Maximal assistance Where Assessed-Lower Body Dressing: Wheelchair Toileting: Maximal assistance Where Assessed-Toileting: Glass blower/designer: Maximal Print production planner Method: Arts development officer: Energy manager Method:  (used stedy) Youth worker: Radio broadcast assistant   Therapy/Group: Individual Therapy  [place] 05/08/2020, 12:34 PM

## 2020-05-08 NOTE — Progress Notes (Signed)
Physical Therapy Session Note  Patient Details  Name: [name] MRN: [id number] Date of Birth: 05-06-1933  Today's Date: 05/08/2020 PT Individual Time: 1005-1030 PT Individual Time Calculation (min): 25 min   Short Term Goals: Week 2:  PT Short Term Goal 1 (Week 2): Pt will consistently perform bed mobility with modA PT Short Term Goal 2 (Week 2): Pt will consistently perform sit<>stand transfers with modA and LRAD PT Short Term Goal 3 (Week 2): Pt will perform bed<>chair transfers wtih modA PT Short Term Goal 4 (Week 2): Pt will ambulate 54ft with modA and LRAD  Skilled Therapeutic Interventions/Progress Updates:   Pt received sitting in WC and agreeable to PT. Pt transported to rehab gym in [facility]. Seated BLE therex, LAQ, reciprocal march, and hip abduction within available range x 10 with cues for full ROM and AAROM on the L to increase range on the L. Gait training with RW 2 x 40ft with mod assist for safety and cues for improved use of UE to manage pain with WB in the LLE. Sit<>stand transfer training x 3 with mod assist overall and cues for proper LE placement and improved anterior weight weight.         Therapy Documentation Precautions:  Precautions Precautions: Fall Restrictions Weight Bearing Restrictions: Yes LUE Weight Bearing: Weight bearing as tolerated LLE Weight Bearing: Weight bearing as tolerated   Pain: Pain Assessment Pain Scale: 0-10 Pain Score: 4  Pain Type: Acute pain Pain Location: Hip Pain Intervention(s): Medication (See eMAR)   Therapy/Group: Individual Therapy  Lorie Phenix 05/08/2020, 10:36 AM

## 2020-05-09 DIAGNOSIS — N390 Urinary tract infection, site not specified: Secondary | ICD-10-CM

## 2020-05-09 LAB — URINALYSIS, ROUTINE W REFLEX MICROSCOPIC
Bilirubin Urine: NEGATIVE
Glucose, UA: NEGATIVE mg/dL
Ketones, ur: NEGATIVE mg/dL
Nitrite: POSITIVE — AB
Protein, ur: 100 mg/dL — AB
Specific Gravity, Urine: 1.02 (ref 1.005–1.030)
pH: 6.5 (ref 5.0–8.0)

## 2020-05-09 LAB — CBC
HCT: 26.5 % — ABNORMAL LOW (ref 36.0–46.0)
Hemoglobin: 8 g/dL — ABNORMAL LOW (ref 12.0–15.0)
MCH: 28.6 pg (ref 26.0–34.0)
MCHC: 30.2 g/dL (ref 30.0–36.0)
MCV: 94.6 fL (ref 80.0–100.0)
Platelets: 342 10*3/uL (ref 150–400)
RBC: 2.8 MIL/uL — ABNORMAL LOW (ref 3.87–5.11)
RDW: 14.7 % (ref 11.5–15.5)
WBC: 6.8 10*3/uL (ref 4.0–10.5)
nRBC: 0 % (ref 0.0–0.2)

## 2020-05-09 LAB — URINALYSIS, MICROSCOPIC (REFLEX): WBC, UA: >50 WBC/hpf (ref 0–5)

## 2020-05-09 LAB — PROTIME-INR
INR: 3.6 — ABNORMAL HIGH (ref 0.8–1.2)
Prothrombin Time: 35.1 seconds — ABNORMAL HIGH (ref 11.4–15.2)

## 2020-05-09 NOTE — Progress Notes (Signed)
Occupational Therapy Session Note  Patient Details  Name: [name] MRN: [id number] Date of Birth: 02-25-1934  Today's Date: 05/09/2020 OT Individual Time: 3833-3832 OT Individual Time Calculation (min): 65 min    Short Term Goals: Week 2:  OT Short Term Goal 1 (Week 2): Pt will complete toilet transfer with a stand pivot with mod A or less on a consistent basis. OT Short Term Goal 2 (Week 2): Pt will be able to ambulate 8 feet to be able to access her bathroom. OT Short Term Goal 3 (Week 2): Pt will be able to stand and pull pants down to prepare for toileting. OT Short Term Goal 4 (Week 2): Pt will be able to pull pants up with mod A.  Skilled Therapeutic Interventions/Progress Updates:    Pt received in bed ready for therapy.  Min A to sit to EOB helping her bring LLE towards edge. It took her quite some time, but with HOB elevated and using rail she was able to sit to EOB. CGA to stand to RW from elevated bed and CGA stand pivoting taking 6 steps to w/c!  Cued pt to take her time and then did not continue cuing.   She said she did not need to urgently use bathroom so had sit in wc to complete oral care and UB bathing and dressing with set up.  She then completed stand pivot with RW to toilet with only CGA, urinated on toilet (her brief was wet already), then only SUPERVISION to stand from toilet and CGA back to w/c.  Tolerated standing for several minutes to cleanse with wash cloths (S) and don brief (max A).  Sat in w/c to rub lotion on legs up to ankles, therapist assisted with feet.   She practiced using a reacher to don pants over feet with mod A, stood with min A initially leaning back over heels needing cues to put weight over balls of feet.  Pulled pants over hips with min A and extra time.    Pt participated well. Resting in wc with spouse in the room.      Therapy Documentation Precautions:  Precautions Precautions: Fall Restrictions Weight Bearing Restrictions:  Yes LUE Weight Bearing: Weight bearing as tolerated LLE Weight Bearing: Weight bearing as tolerated  Pain: Pain Assessment Pain Scale: 0-10 Pain Score: 6  Pain Location: Hip Pain Intervention(s): Medication (See eMAR) ADL: ADL Eating: Set up Grooming: Setup Upper Body Bathing: Supervision/safety Where Assessed-Upper Body Bathing: Shower Lower Body Bathing: Moderate assistance Where Assessed-Lower Body Bathing: Shower Upper Body Dressing: Supervision/safety Where Assessed-Upper Body Dressing: Wheelchair Lower Body Dressing: Maximal assistance Where Assessed-Lower Body Dressing: Wheelchair Toileting: Maximal assistance Where Assessed-Toileting: Glass blower/designer: Maximal Print production planner Method: Arts development officer: Energy manager Method:  (used stedy) Youth worker: Radio broadcast assistant   Therapy/Group: Individual Therapy  [place] 05/09/2020, 8:50 AM

## 2020-05-09 NOTE — Progress Notes (Signed)
[facility] PHYSICAL MEDICINE & REHABILITATION PROGRESS NOTE  Subjective/Complaints: Patient seen sitting up in bed this morning.  She states she slept fairly well overnight.  She complains of dysuria husband approaches manifestation to state that patient has a UTI needs work-up.  Informed husband I would evaluate patient.  Husband returns and wait for me outside of the patient's friends to inform me that patient needs to be evaluated because she can be going to therapy.  Informed husband that it was not appropriate for him to stand outside the patient's room.  ROS: + Dysuria.  Denies CP, SOB, N/V/D  Objective: Vital Signs: Blood pressure (!) 160/60, pulse 74, temperature 98 F (36.7 C), resp. rate 19, height 5\' 4"  (1.626 m), weight 84.7 kg, SpO2 92 %. No results found. Recent Labs    05/07/20 0443 05/09/20 0456  WBC 7.7 6.8  HGB 8.1* 8.0*  HCT 25.9* 26.5*  PLT 359 342   Recent Labs    05/07/20 0443  NA 140  K 3.9  CL 107  CO2 23  GLUCOSE 104*  BUN 23  CREATININE 1.14*  CALCIUM 8.4*    Intake/Output Summary (Last 24 hours) at 05/09/2020 1026 Last data filed at 05/08/2020 1847 Gross per 24 hour  Intake 360 ml  Output --  Net 360 ml        Physical Exam: BP (!) 160/60   Pulse 74   Temp 98 F (36.7 C)   Resp 19   Ht 5\' 4"  (1.626 m)   Wt 84.7 kg   SpO2 92%   BMI 32.05 kg/m   Constitutional: No distress . Vital signs reviewed. HENT: Normocephalic.  Atraumatic. Eyes: EOMI. No discharge. Cardiovascular: No JVD.  RRR. Respiratory: Normal effort.  No stridor.  Bilateral clear to auscultation. GI: Non-distended.  BS +. Skin: Warm and dry.  Intact. Psych: Normal mood.  Normal behavior. Musc: Left hip with edema and tenderness, unchanged Neuro: Alert Motor: B/l UE: 4/5 proximal distal Right lower extremity: Hip flexion, knee extension 2/5, ankle dorsiflexion 4/5 (some pain inhibition, also limited to positioning), unchanged Left extremity: Hip flexion, knee  extension 1+/5, ankle dorsiflexion 3/5 with pain inhibition, unchanged (some limitation due to positioning and pain) Very HOH Right facial weakness, unchanged  Assessment/Plan: 1. Functional deficits which require 3+ hours per day of interdisciplinary therapy in a comprehensive inpatient rehab setting.  Physiatrist is providing close team supervision and 24 hour management of active medical problems listed below.  Physiatrist and rehab team continue to assess barriers to discharge/monitor patient progress toward functional and medical goals   Care Tool:  Bathing    Body parts bathed by patient: Right arm,Left arm,Chest,Abdomen,Right upper leg,Left upper leg,Face,Front perineal area   Body parts bathed by helper: Buttocks,Right lower leg,Left lower leg     Bathing assist Assist Level: Moderate Assistance - Patient 50 - 74%     Upper Body Dressing/Undressing Upper body dressing   What is the patient wearing?: Pull over shirt    Upper body assist Assist Level: Set up assist    Lower Body Dressing/Undressing Lower body dressing      What is the patient wearing?: Pants,Incontinence brief     Lower body assist Assist for lower body dressing: Moderate Assistance - Patient 50 - 74%     Toileting Toileting    Toileting assist Assist for toileting: Moderate Assistance - Patient 50 - 74%     Transfers Chair/bed transfer  Transfers assist     Chair/bed transfer assist  level: Minimal Assistance - Patient > 75%     Locomotion Ambulation   Ambulation assist   Ambulation activity did not occur: Safety/medical concerns  Assist level: Minimal Assistance - Patient > 75% Assistive device: Parallel bars Max distance: 10   Walk 10 feet activity   Assist  Walk 10 feet activity did not occur: Safety/medical concerns  Assist level: Minimal Assistance - Patient > 75% Assistive device: Parallel bars   Walk 50 feet activity   Assist Walk 50 feet with 2 turns  activity did not occur: Safety/medical concerns         Walk 150 feet activity   Assist Walk 150 feet activity did not occur: Safety/medical concerns         Walk 10 feet on uneven surface  activity   Assist Walk 10 feet on uneven surfaces activity did not occur: Safety/medical concerns         Wheelchair     Assist Will patient use wheelchair at discharge?: Yes Type of Wheelchair: Manual    Wheelchair assist level: Minimal Assistance - Patient > 75% Max wheelchair distance: 50    Wheelchair 50 feet with 2 turns activity    Assist        Assist Level: Minimal Assistance - Patient > 75%   Wheelchair 150 feet activity     Assist      Assist Level: Total Assistance - Patient < 25%    Medical Problem List and Plan: 1.  Decreased functional ability secondary to left intertrochanteric femur fracture as well as history of left femur fracture May 2019 status post IM nailing as well as removal of hardware left femur from previous fracture 04/23/2020.  Weightbearing as tolerated.  Hospital course complicated by postoperative small acute infarct right cerebellum, bilateral occipital parietal lobes in the high left precentral gyrus.  Continue CIR 2.  Antithrombotics: -DVT/anticoagulation: Chronic Coumadin  INR supratherapeutic on 2/11             -antiplatelet therapy: On Coumadin 3. Pain Management: Oxycodone as needed, Robaxin as needed  See #17  Relatively controlled with meds on 2/11  Discussed positioning in bed. Needs to utilize ice also 4. Mood: Provide emotional support             -antipsychotic agents: N/A 5. Neuropsych: This patient is ?fully capable of making decisions on her own behalf. 6.  Acute blood loss anemia.    Hemoglobin 8.0 on 2/11, labs ordered for Monday 7.  Atrial fibrillation.  Toprol-XL 25 mg daily.  Continue Coumadin.  Cardiac rate controlled  HR controlled on 2/11  Monitor with increased activity 8. Hypertension:  Chlorthalidone losartan currently on hold.  Continue Toprol  Losartan 25, increased to 50 on 2/9  Labile on 2/11, monitor trend 9.  CKD stage III.  Baseline creatinine ?1.46.    Creatinine 1.14 on 2/9, labs ordered for Monday  Continue to monitor 10.  Hyperlipidemia: Lipitor 11. Recurrent UTIs.  Latest urinalysis study negative.    Complaining of dysuria on 2/11, UA/culture ordered 12. Skin/Wound Care: Routine skin checks 13. Fluids/Electrolytes/Nutrition: Routine in and outs 14. Very hard of hearing- talk to her LEFT ear.  15.  Chronic diarrhea: used Imodium 2 mg BID daily at home  16. Leg length discrepancy- L shorter than Right- husband to bring in appropriate lift shoes.  17. L shoulder bicipital tendinitis based on clinical exam  Voltaren gel 2G QID 18.  Congestion  Flonase ordered  Resolved 19. GERD  Timing  adjusted per patient/husband-husband would like medication at 715-discussed limitations in giving medication at an exact time on a consistent basis  Improved  LOS: 10 days A FACE TO FACE EVALUATION WAS PERFORMED  Isam Unrein Lorie Phenix 05/09/2020, 10:26 AM

## 2020-05-09 NOTE — Progress Notes (Signed)
Physical Therapy Session Note  Patient Details  Name: Aron Needles MRN: [id number] Date of Birth: 16-Jan-1934  Today's Date: 05/09/2020 PT Individual Time: 1100-1155 PT Individual Time Calculation (min): 55 min   Short Term Goals: Week 2:  PT Short Term Goal 1 (Week 2): Pt will consistently perform bed mobility with modA PT Short Term Goal 2 (Week 2): Pt will consistently perform sit<>stand transfers with modA and LRAD PT Short Term Goal 3 (Week 2): Pt will perform bed<>chair transfers wtih modA PT Short Term Goal 4 (Week 2): Pt will ambulate 64ft with modA and LRAD  Skilled Therapeutic Interventions/Progress Updates:     Pt received sitting upright in w/c, agreeable to therapy. Reports mild L leg pain, rest breaks, mobility, and repositioning provided during session for pain management. Pt requesting to perform seated there-ex as a "warm up." She completed LAQ and hip marches, AROM, in available range of motion, 3x10 reps. W/c transport to main therapy gym. Required modA for sit<>stand to RW with assist for power to rise and forward weight shift to initiate. Gait training 2x91ft with minA and RW, demo's heavy reliance of BUE through RW, step-to gait pattern, and decreased LLE weight, forward flexed trunk and forward head posture. It took her 40 steps to reach 2ft. Performed standing toe taps 2x10 with LLE on 2inch platform with RW support and CGA for balance, focusing on L knee/hip flexion and self assisted AROM. Pt returned to her room in her w/c and she remained seated in w/c with needs in reach.  Therapy Documentation Precautions:  Precautions Precautions: Fall Restrictions Weight Bearing Restrictions: Yes LUE Weight Bearing: Weight bearing as tolerated LLE Weight Bearing: Weight bearing as tolerated General:   Therapy/Group: Individual Therapy  Cane Dubray P Billye Nydam PT 05/09/2020, 7:38 AM

## 2020-05-09 NOTE — Progress Notes (Signed)
Occupational Therapy Session Note  Patient Details  Name: [name] MRN: [id number] Date of Birth: 1933-07-12  Today's Date: 05/09/2020 OT Individual Time: 2248-2500 OT Individual Time Calculation (min): 45 min    Short Term Goals: Week 2:  OT Short Term Goal 1 (Week 2): Pt will complete toilet transfer with a stand pivot with mod A or less on a consistent basis. OT Short Term Goal 2 (Week 2): Pt will be able to ambulate 8 feet to be able to access her bathroom. OT Short Term Goal 3 (Week 2): Pt will be able to stand and pull pants down to prepare for toileting. OT Short Term Goal 4 (Week 2): Pt will be able to pull pants up with mod A.  Skilled Therapeutic Interventions/Progress Updates:    Pt sitting up in w/c, reporting she might need to use bathroom but she isnt sure until she stands up.  No verbal c/o pain, however pt did wince intermittently during functional mobility and rubbing left thigh indicating pain.  Pt allowed for rest breaks and repositioning as needed to address pain during session.  Pt completed stand pivot w/c to 3 in 1 commode using RW with CGA and increased time.  Pt had continent episode of urine.  Clothing mgt completed with mod assist and pericare with CGA in standing.  Pt completed blocked practice sit<>stand at w/c using RW with CGA and VCs intermittently to facilitate forward weight shifting of trunk and upward head posture once in standing.  Pt stood at sink to wash hands with CGA and encouragement to reach outside BOS to retrieve paper towels. Pt completed 2 x 10 reps BLE seated AROM including knee extensions, hip flexion, glute squeeze, hip adduction, ankle dorsi/plantar flexion.  Pt reporting fatigue thereafter requesting to discontinue session and rest. Call bell in reach, nurse notified of pt's location.  Therapy Documentation Precautions:  Precautions Precautions: Fall Restrictions Weight Bearing Restrictions: Yes LUE Weight Bearing: Weight bearing as  tolerated LLE Weight Bearing: Weight bearing as tolerated   Therapy/Group: Individual Therapy  Ezekiel Slocumb 05/09/2020, 4:14 PM

## 2020-05-09 NOTE — Progress Notes (Signed)
[facility] for Warfarin Indication: atrial fibrillation  Allergies  Allergen Reactions  . Pradaxa [Dabigatran Etexilate Mesylate] Other (See Comments)    INTERNAL BLEEDING  . Clindamycin/Lincomycin Other (See Comments)    PT STATES HER DOCTOR TOLD HER NOT TO TAKE CLINDAMYCIN BECAUSE SHE GOT C-DIFF AFTER TAKING AMPICILLIN- "tolerated in 2022" (per spouse)  . Latex Other (See Comments)    Blisters   . Ampicillin Other (See Comments)    C-DIF AFTER TAKING AMPICILLIN- "tolerated in 2022" (per husband) Has patient had a PCN reaction causing immediate rash, facial/tongue/throat swelling, SOB or lightheadedness with hypotension: No Has patient had a PCN reaction causing severe rash involving mucus membranes or skin necrosis: No Has patient had a PCN reaction that required hospitalization: No Has patient had a PCN reaction occurring within the last 10 years: No If all of the above answers are "NO", then may proceed with Cephalosporin u  . Sulfamethoxazole Diarrhea    Patient Measurements: Height: 5\' 4"  (162.6 cm) Weight: 84.7 kg (186 lb 11.7 oz) IBW/kg (Calculated) : 54.7  Vital Signs: Temp: 98 F (36.7 C) (02/11 0411) BP: 160/60 (02/11 0411) Pulse Rate: 74 (02/11 0411)  Labs: Recent Labs    05/07/20 0443 05/09/20 0456  HGB 8.1* 8.0*  HCT 25.9* 26.5*  PLT 359 342  LABPROT 30.2* 35.1*  INR 3.0* 3.6*  CREATININE 1.14*  --     Estimated Creatinine Clearance: 38.7 mL/min (A) (by C-G formula based on SCr of 1.14 mg/dL (H)).   Assessment: 87 year old with PMH significant for PAF on warfarin, recurrent UTI, HTN, HLD admitted to [facility] 04/22/2020 with acute closed left intratrochanteric hip fracture.Patient developed worsening dysphagia, MRI was obtain which showed acute stroke. Warfarin held per neurology recommendation. Pharmacy consulted to restart Warfarin on 1/31.  INR up to 3.6 today. Hgb remains low around 8s. We will hold coumadin  today and check INR again tomorrow.   Goal of Therapy:  INR 2-3  Plan:  No coumadin today Daily INR  Onnie Boer, PharmD, BCIDP, AAHIVP, CPP Infectious Disease Pharmacist 05/09/2020 8:17 AM

## 2020-05-10 LAB — PROTIME-INR
INR: 3.4 — ABNORMAL HIGH (ref 0.8–1.2)
Prothrombin Time: 33.2 seconds — ABNORMAL HIGH (ref 11.4–15.2)

## 2020-05-10 MED ORDER — CEPHALEXIN 250 MG PO CAPS
250.0000 mg | ORAL_CAPSULE | Freq: Three times a day (TID) | ORAL | Status: AC
  Administered 2020-05-10 – 2020-05-17 (×21): 250 mg via ORAL
  Filled 2020-05-10 (×21): qty 1

## 2020-05-10 MED ORDER — OXYCODONE-ACETAMINOPHEN 5-325 MG PO TABS
1.0000 | ORAL_TABLET | Freq: Four times a day (QID) | ORAL | Status: DC | PRN
  Administered 2020-05-10 – 2020-05-21 (×27): 1 via ORAL
  Filled 2020-05-10 (×28): qty 1

## 2020-05-10 MED ORDER — OXYCODONE HCL 5 MG PO TABS
5.0000 mg | ORAL_TABLET | Freq: Every day | ORAL | Status: DC
  Administered 2020-05-11 – 2020-05-22 (×12): 5 mg via ORAL
  Filled 2020-05-10 (×12): qty 1

## 2020-05-10 NOTE — Progress Notes (Signed)
This morning Found tums in pts room that pts husband brought from home, and was informed once again he could not bring medications of any kind from home with out authorization. He expressed understanding.    This afternoon when taking pts 1400 dose of keflex pts husband informed student nurse that "he brought her an antibiotic from home and gave her a 2pm dose". I again explained to him he could not bring any medication from home, this was discussed and explained several times from a prior incident earlier in the week. He stated "I know i'm a bad boy but she is hurting from a UTI what am I supposed to do" I explained to him that we had a dose for her, and that I would have to inform my charge nurse. Charge nurse was made aware, and spoke with husband. Pharmacy was called to decide what needed to be done, was told to document given but comment "give from home med supply by husband" will continue next dose as scheduled"    Dayna Ramus, LPN

## 2020-05-10 NOTE — Progress Notes (Signed)
Occupational Therapy Session Note  Patient Details  Name: [name] MRN: [id number] Date of Birth: [birth year]  Today's Date: 05/10/2020 OT Individual Time: 8280-0349 OT Individual Time Calculation (min): 65 min    Short Term Goals: Week 1:  OT Short Term Goal 1 (Week 1): Pt will be able to rise to stand with RW with max A of 1 to prepare for toileting. OT Short Term Goal 1 - Progress (Week 1): Met OT Short Term Goal 2 (Week 1): Pt will be able to stand with RW and release 1 hand for clothing management with max A. OT Short Term Goal 2 - Progress (Week 1): Met OT Short Term Goal 3 (Week 1): Pt will be able to stand pivot with RW with max A of 1. OT Short Term Goal 3 - Progress (Week 1): Met OT Short Term Goal 4 (Week 1): Pt will demonstrate improved processing by donning shirt with min cues. OT Short Term Goal 4 - Progress (Week 1): Met  Skilled Therapeutic Interventions/Progress Updates:    1;1. Pt received in bed begrudginly agreeable to OT. Pt requesting pain medicaiton at beginning of session. LPN alerted face to face and brought in middle of session. Pt also with complaints about time of session and made note in scheduling chart to not begin tx prior to am. Breakfast tray arrived and pt agreeable to sit up and eat. Pt requires MAX A to manage LEs and elevate trunk to EOB. Pt sits EOB with S to set up tray with min A to manage lids on containers. Pt husband arrives and pt reporting need to use bathroom. Stedy utilized for time management for transferring pt into bathroom with MOD A to power up into standing and total A for CM. Pt able to cleanse self. MOD A for donning shirt. Total A for pants seated on commode. Exited session with pt seated in bed, exit alarm on and call light in reach   OF NOTE WHILE PT ON TOILET, PT CALLS FOR HUSBAND AND HUSBAND THEN GETS INTO TOP DRAWER AND OT CAN HEAR PILL BOTTLE RUSTLING BUT UNABLE TO SEE PILLS. HUSBAND GRABS APPLESAUCE. WHEN ASKED HUSBAND STATES.  "IM JUST BRINGING IN APPLESAUCE TO WET HER WHISTLE." Alterted LPN who states husband had previously been caught doing the same behavior another time.   Therapy Documentation Precautions:  Precautions Precautions: Fall Restrictions Weight Bearing Restrictions: Yes LUE Weight Bearing: Weight bearing as tolerated LLE Weight Bearing: Weight bearing as tolerated General:   Vital Signs: Therapy Vitals Temp: 98.2 F (36.8 C) Temp Source: Oral Pulse Rate: 70 Resp: 18 BP: (!) 167/68 Patient Position (if appropriate): Lying Oxygen Therapy SpO2: 97 % O2 Device: Room Air Pain:   ADL: ADL Eating: Set up Grooming: Setup Upper Body Bathing: Supervision/safety Where Assessed-Upper Body Bathing: Shower Lower Body Bathing: Moderate assistance Where Assessed-Lower Body Bathing: Shower Upper Body Dressing: Supervision/safety Where Assessed-Upper Body Dressing: Wheelchair Lower Body Dressing: Maximal assistance Where Assessed-Lower Body Dressing: Wheelchair Toileting: Maximal assistance Where Assessed-Toileting: Glass blower/designer: Maximal Print production planner Method: Arts development officer: Energy manager Method:  (used stedy) Youth worker: Nurse, learning disability    Praxis   Exercises:   Other Treatments:     Therapy/Group: Individual Therapy  Tonny Branch 05/10/2020, 7:02 AM

## 2020-05-10 NOTE — Progress Notes (Signed)
[place] PHYSICAL MEDICINE & REHABILITATION PROGRESS NOTE  Subjective/Complaints: Husband asked if ucx was back yet. Upset that there tend to be delays in getting morning oxycodone.   ROS: Patient denies fever, rash, sore throat, blurred vision, nausea, vomiting, diarrhea, cough, shortness of breath or chest pain,   headache, or mood change.    Objective: Vital Signs: Blood pressure (!) 167/68, pulse 70, temperature 98.2 F (36.8 C), temperature source Oral, resp. rate 18, height 5\' 4"  (1.626 m), weight 82.7 kg, SpO2 97 %. No results found. Recent Labs    05/09/20 0456  WBC 6.8  HGB 8.0*  HCT 26.5*  PLT 342   No results for input(s): NA, K, CL, CO2, GLUCOSE, BUN, CREATININE, CALCIUM in the last 72 hours.  Intake/Output Summary (Last 24 hours) at 05/10/2020 1311 Last data filed at 05/09/2020 1725 Gross per 24 hour  Intake 177 ml  Output --  Net 177 ml        Physical Exam: BP (!) 167/68 (BP Location: Right Arm)   Pulse 70   Temp 98.2 F (36.8 C) (Oral)   Resp 18   Ht 5\' 4"  (1.626 m)   Wt 82.7 kg   SpO2 97%   BMI 31.30 kg/m   Constitutional: No distress . Vital signs reviewed. HEENT: EOMI, oral membranes moist Neck: supple Cardiovascular: RRR without murmur. No JVD    Respiratory/Chest: CTA Bilaterally without wheezes or rales. Normal effort    GI/Abdomen: BS +, non-tender, non-distended Ext: no clubbing, cyanosis, or edema Psych: pleasant and cooperative. Musc: Left hip with edema and tenderness--stable Neuro: Alert Motor: B/l UE: 4/5 proximal distal Right lower extremity: Hip flexion, knee extension 2/5, ankle dorsiflexion 4/5 (pain inhibition), unchanged Left extremity: Hip flexion, knee extension 1+/5, ankle dorsiflexion 3/5 with pain inhibition, unchanged (some limitation due to positioning and pain) HOH. Right facial weakness, unchanged  Assessment/Plan: 1. Functional deficits which require 3+ hours per day of interdisciplinary therapy in a  comprehensive inpatient rehab setting.  Physiatrist is providing close team supervision and 24 hour management of active medical problems listed below.  Physiatrist and rehab team continue to assess barriers to discharge/monitor patient progress toward functional and medical goals   Care Tool:  Bathing    Body parts bathed by patient: Right arm,Left arm,Chest,Abdomen,Right upper leg,Left upper leg,Face,Front perineal area   Body parts bathed by helper: Buttocks,Right lower leg,Left lower leg     Bathing assist Assist Level: Moderate Assistance - Patient 50 - 74%     Upper Body Dressing/Undressing Upper body dressing   What is the patient wearing?: Pull over shirt    Upper body assist Assist Level: Set up assist    Lower Body Dressing/Undressing Lower body dressing      What is the patient wearing?: Pants,Incontinence brief     Lower body assist Assist for lower body dressing: Moderate Assistance - Patient 50 - 74%     Toileting Toileting    Toileting assist Assist for toileting: Minimal Assistance - Patient > 75%     Transfers Chair/bed transfer  Transfers assist     Chair/bed transfer assist level: Moderate Assistance - Patient 50 - 74%     Locomotion Ambulation   Ambulation assist   Ambulation activity did not occur: Safety/medical concerns  Assist level: Minimal Assistance - Patient > 75% Assistive device: Walker-rolling Max distance: 70ft   Walk 10 feet activity   Assist  Walk 10 feet activity did not occur: Safety/medical concerns  Assist level: Minimal Assistance -  Patient > 75% Assistive device: Walker-rolling   Walk 50 feet activity   Assist Walk 50 feet with 2 turns activity did not occur: Safety/medical concerns         Walk 150 feet activity   Assist Walk 150 feet activity did not occur: Safety/medical concerns         Walk 10 feet on uneven surface  activity   Assist Walk 10 feet on uneven surfaces activity did  not occur: Safety/medical concerns         Wheelchair     Assist Will patient use wheelchair at discharge?: Yes Type of Wheelchair: Manual    Wheelchair assist level: Minimal Assistance - Patient > 75% Max wheelchair distance: 50    Wheelchair 50 feet with 2 turns activity    Assist        Assist Level: Minimal Assistance - Patient > 75%   Wheelchair 150 feet activity     Assist      Assist Level: Total Assistance - Patient < 25%   BP (!) 167/68 (BP Location: Right Arm)   Pulse 70   Temp 98.2 F (36.8 C) (Oral)   Resp 18   Ht 5\' 4"  (1.626 m)   Wt 82.7 kg   SpO2 97%   BMI 31.30 kg/m   Medical Problem List and Plan: 1.  Decreased functional ability secondary to left intertrochanteric femur fracture as well as history of left femur fracture May 2019 status post IM nailing as well as removal of hardware left femur from previous fracture 04/23/2020.  Weightbearing as tolerated.  Hospital course complicated by postoperative small acute infarct right cerebellum, bilateral occipital parietal lobes in the high left precentral gyrus.  Continue CIR 2.  Antithrombotics: -DVT/anticoagulation: Chronic Coumadin  INR 3.4 2/12--pharmacy following--appreciate help             -antiplatelet therapy: n/a 3. Pain Management: Oxycodone as needed, Robaxin as needed  See #17  Relatively controlled with meds on 2/12  Will schedule AM oxycodone at 0730 after discussing with pt/husband 4. Mood: Provide emotional support             -antipsychotic agents: N/A 5. Neuropsych: This patient is ?fully capable of making decisions on her own behalf. 6.  Acute blood loss anemia.    Hemoglobin 8.0 on 2/11, labs ordered for Monday 7.  Atrial fibrillation.  Toprol-XL 25 mg daily.  Continue Coumadin.  Cardiac rate controlled  HR controlled on 2/12  Monitor with increased activity 8. Hypertension: Chlorthalidone losartan currently on hold.  Continue Toprol  Losartan 25, increased to 50  on 2/9  2/12 remains borderline/elevated at times   -increase toprol to 50mg  daily 9.  CKD stage III.  Baseline creatinine ?1.46.    Creatinine 1.14 on 2/9, labs ordered for Monday  Continue to monitor 10.  Hyperlipidemia: Lipitor 11. Recurrent UTIs.  Latest urinalysis study negative.    Complaining of dysuria on 2/11  2/12 ucs with 100k GNR--tolerates keflex-->begin empirically 12. Skin/Wound Care: Routine skin checks 13. Fluids/Electrolytes/Nutrition: Routine in and outs 14. Very hard of hearing- talk to her LEFT ear.  15.  Chronic diarrhea: used Imodium 2 mg BID daily at home  16. Leg length discrepancy- L shorter than Right- husband to bring in appropriate lift shoes.  17. L shoulder bicipital tendinitis based on clinical exam  Voltaren gel 2G QID 18.  Congestion  Flonase ordered  Resolved 19. GERD  Timing adjusted per patient/husband-husband would like medication at 715-discussed limitations  in giving medication at an exact time on a consistent basis  Improved  LOS: 11 days A FACE TO FACE EVALUATION WAS PERFORMED  Meredith Staggers 05/10/2020, 1:11 PM

## 2020-05-10 NOTE — Progress Notes (Signed)
ANTICOAGULATION CONSULT NOTE - Follow Up Consult  Pharmacy Consult for Warfarin Indication: atrial fibrillation  Allergies  Allergen Reactions  . Pradaxa [Dabigatran Etexilate Mesylate] Other (See Comments)    INTERNAL BLEEDING  . Clindamycin/Lincomycin Other (See Comments)    PT STATES HER DOCTOR TOLD HER NOT TO TAKE CLINDAMYCIN BECAUSE SHE GOT C-DIFF AFTER TAKING AMPICILLIN- "tolerated in 2022" (per spouse)  . Latex Other (See Comments)    Blisters   . Ampicillin Other (See Comments)    C-DIF AFTER TAKING AMPICILLIN- "tolerated in 2022" (per husband) Has patient had a PCN reaction causing immediate rash, facial/tongue/throat swelling, SOB or lightheadedness with hypotension: No Has patient had a PCN reaction causing severe rash involving mucus membranes or skin necrosis: No Has patient had a PCN reaction that required hospitalization: No Has patient had a PCN reaction occurring within the last 10 years: No If all of the above answers are "NO", then may proceed with Cephalosporin u  . Sulfamethoxazole Diarrhea    Patient Measurements: Height: 5\' 4"  (162.6 cm) Weight: 82.7 kg (182 lb 5.1 oz) IBW/kg (Calculated) : 54.7  Vital Signs: Temp: 98.2 F (36.8 C) (02/12 0440) Temp Source: Oral (02/12 0440) BP: 167/68 (02/12 0440) Pulse Rate: 70 (02/12 0440)  Labs: Recent Labs    05/09/20 0456 05/10/20 0539  HGB 8.0*  --   HCT 26.5*  --   PLT 342  --   LABPROT 35.1* 33.2*  INR 3.6* 3.4*    Estimated Creatinine Clearance: 38.2 mL/min (A) (by C-G formula based on SCr of 1.14 mg/dL (H)).  Assessment: 87 year old with PMH significant for PAF on warfarin, recurrent UTI, HTN, HLD admitted to [facility] 04/22/2020 with acute closed left intratrochanteric hip fracture.Patient developed worsening dysphagia, MRI was obtain which showed acute stroke. Warfarin held per neurology recommendation. Pharmacy consulted to restart Warfarin on 1/31.  INR supratherapeutic at 3.4 today. Hgb  remains low at 8, plts 342.   Goal of Therapy:  INR 2-3 Monitor platelets by anticoagulation protocol: Yes   Plan:  HOLD warfarin today Daily PT/INR and weekly CBC Monitor for signs and symptoms of bleeding  Shauna Hugh, PharmD, [place]  PGY-1 Pharmacy Resident 05/10/2020 9:32 AM  Please check [email or web address] for unit-specific pharmacy phone numbers.

## 2020-05-11 DIAGNOSIS — A499 Bacterial infection, unspecified: Secondary | ICD-10-CM

## 2020-05-11 LAB — URINE CULTURE: Culture: >=100000 — AB

## 2020-05-11 LAB — PROTIME-INR
INR: 2.8 — ABNORMAL HIGH (ref 0.8–1.2)
Prothrombin Time: 28.4 seconds — ABNORMAL HIGH (ref 11.4–15.2)

## 2020-05-11 MED ORDER — METOPROLOL SUCCINATE ER 25 MG PO TB24
12.5000 mg | ORAL_TABLET | Freq: Once | ORAL | Status: AC
  Administered 2020-05-11: 12.5 mg via ORAL
  Filled 2020-05-11: qty 1

## 2020-05-11 MED ORDER — WARFARIN SODIUM 2.5 MG PO TABS
2.5000 mg | ORAL_TABLET | Freq: Once | ORAL | Status: AC
  Administered 2020-05-11: 2.5 mg via ORAL
  Filled 2020-05-11: qty 1

## 2020-05-11 MED ORDER — METOPROLOL SUCCINATE ER 25 MG PO TB24
37.5000 mg | ORAL_TABLET | Freq: Every day | ORAL | Status: DC
  Administered 2020-05-12 – 2020-05-22 (×11): 37.5 mg via ORAL
  Filled 2020-05-11 (×11): qty 2

## 2020-05-11 NOTE — Progress Notes (Addendum)
ANTICOAGULATION CONSULT NOTE - Follow Up Consult  Pharmacy Consult for Warfarin Indication: atrial fibrillation  Allergies  Allergen Reactions  . Pradaxa [Dabigatran Etexilate Mesylate] Other (See Comments)    INTERNAL BLEEDING  . Clindamycin/Lincomycin Other (See Comments)    PT STATES HER DOCTOR TOLD HER NOT TO TAKE CLINDAMYCIN BECAUSE SHE GOT C-DIFF AFTER TAKING AMPICILLIN- "tolerated in 2022" (per spouse)  . Latex Other (See Comments)    Blisters   . Ampicillin Other (See Comments)    C. Diff after taking ampicillin - "tolerated in 2022" (per husband) Pt has received cephalosporins in 2013, 2014, 2017, 2019, 2022   . Sulfamethoxazole Diarrhea    Patient Measurements: Height: 5\' 4"  (162.6 cm) Weight: 84.7 kg (186 lb 11.7 oz) IBW/kg (Calculated) : 54.7  Vital Signs: Temp: 97.8 F (36.6 C) (02/13 0533) Temp Source: Oral (02/13 0533) BP: 156/67 (02/13 0533) Pulse Rate: 69 (02/13 0533)  Labs: Recent Labs    05/09/20 0456 05/10/20 0539 05/11/20 0506  HGB 8.0*  --   --   HCT 26.5*  --   --   PLT 342  --   --   LABPROT 35.1* 33.2* 28.4*  INR 3.6* 3.4* 2.8*    Estimated Creatinine Clearance: 38.7 mL/min (A) (by C-G formula based on SCr of 1.14 mg/dL (H)).  Assessment: 87 year old with PMH significant for PAF on warfarin, recurrent UTI, HTN, HLD admitted to [facility] 04/22/2020 with acute closed left intratrochanteric hip fracture.Patient developed worsening dysphagia, MRI was obtain which showed acute stroke. Warfarin held per neurology recommendation. Pharmacy consulted to restart Warfarin on 1/31.  INR was 2.3 upon admit. PTA warfarin dose is 5 mg daily EXCEPT for 7.5 mg on Tues/Thurs.  INR is therapeutic at 2.8 today, last warfarin dose of 7.5 mg on 2/10. The patient's INR dropped fairly significantly from 3.4 to 2.8 overnight, therefore will give a small dose of warfarin tonight to prevent patient from dropping subtherapeutic. The patient also started cephalexin,  will monitor INR closely due to interaction. Last Hgb low at 8, platelets 342, and next CBC on 2/14.  Goal of Therapy:  INR 2-3 Monitor platelets by anticoagulation protocol: Yes   Plan:  Give warfarin 2.5 mg PO tonight Daily PT/INR and weekly CBC qMon Monitor for signs and symptoms of bleeding  Shauna Hugh, PharmD, [place]  PGY-1 Pharmacy Resident 05/11/2020 8:30 AM  Please check [email or web address] for unit-specific pharmacy phone numbers.

## 2020-05-11 NOTE — Progress Notes (Signed)
[place] PHYSICAL MEDICINE & REHABILITATION PROGRESS NOTE  Subjective/Complaints: Pt says she had a long night. Didn't get along with nurse apparently. Feeling ok this morning.   ROS: Patient denies fever, rash, sore throat, blurred vision, nausea, vomiting, diarrhea, cough, shortness of breath or chest pain,  headache, or mood change.   Objective: Vital Signs: Blood pressure (!) 156/67, pulse 69, temperature 97.8 F (36.6 C), temperature source Oral, resp. rate 18, height 5\' 4"  (1.626 m), weight 84.7 kg, SpO2 97 %. No results found. Recent Labs    05/09/20 0456  WBC 6.8  HGB 8.0*  HCT 26.5*  PLT 342   No results for input(s): NA, K, CL, CO2, GLUCOSE, BUN, CREATININE, CALCIUM in the last 72 hours.  Intake/Output Summary (Last 24 hours) at 05/11/2020 1104 Last data filed at 05/10/2020 1320 Gross per 24 hour  Intake 177 ml  Output --  Net 177 ml        Physical Exam: BP (!) 156/67 (BP Location: Right Arm)   Pulse 69   Temp 97.8 F (36.6 C) (Oral)   Resp 18   Ht 5\' 4"  (1.626 m)   Wt 84.7 kg   SpO2 97%   BMI 32.05 kg/m   Constitutional: No distress . Vital signs reviewed. HEENT: EOMI, oral membranes moist Neck: supple Cardiovascular: RRR without murmur. No JVD    Respiratory/Chest: CTA Bilaterally without wheezes or rales. Normal effort    GI/Abdomen: BS +, non-tender, non-distended Ext: no clubbing, cyanosis, or edema Psych: generally pleasant Musc: Left hip with edema and tenderness--improving, kyphotic posture. Neuro: Alert Motor: B/l UE: 4/5 proximal distal Right lower extremity: Hip flexion, knee extension 2/5, ankle dorsiflexion 4/5 (pain inhibition), stable appearance Left extremity: Hip flexion, knee extension 1+/5, ankle dorsiflexion 3/5 with pain inhibition, stable HOH. Right facial weakness, unchanged  Assessment/Plan: 1. Functional deficits which require 3+ hours per day of interdisciplinary therapy in a comprehensive inpatient rehab  setting.  Physiatrist is providing close team supervision and 24 hour management of active medical problems listed below.  Physiatrist and rehab team continue to assess barriers to discharge/monitor patient progress toward functional and medical goals   Care Tool:  Bathing    Body parts bathed by patient: Right arm,Left arm,Chest,Abdomen,Right upper leg,Left upper leg,Face,Front perineal area   Body parts bathed by helper: Buttocks,Right lower leg,Left lower leg     Bathing assist Assist Level: Moderate Assistance - Patient 50 - 74%     Upper Body Dressing/Undressing Upper body dressing   What is the patient wearing?: Pull over shirt    Upper body assist Assist Level: Set up assist    Lower Body Dressing/Undressing Lower body dressing      What is the patient wearing?: Pants,Incontinence brief     Lower body assist Assist for lower body dressing: Moderate Assistance - Patient 50 - 74%     Toileting Toileting    Toileting assist Assist for toileting: Minimal Assistance - Patient > 75%     Transfers Chair/bed transfer  Transfers assist     Chair/bed transfer assist level: Moderate Assistance - Patient 50 - 74%     Locomotion Ambulation   Ambulation assist   Ambulation activity did not occur: Safety/medical concerns  Assist level: Minimal Assistance - Patient > 75% Assistive device: Walker-rolling Max distance: 32ft   Walk 10 feet activity   Assist  Walk 10 feet activity did not occur: Safety/medical concerns  Assist level: Minimal Assistance - Patient > 75% Assistive device: Walker-rolling  Walk 50 feet activity   Assist Walk 50 feet with 2 turns activity did not occur: Safety/medical concerns         Walk 150 feet activity   Assist Walk 150 feet activity did not occur: Safety/medical concerns         Walk 10 feet on uneven surface  activity   Assist Walk 10 feet on uneven surfaces activity did not occur: Safety/medical  concerns         Wheelchair     Assist Will patient use wheelchair at discharge?: Yes Type of Wheelchair: Manual    Wheelchair assist level: Minimal Assistance - Patient > 75% Max wheelchair distance: 50    Wheelchair 50 feet with 2 turns activity    Assist        Assist Level: Minimal Assistance - Patient > 75%   Wheelchair 150 feet activity     Assist      Assist Level: Total Assistance - Patient < 25%   BP (!) 156/67 (BP Location: Right Arm)   Pulse 69   Temp 97.8 F (36.6 C) (Oral)   Resp 18   Ht 5\' 4"  (1.626 m)   Wt 84.7 kg   SpO2 97%   BMI 32.05 kg/m   Medical Problem List and Plan: 1.  Decreased functional ability secondary to left intertrochanteric femur fracture as well as history of left femur fracture May 2019 status post IM nailing as well as removal of hardware left femur from previous fracture 04/23/2020.  Weightbearing as tolerated.  Hospital course complicated by postoperative small acute infarct right cerebellum, bilateral occipital parietal lobes in the high left precentral gyrus.  Continue CIR 2.  Antithrombotics: -DVT/anticoagulation: Chronic Coumadin  INR 2.8 2/13--appreciate pharmacy help             -antiplatelet therapy: n/a 3. Pain Management: Oxycodone as needed, Robaxin as needed  See #17  Relatively controlled with meds on 2/12  Scheduled AM oxycodone at 0730 per pt/husband request so that she receives it as she begins to move with therapy 4. Mood: Provide emotional support             -antipsychotic agents: N/A 5. Neuropsych: This patient is ?fully capable of making decisions on her own behalf. 6.  Acute blood loss anemia.    Hemoglobin 8.0 on 2/11, labs ordered for Monday 7.  Atrial fibrillation.  Toprol-XL 25 mg daily.  Continue Coumadin.  Cardiac rate controlled  HR controlled on 2/13  Monitor with increased activity 8. Hypertension: Chlorthalidone losartan currently on hold.  Continue Toprol  Losartan 25, increased  to 50 on 2/9  2/13 remains borderline/elevated at times   -increase toprol to 37.5mg  daily 9.  CKD stage III.  Baseline creatinine ?1.46.    Creatinine 1.14 on 2/9, labs ordered for Monday  Continue to monitor 10.  Hyperlipidemia: Lipitor 11. Recurrent UTIs.  Latest urinalysis study negative.    Complaining of dysuria on 2/11  2/13 UCX with 100K klebsiella--sens to keflex--continue for 7 days 12. Skin/Wound Care: Routine skin checks 13. Fluids/Electrolytes/Nutrition: Routine in and outs 14. Very hard of hearing- talk to her LEFT ear.  15.  Chronic diarrhea: used Imodium 2 mg BID daily at home  16. Leg length discrepancy- L shorter than Right- husband to bring in appropriate lift shoes.  17. L shoulder bicipital tendinitis based on clinical exam  Voltaren gel 2G QID 18.  Congestion  Flonase ordered  Resolved 19. GERD  Timing adjusted per patient/husband-husband would  like medication at 715-discussed limitations in giving medication at an exact time on a consistent basis  Improved  LOS: 12 days A FACE TO [place] 05/11/2020, 11:04 AM

## 2020-05-11 NOTE — Progress Notes (Signed)
Physical Therapy Session Note  Patient Details  Name: [name] MRN: [id number] Date of Birth: Jul 05, 1933  Today's Date: 05/11/2020 PT Individual Time: 0800-0900 PT Individual Time Calculation (min): 60 min   Short Term Goals: Week 2:  PT Short Term Goal 1 (Week 2): Pt will consistently perform bed mobility with modA PT Short Term Goal 2 (Week 2): Pt will consistently perform sit<>stand transfers with modA and LRAD PT Short Term Goal 3 (Week 2): Pt will perform bed<>chair transfers wtih modA PT Short Term Goal 4 (Week 2): Pt will ambulate 67ft with modA and LRAD  Skilled Therapeutic Interventions/Progress Updates:     Patient seated on the toilet with the Stedy in front of her and her husband in the room upon PT arrival. Patient alert and agreeable to PT session. Patient reported 6/10 L hip/leg pain during session, RN made aware. PT provided repositioning, rest breaks, and distraction as pain interventions throughout session.   Therapeutic Activity: Transfers: Patient performed sit to/from stand using Stedy x1 with min A and total A for peri-care and donning incontinence brief, pants, TED hose, and shoes. She performed sit to/from stand x4 with min A progressing to CGA with cues and increased time using RW. Provided verbal cues for forward weight shift, backing up to seat with both legs touching before sitting, and increased knee/hip/trunk extension in standing.  Gait Training:  Patient ambulated 12 feet out of the bathroom using RW with min A for AD management on decline threshold and CGA otherwise. Ambulated with significantly decreased gait speed, antalgic gait on L with decreased L weight shitf, increased B knee/hip/trunk flexion, and step-through gait pattern with decreased R step length. Provided verbal cues for erect posture, increased B knee extension in stance R>L, and increased step length as tolerated.  Timed Up and Go: Instructed patient to stand from her w/c, ambulate 10, turn  180 degrees around a cone and ambulate 10 ft back to her w/c, then sit down as quickly but as safety as possible.  Trial 1: 5:57 with min A to stand, CGA during ambulation, as above Trial 2: 5:11 with CGA throughout  Wheelchair Mobility:  Patient was transported in the w/c with total A throughout session for energy conservation and time management. Patient propelled the w/c ~55 ft using B upper extremities at slow speed. Provided cues for equal propulsion and increased stroke length for improved momentum and speed with activity.   Patient required seated rest breaks throughout session due to decreased activity tolerance and L lower extremity pain. Provided education on increasing activity tolerance and speed of mobility for safety and improved functional mobility. Also educated on increasing ROM of her L hip and knee with functional mobility for improved ROM with activity throughout the day. Patient receptive to all education.   Patient in w/c with her husband in the room at end of session with breaks locked, chair alarm set, and all needs within reach.    Therapy Documentation Precautions:  Precautions Precautions: Fall Restrictions Weight Bearing Restrictions: Yes LUE Weight Bearing: Weight bearing as tolerated LLE Weight Bearing: Weight bearing as tolerated   Therapy/Group: Individual Therapy  Myrl Bynum L Beatris Belen PT, DPT  05/11/2020, 12:10 PM

## 2020-05-12 LAB — CBC WITH DIFFERENTIAL/PLATELET
Abs Immature Granulocytes: 0.03 10*3/uL (ref 0.00–0.07)
Basophils Absolute: 0.1 10*3/uL (ref 0.0–0.1)
Basophils Relative: 1 %
Eosinophils Absolute: 0.2 10*3/uL (ref 0.0–0.5)
Eosinophils Relative: 4 %
HCT: 27.2 % — ABNORMAL LOW (ref 36.0–46.0)
Hemoglobin: 8.4 g/dL — ABNORMAL LOW (ref 12.0–15.0)
Immature Granulocytes: 1 %
Lymphocytes Relative: 21 %
Lymphs Abs: 1.1 10*3/uL (ref 0.7–4.0)
MCH: 28.3 pg (ref 26.0–34.0)
MCHC: 30.9 g/dL (ref 30.0–36.0)
MCV: 91.6 fL (ref 80.0–100.0)
Monocytes Absolute: 0.4 10*3/uL (ref 0.1–1.0)
Monocytes Relative: 8 %
Neutro Abs: 3.5 10*3/uL (ref 1.7–7.7)
Neutrophils Relative %: 65 %
Platelets: 335 10*3/uL (ref 150–400)
RBC: 2.97 MIL/uL — ABNORMAL LOW (ref 3.87–5.11)
RDW: 14.4 % (ref 11.5–15.5)
WBC: 5.3 10*3/uL (ref 4.0–10.5)
nRBC: 0 % (ref 0.0–0.2)

## 2020-05-12 LAB — BASIC METABOLIC PANEL
Anion gap: 10 (ref 5–15)
BUN: 24 mg/dL — ABNORMAL HIGH (ref 8–23)
CO2: 24 mmol/L (ref 22–32)
Calcium: 8.5 mg/dL — ABNORMAL LOW (ref 8.9–10.3)
Chloride: 105 mmol/L (ref 98–111)
Creatinine, Ser: 1.22 mg/dL — ABNORMAL HIGH (ref 0.44–1.00)
GFR, Estimated: 44 mL/min — ABNORMAL LOW (ref >60)
Glucose, Bld: 116 mg/dL — ABNORMAL HIGH (ref 70–99)
Potassium: 4.1 mmol/L (ref 3.5–5.1)
Sodium: 139 mmol/L (ref 135–145)

## 2020-05-12 LAB — PROTIME-INR
INR: 2.1 — ABNORMAL HIGH (ref 0.8–1.2)
Prothrombin Time: 22.7 seconds — ABNORMAL HIGH (ref 11.4–15.2)

## 2020-05-12 MED ORDER — WARFARIN SODIUM 5 MG PO TABS
5.0000 mg | ORAL_TABLET | Freq: Once | ORAL | Status: AC
  Administered 2020-05-12: 5 mg via ORAL
  Filled 2020-05-12: qty 1

## 2020-05-12 NOTE — Progress Notes (Signed)
[name] PHYSICAL MEDICINE & REHABILITATION PROGRESS NOTE  Subjective/Complaints: Patient seen sitting up in bed this AM.  She states she slept fairly well overnight.  She is more positive.  She asks me to get her ketchup. Discussed functional progress with therapies.   ROS: Denies CP, SOB, N/V/D  Objective: Vital Signs: Blood pressure 128/69, pulse 63, temperature 97.7 F (36.5 C), resp. rate 16, height 5\' 4"  (1.626 m), weight 87.3 kg, SpO2 98 %. No results found. Recent Labs    05/12/20 0513  WBC 5.3  HGB 8.4*  HCT 27.2*  PLT 335   Recent Labs    05/12/20 0513  NA 139  K 4.1  CL 105  CO2 24  GLUCOSE 116*  BUN 24*  CREATININE 1.22*  CALCIUM 8.5*    Intake/Output Summary (Last 24 hours) at 05/12/2020 1636 Last data filed at 05/12/2020 1300 Gross per 24 hour  Intake 942 ml  Output --  Net 942 ml        Physical Exam: BP 128/69 (BP Location: Right Arm)   Pulse 63   Temp 97.7 F (36.5 C)   Resp 16   Ht 5\' 4"  (1.626 m)   Wt 87.3 kg   SpO2 98%   BMI 33.04 kg/m   Constitutional: No distress . Vital signs reviewed. HENT: Normocephalic.  Atraumatic. Eyes: EOMI. No discharge. Cardiovascular: No JVD.  RRR. Respiratory: Normal effort.  No stridor.  Bilateral clear to auscultation. GI: Non-distended.  BS +. Skin: Warm and dry.  Intact. Psych: Normal mood.  Normal behavior. Musc: Left hip with edema and tenderness, improving Neuro: Alert Motor: B/l UE: 4+/5 proximal distal Right lower extremity: Hip flexion, knee extension 2/5, ankle dorsiflexion 4/5 (pain inhibition), stable appearance Left extremity: Hip flexion, knee extension 1+/5, ankle dorsiflexion 3/5 with pain inhibition, unchanged HOH Right facial weakness, unchanged  Assessment/Plan: 1. Functional deficits which require 3+ hours per day of interdisciplinary therapy in a comprehensive inpatient rehab setting.  Physiatrist is providing close team supervision and 24 hour management of active  medical problems listed below.  Physiatrist and rehab team continue to assess barriers to discharge/monitor patient progress toward functional and medical goals   Care Tool:  Bathing    Body parts bathed by patient: Right arm,Left arm,Chest,Abdomen,Right upper leg,Left upper leg,Face,Front perineal area   Body parts bathed by helper: Buttocks,Right lower leg,Left lower leg     Bathing assist Assist Level: Moderate Assistance - Patient 50 - 74%     Upper Body Dressing/Undressing Upper body dressing   What is the patient wearing?: Pull over shirt    Upper body assist Assist Level: Set up assist    Lower Body Dressing/Undressing Lower body dressing      What is the patient wearing?: Pants,Incontinence brief     Lower body assist Assist for lower body dressing: Moderate Assistance - Patient 50 - 74%     Toileting Toileting    Toileting assist Assist for toileting: Minimal Assistance - Patient > 75%     Transfers Chair/bed transfer  Transfers assist     Chair/bed transfer assist level: Minimal Assistance - Patient > 75%     Locomotion Ambulation   Ambulation assist   Ambulation activity did not occur: Safety/medical concerns  Assist level: Minimal Assistance - Patient > 75% Assistive device: Walker-rolling Max distance: 20 ft   Walk 10 feet activity   Assist  Walk 10 feet activity did not occur: Safety/medical concerns  Assist level: Minimal Assistance - Patient >  75% Assistive device: Walker-rolling   Walk 50 feet activity   Assist Walk 50 feet with 2 turns activity did not occur: Safety/medical concerns         Walk 150 feet activity   Assist Walk 150 feet activity did not occur: Safety/medical concerns         Walk 10 feet on uneven surface  activity   Assist Walk 10 feet on uneven surfaces activity did not occur: Safety/medical concerns         Wheelchair     Assist Will patient use wheelchair at discharge?:  Yes Type of Wheelchair: Manual    Wheelchair assist level: Supervision/Verbal cueing Max wheelchair distance: 55 ft    Wheelchair 50 feet with 2 turns activity    Assist        Assist Level: Supervision/Verbal cueing   Wheelchair 150 feet activity     Assist      Assist Level: Total Assistance - Patient < 25%   Medical Problem List and Plan: 1.  Decreased functional ability secondary to left intertrochanteric femur fracture as well as history of left femur fracture May 2019 status post IM nailing as well as removal of hardware left femur from previous fracture 04/23/2020.  Weightbearing as tolerated.  Hospital course complicated by postoperative small acute infarct right cerebellum, bilateral occipital parietal lobes in the high left precentral gyrus.  Continue CIR 2.  Antithrombotics: -DVT/anticoagulation: Chronic Coumadin  INR therapeutic on 2/14             -antiplatelet therapy: n/a 3. Pain Management: Oxycodone as needed, Robaxin as needed  See #17  Relatively controlled with meds on 2/14  Scheduled AM oxycodone at 0730 per pt/husband request so that she receives it as she begins to move with therapy 4. Mood: Provide emotional support             -antipsychotic agents: N/A 5. Neuropsych: This patient is ?fully capable of making decisions on her own behalf. 6.  Acute blood loss anemia.    Hemoglobin 8.4 on 2/14 7.  Atrial fibrillation.  Toprol-XL 25 mg daily.  Continue Coumadin.  Cardiac rate controlled  HR controlled on 2/14  Monitor with increased activity 8. Hypertension: Chlorthalidone on hold.    Losartan 25, increased to 50 on 2/9  Increased toprol to 37.5mg  daily  Labile on 2/14, monitor for trend 9.  CKD stage III.  Baseline creatinine ?1.46.    Creatinine 1.22 on 2/14  Continue to monitor 10.  Hyperlipidemia: Lipitor 11. Recurrent UTIs.  Latest urinalysis study negative.    Complaining of dysuria on 2/11  Continue keflex--continue for 7 days 12.  Skin/Wound Care: Routine skin checks 13. Fluids/Electrolytes/Nutrition: Routine in and outs 14. Very hard of hearing- talk to her LEFT ear.  15.  Chronic diarrhea: used Imodium 2 mg BID daily at home  16. Leg length discrepancy- L shorter than Right- husband to bring in appropriate lift shoes.  17. L shoulder bicipital tendinitis based on clinical exam  Voltaren gel 2G QID 18.  Congestion  Flonase ordered  Resolved 19. GERD  Timing adjusted per patient/husband-husband would like medication at 715-discussed limitations in giving medication at an exact time on a consistent basis  Improved  LOS: 13 days A FACE TO FACE EVALUATION WAS PERFORMED  Brenetta Penny Lorie Phenix 05/12/2020, 4:36 PM

## 2020-05-12 NOTE — Progress Notes (Signed)
Patient ID: [name], female   DOB: 03-31-1933, 87 y.o.   MRN: [id number] Met with pt and husband who is in the room to discuss plan for discharge. Husband feels he can assist her and plans to take pt home at discharge. Discussed him doing hands on care once closer to discharge date. He feels she is doing better, not sure if will be ambulating 80 ft when leaves. Husband reports he assisted her after femur fracture, but this is different and she requires more care. Will have him do hands on education once therapy team feels appropriate.

## 2020-05-12 NOTE — Progress Notes (Signed)
Occupational Therapy Session Note  Patient Details  Name: [name] MRN: [id number] Date of Birth: 22-Oct-1933  Today's Date: 05/12/2020 OT Individual Time: 1430-1454 OT Individual Time Calculation (min): 24 min    Short Term Goals: Week 2:  OT Short Term Goal 1 (Week 2): Pt will complete toilet transfer with a stand pivot with mod A or less on a consistent basis. OT Short Term Goal 2 (Week 2): Pt will be able to ambulate 8 feet to be able to access her bathroom. OT Short Term Goal 3 (Week 2): Pt will be able to stand and pull pants down to prepare for toileting. OT Short Term Goal 4 (Week 2): Pt will be able to pull pants up with mod A.  Skilled Therapeutic Interventions/Progress Updates:    Pt received sitting in w/c with c/o mild pain in her L hip, agreeable to toileting tasks. Pt completed sit > stand from the w/c with min A. She used RW to complete functional mobility into the bathroom with min A overall, more like mod A to get over inclined threshold into bathroom. Pt required mod A with toileting tasks overall. She was able to stand for hygiene posteriorly, with min A provided for thoroughness. Pt returned to the sink for hand hygiene with min A. Pt was left sitting in the w/c with all needs met, husband present.   Therapy Documentation Precautions:  Precautions Precautions: Fall Restrictions Weight Bearing Restrictions: Yes LUE Weight Bearing: Weight bearing as tolerated LLE Weight Bearing: Weight bearing as tolerated  Therapy/Group: Individual Therapy  Curtis Sites 05/12/2020, 6:24 AM

## 2020-05-12 NOTE — Progress Notes (Signed)
Physical Therapy Session Note  Patient Details  Name: [name] MRN: [id number] Date of Birth: 09/09/1933  Today's Date: 05/12/2020 PT Individual Time: 1300-1415 PT Individual Time Calculation (min): 75 min   Short Term Goals: Week 2:  PT Short Term Goal 1 (Week 2): Pt will consistently perform bed mobility with modA PT Short Term Goal 2 (Week 2): Pt will consistently perform sit<>stand transfers with modA and LRAD PT Short Term Goal 3 (Week 2): Pt will perform bed<>chair transfers wtih modA PT Short Term Goal 4 (Week 2): Pt will ambulate 88ft with modA and LRAD  Skilled Therapeutic Interventions/Progress Updates:    Pt received sitting upright in w/c, husband at bedside, pt agreeable to therapy. She reports mild L hip pain, unrated. Redirection, repositioning, and mobility provided for pain management. Pt reporting urgent need to have a BM. Husband requesting we use Stedy for transfer but educated him on importance of progressions away from stedy to promote indep which he voiced understanding. Sit<>stand with minA to RW from w/c and she ambulated with minA and RW to her bathroom, ~13ft. She required min cues for safety approach to the 3-1 BSC over toilet and she was able to manage her lower body dressing with minA while unsupported standing. Required minA for controlled lowering to 3-1 and pt was continent of bowel and bladder. Sit<>stand with minA to RW and she required modA for posterior pericare while standing with RW support. She was able to reach down below knees to pull her pants up but required maxA for pulling them over her buttock. Ambulated with minA and RW to her sink, ~54ft, and she performed hand hygiene with minA guard. Stand>sit with minA for controlled lowering to her w/c and she was transported in w/c for time management to ortho gym. Instructed her on car transfer and therapist provided demonstration for improved carryover. She was able to transfer into car with minA and RW but  she was unable to swing her L leg into the car due to limited L knee ROM and pain. Educated her on techniques and strategies to improve this such as sliding car seat backwards and reclining as needed. She ambulated up/down ~26ft ramp with minA and RW, cues for L weight shift, forward gaze, and correcting deficits. Pt reports her ramp at home is a little steeper than ours and that at baseline, she ambulated up/down it with RW without difficulty. Focused remainder of session on functional transfers. Performed blocked practice stand<>pivot transfers with RW, x6 total reps, from mat table to standard chair. From mat table, she requires minA for producing power to rise. From standard chair with armrests, she's able to complete with CGA and RW. Cues provided for safety approach, stepping patterns, and RW management. Gait x21ft with minA and RW. Gait deficits continue to show antalgic step-to pattern with forward flexed trunk and downward gaze, No LOB or knee buckling noted. Pt transported remaining distance back to her room and patient remained seated in w/c with needs in reach, Husband updated on patient's mobility status.  Therapy Documentation Precautions:  Precautions Precautions: Fall Restrictions Weight Bearing Restrictions: Yes LUE Weight Bearing: Weight bearing as tolerated LLE Weight Bearing: Weight bearing as tolerated  Therapy/Group: Individual Therapy  Rodgerick Gilliand P Eriel Dunckel PT 05/12/2020, 7:32 AM

## 2020-05-12 NOTE — Progress Notes (Signed)
Occupational Therapy Session Note  Patient Details  Name: [name] MRN: [id number] Date of Birth: March 18, 1934  Today's Date: 05/12/2020 OT Individual Time: 1100-1200 OT Individual Time Calculation (min): 60 min    Short Term Goals: Week 2:  OT Short Term Goal 1 (Week 2): Pt will complete toilet transfer with a stand pivot with mod A or less on a consistent basis. OT Short Term Goal 2 (Week 2): Pt will be able to ambulate 8 feet to be able to access her bathroom. OT Short Term Goal 3 (Week 2): Pt will be able to stand and pull pants down to prepare for toileting. OT Short Term Goal 4 (Week 2): Pt will be able to pull pants up with mod A.  Skilled Therapeutic Interventions/Progress Updates:    Pt sitting up in w/c, reports she bathed with nurse tech this morning, agreeable to OT session.  Pt reports she has a shower with a couple inch threshold at home and able to use walker to step in / out of using RW.  Pt does not have grab bars, but does have a shower bench. Pt transported to ADL suite for block practice shower threshold transfer using RW.  Pt able to complete x 2 trials with CGA, increased time, and no LOB.  Pt required min VCs to improve RW to keep AD closer when stepping over threshold.  Pt required RB in between trials, OT provided education regarding safety during shower level bathing including donning of supportive footwear while getting in/out of shower, completely drying off self and floor prior to exiting shower, and completing bathing in seated position using lateral leaning technique.  Pt reports good understanding of education.  Pt participated in UBE x 6 minutes endurance training to improve activity tolerance for ADLs. Pt required RBs every 2 minute interval due to fatigue and slight shortness of breath.  Pt transported back to room.  DC planning discussion with husband regarding DME needs, home layout, and husbands availability for assist.  Pt will require a bedside commode at  discharge to increase safety and independence during toilet transfers and toileting.  Call bell in reach, seat alarm on.    Therapy Documentation Precautions:  Precautions Precautions: Fall Restrictions Weight Bearing Restrictions: Yes LUE Weight Bearing: Weight bearing as tolerated LLE Weight Bearing: Weight bearing as tolerated   Therapy/Group: Individual Therapy  Ezekiel Slocumb 05/12/2020, 12:23 PM

## 2020-05-12 NOTE — Progress Notes (Signed)
ANTICOAGULATION CONSULT NOTE - Follow Up Consult  Pharmacy Consult for Warfarin Indication: atrial fibrillation  Allergies  Allergen Reactions  . Pradaxa [Dabigatran Etexilate Mesylate] Other (See Comments)    INTERNAL BLEEDING  . Clindamycin/Lincomycin Other (See Comments)    PT STATES HER DOCTOR TOLD HER NOT TO TAKE CLINDAMYCIN BECAUSE SHE GOT C-DIFF AFTER TAKING AMPICILLIN- "tolerated in 2022" (per spouse)  . Latex Other (See Comments)    Blisters   . Ampicillin Other (See Comments)    C. Diff after taking ampicillin - "tolerated in 2022" (per husband) Pt has received cephalosporins in 2013, 2014, 2017, 2019, 2022   . Sulfamethoxazole Diarrhea    Patient Measurements: Height: 5\' 4"  (162.6 cm) Weight: 87.3 kg (192 lb 7.4 oz) IBW/kg (Calculated) : 54.7  Vital Signs: Temp: 98 F (36.7 C) (02/14 0507) BP: 187/60 (02/14 0507) Pulse Rate: 74 (02/14 0507)  Labs: Recent Labs    05/10/20 0539 05/11/20 0506 05/12/20 0513  HGB  --   --  8.4*  HCT  --   --  27.2*  PLT  --   --  335  LABPROT 33.2* 28.4* 22.7*  INR 3.4* 2.8* 2.1*  CREATININE  --   --  1.22*    Estimated Creatinine Clearance: 36.7 mL/min (A) (by C-G formula based on SCr of 1.22 mg/dL (H)).  Assessment: 87 year old with PMH significant for PAF on warfarin, recurrent UTI, HTN, HLD admitted to [facility] 04/22/2020 with acute closed left intratrochanteric hip fracture.Patient developed worsening dysphagia, MRI was obtain which showed acute stroke. Warfarin held per neurology recommendation. Pharmacy consulted to restart Warfarin on 1/31.  INR was 2.3 upon admit. PTA warfarin dose is 5 mg daily EXCEPT for 7.5 mg on Tues/Thurs.  INR is therapeutic at 2.1 today, last warfarin dose of 7.5 mg on 2/10. The patient's INR dropped fairly significantly from 3.4 to 2.8 to 2.1 over last 3 days, therefore will give normal dose of warfarin tonight to prevent patient from dropping subtherapeutic. The patient also started  cephalexin, will monitor INR closely due to interaction. Last Hgb low at 8.4, platelets 335.  Goal of Therapy:  INR 2-3 Monitor platelets by anticoagulation protocol: Yes   Plan:  Give warfarin 5 mg PO tonight Daily PT/INR and weekly CBC qMon Monitor for signs and symptoms of bleeding  Hasset Chaviano A. Levada Dy, PharmD, BCPS, FNKF Clinical Pharmacist  Please utilize Amion for appropriate phone number to reach the unit pharmacist ([place])

## 2020-05-13 LAB — PROTIME-INR
INR: 2 — ABNORMAL HIGH (ref 0.8–1.2)
Prothrombin Time: 21.6 seconds — ABNORMAL HIGH (ref 11.4–15.2)

## 2020-05-13 MED ORDER — BLOOD PRESSURE CONTROL BOOK
Freq: Once | Status: AC
  Filled 2020-05-13: qty 1

## 2020-05-13 MED ORDER — LOSARTAN POTASSIUM 50 MG PO TABS
75.0000 mg | ORAL_TABLET | Freq: Every day | ORAL | Status: DC
  Administered 2020-05-13 – 2020-05-21 (×9): 75 mg via ORAL
  Filled 2020-05-13 (×10): qty 2

## 2020-05-13 MED ORDER — WARFARIN SODIUM 7.5 MG PO TABS
7.5000 mg | ORAL_TABLET | Freq: Once | ORAL | Status: AC
  Administered 2020-05-13: 7.5 mg via ORAL
  Filled 2020-05-13: qty 1

## 2020-05-13 MED ORDER — CALCIUM CARBONATE ANTACID 500 MG PO CHEW
1.0000 | CHEWABLE_TABLET | ORAL | Status: DC | PRN
  Administered 2020-05-13 – 2020-05-21 (×5): 200 mg via ORAL
  Filled 2020-05-13 (×5): qty 1

## 2020-05-13 NOTE — Discharge Summary (Addendum)
Physician Discharge Summary  Patient ID: [name] MRN: [id number] DOB/AGE: 1934-03-05 87 y.o.  Admit date: 04/29/2020 Discharge date: 05/22/2020  Discharge Diagnoses:  Principal Problem:   Intertrochanteric fracture of left hip [facility]) Active Problems:   Pressure injury of skin   Congestion of nasal sinus   Stage 3 chronic kidney disease (HCC)   Benign essential HTN   Labile blood pressure   Anemia, chronic disease   Essential hypertension   PAF (paroxysmal atrial fibrillation) (HCC)   Post-operative pain   Gastroesophageal reflux disease   Recurrent UTI   Sleep disturbance Hyperlipidemia Hard of hearing History of bilateral mastectomy Left femur fracture May 2019 Small acute infarct right cerebellum, bilateral occipital parietal lobes in the high left precentral gyrus Chronic diarrhea  Discharged Condition: Stable  Significant Diagnostic Studies: DG Chest 1 View  Result Date: 04/22/2020 CLINICAL DATA:  Preoperative evaluation EXAM: CHEST  1 VIEW COMPARISON:  08/06/2017 FINDINGS: Chronic interstitial prominence with scarring at the lung bases. Possible mild superimposed interstitial edema. Mild cardiomegaly. No pleural effusion or pneumothorax. IMPRESSION: Chronic interstitial prominence with possible superimposed mild interstitial edema. Mild cardiomegaly. Electronically Signed   By: Macy Mis M.D.   On: 04/22/2020 16:09   MR ANGIO HEAD WO CONTRAST  Result Date: 04/27/2020 CLINICAL DATA:  Stroke follow-up EXAM: MRA HEAD WITHOUT CONTRAST TECHNIQUE: Angiographic images of the Circle of Willis were obtained using MRA technique without intravenous contrast. COMPARISON:  MRI head 04/27/2020 FINDINGS: Suboptimal image quality due to patient motion. In addition, there is venous contrast present. I discussed with the technologist from the study who insisted no contrast was injected prior to the MRA of the head Left vertebral artery dominant and supplies the basilar. Small right  vertebral artery patent to the basilar. Basilar is irregular but patent. Posterior cerebral arteries are patent bilaterally with mild atherosclerotic irregularity. Internal carotid artery patent through the cavernous segment. There is considerable cavernous sinus contrast obscuring the cavernous carotid. Anterior and middle cerebral arteries patent bilaterally. Moderate stenosis in the anterior and middle cerebral arteries bilaterally. No large vessel occlusion or aneurysm. IMPRESSION: Suboptimal study due to venous contrast and patient motion Moderate intracranial atherosclerotic disease. No large vessel occlusion. Electronically Signed   By: Franchot Gallo M.D.   On: 04/27/2020 17:00   MR ANGIO NECK W WO CONTRAST  Result Date: 04/27/2020 CLINICAL DATA:  Stroke EXAM: MRA NECK WITHOUT AND WITH CONTRAST TECHNIQUE: Multiplanar and multiecho pulse sequences of the neck were obtained without and with intravenous contrast. Angiographic images of the neck were obtained using MRA technique without and with intravenous contrast. CONTRAST:  8.55mL GADAVIST GADOBUTROL 1 MMOL/ML IV SOLN COMPARISON:  None. FINDINGS: Suboptimal study due to extensive venous contamination. During contrast infusion, the MRI equipment did not automatically begin scanning during the arterial phase. Scanning was started manually during the venous phase. In addition, there is motion on the study degrading image quality. Carotid artery is patent bilaterally. Both vertebral arteries are patent. IMPRESSION: Limited study due to venous phase scanning and patient motion. No large vessel occlusion in the neck. Electronically Signed   By: Franchot Gallo M.D.   On: 04/27/2020 17:03   MR BRAIN WO CONTRAST  Result Date: 04/27/2020 CLINICAL DATA:  Cranial neuropathy 9 EXAM: MRI HEAD WITHOUT CONTRAST TECHNIQUE: Multiplanar, multiecho pulse sequences of the brain and surrounding structures were obtained without intravenous contrast. COMPARISON:  01/09/2017  FINDINGS: Brain: Subcentimeter acute infarcts in the right cerebellum, bilateral occipitoparietal cortex, and high left precentral gyrus. There has  been a large remote left cerebellar infarction. Small remote occipital parietal infarcts. Confluent chronic small vessel ischemia in the cerebral white matter. Chronic small vessel ischemia to a milder degree in the brainstem where there is no visible acute infarct. Chronic ischemic injury to the deep gray nuclei accentuated by dilated perivascular spaces. Remote hypertensive pattern micro hemorrhages in the deep cerebellum and supratentorial brain. Generalized brain atrophy. No acute hemorrhage, hydrocephalus, or masslike finding. Vascular: Preserved flow voids Skull and upper cervical spine: Normal marrow signal. C4-5 facet ankylosis. Sinuses/Orbits: Bilateral cataract resection. Minor mucosal thickening in paranasal sinuses. Right mastoidectomy. IMPRESSION: 1. Small acute infarcts in the right cerebellum, bilateral occipital parietal lobes, and high left precentral gyrus. 2. No acute infarct to correlate with the cranial nerve deficit. Thin slices through the brainstem could not be obtained due to patient condition. 3. Advanced chronic ischemic injury. Electronically Signed   By: Monte Fantasia M.D.   On: 04/27/2020 06:00   DG CHEST PORT 1 VIEW  Result Date: 04/26/2020 CLINICAL DATA:  Cough. EXAM: PORTABLE CHEST 1 VIEW COMPARISON:  04/22/2020 FINDINGS: Stable enlarged cardiac silhouette and tortuous and calcified thoracic aorta. The lungs remain clear with normal vascularity. Stable mild interstitial prominence without Kerley lines. Diffuse osteopenia. IMPRESSION: No acute abnormality. Stable cardiomegaly and mild chronic interstitial lung disease. Electronically Signed   By: Claudie Revering M.D.   On: 04/26/2020 12:18   DG Humerus Left  Result Date: 04/22/2020 CLINICAL DATA:  Fall, left arm pain EXAM: LEFT HUMERUS - 2+ VIEW COMPARISON:  08/09/2017 FINDINGS:  Degenerative changes in the left [facility] joint. No acute bony abnormality. Specifically, no fracture, subluxation, or dislocation. IMPRESSION: No acute bony abnormality. Electronically Signed   By: Rolm Baptise M.D.   On: 04/22/2020 18:09   DG C-Arm 1-60 Min  Result Date: 04/23/2020 CLINICAL DATA:  Left intratrochanteric hip fracture EXAM: DG C-ARM 1-60 MIN; OPERATIVE LEFT HIP WITH PELVIS COMPARISON:  04/22/2020 FLUOROSCOPY TIME:  Fluoroscopy Time:  1 minutes 59 seconds Radiation Exposure Index (if provided by the fluoroscopic device): 23.44 mGy Number of Acquired Spot Images: 6 FINDINGS: Fixation sideplate is again noted along the mid to distal femur. Fracture fragments have been reduced initially with placement of a proximal medullary rod with 2 fixation screws traversing the femoral neck. Fracture fragments are in near anatomic alignment. IMPRESSION: ORIF of proximal left femur fracture. Electronically Signed   By: Inez Catalina M.D.   On: 04/23/2020 16:07   ECHOCARDIOGRAM COMPLETE  Result Date: 04/27/2020    ECHOCARDIOGRAM REPORT   Patient Name:   JAKYIA GACCIONE Date of Exam: 04/27/2020 Medical Rec #:  [id number]    Height:       64.0 in Accession #:    [id number]   Weight:       188.9 lb Date of Birth:  01-27-1934    BSA:          1.910 m Patient Age:    78 years     BP:           170/62 mmHg Patient Gender: F            HR:           87 bpm. Exam Location:  Inpatient Procedure: 2D Echo, Color Doppler and Cardiac Doppler Indications:    Stroke 434.91 / I163.9  History:        Patient has prior history of Echocardiogram examinations, most  recent 08/07/2017. Stroke, Arrythmias:Atrial Fibrillation; Risk                 Factors:Hypertension and Dyslipidemia. GERD.  Sonographer:    Darlina Sicilian RDCS Referring Phys: [id number] [place]  Sonographer Comments: Exam terminated per patient's request. IMPRESSIONS  1. Left ventricular ejection fraction, by estimation, is 70 to 75%. The left ventricle  has hyperdynamic function. The left ventricle has no regional wall motion abnormalities. There is mild concentric left ventricular hypertrophy. Left ventricular diastolic function could not be evaluated.  2. Right ventricular systolic function is normal. The right ventricular size is normal. There is normal pulmonary artery systolic pressure.  3. Left atrial size was moderately dilated.  4. The mitral valve is normal in structure. No evidence of mitral valve regurgitation. No evidence of mitral stenosis. Moderate mitral annular calcification.  5. The aortic valve is tricuspid. Aortic valve regurgitation is mild. Mild aortic valve sclerosis is present, with no evidence of aortic valve stenosis.  6. The inferior vena cava is normal in size with greater than 50% respiratory variability, suggesting right atrial pressure of 3 mmHg. Conclusion(s)/Recommendation(s): Incomplete exam. Further imaging declined by the patient. Unable to evaluate for atrial shunting or assess diastolic function. FINDINGS  Left Ventricle: Left ventricular ejection fraction, by estimation, is 70 to 75%. The left ventricle has hyperdynamic function. The left ventricle has no regional wall motion abnormalities. The left ventricular internal cavity size was normal in size. There is mild concentric left ventricular hypertrophy. Left ventricular diastolic function could not be evaluated. Right Ventricle: The right ventricular size is normal. No increase in right ventricular wall thickness. Right ventricular systolic function is normal. There is normal pulmonary artery systolic pressure. The tricuspid regurgitant velocity is 2.78 m/s, and  with an assumed right atrial pressure of 3 mmHg, the estimated right ventricular systolic pressure is 72.5 mmHg. Left Atrium: Left atrial size was moderately dilated. Right Atrium: Right atrial size was normal in size. Pericardium: There is no evidence of pericardial effusion. Mitral Valve: The mitral valve is normal  in structure. Moderate mitral annular calcification. No evidence of mitral valve regurgitation. No evidence of mitral valve stenosis. Tricuspid Valve: The tricuspid valve is normal in structure. Tricuspid valve regurgitation is not demonstrated. No evidence of tricuspid stenosis. Aortic Valve: The aortic valve is tricuspid. Aortic valve regurgitation is mild. Mild aortic valve sclerosis is present, with no evidence of aortic valve stenosis. Pulmonic Valve: The pulmonic valve was grossly normal. Pulmonic valve regurgitation is not visualized. No evidence of pulmonic stenosis. Aorta: The aortic root is normal in size and structure. Venous: The inferior vena cava is normal in size with greater than 50% respiratory variability, suggesting right atrial pressure of 3 mmHg. IAS/Shunts: The interatrial septum was not assessed.  LEFT VENTRICLE PLAX 2D LVIDd:         4.40 cm LVIDs:         2.90 cm LV PW:         1.20 cm LV IVS:        1.30 cm LVOT diam:     2.10 cm LVOT Area:     3.46 cm  LEFT ATRIUM           Index LA diam:      1.70 cm 0.89 cm/m LA Vol (A4C): 98.2 ml 51.42 ml/m   AORTA Ao Root diam: 3.70 cm Ao Asc diam:  3.40 cm TRICUSPID VALVE TR Peak grad:   30.9 mmHg TR Vmax:  278.00 cm/s  SHUNTS Systemic Diam: 2.10 cm Sanda Klein MD Electronically signed by Sanda Klein MD Signature Date/Time: 04/27/2020/2:02:17 PM    Final    DG HIP PORT UNILAT W OR W/O PELVIS 1V LEFT  Result Date: 04/23/2020 CLINICAL DATA:  Postop ORIF for left hip fracture. EXAM: DG HIP (WITH OR WITHOUT PELVIS) 1V PORT LEFT COMPARISON:  Radiographs 04/22/2020. Intraoperative view is earlier today. FINDINGS: Status post ORIF of the intertrochanteric left femur fracture with a intramedullary nail and 2 fixation screws traversing the femoral neck. The hardware is well positioned. There is improved alignment of the main fracture fragments. Pre-existing lateral femoral diaphyseal plate and screws are incompletely visualized, but appear  intact. Two of the proximal screws from that plate have been removed in the interval. IMPRESSION: Improved alignment of intertrochanteric left femur fracture status post ORIF. Electronically Signed   By: Richardean Sale M.D.   On: 04/23/2020 16:57   DG HIP OPERATIVE UNILAT W OR W/O PELVIS LEFT  Result Date: 04/23/2020 CLINICAL DATA:  Left intratrochanteric hip fracture EXAM: DG C-ARM 1-60 MIN; OPERATIVE LEFT HIP WITH PELVIS COMPARISON:  04/22/2020 FLUOROSCOPY TIME:  Fluoroscopy Time:  1 minutes 59 seconds Radiation Exposure Index (if provided by the fluoroscopic device): 23.44 mGy Number of Acquired Spot Images: 6 FINDINGS: Fixation sideplate is again noted along the mid to distal femur. Fracture fragments have been reduced initially with placement of a proximal medullary rod with 2 fixation screws traversing the femoral neck. Fracture fragments are in near anatomic alignment. IMPRESSION: ORIF of proximal left femur fracture. Electronically Signed   By: Inez Catalina M.D.   On: 04/23/2020 16:07   DG Hip Unilat With Pelvis 2-3 Views Left  Result Date: 04/22/2020 CLINICAL DATA:  Fall, pain EXAM: DG HIP (WITH OR WITHOUT PELVIS) 2-3V LEFT COMPARISON:  08/08/2017 FINDINGS: Osteopenia. Mildly displaced and impacted intratrochanteric fractures of the proximal left femur. The pelvis and proximal right femur appear intact in frontal view only. Partially imaged plate and screw fixation of the femoral diaphysis. IMPRESSION: 1. Mildly displaced and impacted intratrochanteric fractures of the proximal left femur. 2. The pelvis and proximal right femur appear intact in frontal view only. Electronically Signed   By: Eddie Candle M.D.   On: 04/22/2020 16:23    Labs:  Basic Metabolic Panel: Recent Labs  Lab 05/16/20 0510 05/21/20 1124  NA 140 138  K 4.2 4.0  CL 106 104  CO2 24 22  GLUCOSE 115* 115*  BUN 29* 21  CREATININE 1.29* 1.17*  CALCIUM 8.5* 8.6*    CBC: Recent Labs  Lab 05/19/20 0957  WBC 10.0   NEUTROABS 8.4*  HGB 9.0*  HCT 27.9*  MCV 90.9  PLT 307    CBG: No results for input(s): GLUCAP in the last 168 hours. Family history.  Mother with CAD and CVA.  Father with CAD.  Denies any colon cancer esophageal cancer rectal cancer  Brief HPI:   [name] is a 87 y.o. right-handed female with history of atrial fibrillation on chronic Coumadin, CKD stage III with baseline 1.46 breast cancer with bilateral mastectomy 1990s hearing impaired hypertension TIA without residual weakness recurrent UTI hyperlipidemia bilateral total knee replacements as well as left femur fracture May 2019 with ORIF.  Per chart review lives with spouse 1 level home with ramped entrance.  Ambulates with a rolling walker.  Requires some assistance for lower body ADLs.  Presented 04/22/2020 after a fall without loss of consciousness landing on her left hip.  She stated she leaned too far forward falling out of her wheelchair.  Admission chemistries hemoglobin 10.1 WBC 11,700 BUN 30 creatinine 1.46 urinalysis negative nitrite INR 2.3.  X-rays and imaging revealed left intertrochanteric femur fracture as well as healed distal femur fracture.  Patient did receive vitamin K to reverse Coumadin.  Underwent nailing of left intertrochanteric femur fracture removal of hardware left femur 04/23/2020 per Dr. Doreatha Martin.  Weightbearing as tolerated left lower extremity.  Postoperative chronic Coumadin resumed.  Acute blood loss anemia 6.8 she did receive 1 unit packed red blood cells latest hemoglobin 8.0.  Creatinine remained stable 1.75 from baseline 1.46.  On 04/27/2020 patient altered mental status reportedly having difficulty swallowing.  MRI of the brain showed small acute infarct in the right cerebellum bilateral occipital parietal lobes and high left precentral gyrus.  Patient did not receive TPA.  MRA of the head and neck showed no large vessel occlusion.  Echocardiogram with ejection fraction of 70 to 75% no regional wall motion  abnormalities.  Patient's chronic Coumadin initially placed on hold and since resumed no bleeding episodes.  Tolerating regular diet.  Therapy evaluations completed due to patient decreased functional mobility was admitted for a comprehensive rehab program.   Hospital Course: [name] was admitted to rehab 04/29/2020 for inpatient therapies to consist of PT, ST and OT at least three hours five days a week. Past admission physiatrist, therapy team and rehab RN have worked together to provide customized collaborative inpatient rehab.  Pertaining to patient's left intertrochanteric femur fracture as well as history of left femur fracture May 2019 status post IM nailing as well as removal of hardware left femur from previous fracture 04/23/2020.  Weightbearing as tolerated.  Her hospital course was complicated postoperatively by small acute infarct right cerebellum bilateral occipital parietal lobes in the high left precentral gyrus.  She continued on chronic Coumadin no bleeding episodes and a home health nurse was arranged check INR 05/26/2020 with results to [facility] with latest INR 2.4.  Pain managed with use of oxycodone as well as Robaxin.  Acute blood loss anemia stable latest hemoglobin 9.0.  Blood pressure heart rate with noted history of atrial fibrillation monitored with Toprol she continued on Coumadin cardiac rate controlled she would follow-up outpatient.  Blood pressure monitored on Cozaar 50 mg titrated to 75 mg nightly she continued on her Toprol-XL at 37.5 mg daily.  CKD stage III baseline creatinine questionable 1.46 latest creatinine 1.29.  Lipitor ongoing for hyperlipidemia.  Noted recurrent UTI initially placed on Keflex changed to Cipro 250 mg p.o. twice daily completing course after cultures returned grade 100,000 Klebsiella as well as Pseudomonas.  Patient was very hard of hearing she responded well to her left side.  Noted chronic diarrhea use Imodium as directed.   Blood  pressures were monitored on TID basis and controlled     Rehab course: During patient's stay in rehab weekly team conferences were held to monitor patient's progress, set goals and discuss barriers to discharge. At admission, patient required max assist of 2 side-lying to sitting max assist for rolling.  Max assist sit to stand.  Minimal assist for eating  Physical exam.  Blood pressure 110/70 pulse 70 respirations 18 oxygen saturations 92% room air  Constitutional.  Hard of hearing no acute distress HEENT.  Normocephalic Eyes.  Pupils round and reactive to light no discharge.nystagmus Neck.  Supple nontender no JVD without thyromegaly Cardiac irregular irregular Abdomen.  Soft nontender positive bowel sounds  without rebound Respiratory effort normal no respiratory distress without wheeze Musculoskeletal.  Normal range of motion no rigidity Comments.  Upper extremities 4+/5 in bilateral upper extremities biceps triceps grip finger abduction however very TTP over left bioccipital tendon Lower extremities hip flexion 2/5 on right to minus/5 on left due to pain, knee extension 3/5 bilaterally dorsiflexion plantarflexion 5 -/5 bilaterally Skin.  Left hip incision dressed appropriately tender very bruised Neurologic.  Alert no acute distress oriented x3 and follows commands very hard of hearing   He/She  has had improvement in activity tolerance, balance, postural control as well as ability to compensate for deficits. He/She has had improvement in functional use RUE/LUE  and RLE/LLE as well as improvement in awareness.  Sit to stand minimal assist rolling walker from wheelchair and she ambulates minimal assist rolling walker to the bathroom 15 feet..  She required minimal cues for safety approach.  Bedside commode over toilet she was able to manage her lower body dressing with minimal assist while unsupported standing.  Required minimal assist for controlled lowering to 3 in 1 commode.  Sit to stand  minimal assist rolling walker required moderate assist for posterior pericare.  She can ambulate minimal assist rolling walker to the sink.  She was able to transfer into car with minimal assist rolling walker but needed some assistance for lower extremities.  Perform blocked practice stand pivot transfer with rolling walker x6 total repetitions from mat table to standard chair.  Ambulates 15 feet rolling walker minimal assist within her room.  Full family teaching completed plan discharge to home       Disposition: Discharge to home    Diet: Regular  Special Instructions: No driving smoking or alcohol  Home health nurse to check INR on 05/26/2020 results to [facility] [phone] fax [phone]  Medications at discharge 1.  Tylenol as needed 2.  Lipitor 20 mg p.o. after supper 3.  Cipro 250 mg p.o. twice daily x5 days 4.  Vitamin D 1000 units p.o. daily 5.  Voltaren gel 2 g 4 times daily 6.  Colace 100 mg p.o. twice daily 7.  Cozaar 75 mg p.o. nightly 8.  Robaxin 500 mg p.o. every 6 hours as needed muscle spasms 9.  Toprol-XL 37.5 mg p.o. daily 10.  Oxycodone 1 tablet every 6 hours as needed moderate pain 11.  Protonix 40 mg p.o. daily 12.  Vitamin B12 1000 mcg p.o. daily 13.  Drisdol 50,000 units p.o. every 7 days 14.  Coumadin latest dose 5 mg adjusted accordingly for INR 2.0-3.0 15.  Multivitamin daily 16.  Hydralazine 25 mg nightly 17.  Melatonin 3 mg nightly 18.  Estrace cream 0.1 mg twice daily vaginally  30-35 minutes were spent completing discharge summary and discharge planning  Discharge Instructions     Ambulatory referral to Neurology   Complete by: As directed    An appointment is requested in approximately 4 weeks right cerebellum bilateral occipital parietal CVA   Ambulatory referral to Physical Medicine Rehab   Complete by: As directed    Moderate complexity follow-up 2 weeks left intertrochanteric hip fracture complicated  by right cerebellum bilateral occipital parietal infarction        Follow-up Information     Jamse Arn, MD Follow up.   Specialty: Physical Medicine and Rehabilitation Why: Office to call for appointment Contact information: [address] [place] [address] [phone]         Haddix,  Thomasene Lot, MD Follow up.   Specialty: Orthopedic Surgery Why: Call for appointment Contact information: [place] [address] [phone]         Jerline Pain, MD Follow up.   Specialty: Cardiology Why: Call for appointment Contact information: [address] [place] [address] [phone]                 Signed: Cathlyn Parsons 05/22/2020, 5:17 AM Patient was seen, face-face, and physical exam performed by me on day of discharge, greater than 30 minutes of total time spent.. Please see progress note from day of discharge as well.  Delice Lesch, MD, ABPMR

## 2020-05-13 NOTE — Progress Notes (Signed)
Physical Therapy Session Note  Patient Details  Name: [name] MRN: [id number] Date of Birth: 10-06-1933  Today's Date: 05/13/2020 PT Individual Time: 1000-1055 + 8588-5027 PT Individual Time Calculation (min): 55 min  + 58 min  Short Term Goals: Week 2:  PT Short Term Goal 1 (Week 2): Pt will consistently perform bed mobility with modA PT Short Term Goal 2 (Week 2): Pt will consistently perform sit<>stand transfers with modA and LRAD PT Short Term Goal 3 (Week 2): Pt will perform bed<>chair transfers wtih modA PT Short Term Goal 4 (Week 2): Pt will ambulate 56ft with modA and LRAD  Skilled Therapeutic Interventions/Progress Updates:    1st session: Pt received sitting upright inw/c, husband at bedside, patient agreeable to therapy. Reports mild L hip pain, provided rest breaks and repositioning for pain management. Spoke with husband regarding extending patient's stay in order to have her meet goal level and improve indep to reduce falls risk and caregiver burden, he voiced understanding. W/c transport to main rehab gym for time management. Performed stand<>pivot with minA and RW from w/c to mat table. Requires minA for standing to RW from mat table height and performed unsupported ball toss with therapist providing minA guard and rehab tech tossing ball, focusing on righting reactions, dynamic unsupported balance, and functional reaching outside cone of stability. She was then instructed to perform lateral side stepping, L<>R, along mat table, ~5-52ft each direction with RW support and CGA. Emphasis on foot clearance, step length, and postural awareness. Performed this x2 bouts with seated rest break. She then was instructed on completing the TUG, which she did x1 trials with RW and CGA for safety. 1st trial: 3 minutes and 42 seconds. Pt deferring a 2nd trial due to fatigue and L leg soreness. Therefore, instructed on seated marches with target for cone on LLE to emphasize L knee/hip ROM and L  hip strengthening, she used her arms to assist completing the motion for AAROM. Stand<>pivot with minA and RW back to her w/c and she was returned to her room with [employer] for energy conservation. She remained seated in w/c at end of session with needs within reach and husband at bedside.   2nd session: Pt received sitting upright in w/c, husband at bedside, pt agreeable to therapy. Pt expresses gratitude for extending her stay to next week, reports she will feel much more confident and comfortable with more time to become proficient at functional mobility. Pt propelled herself ~62ft with supervision and use of BUE's, demo's very slow speed and poor efficiency. She then propelled herself ~28ft in w/c, using BLE's only, focusing on L knee AROM and strengthening, distances limited by fatigue. Focused remainder of session on functional gait training. She ambulated 73ft + 66ft +77ft (seated rest breaks) with CGA and RW and required a heavy minA for sit<>stands to RW from her w/c. Performed x10 minutes of Kinetron while seated in w/c, at 50 resistance, focusing on LLE strengthening and self active range of motion. She grimaces with movement but reports no pain while completing this. Pt returned to her room with totalA in her w/c and she ended session seated with needs in reach and husband at bedside.   Therapy Documentation Precautions:  Precautions Precautions: Fall Restrictions Weight Bearing Restrictions: Yes LUE Weight Bearing: Weight bearing as tolerated LLE Weight Bearing: Weight bearing as tolerated  Therapy/Group: Individual Therapy  Toneisha Savary P Rajohn Henery  PT 05/13/2020, 7:48 AM

## 2020-05-13 NOTE — Progress Notes (Signed)
Occupational Therapy Session Note  Patient Details  Name: [name] MRN: [id number] Date of Birth: 1933-10-21  Today's Date: 05/13/2020 OT Individual Time: 1345-1430 OT Individual Time Calculation (min): 45 min    Short Term Goals: Week 2:  OT Short Term Goal 1 (Week 2): Pt will complete toilet transfer with a stand pivot with mod A or less on a consistent basis. OT Short Term Goal 2 (Week 2): Pt will be able to ambulate 8 feet to be able to access her bathroom. OT Short Term Goal 3 (Week 2): Pt will be able to stand and pull pants down to prepare for toileting. OT Short Term Goal 4 (Week 2): Pt will be able to pull pants up with mod A.  Skilled Therapeutic Interventions/Progress Updates:    Pt sitting up in w/c, no c/o pain, requesting to use bathroom.  Pt ambulated to 3 in 1 commode over toilet in bathroom using RW with CGA and intermittent VCs to improve positioning of RW (keep RW closer to self).  Pt completed clothing mgt with min assist due to tight fitting pants and toilet transfer with CGA.  Pt had continent episode of bowel.  Pericare completed with setup and CGA in standing.  Pt ambulated to sink with increased time and using RW with CGA and again VCs for safe positioning of RW.  Pt leaning on elbows against sink due to fatigue requiring VCs to improve upright standing posture.  Pt completed pivot and then left side stepping requiring min assist with difficulty abducting left hip for successful side step, therefore w/c approached to pt for safety.  CGA stand to sit.  Call bell in reach.    Therapy Documentation Precautions:  Precautions Precautions: Fall Restrictions Weight Bearing Restrictions: Yes LUE Weight Bearing: Weight bearing as tolerated LLE Weight Bearing: Weight bearing as tolerated   Therapy/Group: Individual Therapy  Ezekiel Slocumb 05/13/2020, 4:04 PM

## 2020-05-13 NOTE — Progress Notes (Signed)
Pt c/o acid reflux. Pt requested to be placed in the sitting position, no relief. PRN Tums verbal order given from [place], [place].

## 2020-05-13 NOTE — Progress Notes (Signed)
ANTICOAGULATION CONSULT NOTE - Follow Up Consult  Pharmacy Consult for Warfarin Indication: atrial fibrillation  Allergies  Allergen Reactions  . Pradaxa [Dabigatran Etexilate Mesylate] Other (See Comments)    INTERNAL BLEEDING  . Clindamycin/Lincomycin Other (See Comments)    PT STATES HER DOCTOR TOLD HER NOT TO TAKE CLINDAMYCIN BECAUSE SHE GOT C-DIFF AFTER TAKING AMPICILLIN- "tolerated in 2022" (per spouse)  . Latex Other (See Comments)    Blisters   . Ampicillin Other (See Comments)    C. Diff after taking ampicillin - "tolerated in 2022" (per husband) Pt has received cephalosporins in 2013, 2014, 2017, 2019, 2022   . Sulfamethoxazole Diarrhea    Patient Measurements: Height: 5\' 4"  (162.6 cm) Weight: 84 kg (185 lb 3 oz) IBW/kg (Calculated) : 54.7  Vital Signs: Temp: 98.5 F (36.9 C) (02/15 0516) Temp Source: Oral (02/15 0516) BP: 159/64 (02/15 0516) Pulse Rate: 69 (02/15 0516)  Labs: Recent Labs    05/11/20 0506 05/12/20 0513 05/13/20 0456  HGB  --  8.4*  --   HCT  --  27.2*  --   PLT  --  335  --   LABPROT 28.4* 22.7* 21.6*  INR 2.8* 2.1* 2.0*  CREATININE  --  1.22*  --     Estimated Creatinine Clearance: 36 mL/min (A) (by C-G formula based on SCr of 1.22 mg/dL (H)).  Assessment: 87 year old with PMH significant for PAF on warfarin, recurrent UTI, HTN, HLD admitted to [facility] 04/22/2020 with acute closed left intratrochanteric hip fracture.Patient developed worsening dysphagia, MRI was obtain which showed acute stroke. Warfarin held per neurology recommendation. Pharmacy consulted to restart Warfarin on 1/31.  INR was 2.3 upon admit. PTA warfarin dose is 5 mg daily EXCEPT for 7.5 mg on Tues/Thurs.  INR is therapeutic at 2 today, last warfarin dose of 5 mg on 2/14. The patient's INR dropped fairly significantly from 3.4 to 2.8 to 2.1 to 2 over last several days, therefore will give PTA dose of warfarin tonight to prevent patient from dropping  subtherapeutic. The patient also started cephalexin, will monitor INR closely due to interaction. Last Hgb low at 8.4, platelets 335.  Goal of Therapy:  INR 2-3 Monitor platelets by anticoagulation protocol: Yes   Plan:  Give warfarin 7.5 mg PO tonight Daily PT/INR and weekly CBC qMon Monitor for signs and symptoms of bleeding  Prisila Dlouhy A. Levada Dy, PharmD, BCPS, FNKF Clinical Pharmacist [place] Please utilize Amion for appropriate phone number to reach the unit pharmacist ([place])

## 2020-05-13 NOTE — Plan of Care (Signed)
  Problem: RH Bed Mobility Goal: LTG Patient will perform bed mobility with assist (PT) Description: LTG: Patient will perform bed mobility with assistance, with/without cues (PT). Outcome: Not Applicable Note: DC bed mobility goal - pt does not sleep in a bed at home. She sleeps in a lift chair.   Problem: RH Balance Goal: LTG Patient will maintain dynamic standing balance (PT) Description: LTG:  Patient will maintain dynamic standing balance with assistance during mobility activities (PT) Flowsheets (Taken 05/13/2020 1251) LTG: Pt will maintain dynamic standing balance during mobility activities with:: Contact Guard/Touching assist   Problem: Sit to Stand Goal: LTG:  Patient will perform sit to stand with assistance level (PT) Description: LTG:  Patient will perform sit to stand with assistance level (PT) Flowsheets (Taken 05/13/2020 1251) LTG: PT will perform sit to stand in preparation for functional mobility with assistance level: Minimal Assistance - Patient > 75%   Problem: RH Bed to Chair Transfers Goal: LTG Patient will perform bed/chair transfers w/assist (PT) Description: LTG: Patient will perform bed to chair transfers with assistance (PT). Flowsheets (Taken 05/13/2020 1251) LTG: Pt will perform Bed to Chair Transfers with assistance level: Minimal Assistance - Patient > 75%   Problem: RH Car Transfers Goal: LTG Patient will perform car transfers with assist (PT) Description: LTG: Patient will perform car transfers with assistance (PT). Flowsheets (Taken 05/13/2020 1251) LTG: Pt will perform car transfers with assist:: Minimal Assistance - Patient > 75%   Problem: RH Ambulation Goal: LTG Patient will ambulate in controlled environment (PT) Description: LTG: Patient will ambulate in a controlled environment, # of feet with assistance (PT). Flowsheets (Taken 05/13/2020 1251) LTG: Pt will ambulate in controlled environ  assist needed:: Contact Guard/Touching assist LTG:  Ambulation distance in controlled environment: 109ft Goal: LTG Patient will ambulate in home environment (PT) Description: LTG: Patient will ambulate in home environment, # of feet with assistance (PT). Flowsheets (Taken 05/13/2020 1251) LTG: Pt will ambulate in home environ  assist needed:: Contact Guard/Touching assist LTG: Ambulation distance in home environment: 61ft   Goals upgraded due to improved activity tolerance, decreased pain inhibition, and improved ability to compensate for impairments.

## 2020-05-13 NOTE — Progress Notes (Signed)
Patient ID: [name], female   DOB: [birth year], 87 y.o.   MRN: [id number] Follow up with the patient and spouse regarding role of the nurse CM and educational needs. Spouse asking about [employer] coverage for DME and reviewed normal processing for bath and toileting DME. Noted he will purchase on his own. Reviewed secondary risk factors including A-fib, HTN and HLD. Noted that she had a stroke because they took her off the anticoag medication for the surgery. Spouse has previous experience providing care for the patient after she had bil knee surgery and hip fx.Encouraged increased protein in diet as Albumin is 2.7; reported patient has been eating eggs daily. Given information on DASH diet, and vitamin D deficiency dietary modifications. No other questions at present. Continue to follow along to discharge to address educational needs. Margarito Liner

## 2020-05-13 NOTE — Progress Notes (Signed)
Physical Therapy Session Note  Patient Details  Name: [name] MRN: [id number] Date of Birth: [birth year]  Today's Date: 05/13/2020 PT Individual Time: 8016-5537 PT Individual Time Calculation (min): 45 min   Short Term Goals: Week 2:  PT Short Term Goal 1 (Week 2): Pt will consistently perform bed mobility with modA PT Short Term Goal 2 (Week 2): Pt will consistently perform sit<>stand transfers with modA and LRAD PT Short Term Goal 3 (Week 2): Pt will perform bed<>chair transfers wtih modA PT Short Term Goal 4 (Week 2): Pt will ambulate 38ft with modA and LRAD  Skilled Therapeutic Interventions/Progress Updates:    Patient in w/c assisted to gym.  Spouse answering for her that she had medication and ate well.  Patient sit to stand x 5 with mod A overall improving with anterior weight shift throughout and able to stand with less assist.  Patient ambulated 39' with RW and min to mod A with w/c close follow. Patient performed sit to stand and stand pivot to mat with RW and min to mod A.  Performed sit to supine on mat to wedge with leg lifter and min to mod A with max cues for using R hand to continue to scoot hips and lower shoulders and leg lift to lift leg with assist.  Performed heel slides x 5, SAQ x 10 (unable to lift L but did quad set), and bridging x 5.  Walking feet over to EOB and using leg lifter with mod A supine to sit.  Patient performed stand pivot to w/c with RW and mod A.  Patient assisted to room in w/c and left with call bell and needs in reach with spouse in the room.   Therapy Documentation Precautions:  Precautions Precautions: Fall Restrictions Weight Bearing Restrictions: Yes LUE Weight Bearing: Weight bearing as tolerated LLE Weight Bearing: Weight bearing as tolerated Pain: Pain Assessment Pain Scale: 0-10 Pain Score: 8  Pain Type: Acute pain Pain Location: Hip Pain Orientation: Left Pain Descriptors / Indicators: Aching Pain Onset: On-going Pain  Intervention(s): Repositioned    Therapy/Group: Individual Therapy  Reginia Naas  Magda Kiel, PT 05/13/2020, 8:37 AM

## 2020-05-13 NOTE — Progress Notes (Signed)
[place] PHYSICAL MEDICINE & REHABILITATION PROGRESS NOTE  Subjective/Complaints: Patient seen sitting up in bed this morning.  She states she slept well overnight.  She notes decreasing pain.  Husband has questions if patient can be "checked out" on Saturdays.  ROS: Denies CP, SOB, N/V/D  Objective: Vital Signs: Blood pressure (!) 159/64, pulse 69, temperature 98.5 F (36.9 C), temperature source Oral, resp. rate 16, height 5\' 4"  (1.626 m), weight 84 kg, SpO2 95 %. No results found. Recent Labs    05/12/20 0513  WBC 5.3  HGB 8.4*  HCT 27.2*  PLT 335   Recent Labs    05/12/20 0513  NA 139  K 4.1  CL 105  CO2 24  GLUCOSE 116*  BUN 24*  CREATININE 1.22*  CALCIUM 8.5*    Intake/Output Summary (Last 24 hours) at 05/13/2020 0954 Last data filed at 05/13/2020 0700 Gross per 24 hour  Intake 780 ml  Output --  Net 780 ml        Physical Exam: BP (!) 159/64 (BP Location: Right Arm)   Pulse 69   Temp 98.5 F (36.9 C) (Oral)   Resp 16   Ht 5\' 4"  (1.626 m)   Wt 84 kg   SpO2 95%   BMI 31.79 kg/m   Constitutional: No distress . Vital signs reviewed. HENT: Normocephalic.  Atraumatic. Eyes: EOMI. No discharge. Cardiovascular: No JVD.  RRR. Respiratory: Normal effort.  No stridor.  Bilateral clear to auscultation. GI: Non-distended.  BS +. Skin: Warm and dry.  Intact. Psych: Normal mood.  Normal behavior. Musc: Left hip with improving edema and tenderness Neuro: Alert Motor: B/l UE: 4+/5 proximal distal Right lower extremity: Hip flexion, knee extension 2/5, ankle dorsiflexion 4/5 (pain inhibition), stable appearance Left extremity: Hip flexion, knee extension 1+/5, ankle dorsiflexion 3/5 with pain inhibition, some improvement HOH Right facial weakness, unchanged  Assessment/Plan: 1. Functional deficits which require 3+ hours per day of interdisciplinary therapy in a comprehensive inpatient rehab setting.  Physiatrist is providing close team supervision and  24 hour management of active medical problems listed below.  Physiatrist and rehab team continue to assess barriers to discharge/monitor patient progress toward functional and medical goals   Care Tool:  Bathing    Body parts bathed by patient: Right arm,Left arm,Chest,Abdomen,Right upper leg,Left upper leg,Face,Front perineal area   Body parts bathed by helper: Buttocks,Right lower leg,Left lower leg     Bathing assist Assist Level: Moderate Assistance - Patient 50 - 74%     Upper Body Dressing/Undressing Upper body dressing   What is the patient wearing?: Pull over shirt    Upper body assist Assist Level: Set up assist    Lower Body Dressing/Undressing Lower body dressing      What is the patient wearing?: Pants,Incontinence brief     Lower body assist Assist for lower body dressing: Moderate Assistance - Patient 50 - 74%     Toileting Toileting    Toileting assist Assist for toileting: Minimal Assistance - Patient > 75%     Transfers Chair/bed transfer  Transfers assist     Chair/bed transfer assist level: Minimal Assistance - Patient > 75%     Locomotion Ambulation   Ambulation assist   Ambulation activity did not occur: Safety/medical concerns  Assist level: Minimal Assistance - Patient > 75% Assistive device: Walker-rolling Max distance: 20 ft   Walk 10 feet activity   Assist  Walk 10 feet activity did not occur: Safety/medical concerns  Assist level: Minimal  Assistance - Patient > 75% Assistive device: Walker-rolling   Walk 50 feet activity   Assist Walk 50 feet with 2 turns activity did not occur: Safety/medical concerns         Walk 150 feet activity   Assist Walk 150 feet activity did not occur: Safety/medical concerns         Walk 10 feet on uneven surface  activity   Assist Walk 10 feet on uneven surfaces activity did not occur: Safety/medical concerns         Wheelchair     Assist Will patient use  wheelchair at discharge?: Yes Type of Wheelchair: Manual    Wheelchair assist level: Supervision/Verbal cueing Max wheelchair distance: 55 ft    Wheelchair 50 feet with 2 turns activity    Assist        Assist Level: Supervision/Verbal cueing   Wheelchair 150 feet activity     Assist      Assist Level: Total Assistance - Patient < 25%   Medical Problem List and Plan: 1.  Decreased functional ability secondary to left intertrochanteric femur fracture as well as history of left femur fracture May 2019 status post IM nailing as well as removal of hardware left femur from previous fracture 04/23/2020.  Weightbearing as tolerated.  Hospital course complicated by postoperative small acute infarct right cerebellum, bilateral occipital parietal lobes in the high left precentral gyrus.  Continue CIR  Discussed progress with therapies in accordance with planned discharge date 2.  Antithrombotics: -DVT/anticoagulation: Chronic Coumadin  INR therapeutic on 2/15             -antiplatelet therapy: n/a 3. Pain Management: Oxycodone as needed, Robaxin as needed  See #17  Relatively controlled with meds on 2/15  Scheduled AM oxycodone at 0730 per pt/husband request so that she receives it as she begins to move with therapy 4. Mood: Provide emotional support             -antipsychotic agents: N/A 5. Neuropsych: This patient is ?fully capable of making decisions on her own behalf. 6.  Acute blood loss anemia.    Hemoglobin 8.4 on 2/14 7.  Atrial fibrillation.  Toprol-XL 25 mg daily.  Continue Coumadin.  Cardiac rate controlled  HR controlled on 2/14  Monitor with increased activity 8. Hypertension: Chlorthalidone on hold.    Losartan 25, increased to 50 on 2/9, increased on 2/15  Increased toprol to 37.5mg  daily 9.  CKD stage III.  Baseline creatinine ?1.46.    Creatinine 1.22 on 2/14  Continue to monitor 10.  Hyperlipidemia: Lipitor 11. Recurrent UTIs.  Latest urinalysis study  negative.    Continue Keflex--continue for 7 days (through 2/19) 12. Skin/Wound Care: Routine skin checks 13. Fluids/Electrolytes/Nutrition: Routine in and outs 14. Very hard of hearing- talk to her LEFT ear.  15.  Chronic diarrhea: used Imodium 2 mg BID daily at home  16. Leg length discrepancy- L shorter than Right- husband to bring in appropriate lift shoes.  17. L shoulder bicipital tendinitis based on clinical exam  Voltaren gel 2G QID 18.  Congestion  Flonase ordered  Resolved 19. GERD  Timing adjusted per patient/husband-husband would like medication at 715-discussed limitations in giving medication at an exact time on a consistent basis  Improved  LOS: 14 days A FACE TO FACE EVALUATION WAS PERFORMED  Donivan Thammavong Lorie Phenix 05/13/2020, 9:54 AM

## 2020-05-13 NOTE — Plan of Care (Signed)
  Problem: Consults Goal: RH STROKE PATIENT EDUCATION Description: See Patient Education module for education specifics  Outcome: Progressing   Problem: RH BOWEL ELIMINATION Goal: RH STG MANAGE BOWEL WITH ASSISTANCE Description: STG Manage Bowel with min Assistance. Outcome: Progressing   Problem: RH BLADDER ELIMINATION Goal: RH STG MANAGE BLADDER WITH ASSISTANCE Description: STG Manage Bladder With min Assistance Outcome: Progressing   Problem: RH SKIN INTEGRITY Goal: RH STG MAINTAIN SKIN INTEGRITY WITH ASSISTANCE Description: STG Maintain Skin Integrity With min Assistance. Outcome: Progressing   Problem: RH SAFETY Goal: RH STG ADHERE TO SAFETY PRECAUTIONS W/ASSISTANCE/DEVICE Description: STG Adhere to Safety Precautions With Assistance/Device. Outcome: Progressing   Problem: RH PAIN MANAGEMENT Goal: RH STG PAIN MANAGED AT OR BELOW PT'S PAIN GOAL Outcome: Progressing   Problem: RH KNOWLEDGE DEFICIT Goal: RH STG INCREASE KNOWLEDGE OF HYPERTENSION Outcome: Progressing Goal: RH STG INCREASE KNOWLEGDE OF HYPERLIPIDEMIA Outcome: Progressing Goal: RH STG INCREASE KNOWLEDGE OF STROKE PROPHYLAXIS Outcome: Progressing

## 2020-05-14 LAB — PROTIME-INR
INR: 2.1 — ABNORMAL HIGH (ref 0.8–1.2)
Prothrombin Time: 22.6 seconds — ABNORMAL HIGH (ref 11.4–15.2)

## 2020-05-14 MED ORDER — WARFARIN SODIUM 5 MG PO TABS
5.0000 mg | ORAL_TABLET | Freq: Once | ORAL | Status: AC
  Administered 2020-05-14: 5 mg via ORAL
  Filled 2020-05-14: qty 1

## 2020-05-14 NOTE — Progress Notes (Signed)
Physical Therapy Session Note  Patient Details  Name: [name] MRN: [id number] Date of Birth: 01/04/1934  Today's Date: 05/14/2020 PT Individual Time: 1400-1500 PT Individual Time Calculation (min): 60 min   Short Term Goals: Week 2:  PT Short Term Goal 1 (Week 2): Pt will consistently perform bed mobility with modA PT Short Term Goal 2 (Week 2): Pt will consistently perform sit<>stand transfers with modA and LRAD PT Short Term Goal 3 (Week 2): Pt will perform bed<>chair transfers wtih modA PT Short Term Goal 4 (Week 2): Pt will ambulate 63ft with modA and LRAD  Skilled Therapeutic Interventions/Progress Updates:    Pt received sitting upright in w/c, agreeable to therapy. No reports of pain and also reports she had just recently received pain medications. W/c transport for time management to ortho gym. Stand<>pivot with minA (primarily for sit<>stand) and required CGA for steadying for transfer to mat table. Able to stand with CGA to RW from mat table height and performed standing, unsupported, ball toss with therapist providing minA guard and rehab tech tossing the ball. Patient with posterior bias in unsupported standing with difficulty correcting. Ball toss in multiple planes to challenge dynamic balance and balance strategies. Performed seated there-ex for chest stretches due to her significant kyphotic posture with rounded shoulders. Tightness likely 2/2 chronic scar tissue due to bilateral mastectomy. Provided gentle shldr abd/add and shldr flex/ext with cervical extension and passive scapular retraction. Patient subjectively reports improvement and reports she will complete these outside of therapy to improve postural alignment. She then completed repeated sit<>stands with supervision to RW from mat table (noted reliance of back of legs to assist). When instructed on forward scooting to reduce back of legs to mat table, she required minA for powering to rise. Instructed on lateral stepping  with RW, L<>R ~50ft each direction, completing with CGA. Cues for increasing step length, WBing through L leg, and RW management. Then performed TUG with CGA and RW, scoring 11min and 5 seconds. Time impacted by time to rise from sitting position and turning to sit. TUG > 13.5 seconds indicates increased falls risk. Gait training x62ft with close supervision and RW, gait deficits with step-to gait pattern, antalgic with limited L weight shift, forward flexed trunk and downward gaze. W/c transport remaining distance back to her room and she remained seated in w/c with needs in reach, made comfortable.  Therapy Documentation Precautions:  Precautions Precautions: Fall Restrictions Weight Bearing Restrictions: Yes LUE Weight Bearing: Weight bearing as tolerated LLE Weight Bearing: Weight bearing as tolerated  Therapy/Group: Individual Therapy  Kadon Andrus P Graciela Plato PT 05/14/2020, 7:50 AM

## 2020-05-14 NOTE — Progress Notes (Signed)
Occupational Therapy Session Note  Patient Details  Name: [name] MRN: [id number] Date of Birth: 08-17-1933  Today's Date: 05/14/2020 OT Individual Time: 3403-7096 OT Individual Time Calculation (min): 60 min    Short Term Goals: Week 2:  OT Short Term Goal 1 (Week 2): Pt will complete toilet transfer with a stand pivot with mod A or less on a consistent basis. OT Short Term Goal 2 (Week 2): Pt will be able to ambulate 8 feet to be able to access her bathroom. OT Short Term Goal 3 (Week 2): Pt will be able to stand and pull pants down to prepare for toileting. OT Short Term Goal 4 (Week 2): Pt will be able to pull pants up with mod A.  Skilled Therapeutic Interventions/Progress Updates:    Pt sitting up in w/c, no c/o pain, however wincing noted with any handling of LLE.  Nurse made aware and plan to provide prescribed medication.  Pt ambulated using RW from w/c at bedside to bathroom and toilet transfer completed all with CGA.  Pt needing min cues for safe RW mgt to keep closer to body.  Pt had continent episode of urine.  Pericare completed with CGA and clothing mgt with mod assist (somewhat due to tight fitting clothing and brief mgt).  Pt ambulated a few feet using RW and pivot to tub bench at walk in shower with CGA.  Pt doffed shirt with mod I, and brief and pants and shoes with mod assist. Total assist to doff TED Hose.   Pt bathed UB in sitting with setup.  LB bathing completed at sit/stand level with min assist primarily for sit<>stand transfer using RW for balance.  Pt completed stand pivot to w/c,due to increased fatigue, with min assist.  UB dressing and hair grooming with setup sitting sinkside.  LB dressing completed with mod assist to donn brief and pants and total assist to donn grip socks.  Further training needed in use of reacher to increase independence with LB dressing.  Pt sitting up in w/c, call bell in reach, seat alarm on.  Therapy Documentation Precautions:   Precautions Precautions: Fall Restrictions Weight Bearing Restrictions: Yes LUE Weight Bearing: Weight bearing as tolerated LLE Weight Bearing: Weight bearing as tolerated   Therapy/Group: Individual Therapy  Ezekiel Slocumb 05/14/2020, 12:32 PM

## 2020-05-14 NOTE — Progress Notes (Signed)
Occupational Therapy Session Note  Patient Details  Name: [name] MRN: [id number] Date of Birth: July 25, 1933  Today's Date: 05/14/2020 OT Individual Time: 6381-7711 OT Individual Time Calculation (min): 55 min    Short Term Goals: Week 2:  OT Short Term Goal 1 (Week 2): Pt will complete toilet transfer with a stand pivot with mod A or less on a consistent basis. OT Short Term Goal 2 (Week 2): Pt will be able to ambulate 8 feet to be able to access her bathroom. OT Short Term Goal 3 (Week 2): Pt will be able to stand and pull pants down to prepare for toileting. OT Short Term Goal 4 (Week 2): Pt will be able to pull pants up with mod A.  Skilled Therapeutic Interventions/Progress Updates:    Pt received in wc dressed and ready for therapy.  Pt anxious to leave due to being uncomfortable at night in the hospital, but she does enjoy being here on rehab.   Pt taken to gym via w/c to focus on ambulation with RW.   To prepare, for ambulation pt worked on AROM of LE with alternation knee flex/ext and hip abd/add with feet sliding on towels.  Active heel lifts.   In gym, pt ambulated with min A for 20 feet, rested in arm chair and then able to stand from arm chair with only CGA and then ambulated another 20 feet.   Worked on UE strengthening,  With orange theraband Level 2 focusing on back and tricep strength.  Shoulder AROM.  Pt taken back to room with spouse present.  He confirmed that they have all needed bathroom equipment.   Therapy Documentation Precautions:  Precautions Precautions: Fall Restrictions Weight Bearing Restrictions: Yes LUE Weight Bearing: Weight bearing as tolerated LLE Weight Bearing: Weight bearing as tolerated      Pain:   8/10 L hip - premedicated      Therapy/Group: Individual Therapy  [place] 05/14/2020, 11:20 AM

## 2020-05-14 NOTE — Progress Notes (Signed)
[facility] PHYSICAL MEDICINE & REHABILITATION PROGRESS NOTE  Subjective/Complaints: Patient seen sitting up in a chair this morning.  Husband at bedside.  She states she slept well overnight.  She complains of some reflux due to not being positioned appropriately and not giving her medication in the way that she needs time.  ROS: Denies CP, SOB, N/V/D  Objective: Vital Signs: Blood pressure (!) 163/54, pulse 68, temperature 98.2 F (36.8 C), resp. rate 18, height 5\' 4"  (1.626 m), weight 83.4 kg, SpO2 93 %. No results found. Recent Labs    05/12/20 0513  WBC 5.3  HGB 8.4*  HCT 27.2*  PLT 335   Recent Labs    05/12/20 0513  NA 139  K 4.1  CL 105  CO2 24  GLUCOSE 116*  BUN 24*  CREATININE 1.22*  CALCIUM 8.5*    Intake/Output Summary (Last 24 hours) at 05/14/2020 1220 Last data filed at 05/14/2020 0900 Gross per 24 hour  Intake 780 ml  Output -  Net 780 ml        Physical Exam: BP (!) 163/54 (BP Location: Right Arm)   Pulse 68   Temp 98.2 F (36.8 C)   Resp 18   Ht 5\' 4"  (1.626 m)   Wt 83.4 kg   SpO2 93%   BMI 31.56 kg/m   Constitutional: No distress . Vital signs reviewed. HENT: Normocephalic.  Atraumatic. Eyes: EOMI. No discharge. Cardiovascular: No JVD.  RRR. Respiratory: Normal effort.  No stridor.  Bilateral clear to auscultation. GI: Non-distended.  BS +. Skin: Warm and dry.  Intact. Psych: Normal mood.  Normal behavior. Musc: Left hip with improving edema and tenderness Neuro: Alert Motor: B/l UE: 4+/5 proximal distal Right lower extremity: 4-4+/5 proximal to distal, some pain inhibition Left extremity: Hip flexion 3/5, knee extension 4-/5, ankle dorsiflexion 4-/5 with pain inhibition HOH Right facial weakness, stable  Assessment/Plan: 1. Functional deficits which require 3+ hours per day of interdisciplinary therapy in a comprehensive inpatient rehab setting.  Physiatrist is providing close team supervision and 24 hour management of active  medical problems listed below.  Physiatrist and rehab team continue to assess barriers to discharge/monitor patient progress toward functional and medical goals   Care Tool:  Bathing    Body parts bathed by patient: Right arm,Left arm,Chest,Abdomen,Right upper leg,Left upper leg,Face,Front perineal area   Body parts bathed by helper: Buttocks,Right lower leg,Left lower leg     Bathing assist Assist Level: Moderate Assistance - Patient 50 - 74%     Upper Body Dressing/Undressing Upper body dressing   What is the patient wearing?: Pull over shirt    Upper body assist Assist Level: Set up assist    Lower Body Dressing/Undressing Lower body dressing      What is the patient wearing?: Pants,Incontinence brief     Lower body assist Assist for lower body dressing: Moderate Assistance - Patient 50 - 74%     Toileting Toileting    Toileting assist Assist for toileting: Minimal Assistance - Patient > 75%     Transfers Chair/bed transfer  Transfers assist     Chair/bed transfer assist level: Minimal Assistance - Patient > 75%     Locomotion Ambulation   Ambulation assist   Ambulation activity did not occur: Safety/medical concerns  Assist level: Minimal Assistance - Patient > 75% Assistive device: Walker-rolling Max distance: 20' 2x   Walk 10 feet activity   Assist  Walk 10 feet activity did not occur: Safety/medical concerns  Assist  level: Moderate Assistance - Patient - 50 - 74% Assistive device: Walker-rolling   Walk 50 feet activity   Assist Walk 50 feet with 2 turns activity did not occur: Safety/medical concerns         Walk 150 feet activity   Assist Walk 150 feet activity did not occur: Safety/medical concerns         Walk 10 feet on uneven surface  activity   Assist Walk 10 feet on uneven surfaces activity did not occur: Safety/medical concerns         Wheelchair     Assist Will patient use wheelchair at discharge?:  Yes Type of Wheelchair: Manual    Wheelchair assist level: Supervision/Verbal cueing Max wheelchair distance: 55 ft    Wheelchair 50 feet with 2 turns activity    Assist        Assist Level: Supervision/Verbal cueing   Wheelchair 150 feet activity     Assist      Assist Level: Total Assistance - Patient < 25%   Medical Problem List and Plan: 1.  Decreased functional ability secondary to left intertrochanteric femur fracture as well as history of left femur fracture May 2019 status post IM nailing as well as removal of hardware left femur from previous fracture 04/23/2020.  Weightbearing as tolerated.  Hospital course complicated by postoperative small acute infarct right cerebellum, bilateral occipital parietal lobes in the high left precentral gyrus.  Continue CIR  Discussed progress with therapies in accordance with planned discharge date 2.  Antithrombotics: -DVT/anticoagulation: Chronic Coumadin  INR therapeutic on 2/16             -antiplatelet therapy: n/a 3. Pain Management: Oxycodone as needed, Robaxin as needed  See #17  Relatively controlled with meds on 2/16  Scheduled AM oxycodone at 0730 per pt/husband request so that she receives it as she begins to move with therapy 4. Mood: Provide emotional support             -antipsychotic agents: N/A 5. Neuropsych: This patient is ?fully capable of making decisions on her own behalf. 6.  Acute blood loss anemia.    Hemoglobin 8.4 on 2/14 7.  Atrial fibrillation.  Toprol-XL 25 mg daily.  Continue Coumadin.  Cardiac rate controlled  HR controlled on 2/16  Monitor with increased activity 8. Hypertension: Chlorthalidone on hold.    Losartan 25, increased to 50 on 2/9, increased on 2/15  Increased toprol to 37.5mg  daily  Elevated on 2/16 9.  CKD stage III.  Baseline creatinine ?1.46.    Creatinine 1.22 on 2/14  Continue to monitor 10.  Hyperlipidemia: Lipitor 11. Recurrent UTIs.  Latest urinalysis study  negative.    Continue Keflex--continue for 7 days (through 2/19) 12. Skin/Wound Care: Routine skin checks 13. Fluids/Electrolytes/Nutrition: Routine in and outs 14. Very hard of hearing- talk to her LEFT ear.  15.  Chronic diarrhea: used Imodium 2 mg BID daily at home  16. Leg length discrepancy- L shorter than Right- husband to bring in appropriate lift shoes.  17. L shoulder bicipital tendinitis based on clinical exam  Voltaren gel 2G QID 18.  Congestion  Flonase ordered  Resolved 19. GERD  Timing adjusted per patient/husband-husband would like medication at 715-discussed limitations in giving medication at an exact time on a consistent basis  Improved overall  LOS: 15 days A FACE TO FACE EVALUATION WAS PERFORMED  Ankit Lorie Phenix 05/14/2020, 12:20 PM

## 2020-05-14 NOTE — Patient Care Conference (Signed)
Inpatient RehabilitationTeam Conference and Plan of Care Update Date: 05/14/2020   Time: 11:19 AM    Patient Name: [name]      Medical Record Number: [id number]  Date of Birth: [birth year] Sex: Female         Room/Bed: 4W23C/4W23C-01 Payor Info: Payor: MEDICARE / Plan: MEDICARE PART A AND B / Product Type: *No Product type* /    Admit Date/Time:  04/29/2020  4:16 PM  Primary Diagnosis:  Intertrochanteric fracture of left hip [facility])  Hospital Problems: Principal Problem:   Intertrochanteric fracture of left hip (HCC) Active Problems:   Pressure injury of skin   Congestion of nasal sinus   Stage 3 chronic kidney disease (HCC)   Benign essential HTN   Labile blood pressure   Anemia, chronic disease   Essential hypertension   PAF (paroxysmal atrial fibrillation) ([place])   Post-operative pain   Gastroesophageal reflux disease   Recurrent UTI    Expected Discharge Date: Expected Discharge Date: 05/22/20  Team Members Present: Physician leading conference: Dr. Delice Lesch Care Coodinator Present: Dorien Chihuahua, RN, BSN, CRRN;Becky Dupree, LCSW Nurse Present: Other (comment) Levie Heritage, RN) PT Present: Ginnie Smart, PT OT Present: Meriel Pica, OT SLP Present: Nadara Mode, SLP PPS Coordinator present : Gunnar Fusi, SLP     Current Status/Progress Goal Weekly Team Focus  Bowel/Bladder   Continent/incontinent of bladder. Continent of bowel. BM- 05/13/2020.  Maintain continence.  Toilet patient every round as needed.   Swallow/Nutrition/ Hydration             ADL's   CGA/min A with increased time functional transfers using RW, setup UB ADLs, min/mod A LB ADLs  upgrade LTGs to CGA-supervision due to pt making steady progress  functional transfer training, ADL training, standing balance, endurance training   Mobility   supervision sitting balance, minA sit<>stand to RW, CGA gait up to 61ft.  Upgraded to CGA/minA overall  LLE pain management, LLE knee/hip ROM,  functional transfers, gait training, DC planning   Communication             Safety/Cognition/ Behavioral Observations            Pain   Left hip pain at 3-8/10. Oxycodone with Robaxin sometimes effective.  Pain less than or equal to 3.  Assess pain q shift.   Skin   Left hip surgical wound c/d/i with glue and well approximated.  Maintain skin integrity.  Assess skin q shift.     Discharge Planning:  Husband now planning on taking pt home and will provide assist, will need extensive family educiton-unsure if husband realizes the amount care pt will be.   Team Discussion: UTI treated, continent of bowel and bladder. Pain managed with prm medications. Patient on target to meet rehab goals: yes, currently progressing towards goals. Functional improvements are slow and steady. Note  Increased ROM in right hip/knee.  *See Care Plan and progress notes for long and short-term goals.   Revisions to Treatment Plan:  Upgraded PT goals to min assist  Teaching Needs: Transfers, toileting, medications, etc.  Current Barriers to Discharge: Decreased caregiver support and Home enviroment access/layout  Possible Resolutions to Barriers: Family education     Medical Summary Current Status: Decreased functional ability secondary to left intertrochanteric femur fracture as well as history of left femur fracture May 2019 status post IM nailing as well as removal of hardware left femur from previous fracture 04/23/2020.  Weightbearing as tolerated.  Hospital course complicated by postoperative small  acute infarct right cerebellum, bilateral occipital parietal lobes in the high left precentral gyrus.  Barriers to Discharge: Medical stability;Decreased family/caregiver support;Weight;Behavior;Home enviroment access/layout   Possible Resolutions to [employer]: Therapies, optimize BP meds, follow labs - Cr, Hb, abx for UTI, adjusted timing of GERD meds   Continued Need for Acute  Rehabilitation Level of Care: The patient requires daily medical management by a physician with specialized training in physical medicine and rehabilitation for the following reasons: Direction of a multidisciplinary physical rehabilitation program to maximize functional independence : Yes Medical management of patient stability for increased activity during participation in an intensive rehabilitation regime.: Yes Analysis of laboratory values and/or radiology reports with any subsequent need for medication adjustment and/or medical intervention. : Yes   I attest that I was present, lead the team conference, and concur with the assessment and plan of the team.   Dorien Chihuahua B 05/14/2020, 1:48 PM

## 2020-05-14 NOTE — Plan of Care (Signed)
  Problem: Consults Goal: RH STROKE PATIENT EDUCATION Description: See Patient Education module for education specifics  Outcome: Progressing   Problem: RH BOWEL ELIMINATION Goal: RH STG MANAGE BOWEL WITH ASSISTANCE Description: STG Manage Bowel with min Assistance. Outcome: Progressing   Problem: RH BLADDER ELIMINATION Goal: RH STG MANAGE BLADDER WITH ASSISTANCE Description: STG Manage Bladder With min Assistance Outcome: Progressing   Problem: RH SKIN INTEGRITY Goal: RH STG MAINTAIN SKIN INTEGRITY WITH ASSISTANCE Description: STG Maintain Skin Integrity With min Assistance. Outcome: Progressing   Problem: RH SAFETY Goal: RH STG ADHERE TO SAFETY PRECAUTIONS W/ASSISTANCE/DEVICE Description: STG Adhere to Safety Precautions With min Assistance/Device. Outcome: Progressing   Problem: RH PAIN MANAGEMENT Goal: RH STG PAIN MANAGED AT OR BELOW PT'S PAIN GOAL Outcome: Progressing   Problem: RH KNOWLEDGE DEFICIT Goal: RH STG INCREASE KNOWLEDGE OF HYPERTENSION Description: Patient and spouse will be able to manage HTN with medications and dietary modifications using handouts and educational materials with cues/reminders Outcome: Progressing Goal: RH STG INCREASE KNOWLEGDE OF HYPERLIPIDEMIA Description: Patient and spouse will be able to manage HLD  with medications and dietary modifications using handouts and educational materials with cues/reminders Outcome: Progressing Goal: RH STG INCREASE KNOWLEDGE OF STROKE PROPHYLAXIS Description: Patient and spouse will be able to manage secondary stroke risks/prophylaxis with medications and dietary modifications using handouts and educational materials with cues/reminders Outcome: Progressing   Problem: Consults Goal: RH STROKE PATIENT EDUCATION Description: See Patient Education module for education specifics  Outcome: Progressing

## 2020-05-14 NOTE — Progress Notes (Signed)
Patient ID: [name], female   DOB: 06/09/1933, 87 y.o.   MRN: [id number]  Met with pt husband is out playing golf, to update team conference and progress. Pt has made good progress this week and will reach CGA level 2/24. Extended discharge back to 2/24 to reach her goals and go home. Husband voiced he will be able to provide the care she needs. Have asked team to have him participate when here. Husband seems to be distracted at times and relies on his old routines and what he wants to do ie play golf. Aware pt will require 24/7 care at discharge.

## 2020-05-14 NOTE — Progress Notes (Signed)
ANTICOAGULATION CONSULT NOTE - Follow Up Consult  Pharmacy Consult for Warfarin Indication: atrial fibrillation  Allergies  Allergen Reactions  . Pradaxa [Dabigatran Etexilate Mesylate] Other (See Comments)    INTERNAL BLEEDING  . Clindamycin/Lincomycin Other (See Comments)    PT STATES HER DOCTOR TOLD HER NOT TO TAKE CLINDAMYCIN BECAUSE SHE GOT C-DIFF AFTER TAKING AMPICILLIN- "tolerated in 2022" (per spouse)  . Latex Other (See Comments)    Blisters   . Ampicillin Other (See Comments)    C. Diff after taking ampicillin - "tolerated in 2022" (per husband) Pt has received cephalosporins in 2013, 2014, 2017, 2019, 2022   . Sulfamethoxazole Diarrhea    Patient Measurements: Height: 5\' 4"  (162.6 cm) Weight: 83.4 kg (183 lb 13.8 oz) IBW/kg (Calculated) : 54.7  Vital Signs: Temp: 98.2 F (36.8 C) (02/16 0531) BP: 163/54 (02/16 0531) Pulse Rate: 68 (02/16 0531)  Labs: Recent Labs    05/12/20 0513 05/13/20 0456 05/14/20 0508  HGB 8.4*  --   --   HCT 27.2*  --   --   PLT 335  --   --   LABPROT 22.7* 21.6* 22.6*  INR 2.1* 2.0* 2.1*  CREATININE 1.22*  --   --     Estimated Creatinine Clearance: 35.9 mL/min (A) (by C-G formula based on SCr of 1.22 mg/dL (H)).  Assessment: 87 year old with PMH significant for PAF on warfarin, recurrent UTI, HTN, HLD admitted to [facility] 04/22/2020 with acute closed left intratrochanteric hip fracture.Patient developed worsening dysphagia, MRI was obtain which showed acute stroke. Warfarin held per neurology recommendation. Pharmacy consulted to restart Warfarin on 1/31.  INR was 2.3 upon admit. PTA warfarin dose is 5 mg daily EXCEPT for 7.5 mg on Tues/Thurs.  INR is therapeutic at 2.1 today, therefore will give PTA dose of warfarin tonight to prevent patient from dropping subtherapeutic. The patient also started cephalexin, will monitor INR closely due to interaction. Last Hgb low at 8.4, platelets 335.  Goal of Therapy:  INR 2-3 Monitor  platelets by anticoagulation protocol: Yes   Plan:  Give warfarin 5 mg PO tonight Daily PT/INR and weekly CBC qMon Monitor for signs and symptoms of bleeding  Rama Sorci A. Levada Dy, PharmD, BCPS, FNKF Clinical Pharmacist [place] Please utilize Amion for appropriate phone number to reach the unit pharmacist ([place])

## 2020-05-15 LAB — PROTIME-INR
INR: 2.5 — ABNORMAL HIGH (ref 0.8–1.2)
Prothrombin Time: 26.4 seconds — ABNORMAL HIGH (ref 11.4–15.2)

## 2020-05-15 MED ORDER — WARFARIN SODIUM 5 MG PO TABS
5.0000 mg | ORAL_TABLET | Freq: Once | ORAL | Status: AC
  Administered 2020-05-15: 5 mg via ORAL
  Filled 2020-05-15: qty 1

## 2020-05-15 NOTE — Progress Notes (Signed)
Occupational Therapy Session Note  Patient Details  Name: [name] MRN: [id number] Date of Birth: [birth year]  Today's Date: 05/15/2020 OT Individual Time: 8588-5027 OT Individual Time Calculation (min): 15 min    Short Term Goals: Week 1:  OT Short Term Goal 1 (Week 1): Pt will be able to rise to stand with RW with max A of 1 to prepare for toileting. OT Short Term Goal 1 - Progress (Week 1): Met OT Short Term Goal 2 (Week 1): Pt will be able to stand with RW and release 1 hand for clothing management with max A. OT Short Term Goal 2 - Progress (Week 1): Met OT Short Term Goal 3 (Week 1): Pt will be able to stand pivot with RW with max A of 1. OT Short Term Goal 3 - Progress (Week 1): Met OT Short Term Goal 4 (Week 1): Pt will demonstrate improved processing by donning shirt with min cues. OT Short Term Goal 4 - Progress (Week 1): Met  Skilled Therapeutic Interventions/Progress Updates:    1:1. Pt received in bed agreeable only to getting to the recliner, "nothing more, ive had too much therapy." pt completes short distance ambulation in room with RW and VC for hand placement during sit to stand with MIN A and MIN A for weght bearing on LLE advancing RLE with cuing to push into RW with arms. Once seated in recliner pt declines any UB exercise/tx. Exited session with pt seated in recliner, exit alarm on and call light in reach   Therapy Documentation Precautions:  Precautions Precautions: Fall Restrictions Weight Bearing Restrictions: Yes LUE Weight Bearing: Weight bearing as tolerated LLE Weight Bearing: Weight bearing as tolerated General:   Vital Signs: Therapy Vitals Temp: 98.3 F (36.8 C) Temp Source: Oral Pulse Rate: 62 Resp: 18 BP: (!) 168/48 Patient Position (if appropriate): Lying Oxygen Therapy SpO2: 95 % O2 Device: Room Air Pain:   ADL: ADL Eating: Set up Grooming: Setup Upper Body Bathing: Supervision/safety Where Assessed-Upper Body Bathing:  Shower Lower Body Bathing: Moderate assistance Where Assessed-Lower Body Bathing: Shower Upper Body Dressing: Supervision/safety Where Assessed-Upper Body Dressing: Wheelchair Lower Body Dressing: Maximal assistance Where Assessed-Lower Body Dressing: Wheelchair Toileting: Maximal assistance Where Assessed-Toileting: Glass blower/designer: Maximal Print production planner Method: Arts development officer: Energy manager Method:  (used stedy) Youth worker: Nurse, learning disability    Praxis   Exercises:   Other Treatments:     Therapy/Group: Individual Therapy  Tonny Branch 05/15/2020, 6:59 AM

## 2020-05-15 NOTE — Progress Notes (Addendum)
Patient ID: [name], female   DOB: 1933-09-09, 87 y.o.   MRN: [id number]  Husband will get 3 in 1 on own due to not covered by insurance since received one in the past 5 years. Now husband wants to see if covered. Have ordered via [employer] for delivery to room

## 2020-05-15 NOTE — Progress Notes (Signed)
Physical Therapy Weekly Progress Note  Patient Details  Name: [name] MRN: [id number] Date of Birth: 1934/03/08  Beginning of progress report period: May 07, 2020 End of progress report period: May 15, 2020  Today's Date: 05/15/2020 PT Individual Time: 0931-1216 PT Individual Time Calculation (min): 40 min   Patient has met 4 of 4 short term goals. Pt making appropriate progress towards long term goals. She's shown improved activity tolerance, decreased pain inhibition with L leg, improved weightbearing through L leg, and continued motivation to progress towards indep. Bed mobility goal has been DC'd as patient sleeps in a lift chair at baseline. She is able to complete sit<>stand transfers with minA to RW, requires minA for bed<>chair transfers with RW, and can ambulate up to 25-48f with CGA/minA and RW. Gait distances limited by fatigue and L leg pain. Gait speed significantly decreased, TUG performed yesterday at 417m5seconds.   Patient continues to demonstrate the following deficits muscle weakness and muscle joint tightness, decreased cardiorespiratoy endurance and decreased standing balance and decreased balance strategies and therefore will continue to benefit from skilled PT intervention to increase functional independence with mobility.  Patient progressing toward long term goals. Continue plan of care.  PT Short Term Goals Week 3:  PT Short Term Goal 1 (Week 3): STG = LTG due to ELOS  Skilled Therapeutic Interventions/Progress Updates:     Pt received sitting upright in w/c, awake and agreaeble to therapy. No reports of pain but also reports she received pain medications recently by nursing staff. W/c transport for time management to ortho gym. Completed seated LAQ and hip marches in w/c for "warm up" prior to initiating gait training. Then completed gait training, 2x3558fseated rest) with CGA/minA and RW. Gait speed significantly reduced and 1st trial of gait she  demo's step-to gait pattern. Reinforced step-through gait during 2nd trial and she required minA for RW management to complete this. She's showing improved weightbearing through L leg and gait distances limited primarily by fatigue and L leg weakness. Performed stand<>pivot transfer with minA and RW to Nustep. Required maxA for positioning L leg into saddle however patient was unable to adequately position her R foot once L foot was positioned. Aborted attempting Nustep due to L pain and inability to position correctly. Stand<>pivot with minA and RW back to her w/c and she was transported back to her room. She remained seated in w/c with husband at bedside, needs within reach.  Therapy Documentation Precautions:  Precautions Precautions: Fall Restrictions Weight Bearing Restrictions: Yes LUE Weight Bearing: Weight bearing as tolerated LLE Weight Bearing: Weight bearing as tolerated General:    Therapy/Group: Individual Therapy  [name] [name] PT 05/15/2020, 7:44 AM

## 2020-05-15 NOTE — Plan of Care (Signed)
  Problem: Consults Goal: RH STROKE PATIENT EDUCATION Description: See Patient Education module for education specifics  Outcome: Progressing   Problem: RH BOWEL ELIMINATION Goal: RH STG MANAGE BOWEL WITH ASSISTANCE Description: STG Manage Bowel with min Assistance. Outcome: Progressing   Problem: RH BLADDER ELIMINATION Goal: RH STG MANAGE BLADDER WITH ASSISTANCE Description: STG Manage Bladder With min Assistance Outcome: Progressing   Problem: RH SKIN INTEGRITY Goal: RH STG MAINTAIN SKIN INTEGRITY WITH ASSISTANCE Description: STG Maintain Skin Integrity With min Assistance. Outcome: Progressing   Problem: RH SAFETY Goal: RH STG ADHERE TO SAFETY PRECAUTIONS W/ASSISTANCE/DEVICE Description: STG Adhere to Safety Precautions With min Assistance/Device. Outcome: Progressing   Problem: RH PAIN MANAGEMENT Goal: RH STG PAIN MANAGED AT OR BELOW PT'S PAIN GOAL Outcome: Progressing   Problem: RH KNOWLEDGE DEFICIT Goal: RH STG INCREASE KNOWLEDGE OF HYPERTENSION Description: Patient and spouse will be able to manage HTN with medications and dietary modifications using handouts and educational materials with cues/reminders Outcome: Progressing Goal: RH STG INCREASE KNOWLEGDE OF HYPERLIPIDEMIA Description: Patient and spouse will be able to manage HLD  with medications and dietary modifications using handouts and educational materials with cues/reminders Outcome: Progressing Goal: RH STG INCREASE KNOWLEDGE OF STROKE PROPHYLAXIS Description: Patient and spouse will be able to manage secondary stroke risks/prophylaxis with medications and dietary modifications using handouts and educational materials with cues/reminders Outcome: Progressing   Problem: Consults Goal: RH STROKE PATIENT EDUCATION Description: See Patient Education module for education specifics  Outcome: Progressing

## 2020-05-15 NOTE — Progress Notes (Signed)
[facility] PHYSICAL MEDICINE & REHABILITATION PROGRESS NOTE  Subjective/Complaints: Patient seen sitting up in her chair this morning.  She states she slept fairly overnight.  Husband sitting at bedside wants no patient can receive Benadryl for sleep-discussed with husband again.  ROS: Denies CP, SOB, N/V/D  Objective: Vital Signs: Blood pressure (!) 145/55, pulse 60, temperature 98.4 F (36.9 C), temperature source Oral, resp. rate 16, height 5\' 4"  (1.626 m), weight 83.9 kg, SpO2 97 %. No results found. No results for input(s): WBC, HGB, HCT, PLT in the last 72 hours. No results for input(s): NA, K, CL, CO2, GLUCOSE, BUN, CREATININE, CALCIUM in the last 72 hours.  Intake/Output Summary (Last 24 hours) at 05/15/2020 1628 Last data filed at 05/15/2020 1318 Gross per 24 hour  Intake 420 ml  Output --  Net 420 ml        Physical Exam: BP (!) 145/55 (BP Location: Right Arm)   Pulse 60   Temp 98.4 F (36.9 C) (Oral)   Resp 16   Ht 5\' 4"  (1.626 m)   Wt 83.9 kg   SpO2 97%   BMI 31.75 kg/m   Constitutional: No distress . Vital signs reviewed. HENT: Normocephalic.  Atraumatic. Eyes: EOMI. No discharge. Cardiovascular: No JVD.  RRR. Respiratory: Normal effort.  No stridor.  Bilateral clear to auscultation. GI: Non-distended.  BS +. Skin: Warm and dry.  Intact. Psych: Normal mood.  Normal behavior. Musc: Left hip with edema and tenderness, stable Neuro: Alert Motor: B/l UE: 4+/5 proximal distal Right lower extremity: 4-4+/5 proximal to distal, some pain inhibition, stable Left extremity: Hip flexion 3-/5, knee extension 4-/5, ankle dorsiflexion 4-/5 with pain inhibition HOH Right facial weakness, stable  Assessment/Plan: 1. Functional deficits which require 3+ hours per day of interdisciplinary therapy in a comprehensive inpatient rehab setting.  Physiatrist is providing close team supervision and 24 hour management of active medical problems listed below.  Physiatrist  and rehab team continue to assess barriers to discharge/monitor patient progress toward functional and medical goals   Care Tool:  Bathing    Body parts bathed by patient: Right arm,Left arm,Chest,Abdomen,Right upper leg,Left upper leg,Face,Front perineal area,Left lower leg,Right lower leg,Buttocks   Body parts bathed by helper: Buttocks,Right lower leg,Left lower leg     Bathing assist Assist Level: Minimal Assistance - Patient > 75%     Upper Body Dressing/Undressing Upper body dressing   What is the patient wearing?: Pull over shirt    Upper body assist Assist Level: Set up assist    Lower Body Dressing/Undressing Lower body dressing      What is the patient wearing?: Pants,Incontinence brief     Lower body assist Assist for lower body dressing: Moderate Assistance - Patient 50 - 74%     Toileting Toileting    Toileting assist Assist for toileting: Minimal Assistance - Patient > 75%     Transfers Chair/bed transfer  Transfers assist     Chair/bed transfer assist level: Minimal Assistance - Patient > 75%     Locomotion Ambulation   Ambulation assist   Ambulation activity did not occur: Safety/medical concerns  Assist level: Minimal Assistance - Patient > 75% Assistive device: Walker-rolling Max distance: 30   Walk 10 feet activity   Assist  Walk 10 feet activity did not occur: Safety/medical concerns  Assist level: Minimal Assistance - Patient > 75% Assistive device: Walker-rolling   Walk 50 feet activity   Assist Walk 50 feet with 2 turns activity did not occur:  Safety/medical concerns         Walk 150 feet activity   Assist Walk 150 feet activity did not occur: Safety/medical concerns         Walk 10 feet on uneven surface  activity   Assist Walk 10 feet on uneven surfaces activity did not occur: Safety/medical concerns         Wheelchair     Assist Will patient use wheelchair at discharge?: Yes Type of  Wheelchair: Manual    Wheelchair assist level: Supervision/Verbal cueing Max wheelchair distance: 55 ft    Wheelchair 50 feet with 2 turns activity    Assist        Assist Level: Supervision/Verbal cueing   Wheelchair 150 feet activity     Assist      Assist Level: Total Assistance - Patient < 25%   Medical Problem List and Plan: 1.  Decreased functional ability secondary to left intertrochanteric femur fracture as well as history of left femur fracture May 2019 status post IM nailing as well as removal of hardware left femur from previous fracture 04/23/2020.  Weightbearing as tolerated.  Hospital course complicated by postoperative small acute infarct right cerebellum, bilateral occipital parietal lobes in the high left precentral gyrus.  Continue CIR  Discussed progress with therapies in accordance with planned discharge date 2.  Antithrombotics: -DVT/anticoagulation: Chronic Coumadin  INR therapeutic on 2/17             -antiplatelet therapy: n/a 3. Pain Management: Oxycodone as needed, Robaxin as needed  See #17  Relatively controlled with meds on 2/17  Scheduled AM oxycodone at 0730 per pt/husband request so that she receives it as she begins to move with therapy 4. Mood: Provide emotional support             -antipsychotic agents: N/A 5. Neuropsych: This patient is ?fully capable of making decisions on her own behalf. 6.  Acute blood loss anemia.    Hemoglobin 8.4 on 2/14 7.  Atrial fibrillation.  Toprol-XL 25 mg daily.  Continue Coumadin.  Cardiac rate controlled  HR controlled on 2/17  Monitor with increased activity 8. Hypertension: Chlorthalidone on hold.    Losartan 25, increased to 50 on 2/9, increased on 2/15  Increased toprol to 37.5mg  daily  Elevated on 2/17 9.  CKD stage III.  Baseline creatinine ?1.46.    Creatinine 1.22 on 2/14, labs ordered for tomorrow  Continue to monitor 10.  Hyperlipidemia: Lipitor 11. Recurrent UTIs.  Latest urinalysis  study negative.    Continue Keflex--continue for 7 days (through 2/19) 12. Skin/Wound Care: Routine skin checks 13. Fluids/Electrolytes/Nutrition: Routine in and outs 14. Very hard of hearing- talk to her LEFT ear.  15.  Chronic diarrhea: used Imodium 2 mg BID daily at home  16. Leg length discrepancy- L shorter than Right- husband to bring in appropriate lift shoes.  17. L shoulder bicipital tendinitis based on clinical exam  Voltaren gel 2G QID 18.  Congestion  Flonase ordered  Resolved 19. GERD  Timing adjusted per patient/husband-husband would like medication at 715-discussed limitations in giving medication at an exact time on a consistent basis  Improved overall  LOS: 16 days A FACE TO FACE EVALUATION WAS PERFORMED  Yuval Nolet Lorie Phenix 05/15/2020, 4:28 PM

## 2020-05-15 NOTE — Progress Notes (Signed)
Occupational Therapy Session Note  Patient Details  Name: [name] MRN: [id number] Date of Birth: 1934-03-27  Today's Date: 05/15/2020 OT Individual Time: 1001-1054 OT Individual Time Calculation (min): 53 min    Short Term Goals: Week 2:  OT Short Term Goal 1 (Week 2): Pt will complete toilet transfer with a stand pivot with mod A or less on a consistent basis. OT Short Term Goal 2 (Week 2): Pt will be able to ambulate 8 feet to be able to access her bathroom. OT Short Term Goal 3 (Week 2): Pt will be able to stand and pull pants down to prepare for toileting. OT Short Term Goal 4 (Week 2): Pt will be able to pull pants up with mod A.  Skilled Therapeutic Interventions/Progress Updates:    Pt sitting up in w/c, husband present and agreeable to caregiver education during this OT session. Pt reports feeling sleepy, mild pain in LLE. Utilized rest breaks as needed to address pain.  Pt ambulated to bathroom with husband providing CGA. Clothing mgt completed with min assist, pt had continent episode of urine, pericare completed with mod I.  CGA for toilet transfer sit<>stand at [place] ( assist provided by husband with OT present to supervise and assess safety).    Pt ambulated to sink with CGA and min VCs for safe RW mgt.  While pt was standing at sink to wash hands (requiring close supervision), husband left pt and went to bathroom without warning (and closed door behind him), therefore OT provided close supervision to pt until husband returned.  Pt returned to w/c with CGA, with OT needing to instruct pts husband to initiate providing needed assist to pt.    Educated pts husband regarding importance of providing CGA to close supervision at all times that pt is in standing or completing functional transfers.  Recommended, if he needs to use bathroom urgently, to ensure pt is in safe seated position before leaving her side.  Pts husband reports he thought it would be okay to leave her standing at the  sink since she had the sink to hold onto for balance; however after OT provided education, pts husand reports understanding.    Transported pt via w/c to ADL suite to train pt and husband on safe step over walk in shower threshold transfer.  Pt completed using RW with CGA provided by husband and min intermittent VCs provided by OT to ensure safe technique.  Pt returned to room via w/c, call bell in reach, seat belt alarm on.    Therapy Documentation Precautions:  Precautions Precautions: Fall Restrictions Weight Bearing Restrictions: Yes LUE Weight Bearing: Weight bearing as tolerated LLE Weight Bearing: Weight bearing as tolerated   Therapy/Group: Individual Therapy  Ezekiel Slocumb 05/15/2020, 1:46 PM

## 2020-05-15 NOTE — Progress Notes (Signed)
Physical Therapy Session Note  Patient Details  Name: [name] MRN: [id number] Date of Birth: 1934/01/26  Today's Date: 05/15/2020 PT Individual Time: 1100-1130 PT Individual Time Calculation (min): 30 min   Short Term Goals: Week 1:  PT Short Term Goal 1 (Week 1): Pt will complete bed mobility with maxA PT Short Term Goal 2 (Week 1): Pt will complete bed<>chair transfers with maxA and LRAD PT Short Term Goal 3 (Week 1): Pt will initiate gait training with LRAD Week 2:  PT Short Term Goal 1 (Week 2): Pt will consistently perform bed mobility with modA PT Short Term Goal 2 (Week 2): Pt will consistently perform sit<>stand transfers with modA and LRAD PT Short Term Goal 3 (Week 2): Pt will perform bed<>chair transfers wtih modA PT Short Term Goal 4 (Week 2): Pt will ambulate 82ft with modA and LRAD Week 3:  PT Short Term Goal 1 (Week 3): STG = LTG due to ELOS  Skilled Therapeutic Interventions/Progress Updates:    PAIN  7/10 LLE w/activity.  Activity to tolerance.  Rest breaks given as needed.   Pt initially oob in wc and agreeable to session.  Transported to Day room. Scoots to edge of wc and Sit to stand w/cga, very slow transition to RW, cues to improve upright posture. Gait x  30 Ft w/cues for posture, step to gait,very slow cadence, frequent cues for posture, min assist for safety  Standing balance:  Standing reaching in all quadrants w/alternating use of UEs, single UE support on RW, unable to reach beyond armslength in any direction.  Tolerated 2 min standing balance activity.  At end of 30 min session, pt transported to room.  Pt left oob in wc w/alarm belt set and needs in reach     Therapy Documentation Precautions:  Precautions Precautions: Fall Restrictions Weight Bearing Restrictions: Yes LUE Weight Bearing: Weight bearing as tolerated LLE Weight Bearing: Weight bearing as tolerated    Therapy/Group: Individual Therapy  Callie Fielding, [place] 05/15/2020, 12:28 PM

## 2020-05-15 NOTE — Progress Notes (Signed)
ANTICOAGULATION CONSULT NOTE - Follow Up Consult  Pharmacy Consult for Warfarin Indication: atrial fibrillation  Allergies  Allergen Reactions  . Pradaxa [Dabigatran Etexilate Mesylate] Other (See Comments)    INTERNAL BLEEDING  . Clindamycin/Lincomycin Other (See Comments)    PT STATES HER DOCTOR TOLD HER NOT TO TAKE CLINDAMYCIN BECAUSE SHE GOT C-DIFF AFTER TAKING AMPICILLIN- "tolerated in 2022" (per spouse)  . Latex Other (See Comments)    Blisters   . Ampicillin Other (See Comments)    C. Diff after taking ampicillin - "tolerated in 2022" (per husband) Pt has received cephalosporins in 2013, 2014, 2017, 2019, 2022   . Sulfamethoxazole Diarrhea    Patient Measurements: Height: 5\' 4"  (162.6 cm) Weight: 83.9 kg (184 lb 15.5 oz) IBW/kg (Calculated) : 54.7  Vital Signs: Temp: 98.3 F (36.8 C) (02/17 0301) Temp Source: Oral (02/17 0301) BP: 168/48 (02/17 0301) Pulse Rate: 62 (02/17 0301)  Labs: Recent Labs    05/13/20 0456 05/14/20 0508 05/15/20 0609  LABPROT 21.6* 22.6* 26.4*  INR 2.0* 2.1* 2.5*    Estimated Creatinine Clearance: 36 mL/min (A) (by C-G formula based on SCr of 1.22 mg/dL (H)).  Assessment: 87 year old with PMH significant for PAF on warfarin, recurrent UTI, HTN, HLD admitted to [facility] 04/22/2020 with acute closed left intratrochanteric hip fracture.Patient developed worsening dysphagia, MRI was obtain which showed acute stroke. Warfarin held per neurology recommendation. Pharmacy consulted to restart Warfarin on 1/31.  INR was 2.3 upon admit. PTA warfarin dose is 5 mg daily EXCEPT for 7.5 mg on Tues/Thurs.  INR is therapeutic at 2.1>>2.5 today, therefore will give lower dose of warfarin tonight to prevent patient from increasing too rapidly. The patient also started cephalexin, will monitor INR closely due to interaction. Last Hgb low at 8.4, platelets 335.  Goal of Therapy:  INR 2-3 Monitor platelets by anticoagulation protocol: Yes   Plan:   Give warfarin 5 mg PO tonight Daily PT/INR and weekly CBC qMon Monitor for signs and symptoms of bleeding  Ailyn Gladd A. Levada Dy, PharmD, BCPS, FNKF Clinical Pharmacist [place] Please utilize Amion for appropriate phone number to reach the unit pharmacist ([place])

## 2020-05-16 DIAGNOSIS — G479 Sleep disorder, unspecified: Secondary | ICD-10-CM

## 2020-05-16 LAB — BASIC METABOLIC PANEL
Anion gap: 10 (ref 5–15)
BUN: 29 mg/dL — ABNORMAL HIGH (ref 8–23)
CO2: 24 mmol/L (ref 22–32)
Calcium: 8.5 mg/dL — ABNORMAL LOW (ref 8.9–10.3)
Chloride: 106 mmol/L (ref 98–111)
Creatinine, Ser: 1.29 mg/dL — ABNORMAL HIGH (ref 0.44–1.00)
GFR, Estimated: 41 mL/min — ABNORMAL LOW (ref >60)
Glucose, Bld: 115 mg/dL — ABNORMAL HIGH (ref 70–99)
Potassium: 4.2 mmol/L (ref 3.5–5.1)
Sodium: 140 mmol/L (ref 135–145)

## 2020-05-16 LAB — PROTIME-INR
INR: 2.7 — ABNORMAL HIGH (ref 0.8–1.2)
Prothrombin Time: 27.6 seconds — ABNORMAL HIGH (ref 11.4–15.2)

## 2020-05-16 MED ORDER — MELATONIN 3 MG PO TABS
3.0000 mg | ORAL_TABLET | Freq: Every day | ORAL | Status: DC
  Administered 2020-05-16 – 2020-05-21 (×6): 3 mg via ORAL
  Filled 2020-05-16 (×6): qty 1

## 2020-05-16 MED ORDER — WARFARIN SODIUM 5 MG PO TABS
5.0000 mg | ORAL_TABLET | Freq: Once | ORAL | Status: AC
  Administered 2020-05-16: 5 mg via ORAL
  Filled 2020-05-16: qty 1

## 2020-05-16 MED ORDER — TRAZODONE HCL 50 MG PO TABS: 25.0000 mg | ORAL_TABLET | Freq: Every evening | ORAL | Status: DC | PRN

## 2020-05-16 NOTE — Progress Notes (Signed)
Physical Therapy Session Note  Patient Details  Name: [name] MRN: [id number] Date of Birth: Oct 26, 1933  Today's Date: 05/16/2020 PT Individual Time: 1300-1410 PT Individual Time Calculation (min): 70 min   Short Term Goals: Week 3:  PT Short Term Goal 1 (Week 3): STG = LTG due to ELOS  Skilled Therapeutic Interventions/Progress Updates:     Pt received sitting upright in w/c, agreeable to therapy. No reports of pain. Patient expressing frustration regarding slow rehab progress, anxiousness for returning home, and general complaints of healing process. She also reports frustration with recurrent UTI's and concern for another one with recent worsening dysuria (RN is aware, see prior notes). Therapeutic listening and words of encouragement provided which appeared to help. Offered to start therapy session outside to improve spirits as she hasn't been outside >1 month since hospitalization. Wheeled outside in the sun however patient reporting weather is too cool for her, therefore, we had to return to rehab floor. Stand<>pivot with minA for rising to RW and CGA for steadying from w/c to mat table. Completed her "warm up" exercises including hip marches, LAQ, and hip abd/add while seated. Sit>supine with modA and used large wedge behind her upper back (patient is very kyphotic and unlikely able to lay flat). Performed the following supine there-ex: -glut sets -quad sets -heel slides -SAQ -straight leg hip abduction/adduction  L knee flexion ROM measured with Goniometer - 73deg  Sit<>stand from mat table to RW, requiring minA for producing power to rise. Gait x50ft with CGA and RW. Continues to demo antalgic gait pattern with limited LLE weight shift, step-to, gait speed <0.2 m/s. W/c transport back to her room and pt requesting to return to bed to nap. Stand<>pivot with minA and no AD from w/c to EOB. Required maxA for sit>supine for BLE and trunk management. She ended session semi-reclined  with needs in reach and husband at bedside, pt made comfortable.  Therapy Documentation Precautions:  Precautions Precautions: Fall Restrictions Weight Bearing Restrictions: Yes LUE Weight Bearing: Weight bearing as tolerated LLE Weight Bearing: Weight bearing as tolerated General:    Therapy/Group: Individual Therapy  [name] P Nevin Kozuch PT 05/16/2020, 7:33 AM

## 2020-05-16 NOTE — Progress Notes (Signed)
ANTICOAGULATION CONSULT NOTE - Follow Up Consult  Pharmacy Consult for Warfarin Indication: atrial fibrillation  Allergies  Allergen Reactions  . Pradaxa [Dabigatran Etexilate Mesylate] Other (See Comments)    INTERNAL BLEEDING  . Clindamycin/Lincomycin Other (See Comments)    PT STATES HER DOCTOR TOLD HER NOT TO TAKE CLINDAMYCIN BECAUSE SHE GOT C-DIFF AFTER TAKING AMPICILLIN- "tolerated in 2022" (per spouse)  . Latex Other (See Comments)    Blisters   . Ampicillin Other (See Comments)    C. Diff after taking ampicillin - "tolerated in 2022" (per husband) Pt has received cephalosporins in 2013, 2014, 2017, 2019, 2022   . Sulfamethoxazole Diarrhea    Patient Measurements: Height: 5\' 4"  (162.6 cm) Weight: 85.9 kg (189 lb 6 oz) IBW/kg (Calculated) : 54.7  Vital Signs: Temp: 98.4 F (36.9 C) (02/18 0601) BP: 184/60 (02/18 0601) Pulse Rate: 71 (02/18 0601)  Labs: Recent Labs    05/14/20 0508 05/15/20 0609 05/16/20 0510  LABPROT 22.6* 26.4* 27.6*  INR 2.1* 2.5* 2.7*  CREATININE  --   --  1.29*    Estimated Creatinine Clearance: 34.4 mL/min (A) (by C-G formula based on SCr of 1.29 mg/dL (H)).  Assessment: 87 year old with PMH significant for PAF on warfarin, recurrent UTI, HTN, HLD admitted to [facility] 04/22/2020 with acute closed left intratrochanteric hip fracture.Patient developed worsening dysphagia, MRI was obtain which showed acute stroke. Warfarin held per neurology recommendation. Pharmacy consulted to restart Warfarin on 1/31.  INR was 2.3 upon admit. PTA warfarin dose is 5 mg daily EXCEPT for 7.5 mg on Tues/Thurs.  INR is therapeutic at 2.7 today but trending up. The patient also started cephalexin, will monitor INR closely due to interaction. Last Hgb low at 8.4, platelets 335.  Goal of Therapy:  INR 2-3 Monitor platelets by anticoagulation protocol: Yes   Plan:  Warfarin 5 mg PO tonight Daily PT/INR and weekly CBC qMon Monitor for signs and symptoms  of bleeding  [employer], Pharm.D., BCPS Clinical Pharmacist Clinical phone for 05/16/2020 from 7:30-3:00 is [phone].  **Pharmacist phone directory can be found on [email or web address] listed under [place].  05/16/2020 8:47 AM

## 2020-05-16 NOTE — Progress Notes (Signed)
Informed by OT that pt complaining of more pain during urination pt actively grimacing while voiding per OT, with continued pain after urination. Pt on 250mg  keflex for UTI.   Dayna Ramus

## 2020-05-16 NOTE — Progress Notes (Signed)
[place] PHYSICAL MEDICINE & REHABILITATION PROGRESS NOTE  Subjective/Complaints: Patient seen sitting up in her chair this morning.  She initially states she slept well overnight.  However her husband asks again for an antihistamine because that is what he gives her at home to "knock her out".  Discussed again using antihistaminics as sleep aids and associated risks.  Patient also complains about not being given the study in the morning to the restroom-discussed transition to home as well.  Discussed with nursing as well.  ROS: Denies CP, SOB, N/V/D  Objective: Vital Signs: Blood pressure (!) 184/60, pulse 71, temperature 98.4 F (36.9 C), resp. rate 20, height 5\' 4"  (1.626 m), weight 85.9 kg, SpO2 97 %. No results found. No results for input(s): WBC, HGB, HCT, PLT in the last 72 hours. Recent Labs    05/16/20 0510  NA 140  K 4.2  CL 106  CO2 24  GLUCOSE 115*  BUN 29*  CREATININE 1.29*  CALCIUM 8.5*    Intake/Output Summary (Last 24 hours) at 05/16/2020 1034 Last data filed at 05/15/2020 1851 Gross per 24 hour  Intake 240 ml  Output --  Net 240 ml        Physical Exam: BP (!) 184/60 (BP Location: Left Arm)   Pulse 71   Temp 98.4 F (36.9 C)   Resp 20   Ht 5\' 4"  (1.626 m)   Wt 85.9 kg   SpO2 97%   BMI 32.51 kg/m   Constitutional: No distress . Vital signs reviewed. HENT: Normocephalic.  Atraumatic. Eyes: EOMI. No discharge. Cardiovascular: No JVD.  RRR. Respiratory: Normal effort.  No stridor.  Bilateral clear to auscultation. GI: Non-distended.  BS +. Skin: Warm and dry.  Intact. Psych: Normal mood.  Normal behavior. Musc: Left hip with edema and tenderness, improving Neuro: Alert Motor: B/l UE: 4+/5 proximal distal Right lower extremity: 4-4+/5 proximal to distal, some pain inhibition, unchanged Left extremity: Hip flexion 3-/5, knee extension 4-/5, ankle dorsiflexion 4-/5 with pain inhibition HOH Right facial weakness, stable  Assessment/Plan: 1.  Functional deficits which require 3+ hours per day of interdisciplinary therapy in a comprehensive inpatient rehab setting.  Physiatrist is providing close team supervision and 24 hour management of active medical problems listed below.  Physiatrist and rehab team continue to assess barriers to discharge/monitor patient progress toward functional and medical goals   Care Tool:  Bathing    Body parts bathed by patient: Right arm,Left arm,Chest,Abdomen,Right upper leg,Left upper leg,Face,Front perineal area,Left lower leg,Right lower leg,Buttocks   Body parts bathed by helper: Buttocks,Right lower leg,Left lower leg     Bathing assist Assist Level: Minimal Assistance - Patient > 75%     Upper Body Dressing/Undressing Upper body dressing   What is the patient wearing?: Pull over shirt    Upper body assist Assist Level: Set up assist    Lower Body Dressing/Undressing Lower body dressing      What is the patient wearing?: Pants,Incontinence brief     Lower body assist Assist for lower body dressing: Moderate Assistance - Patient 50 - 74%     Toileting Toileting    Toileting assist Assist for toileting: Minimal Assistance - Patient > 75%     Transfers Chair/bed transfer  Transfers assist     Chair/bed transfer assist level: Minimal Assistance - Patient > 75%     Locomotion Ambulation   Ambulation assist   Ambulation activity did not occur: Safety/medical concerns  Assist level: Minimal Assistance - Patient >  75% Assistive device: Walker-rolling Max distance: 30   Walk 10 feet activity   Assist  Walk 10 feet activity did not occur: Safety/medical concerns  Assist level: Minimal Assistance - Patient > 75% Assistive device: Walker-rolling   Walk 50 feet activity   Assist Walk 50 feet with 2 turns activity did not occur: Safety/medical concerns         Walk 150 feet activity   Assist Walk 150 feet activity did not occur: Safety/medical  concerns         Walk 10 feet on uneven surface  activity   Assist Walk 10 feet on uneven surfaces activity did not occur: Safety/medical concerns         Wheelchair     Assist Will patient use wheelchair at discharge?: Yes Type of Wheelchair: Manual    Wheelchair assist level: Supervision/Verbal cueing Max wheelchair distance: 55 ft    Wheelchair 50 feet with 2 turns activity    Assist        Assist Level: Supervision/Verbal cueing   Wheelchair 150 feet activity     Assist      Assist Level: Total Assistance - Patient < 25%   Medical Problem List and Plan: 1.  Decreased functional ability secondary to left intertrochanteric femur fracture as well as history of left femur fracture May 2019 status post IM nailing as well as removal of hardware left femur from previous fracture 04/23/2020.  Weightbearing as tolerated.  Hospital course complicated by postoperative small acute infarct right cerebellum, bilateral occipital parietal lobes in the high left precentral gyrus.  Continue CIR  Discussed progress with therapies in accordance with planned discharge date 2.  Antithrombotics: -DVT/anticoagulation: Chronic Coumadin  INR therapeutic on 2/18             -antiplatelet therapy: n/a 3. Pain Management: Oxycodone as needed, Robaxin as needed  See #17  Relatively controlled with meds on 2/18  Scheduled AM oxycodone at 0730 per pt/husband request so that she receives it as she begins to move with therapy 4. Mood: Provide emotional support             -antipsychotic agents: N/A 5. Neuropsych: This patient is ?fully capable of making decisions on her own behalf. 6.  Acute blood loss anemia.    Hemoglobin 8.4 on 2/14, labs ordered for Monday 7.  Atrial fibrillation.  Toprol-XL 25 mg daily.  Continue Coumadin.  Cardiac rate controlled  HR controlled on 2/17  Monitor with increased activity 8. Hypertension: Chlorthalidone on hold.    Losartan 25, increased to  50 on 2/9, increased on 2/15  Increased toprol to 37.5mg  daily  Hydralazine 10 mg nightly started on 2/18  Remains elevated, particularly overnight on 2/18 9.  CKD stage III.  Baseline creatinine ?1.46.    Creatinine 1.29 on 2/18  Encourage fluids  Continue to monitor 10.  Hyperlipidemia: Lipitor 11. Recurrent UTIs.  Latest urinalysis study negative.    Continue Keflex--continue for 7 days (through 2/19) 12. Skin/Wound Care: Routine skin checks 13. Fluids/Electrolytes/Nutrition: Routine in and outs 14. Very hard of hearing- talk to her LEFT ear.  15.  Chronic diarrhea: used Imodium 2 mg BID daily at home  16. Leg length discrepancy- L shorter than Right- husband to bring in appropriate lift shoes.  17. L shoulder bicipital tendinitis based on clinical exam  Voltaren gel 2G QID 18.  Congestion  Flonase ordered  Resolved 19. GERD  Timing adjusted per patient/husband-husband would like medication at 715-discussed limitations  in giving medication at an exact time on a consistent basis  Improved overall 20.  Sleep disturbance  Medications increased on 2/18  Trazodone 25 nightly as needed started on 2/18  LOS: 17 days A FACE TO FACE EVALUATION WAS PERFORMED  Jenaye Rickert Lorie Phenix 05/16/2020, 10:34 AM

## 2020-05-16 NOTE — Progress Notes (Addendum)
Occupational Therapy Weekly Progress Note  Patient Details  Name: [name] MRN: [id number] Date of Birth: November 23, 1933  Beginning of progress report period: May 08, 2020 End of progress report period: May 16, 2020  Today's Date: 05/16/2020 OT Individual Time: 1001-1100 OT Individual Time Calculation (min): 59 min    Patient has met 4 of 4 short term goals.  She is making steady progress towards ADL and functional mobility independence. She now needs only min assist for LB ADLs overall and CGA to supervision using RW with increased time for toilet transfers and shower transfers and short in-room ambulation.  Pt does exhibit increased pain and stiffness LLE and requires increased assist and time to complete mobility first thing in the morning (needing min assist at times), however as pt mobilizes throughout the day, stiffness reduces and mobility improves requiring CGA to supervision.  Pt requires consistent VCs for safe RW management despite repetitive training due to pt keeps pushed too far in front of her.  Pt also exhibits significant forward and downward head posture with thoracic kyphosis limits pt's standing balance and safety during functional mobility.  Pt's husband has participated during yesterday and todays OT sessions, overall doing well, however, will benefit from further training to reinforce his safety awareness to be able to provide needed assist for pt.    Patient continues to demonstrate the following deficits: muscle weakness, decreased cardiorespiratoy endurance, decreased coordination and decreased standing balance, decreased postural control and decreased balance strategies and therefore will continue to benefit from skilled OT intervention to enhance overall performance with BADL.  Patient progressing toward long term goals..  Plan of care revisions: Upgraded goals per below:.  OT Short Term Goals Week 3:  OT Short Term Goal 1 (Week 3): STGs =LTGs due to  ELOS  Problem: RH Dressing Goal: LTG Patient will perform upper body dressing (OT) Description: LTG Patient will perform upper body dressing with assist, with/without cues (OT). Outcome: Completed/Met Flowsheets (Taken 05/16/2020 1210) LTG: Pt will perform upper body dressing with assistance level of: Set up assist   Problem: Sit to Stand Goal: LTG:  Patient will perform sit to stand in prep for activites of daily living with assistance level (OT) Description: LTG:  Patient will perform sit to stand in prep for activites of daily living with assistance level (OT) Flowsheets (Taken 05/16/2020 1210) LTG: PT will perform sit to stand in prep for activites of daily living with assistance level: Contact Guard/Touching assist   Problem: RH Bathing Goal: LTG Patient will bathe all body parts with assist levels (OT) Description: LTG: Patient will bathe all body parts with assist levels (OT) Flowsheets (Taken 05/16/2020 1210) LTG: Pt will perform bathing with assistance level/cueing: Contact Guard/Touching assist LTG: Position pt will perform bathing: Shower   Problem: RH Dressing Goal: LTG Patient will perform lower body dressing w/assist (OT) Description: LTG: Patient will perform lower body dressing with assist, with/without cues in positioning using equipment (OT) Flowsheets (Taken 05/16/2020 1210) LTG: Pt will perform lower body dressing with assistance level of: Minimal Assistance - Patient > 75%   Problem: RH Toileting Goal: LTG Patient will perform toileting task (3/3 steps) with assistance level (OT) Description: LTG: Patient will perform toileting task (3/3 steps) with assistance level (OT)  Flowsheets (Taken 05/16/2020 1210) LTG: Pt will perform toileting task (3/3 steps) with assistance level: Contact Guard/Touching assist   Problem: RH Toilet Transfers Goal: LTG Patient will perform toilet transfers w/assist (OT) Description: LTG: Patient will perform toilet transfers with  assist,  with/without cues using equipment (OT) Flowsheets (Taken 05/16/2020 1210) LTG: Pt will perform toilet transfers with assistance level of: Supervision/Verbal cueing               Skilled Therapeutic Interventions/Progress Updates:    First session: Pt sitting up in w/c, c/o 8/10 vaginal pain during urination but she also reports pain is present when not urinating as well.  Also reports 8/10 pain in LLE.  Nurse made aware and pt agreeable to receiving Tylenol to address.  Pt requesting to go to the bathroom.  Pt completed sit to stand needing multiple attempts to successfully come to full stance with min assist.  Ambulated using RW to bathroom and completed toilet transfer with increased time and CGA to 3 in 1 commode.  Pt completed pericare with CGA. Pt initiated pulling pants up but then discontinued and started ambulating with brief and pants partially up requiring VCs and min assist to complete task.  Pt ambulated to sink with RW and washed hands with close supervision.  Stand pivot with 2 left side steps to approach w/c completed requiring CGA and mod VC/TCs for sequencing of transfer.  Stand to sit at w/c with supervision. Pt then participated in sit<>stand blocked practice with multimodal cues provided to promote improved upright head, neck, and trunk posture.  Call bell in reach, seat alarm on at end of session.  Second session: Pt in bed asleep, very lethargic, reporting to fatigued from recent physical therapy session and requesting to rest and defer current OT session.  30 minutes treatment time missed. Call bell in reach, bed alarm on, husband in room.  Therapy Documentation Precautions:  Precautions Precautions: Fall Restrictions Weight Bearing Restrictions: Yes LUE Weight Bearing: Weight bearing as tolerated LLE Weight Bearing: Weight bearing as tolerated   Therapy/Group: Individual Therapy  Ezekiel Slocumb 05/16/2020, 11:05 AM

## 2020-05-16 NOTE — Plan of Care (Signed)
  Problem: RH Dressing Goal: LTG Patient will perform upper body dressing (OT) Description: LTG Patient will perform upper body dressing with assist, with/without cues (OT). Outcome: Completed/Met Flowsheets (Taken 05/16/2020 1210) LTG: Pt will perform upper body dressing with assistance level of: Set up assist   Problem: Sit to Stand Goal: LTG:  Patient will perform sit to stand in prep for activites of daily living with assistance level (OT) Description: LTG:  Patient will perform sit to stand in prep for activites of daily living with assistance level (OT) Flowsheets (Taken 05/16/2020 1210) LTG: PT will perform sit to stand in prep for activites of daily living with assistance level: Contact Guard/Touching assist   Problem: RH Bathing Goal: LTG Patient will bathe all body parts with assist levels (OT) Description: LTG: Patient will bathe all body parts with assist levels (OT) Flowsheets (Taken 05/16/2020 1210) LTG: Pt will perform bathing with assistance level/cueing: Contact Guard/Touching assist LTG: Position pt will perform bathing: Shower   Problem: RH Dressing Goal: LTG Patient will perform lower body dressing w/assist (OT) Description: LTG: Patient will perform lower body dressing with assist, with/without cues in positioning using equipment (OT) Flowsheets (Taken 05/16/2020 1210) LTG: Pt will perform lower body dressing with assistance level of: Minimal Assistance - Patient > 75%   Problem: RH Toileting Goal: LTG Patient will perform toileting task (3/3 steps) with assistance level (OT) Description: LTG: Patient will perform toileting task (3/3 steps) with assistance level (OT)  Flowsheets (Taken 05/16/2020 1210) LTG: Pt will perform toileting task (3/3 steps) with assistance level: Contact Guard/Touching assist   Problem: RH Toilet Transfers Goal: LTG Patient will perform toilet transfers w/assist (OT) Description: LTG: Patient will perform toilet transfers with assist,  with/without cues using equipment (OT) Flowsheets (Taken 05/16/2020 1210) LTG: Pt will perform toilet transfers with assistance level of: Supervision/Verbal cueing

## 2020-05-16 NOTE — Plan of Care (Signed)
  Problem: Consults Goal: RH STROKE PATIENT EDUCATION Description: See Patient Education module for education specifics  Outcome: Progressing   Problem: RH BOWEL ELIMINATION Goal: RH STG MANAGE BOWEL WITH ASSISTANCE Description: STG Manage Bowel with min Assistance. Outcome: Progressing   Problem: RH BLADDER ELIMINATION Goal: RH STG MANAGE BLADDER WITH ASSISTANCE Description: STG Manage Bladder With min Assistance Outcome: Progressing   Problem: RH SKIN INTEGRITY Goal: RH STG MAINTAIN SKIN INTEGRITY WITH ASSISTANCE Description: STG Maintain Skin Integrity With min Assistance. Outcome: Progressing   Problem: RH SAFETY Goal: RH STG ADHERE TO SAFETY PRECAUTIONS W/ASSISTANCE/DEVICE Description: STG Adhere to Safety Precautions With min Assistance/Device. Outcome: Progressing   Problem: RH PAIN MANAGEMENT Goal: RH STG PAIN MANAGED AT OR BELOW PT'S PAIN GOAL Outcome: Progressing   Problem: RH KNOWLEDGE DEFICIT Goal: RH STG INCREASE KNOWLEDGE OF HYPERTENSION Description: Patient and spouse will be able to manage HTN with medications and dietary modifications using handouts and educational materials with cues/reminders Outcome: Progressing Goal: RH STG INCREASE KNOWLEGDE OF HYPERLIPIDEMIA Description: Patient and spouse will be able to manage HLD  with medications and dietary modifications using handouts and educational materials with cues/reminders Outcome: Progressing Goal: RH STG INCREASE KNOWLEDGE OF STROKE PROPHYLAXIS Description: Patient and spouse will be able to manage secondary stroke risks/prophylaxis with medications and dietary modifications using handouts and educational materials with cues/reminders Outcome: Progressing   Problem: Consults Goal: RH STROKE PATIENT EDUCATION Description: See Patient Education module for education specifics  Outcome: Progressing

## 2020-05-16 NOTE — Progress Notes (Signed)
Physical Therapy Session Note  Patient Details  Name: [name] MRN: [id number] Date of Birth: Mar 20, 1934  Today's Date: 05/16/2020 PT Individual Time: 1132-1218 PT Individual Time Calculation (min): 46 min   Short Term Goals: Week 3:  PT Short Term Goal 1 (Week 3): STG = LTG due to ELOS  Skilled Therapeutic Interventions/Progress Updates:    Patient seated in w/c upon PT arrival. Patient alert and agreeable to PT session. Patient relates 7/ 10 pain with WB in ambulation that decreases to a more bearable level when not weight bearing.   Therapeutic Activity: Transfers: Patient performed STS, SPVT throughout session with CGA using RW. Provided verbal cues for continuous effort throughout to complete and increasing forward lean for appropriate weight shift. Toilet transfer performed and initially unable to perofmrn SPVT despite BUE support. When provided with RW, pt is able to increase WB to LLE in order pick up RLE and progress stepping to perform pivot. Pt currently has UTI and experiences pain with urination. Supervision with pericare, Mod A for LB dressing.   Gait Training:  Patient ambulated 13' x1/ 69' x1 using RW with CGA. Ambulated with significant decrease in stance time to LLE. Provided verbal cues for increasing L knee flexion during swing through which pt is able to improve slightly for short periods.   Neuromuscular Re-ed: NMR facilitated during session with focus on standing static and dynamic balance. Pt guided in anterior reach for targets from head to hip height and outside of BOS. Pt encouraged to increase forward lean for improved hip flexion, ankle/ hip balance strategies, and coordinated functional movements. Increased distance from targets in order to include need for step to test judgement and sequencing.  Pt able to perform and hit correct targets with correct hands as called by this therapist and demos good judgment and balance with no LOB noted.   Patient seated in  w/c at end of session with brakes locked, belt alarm set, and all needs within reach and lunch arriving    Therapy Documentation Precautions:  Precautions Precautions: Fall Restrictions Weight Bearing Restrictions: Yes LUE Weight Bearing: Weight bearing as tolerated LLE Weight Bearing: Weight bearing as tolerated  Therapy/Group: Individual Therapy  Alger Simons 05/16/2020, 4:13 PM

## 2020-05-17 LAB — URINALYSIS, ROUTINE W REFLEX MICROSCOPIC
Bacteria, UA: NONE SEEN
Bilirubin Urine: NEGATIVE
Glucose, UA: NEGATIVE mg/dL
Ketones, ur: NEGATIVE mg/dL
Nitrite: POSITIVE — AB
Protein, ur: 30 mg/dL — AB
Specific Gravity, Urine: 1.016 (ref 1.005–1.030)
Trans Epithel, UA: 2
WBC, UA: >50 WBC/hpf — ABNORMAL HIGH (ref 0–5)
pH: 5 (ref 5.0–8.0)

## 2020-05-17 LAB — PROTIME-INR
INR: 2.9 — ABNORMAL HIGH (ref 0.8–1.2)
Prothrombin Time: 29.7 seconds — ABNORMAL HIGH (ref 11.4–15.2)

## 2020-05-17 MED ORDER — WARFARIN SODIUM 2.5 MG PO TABS
2.5000 mg | ORAL_TABLET | Freq: Once | ORAL | Status: AC
  Administered 2020-05-17: 2.5 mg via ORAL
  Filled 2020-05-17: qty 1

## 2020-05-17 NOTE — Progress Notes (Signed)
[place] PHYSICAL MEDICINE & REHABILITATION PROGRESS NOTE  Subjective/Complaints: Continues to have burning when she urinates despite completing Keflex course. UA reordered.  She has no other complaints Her husband at bedside has no other concerns.   ROS: Denies CP, SOB, N/V/D, +dysuria  Objective: Vital Signs: Blood pressure (!) 140/54, pulse 62, temperature 97.6 F (36.4 C), resp. rate 18, height 5\' 4"  (1.626 m), weight 84 kg, SpO2 99 %. No results found. No results for input(s): WBC, HGB, HCT, PLT in the last 72 hours. Recent Labs    05/16/20 0510  NA 140  K 4.2  CL 106  CO2 24  GLUCOSE 115*  BUN 29*  CREATININE 1.29*  CALCIUM 8.5*   No intake or output data in the 24 hours ending 05/17/20 1806      Physical Exam: BP (!) 140/54 (BP Location: Right Arm)   Pulse 62   Temp 97.6 F (36.4 C)   Resp 18   Ht 5\' 4"  (1.626 m)   Wt 84 kg   SpO2 99%   BMI 31.79 kg/m   Gen: no distress, normal appearing HEENT: oral mucosa pink and moist, NCAT Cardio: Reg rate Chest: normal effort, normal rate of breathing Abd: soft, non-distended Ext: no edema Psych: pleasant, normal affect Skin: intact Musc: Left hip with edema and tenderness, improving Neuro: Alert Motor: B/l UE: 4+/5 proximal distal Right lower extremity: 4-4+/5 proximal to distal, some pain inhibition, unchanged Left extremity: Hip flexion 3-/5, knee extension 4-/5, ankle dorsiflexion 4-/5 with pain inhibition HOH Right facial weakness, stable  Assessment/Plan: 1. Functional deficits which require 3+ hours per day of interdisciplinary therapy in a comprehensive inpatient rehab setting.  Physiatrist is providing close team supervision and 24 hour management of active medical problems listed below.  Physiatrist and rehab team continue to assess barriers to discharge/monitor patient progress toward functional and medical goals   Care Tool:  Bathing    Body parts bathed by patient: Right arm,Left  arm,Chest,Abdomen,Right upper leg,Left upper leg,Face,Front perineal area,Left lower leg,Right lower leg,Buttocks   Body parts bathed by helper: Buttocks,Right lower leg,Left lower leg     Bathing assist Assist Level: Minimal Assistance - Patient > 75%     Upper Body Dressing/Undressing Upper body dressing   What is the patient wearing?: Pull over shirt    Upper body assist Assist Level: Set up assist    Lower Body Dressing/Undressing Lower body dressing      What is the patient wearing?: Pants,Incontinence brief     Lower body assist Assist for lower body dressing: Moderate Assistance - Patient 50 - 74%     Toileting Toileting    Toileting assist Assist for toileting: Minimal Assistance - Patient > 75%     Transfers Chair/bed transfer  Transfers assist     Chair/bed transfer assist level: Minimal Assistance - Patient > 75%     Locomotion Ambulation   Ambulation assist   Ambulation activity did not occur: Safety/medical concerns  Assist level: Contact Guard/Touching assist Assistive device: Walker-rolling Max distance: 40'   Walk 10 feet activity   Assist  Walk 10 feet activity did not occur: Safety/medical concerns  Assist level: Contact Guard/Touching assist Assistive device: Walker-rolling   Walk 50 feet activity   Assist Walk 50 feet with 2 turns activity did not occur: Safety/medical concerns         Walk 150 feet activity   Assist Walk 150 feet activity did not occur: Safety/medical concerns  Walk 10 feet on uneven surface  activity   Assist Walk 10 feet on uneven surfaces activity did not occur: Safety/medical concerns         Wheelchair     Assist Will patient use wheelchair at discharge?: Yes Type of Wheelchair: Manual    Wheelchair assist level: Supervision/Verbal cueing Max wheelchair distance: 55 ft    Wheelchair 50 feet with 2 turns activity    Assist        Assist Level:  Supervision/Verbal cueing   Wheelchair 150 feet activity     Assist      Assist Level: Total Assistance - Patient < 25%   Medical Problem List and Plan: 1.  Decreased functional ability secondary to left intertrochanteric femur fracture as well as history of left femur fracture May 2019 status post IM nailing as well as removal of hardware left femur from previous fracture 04/23/2020.  Weightbearing as tolerated.  Hospital course complicated by postoperative small acute infarct right cerebellum, bilateral occipital parietal lobes in the high left precentral gyrus.  Continue CIR  Discussed progress with therapies in accordance with planned discharge date 2.  Antithrombotics: -DVT/anticoagulation: Chronic Coumadin INR therapeutic on 2/19, continue to monitor daily.              -antiplatelet therapy: n/a 3. Pain Management: Oxycodone as needed, Robaxin as needed  See #17  Relatively controlled with meds on 2/19  Scheduled AM oxycodone at 0730 per pt/husband request so that she receives it as she begins to move with therapy 4. Mood: Provide emotional support             -antipsychotic agents: N/A 5. Neuropsych: This patient is ?fully capable of making decisions on her own behalf. 6.  Acute blood loss anemia.    Hemoglobin 8.4 on 2/14, labs ordered for Monday 7.  Atrial fibrillation.  Toprol-XL 25 mg daily.  Continue Coumadin.  Cardiac rate controlled  HR controlled on 2/17  Monitor with increased activity 8. Hypertension: Chlorthalidone on hold.    Losartan 25, increased to 50 on 2/9, increased on 2/15  Increased toprol to 37.5mg  daily  Hydralazine 10 mg nightly started on 2/18  Remains elevated, particularly overnight on 1/28  Systolic elevated and diastolic soft- continue to monitor 9.  CKD stage III.  Baseline creatinine ?1.46.    Creatinine 1.29 on 2/18  Encourage fluids  Continue to monitor 10.  Hyperlipidemia: Lipitor 11. Recurrent UTIs.  Latest urinalysis study negative.     Continue Keflex--continue for 7 days (through 2/19)  Symptoms persist despite completion of Keflex. UA/UC reordered 12. Skin/Wound Care: Routine skin checks 13. Fluids/Electrolytes/Nutrition: Routine in and outs 14. Very hard of hearing- talk to her LEFT ear.  15.  Chronic diarrhea: used Imodium 2 mg BID daily at home  16. Leg length discrepancy- L shorter than Right- husband to bring in appropriate lift shoes.  17. L shoulder bicipital tendinitis based on clinical exam  Continue Voltaren gel 2G QID 18.  Congestion  Flonase ordered  Resolved 19. GERD  Timing adjusted per patient/husband-husband would like medication at 715-discussed limitations in giving medication at an exact time on a consistent basis  Improved overall 20.  Sleep disturbance  Medications increased on 2/18  Trazodone 25 nightly as needed started on 2/18  LOS: 18 days A FACE TO FACE EVALUATION WAS PERFORMED  Martha Clan P Theo Reither 05/17/2020, 6:06 PM

## 2020-05-17 NOTE — Progress Notes (Signed)
Occupational Therapy Session Note MAKEUP SESSION   Patient Details  Name: [name] MRN: [id number] Date of Birth: 22-Mar-1934  Today's Date: 05/17/2020 OT Individual Time: 1315-1355 OT Individual Time Calculation (min): 40 min    Short Term Goals: Week 1:  OT Short Term Goal 1 (Week 1): Pt will be able to rise to stand with RW with max A of 1 to prepare for toileting. OT Short Term Goal 1 - Progress (Week 1): Met OT Short Term Goal 2 (Week 1): Pt will be able to stand with RW and release 1 hand for clothing management with max A. OT Short Term Goal 2 - Progress (Week 1): Met OT Short Term Goal 3 (Week 1): Pt will be able to stand pivot with RW with max A of 1. OT Short Term Goal 3 - Progress (Week 1): Met OT Short Term Goal 4 (Week 1): Pt will demonstrate improved processing by donning shirt with min cues. OT Short Term Goal 4 - Progress (Week 1): Met  Skilled Therapeutic Interventions/Progress Updates:    Pt received in wc in room. Spouse not present. Pt stated she was tired but willing to do this extra session if "needed".  Recommended she try as she missed a session yesterday.  Pt transported to gym via w/c to focus on upright posture with ambulation.  Pt followed technique of pushing up with B hands and forward lean then putting hands on RW and able to do so with CGA.  Place tall rolling mirror 5 feet in front of pt and cued pt to keep her eyes on herself at all times, to not look down.  As pt took steps, moved mirror further.  She was able to take 20 steps with much improved upright posture but then needed to toilet urgently.  Pull wc up to pt and transported her back to room.    Using RW, pt completed stand pivot to elevated toilet, sat down, urinated, stood to self cleanse and pull pants up all with SUPERVISION!  Ambulated with RW with CGA out of bathroom to stand at sink to wash hands and then return to her wc.  Pt did extremely well this session. Resting in chair with chair pad  alarm on and all needs met.    Therapy Documentation Precautions:  Precautions Precautions: Fall Restrictions Weight Bearing Restrictions: Yes LUE Weight Bearing: Weight bearing as tolerated LLE Weight Bearing: Weight bearing as tolerated  Pain: Pain Assessment Pain Scale: 0-10 Pain Score: 0-No pain Pain Orientation: Left Pain Radiating Towards: leg Pain Descriptors / Indicators: Aching Pain Frequency: Intermittent Pain Intervention(s): Medication (See eMAR) ADL: ADL Eating: Set up Grooming: Setup Upper Body Bathing: Supervision/safety Where Assessed-Upper Body Bathing: Shower Lower Body Bathing: Minimal assistance Where Assessed-Lower Body Bathing: Shower Upper Body Dressing: Supervision/safety Where Assessed-Upper Body Dressing: Wheelchair Lower Body Dressing: Moderate assistance Where Assessed-Lower Body Dressing: Wheelchair Toileting: Supervision/safety Where Assessed-Toileting: Glass blower/designer: Close supervision Toilet Transfer Method: Arts development officer: Emergency planning/management officer Transfer: Environmental education officer Method: Radiographer, therapeutic: Grab bars   Therapy/Group: Individual Therapy  [place] 05/17/2020, 2:07 PM

## 2020-05-17 NOTE — Progress Notes (Signed)
Occupational Therapy Session Note  Patient Details  Name: [name] MRN: [id number] Date of Birth: 1933/08/26  Today's Date: 05/17/2020 OT Individual Time: 0905-1000 OT Individual Time Calculation (min): 55 min    Short Term Goals: Week 1:  OT Short Term Goal 1 (Week 1): Pt will be able to rise to stand with RW with max A of 1 to prepare for toileting. OT Short Term Goal 1 - Progress (Week 1): Met OT Short Term Goal 2 (Week 1): Pt will be able to stand with RW and release 1 hand for clothing management with max A. OT Short Term Goal 2 - Progress (Week 1): Met OT Short Term Goal 3 (Week 1): Pt will be able to stand pivot with RW with max A of 1. OT Short Term Goal 3 - Progress (Week 1): Met OT Short Term Goal 4 (Week 1): Pt will demonstrate improved processing by donning shirt with min cues. OT Short Term Goal 4 - Progress (Week 1): Met Week 2:  OT Short Term Goal 1 (Week 2): Pt will complete toilet transfer with a stand pivot with mod A or less on a consistent basis. OT Short Term Goal 1 - Progress (Week 2): Met OT Short Term Goal 2 (Week 2): Pt will be able to ambulate 8 feet to be able to access her bathroom. OT Short Term Goal 2 - Progress (Week 2): Met OT Short Term Goal 3 (Week 2): Pt will be able to stand and pull pants down to prepare for toileting. OT Short Term Goal 3 - Progress (Week 2): Met OT Short Term Goal 4 (Week 2): Pt will be able to pull pants up with mod A. OT Short Term Goal 4 - Progress (Week 2): Met Week 3:  OT Short Term Goal 1 (Week 3): STGs =LTGs due to ELOS  Skilled Therapeutic Interventions/Progress Updates:    Pt received in bed agreeable to a shower.  Pt sat to EOB with close S.  Had pt sit to EOB and spend 5 minutes doing "warm up" LE AROM exercises. Used RW to ambulate from bed to toilet.  She needed mod to stand from bed but then only min from [facility].  She continues to walk with a very hunched back needing constant cues to look forward not at her feet.   Pt moves at a slow pace but was able to take the steps with min A with RW.   Completed toileting mod then transferred to shower and used long sponge to reach feet.  Stood with bar for reaching bottom.    Used wc to transport from shower to room due to fatigue.  Completed LB dressing with A to start pants over feet but then with many cues for upright posture, pt pulled pants completely over hips.  Worked on sit to stands with forward lean and pushing up with  B hands vs 1 hand on the RW.  This helped her to stand more efficiently.  Worked on stand balance without UE support to giver her more freedom to use B hands to pull pants up. Pt was able to do this by the end of the session.  Resting in wc with all needs met. Spouse in room. He requested another session today as she missed 30 minutes yesterday because she was too fatigued to participate.  Informed him we would try to make it work.   Therapy Documentation Precautions:  Precautions Precautions: Fall Restrictions Weight Bearing Restrictions: Yes LUE Weight Bearing: Weight bearing  as tolerated LLE Weight Bearing: Weight bearing as tolerated  Pain: 6/10 L hip - premedicated   ADL: ADL Eating: Set up Grooming: Setup Upper Body Bathing: Supervision/safety Where Assessed-Upper Body Bathing: Shower Lower Body Bathing: Moderate assistance Where Assessed-Lower Body Bathing: Shower Upper Body Dressing: Supervision/safety Where Assessed-Upper Body Dressing: Wheelchair Lower Body Dressing: Maximal assistance Where Assessed-Lower Body Dressing: Wheelchair Toileting: Maximal assistance Where Assessed-Toileting: Glass blower/designer: Maximal Print production planner Method: Arts development officer: Energy manager Method:  (used stedy) Youth worker: Radio broadcast assistant   Therapy/Group: Individual Therapy  [place] 05/17/2020, 7:58 AM

## 2020-05-17 NOTE — Progress Notes (Signed)
ANTICOAGULATION CONSULT NOTE - Follow Up Consult  Pharmacy Consult for Warfarin Indication: atrial fibrillation  Allergies  Allergen Reactions  . Pradaxa [Dabigatran Etexilate Mesylate] Other (See Comments)    INTERNAL BLEEDING  . Clindamycin/Lincomycin Other (See Comments)    PT STATES HER DOCTOR TOLD HER NOT TO TAKE CLINDAMYCIN BECAUSE SHE GOT C-DIFF AFTER TAKING AMPICILLIN- "tolerated in 2022" (per spouse)  . Latex Other (See Comments)    Blisters   . Ampicillin Other (See Comments)    C. Diff after taking ampicillin - "tolerated in 2022" (per husband) Pt has received cephalosporins in 2013, 2014, 2017, 2019, 2022   . Sulfamethoxazole Diarrhea    Patient Measurements: Height: 5\' 4"  (162.6 cm) Weight: 84 kg (185 lb 3 oz) IBW/kg (Calculated) : 54.7  Vital Signs: Temp: 98.1 F (36.7 C) (02/19 0302) BP: 158/76 (02/19 0302) Pulse Rate: 65 (02/19 0302)  Labs: Recent Labs    05/15/20 0609 05/16/20 0510 05/17/20 0655  LABPROT 26.4* 27.6* 29.7*  INR 2.5* 2.7* 2.9*  CREATININE  --  1.29*  --     Estimated Creatinine Clearance: 34 mL/min (A) (by C-G formula based on SCr of 1.29 mg/dL (H)).  Assessment: 87 year old with PMH significant for PAF on warfarin, recurrent UTI, HTN, HLD admitted to [facility] 04/22/2020 with acute closed left intratrochanteric hip fracture.Patient developed worsening dysphagia, MRI was obtain which showed acute stroke. Warfarin held per neurology recommendation. Pharmacy consulted to restart Warfarin on 1/31.  INR was 2.3 upon admit. PTA warfarin dose is 5 mg daily EXCEPT for 7.5 mg on Tues/Thurs.  INR is therapeutic at 2.9 today but continues to trend up. The patient also started cephalexin, will monitor INR closely due to interaction. Last Hgb low at 8.4, platelets 335.  Goal of Therapy:  INR 2-3 Monitor platelets by anticoagulation protocol: Yes   Plan:  Warfarin 2.5 mg PO tonight Daily PT/INR and weekly CBC qMon Monitor for signs and  symptoms of bleeding  Fara Olden, PharmD PGY-1 Pharmacy Resident 05/17/2020 10:16 AM Please see AMION for all pharmacy numbers

## 2020-05-18 LAB — PROTIME-INR
INR: 2.8 — ABNORMAL HIGH (ref 0.8–1.2)
Prothrombin Time: 28.5 seconds — ABNORMAL HIGH (ref 11.4–15.2)

## 2020-05-18 MED ORDER — WARFARIN SODIUM 2.5 MG PO TABS
2.5000 mg | ORAL_TABLET | Freq: Once | ORAL | Status: AC
  Administered 2020-05-18: 2.5 mg via ORAL
  Filled 2020-05-18: qty 1

## 2020-05-18 NOTE — Progress Notes (Signed)
[place] PHYSICAL MEDICINE & REHABILITATION PROGRESS NOTE  Subjective/Complaints: Repeat UA is positive. UC pending. Discussed with patient that her last culture was resistant to Bactrim and Macrobid, and despite being sensitive to Keflex her symptoms did not improve. She notes she took a medication longterm at home for her UTIs (can't recall name)- I checked and we do not carry it. Agreed that we would wait to see culture results before starting abx, and husband would see if PCP can send script for her chronic UTI medication that she could take as patient's own med here.   ROS: Denies CP, SOB, N/V/D, +dysuria, +hip pain  Objective: Vital Signs: Blood pressure (!) 169/60, pulse 71, temperature 98.5 F (36.9 C), temperature source Oral, resp. rate 19, height 5\' 4"  (1.626 m), weight 83.8 kg, SpO2 95 %. No results found. No results for input(s): WBC, HGB, HCT, PLT in the last 72 hours. Recent Labs    05/16/20 0510  NA 140  K 4.2  CL 106  CO2 24  GLUCOSE 115*  BUN 29*  CREATININE 1.29*  CALCIUM 8.5*    Intake/Output Summary (Last 24 hours) at 05/18/2020 1351 Last data filed at 05/18/2020 1300 Gross per 24 hour  Intake 356 ml  Output 100 ml  Net 256 ml        Physical Exam: BP (!) 169/60   Pulse 71   Temp 98.5 F (36.9 C) (Oral)   Resp 19   Ht 5\' 4"  (1.626 m)   Wt 83.8 kg   SpO2 95%   BMI 31.71 kg/m   Gen: no distress, normal appearing HEENT: oral mucosa pink and moist, NCAT Cardio: Reg rate Chest: normal effort, normal rate of breathing Abd: soft, non-distended Ext: no edema Psych: pleasant, normal affect Skin: intact  Musc: Left hip with edema and tenderness, improving Neuro: Alert Motor: B/l UE: 4+/5 proximal distal Right lower extremity: 4-4+/5 proximal to distal, some pain inhibition, unchanged Left extremity: Hip flexion 3-/5, knee extension 4-/5, ankle dorsiflexion 4-/5 with pain inhibition HOH Right facial weakness, stable  Assessment/Plan: 1.  Functional deficits which require 3+ hours per day of interdisciplinary therapy in a comprehensive inpatient rehab setting.  Physiatrist is providing close team supervision and 24 hour management of active medical problems listed below.  Physiatrist and rehab team continue to assess barriers to discharge/monitor patient progress toward functional and medical goals   Care Tool:  Bathing    Body parts bathed by patient: Right arm,Left arm,Chest,Abdomen,Right upper leg,Left upper leg,Face,Front perineal area,Left lower leg,Right lower leg,Buttocks   Body parts bathed by helper: Buttocks,Right lower leg,Left lower leg     Bathing assist Assist Level: Minimal Assistance - Patient > 75%     Upper Body Dressing/Undressing Upper body dressing   What is the patient wearing?: Pull over shirt    Upper body assist Assist Level: Set up assist    Lower Body Dressing/Undressing Lower body dressing      What is the patient wearing?: Pants,Incontinence brief     Lower body assist Assist for lower body dressing: Moderate Assistance - Patient 50 - 74%     Toileting Toileting    Toileting assist Assist for toileting: Minimal Assistance - Patient > 75%     Transfers Chair/bed transfer  Transfers assist     Chair/bed transfer assist level: Minimal Assistance - Patient > 75%     Locomotion Ambulation   Ambulation assist   Ambulation activity did not occur: Safety/medical concerns  Assist level: Contact Guard/Touching  assist Assistive device: Walker-rolling Max distance: 40'   Walk 10 feet activity   Assist  Walk 10 feet activity did not occur: Safety/medical concerns  Assist level: Contact Guard/Touching assist Assistive device: Walker-rolling   Walk 50 feet activity   Assist Walk 50 feet with 2 turns activity did not occur: Safety/medical concerns         Walk 150 feet activity   Assist Walk 150 feet activity did not occur: Safety/medical concerns          Walk 10 feet on uneven surface  activity   Assist Walk 10 feet on uneven surfaces activity did not occur: Safety/medical concerns         Wheelchair     Assist Will patient use wheelchair at discharge?: Yes Type of Wheelchair: Manual    Wheelchair assist level: Supervision/Verbal cueing Max wheelchair distance: 55 ft    Wheelchair 50 feet with 2 turns activity    Assist        Assist Level: Supervision/Verbal cueing   Wheelchair 150 feet activity     Assist      Assist Level: Total Assistance - Patient < 25%   Medical Problem List and Plan: 1.  Decreased functional ability secondary to left intertrochanteric femur fracture as well as history of left femur fracture May 2019 status post IM nailing as well as removal of hardware left femur from previous fracture 04/23/2020.  Weightbearing as tolerated.  Hospital course complicated by postoperative small acute infarct right cerebellum, bilateral occipital parietal lobes in the high left precentral gyrus.  Continue CIR  Discussed progress with therapies in accordance with planned discharge date 2.  Antithrombotics: -DVT/anticoagulation: Chronic Coumadin INR therapeutic on 2/20, continue to monitor daily.              -antiplatelet therapy: n/a 3. Pain Management: Continue Oxycodone as needed (using), Robaxin as needed  See #17  Relatively controlled with meds on 2/19  Scheduled AM oxycodone at 0730 per pt/husband request so that she receives it as she begins to move with therapy 4. Mood: Provide emotional support             -antipsychotic agents: N/A 5. Neuropsych: This patient is ?fully capable of making decisions on her own behalf. 6.  Acute blood loss anemia.    Hemoglobin 8.4 on 2/14, labs ordered for Monday 7.  Atrial fibrillation.  Toprol-XL 25 mg daily.  Continue Coumadin.  Cardiac rate controlled  HR controlled on 2/17  Monitor with increased activity 8. Hypertension: Chlorthalidone on hold.     Losartan 25, increased to 50 on 2/9, increased on 2/15  Increased toprol to 37.5mg  daily  Hydralazine 10 mg nightly started on 2/18  Remains elevated, particularly overnight on 0/07  Systolic elevated and diastolic soft- continue to monitor 9.  CKD stage III.  Baseline creatinine ?1.46.    Creatinine 1.29 on 2/18  Encourage fluids  Continue to monitor 10.  Hyperlipidemia: Lipitor 11. Recurrent UTIs.  Latest urinalysis study negative.    Continue Keflex--continue for 7 days (through 2/19)  Symptoms persist despite completion of Keflex. UA/UC reordered  2/20: Repeat UA positive. UC pending. Will wait for sensitivities before starting abx given that she is resistant to many abx. Husband will f/u with her outpatient urologist to see if he can get her outpatient abx for chronic UTIs, which he do not carry here. 12. Skin/Wound Care: Routine skin checks 13. Fluids/Electrolytes/Nutrition: Routine in and outs 14. Very hard of hearing- talk to  her LEFT ear.  15.  Chronic diarrhea: used Imodium 2 mg BID daily at home  16. Leg length discrepancy- L shorter than Right- husband to bring in appropriate lift shoes.  17. L shoulder bicipital tendinitis based on clinical exam  Continue Voltaren gel 2G QID 18.  Congestion  Flonase ordered  Resolved 19. GERD  Timing adjusted per patient/husband-husband would like medication at 715-discussed limitations in giving medication at an exact time on a consistent basis  Improved overall 20.  Sleep disturbance  Medications increased on 2/18  Trazodone 25 nightly as needed started on 2/18  LOS: 19 days A FACE TO FACE EVALUATION WAS PERFORMED  Imanie Darrow P Linken [place] 05/18/2020, 1:51 PM

## 2020-05-18 NOTE — Progress Notes (Signed)
ANTICOAGULATION CONSULT NOTE - Follow Up Consult  Pharmacy Consult for Warfarin Indication: atrial fibrillation  Allergies  Allergen Reactions  . Pradaxa [Dabigatran Etexilate Mesylate] Other (See Comments)    INTERNAL BLEEDING  . Clindamycin/Lincomycin Other (See Comments)    PT STATES HER DOCTOR TOLD HER NOT TO TAKE CLINDAMYCIN BECAUSE SHE GOT C-DIFF AFTER TAKING AMPICILLIN- "tolerated in 2022" (per spouse)  . Latex Other (See Comments)    Blisters   . Ampicillin Other (See Comments)    C. Diff after taking ampicillin - "tolerated in 2022" (per husband) Pt has received cephalosporins in 2013, 2014, 2017, 2019, 2022   . Sulfamethoxazole Diarrhea    Patient Measurements: Height: 5\' 4"  (162.6 cm) Weight: 83.8 kg (184 lb 11.9 oz) IBW/kg (Calculated) : 54.7  Vital Signs: Temp: 98 F (36.7 C) (02/20 0401) BP: 174/65 (02/20 0428) Pulse Rate: 63 (02/20 0428)  Labs: Recent Labs    05/16/20 0510 05/17/20 0655 05/18/20 0510  LABPROT 27.6* 29.7* 28.5*  INR 2.7* 2.9* 2.8*  CREATININE 1.29*  --   --     Estimated Creatinine Clearance: 34 mL/min (A) (by C-G formula based on SCr of 1.29 mg/dL (H)).  Assessment: 87 year old with PMH significant for PAF on warfarin, recurrent UTI, HTN, HLD admitted to [facility] 04/22/2020 with acute closed left intratrochanteric hip fracture.Patient developed worsening dysphagia, MRI was obtain which showed acute stroke. Warfarin held per neurology recommendation. Pharmacy consulted to restart Warfarin on 1/31.  INR was 2.3 upon admit. PTA warfarin dose is 5 mg daily EXCEPT for 7.5 mg on Tues/Thurs.  INR is therapeutic at 2.8 today. The patient was on cephalexin 2/12 > 2/19 which may have increased warfarin's effect. Last Hgb low at 8.4, platelets 335. Next CBC on 2/21.  Goal of Therapy:  INR 2-3 Monitor platelets by anticoagulation protocol: Yes   Plan:  Warfarin 2.5 mg PO tonight Daily PT/INR and weekly CBC qMon Monitor for signs and  symptoms of bleeding  Fara Olden, PharmD PGY-1 Pharmacy Resident 05/18/2020 8:04 AM Please see AMION for all pharmacy numbers

## 2020-05-19 LAB — CBC WITH DIFFERENTIAL/PLATELET
Abs Immature Granulocytes: 0.04 10*3/uL (ref 0.00–0.07)
Basophils Absolute: 0 10*3/uL (ref 0.0–0.1)
Basophils Relative: 0 %
Eosinophils Absolute: 0.2 10*3/uL (ref 0.0–0.5)
Eosinophils Relative: 2 %
HCT: 27.9 % — ABNORMAL LOW (ref 36.0–46.0)
Hemoglobin: 9 g/dL — ABNORMAL LOW (ref 12.0–15.0)
Immature Granulocytes: 0 %
Lymphocytes Relative: 9 %
Lymphs Abs: 0.9 10*3/uL (ref 0.7–4.0)
MCH: 29.3 pg (ref 26.0–34.0)
MCHC: 32.3 g/dL (ref 30.0–36.0)
MCV: 90.9 fL (ref 80.0–100.0)
Monocytes Absolute: 0.5 10*3/uL (ref 0.1–1.0)
Monocytes Relative: 5 %
Neutro Abs: 8.4 10*3/uL — ABNORMAL HIGH (ref 1.7–7.7)
Neutrophils Relative %: 84 %
Platelets: 307 10*3/uL (ref 150–400)
RBC: 3.07 MIL/uL — ABNORMAL LOW (ref 3.87–5.11)
RDW: 14.6 % (ref 11.5–15.5)
WBC: 10 10*3/uL (ref 4.0–10.5)
nRBC: 0 % (ref 0.0–0.2)

## 2020-05-19 LAB — PROTIME-INR
INR: 2.4 — ABNORMAL HIGH (ref 0.8–1.2)
Prothrombin Time: 25 seconds — ABNORMAL HIGH (ref 11.4–15.2)

## 2020-05-19 MED ORDER — WARFARIN SODIUM 5 MG PO TABS
5.0000 mg | ORAL_TABLET | ORAL | Status: DC
  Administered 2020-05-19: 5 mg via ORAL
  Filled 2020-05-19: qty 1

## 2020-05-19 MED ORDER — NITROFURANTOIN MONOHYD MACRO 100 MG PO CAPS
100.0000 mg | ORAL_CAPSULE | Freq: Two times a day (BID) | ORAL | Status: DC
  Administered 2020-05-19 – 2020-05-20 (×3): 100 mg via ORAL
  Filled 2020-05-19 (×3): qty 1

## 2020-05-19 MED ORDER — LEVOFLOXACIN 250 MG PO TABS
250.0000 mg | ORAL_TABLET | Freq: Every day | ORAL | Status: DC
  Administered 2020-05-19 – 2020-05-20 (×2): 250 mg via ORAL
  Filled 2020-05-19 (×2): qty 1

## 2020-05-19 MED ORDER — WARFARIN SODIUM 7.5 MG PO TABS: 7.5000 mg | ORAL_TABLET | ORAL | Status: DC

## 2020-05-19 NOTE — Progress Notes (Addendum)
[facility] PHYSICAL MEDICINE & REHABILITATION PROGRESS NOTE  Subjective/Complaints: Burning with urination, hx of recurrent UTIs , sees Dr Amalia Hailey from [facility] as OP UA+ , cx pending  ROS: Denies CP, SOB, N/V/D, +dysuria, +hip pain  Objective: Vital Signs: Blood pressure (!) 177/64, pulse 61, temperature 97.9 F (36.6 C), resp. rate 18, height 5\' 4"  (1.626 m), weight 83.7 kg, SpO2 95 %. No results found. No results for input(s): WBC, HGB, HCT, PLT in the last 72 hours. No results for input(s): NA, K, CL, CO2, GLUCOSE, BUN, CREATININE, CALCIUM in the last 72 hours.  Intake/Output Summary (Last 24 hours) at 05/19/2020 0841 Last data filed at 05/19/2020 0751 Gross per 24 hour  Intake 952 ml  Output -  Net 952 ml        Physical Exam: BP (!) 177/64 (BP Location: Right Arm)   Pulse 61   Temp 97.9 F (36.6 C)   Resp 18   Ht 5\' 4"  (1.626 m)   Wt 83.7 kg   SpO2 95%   BMI 31.67 kg/m    General: No acute distress Mood and affect are appropriate Heart: Regular rate and rhythm no rubs murmurs or extra sounds Lungs: Clear to auscultation, breathing unlabored, no rales or wheezes Abdomen: Positive bowel sounds, soft nontender to palpation, nondistended Extremities: No clubbing, cyanosis, or edema Skin: No evidence of breakdown, no evidence of rash  Musc: Left hip with edema and tenderness, improving Neuro: Alert Motor: B/l UE: 4+/5 proximal distal Right lower extremity: 4-4+/5 proximal to distal, some pain inhibition, unchanged Left extremity: Hip flexion 3-/5, knee extension 4-/5, ankle dorsiflexion 4-/5 with pain inhibition HOH Right facial weakness, stable  Assessment/Plan: 1. Functional deficits which require 3+ hours per day of interdisciplinary therapy in a comprehensive inpatient rehab setting.  Physiatrist is providing close team supervision and 24 hour management of active medical problems listed below.  Physiatrist and rehab team continue to assess barriers to  discharge/monitor patient progress toward functional and medical goals   Care Tool:  Bathing    Body parts bathed by patient: Right arm,Left arm,Chest,Abdomen,Right upper leg,Left upper leg,Face,Front perineal area,Left lower leg,Right lower leg,Buttocks   Body parts bathed by helper: Buttocks,Right lower leg,Left lower leg     Bathing assist Assist Level: Minimal Assistance - Patient > 75%     Upper Body Dressing/Undressing Upper body dressing   What is the patient wearing?: Pull over shirt    Upper body assist Assist Level: Set up assist    Lower Body Dressing/Undressing Lower body dressing      What is the patient wearing?: Pants,Incontinence brief     Lower body assist Assist for lower body dressing: Moderate Assistance - Patient 50 - 74%     Toileting Toileting    Toileting assist Assist for toileting: Minimal Assistance - Patient > 75%     Transfers Chair/bed transfer  Transfers assist     Chair/bed transfer assist level: Minimal Assistance - Patient > 75%     Locomotion Ambulation   Ambulation assist   Ambulation activity did not occur: Safety/medical concerns  Assist level: Contact Guard/Touching assist Assistive device: Walker-rolling Max distance: 40'   Walk 10 feet activity   Assist  Walk 10 feet activity did not occur: Safety/medical concerns  Assist level: Contact Guard/Touching assist Assistive device: Walker-rolling   Walk 50 feet activity   Assist Walk 50 feet with 2 turns activity did not occur: Safety/medical concerns         Walk 150  feet activity   Assist Walk 150 feet activity did not occur: Safety/medical concerns         Walk 10 feet on uneven surface  activity   Assist Walk 10 feet on uneven surfaces activity did not occur: Safety/medical concerns         Wheelchair     Assist Will patient use wheelchair at discharge?: Yes Type of Wheelchair: Manual    Wheelchair assist level:  Supervision/Verbal cueing Max wheelchair distance: 55 ft    Wheelchair 50 feet with 2 turns activity    Assist        Assist Level: Supervision/Verbal cueing   Wheelchair 150 feet activity     Assist      Assist Level: Total Assistance - Patient < 25%   Medical Problem List and Plan: 1.  Decreased functional ability secondary to left intertrochanteric femur fracture as well as history of left femur fracture May 2019 status post IM nailing as well as removal of hardware left femur from previous fracture 04/23/2020.  Weightbearing as tolerated.  Hospital course complicated by postoperative small acute infarct right cerebellum, bilateral occipital parietal lobes in the high left precentral gyrus.  Continue CIR PT,OT  Discussed progress with therapies in accordance with planned discharge date 2.  Antithrombotics: -DVT/anticoagulation: Chronic Coumadin INR therapeutic on 2/20, continue to monitor daily.              -antiplatelet therapy: n/a 3. Pain Management: Continue Oxycodone as needed (using), Robaxin as needed  See #17  Relatively controlled with meds on 2/19  Scheduled AM oxycodone at 0730 per pt/husband request so that she receives it as she begins to move with therapy 4. Mood: Provide emotional support             -antipsychotic agents: N/A 5. Neuropsych: This patient is ?fully capable of making decisions on her own behalf. 6.  Acute blood loss anemia.    Hemoglobin 8.4 on 2/14, labs ordered for Monday 7.  Atrial fibrillation.  Toprol-XL 25 mg daily.  Continue Coumadin.  Cardiac rate controlled  HR controlled on 2/17  Monitor with increased activity 8. Hypertension: Chlorthalidone on hold.    Losartan 25, increased to 50 on 2/9, increased on 2/15  Increased toprol to 37.5mg  daily  Hydralazine 10 mg nightly started on 2/21  Remains elevated,    Vitals:   05/18/20 2020 05/19/20 0437  BP:  (!) 177/64  Pulse: 70 61  Resp:  18  Temp:  97.9 F (36.6 C)  SpO2:  100% 95%   9.  CKD stage III.  Baseline creatinine ?1.46.    Creatinine 1.29 on 2/18  Encourage fluids  Continue to monitor 10.  Hyperlipidemia: Lipitor 11. Recurrent UTIs.  Latest urinalysis study negative.     Symptoms persist despite completion of Keflex.   2/20: Repeat UA positive. UC pending.allergic to SMX/TMP, amp has contributed to C dif in past ,the initial results show Pseudomonas aeruginosa, >100K start levaquin d/c empiric macrobid (may have received 1 dose)  12. Skin/Wound Care: Routine skin checks 13. Fluids/Electrolytes/Nutrition: Routine in and outs 14. Very hard of hearing- talk to her LEFT ear.  15.  Chronic diarrhea: used Imodium 2 mg BID daily at home  16. Leg length discrepancy- L shorter than Right- husband to bring in appropriate lift shoes.  17. L shoulder bicipital tendinitis based on clinical exam  Continue Voltaren gel 2G QID 18.  Congestion  Flonase ordered  Resolved 19. GERD  Timing adjusted per  patient/husband-husband would like medication at 715-discussed limitations in giving medication at an exact time on a consistent basis  Improved overall 20.  Sleep disturbance  Medications increased on 2/18  Trazodone 25 nightly as needed started on 2/18  LOS: 20 days A FACE TO [place] E Khyrie Masi 05/19/2020, 8:41 AM

## 2020-05-19 NOTE — Progress Notes (Signed)
Physical Therapy Session Note  Patient Details  Name: [name] MRN: [id number] Date of Birth: 1933-08-24  Today's Date: 05/19/2020 PT Individual Time: 5830-9407 PT Individual Time Calculation (min): 30 min   Short Term Goals: Week 1:  PT Short Term Goal 1 (Week 1): Pt will complete bed mobility with maxA PT Short Term Goal 2 (Week 1): Pt will complete bed<>chair transfers with maxA and LRAD PT Short Term Goal 3 (Week 1): Pt will initiate gait training with LRAD Week 2:  PT Short Term Goal 1 (Week 2): Pt will consistently perform bed mobility with modA PT Short Term Goal 2 (Week 2): Pt will consistently perform sit<>stand transfers with modA and LRAD PT Short Term Goal 3 (Week 2): Pt will perform bed<>chair transfers wtih modA PT Short Term Goal 4 (Week 2): Pt will ambulate 6ft with modA and LRAD Week 3:  PT Short Term Goal 1 (Week 3): STG = LTG due to ELOS Week 4:     Skilled Therapeutic Interventions/Progress Updates:    PAIN denies pain.  Pt seen this am for 30 min session w/focus on: Functional mobility, functional strengthening.  Pt initially supine w/hob elevated.  Supine to sit on edge of bed w/min to mod assist and use of bed features, additional time.  Sit to stand w/min assist and gait x 54ft to wc, turn/sit w/cues for safety, cues for posture, very slow cadence. Pt transported to BR. stand pivot transfer to commode w/RW w/min assist, cues.  Clothing management w/mod assist in standing. Pt independent w/hygiene on commode, continent of urine and bowels and urinary incontinence noted in brief.  Therapist provided/donned clean brief. stand pivot transfer to wc w/min assist, cues for posture and safety.  Husband in at end of session.  Noted pt not wearing TED hose.  Therapist applied.  Pt left oob in wc w/alarm belt set and needs in reach   Therapy Documentation Precautions:  Precautions Precautions: Fall Restrictions Weight Bearing Restrictions: Yes LUE Weight  Bearing: Weight bearing as tolerated LLE Weight Bearing: Weight bearing as tolerated    Therapy/Group: Individual Therapy  Callie Fielding, [place] 05/19/2020, 12:53 PM

## 2020-05-19 NOTE — Progress Notes (Signed)
Occupational Therapy Session Note  Patient Details  Name: Fern Canova MRN: [id number] Date of Birth: 01-09-1934  Today's Date: 05/19/2020 OT Individual Time: 1000-1100 OT Individual Time Calculation (min): 60 min    Short Term Goals: Week 3:  OT Short Term Goal 1 (Week 3): STGs =LTGs due to ELOS  Skilled Therapeutic Interventions/Progress Updates:    Pt sitting up in w/c, reporting intermittent sharp pains in vaginal region; nursing and MD aware.  OT session focused on postural control, sit<>stand transfers, RW management, and dynamic standing balance.  Also focused on BUE and periscapular strengthening to facilitate improved upright posture. Pt completed blocked practice sit<>stand and ambulation of 5 feet using RW w/c<>arm chair.  Pt initially requiring min assist to stand however after OT providing VCs to improve body mechanics (widen BOS, and shift trunk anteriorly), this increased her independence during sit to stand to supervision.  Pt performed bilateral scapular retraction, shoulder ER, and shoulder horizontal abduction, 3 x 10 reps each, using level 2 therapy band.  Pt required intermittent VCs and visual cues to promote good positioning and thoracic/cervical posture.  Issued HEP handout to improve carryover.  Call bell in reach, seat belt alarm on at end of session.    Therapy Documentation Precautions:  Precautions Precautions: Fall Restrictions Weight Bearing Restrictions: Yes LUE Weight Bearing: Weight bearing as tolerated LLE Weight Bearing: Weight bearing as tolerated   Therapy/Group: Individual Therapy  Ezekiel Slocumb 05/19/2020, 12:16 PM

## 2020-05-19 NOTE — Progress Notes (Signed)
Inpatient Rehabilitation Care Coordinator Discharge Note  The overall goal for the admission was met for:   Discharge location: Yes-HOME WITH HUSBAND WHO IS AWARE OF HER 24/7 CARE NEEDS  Length of Stay: Yes-23 DAYS  Discharge activity level: Yes-SUPERVISION-MIN ASSIST LEVEL  Home/community participation: Yes  Services provided included: MD, RD, PT, OT, SLP, RN, CM, Pharmacy, Neuropsych and SW  Financial Services: Medicare and Private Insurance: [employer] [place]  Choices offered to/list presented to:YES  Follow-up services arranged: Home Health: [facility]-PT,OT,RN AIDE, DME: [facility]-3 IN 1 and Patient/Family request agency HH: [facility] WITH [facility], DME: NO PREF  Comments (or additional information):HUSBAND WAS HERE DAILY AND WAS SHOWN AND DID HANDS ON CARE, BUT STILL SOMEWHAT DISTRACTED AND WANTS TO DO HIS OLD ROUTINE-GOLF AND DINNER WITH FRIENDS. AWARE PT WILL REQUIRE 24/7 CARE AND NOT TO LEAVE PT ALONE  Patient/Family verbalized understanding of follow-up arrangements: Yes  Individual responsible for coordination of the follow-up plan: JOHN-HUSBAND [phone]  Confirmed correct DME delivered: [name], Carrie G 05/19/2020    [name], Carrie G 

## 2020-05-19 NOTE — Progress Notes (Signed)
Physical Therapy Session Note  Patient Details  Name: [name] MRN: [id number] Date of Birth: 09-29-1933  Today's Date: 05/19/2020 PT Individual Time: 1300-1400 + 1445-1530 PT Individual Time Calculation (min): 60 min + 45 min  Short Term Goals: Week 3:  PT Short Term Goal 1 (Week 3): STG = LTG due to ELOS  Skilled Therapeutic Interventions/Progress Updates:     1st session: Pt received sitting in manual w/c, awake and agreeable to therapy. No reports of pain. W/c transport for time management to main rehab gym. Wheeled in front of the steps to trial stair negotiation but after demonstration of technique, patient reporting "my husband wants me to do stairs, but that's not my goal" and she ultimately deferred efforts as she was fearful. Completed her "Warm up" set of seated there-ex including unweighted hip marches and LAQ. Sit<>stand with close supervision to RW from w/c height and she ambulated 56ft with close supervision and RW. Improved gait speed but still <0.5 m/s placing her at limited househould ambulator. Performed w/c propulsion with minA using BLE's only, focusing on L hamstring facilitation and knee ROM, requiring minA for propelling and maintaining straight path. Performed car transfer, able to complete transfer with CGA and RW but required minA for LLE management into and out of the car. Therapist had to remove her L shoe to assist with clearance of threshold into the car and educated patient on strategies for her car such as reclining seat and sliding seat backwards. Performed UE ergometer while seated in w/c at level 1, able to complete ~4 minutes prior to fatigue and ergometer focusing on upper extremity scapular motion and ROM due to significant kyphosis/rounded shoulders and tight pectorals likely 2/2 B mastectomy. Pt returned with totalA in her w/c to her room and she remained seated in w/c with needs in reach and husband at bedside.   2nd session: Pt received sitting in w/c,  agreeable to therapy. No reports of pain but endorses generalized fatigue from busy day of therapies. W/c transport for time management to dayroom gym. Completed her routine "warm up" exercises while seated in w/c including LAQ and hip marches. Gait training 78ft + 20ft with close supervision and RW; antalgic gait with step-to gait pattern but no knee buckling or LOB, very slowed speed. Completed Kinetron at 50cm/sec resistance, required assist for foot placement to setup. Completed for x10 minutes working on LLE strengthening and active-assisted ROM to knee and hip. Pt ended session seated in w/c with chair alarm on, needs within reach.  Therapy Documentation Precautions:  Precautions Precautions: Fall Restrictions Weight Bearing Restrictions: Yes LUE Weight Bearing: Weight bearing as tolerated LLE Weight Bearing: Weight bearing as tolerated  Therapy/Group: Individual Therapy  Tomie [place]ko P Marvina Danner PT 05/19/2020, 7:34 AM

## 2020-05-20 LAB — URINE CULTURE: Culture: >=100000 — AB

## 2020-05-20 LAB — PROTIME-INR
INR: 2.3 — ABNORMAL HIGH (ref 0.8–1.2)
Prothrombin Time: 24.6 seconds — ABNORMAL HIGH (ref 11.4–15.2)

## 2020-05-20 MED ORDER — CIPROFLOXACIN HCL 250 MG PO TABS
250.0000 mg | ORAL_TABLET | Freq: Two times a day (BID) | ORAL | Status: DC
  Administered 2020-05-20 – 2020-05-22 (×4): 250 mg via ORAL
  Filled 2020-05-20 (×4): qty 1

## 2020-05-20 MED ORDER — HYDRALAZINE HCL 10 MG PO TABS
10.0000 mg | ORAL_TABLET | Freq: Every day | ORAL | Status: DC
  Administered 2020-05-20: 10 mg via ORAL
  Filled 2020-05-20: qty 1

## 2020-05-20 MED ORDER — WARFARIN SODIUM 5 MG PO TABS
5.0000 mg | ORAL_TABLET | Freq: Once | ORAL | Status: AC
  Administered 2020-05-20: 5 mg via ORAL
  Filled 2020-05-20: qty 1

## 2020-05-20 NOTE — Progress Notes (Signed)
Physical Therapy Session Note  Patient Details  Name: [name] MRN: [id number] Date of Birth: 1933-12-08  Today's Date: 05/20/2020 PT Individual Time: 1540-0867 PT Individual Time Calculation (min): 70 min   Short Term Goals: Week 3:  PT Short Term Goal 1 (Week 3): STG = LTG due to ELOS  Skilled Therapeutic Interventions/Progress Updates:    Pt received sitting upright in w/c, awake and agreeable to therapy. Patient attempting to use the landline phone to call someone, obviously frustrated. Upon entry, she reports she is trying to get a hold of her husband, [name]. She reports that he just recently walked down to the hospital E.D. due to concerns of dizzy spells. Patient concerned for his well being - RN entering and updated patient on husband whereabouts, informing he is currently in the waiting room at ED waiting to be checked up. Patient thankful for updates and seems relieved. Therapist updated social worker on patient's husband status as patient's DC is upcoming but will require caregiver assist from her husband - will wait to find out details as they become known.  W/c transport for time management to day room gym. Sit<>stand with CGA to RW, ambulated ~35ft with close supervision and RW, antalgic gait but attempting to progress from step-to vs step-through gait pattern. Sit<>stand to high/low table with CGA. Maintained standing for 87minutes while she completed overhead reaching with soccer ball, required CGA for steadying while completing.  Completed the following standing there-ex with UE support to high table and therapist providing CGA for steadying: -2x10 LLE hip abduction -2x10 LLE hamstring curls -2x10 bilateral toe raises -2x10 LLE marches *Required seated rest break during these due to fatigue.  Pt quite distracted during therapy session as she remains concerned regarding husband's status. Therapeutic calming and active listening provided and patient was very thankful for this.  She ended session seated in w/c with needs in reach. Unable to locate her phone charger as her cell phone is dead. Notified RN who was going to provide her his cell phone number for patient to call via landline as she can't remember his phone number.   Therapy Documentation Precautions:  Precautions Precautions: Fall Restrictions Weight Bearing Restrictions: Yes LUE Weight Bearing: Weight bearing as tolerated LLE Weight Bearing: Weight bearing as tolerated  Therapy/Group: Individual Therapy  Demya Scruggs P Chynah Orihuela PT 05/20/2020, 7:36 AM

## 2020-05-20 NOTE — Progress Notes (Signed)
Patient ID: Atiyana Welte, female   DOB: [birth year], 87 y.o.   MRN: [id number] Met with pt who reports her husband has gone to the ER to be checked out due to experiencing dizziness and concerned. She reports this is unusual due to he brushes things off but he must be concerned. He is her caregiver will need to see what is going on with him and go from there. Pt reports their son is flying in form [place] on Thursday, but only staying for 5 days. Will need to see what is going on with husband. Pt worried about him.

## 2020-05-20 NOTE — Progress Notes (Signed)
Pharmacy Antibiotic Note  [name] is a 87 y.o. female admitted to [facility] on 04/29/2020. Noted to have recurrent UTIs - most recently with Pseudomonas on 2/19 culture and stated to be symptomatic. Pharmacy has been consulted for Ciprofloxacin dosing.  The patient was started on Levaquin on 2/21 - which also should count as coverage. Will adjust to Cipro as requested.   Plan: - Start Cipro 250 mg po bid - Pharmacy will sign off and monitor peripherally for dose adjustments and LOT plans  Height: 5\' 4"  (162.6 cm) Weight: 83.4 kg (183 lb 13.8 oz) IBW/kg (Calculated) : 54.7  Temp (24hrs), Avg:97.9 F (36.6 C), Min:97.7 F (36.5 C), Max:98 F (36.7 C)  Recent Labs  Lab 05/16/20 0510 05/19/20 0957  WBC  --  10.0  CREATININE 1.29*  --     Estimated Creatinine Clearance: 33.9 mL/min (A) (by C-G formula based on SCr of 1.29 mg/dL (H)).    Allergies  Allergen Reactions  . Pradaxa [Dabigatran Etexilate Mesylate] Other (See Comments)    INTERNAL BLEEDING  . Clindamycin/Lincomycin Other (See Comments)    PT STATES HER DOCTOR TOLD HER NOT TO TAKE CLINDAMYCIN BECAUSE SHE GOT C-DIFF AFTER TAKING AMPICILLIN- "tolerated in 2022" (per spouse)  . Latex Other (See Comments)    Blisters   . Ampicillin Other (See Comments)    C. Diff after taking ampicillin - "tolerated in 2022" (per husband) Pt has received cephalosporins in 2013, 2014, 2017, 2019, 2022   . Sulfamethoxazole Diarrhea    Antimicrobials this admission: Keflex 2/12 >> 2/19 Macrobid 2/21 >>  Levaquin 2/21 >> 2/22 Cipro 2/22 >>  Dose adjustments this admission: n/a  Microbiology results: 2/11 Ucx >100 K klebsiella pneumoniae (R to amp, Bactrim, INT Nitrofur) 2/19 UCx >> 100k PSA (pan-S)  Thank you for allowing pharmacy to be a part of this patient's care.  Alycia Rossetti, PharmD, BCPS Clinical Pharmacist Clinical phone for 05/20/2020: [phone] 05/20/2020 9:27 AM   **Pharmacist phone directory can now be found on  [email or web address] ([email or web address]).  Listed under [place].

## 2020-05-20 NOTE — Progress Notes (Signed)
Occupational Therapy Session Note  Patient Details  Name: Sydnee Lamour MRN: [id number] Date of Birth: 06-May-1933  Today's Date: 05/20/2020 OT Individual Time: 0830-0900 OT Individual Time Calculation (min): 30 min    Short Term Goals: Week 3:  OT Short Term Goal 1 (Week 3): STGs =LTGs due to ELOS  Skilled Therapeutic Interventions/Progress Updates:    Pt received supine requesting to get washed up and dressed, no c/o pain. Pt completed bed mobility with increased time with CGA and use of bed rail. Min A to stand from EOB as well as for stand pivot transfer to the w/c. Oral care with set up assist at the sink. Pt completed UB bathing with set up assist and min A to don shirt d/t it becoming twisted in the back. Pt completed sit > stand from w/c with CGA for LB bathing. Min A to don pants. Teds donned total A. Pt was left sitting up in the w/c with all needs met, husband present.   Therapy Documentation Precautions:  Precautions Precautions: Fall Restrictions Weight Bearing Restrictions: Yes LUE Weight Bearing: Weight bearing as tolerated LLE Weight Bearing: Weight bearing as tolerated   Therapy/Group: Individual Therapy  Curtis Sites 05/20/2020, 6:18 AM

## 2020-05-20 NOTE — Plan of Care (Signed)
  Problem: Consults Goal: RH STROKE PATIENT EDUCATION Description: See Patient Education module for education specifics  Outcome: Progressing   Problem: RH BOWEL ELIMINATION Goal: RH STG MANAGE BOWEL WITH ASSISTANCE Description: STG Manage Bowel with min Assistance. Outcome: Progressing   Problem: RH BLADDER ELIMINATION Goal: RH STG MANAGE BLADDER WITH ASSISTANCE Description: STG Manage Bladder With min Assistance Outcome: Progressing   Problem: RH SKIN INTEGRITY Goal: RH STG MAINTAIN SKIN INTEGRITY WITH ASSISTANCE Description: STG Maintain Skin Integrity With min Assistance. Outcome: Progressing   Problem: RH SAFETY Goal: RH STG ADHERE TO SAFETY PRECAUTIONS W/ASSISTANCE/DEVICE Description: STG Adhere to Safety Precautions With min Assistance/Device. Outcome: Progressing   Problem: RH PAIN MANAGEMENT Goal: RH STG PAIN MANAGED AT OR BELOW PT'S PAIN GOAL Outcome: Progressing   Problem: RH KNOWLEDGE DEFICIT Goal: RH STG INCREASE KNOWLEDGE OF HYPERTENSION Description: Patient and spouse will be able to manage HTN with medications and dietary modifications using handouts and educational materials with cues/reminders Outcome: Progressing Goal: RH STG INCREASE KNOWLEGDE OF HYPERLIPIDEMIA Description: Patient and spouse will be able to manage HLD  with medications and dietary modifications using handouts and educational materials with cues/reminders Outcome: Progressing Goal: RH STG INCREASE KNOWLEDGE OF STROKE PROPHYLAXIS Description: Patient and spouse will be able to manage secondary stroke risks/prophylaxis with medications and dietary modifications using handouts and educational materials with cues/reminders Outcome: Progressing   Problem: Consults Goal: RH STROKE PATIENT EDUCATION Description: See Patient Education module for education specifics  Outcome: Progressing

## 2020-05-20 NOTE — Progress Notes (Signed)
[facility] PHYSICAL MEDICINE & REHABILITATION PROGRESS NOTE  Subjective/Complaints: Patient seen sitting up in bed this morning.  She states she slept well overnight after receiving pain medications.  She has questions regarding UTI.  ROS: Denies CP, SOB, N/V/D  Objective: Vital Signs: Blood pressure (!) 175/74, pulse 67, temperature 98 F (36.7 C), resp. rate 18, height 5\' 4"  (1.626 m), weight 83.4 kg, SpO2 97 %. No results found. Recent Labs    05/19/20 0957  WBC 10.0  HGB 9.0*  HCT 27.9*  PLT 307   No results for input(s): NA, K, CL, CO2, GLUCOSE, BUN, CREATININE, CALCIUM in the last 72 hours.  Intake/Output Summary (Last 24 hours) at 05/20/2020 0902 Last data filed at 05/20/2020 0830 Gross per 24 hour  Intake 1440 ml  Output 1 ml  Net 1439 ml        Physical Exam: BP (!) 175/74 (BP Location: Right Arm)   Pulse 67   Temp 98 F (36.7 C)   Resp 18   Ht 5\' 4"  (1.626 m)   Wt 83.4 kg   SpO2 97%   BMI 31.56 kg/m   Constitutional: No distress . Vital signs reviewed. HENT: Normocephalic.  Atraumatic. Eyes: EOMI. No discharge. Cardiovascular: No JVD.  RRR. Respiratory: Normal effort.  No stridor.  Bilateral clear to auscultation. GI: Non-distended.  BS +. Skin: Warm and dry.  Intact. Psych: Normal mood.  Normal behavior. Musc: Left hip with edema and tenderness, stable Neuro: Alert Motor: B/l UE: 4+/5 proximal distal Right lower extremity: 4-4+/5 proximal to distal, some pain inhibition, unchanged Left extremity: Hip flexion 3-/5, knee extension 4-/5, ankle dorsiflexion 4-/5 with pain inhibition, unchanged HOH Right facial weakness, stable  Assessment/Plan: 1. Functional deficits which require 3+ hours per day of interdisciplinary therapy in a comprehensive inpatient rehab setting.  Physiatrist is providing close team supervision and 24 hour management of active medical problems listed below.  Physiatrist and rehab team continue to assess barriers to  discharge/monitor patient progress toward functional and medical goals   Care Tool:  Bathing    Body parts bathed by patient: Right arm,Left arm,Chest,Abdomen,Right upper leg,Left upper leg,Face,Front perineal area,Left lower leg,Right lower leg,Buttocks   Body parts bathed by helper: Buttocks,Right lower leg,Left lower leg     Bathing assist Assist Level: Minimal Assistance - Patient > 75%     Upper Body Dressing/Undressing Upper body dressing   What is the patient wearing?: Pull over shirt    Upper body assist Assist Level: Set up assist    Lower Body Dressing/Undressing Lower body dressing      What is the patient wearing?: Pants,Incontinence brief     Lower body assist Assist for lower body dressing: Moderate Assistance - Patient 50 - 74%     Toileting Toileting    Toileting assist Assist for toileting: Minimal Assistance - Patient > 75%     Transfers Chair/bed transfer  Transfers assist     Chair/bed transfer assist level: Minimal Assistance - Patient > 75%     Locomotion Ambulation   Ambulation assist   Ambulation activity did not occur: Safety/medical concerns  Assist level: Contact Guard/Touching assist Assistive device: Walker-rolling Max distance: 40'   Walk 10 feet activity   Assist  Walk 10 feet activity did not occur: Safety/medical concerns  Assist level: Contact Guard/Touching assist Assistive device: Walker-rolling   Walk 50 feet activity   Assist Walk 50 feet with 2 turns activity did not occur: Safety/medical concerns  Walk 150 feet activity   Assist Walk 150 feet activity did not occur: Safety/medical concerns         Walk 10 feet on uneven surface  activity   Assist Walk 10 feet on uneven surfaces activity did not occur: Safety/medical concerns         Wheelchair     Assist Will patient use wheelchair at discharge?: Yes Type of Wheelchair: Manual    Wheelchair assist level:  Supervision/Verbal cueing Max wheelchair distance: 55 ft    Wheelchair 50 feet with 2 turns activity    Assist        Assist Level: Supervision/Verbal cueing   Wheelchair 150 feet activity     Assist      Assist Level: Total Assistance - Patient < 25%   Medical Problem List and Plan: 1.  Decreased functional ability secondary to left intertrochanteric femur fracture as well as history of left femur fracture May 2019 status post IM nailing as well as removal of hardware left femur from previous fracture 04/23/2020.  Weightbearing as tolerated.  Hospital course complicated by postoperative small acute infarct right cerebellum, bilateral occipital parietal lobes in the high left precentral gyrus.  Continue CIR 2.  Antithrombotics: -DVT/anticoagulation: Chronic Coumadin INR therapeutic on 2/21             -antiplatelet therapy: n/a 3. Pain Management: Continue Oxycodone as needed (using), Robaxin as needed  See #17  Relatively controlled with meds on 2/22  Scheduled AM oxycodone at 0730 per pt/husband request so that she receives it as she begins to move with therapy 4. Mood: Provide emotional support             -antipsychotic agents: N/A 5. Neuropsych: This patient is ?fully capable of making decisions on her own behalf. 6.  Acute blood loss anemia.    Hemoglobin 9.0 on 2/21 7.  Atrial fibrillation.  Toprol-XL 25 mg daily.  Continue Coumadin.  Cardiac rate controlled  HR controlled on 2/17  Monitor with increased activity 8. Hypertension: Chlorthalidone on hold.    Losartan 25, increased to 50 on 2/9, increased on 2/15  Increased toprol to 37.5mg  daily  Hydralazine 10 mg nightly started on 2/22  Remains elevated overnight on 2/21   Vitals:   05/19/20 2030 05/20/20 0532  BP: (!) 159/65 (!) 175/74  Pulse: 67 67  Resp: 18 18  Temp: 97.7 F (36.5 C) 98 F (36.7 C)  SpO2: 99% 97%   9.  CKD stage III.  Baseline creatinine ?1.46.    Creatinine 1.29 on  2/18  Encourage fluids  Continue to monitor 10.  Hyperlipidemia: Lipitor 11. Recurrent UTIs.    Macrobid changed to Levaquin, changed to Cipro on 2/22 12. Skin/Wound Care: Routine skin checks 13. Fluids/Electrolytes/Nutrition: Routine in and outs 14. Very hard of hearing- talk to her LEFT ear.  15.  Chronic diarrhea: used Imodium 2 mg BID daily at home  16. Leg length discrepancy- L shorter than Right- husband to bring in appropriate lift shoes.  17. L shoulder bicipital tendinitis based on clinical exam  Continue Voltaren gel 2G QID 18.  Congestion  Flonase ordered  Resolved 19. GERD  Timing adjusted per patient/husband-husband would like medication at 715-discussed limitations in giving medication at an exact time on a consistent basis  Improved overall 20.  Sleep disturbance  Medications increased on 2/18  Trazodone 25 nightly as needed started on 2/18  Appears to be improving  LOS: 21 days A FACE TO FACE  EVALUATION WAS PERFORMED  [name] Lorie Phenix 05/20/2020, 9:02 AM

## 2020-05-20 NOTE — Progress Notes (Signed)
ANTICOAGULATION CONSULT NOTE - Follow Up Consult  Pharmacy Consult for Warfarin Indication: atrial fibrillation  Allergies  Allergen Reactions  . Pradaxa [Dabigatran Etexilate Mesylate] Other (See Comments)    INTERNAL BLEEDING  . Clindamycin/Lincomycin Other (See Comments)    PT STATES HER DOCTOR TOLD HER NOT TO TAKE CLINDAMYCIN BECAUSE SHE GOT C-DIFF AFTER TAKING AMPICILLIN- "tolerated in 2022" (per spouse)  . Latex Other (See Comments)    Blisters   . Ampicillin Other (See Comments)    C. Diff after taking ampicillin - "tolerated in 2022" (per husband) Pt has received cephalosporins in 2013, 2014, 2017, 2019, 2022   . Sulfamethoxazole Diarrhea    Patient Measurements: Height: 5\' 4"  (162.6 cm) Weight: 83.4 kg (183 lb 13.8 oz) IBW/kg (Calculated) : 54.7  Vital Signs: Temp: 98 F (36.7 C) (02/22 0532) BP: 175/74 (02/22 0532) Pulse Rate: 67 (02/22 0532)  Labs: Recent Labs    05/18/20 0510 05/19/20 0520 05/19/20 0957  HGB  --   --  9.0*  HCT  --   --  27.9*  PLT  --   --  307  LABPROT 28.5* 25.0*  --   INR 2.8* 2.4*  --     Estimated Creatinine Clearance: 33.9 mL/min (A) (by C-G formula based on SCr of 1.29 mg/dL (H)).  Assessment: 87 year old with PMH significant for PAF on warfarin, recurrent UTI, HTN, HLD admitted to [facility] 04/22/2020 with acute closed left intratrochanteric hip fracture.Patient developed worsening dysphagia, MRI was obtain which showed acute stroke. Warfarin held per neurology recommendation. Pharmacy consulted to restart Warfarin on 1/31.  INR was 2.3 upon admit. PTA warfarin dose is 5 mg daily EXCEPT for 7.5 mg on Tues/Thurs.  INR today remains therapeutic (INR 2.3 << 2.4, goal of 2-3). Will switch back to daily INR checks while on fluoroquinolones since these can increase warfarin sensitivity.   Goal of Therapy:  INR 2-3 Monitor platelets by anticoagulation protocol: Yes   Plan:  - Warfarin 5 mg x 1 dose at 1600 today - Daily  PT/INR while on flouroquinolones - Will continue to monitor for any signs/symptoms of bleeding and will follow up with PT/INR in the a.m.    Thank you for allowing pharmacy to be a part of this patient's care.  Alycia Rossetti, PharmD, BCPS Clinical Pharmacist Clinical phone for 05/20/2020: [phone] 05/20/2020 9:25 AM   **Pharmacist phone directory can now be found on [email or web address] ([email or web address]).  Listed under [place].

## 2020-05-20 NOTE — Progress Notes (Signed)
Occupational Therapy Session Note  Patient Details  Name: [name] MRN: [id number] Date of Birth: 1933-07-27  Today's Date: 05/20/2020 OT Individual Time: 0930-1030 OT Individual Time Calculation (min): 60 min    Short Term Goals: Week 3:  OT Short Term Goal 1 (Week 3): STGs =LTGs due to ELOS  Skilled Therapeutic Interventions/Progress Updates:   Pt received in w/c ready for therapy.  Pt taken to gym to focus on standing endurance, balance, ambulation with a focus on posture.  Pt stood up from wc using good technique and then placed hands on Rw.  Practiced releasing hand to challenge her static balance unsupported and then adding in arm movements for more dynamic challenge.   She worked on wt shifting from side to side, then placed mirror in front of pt several feet ahead to cue her to look at herself versus the floor. Kept moving mirror forward as she walked.  Pt ambulated 30 feet with only light CGA and then sat down in wc.   Worked on UE arm pulls for back strength using theraband and then added in sh abd to 45 degrees with resistance.   LB AROM of hip abd/add and L knee extension with support from theraband to lift leg and then pt resisted band as she moved back to knee flexion.    Pt participated very well today.  Pt is eager to go home on Thursday.   Pt taken back to room to rest. Seat alarm on and all needs met.   Therapy Documentation Precautions:  Precautions Precautions: Fall Restrictions Weight Bearing Restrictions: Yes LUE Weight Bearing: Weight bearing as tolerated LLE Weight Bearing: Weight bearing as tolerated    Vital Signs: Therapy Vitals Temp: 98 F (36.7 C) Pulse Rate: 67 Resp: 18 BP: (!) 175/74 Patient Position (if appropriate): Lying Oxygen Therapy SpO2: 97 % O2 Device: Room Air Pain: Pain Assessment Pain Scale: 0-10 Pain Score: 8  Pain Type: Surgical pain Pain Location: Hip Pain Orientation: Left Pain Radiating Towards: leg Pain  Intervention(s): Medication (See eMAR)    Therapy/Group: Individual Therapy  Halee Glynn 05/20/2020, 8:31 AM

## 2020-05-21 ENCOUNTER — Other Ambulatory Visit: Payer: Self-pay | Admitting: Physician Assistant

## 2020-05-21 LAB — BASIC METABOLIC PANEL
Anion gap: 12 (ref 5–15)
BUN: 21 mg/dL (ref 8–23)
CO2: 22 mmol/L (ref 22–32)
Calcium: 8.6 mg/dL — ABNORMAL LOW (ref 8.9–10.3)
Chloride: 104 mmol/L (ref 98–111)
Creatinine, Ser: 1.17 mg/dL — ABNORMAL HIGH (ref 0.44–1.00)
GFR, Estimated: 46 mL/min — ABNORMAL LOW (ref >60)
Glucose, Bld: 115 mg/dL — ABNORMAL HIGH (ref 70–99)
Potassium: 4 mmol/L (ref 3.5–5.1)
Sodium: 138 mmol/L (ref 135–145)

## 2020-05-21 LAB — PROTIME-INR
INR: 2.4 — ABNORMAL HIGH (ref 0.8–1.2)
Prothrombin Time: 25 seconds — ABNORMAL HIGH (ref 11.4–15.2)

## 2020-05-21 MED ORDER — METOPROLOL SUCCINATE ER 25 MG PO TB24: 37.5000 mg | ORAL_TABLET | Freq: Every day | ORAL | 0 refills | Status: DC

## 2020-05-21 MED ORDER — VITAMIN D3 25 MCG PO TABS: 1000.0000 [IU] | ORAL_TABLET | Freq: Every day | ORAL | 0 refills | Status: AC

## 2020-05-21 MED ORDER — ATORVASTATIN CALCIUM 20 MG PO TABS: 20.0000 mg | ORAL_TABLET | Freq: Every day | ORAL | 0 refills | Status: AC

## 2020-05-21 MED ORDER — HYDRALAZINE HCL 25 MG PO TABS
25.0000 mg | ORAL_TABLET | Freq: Every day | ORAL | Status: DC
  Administered 2020-05-21: 25 mg via ORAL
  Filled 2020-05-21: qty 1

## 2020-05-21 MED ORDER — ACETAMINOPHEN 325 MG PO TABS: 650.0000 mg | ORAL_TABLET | Freq: Four times a day (QID) | ORAL | Status: AC | PRN

## 2020-05-21 MED ORDER — LOSARTAN POTASSIUM 25 MG PO TABS: 75.0000 mg | ORAL_TABLET | Freq: Every day | ORAL | 0 refills | Status: DC

## 2020-05-21 MED ORDER — PANTOPRAZOLE SODIUM 40 MG PO TBEC: 40.0000 mg | DELAYED_RELEASE_TABLET | Freq: Every day | ORAL | 0 refills | Status: AC

## 2020-05-21 MED ORDER — METHOCARBAMOL 500 MG PO TABS: 500.0000 mg | ORAL_TABLET | Freq: Four times a day (QID) | ORAL | 0 refills | Status: DC | PRN

## 2020-05-21 MED ORDER — DICLOFENAC SODIUM 1 % EX GEL: 2.0000 g | Freq: Four times a day (QID) | CUTANEOUS | 0 refills | Status: DC

## 2020-05-21 MED ORDER — HYDRALAZINE HCL 10 MG PO TABS: 10.0000 mg | ORAL_TABLET | Freq: Every day | ORAL | 0 refills | Status: DC

## 2020-05-21 MED ORDER — OXYCODONE-ACETAMINOPHEN 5-325 MG PO TABS: 1.0000 | ORAL_TABLET | Freq: Four times a day (QID) | ORAL | 0 refills | Status: DC | PRN

## 2020-05-21 MED ORDER — VITAMIN D (ERGOCALCIFEROL) 1.25 MG (50000 UNIT) PO CAPS: 50000.0000 [IU] | ORAL_CAPSULE | ORAL | 0 refills | Status: DC

## 2020-05-21 MED ORDER — MELATONIN 3 MG PO TABS: 3.0000 mg | ORAL_TABLET | Freq: Every day | ORAL | 0 refills | Status: DC

## 2020-05-21 MED ORDER — WARFARIN SODIUM 5 MG PO TABS
5.0000 mg | ORAL_TABLET | Freq: Once | ORAL | Status: AC
  Administered 2020-05-21: 5 mg via ORAL
  Filled 2020-05-21: qty 1

## 2020-05-21 MED FILL — PANTOPRAZOLE SOD DR 40 MG T: 40 | 30 days supply | Qty: 30 | Fill #0

## 2020-05-21 MED FILL — METOPROLOL SUCCINATE ER 25: 25 | 30 days supply | Qty: 45 | Fill #0

## 2020-05-21 MED FILL — VITAMIN D3 25 MCG TABS: 25 | 30 days supply | Qty: 30 | Fill #0

## 2020-05-21 MED FILL — VIT D2 1.25 MG (50,000 UNIT: 1.25 MG | 28 days supply | Qty: 5 | Fill #0

## 2020-05-21 MED FILL — ATORVASTATIN CALCIUM 20 MG: 20 | 30 days supply | Qty: 30 | Fill #0

## 2020-05-21 MED FILL — MELATONIN 3 MG TABS: 3 | 30 days supply | Qty: 30 | Fill #0

## 2020-05-21 MED FILL — LOSARTAN POTASSIUM 25 MG TA: 25 | 30 days supply | Qty: 90 | Fill #0

## 2020-05-21 MED FILL — hydrALAZINE HCL 10 MG TABS: 10 | 30 days supply | Qty: 30 | Fill #0

## 2020-05-21 MED FILL — METHOCARBAMOL 500 MG TABS: 500 | 5 days supply | Qty: 30 | Fill #0

## 2020-05-21 MED FILL — DICLOFENAC SODIUM 1% GEL: 1 | 20 days supply | Qty: 200 | Fill #0

## 2020-05-21 MED FILL — OXYCODONE-APAP 5-325MG: 5-325 | 7 days supply | Qty: 30 | Fill #0

## 2020-05-21 NOTE — Progress Notes (Signed)
[facility] PHYSICAL MEDICINE & REHABILITATION PROGRESS NOTE  Subjective/Complaints: Patient seen sitting up in her chair this morning working with therapies.  She states she slept well overnight.  She notes improvement in strength.  She states she is doing forward to being discharged tomorrow.  Has been staffing with nursing station with questions regarding antibiotic prescription.  ROS: Denies CP, SOB, N/V/D  Objective: Vital Signs: Blood pressure (!) 182/63, pulse 67, temperature 98.1 F (36.7 C), temperature source Oral, resp. rate 18, height 5\' 4"  (1.626 m), weight 83.4 kg, SpO2 95 %. No results found. Recent Labs    05/19/20 0957  WBC 10.0  HGB 9.0*  HCT 27.9*  PLT 307   No results for input(s): NA, K, CL, CO2, GLUCOSE, BUN, CREATININE, CALCIUM in the last 72 hours.  Intake/Output Summary (Last 24 hours) at 05/21/2020 0947 Last data filed at 05/21/2020 0758 Gross per 24 hour  Intake 840 ml  Output --  Net 840 ml        Physical Exam: BP (!) 182/63   Pulse 67   Temp 98.1 F (36.7 C) (Oral)   Resp 18   Ht 5\' 4"  (1.626 m)   Wt 83.4 kg   SpO2 95%   BMI 31.56 kg/m   Constitutional: No distress . Vital signs reviewed. HENT: Normocephalic.  Atraumatic. Eyes: EOMI. No discharge. Cardiovascular: No JVD.  RRR. Respiratory: Normal effort.  No stridor.  Bilateral clear to auscultation. GI: Non-distended.  BS +. Skin: Warm and dry.  Intact. Psych: Normal mood.  Normal behavior. Musc: Left hip with edema and tenderness, unchanged Neuro: Alert Motor: B/l UE: 4+/5 proximal distal Right lower extremity: 4-4+/5 proximal to distal, some pain inhibition, unchanged Left extremity: Hip flexion 3/5, knee extension 4--4/5, ankle dorsiflexion 4-/5 with pain inhibition HOH Right facial weakness, stable  Assessment/Plan: 1. Functional deficits which require 3+ hours per day of interdisciplinary therapy in a comprehensive inpatient rehab setting.  Physiatrist is providing close  team supervision and 24 hour management of active medical problems listed below.  Physiatrist and rehab team continue to assess barriers to discharge/monitor patient progress toward functional and medical goals   Care Tool:  Bathing    Body parts bathed by patient: Right arm,Left arm,Chest,Abdomen,Right upper leg,Left upper leg,Face,Front perineal area,Left lower leg,Right lower leg,Buttocks   Body parts bathed by helper: Buttocks,Right lower leg,Left lower leg     Bathing assist Assist Level: Contact Guard/Touching assist     Upper Body Dressing/Undressing Upper body dressing   What is the patient wearing?: Pull over shirt    Upper body assist Assist Level: Minimal Assistance - Patient > 75%    Lower Body Dressing/Undressing Lower body dressing      What is the patient wearing?: Pants,Incontinence brief     Lower body assist Assist for lower body dressing: Minimal Assistance - Patient > 75%     Toileting Toileting    Toileting assist Assist for toileting: Contact Guard/Touching assist     Transfers Chair/bed transfer  Transfers assist     Chair/bed transfer assist level: Supervision/Verbal cueing     Locomotion Ambulation   Ambulation assist   Ambulation activity did not occur: Safety/medical concerns  Assist level: Contact Guard/Touching assist Assistive device: Walker-rolling Max distance: 40'   Walk 10 feet activity   Assist  Walk 10 feet activity did not occur: Safety/medical concerns  Assist level: Contact Guard/Touching assist Assistive device: Walker-rolling   Walk 50 feet activity   Assist Walk 50 feet with  2 turns activity did not occur: Safety/medical concerns         Walk 150 feet activity   Assist Walk 150 feet activity did not occur: Safety/medical concerns         Walk 10 feet on uneven surface  activity   Assist Walk 10 feet on uneven surfaces activity did not occur: Safety/medical concerns          Wheelchair     Assist Will patient use wheelchair at discharge?: Yes Type of Wheelchair: Manual    Wheelchair assist level: Supervision/Verbal cueing Max wheelchair distance: 55 ft    Wheelchair 50 feet with 2 turns activity    Assist        Assist Level: Supervision/Verbal cueing   Wheelchair 150 feet activity     Assist      Assist Level: Total Assistance - Patient < 25%   Medical Problem List and Plan: 1.  Decreased functional ability secondary to left intertrochanteric femur fracture as well as history of left femur fracture May 2019 status post IM nailing as well as removal of hardware left femur from previous fracture 04/23/2020.  Weightbearing as tolerated.  Hospital course complicated by postoperative small acute infarct right cerebellum, bilateral occipital parietal lobes in the high left precentral gyrus.  Continue CIR, patient and family education  Team conference today to discuss current and goals and coordination of care, home and environmental barriers, and discharge planning with nursing, case manager, and therapies. Please see conference note from today as well.  2.  Antithrombotics: -DVT/anticoagulation: Chronic Coumadin INR therapeutic on 2/23             -antiplatelet therapy: n/a 3. Pain Management: Continue Oxycodone as needed (using), Robaxin as needed  See #17  Relatively controlled with meds on 2/23  Scheduled AM oxycodone at 0730 per pt/husband request so that she receives it as she begins to move with therapy 4. Mood: Provide emotional support             -antipsychotic agents: N/A 5. Neuropsych: This patient is ?fully capable of making decisions on her own behalf. 6.  Acute blood loss anemia.    Hemoglobin 9.0 on 2/21 7.  Atrial fibrillation.  Toprol-XL 25 mg daily.  Continue Coumadin.  Cardiac rate controlled  HR controlled on 2/17  Monitor with increased activity 8. Hypertension: Chlorthalidone on hold.    Losartan 25, increased  to 50 on 2/9, increased on 2/15  Increased toprol to 37.5mg  daily  Hydralazine 10 mg nightly started on 2/22, increased on 2/23  Remains elevated overnight on 2/23   Vitals:   05/20/20 1934 05/21/20 0456  BP: (!) 170/65 (!) 182/63  Pulse: 68 67  Resp: 18 18  Temp: 98 F (36.7 C) 98.1 F (36.7 C)  SpO2: 96% 95%   9.  CKD stage III.  Baseline creatinine ?1.46.    Creatinine 1.29 on 2/18, labs ordered  Encourage fluids  Continue to monitor 10.  Hyperlipidemia: Lipitor 11. Recurrent UTIs.    Macrobid changed to Levaquin, changed to ciprofloxacin on 2/22 12. Skin/Wound Care: Routine skin checks 13. Fluids/Electrolytes/Nutrition: Routine in and outs 14. Very hard of hearing- talk to her LEFT ear.  15.  Chronic diarrhea: used Imodium 2 mg BID daily at home  16. Leg length discrepancy- L shorter than Right- husband to bring in appropriate lift shoes.  17. L shoulder bicipital tendinitis based on clinical exam  Continue Voltaren gel 2G QID 18.  Congestion  Flonase ordered  Resolved 19. GERD  Timing adjusted per patient/husband-husband would like medication at 715-discussed limitations in giving medication at an exact time on a consistent basis  Improved overall 20.  Sleep disturbance  Medications increased on 2/18  Trazodone 25 nightly as needed started on 2/18  Improving  LOS: 22 days A FACE TO FACE EVALUATION WAS PERFORMED  [place]ola Mittman Lorie Phenix 05/21/2020, 9:47 AM

## 2020-05-21 NOTE — Discharge Summary (Signed)
Physical Therapy Discharge Summary  Patient Details  Name: [name] MRN: [id number] Date of Birth: Sep 27, 1933  Today's Date: 05/21/2020 PT Individual Time: 1000-1054 +  1131-1158 PT Individual Time Calculation (min): 54 min + 27 min  Patient has met 8 of 8 long term goals due to improved activity tolerance, improved balance, improved postural control, increased strength, increased range of motion, decreased pain, ability to compensate for deficits and functional use of  left lower extremity.  Patient to discharge at an ambulatory level Supervision.   Patient's care partner is independent to provide the necessary physical and cognitive assistance at discharge.  Reasons goals not met: n/a  Recommendation:  Patient will benefit from ongoing skilled PT services in home health setting to continue to advance safe functional mobility, address ongoing impairments in LLE weakness, balance deficits, gait impairments, and general deconditioning in order to minimize fall risk.  Equipment: No equipment provided. Pt owns a w/c and RW at home  Reasons for discharge: treatment goals met and discharge from hospital  Patient/family agrees with progress made and goals achieved: Yes  PT Discharge Precautions/Restrictions Precautions Precautions: Fall Restrictions Weight Bearing Restrictions: No LLE Weight Bearing: Weight bearing as tolerated Vision/Perception  Perception Perception: Within Functional Limits Praxis Praxis: Intact  Cognition Overall Cognitive Status: Within Functional Limits for tasks assessed Arousal/Alertness: Awake/alert Orientation Level: Oriented X4 Attention: Sustained;Focused Focused Attention: Appears intact Sustained Attention: Appears intact Problem Solving: Appears intact Reasoning: Appears intact Safety/Judgment: Appears intact Sensation Sensation Light Touch: Appears Intact Hot/Cold: Appears Intact Proprioception: Appears Intact Stereognosis: Appears  Intact Coordination Gross Motor Movements are Fluid and Coordinated: No Fine Motor Movements are Fluid and Coordinated: No Coordination and Movement Description: Pain inhibition from hip fx - significantly improved since date of evaluation Motor  Motor Motor: Other (comment) Motor - Discharge Observations: pain inhibition and effortful movement patterns - improved since date of evaluation  Mobility Bed Mobility Bed Mobility: Rolling Right;Rolling Left;Supine to Sit;Sit to Supine Rolling Right: Minimal Assistance - Patient > 75% Rolling Left: Minimal Assistance - Patient > 75% Supine to Sit: Minimal Assistance - Patient > 75% Sit to Supine: Moderate Assistance - Patient 50-74% Transfers Transfers: Sit to Stand;Stand to Sit;Stand Pivot Transfers Sit to Stand: Supervision/Verbal cueing Sit to Stand Comment: Needs extra time but able to do it! Stand to Sit: Supervision/Verbal cueing Stand Pivot Transfers: Supervision/Verbal cueing Stand Pivot Transfer Details: Verbal cues for precautions/safety;Verbal cues for technique;Verbal cues for sequencing;Verbal cues for gait pattern;Verbal cues for safe use of DME/AE Transfer (Assistive device): Rolling walker  Locomotion  Gait Ambulation: Yes Gait Assistance: Supervision/Verbal cueing Gait Distance (Feet): 35 Feet Assistive device: Rolling walker Gait Assistance Details: Verbal cues for gait pattern;Verbal cues for precautions/safety;Verbal cues for technique;Verbal cues for sequencing Gait Gait: Yes Gait Pattern: Impaired Gait Pattern: Step-to pattern;Decreased step length - left;Decreased stance time - left;Decreased step length - right;Decreased hip/knee flexion - left;Decreased weight shift to left;Left hip hike;Antalgic;Trunk flexed Gait velocity: decreased Stairs / Additional Locomotion Stairs: No Architect: Yes Wheelchair Assistance: Chartered loss adjuster: Both upper  extremities Wheelchair Parts Management: Needs assistance Distance: ~155f  Trunk/Postural Assessment  Cervical Assessment Cervical Assessment: [facility] to [facility](Significant forward head carriage) Thoracic Assessment Thoracic Assessment: Exceptions to [facility](significant kyphoic posture and rounded shoulders) Lumbar Assessment Lumbar Assessment: Exceptions to [facility](posterior pelvic tilt) Postural Control Postural Control: Within Functional Limits  Balance Balance Balance Assessed: Yes Static Sitting Balance Static Sitting - Balance Support: Feet supported Static Sitting - Level of  Assistance: 7: Independent Static Standing Balance Static Standing - Balance Support: Bilateral upper extremity supported Static Standing - Level of Assistance: 5: Stand by assistance Extremity Assessment      RLE Assessment RLE Assessment: Within Functional Limits LLE Assessment LLE Assessment: Exceptions to [facility] Strength Comments: Ankle DF 4-/5, knee ext 3+/5, hip flex 2/5 (pain limiting)  Skilled Intervention:  1st session: Pt received sitting upright in w/c, agreeable to therapy. No reports of pain. Husband at bedside and patient relieved that he is feeling better since yesterday's E.D. visit. W/c transport to main rehab gym for time management. She then propelled herself ~157f on level surfaces with supervision using BUE's only within her w/c - demo's poor stroke efficiency and very slowed speed. Performed car transfer with minA and RW to car height set to simulate sedan. She required minA for her LE management. Ambulated up/down ~181framp with close supervision and RW, needing cues for safety awareness and RW management. Education provided regarding home safety training, DME rec's, and role of f/u therapies. Also developed individualized HEP with patient that is written below and handout provided. Pt returned to her room with totalA in her w/c and then reported need to void. Due to time  constraints, handoff of care to NT to assist with toileting needs. Pt ended session seated in w/c with NT present in room.   Access Code: [email or web address] URL: [email or web address] Date: 05/21/2020 Prepared by: ChGinnie SmartExercises Standing Hip Abduction with Counter Support - 1 x daily - 7 x weekly - 3 sets - 10 reps Heel rises with counter support - 1 x daily - 7 x weekly - 3 sets - 10 reps Standing Knee Flexion - 1 x daily - 7 x weekly - 3 sets - 10 reps Standing March with Counter Support - 1 x daily - 7 x weekly - 3 sets - 10 reps Sit to Stand with Counter Support - 1 x daily - 7 x weekly - 3 sets - 10 reps Seated Long Arc Quad - 1 x daily - 7 x weekly - 3 sets - 10 reps Seated March with Same Side Arm Raise - 1 x daily - 7 x weekly - 3 sets - 10 reps   2nd session: Pt greeted sitting upright in w/c, agreeable to therapy. No reports of pain. Husband not at bedside. Focus of session to prep patient for upcoming DC tomorrow. Extensive and lengthy conversation regarding tips/strategies for home safety training (removal of throw rugs, well-light hallways/bathrooms with night lights, etc). All questions and concerns addressed and patient appreciative of education. Sit<>stand with close supervision to RW from w/c height. Ambulated ~3033fith close supervision and RW, continues to demo antalgic gait pattern with forward flexed trunk and decreased L weight shift however no knee buckling or LOB noted. Able to perform stand>sit with close supervision and RW to her w/c. She ended session seated in w/c with needs in reach, patient very appreciative of therapy services during her rehab stay.  Kirstan Fentress P Doralene Glanz PT 05/21/2020, 7:59 AM

## 2020-05-21 NOTE — Patient Care Conference (Signed)
Inpatient RehabilitationTeam Conference and Plan of Care Update Date: 05/21/2020   Time: 11:08 AM    Patient Name: [name]      Medical Record Number: [id number]  Date of Birth: [birth year] Sex: Female         Room/Bed: 4W23C/4W23C-01 Payor Info: Payor: MEDICARE / Plan: MEDICARE PART A AND B / Product Type: *No Product type* /    Admit Date/Time:  04/29/2020  4:16 PM  Primary Diagnosis:  Intertrochanteric fracture of left hip [facility])  Hospital Problems: Principal Problem:   Intertrochanteric fracture of left hip (HCC) Active Problems:   Pressure injury of skin   Congestion of nasal sinus   Stage 3 chronic kidney disease (HCC)   Benign essential HTN   Labile blood pressure   Anemia, chronic disease   Essential hypertension   PAF (paroxysmal atrial fibrillation) (HCC)   Post-operative pain   Gastroesophageal reflux disease   Recurrent UTI   Sleep disturbance    Expected Discharge Date: Expected Discharge Date: 05/22/20  Team Members Present: Physician leading conference: Dr. Delice Lesch Care Coodinator Present: Dorien Chihuahua, RN, BSN, CRRN;Becky Dupree, LCSW Nurse Present: Other (comment) Lavonna Rua, Mitzi Hansen, RN) PT Present: Ginnie Smart, PT OT Present: Meriel Pica, OT SLP Present: Charolett Bumpers, SLP PPS Coordinator present : Gunnar Fusi, SLP     Current Status/Progress Goal Weekly Team Focus  Bowel/Bladder             Swallow/Nutrition/ Hydration             ADL's   CGA to supervision with most self care tasks, transfers, standing - on track to meeting goals.  upgrade LTGs to CGA-supervision due to pt making steady progress  continued pt/family education to prepare for discharge, functional mobility training with ADL training   Mobility   supervision sit<>stand to RW, supervision gait up to ~3ft with RW  CGA  LLE pain management, functional transfers, gait training, LLE strengthening, DC planning.   Communication             Safety/Cognition/  Behavioral Observations            Pain             Skin               Discharge Planning:  Husband went to ER yesterday but not admitted, here now. will make sure all education complete prior to DC tomorrow   Team Discussion: Husband is better today and feels able to manage patient care. Patient is ready for discharge after good improvements with function noted over the past week.  Patient on target to meet rehab goals: yes, on target to meet goals  *See Care Plan and progress notes for long and short-term goals.   Revisions to Treatment Plan:   Teaching Needs:   Current Barriers to Discharge: Decreased caregiver support, Home enviroment access/layout and Weight bearing restrictions  Possible Resolutions to Barriers: Family education completed     Medical Summary Current Status: Decreased functional ability secondary to left intertrochanteric femur fracture as well as history of left femur fracture May 2019 status post IM nailing as well as removal of hardware left femur from previous fracture 04/23/2020.  Weightbearing as tolerated.  Hospital course complicated by postoperative small acute infarct right cerebellum, bilateral occipital parietal lobes in the high left precentral gyrus.  Barriers to Discharge: Medical stability;Decreased family/caregiver support;Weight;Behavior;Home enviroment access/layout   Possible Resolutions to [employer] Focus: Therapies, optimize BP meds, abx for recurrent UTI,  family and pt edu   Continued Need for Acute Rehabilitation Level of Care: The patient requires daily medical management by a physician with specialized training in physical medicine and rehabilitation for the following reasons: Direction of a multidisciplinary physical rehabilitation program to maximize functional independence : Yes Medical management of patient stability for increased activity during participation in an intensive rehabilitation regime.: Yes Analysis of  laboratory values and/or radiology reports with any subsequent need for medication adjustment and/or medical intervention. : Yes   I attest that I was present, lead the team conference, and concur with the assessment and plan of the team.   Margarito Liner 05/21/2020, 3:35 PM

## 2020-05-21 NOTE — Progress Notes (Signed)
Patient ID: [name], female   DOB: [birth year], 87 y.o.   MRN: [id number]  Met with pt and husband to inform team conference and plan for discharge tomorrow. Goals met and both feel ready for tomorrow.

## 2020-05-21 NOTE — Progress Notes (Signed)
ANTICOAGULATION CONSULT NOTE - Follow Up Consult  Pharmacy Consult for Warfarin Indication: atrial fibrillation  Allergies  Allergen Reactions  . Pradaxa [Dabigatran Etexilate Mesylate] Other (See Comments)    INTERNAL BLEEDING  . Clindamycin/Lincomycin Other (See Comments)    PT STATES HER DOCTOR TOLD HER NOT TO TAKE CLINDAMYCIN BECAUSE SHE GOT C-DIFF AFTER TAKING AMPICILLIN- "tolerated in 2022" (per spouse)  . Latex Other (See Comments)    Blisters   . Ampicillin Other (See Comments)    C. Diff after taking ampicillin - "tolerated in 2022" (per husband) Pt has received cephalosporins in 2013, 2014, 2017, 2019, 2022   . Sulfamethoxazole Diarrhea    Patient Measurements: Height: 5\' 4"  (162.6 cm) Weight: 83.4 kg (183 lb 13.8 oz) IBW/kg (Calculated) : 54.7  Vital Signs: Temp: 98.1 F (36.7 C) (02/23 0456) Temp Source: Oral (02/23 0456) BP: 182/63 (02/23 0456) Pulse Rate: 67 (02/23 0456)  Labs: Recent Labs    05/19/20 0520 05/19/20 0957 05/20/20 1044 05/21/20 0556  HGB  --  9.0*  --   --   HCT  --  27.9*  --   --   PLT  --  307  --   --   LABPROT 25.0*  --  24.6* 25.0*  INR 2.4*  --  2.3* 2.4*    Estimated Creatinine Clearance: 33.9 mL/min (A) (by C-G formula based on SCr of 1.29 mg/dL (H)).  Assessment: 87 year old with PMH significant for PAF on warfarin, recurrent UTI, HTN, HLD admitted to [facility] 04/22/2020 with acute closed left intratrochanteric hip fracture.Patient developed worsening dysphagia, MRI was obtain which showed acute stroke. Warfarin held per neurology recommendation. Pharmacy consulted to restart Warfarin on 1/31.  INR was 2.3 upon admit. PTA warfarin dose is 5 mg daily EXCEPT for 7.5 mg on Tues/Thurs.  INR today remains therapeutic (INR 2.4, goal of 2-3). Switched back to daily INR checks while on fluoroquinolones since these can increase warfarin sensitivity.   Goal of Therapy:  INR 2-3 Monitor platelets by anticoagulation protocol:  Yes   Plan:  - Warfarin 5 mg x 1 dose at 1600 today - Daily PT/INR while on flouroquinolones - Will continue to monitor for any signs/symptoms of bleeding and will follow up with PT/INR in the a.m.    Thank you for allowing pharmacy to be a part of this patient's care.  Erin Hearing PharmD., BCPS Clinical Pharmacist 05/21/2020 9:17 AM

## 2020-05-21 NOTE — Progress Notes (Signed)
Patient ID: [name], female   DOB: 05/24/1933, 87 y.o.   MRN: [id number] Saw husband when coming in to see pt and he voiced feeling much better today and was not admitted in the ER yesterday. He feels ready to take wife home tomorrow. Finish up education in preparation of discharge tomorrow.

## 2020-05-21 NOTE — Progress Notes (Signed)
Occupational Therapy Discharge Summary  Patient Details  Name: [name] MRN: [id number] Date of Birth: [birth year]  Today's Date: 05/21/2020 OT Individual Time: 1300-1330 OT Individual Time Calculation (min): 30 min    Patient has met 7 of 10 long term goals due to improved activity tolerance, improved balance, postural control, ability to compensate for deficits and improved coordination.  Patient to discharge at overall Supervision to min assist level.  Patient's care partner is independent to provide the necessary physical and cognitive assistance at discharge.    Reasons goals not met: Pt requires assist with UB and LB dressing as well as pt needs increased assist in AM for functional mobility due to pain and stiffness in LLE that improves as day progresses.  Pts husband has demonstrated ability to assist pt as needed with self care and mobility.  Recommendation:  Patient will benefit from ongoing skilled OT services in home health setting to continue to advance functional skills in the area of BADL.  Equipment: BSC  Reasons for discharge: treatment goals met and discharge from hospital   Skilled Intervention: First session:  Pt semiupright in bed, requesting to bathe at sink and use bathroom during OT session.  Pt completed supine to sit with supervision; stand pivot EOB to w/c with CGA and increased time using RW.  Pt transported to bathroom and completed 3 in 1 commode transfer and toileting with CGA.  Pt transported to sinkside and completed UB/LB bathing and dressing requiring min assist overall.  Pts husband arrived and able to provide necessary total assist to donn pts TED hose and shoes safely.  Call bell in reach, seat alarm on.  Second session: Pt requesting to use bathroom, reporting stiffness in left hip this session.  Pt ambulated using RW to bathroom with CGA and VCs for safe RW mgt.  Pt completed toilet transfer with CGA and toileting with min assist.  Pt ambulated to  sink with CGA and washed hands with supervision.  Stand pivot back to w/c with CGA and VCs for safe approach to prevent premature sitting.  Call bell in reach, seat alarm on.     Patient/family agrees with progress made and goals achieved: Yes    OT Discharge Precautions/Restrictions  Precautions Precautions: Fall Restrictions Weight Bearing Restrictions: No LUE Weight Bearing: Weight bearing as tolerated LLE Weight Bearing: Weight bearing as tolerated Pain Pain Assessment Pain Scale: Faces Pain Score: 3  ADL ADL Eating: Set up Where Assessed-Eating: Wheelchair Grooming: Setup Where Assessed-Grooming: Sitting at sink Upper Body Bathing: Supervision/safety Where Assessed-Upper Body Bathing: Shower Lower Body Bathing: Contact guard Where Assessed-Lower Body Bathing: Shower Upper Body Dressing: Minimal assistance Where Assessed-Upper Body Dressing: Wheelchair Lower Body Dressing: Minimal assistance Where Assessed-Lower Body Dressing: Sitting at sink,Standing at sink Toileting: Contact guard Where Assessed-Toileting: Medical laboratory scientific officer: Close supervision Toilet Transfer Method: TEFL teacher: Energy manager: Curator Method: Heritage manager: Grab bars Vision Baseline Vision/History: Wears glasses Wears Glasses: At all times Patient Visual Report: No change from baseline Vision Assessment?: No apparent visual deficits Perception  Perception: Within Functional Limits Praxis Praxis: Intact Cognition Overall Cognitive Status: Within Functional Limits for tasks assessed Arousal/Alertness: Awake/alert Problem Solving: Appears intact Reasoning: Appears intact Safety/Judgment: Appears intact Sensation Sensation Light Touch: Appears Intact Hot/Cold: Appears Intact Proprioception: Appears Intact Stereognosis: Not tested Coordination Gross Motor Movements are  Fluid and Coordinated: No Fine Motor Movements are Fluid and Coordinated: Yes Coordination and Movement Description: Pain  inhibition from hip fx - significantly improved since date of evaluation Motor  Motor Motor: Other (comment) Motor - Discharge Observations: pain inhibition and effortful movement patterns - improved since date of evaluation Mobility  Bed Mobility Bed Mobility: Rolling Right;Rolling Left;Supine to Sit;Sit to Supine Rolling Right: Minimal Assistance - Patient > 75% Rolling Left: Minimal Assistance - Patient > 75% Supine to Sit: Minimal Assistance - Patient > 75% Sit to Supine: Moderate Assistance - Patient 50-74% Transfers Sit to Stand: Supervision/Verbal cueing Stand to Sit: Supervision/Verbal cueing  Trunk/Postural Assessment  Cervical Assessment Cervical Assessment: Exceptions to [facility] (forward head) Thoracic Assessment Thoracic Assessment:  (kyphotic) Lumbar Assessment Lumbar Assessment:  (posterior pelvic tilt) Postural Control Postural Control: Within Functional Limits  Balance Balance Balance Assessed: Yes Static Sitting Balance Static Sitting - Level of Assistance: 7: Independent Static Standing Balance Static Standing - Level of Assistance: 5: Stand by assistance Extremity/Trunk Assessment RUE Assessment RUE Assessment: Within Functional Limits LUE Assessment LUE Assessment: Within Functional Limits   [name] L [name] 05/21/2020, 2:22 PM

## 2020-05-22 ENCOUNTER — Other Ambulatory Visit: Payer: Self-pay | Admitting: Physician Assistant

## 2020-05-22 LAB — PROTIME-INR
INR: 2.5 — ABNORMAL HIGH (ref 0.8–1.2)
Prothrombin Time: 26.3 seconds — ABNORMAL HIGH (ref 11.4–15.2)

## 2020-05-22 MED ORDER — WARFARIN SODIUM 5 MG PO TABS: 5.0000 mg | ORAL_TABLET | Freq: Every day | ORAL | 11 refills | Status: AC

## 2020-05-22 MED ORDER — HYDRALAZINE HCL 25 MG PO TABS: 25.0000 mg | ORAL_TABLET | Freq: Every day | ORAL | 0 refills | Status: DC

## 2020-05-22 MED ORDER — CIPROFLOXACIN HCL 250 MG PO TABS: 250.0000 mg | ORAL_TABLET | Freq: Two times a day (BID) | ORAL | 0 refills | Status: DC

## 2020-05-22 MED FILL — hydrALAZINE HCL 25 MG TABS: 25 | 30 days supply | Qty: 30 | Fill #0

## 2020-05-22 MED FILL — WARFARIN SODIUM 5 MG TABLET: 5 | 30 days supply | Qty: 30 | Fill #0

## 2020-05-22 MED FILL — CIPROFLOXACIN HCL 250 MG TA: 250 | 5 days supply | Qty: 10 | Fill #0

## 2020-05-22 NOTE — Progress Notes (Signed)
Patient discharged off of unit with all belongings. Discharge papers/instructions explained by physician assistant to family. Patient and family have no further questions at time of discharge. No complications noted at this time.  Joslyne Marshburn L Brissa Asante  

## 2020-05-22 NOTE — Discharge Instructions (Signed)
Inpatient Rehab Discharge Instructions  Jaryiah Mehlman Discharge date and time: No discharge date for patient encounter.   Activities/Precautions/ Functional Status: Activity: activity as tolerated Diet: regular diet Wound Care: Routine skin checks Functional status:  ___ No restrictions     ___ Walk up steps independently ___ 24/7 supervision/assistance   ___ Walk up steps with assistance ___ Intermittent supervision/assistance  ___ Bathe/dress independently ___ Walk with walker     _x__ Bathe/dress with assistance ___ Walk Independently    ___ Shower independently ___ Walk with assistance    ___ Shower with assistance ___ No alcohol     ___ Return to work/school ________  Special Instructions:  No driving smoking or alcohol  Home health nurse to check INR on 05/26/2020   results to [facility] phone [phone] fax [phone]   COMMUNITY REFERRALS UPON DISCHARGE:    Home Health:   PT, OT, RN, [place] Equipment/Items Ordered: 3 IN 1                                                             [facility]  [id number]                                                   STROKE/TIA DISCHARGE INSTRUCTIONS SMOKING Cigarette smoking nearly doubles your risk of having a stroke & is the single most alterable risk factor  If you smoke or have smoked in the last 12 months, you are advised to quit smoking for your health.  Most of the excess cardiovascular risk related to smoking disappears within a year of stopping.  Ask you doctor about anti-smoking medications  [place] Quit Line: 1-800-QUIT NOW  Free Smoking Cessation Classes [phone]  CHOLESTEROL Know your levels; limit fat & cholesterol in your diet  Lipid Panel     Component Value Date/Time   CHOL 100 04/28/2020 0220   TRIG 129 04/28/2020 0220   HDL 36 (L) 04/28/2020 0220   CHOLHDL 2.8 04/28/2020 0220   VLDL 26  04/28/2020 0220   LDLCALC 38 04/28/2020 0220      Many patients benefit from treatment even if their cholesterol is at goal.  Goal: Total Cholesterol (CHOL) less than 160  Goal:  Triglycerides (TRIG) less than 150  Goal:  HDL greater than 40  Goal:  LDL (LDLCALC) less than 100   BLOOD PRESSURE American Stroke Association blood pressure target is less that 120/80 mm/Hg  Your discharge blood pressure is:  BP: (!) 160/96  Monitor your blood pressure  Limit your salt and alcohol intake  Many individuals will require more than one medication for high blood pressure  DIABETES (A1c is a blood sugar average for last 3 months) Goal HGBA1c is under 7% (HBGA1c is blood sugar average for last 3 months)  Diabetes: No known diagnosis of diabetes    Lab Results  Component Value Date   HGBA1C 5.6 04/28/2020     Your HGBA1c can be lowered with medications, healthy diet, and exercise.  Check your blood sugar as directed by your physician  Call your physician if you experience unexplained or low blood sugars.  PHYSICAL ACTIVITY/REHABILITATION Goal is 30 minutes at least 4 days per week  Activity: Increase activity slowly, Therapies: Physical Therapy: Home Health Return to work:   Activity decreases your risk of heart attack and stroke and makes your heart stronger.  It helps control your weight and blood pressure; helps you relax and can improve your mood.  Participate in a regular exercise program.  Talk with your doctor about the best form of exercise for you (dancing, walking, swimming, cycling).  DIET/WEIGHT Goal is to maintain a healthy weight  Your discharge diet is:  Diet Order            Diet regular Room service appropriate? Yes; Fluid consistency: Thin  Diet effective now                 liquids Your height is:  Height: 5\' 4"  (162.6 cm) Your current weight is: Weight: 85.5 kg Your Body Mass Index (BMI) is:  BMI (Calculated): 32.34  Following the type of diet  specifically designed for you will help prevent another stroke.  Your goal weight range is:    Your goal Body Mass Index (BMI) is 19-24.  Healthy food habits can help reduce 3 risk factors for stroke:  High cholesterol, hypertension, and excess weight.  RESOURCES Stroke/Support Group:  Call [phone]   STROKE EDUCATION PROVIDED/REVIEWED AND GIVEN TO PATIENT Stroke warning signs and symptoms How to activate emergency medical system (call 911). Medications prescribed at discharge. Need for follow-up after discharge. Personal risk factors for stroke. Pneumonia vaccine given:  Flu vaccine given:  My questions have been answered, the writing is legible, and I understand these instructions.  I will adhere to these goals & educational materials that have been provided to me after my discharge from the hospital.     My questions have been answered and I understand these instructions. I will adhere to these goals and the provided educational materials after my discharge from the hospital.  Patient/Caregiver Signature _______________________________ Date __________  Clinician Signature _______________________________________ Date __________  Please bring this form and your medication list with you to all your follow-up doctor's appointments.

## 2020-05-22 NOTE — Progress Notes (Signed)
[facility] PHYSICAL MEDICINE & REHABILITATION PROGRESS NOTE  Subjective/Complaints: Patient seen sitting up in her chair this Am.  She states she slept well overnight.  Husband at bedside.  They are ready for discharge.   ROS: Denies CP, SOB, N/V/D  Objective: Vital Signs: Blood pressure (!) 180/88, pulse 74, temperature 98 F (36.7 C), resp. rate 19, height 5\' 4"  (1.626 m), weight 84.6 kg, SpO2 93 %. No results found. No results for input(s): WBC, HGB, HCT, PLT in the last 72 hours. Recent Labs    05/21/20 1124  NA 138  K 4.0  CL 104  CO2 22  GLUCOSE 115*  BUN 21  CREATININE 1.17*  CALCIUM 8.6*    Intake/Output Summary (Last 24 hours) at 05/22/2020 1606 Last data filed at 05/22/2020 0819 Gross per 24 hour  Intake 960 ml  Output --  Net 960 ml        Physical Exam: BP (!) 180/88   Pulse 74   Temp 98 F (36.7 C)   Resp 19   Ht 5\' 4"  (1.626 m)   Wt 84.6 kg   SpO2 93%   BMI 32.01 kg/m   Constitutional: No distress . Vital signs reviewed. HENT: Normocephalic.  Atraumatic. Eyes: EOMI. No discharge. Cardiovascular: No JVD.   Respiratory: Normal effort.  No stridor.   GI: Non-distended.   Skin: Warm and dry.  Intact. Psych: Normal mood.  Normal behavior. Musc: Left hip with edema and tenderness, improving Neuro: Alert Motor: B/l UE: 4+/5 proximal distal Right lower extremity: 4-4+/5 proximal to distal, some pain inhibition, unchanged Left extremity: Hip flexion 3+/5, knee extension 4--4/5, ankle dorsiflexion 4-/5 with pain inhibition HOH Right facial weakness, stable  Assessment/Plan: 1. Functional deficits which require 3+ hours per day of interdisciplinary therapy in a comprehensive inpatient rehab setting.  Physiatrist is providing close team supervision and 24 hour management of active medical problems listed below.  Physiatrist and rehab team continue to assess barriers to discharge/monitor patient progress toward functional and medical  goals   Care Tool:  Bathing    Body parts bathed by patient: Right arm,Left arm,Chest,Abdomen,Right upper leg,Left upper leg,Face,Front perineal area,Left lower leg,Right lower leg,Buttocks   Body parts bathed by helper: Buttocks,Right lower leg,Left lower leg     Bathing assist Assist Level: Contact Guard/Touching assist     Upper Body Dressing/Undressing Upper body dressing   What is the patient wearing?: Pull over shirt,Bra    Upper body assist Assist Level: Minimal Assistance - Patient > 75%    Lower Body Dressing/Undressing Lower body dressing      What is the patient wearing?: Pants,Incontinence brief     Lower body assist Assist for lower body dressing: Minimal Assistance - Patient > 75%     Toileting Toileting    Toileting assist Assist for toileting: Contact Guard/Touching assist     Transfers Chair/bed transfer  Transfers assist     Chair/bed transfer assist level: Supervision/Verbal cueing     Locomotion Ambulation   Ambulation assist   Ambulation activity did not occur: Safety/medical concerns  Assist level: Supervision/Verbal cueing Assistive device: Walker-rolling Max distance: 35'   Walk 10 feet activity   Assist  Walk 10 feet activity did not occur: Safety/medical concerns  Assist level: Supervision/Verbal cueing Assistive device: Walker-rolling   Walk 50 feet activity   Assist Walk 50 feet with 2 turns activity did not occur: Safety/medical concerns         Walk 150 feet activity   Assist  Walk 150 feet activity did not occur: Safety/medical concerns         Walk 10 feet on uneven surface  activity   Assist Walk 10 feet on uneven surfaces activity did not occur: Safety/medical concerns   Assist level: Supervision/Verbal cueing Assistive device: Aeronautical engineer Will patient use wheelchair at discharge?: Yes Type of Wheelchair: Manual    Wheelchair assist level:  Supervision/Verbal cueing Max wheelchair distance: 154ft    Wheelchair 50 feet with 2 turns activity    Assist        Assist Level: Supervision/Verbal cueing   Wheelchair 150 feet activity     Assist      Assist Level: Total Assistance - Patient < 25%   Medical Problem List and Plan: 1.  Decreased functional ability secondary to left intertrochanteric femur fracture as well as history of left femur fracture May 2019 status post IM nailing as well as removal of hardware left femur from previous fracture 04/23/2020.  Weightbearing as tolerated.  Hospital course complicated by postoperative small acute infarct right cerebellum, bilateral occipital parietal lobes in the high left precentral gyrus.  DC today  Will see patient for transitional care management in 1 month post- discharge 2.  Antithrombotics: -DVT/anticoagulation: Chronic Coumadin INR therapeutic on 2/24             -antiplatelet therapy: n/a 3. Pain Management: Continue Oxycodone as needed (using), Robaxin as needed  See #17  Relatively controlled with meds on 2/24  Scheduled AM oxycodone at 0730 per pt/husband request so that she receives it as she begins to move with therapy 4. Mood: Provide emotional support             -antipsychotic agents: N/A 5. Neuropsych: This patient is ?fully capable of making decisions on her own behalf. 6.  Acute blood loss anemia.    Hemoglobin 9.0 on 2/21 7.  Atrial fibrillation.  Toprol-XL 25 mg daily.  Continue Coumadin.  Cardiac rate controlled  HR controlled on 2/17  Monitor with increased activity 8. Hypertension: Chlorthalidone on hold.    Losartan 25, increased to 50 on 2/9, increased on 2/15  Increased toprol to 37.5mg  daily  Hydralazine 10 mg nightly started on 2/22, increased on 2/23  Remains elevated overnight on 2/24, cont to monitor in ambulatory setting with potential further adjustments as necessary   Vitals:   05/21/20 1906 05/22/20 0559  BP: (!) 140/56 (!)  180/88  Pulse: 68 74  Resp: 16 19  Temp: 97.8 F (36.6 C) 98 F (36.7 C)  SpO2: 100% 93%   9.  CKD stage III.  Baseline creatinine ?1.46.    Creatinine 1.17 on 2/23  Encourage fluids  Continue to monitor 10.  Hyperlipidemia: Lipitor 11. Recurrent UTIs.    Macrobid changed to Levaquin, changed to cipro on 2/22 12. Skin/Wound Care: Routine skin checks 13. Fluids/Electrolytes/Nutrition: Routine in and outs 14. Very hard of hearing- talk to her LEFT ear.  15.  Chronic diarrhea: used Imodium 2 mg BID daily at home  16. Leg length discrepancy- L shorter than Right- husband to bring in appropriate lift shoes.  17. L shoulder bicipital tendinitis based on clinical exam  Continue Voltaren gel 2G QID 18.  Congestion  Flonase ordered  Resolved 19. GERD  Timing adjusted per patient/husband-husband would like medication at 715-discussed limitations in giving medication at an exact time on a consistent basis  Improved overall 20.  Sleep disturbance  Medications increased on  2/18  Trazodone 25 nightly as needed started on 2/18  Improved  > 30 minutes spent in total in discharge planning between myself and PA regarding aforementioned, as well discussion regarding DME equipment, follow-up appointments, follow-up therapies, discharge medications, discharge recommendations, answering questions.  Please see discharge summary as well.  LOS: 23 days A FACE TO FACE EVALUATION WAS PERFORMED  Drequan Ironside Lorie Phenix 05/22/2020, 4:06 PM

## 2020-05-22 NOTE — Plan of Care (Signed)
  Problem: RH SAFETY Goal: RH STG ADHERE TO SAFETY PRECAUTIONS W/ASSISTANCE/DEVICE Description STG Adhere to Safety Precautions With min Assistance/Device.  Outcome: Progressing   

## 2020-05-23 DIAGNOSIS — G4730 Sleep apnea, unspecified: Secondary | ICD-10-CM | POA: Diagnosis not present

## 2020-05-23 DIAGNOSIS — K219 Gastro-esophageal reflux disease without esophagitis: Secondary | ICD-10-CM | POA: Diagnosis not present

## 2020-05-23 DIAGNOSIS — S72142D Displaced intertrochanteric fracture of left femur, subsequent encounter for closed fracture with routine healing: Secondary | ICD-10-CM | POA: Diagnosis not present

## 2020-05-23 DIAGNOSIS — I890 Lymphedema, not elsewhere classified: Secondary | ICD-10-CM | POA: Diagnosis not present

## 2020-05-23 DIAGNOSIS — F32A Depression, unspecified: Secondary | ICD-10-CM | POA: Diagnosis not present

## 2020-05-23 DIAGNOSIS — E785 Hyperlipidemia, unspecified: Secondary | ICD-10-CM | POA: Diagnosis not present

## 2020-05-23 DIAGNOSIS — Z9013 Acquired absence of bilateral breasts and nipples: Secondary | ICD-10-CM | POA: Diagnosis not present

## 2020-05-23 DIAGNOSIS — J45909 Unspecified asthma, uncomplicated: Secondary | ICD-10-CM | POA: Diagnosis not present

## 2020-05-23 DIAGNOSIS — I129 Hypertensive chronic kidney disease with stage 1 through stage 4 chronic kidney disease, or unspecified chronic kidney disease: Secondary | ICD-10-CM | POA: Diagnosis not present

## 2020-05-23 DIAGNOSIS — N1831 Chronic kidney disease, stage 3a: Secondary | ICD-10-CM | POA: Diagnosis not present

## 2020-05-23 DIAGNOSIS — Z853 Personal history of malignant neoplasm of breast: Secondary | ICD-10-CM | POA: Diagnosis not present

## 2020-05-23 DIAGNOSIS — K588 Other irritable bowel syndrome: Secondary | ICD-10-CM | POA: Diagnosis not present

## 2020-05-23 DIAGNOSIS — I480 Paroxysmal atrial fibrillation: Secondary | ICD-10-CM | POA: Diagnosis not present

## 2020-05-23 DIAGNOSIS — Z8673 Personal history of transient ischemic attack (TIA), and cerebral infarction without residual deficits: Secondary | ICD-10-CM | POA: Diagnosis not present

## 2020-05-23 DIAGNOSIS — D62 Acute posthemorrhagic anemia: Secondary | ICD-10-CM | POA: Diagnosis not present

## 2020-05-23 DIAGNOSIS — J410 Simple chronic bronchitis: Secondary | ICD-10-CM | POA: Diagnosis not present

## 2020-05-23 DIAGNOSIS — Z87440 Personal history of urinary (tract) infections: Secondary | ICD-10-CM | POA: Diagnosis not present

## 2020-05-26 DIAGNOSIS — I480 Paroxysmal atrial fibrillation: Secondary | ICD-10-CM | POA: Diagnosis not present

## 2020-05-26 DIAGNOSIS — S72145A Nondisplaced intertrochanteric fracture of left femur, initial encounter for closed fracture: Secondary | ICD-10-CM | POA: Diagnosis not present

## 2020-05-26 DIAGNOSIS — K589 Irritable bowel syndrome without diarrhea: Secondary | ICD-10-CM | POA: Diagnosis not present

## 2020-05-26 DIAGNOSIS — E785 Hyperlipidemia, unspecified: Secondary | ICD-10-CM | POA: Diagnosis not present

## 2020-05-26 DIAGNOSIS — D62 Acute posthemorrhagic anemia: Secondary | ICD-10-CM | POA: Diagnosis not present

## 2020-05-26 DIAGNOSIS — K5903 Drug induced constipation: Secondary | ICD-10-CM | POA: Diagnosis not present

## 2020-05-26 DIAGNOSIS — I63541 Cerebral infarction due to unspecified occlusion or stenosis of right cerebellar artery: Secondary | ICD-10-CM | POA: Diagnosis not present

## 2020-05-26 DIAGNOSIS — R2689 Other abnormalities of gait and mobility: Secondary | ICD-10-CM | POA: Diagnosis not present

## 2020-05-26 DIAGNOSIS — N1832 Chronic kidney disease, stage 3b: Secondary | ICD-10-CM | POA: Diagnosis not present

## 2020-05-26 DIAGNOSIS — I129 Hypertensive chronic kidney disease with stage 1 through stage 4 chronic kidney disease, or unspecified chronic kidney disease: Secondary | ICD-10-CM | POA: Diagnosis not present

## 2020-05-26 DIAGNOSIS — N390 Urinary tract infection, site not specified: Secondary | ICD-10-CM | POA: Diagnosis not present

## 2020-05-26 DIAGNOSIS — Z7901 Long term (current) use of anticoagulants: Secondary | ICD-10-CM | POA: Diagnosis not present

## 2020-05-27 DIAGNOSIS — N1831 Chronic kidney disease, stage 3a: Secondary | ICD-10-CM | POA: Diagnosis not present

## 2020-05-27 DIAGNOSIS — S72142D Displaced intertrochanteric fracture of left femur, subsequent encounter for closed fracture with routine healing: Secondary | ICD-10-CM | POA: Diagnosis not present

## 2020-05-27 DIAGNOSIS — I480 Paroxysmal atrial fibrillation: Secondary | ICD-10-CM | POA: Diagnosis not present

## 2020-05-27 DIAGNOSIS — D62 Acute posthemorrhagic anemia: Secondary | ICD-10-CM | POA: Diagnosis not present

## 2020-05-27 DIAGNOSIS — J410 Simple chronic bronchitis: Secondary | ICD-10-CM | POA: Diagnosis not present

## 2020-05-27 DIAGNOSIS — I129 Hypertensive chronic kidney disease with stage 1 through stage 4 chronic kidney disease, or unspecified chronic kidney disease: Secondary | ICD-10-CM | POA: Diagnosis not present

## 2020-05-28 DIAGNOSIS — I480 Paroxysmal atrial fibrillation: Secondary | ICD-10-CM | POA: Diagnosis not present

## 2020-05-28 DIAGNOSIS — N1831 Chronic kidney disease, stage 3a: Secondary | ICD-10-CM | POA: Diagnosis not present

## 2020-05-28 DIAGNOSIS — D62 Acute posthemorrhagic anemia: Secondary | ICD-10-CM | POA: Diagnosis not present

## 2020-05-28 DIAGNOSIS — J410 Simple chronic bronchitis: Secondary | ICD-10-CM | POA: Diagnosis not present

## 2020-05-28 DIAGNOSIS — S72142D Displaced intertrochanteric fracture of left femur, subsequent encounter for closed fracture with routine healing: Secondary | ICD-10-CM | POA: Diagnosis not present

## 2020-05-28 DIAGNOSIS — I129 Hypertensive chronic kidney disease with stage 1 through stage 4 chronic kidney disease, or unspecified chronic kidney disease: Secondary | ICD-10-CM | POA: Diagnosis not present

## 2020-05-30 DIAGNOSIS — N1831 Chronic kidney disease, stage 3a: Secondary | ICD-10-CM | POA: Diagnosis not present

## 2020-05-30 DIAGNOSIS — D62 Acute posthemorrhagic anemia: Secondary | ICD-10-CM | POA: Diagnosis not present

## 2020-05-30 DIAGNOSIS — S72142D Displaced intertrochanteric fracture of left femur, subsequent encounter for closed fracture with routine healing: Secondary | ICD-10-CM | POA: Diagnosis not present

## 2020-05-30 DIAGNOSIS — J410 Simple chronic bronchitis: Secondary | ICD-10-CM | POA: Diagnosis not present

## 2020-05-30 DIAGNOSIS — I480 Paroxysmal atrial fibrillation: Secondary | ICD-10-CM | POA: Diagnosis not present

## 2020-05-30 DIAGNOSIS — I129 Hypertensive chronic kidney disease with stage 1 through stage 4 chronic kidney disease, or unspecified chronic kidney disease: Secondary | ICD-10-CM | POA: Diagnosis not present

## 2020-05-31 DIAGNOSIS — N1831 Chronic kidney disease, stage 3a: Secondary | ICD-10-CM | POA: Diagnosis not present

## 2020-05-31 DIAGNOSIS — I129 Hypertensive chronic kidney disease with stage 1 through stage 4 chronic kidney disease, or unspecified chronic kidney disease: Secondary | ICD-10-CM | POA: Diagnosis not present

## 2020-05-31 DIAGNOSIS — I480 Paroxysmal atrial fibrillation: Secondary | ICD-10-CM | POA: Diagnosis not present

## 2020-05-31 DIAGNOSIS — J410 Simple chronic bronchitis: Secondary | ICD-10-CM | POA: Diagnosis not present

## 2020-05-31 DIAGNOSIS — S72142D Displaced intertrochanteric fracture of left femur, subsequent encounter for closed fracture with routine healing: Secondary | ICD-10-CM | POA: Diagnosis not present

## 2020-05-31 DIAGNOSIS — D62 Acute posthemorrhagic anemia: Secondary | ICD-10-CM | POA: Diagnosis not present

## 2020-06-03 ENCOUNTER — Encounter: Payer: No Typology Code available for payment source | Admitting: Registered Nurse

## 2020-06-03 DIAGNOSIS — H90A31 Mixed conductive and sensorineural hearing loss, unilateral, right ear with restricted hearing on the contralateral side: Secondary | ICD-10-CM | POA: Diagnosis not present

## 2020-06-03 DIAGNOSIS — I480 Paroxysmal atrial fibrillation: Secondary | ICD-10-CM | POA: Diagnosis not present

## 2020-06-03 DIAGNOSIS — N1831 Chronic kidney disease, stage 3a: Secondary | ICD-10-CM | POA: Diagnosis not present

## 2020-06-03 DIAGNOSIS — S72142D Displaced intertrochanteric fracture of left femur, subsequent encounter for closed fracture with routine healing: Secondary | ICD-10-CM | POA: Diagnosis not present

## 2020-06-03 DIAGNOSIS — I129 Hypertensive chronic kidney disease with stage 1 through stage 4 chronic kidney disease, or unspecified chronic kidney disease: Secondary | ICD-10-CM | POA: Diagnosis not present

## 2020-06-03 DIAGNOSIS — D62 Acute posthemorrhagic anemia: Secondary | ICD-10-CM | POA: Diagnosis not present

## 2020-06-03 DIAGNOSIS — H90A22 Sensorineural hearing loss, unilateral, left ear, with restricted hearing on the contralateral side: Secondary | ICD-10-CM | POA: Diagnosis not present

## 2020-06-03 DIAGNOSIS — J410 Simple chronic bronchitis: Secondary | ICD-10-CM | POA: Diagnosis not present

## 2020-06-04 DIAGNOSIS — N1831 Chronic kidney disease, stage 3a: Secondary | ICD-10-CM | POA: Diagnosis not present

## 2020-06-04 DIAGNOSIS — I480 Paroxysmal atrial fibrillation: Secondary | ICD-10-CM | POA: Diagnosis not present

## 2020-06-04 DIAGNOSIS — D62 Acute posthemorrhagic anemia: Secondary | ICD-10-CM | POA: Diagnosis not present

## 2020-06-04 DIAGNOSIS — J410 Simple chronic bronchitis: Secondary | ICD-10-CM | POA: Diagnosis not present

## 2020-06-04 DIAGNOSIS — I129 Hypertensive chronic kidney disease with stage 1 through stage 4 chronic kidney disease, or unspecified chronic kidney disease: Secondary | ICD-10-CM | POA: Diagnosis not present

## 2020-06-04 DIAGNOSIS — S72142D Displaced intertrochanteric fracture of left femur, subsequent encounter for closed fracture with routine healing: Secondary | ICD-10-CM | POA: Diagnosis not present

## 2020-06-06 DIAGNOSIS — I129 Hypertensive chronic kidney disease with stage 1 through stage 4 chronic kidney disease, or unspecified chronic kidney disease: Secondary | ICD-10-CM | POA: Diagnosis not present

## 2020-06-06 DIAGNOSIS — I480 Paroxysmal atrial fibrillation: Secondary | ICD-10-CM | POA: Diagnosis not present

## 2020-06-06 DIAGNOSIS — S72142D Displaced intertrochanteric fracture of left femur, subsequent encounter for closed fracture with routine healing: Secondary | ICD-10-CM | POA: Diagnosis not present

## 2020-06-06 DIAGNOSIS — J410 Simple chronic bronchitis: Secondary | ICD-10-CM | POA: Diagnosis not present

## 2020-06-06 DIAGNOSIS — N1831 Chronic kidney disease, stage 3a: Secondary | ICD-10-CM | POA: Diagnosis not present

## 2020-06-06 DIAGNOSIS — D62 Acute posthemorrhagic anemia: Secondary | ICD-10-CM | POA: Diagnosis not present

## 2020-06-09 DIAGNOSIS — S72142D Displaced intertrochanteric fracture of left femur, subsequent encounter for closed fracture with routine healing: Secondary | ICD-10-CM | POA: Diagnosis not present

## 2020-06-09 DIAGNOSIS — I129 Hypertensive chronic kidney disease with stage 1 through stage 4 chronic kidney disease, or unspecified chronic kidney disease: Secondary | ICD-10-CM | POA: Diagnosis not present

## 2020-06-09 DIAGNOSIS — J410 Simple chronic bronchitis: Secondary | ICD-10-CM | POA: Diagnosis not present

## 2020-06-09 DIAGNOSIS — I480 Paroxysmal atrial fibrillation: Secondary | ICD-10-CM | POA: Diagnosis not present

## 2020-06-09 DIAGNOSIS — D62 Acute posthemorrhagic anemia: Secondary | ICD-10-CM | POA: Diagnosis not present

## 2020-06-09 DIAGNOSIS — N1831 Chronic kidney disease, stage 3a: Secondary | ICD-10-CM | POA: Diagnosis not present

## 2020-06-11 DIAGNOSIS — S72142D Displaced intertrochanteric fracture of left femur, subsequent encounter for closed fracture with routine healing: Secondary | ICD-10-CM | POA: Diagnosis not present

## 2020-06-11 DIAGNOSIS — D62 Acute posthemorrhagic anemia: Secondary | ICD-10-CM | POA: Diagnosis not present

## 2020-06-11 DIAGNOSIS — J410 Simple chronic bronchitis: Secondary | ICD-10-CM | POA: Diagnosis not present

## 2020-06-11 DIAGNOSIS — N1831 Chronic kidney disease, stage 3a: Secondary | ICD-10-CM | POA: Diagnosis not present

## 2020-06-11 DIAGNOSIS — I480 Paroxysmal atrial fibrillation: Secondary | ICD-10-CM | POA: Diagnosis not present

## 2020-06-11 DIAGNOSIS — I129 Hypertensive chronic kidney disease with stage 1 through stage 4 chronic kidney disease, or unspecified chronic kidney disease: Secondary | ICD-10-CM | POA: Diagnosis not present

## 2020-06-13 ENCOUNTER — Encounter (HOSPITAL_BASED_OUTPATIENT_CLINIC_OR_DEPARTMENT_OTHER): Payer: Self-pay

## 2020-06-13 ENCOUNTER — Telehealth (HOSPITAL_BASED_OUTPATIENT_CLINIC_OR_DEPARTMENT_OTHER): Payer: Self-pay

## 2020-06-13 ENCOUNTER — Other Ambulatory Visit (HOSPITAL_BASED_OUTPATIENT_CLINIC_OR_DEPARTMENT_OTHER): Payer: Self-pay

## 2020-06-13 DIAGNOSIS — D62 Acute posthemorrhagic anemia: Secondary | ICD-10-CM | POA: Diagnosis not present

## 2020-06-13 DIAGNOSIS — S72142D Displaced intertrochanteric fracture of left femur, subsequent encounter for closed fracture with routine healing: Secondary | ICD-10-CM | POA: Diagnosis not present

## 2020-06-13 DIAGNOSIS — N1831 Chronic kidney disease, stage 3a: Secondary | ICD-10-CM | POA: Diagnosis not present

## 2020-06-13 DIAGNOSIS — I480 Paroxysmal atrial fibrillation: Secondary | ICD-10-CM | POA: Diagnosis not present

## 2020-06-13 DIAGNOSIS — J410 Simple chronic bronchitis: Secondary | ICD-10-CM | POA: Diagnosis not present

## 2020-06-13 DIAGNOSIS — I129 Hypertensive chronic kidney disease with stage 1 through stage 4 chronic kidney disease, or unspecified chronic kidney disease: Secondary | ICD-10-CM | POA: Diagnosis not present

## 2020-06-17 DIAGNOSIS — D62 Acute posthemorrhagic anemia: Secondary | ICD-10-CM | POA: Diagnosis not present

## 2020-06-17 DIAGNOSIS — N1831 Chronic kidney disease, stage 3a: Secondary | ICD-10-CM | POA: Diagnosis not present

## 2020-06-17 DIAGNOSIS — J410 Simple chronic bronchitis: Secondary | ICD-10-CM | POA: Diagnosis not present

## 2020-06-17 DIAGNOSIS — S72142D Displaced intertrochanteric fracture of left femur, subsequent encounter for closed fracture with routine healing: Secondary | ICD-10-CM | POA: Diagnosis not present

## 2020-06-17 DIAGNOSIS — I129 Hypertensive chronic kidney disease with stage 1 through stage 4 chronic kidney disease, or unspecified chronic kidney disease: Secondary | ICD-10-CM | POA: Diagnosis not present

## 2020-06-17 DIAGNOSIS — I480 Paroxysmal atrial fibrillation: Secondary | ICD-10-CM | POA: Diagnosis not present

## 2020-06-18 DIAGNOSIS — D62 Acute posthemorrhagic anemia: Secondary | ICD-10-CM | POA: Diagnosis not present

## 2020-06-18 DIAGNOSIS — S72142D Displaced intertrochanteric fracture of left femur, subsequent encounter for closed fracture with routine healing: Secondary | ICD-10-CM | POA: Diagnosis not present

## 2020-06-18 DIAGNOSIS — N1831 Chronic kidney disease, stage 3a: Secondary | ICD-10-CM | POA: Diagnosis not present

## 2020-06-18 DIAGNOSIS — I480 Paroxysmal atrial fibrillation: Secondary | ICD-10-CM | POA: Diagnosis not present

## 2020-06-18 DIAGNOSIS — J410 Simple chronic bronchitis: Secondary | ICD-10-CM | POA: Diagnosis not present

## 2020-06-18 DIAGNOSIS — I129 Hypertensive chronic kidney disease with stage 1 through stage 4 chronic kidney disease, or unspecified chronic kidney disease: Secondary | ICD-10-CM | POA: Diagnosis not present

## 2020-06-20 DIAGNOSIS — I480 Paroxysmal atrial fibrillation: Secondary | ICD-10-CM | POA: Diagnosis not present

## 2020-06-20 DIAGNOSIS — I129 Hypertensive chronic kidney disease with stage 1 through stage 4 chronic kidney disease, or unspecified chronic kidney disease: Secondary | ICD-10-CM | POA: Diagnosis not present

## 2020-06-20 DIAGNOSIS — D62 Acute posthemorrhagic anemia: Secondary | ICD-10-CM | POA: Diagnosis not present

## 2020-06-20 DIAGNOSIS — S72142D Displaced intertrochanteric fracture of left femur, subsequent encounter for closed fracture with routine healing: Secondary | ICD-10-CM | POA: Diagnosis not present

## 2020-06-20 DIAGNOSIS — J410 Simple chronic bronchitis: Secondary | ICD-10-CM | POA: Diagnosis not present

## 2020-06-20 DIAGNOSIS — N1831 Chronic kidney disease, stage 3a: Secondary | ICD-10-CM | POA: Diagnosis not present

## 2020-06-22 DIAGNOSIS — E785 Hyperlipidemia, unspecified: Secondary | ICD-10-CM | POA: Diagnosis not present

## 2020-06-22 DIAGNOSIS — I480 Paroxysmal atrial fibrillation: Secondary | ICD-10-CM | POA: Diagnosis not present

## 2020-06-22 DIAGNOSIS — J410 Simple chronic bronchitis: Secondary | ICD-10-CM | POA: Diagnosis not present

## 2020-06-22 DIAGNOSIS — G4730 Sleep apnea, unspecified: Secondary | ICD-10-CM | POA: Diagnosis not present

## 2020-06-22 DIAGNOSIS — Z853 Personal history of malignant neoplasm of breast: Secondary | ICD-10-CM | POA: Diagnosis not present

## 2020-06-22 DIAGNOSIS — K588 Other irritable bowel syndrome: Secondary | ICD-10-CM | POA: Diagnosis not present

## 2020-06-22 DIAGNOSIS — J45909 Unspecified asthma, uncomplicated: Secondary | ICD-10-CM | POA: Diagnosis not present

## 2020-06-22 DIAGNOSIS — Z8673 Personal history of transient ischemic attack (TIA), and cerebral infarction without residual deficits: Secondary | ICD-10-CM | POA: Diagnosis not present

## 2020-06-22 DIAGNOSIS — F32A Depression, unspecified: Secondary | ICD-10-CM | POA: Diagnosis not present

## 2020-06-22 DIAGNOSIS — S72142D Displaced intertrochanteric fracture of left femur, subsequent encounter for closed fracture with routine healing: Secondary | ICD-10-CM | POA: Diagnosis not present

## 2020-06-22 DIAGNOSIS — Z9013 Acquired absence of bilateral breasts and nipples: Secondary | ICD-10-CM | POA: Diagnosis not present

## 2020-06-22 DIAGNOSIS — N1831 Chronic kidney disease, stage 3a: Secondary | ICD-10-CM | POA: Diagnosis not present

## 2020-06-22 DIAGNOSIS — D62 Acute posthemorrhagic anemia: Secondary | ICD-10-CM | POA: Diagnosis not present

## 2020-06-22 DIAGNOSIS — I890 Lymphedema, not elsewhere classified: Secondary | ICD-10-CM | POA: Diagnosis not present

## 2020-06-22 DIAGNOSIS — I129 Hypertensive chronic kidney disease with stage 1 through stage 4 chronic kidney disease, or unspecified chronic kidney disease: Secondary | ICD-10-CM | POA: Diagnosis not present

## 2020-06-22 DIAGNOSIS — K219 Gastro-esophageal reflux disease without esophagitis: Secondary | ICD-10-CM | POA: Diagnosis not present

## 2020-06-22 DIAGNOSIS — Z87440 Personal history of urinary (tract) infections: Secondary | ICD-10-CM | POA: Diagnosis not present

## 2020-06-23 DIAGNOSIS — D62 Acute posthemorrhagic anemia: Secondary | ICD-10-CM | POA: Diagnosis not present

## 2020-06-23 DIAGNOSIS — I480 Paroxysmal atrial fibrillation: Secondary | ICD-10-CM | POA: Diagnosis not present

## 2020-06-23 DIAGNOSIS — N1831 Chronic kidney disease, stage 3a: Secondary | ICD-10-CM | POA: Diagnosis not present

## 2020-06-23 DIAGNOSIS — J410 Simple chronic bronchitis: Secondary | ICD-10-CM | POA: Diagnosis not present

## 2020-06-23 DIAGNOSIS — S72142D Displaced intertrochanteric fracture of left femur, subsequent encounter for closed fracture with routine healing: Secondary | ICD-10-CM | POA: Diagnosis not present

## 2020-06-23 DIAGNOSIS — I129 Hypertensive chronic kidney disease with stage 1 through stage 4 chronic kidney disease, or unspecified chronic kidney disease: Secondary | ICD-10-CM | POA: Diagnosis not present

## 2020-06-24 DIAGNOSIS — Z7901 Long term (current) use of anticoagulants: Secondary | ICD-10-CM | POA: Diagnosis not present

## 2020-06-24 DIAGNOSIS — I63541 Cerebral infarction due to unspecified occlusion or stenosis of right cerebellar artery: Secondary | ICD-10-CM | POA: Diagnosis not present

## 2020-06-24 DIAGNOSIS — I480 Paroxysmal atrial fibrillation: Secondary | ICD-10-CM | POA: Diagnosis not present

## 2020-06-25 DIAGNOSIS — J410 Simple chronic bronchitis: Secondary | ICD-10-CM | POA: Diagnosis not present

## 2020-06-25 DIAGNOSIS — S72142D Displaced intertrochanteric fracture of left femur, subsequent encounter for closed fracture with routine healing: Secondary | ICD-10-CM | POA: Diagnosis not present

## 2020-06-25 DIAGNOSIS — I129 Hypertensive chronic kidney disease with stage 1 through stage 4 chronic kidney disease, or unspecified chronic kidney disease: Secondary | ICD-10-CM | POA: Diagnosis not present

## 2020-06-25 DIAGNOSIS — D62 Acute posthemorrhagic anemia: Secondary | ICD-10-CM | POA: Diagnosis not present

## 2020-06-25 DIAGNOSIS — N1831 Chronic kidney disease, stage 3a: Secondary | ICD-10-CM | POA: Diagnosis not present

## 2020-06-25 DIAGNOSIS — I480 Paroxysmal atrial fibrillation: Secondary | ICD-10-CM | POA: Diagnosis not present

## 2020-06-30 DIAGNOSIS — D62 Acute posthemorrhagic anemia: Secondary | ICD-10-CM | POA: Diagnosis not present

## 2020-06-30 DIAGNOSIS — S72142D Displaced intertrochanteric fracture of left femur, subsequent encounter for closed fracture with routine healing: Secondary | ICD-10-CM | POA: Diagnosis not present

## 2020-06-30 DIAGNOSIS — J410 Simple chronic bronchitis: Secondary | ICD-10-CM | POA: Diagnosis not present

## 2020-06-30 DIAGNOSIS — N1831 Chronic kidney disease, stage 3a: Secondary | ICD-10-CM | POA: Diagnosis not present

## 2020-06-30 DIAGNOSIS — I129 Hypertensive chronic kidney disease with stage 1 through stage 4 chronic kidney disease, or unspecified chronic kidney disease: Secondary | ICD-10-CM | POA: Diagnosis not present

## 2020-06-30 DIAGNOSIS — I480 Paroxysmal atrial fibrillation: Secondary | ICD-10-CM | POA: Diagnosis not present

## 2020-07-01 DIAGNOSIS — J410 Simple chronic bronchitis: Secondary | ICD-10-CM | POA: Diagnosis not present

## 2020-07-01 DIAGNOSIS — S72142D Displaced intertrochanteric fracture of left femur, subsequent encounter for closed fracture with routine healing: Secondary | ICD-10-CM | POA: Diagnosis not present

## 2020-07-01 DIAGNOSIS — D62 Acute posthemorrhagic anemia: Secondary | ICD-10-CM | POA: Diagnosis not present

## 2020-07-01 DIAGNOSIS — I129 Hypertensive chronic kidney disease with stage 1 through stage 4 chronic kidney disease, or unspecified chronic kidney disease: Secondary | ICD-10-CM | POA: Diagnosis not present

## 2020-07-01 DIAGNOSIS — I480 Paroxysmal atrial fibrillation: Secondary | ICD-10-CM | POA: Diagnosis not present

## 2020-07-01 DIAGNOSIS — N1831 Chronic kidney disease, stage 3a: Secondary | ICD-10-CM | POA: Diagnosis not present

## 2020-07-03 DIAGNOSIS — I63541 Cerebral infarction due to unspecified occlusion or stenosis of right cerebellar artery: Secondary | ICD-10-CM | POA: Diagnosis not present

## 2020-07-03 DIAGNOSIS — I480 Paroxysmal atrial fibrillation: Secondary | ICD-10-CM | POA: Diagnosis not present

## 2020-07-03 DIAGNOSIS — Z7901 Long term (current) use of anticoagulants: Secondary | ICD-10-CM | POA: Diagnosis not present

## 2020-07-04 DIAGNOSIS — I480 Paroxysmal atrial fibrillation: Secondary | ICD-10-CM | POA: Diagnosis not present

## 2020-07-04 DIAGNOSIS — I129 Hypertensive chronic kidney disease with stage 1 through stage 4 chronic kidney disease, or unspecified chronic kidney disease: Secondary | ICD-10-CM | POA: Diagnosis not present

## 2020-07-04 DIAGNOSIS — S72142D Displaced intertrochanteric fracture of left femur, subsequent encounter for closed fracture with routine healing: Secondary | ICD-10-CM | POA: Diagnosis not present

## 2020-07-04 DIAGNOSIS — N1831 Chronic kidney disease, stage 3a: Secondary | ICD-10-CM | POA: Diagnosis not present

## 2020-07-04 DIAGNOSIS — J410 Simple chronic bronchitis: Secondary | ICD-10-CM | POA: Diagnosis not present

## 2020-07-04 DIAGNOSIS — D62 Acute posthemorrhagic anemia: Secondary | ICD-10-CM | POA: Diagnosis not present

## 2020-07-07 DIAGNOSIS — D62 Acute posthemorrhagic anemia: Secondary | ICD-10-CM | POA: Diagnosis not present

## 2020-07-07 DIAGNOSIS — N1831 Chronic kidney disease, stage 3a: Secondary | ICD-10-CM | POA: Diagnosis not present

## 2020-07-07 DIAGNOSIS — J410 Simple chronic bronchitis: Secondary | ICD-10-CM | POA: Diagnosis not present

## 2020-07-07 DIAGNOSIS — S72142D Displaced intertrochanteric fracture of left femur, subsequent encounter for closed fracture with routine healing: Secondary | ICD-10-CM | POA: Diagnosis not present

## 2020-07-07 DIAGNOSIS — I129 Hypertensive chronic kidney disease with stage 1 through stage 4 chronic kidney disease, or unspecified chronic kidney disease: Secondary | ICD-10-CM | POA: Diagnosis not present

## 2020-07-07 DIAGNOSIS — I480 Paroxysmal atrial fibrillation: Secondary | ICD-10-CM | POA: Diagnosis not present

## 2020-07-11 DIAGNOSIS — N1831 Chronic kidney disease, stage 3a: Secondary | ICD-10-CM | POA: Diagnosis not present

## 2020-07-11 DIAGNOSIS — R399 Unspecified symptoms and signs involving the genitourinary system: Secondary | ICD-10-CM | POA: Diagnosis not present

## 2020-07-11 DIAGNOSIS — S72142D Displaced intertrochanteric fracture of left femur, subsequent encounter for closed fracture with routine healing: Secondary | ICD-10-CM | POA: Diagnosis not present

## 2020-07-11 DIAGNOSIS — J410 Simple chronic bronchitis: Secondary | ICD-10-CM | POA: Diagnosis not present

## 2020-07-11 DIAGNOSIS — I129 Hypertensive chronic kidney disease with stage 1 through stage 4 chronic kidney disease, or unspecified chronic kidney disease: Secondary | ICD-10-CM | POA: Diagnosis not present

## 2020-07-11 DIAGNOSIS — D62 Acute posthemorrhagic anemia: Secondary | ICD-10-CM | POA: Diagnosis not present

## 2020-07-11 DIAGNOSIS — I480 Paroxysmal atrial fibrillation: Secondary | ICD-10-CM | POA: Diagnosis not present

## 2020-07-15 DIAGNOSIS — I480 Paroxysmal atrial fibrillation: Secondary | ICD-10-CM | POA: Diagnosis not present

## 2020-07-15 DIAGNOSIS — Z7901 Long term (current) use of anticoagulants: Secondary | ICD-10-CM | POA: Diagnosis not present

## 2020-07-15 DIAGNOSIS — I63541 Cerebral infarction due to unspecified occlusion or stenosis of right cerebellar artery: Secondary | ICD-10-CM | POA: Diagnosis not present

## 2020-07-16 DIAGNOSIS — N1831 Chronic kidney disease, stage 3a: Secondary | ICD-10-CM | POA: Diagnosis not present

## 2020-07-16 DIAGNOSIS — I129 Hypertensive chronic kidney disease with stage 1 through stage 4 chronic kidney disease, or unspecified chronic kidney disease: Secondary | ICD-10-CM | POA: Diagnosis not present

## 2020-07-16 DIAGNOSIS — D62 Acute posthemorrhagic anemia: Secondary | ICD-10-CM | POA: Diagnosis not present

## 2020-07-16 DIAGNOSIS — J410 Simple chronic bronchitis: Secondary | ICD-10-CM | POA: Diagnosis not present

## 2020-07-16 DIAGNOSIS — I480 Paroxysmal atrial fibrillation: Secondary | ICD-10-CM | POA: Diagnosis not present

## 2020-07-16 DIAGNOSIS — S72142D Displaced intertrochanteric fracture of left femur, subsequent encounter for closed fracture with routine healing: Secondary | ICD-10-CM | POA: Diagnosis not present

## 2020-07-17 DIAGNOSIS — R399 Unspecified symptoms and signs involving the genitourinary system: Secondary | ICD-10-CM | POA: Diagnosis not present

## 2020-07-18 DIAGNOSIS — D62 Acute posthemorrhagic anemia: Secondary | ICD-10-CM | POA: Diagnosis not present

## 2020-07-18 DIAGNOSIS — I480 Paroxysmal atrial fibrillation: Secondary | ICD-10-CM | POA: Diagnosis not present

## 2020-07-18 DIAGNOSIS — J410 Simple chronic bronchitis: Secondary | ICD-10-CM | POA: Diagnosis not present

## 2020-07-18 DIAGNOSIS — S72142D Displaced intertrochanteric fracture of left femur, subsequent encounter for closed fracture with routine healing: Secondary | ICD-10-CM | POA: Diagnosis not present

## 2020-07-18 DIAGNOSIS — I129 Hypertensive chronic kidney disease with stage 1 through stage 4 chronic kidney disease, or unspecified chronic kidney disease: Secondary | ICD-10-CM | POA: Diagnosis not present

## 2020-07-18 DIAGNOSIS — N1831 Chronic kidney disease, stage 3a: Secondary | ICD-10-CM | POA: Diagnosis not present

## 2020-07-21 DIAGNOSIS — R399 Unspecified symptoms and signs involving the genitourinary system: Secondary | ICD-10-CM | POA: Diagnosis not present

## 2020-07-22 DIAGNOSIS — I480 Paroxysmal atrial fibrillation: Secondary | ICD-10-CM | POA: Diagnosis not present

## 2020-07-22 DIAGNOSIS — E785 Hyperlipidemia, unspecified: Secondary | ICD-10-CM | POA: Diagnosis not present

## 2020-07-22 DIAGNOSIS — G4730 Sleep apnea, unspecified: Secondary | ICD-10-CM | POA: Diagnosis not present

## 2020-07-22 DIAGNOSIS — F32A Depression, unspecified: Secondary | ICD-10-CM | POA: Diagnosis not present

## 2020-07-22 DIAGNOSIS — Z853 Personal history of malignant neoplasm of breast: Secondary | ICD-10-CM | POA: Diagnosis not present

## 2020-07-22 DIAGNOSIS — K588 Other irritable bowel syndrome: Secondary | ICD-10-CM | POA: Diagnosis not present

## 2020-07-22 DIAGNOSIS — Z9013 Acquired absence of bilateral breasts and nipples: Secondary | ICD-10-CM | POA: Diagnosis not present

## 2020-07-22 DIAGNOSIS — Z7901 Long term (current) use of anticoagulants: Secondary | ICD-10-CM | POA: Diagnosis not present

## 2020-07-22 DIAGNOSIS — I129 Hypertensive chronic kidney disease with stage 1 through stage 4 chronic kidney disease, or unspecified chronic kidney disease: Secondary | ICD-10-CM | POA: Diagnosis not present

## 2020-07-22 DIAGNOSIS — Z87440 Personal history of urinary (tract) infections: Secondary | ICD-10-CM | POA: Diagnosis not present

## 2020-07-22 DIAGNOSIS — I890 Lymphedema, not elsewhere classified: Secondary | ICD-10-CM | POA: Diagnosis not present

## 2020-07-22 DIAGNOSIS — S72142D Displaced intertrochanteric fracture of left femur, subsequent encounter for closed fracture with routine healing: Secondary | ICD-10-CM | POA: Diagnosis not present

## 2020-07-22 DIAGNOSIS — J45909 Unspecified asthma, uncomplicated: Secondary | ICD-10-CM | POA: Diagnosis not present

## 2020-07-22 DIAGNOSIS — D62 Acute posthemorrhagic anemia: Secondary | ICD-10-CM | POA: Diagnosis not present

## 2020-07-22 DIAGNOSIS — J410 Simple chronic bronchitis: Secondary | ICD-10-CM | POA: Diagnosis not present

## 2020-07-22 DIAGNOSIS — K219 Gastro-esophageal reflux disease without esophagitis: Secondary | ICD-10-CM | POA: Diagnosis not present

## 2020-07-22 DIAGNOSIS — Z8673 Personal history of transient ischemic attack (TIA), and cerebral infarction without residual deficits: Secondary | ICD-10-CM | POA: Diagnosis not present

## 2020-07-22 DIAGNOSIS — N1831 Chronic kidney disease, stage 3a: Secondary | ICD-10-CM | POA: Diagnosis not present

## 2020-07-23 DIAGNOSIS — J410 Simple chronic bronchitis: Secondary | ICD-10-CM | POA: Diagnosis not present

## 2020-07-23 DIAGNOSIS — S72142D Displaced intertrochanteric fracture of left femur, subsequent encounter for closed fracture with routine healing: Secondary | ICD-10-CM | POA: Diagnosis not present

## 2020-07-23 DIAGNOSIS — N1831 Chronic kidney disease, stage 3a: Secondary | ICD-10-CM | POA: Diagnosis not present

## 2020-07-23 DIAGNOSIS — I129 Hypertensive chronic kidney disease with stage 1 through stage 4 chronic kidney disease, or unspecified chronic kidney disease: Secondary | ICD-10-CM | POA: Diagnosis not present

## 2020-07-23 DIAGNOSIS — I480 Paroxysmal atrial fibrillation: Secondary | ICD-10-CM | POA: Diagnosis not present

## 2020-07-23 DIAGNOSIS — D62 Acute posthemorrhagic anemia: Secondary | ICD-10-CM | POA: Diagnosis not present

## 2020-07-24 DIAGNOSIS — I480 Paroxysmal atrial fibrillation: Secondary | ICD-10-CM | POA: Diagnosis not present

## 2020-07-24 DIAGNOSIS — D62 Acute posthemorrhagic anemia: Secondary | ICD-10-CM | POA: Diagnosis not present

## 2020-07-24 DIAGNOSIS — I129 Hypertensive chronic kidney disease with stage 1 through stage 4 chronic kidney disease, or unspecified chronic kidney disease: Secondary | ICD-10-CM | POA: Diagnosis not present

## 2020-07-24 DIAGNOSIS — S72142D Displaced intertrochanteric fracture of left femur, subsequent encounter for closed fracture with routine healing: Secondary | ICD-10-CM | POA: Diagnosis not present

## 2020-07-24 DIAGNOSIS — N1831 Chronic kidney disease, stage 3a: Secondary | ICD-10-CM | POA: Diagnosis not present

## 2020-07-24 DIAGNOSIS — N390 Urinary tract infection, site not specified: Secondary | ICD-10-CM | POA: Diagnosis not present

## 2020-07-24 DIAGNOSIS — J410 Simple chronic bronchitis: Secondary | ICD-10-CM | POA: Diagnosis not present

## 2020-07-25 DIAGNOSIS — R82998 Other abnormal findings in urine: Secondary | ICD-10-CM | POA: Diagnosis not present

## 2020-07-25 DIAGNOSIS — B965 Pseudomonas (aeruginosa) (mallei) (pseudomallei) as the cause of diseases classified elsewhere: Secondary | ICD-10-CM | POA: Diagnosis not present

## 2020-07-29 DIAGNOSIS — I480 Paroxysmal atrial fibrillation: Secondary | ICD-10-CM | POA: Diagnosis not present

## 2020-07-29 DIAGNOSIS — J410 Simple chronic bronchitis: Secondary | ICD-10-CM | POA: Diagnosis not present

## 2020-07-29 DIAGNOSIS — I129 Hypertensive chronic kidney disease with stage 1 through stage 4 chronic kidney disease, or unspecified chronic kidney disease: Secondary | ICD-10-CM | POA: Diagnosis not present

## 2020-07-29 DIAGNOSIS — Z7901 Long term (current) use of anticoagulants: Secondary | ICD-10-CM | POA: Diagnosis not present

## 2020-07-29 DIAGNOSIS — I63541 Cerebral infarction due to unspecified occlusion or stenosis of right cerebellar artery: Secondary | ICD-10-CM | POA: Diagnosis not present

## 2020-07-29 DIAGNOSIS — S72142D Displaced intertrochanteric fracture of left femur, subsequent encounter for closed fracture with routine healing: Secondary | ICD-10-CM | POA: Diagnosis not present

## 2020-07-29 DIAGNOSIS — N1831 Chronic kidney disease, stage 3a: Secondary | ICD-10-CM | POA: Diagnosis not present

## 2020-07-29 DIAGNOSIS — D62 Acute posthemorrhagic anemia: Secondary | ICD-10-CM | POA: Diagnosis not present

## 2020-07-30 DIAGNOSIS — N1831 Chronic kidney disease, stage 3a: Secondary | ICD-10-CM | POA: Diagnosis not present

## 2020-07-30 DIAGNOSIS — I480 Paroxysmal atrial fibrillation: Secondary | ICD-10-CM | POA: Diagnosis not present

## 2020-07-30 DIAGNOSIS — I129 Hypertensive chronic kidney disease with stage 1 through stage 4 chronic kidney disease, or unspecified chronic kidney disease: Secondary | ICD-10-CM | POA: Diagnosis not present

## 2020-07-30 DIAGNOSIS — D62 Acute posthemorrhagic anemia: Secondary | ICD-10-CM | POA: Diagnosis not present

## 2020-07-30 DIAGNOSIS — S72142D Displaced intertrochanteric fracture of left femur, subsequent encounter for closed fracture with routine healing: Secondary | ICD-10-CM | POA: Diagnosis not present

## 2020-07-30 DIAGNOSIS — J410 Simple chronic bronchitis: Secondary | ICD-10-CM | POA: Diagnosis not present

## 2020-08-01 DIAGNOSIS — I129 Hypertensive chronic kidney disease with stage 1 through stage 4 chronic kidney disease, or unspecified chronic kidney disease: Secondary | ICD-10-CM | POA: Diagnosis not present

## 2020-08-01 DIAGNOSIS — S72142D Displaced intertrochanteric fracture of left femur, subsequent encounter for closed fracture with routine healing: Secondary | ICD-10-CM | POA: Diagnosis not present

## 2020-08-01 DIAGNOSIS — D62 Acute posthemorrhagic anemia: Secondary | ICD-10-CM | POA: Diagnosis not present

## 2020-08-01 DIAGNOSIS — N1831 Chronic kidney disease, stage 3a: Secondary | ICD-10-CM | POA: Diagnosis not present

## 2020-08-01 DIAGNOSIS — J410 Simple chronic bronchitis: Secondary | ICD-10-CM | POA: Diagnosis not present

## 2020-08-01 DIAGNOSIS — I480 Paroxysmal atrial fibrillation: Secondary | ICD-10-CM | POA: Diagnosis not present

## 2020-08-04 DIAGNOSIS — J410 Simple chronic bronchitis: Secondary | ICD-10-CM | POA: Diagnosis not present

## 2020-08-04 DIAGNOSIS — N1831 Chronic kidney disease, stage 3a: Secondary | ICD-10-CM | POA: Diagnosis not present

## 2020-08-04 DIAGNOSIS — S72142D Displaced intertrochanteric fracture of left femur, subsequent encounter for closed fracture with routine healing: Secondary | ICD-10-CM | POA: Diagnosis not present

## 2020-08-04 DIAGNOSIS — D62 Acute posthemorrhagic anemia: Secondary | ICD-10-CM | POA: Diagnosis not present

## 2020-08-04 DIAGNOSIS — I129 Hypertensive chronic kidney disease with stage 1 through stage 4 chronic kidney disease, or unspecified chronic kidney disease: Secondary | ICD-10-CM | POA: Diagnosis not present

## 2020-08-04 DIAGNOSIS — I480 Paroxysmal atrial fibrillation: Secondary | ICD-10-CM | POA: Diagnosis not present

## 2020-08-06 DIAGNOSIS — J410 Simple chronic bronchitis: Secondary | ICD-10-CM | POA: Diagnosis not present

## 2020-08-06 DIAGNOSIS — I129 Hypertensive chronic kidney disease with stage 1 through stage 4 chronic kidney disease, or unspecified chronic kidney disease: Secondary | ICD-10-CM | POA: Diagnosis not present

## 2020-08-06 DIAGNOSIS — D62 Acute posthemorrhagic anemia: Secondary | ICD-10-CM | POA: Diagnosis not present

## 2020-08-06 DIAGNOSIS — I480 Paroxysmal atrial fibrillation: Secondary | ICD-10-CM | POA: Diagnosis not present

## 2020-08-06 DIAGNOSIS — S72142D Displaced intertrochanteric fracture of left femur, subsequent encounter for closed fracture with routine healing: Secondary | ICD-10-CM | POA: Diagnosis not present

## 2020-08-06 DIAGNOSIS — N1831 Chronic kidney disease, stage 3a: Secondary | ICD-10-CM | POA: Diagnosis not present

## 2020-08-07 DIAGNOSIS — R399 Unspecified symptoms and signs involving the genitourinary system: Secondary | ICD-10-CM | POA: Diagnosis not present

## 2020-08-08 DIAGNOSIS — I129 Hypertensive chronic kidney disease with stage 1 through stage 4 chronic kidney disease, or unspecified chronic kidney disease: Secondary | ICD-10-CM | POA: Diagnosis not present

## 2020-08-08 DIAGNOSIS — I480 Paroxysmal atrial fibrillation: Secondary | ICD-10-CM | POA: Diagnosis not present

## 2020-08-08 DIAGNOSIS — N1831 Chronic kidney disease, stage 3a: Secondary | ICD-10-CM | POA: Diagnosis not present

## 2020-08-08 DIAGNOSIS — D62 Acute posthemorrhagic anemia: Secondary | ICD-10-CM | POA: Diagnosis not present

## 2020-08-08 DIAGNOSIS — J410 Simple chronic bronchitis: Secondary | ICD-10-CM | POA: Diagnosis not present

## 2020-08-08 DIAGNOSIS — S72142D Displaced intertrochanteric fracture of left femur, subsequent encounter for closed fracture with routine healing: Secondary | ICD-10-CM | POA: Diagnosis not present

## 2020-08-11 DIAGNOSIS — I129 Hypertensive chronic kidney disease with stage 1 through stage 4 chronic kidney disease, or unspecified chronic kidney disease: Secondary | ICD-10-CM | POA: Diagnosis not present

## 2020-08-11 DIAGNOSIS — I480 Paroxysmal atrial fibrillation: Secondary | ICD-10-CM | POA: Diagnosis not present

## 2020-08-11 DIAGNOSIS — J410 Simple chronic bronchitis: Secondary | ICD-10-CM | POA: Diagnosis not present

## 2020-08-11 DIAGNOSIS — D62 Acute posthemorrhagic anemia: Secondary | ICD-10-CM | POA: Diagnosis not present

## 2020-08-11 DIAGNOSIS — S72142D Displaced intertrochanteric fracture of left femur, subsequent encounter for closed fracture with routine healing: Secondary | ICD-10-CM | POA: Diagnosis not present

## 2020-08-11 DIAGNOSIS — N1831 Chronic kidney disease, stage 3a: Secondary | ICD-10-CM | POA: Diagnosis not present

## 2020-08-12 DIAGNOSIS — I129 Hypertensive chronic kidney disease with stage 1 through stage 4 chronic kidney disease, or unspecified chronic kidney disease: Secondary | ICD-10-CM | POA: Diagnosis not present

## 2020-08-12 DIAGNOSIS — S72142D Displaced intertrochanteric fracture of left femur, subsequent encounter for closed fracture with routine healing: Secondary | ICD-10-CM | POA: Diagnosis not present

## 2020-08-12 DIAGNOSIS — D62 Acute posthemorrhagic anemia: Secondary | ICD-10-CM | POA: Diagnosis not present

## 2020-08-12 DIAGNOSIS — I480 Paroxysmal atrial fibrillation: Secondary | ICD-10-CM | POA: Diagnosis not present

## 2020-08-12 DIAGNOSIS — N1831 Chronic kidney disease, stage 3a: Secondary | ICD-10-CM | POA: Diagnosis not present

## 2020-08-12 DIAGNOSIS — J410 Simple chronic bronchitis: Secondary | ICD-10-CM | POA: Diagnosis not present

## 2020-08-13 DIAGNOSIS — Z7901 Long term (current) use of anticoagulants: Secondary | ICD-10-CM | POA: Diagnosis not present

## 2020-08-13 DIAGNOSIS — I480 Paroxysmal atrial fibrillation: Secondary | ICD-10-CM | POA: Diagnosis not present

## 2020-08-13 DIAGNOSIS — I63541 Cerebral infarction due to unspecified occlusion or stenosis of right cerebellar artery: Secondary | ICD-10-CM | POA: Diagnosis not present

## 2020-08-14 DIAGNOSIS — J410 Simple chronic bronchitis: Secondary | ICD-10-CM | POA: Diagnosis not present

## 2020-08-14 DIAGNOSIS — D62 Acute posthemorrhagic anemia: Secondary | ICD-10-CM | POA: Diagnosis not present

## 2020-08-14 DIAGNOSIS — N1831 Chronic kidney disease, stage 3a: Secondary | ICD-10-CM | POA: Diagnosis not present

## 2020-08-14 DIAGNOSIS — I480 Paroxysmal atrial fibrillation: Secondary | ICD-10-CM | POA: Diagnosis not present

## 2020-08-14 DIAGNOSIS — S72142D Displaced intertrochanteric fracture of left femur, subsequent encounter for closed fracture with routine healing: Secondary | ICD-10-CM | POA: Diagnosis not present

## 2020-08-14 DIAGNOSIS — I129 Hypertensive chronic kidney disease with stage 1 through stage 4 chronic kidney disease, or unspecified chronic kidney disease: Secondary | ICD-10-CM | POA: Diagnosis not present

## 2020-08-18 DIAGNOSIS — I129 Hypertensive chronic kidney disease with stage 1 through stage 4 chronic kidney disease, or unspecified chronic kidney disease: Secondary | ICD-10-CM | POA: Diagnosis not present

## 2020-08-18 DIAGNOSIS — I480 Paroxysmal atrial fibrillation: Secondary | ICD-10-CM | POA: Diagnosis not present

## 2020-08-18 DIAGNOSIS — N1831 Chronic kidney disease, stage 3a: Secondary | ICD-10-CM | POA: Diagnosis not present

## 2020-08-18 DIAGNOSIS — D62 Acute posthemorrhagic anemia: Secondary | ICD-10-CM | POA: Diagnosis not present

## 2020-08-18 DIAGNOSIS — S72142D Displaced intertrochanteric fracture of left femur, subsequent encounter for closed fracture with routine healing: Secondary | ICD-10-CM | POA: Diagnosis not present

## 2020-08-18 DIAGNOSIS — J410 Simple chronic bronchitis: Secondary | ICD-10-CM | POA: Diagnosis not present

## 2020-08-19 DIAGNOSIS — E785 Hyperlipidemia, unspecified: Secondary | ICD-10-CM | POA: Diagnosis not present

## 2020-08-19 DIAGNOSIS — M810 Age-related osteoporosis without current pathological fracture: Secondary | ICD-10-CM | POA: Diagnosis not present

## 2020-08-21 DIAGNOSIS — I129 Hypertensive chronic kidney disease with stage 1 through stage 4 chronic kidney disease, or unspecified chronic kidney disease: Secondary | ICD-10-CM | POA: Diagnosis not present

## 2020-08-21 DIAGNOSIS — I890 Lymphedema, not elsewhere classified: Secondary | ICD-10-CM | POA: Diagnosis not present

## 2020-08-21 DIAGNOSIS — D62 Acute posthemorrhagic anemia: Secondary | ICD-10-CM | POA: Diagnosis not present

## 2020-08-21 DIAGNOSIS — N1831 Chronic kidney disease, stage 3a: Secondary | ICD-10-CM | POA: Diagnosis not present

## 2020-08-21 DIAGNOSIS — Z7901 Long term (current) use of anticoagulants: Secondary | ICD-10-CM | POA: Diagnosis not present

## 2020-08-21 DIAGNOSIS — F32A Depression, unspecified: Secondary | ICD-10-CM | POA: Diagnosis not present

## 2020-08-21 DIAGNOSIS — K219 Gastro-esophageal reflux disease without esophagitis: Secondary | ICD-10-CM | POA: Diagnosis not present

## 2020-08-21 DIAGNOSIS — I480 Paroxysmal atrial fibrillation: Secondary | ICD-10-CM | POA: Diagnosis not present

## 2020-08-21 DIAGNOSIS — G4730 Sleep apnea, unspecified: Secondary | ICD-10-CM | POA: Diagnosis not present

## 2020-08-21 DIAGNOSIS — Z87440 Personal history of urinary (tract) infections: Secondary | ICD-10-CM | POA: Diagnosis not present

## 2020-08-21 DIAGNOSIS — Z853 Personal history of malignant neoplasm of breast: Secondary | ICD-10-CM | POA: Diagnosis not present

## 2020-08-21 DIAGNOSIS — J410 Simple chronic bronchitis: Secondary | ICD-10-CM | POA: Diagnosis not present

## 2020-08-21 DIAGNOSIS — E785 Hyperlipidemia, unspecified: Secondary | ICD-10-CM | POA: Diagnosis not present

## 2020-08-21 DIAGNOSIS — J45909 Unspecified asthma, uncomplicated: Secondary | ICD-10-CM | POA: Diagnosis not present

## 2020-08-21 DIAGNOSIS — Z8673 Personal history of transient ischemic attack (TIA), and cerebral infarction without residual deficits: Secondary | ICD-10-CM | POA: Diagnosis not present

## 2020-08-21 DIAGNOSIS — Z9013 Acquired absence of bilateral breasts and nipples: Secondary | ICD-10-CM | POA: Diagnosis not present

## 2020-08-21 DIAGNOSIS — S72142D Displaced intertrochanteric fracture of left femur, subsequent encounter for closed fracture with routine healing: Secondary | ICD-10-CM | POA: Diagnosis not present

## 2020-08-21 DIAGNOSIS — K588 Other irritable bowel syndrome: Secondary | ICD-10-CM | POA: Diagnosis not present

## 2020-08-22 DIAGNOSIS — S72142D Displaced intertrochanteric fracture of left femur, subsequent encounter for closed fracture with routine healing: Secondary | ICD-10-CM | POA: Diagnosis not present

## 2020-08-22 DIAGNOSIS — D62 Acute posthemorrhagic anemia: Secondary | ICD-10-CM | POA: Diagnosis not present

## 2020-08-22 DIAGNOSIS — N1831 Chronic kidney disease, stage 3a: Secondary | ICD-10-CM | POA: Diagnosis not present

## 2020-08-22 DIAGNOSIS — J410 Simple chronic bronchitis: Secondary | ICD-10-CM | POA: Diagnosis not present

## 2020-08-22 DIAGNOSIS — I129 Hypertensive chronic kidney disease with stage 1 through stage 4 chronic kidney disease, or unspecified chronic kidney disease: Secondary | ICD-10-CM | POA: Diagnosis not present

## 2020-08-22 DIAGNOSIS — I480 Paroxysmal atrial fibrillation: Secondary | ICD-10-CM | POA: Diagnosis not present

## 2020-08-26 DIAGNOSIS — Z7901 Long term (current) use of anticoagulants: Secondary | ICD-10-CM | POA: Diagnosis not present

## 2020-08-26 DIAGNOSIS — K219 Gastro-esophageal reflux disease without esophagitis: Secondary | ICD-10-CM | POA: Diagnosis not present

## 2020-08-26 DIAGNOSIS — I480 Paroxysmal atrial fibrillation: Secondary | ICD-10-CM | POA: Diagnosis not present

## 2020-08-26 DIAGNOSIS — I129 Hypertensive chronic kidney disease with stage 1 through stage 4 chronic kidney disease, or unspecified chronic kidney disease: Secondary | ICD-10-CM | POA: Diagnosis not present

## 2020-08-26 DIAGNOSIS — H9192 Unspecified hearing loss, left ear: Secondary | ICD-10-CM | POA: Diagnosis not present

## 2020-08-26 DIAGNOSIS — G4733 Obstructive sleep apnea (adult) (pediatric): Secondary | ICD-10-CM | POA: Diagnosis not present

## 2020-08-26 DIAGNOSIS — I6930 Unspecified sequelae of cerebral infarction: Secondary | ICD-10-CM | POA: Diagnosis not present

## 2020-08-26 DIAGNOSIS — R399 Unspecified symptoms and signs involving the genitourinary system: Secondary | ICD-10-CM | POA: Diagnosis not present

## 2020-08-26 DIAGNOSIS — R82998 Other abnormal findings in urine: Secondary | ICD-10-CM | POA: Diagnosis not present

## 2020-08-26 DIAGNOSIS — M25512 Pain in left shoulder: Secondary | ICD-10-CM | POA: Diagnosis not present

## 2020-08-26 DIAGNOSIS — N1832 Chronic kidney disease, stage 3b: Secondary | ICD-10-CM | POA: Diagnosis not present

## 2020-08-26 DIAGNOSIS — Z0000 Encounter for general adult medical examination without abnormal findings: Secondary | ICD-10-CM | POA: Diagnosis not present

## 2020-08-28 DIAGNOSIS — J410 Simple chronic bronchitis: Secondary | ICD-10-CM | POA: Diagnosis not present

## 2020-08-28 DIAGNOSIS — D62 Acute posthemorrhagic anemia: Secondary | ICD-10-CM | POA: Diagnosis not present

## 2020-08-28 DIAGNOSIS — I129 Hypertensive chronic kidney disease with stage 1 through stage 4 chronic kidney disease, or unspecified chronic kidney disease: Secondary | ICD-10-CM | POA: Diagnosis not present

## 2020-08-28 DIAGNOSIS — S72142D Displaced intertrochanteric fracture of left femur, subsequent encounter for closed fracture with routine healing: Secondary | ICD-10-CM | POA: Diagnosis not present

## 2020-08-28 DIAGNOSIS — I480 Paroxysmal atrial fibrillation: Secondary | ICD-10-CM | POA: Diagnosis not present

## 2020-08-28 DIAGNOSIS — N1831 Chronic kidney disease, stage 3a: Secondary | ICD-10-CM | POA: Diagnosis not present

## 2020-09-02 DIAGNOSIS — L570 Actinic keratosis: Secondary | ICD-10-CM | POA: Diagnosis not present

## 2020-09-02 DIAGNOSIS — Z7901 Long term (current) use of anticoagulants: Secondary | ICD-10-CM | POA: Diagnosis not present

## 2020-09-02 DIAGNOSIS — L820 Inflamed seborrheic keratosis: Secondary | ICD-10-CM | POA: Diagnosis not present

## 2020-09-02 DIAGNOSIS — D225 Melanocytic nevi of trunk: Secondary | ICD-10-CM | POA: Diagnosis not present

## 2020-09-02 DIAGNOSIS — L821 Other seborrheic keratosis: Secondary | ICD-10-CM | POA: Diagnosis not present

## 2020-09-02 DIAGNOSIS — L814 Other melanin hyperpigmentation: Secondary | ICD-10-CM | POA: Diagnosis not present

## 2020-09-02 DIAGNOSIS — I63541 Cerebral infarction due to unspecified occlusion or stenosis of right cerebellar artery: Secondary | ICD-10-CM | POA: Diagnosis not present

## 2020-09-02 DIAGNOSIS — Z85828 Personal history of other malignant neoplasm of skin: Secondary | ICD-10-CM | POA: Diagnosis not present

## 2020-09-02 DIAGNOSIS — L578 Other skin changes due to chronic exposure to nonionizing radiation: Secondary | ICD-10-CM | POA: Diagnosis not present

## 2020-09-02 DIAGNOSIS — I872 Venous insufficiency (chronic) (peripheral): Secondary | ICD-10-CM | POA: Diagnosis not present

## 2020-09-02 DIAGNOSIS — I480 Paroxysmal atrial fibrillation: Secondary | ICD-10-CM | POA: Diagnosis not present

## 2020-09-02 DIAGNOSIS — D2271 Melanocytic nevi of right lower limb, including hip: Secondary | ICD-10-CM | POA: Diagnosis not present

## 2020-09-03 DIAGNOSIS — S72142D Displaced intertrochanteric fracture of left femur, subsequent encounter for closed fracture with routine healing: Secondary | ICD-10-CM | POA: Diagnosis not present

## 2020-09-03 DIAGNOSIS — J410 Simple chronic bronchitis: Secondary | ICD-10-CM | POA: Diagnosis not present

## 2020-09-03 DIAGNOSIS — D62 Acute posthemorrhagic anemia: Secondary | ICD-10-CM | POA: Diagnosis not present

## 2020-09-03 DIAGNOSIS — I480 Paroxysmal atrial fibrillation: Secondary | ICD-10-CM | POA: Diagnosis not present

## 2020-09-03 DIAGNOSIS — N1831 Chronic kidney disease, stage 3a: Secondary | ICD-10-CM | POA: Diagnosis not present

## 2020-09-03 DIAGNOSIS — I129 Hypertensive chronic kidney disease with stage 1 through stage 4 chronic kidney disease, or unspecified chronic kidney disease: Secondary | ICD-10-CM | POA: Diagnosis not present

## 2020-09-03 DIAGNOSIS — R399 Unspecified symptoms and signs involving the genitourinary system: Secondary | ICD-10-CM | POA: Diagnosis not present

## 2020-09-10 DIAGNOSIS — I480 Paroxysmal atrial fibrillation: Secondary | ICD-10-CM | POA: Diagnosis not present

## 2020-09-10 DIAGNOSIS — S72142D Displaced intertrochanteric fracture of left femur, subsequent encounter for closed fracture with routine healing: Secondary | ICD-10-CM | POA: Diagnosis not present

## 2020-09-10 DIAGNOSIS — J410 Simple chronic bronchitis: Secondary | ICD-10-CM | POA: Diagnosis not present

## 2020-09-10 DIAGNOSIS — I129 Hypertensive chronic kidney disease with stage 1 through stage 4 chronic kidney disease, or unspecified chronic kidney disease: Secondary | ICD-10-CM | POA: Diagnosis not present

## 2020-09-10 DIAGNOSIS — D62 Acute posthemorrhagic anemia: Secondary | ICD-10-CM | POA: Diagnosis not present

## 2020-09-10 DIAGNOSIS — N1831 Chronic kidney disease, stage 3a: Secondary | ICD-10-CM | POA: Diagnosis not present

## 2020-09-15 DIAGNOSIS — R399 Unspecified symptoms and signs involving the genitourinary system: Secondary | ICD-10-CM | POA: Diagnosis not present

## 2020-09-17 DIAGNOSIS — D62 Acute posthemorrhagic anemia: Secondary | ICD-10-CM | POA: Diagnosis not present

## 2020-09-17 DIAGNOSIS — N1831 Chronic kidney disease, stage 3a: Secondary | ICD-10-CM | POA: Diagnosis not present

## 2020-09-17 DIAGNOSIS — I480 Paroxysmal atrial fibrillation: Secondary | ICD-10-CM | POA: Diagnosis not present

## 2020-09-17 DIAGNOSIS — I129 Hypertensive chronic kidney disease with stage 1 through stage 4 chronic kidney disease, or unspecified chronic kidney disease: Secondary | ICD-10-CM | POA: Diagnosis not present

## 2020-09-17 DIAGNOSIS — S72142D Displaced intertrochanteric fracture of left femur, subsequent encounter for closed fracture with routine healing: Secondary | ICD-10-CM | POA: Diagnosis not present

## 2020-09-17 DIAGNOSIS — J410 Simple chronic bronchitis: Secondary | ICD-10-CM | POA: Diagnosis not present

## 2020-09-20 DIAGNOSIS — I480 Paroxysmal atrial fibrillation: Secondary | ICD-10-CM | POA: Diagnosis not present

## 2020-09-20 DIAGNOSIS — D62 Acute posthemorrhagic anemia: Secondary | ICD-10-CM | POA: Diagnosis not present

## 2020-09-20 DIAGNOSIS — Z853 Personal history of malignant neoplasm of breast: Secondary | ICD-10-CM | POA: Diagnosis not present

## 2020-09-20 DIAGNOSIS — Z8673 Personal history of transient ischemic attack (TIA), and cerebral infarction without residual deficits: Secondary | ICD-10-CM | POA: Diagnosis not present

## 2020-09-20 DIAGNOSIS — Z87440 Personal history of urinary (tract) infections: Secondary | ICD-10-CM | POA: Diagnosis not present

## 2020-09-20 DIAGNOSIS — S72145D Nondisplaced intertrochanteric fracture of left femur, subsequent encounter for closed fracture with routine healing: Secondary | ICD-10-CM | POA: Diagnosis not present

## 2020-09-20 DIAGNOSIS — J45909 Unspecified asthma, uncomplicated: Secondary | ICD-10-CM | POA: Diagnosis not present

## 2020-09-20 DIAGNOSIS — K219 Gastro-esophageal reflux disease without esophagitis: Secondary | ICD-10-CM | POA: Diagnosis not present

## 2020-09-20 DIAGNOSIS — Z7901 Long term (current) use of anticoagulants: Secondary | ICD-10-CM | POA: Diagnosis not present

## 2020-09-20 DIAGNOSIS — J410 Simple chronic bronchitis: Secondary | ICD-10-CM | POA: Diagnosis not present

## 2020-09-20 DIAGNOSIS — E785 Hyperlipidemia, unspecified: Secondary | ICD-10-CM | POA: Diagnosis not present

## 2020-09-20 DIAGNOSIS — I129 Hypertensive chronic kidney disease with stage 1 through stage 4 chronic kidney disease, or unspecified chronic kidney disease: Secondary | ICD-10-CM | POA: Diagnosis not present

## 2020-09-20 DIAGNOSIS — G4730 Sleep apnea, unspecified: Secondary | ICD-10-CM | POA: Diagnosis not present

## 2020-09-20 DIAGNOSIS — I890 Lymphedema, not elsewhere classified: Secondary | ICD-10-CM | POA: Diagnosis not present

## 2020-09-20 DIAGNOSIS — N1831 Chronic kidney disease, stage 3a: Secondary | ICD-10-CM | POA: Diagnosis not present

## 2020-09-20 DIAGNOSIS — F32A Depression, unspecified: Secondary | ICD-10-CM | POA: Diagnosis not present

## 2020-09-20 DIAGNOSIS — K588 Other irritable bowel syndrome: Secondary | ICD-10-CM | POA: Diagnosis not present

## 2020-09-20 DIAGNOSIS — Z9013 Acquired absence of bilateral breasts and nipples: Secondary | ICD-10-CM | POA: Diagnosis not present

## 2020-09-23 DIAGNOSIS — I129 Hypertensive chronic kidney disease with stage 1 through stage 4 chronic kidney disease, or unspecified chronic kidney disease: Secondary | ICD-10-CM | POA: Diagnosis not present

## 2020-09-23 DIAGNOSIS — J410 Simple chronic bronchitis: Secondary | ICD-10-CM | POA: Diagnosis not present

## 2020-09-23 DIAGNOSIS — S72145D Nondisplaced intertrochanteric fracture of left femur, subsequent encounter for closed fracture with routine healing: Secondary | ICD-10-CM | POA: Diagnosis not present

## 2020-09-23 DIAGNOSIS — D62 Acute posthemorrhagic anemia: Secondary | ICD-10-CM | POA: Diagnosis not present

## 2020-09-23 DIAGNOSIS — N1831 Chronic kidney disease, stage 3a: Secondary | ICD-10-CM | POA: Diagnosis not present

## 2020-09-23 DIAGNOSIS — I480 Paroxysmal atrial fibrillation: Secondary | ICD-10-CM | POA: Diagnosis not present

## 2020-09-25 DIAGNOSIS — R399 Unspecified symptoms and signs involving the genitourinary system: Secondary | ICD-10-CM | POA: Diagnosis not present

## 2020-09-26 DIAGNOSIS — J410 Simple chronic bronchitis: Secondary | ICD-10-CM | POA: Diagnosis not present

## 2020-09-26 DIAGNOSIS — I129 Hypertensive chronic kidney disease with stage 1 through stage 4 chronic kidney disease, or unspecified chronic kidney disease: Secondary | ICD-10-CM | POA: Diagnosis not present

## 2020-09-26 DIAGNOSIS — I480 Paroxysmal atrial fibrillation: Secondary | ICD-10-CM | POA: Diagnosis not present

## 2020-09-26 DIAGNOSIS — S72145D Nondisplaced intertrochanteric fracture of left femur, subsequent encounter for closed fracture with routine healing: Secondary | ICD-10-CM | POA: Diagnosis not present

## 2020-09-26 DIAGNOSIS — N1831 Chronic kidney disease, stage 3a: Secondary | ICD-10-CM | POA: Diagnosis not present

## 2020-09-26 DIAGNOSIS — D62 Acute posthemorrhagic anemia: Secondary | ICD-10-CM | POA: Diagnosis not present

## 2020-09-30 DIAGNOSIS — N1831 Chronic kidney disease, stage 3a: Secondary | ICD-10-CM | POA: Diagnosis not present

## 2020-09-30 DIAGNOSIS — S72145D Nondisplaced intertrochanteric fracture of left femur, subsequent encounter for closed fracture with routine healing: Secondary | ICD-10-CM | POA: Diagnosis not present

## 2020-09-30 DIAGNOSIS — D62 Acute posthemorrhagic anemia: Secondary | ICD-10-CM | POA: Diagnosis not present

## 2020-09-30 DIAGNOSIS — J410 Simple chronic bronchitis: Secondary | ICD-10-CM | POA: Diagnosis not present

## 2020-09-30 DIAGNOSIS — I129 Hypertensive chronic kidney disease with stage 1 through stage 4 chronic kidney disease, or unspecified chronic kidney disease: Secondary | ICD-10-CM | POA: Diagnosis not present

## 2020-09-30 DIAGNOSIS — I480 Paroxysmal atrial fibrillation: Secondary | ICD-10-CM | POA: Diagnosis not present

## 2020-10-03 DIAGNOSIS — N1831 Chronic kidney disease, stage 3a: Secondary | ICD-10-CM | POA: Diagnosis not present

## 2020-10-03 DIAGNOSIS — S72145D Nondisplaced intertrochanteric fracture of left femur, subsequent encounter for closed fracture with routine healing: Secondary | ICD-10-CM | POA: Diagnosis not present

## 2020-10-03 DIAGNOSIS — J410 Simple chronic bronchitis: Secondary | ICD-10-CM | POA: Diagnosis not present

## 2020-10-03 DIAGNOSIS — I129 Hypertensive chronic kidney disease with stage 1 through stage 4 chronic kidney disease, or unspecified chronic kidney disease: Secondary | ICD-10-CM | POA: Diagnosis not present

## 2020-10-03 DIAGNOSIS — I480 Paroxysmal atrial fibrillation: Secondary | ICD-10-CM | POA: Diagnosis not present

## 2020-10-03 DIAGNOSIS — D62 Acute posthemorrhagic anemia: Secondary | ICD-10-CM | POA: Diagnosis not present

## 2020-10-06 DIAGNOSIS — N390 Urinary tract infection, site not specified: Secondary | ICD-10-CM | POA: Diagnosis not present

## 2020-10-06 DIAGNOSIS — N952 Postmenopausal atrophic vaginitis: Secondary | ICD-10-CM | POA: Diagnosis not present

## 2020-10-06 DIAGNOSIS — N281 Cyst of kidney, acquired: Secondary | ICD-10-CM | POA: Diagnosis not present

## 2020-10-07 DIAGNOSIS — I129 Hypertensive chronic kidney disease with stage 1 through stage 4 chronic kidney disease, or unspecified chronic kidney disease: Secondary | ICD-10-CM | POA: Diagnosis not present

## 2020-10-07 DIAGNOSIS — Z7901 Long term (current) use of anticoagulants: Secondary | ICD-10-CM | POA: Diagnosis not present

## 2020-10-07 DIAGNOSIS — I480 Paroxysmal atrial fibrillation: Secondary | ICD-10-CM | POA: Diagnosis not present

## 2020-10-07 DIAGNOSIS — I63541 Cerebral infarction due to unspecified occlusion or stenosis of right cerebellar artery: Secondary | ICD-10-CM | POA: Diagnosis not present

## 2020-10-07 DIAGNOSIS — J410 Simple chronic bronchitis: Secondary | ICD-10-CM | POA: Diagnosis not present

## 2020-10-07 DIAGNOSIS — S72145D Nondisplaced intertrochanteric fracture of left femur, subsequent encounter for closed fracture with routine healing: Secondary | ICD-10-CM | POA: Diagnosis not present

## 2020-10-07 DIAGNOSIS — D62 Acute posthemorrhagic anemia: Secondary | ICD-10-CM | POA: Diagnosis not present

## 2020-10-07 DIAGNOSIS — N1831 Chronic kidney disease, stage 3a: Secondary | ICD-10-CM | POA: Diagnosis not present

## 2020-10-10 DIAGNOSIS — I480 Paroxysmal atrial fibrillation: Secondary | ICD-10-CM | POA: Diagnosis not present

## 2020-10-10 DIAGNOSIS — N1831 Chronic kidney disease, stage 3a: Secondary | ICD-10-CM | POA: Diagnosis not present

## 2020-10-10 DIAGNOSIS — D62 Acute posthemorrhagic anemia: Secondary | ICD-10-CM | POA: Diagnosis not present

## 2020-10-10 DIAGNOSIS — I129 Hypertensive chronic kidney disease with stage 1 through stage 4 chronic kidney disease, or unspecified chronic kidney disease: Secondary | ICD-10-CM | POA: Diagnosis not present

## 2020-10-10 DIAGNOSIS — J410 Simple chronic bronchitis: Secondary | ICD-10-CM | POA: Diagnosis not present

## 2020-10-10 DIAGNOSIS — S72145D Nondisplaced intertrochanteric fracture of left femur, subsequent encounter for closed fracture with routine healing: Secondary | ICD-10-CM | POA: Diagnosis not present

## 2020-10-15 DIAGNOSIS — N1831 Chronic kidney disease, stage 3a: Secondary | ICD-10-CM | POA: Diagnosis not present

## 2020-10-15 DIAGNOSIS — I129 Hypertensive chronic kidney disease with stage 1 through stage 4 chronic kidney disease, or unspecified chronic kidney disease: Secondary | ICD-10-CM | POA: Diagnosis not present

## 2020-10-15 DIAGNOSIS — I480 Paroxysmal atrial fibrillation: Secondary | ICD-10-CM | POA: Diagnosis not present

## 2020-10-15 DIAGNOSIS — S72145D Nondisplaced intertrochanteric fracture of left femur, subsequent encounter for closed fracture with routine healing: Secondary | ICD-10-CM | POA: Diagnosis not present

## 2020-10-15 DIAGNOSIS — J410 Simple chronic bronchitis: Secondary | ICD-10-CM | POA: Diagnosis not present

## 2020-10-15 DIAGNOSIS — D62 Acute posthemorrhagic anemia: Secondary | ICD-10-CM | POA: Diagnosis not present

## 2020-10-17 DIAGNOSIS — R399 Unspecified symptoms and signs involving the genitourinary system: Secondary | ICD-10-CM | POA: Diagnosis not present

## 2020-10-20 DIAGNOSIS — E785 Hyperlipidemia, unspecified: Secondary | ICD-10-CM | POA: Diagnosis not present

## 2020-10-20 DIAGNOSIS — Z853 Personal history of malignant neoplasm of breast: Secondary | ICD-10-CM | POA: Diagnosis not present

## 2020-10-20 DIAGNOSIS — Z8673 Personal history of transient ischemic attack (TIA), and cerebral infarction without residual deficits: Secondary | ICD-10-CM | POA: Diagnosis not present

## 2020-10-20 DIAGNOSIS — I480 Paroxysmal atrial fibrillation: Secondary | ICD-10-CM | POA: Diagnosis not present

## 2020-10-20 DIAGNOSIS — K219 Gastro-esophageal reflux disease without esophagitis: Secondary | ICD-10-CM | POA: Diagnosis not present

## 2020-10-20 DIAGNOSIS — I890 Lymphedema, not elsewhere classified: Secondary | ICD-10-CM | POA: Diagnosis not present

## 2020-10-20 DIAGNOSIS — Z7901 Long term (current) use of anticoagulants: Secondary | ICD-10-CM | POA: Diagnosis not present

## 2020-10-20 DIAGNOSIS — J410 Simple chronic bronchitis: Secondary | ICD-10-CM | POA: Diagnosis not present

## 2020-10-20 DIAGNOSIS — S72145D Nondisplaced intertrochanteric fracture of left femur, subsequent encounter for closed fracture with routine healing: Secondary | ICD-10-CM | POA: Diagnosis not present

## 2020-10-20 DIAGNOSIS — Z87440 Personal history of urinary (tract) infections: Secondary | ICD-10-CM | POA: Diagnosis not present

## 2020-10-20 DIAGNOSIS — I129 Hypertensive chronic kidney disease with stage 1 through stage 4 chronic kidney disease, or unspecified chronic kidney disease: Secondary | ICD-10-CM | POA: Diagnosis not present

## 2020-10-20 DIAGNOSIS — Z9013 Acquired absence of bilateral breasts and nipples: Secondary | ICD-10-CM | POA: Diagnosis not present

## 2020-10-20 DIAGNOSIS — G4730 Sleep apnea, unspecified: Secondary | ICD-10-CM | POA: Diagnosis not present

## 2020-10-20 DIAGNOSIS — K588 Other irritable bowel syndrome: Secondary | ICD-10-CM | POA: Diagnosis not present

## 2020-10-20 DIAGNOSIS — J45909 Unspecified asthma, uncomplicated: Secondary | ICD-10-CM | POA: Diagnosis not present

## 2020-10-20 DIAGNOSIS — N1831 Chronic kidney disease, stage 3a: Secondary | ICD-10-CM | POA: Diagnosis not present

## 2020-10-20 DIAGNOSIS — D62 Acute posthemorrhagic anemia: Secondary | ICD-10-CM | POA: Diagnosis not present

## 2020-10-20 DIAGNOSIS — F32A Depression, unspecified: Secondary | ICD-10-CM | POA: Diagnosis not present

## 2020-10-21 DIAGNOSIS — N1831 Chronic kidney disease, stage 3a: Secondary | ICD-10-CM | POA: Diagnosis not present

## 2020-10-21 DIAGNOSIS — D62 Acute posthemorrhagic anemia: Secondary | ICD-10-CM | POA: Diagnosis not present

## 2020-10-21 DIAGNOSIS — S72145D Nondisplaced intertrochanteric fracture of left femur, subsequent encounter for closed fracture with routine healing: Secondary | ICD-10-CM | POA: Diagnosis not present

## 2020-10-21 DIAGNOSIS — I129 Hypertensive chronic kidney disease with stage 1 through stage 4 chronic kidney disease, or unspecified chronic kidney disease: Secondary | ICD-10-CM | POA: Diagnosis not present

## 2020-10-21 DIAGNOSIS — J410 Simple chronic bronchitis: Secondary | ICD-10-CM | POA: Diagnosis not present

## 2020-10-21 DIAGNOSIS — I480 Paroxysmal atrial fibrillation: Secondary | ICD-10-CM | POA: Diagnosis not present

## 2020-10-22 DIAGNOSIS — N3000 Acute cystitis without hematuria: Secondary | ICD-10-CM | POA: Diagnosis not present

## 2020-10-23 DIAGNOSIS — N3000 Acute cystitis without hematuria: Secondary | ICD-10-CM | POA: Diagnosis not present

## 2020-10-25 DIAGNOSIS — N390 Urinary tract infection, site not specified: Secondary | ICD-10-CM | POA: Diagnosis not present

## 2020-10-28 DIAGNOSIS — I129 Hypertensive chronic kidney disease with stage 1 through stage 4 chronic kidney disease, or unspecified chronic kidney disease: Secondary | ICD-10-CM | POA: Diagnosis not present

## 2020-10-28 DIAGNOSIS — S72145D Nondisplaced intertrochanteric fracture of left femur, subsequent encounter for closed fracture with routine healing: Secondary | ICD-10-CM | POA: Diagnosis not present

## 2020-10-28 DIAGNOSIS — D62 Acute posthemorrhagic anemia: Secondary | ICD-10-CM | POA: Diagnosis not present

## 2020-10-28 DIAGNOSIS — N1831 Chronic kidney disease, stage 3a: Secondary | ICD-10-CM | POA: Diagnosis not present

## 2020-10-28 DIAGNOSIS — J410 Simple chronic bronchitis: Secondary | ICD-10-CM | POA: Diagnosis not present

## 2020-10-28 DIAGNOSIS — I480 Paroxysmal atrial fibrillation: Secondary | ICD-10-CM | POA: Diagnosis not present

## 2020-10-29 DIAGNOSIS — R399 Unspecified symptoms and signs involving the genitourinary system: Secondary | ICD-10-CM | POA: Diagnosis not present

## 2020-11-05 DIAGNOSIS — N1831 Chronic kidney disease, stage 3a: Secondary | ICD-10-CM | POA: Diagnosis not present

## 2020-11-05 DIAGNOSIS — I129 Hypertensive chronic kidney disease with stage 1 through stage 4 chronic kidney disease, or unspecified chronic kidney disease: Secondary | ICD-10-CM | POA: Diagnosis not present

## 2020-11-05 DIAGNOSIS — J410 Simple chronic bronchitis: Secondary | ICD-10-CM | POA: Diagnosis not present

## 2020-11-05 DIAGNOSIS — I480 Paroxysmal atrial fibrillation: Secondary | ICD-10-CM | POA: Diagnosis not present

## 2020-11-05 DIAGNOSIS — D62 Acute posthemorrhagic anemia: Secondary | ICD-10-CM | POA: Diagnosis not present

## 2020-11-05 DIAGNOSIS — S72145D Nondisplaced intertrochanteric fracture of left femur, subsequent encounter for closed fracture with routine healing: Secondary | ICD-10-CM | POA: Diagnosis not present

## 2020-11-12 DIAGNOSIS — I480 Paroxysmal atrial fibrillation: Secondary | ICD-10-CM | POA: Diagnosis not present

## 2020-11-12 DIAGNOSIS — Z7901 Long term (current) use of anticoagulants: Secondary | ICD-10-CM | POA: Diagnosis not present

## 2020-11-12 DIAGNOSIS — I63541 Cerebral infarction due to unspecified occlusion or stenosis of right cerebellar artery: Secondary | ICD-10-CM | POA: Diagnosis not present

## 2020-11-24 DIAGNOSIS — N390 Urinary tract infection, site not specified: Secondary | ICD-10-CM | POA: Diagnosis not present

## 2020-12-07 DIAGNOSIS — Z23 Encounter for immunization: Secondary | ICD-10-CM | POA: Diagnosis not present

## 2020-12-09 DIAGNOSIS — N390 Urinary tract infection, site not specified: Secondary | ICD-10-CM | POA: Diagnosis not present

## 2020-12-16 DIAGNOSIS — I480 Paroxysmal atrial fibrillation: Secondary | ICD-10-CM | POA: Diagnosis not present

## 2020-12-16 DIAGNOSIS — Z23 Encounter for immunization: Secondary | ICD-10-CM | POA: Diagnosis not present

## 2020-12-16 DIAGNOSIS — Z7901 Long term (current) use of anticoagulants: Secondary | ICD-10-CM | POA: Diagnosis not present

## 2020-12-16 DIAGNOSIS — I63541 Cerebral infarction due to unspecified occlusion or stenosis of right cerebellar artery: Secondary | ICD-10-CM | POA: Diagnosis not present

## 2020-12-23 DIAGNOSIS — R399 Unspecified symptoms and signs involving the genitourinary system: Secondary | ICD-10-CM | POA: Diagnosis not present

## 2020-12-23 DIAGNOSIS — N390 Urinary tract infection, site not specified: Secondary | ICD-10-CM | POA: Diagnosis not present

## 2020-12-30 DIAGNOSIS — I63541 Cerebral infarction due to unspecified occlusion or stenosis of right cerebellar artery: Secondary | ICD-10-CM | POA: Diagnosis not present

## 2020-12-30 DIAGNOSIS — Z7901 Long term (current) use of anticoagulants: Secondary | ICD-10-CM | POA: Diagnosis not present

## 2020-12-30 DIAGNOSIS — I480 Paroxysmal atrial fibrillation: Secondary | ICD-10-CM | POA: Diagnosis not present

## 2021-01-05 DIAGNOSIS — R2689 Other abnormalities of gait and mobility: Secondary | ICD-10-CM | POA: Diagnosis not present

## 2021-01-05 DIAGNOSIS — R2681 Unsteadiness on feet: Secondary | ICD-10-CM | POA: Diagnosis not present

## 2021-01-05 DIAGNOSIS — I480 Paroxysmal atrial fibrillation: Secondary | ICD-10-CM | POA: Diagnosis not present

## 2021-01-05 DIAGNOSIS — M2170 Unequal limb length (acquired), unspecified site: Secondary | ICD-10-CM | POA: Diagnosis not present

## 2021-01-05 DIAGNOSIS — I63541 Cerebral infarction due to unspecified occlusion or stenosis of right cerebellar artery: Secondary | ICD-10-CM | POA: Diagnosis not present

## 2021-01-05 DIAGNOSIS — I6930 Unspecified sequelae of cerebral infarction: Secondary | ICD-10-CM | POA: Diagnosis not present

## 2021-01-05 DIAGNOSIS — S72145A Nondisplaced intertrochanteric fracture of left femur, initial encounter for closed fracture: Secondary | ICD-10-CM | POA: Diagnosis not present

## 2021-01-08 DIAGNOSIS — R399 Unspecified symptoms and signs involving the genitourinary system: Secondary | ICD-10-CM | POA: Diagnosis not present

## 2021-01-13 DIAGNOSIS — I480 Paroxysmal atrial fibrillation: Secondary | ICD-10-CM | POA: Diagnosis not present

## 2021-01-13 DIAGNOSIS — R399 Unspecified symptoms and signs involving the genitourinary system: Secondary | ICD-10-CM | POA: Diagnosis not present

## 2021-01-13 DIAGNOSIS — Z7901 Long term (current) use of anticoagulants: Secondary | ICD-10-CM | POA: Diagnosis not present

## 2021-01-13 DIAGNOSIS — I63541 Cerebral infarction due to unspecified occlusion or stenosis of right cerebellar artery: Secondary | ICD-10-CM | POA: Diagnosis not present

## 2021-01-15 DIAGNOSIS — Z79891 Long term (current) use of opiate analgesic: Secondary | ICD-10-CM | POA: Diagnosis not present

## 2021-01-15 DIAGNOSIS — F32A Depression, unspecified: Secondary | ICD-10-CM | POA: Diagnosis not present

## 2021-01-15 DIAGNOSIS — H8110 Benign paroxysmal vertigo, unspecified ear: Secondary | ICD-10-CM | POA: Diagnosis not present

## 2021-01-15 DIAGNOSIS — M8580 Other specified disorders of bone density and structure, unspecified site: Secondary | ICD-10-CM | POA: Diagnosis not present

## 2021-01-15 DIAGNOSIS — N1831 Chronic kidney disease, stage 3a: Secondary | ICD-10-CM | POA: Diagnosis not present

## 2021-01-15 DIAGNOSIS — Z8673 Personal history of transient ischemic attack (TIA), and cerebral infarction without residual deficits: Secondary | ICD-10-CM | POA: Diagnosis not present

## 2021-01-15 DIAGNOSIS — Z853 Personal history of malignant neoplasm of breast: Secondary | ICD-10-CM | POA: Diagnosis not present

## 2021-01-15 DIAGNOSIS — J410 Simple chronic bronchitis: Secondary | ICD-10-CM | POA: Diagnosis not present

## 2021-01-15 DIAGNOSIS — N3281 Overactive bladder: Secondary | ICD-10-CM | POA: Diagnosis not present

## 2021-01-15 DIAGNOSIS — I890 Lymphedema, not elsewhere classified: Secondary | ICD-10-CM | POA: Diagnosis not present

## 2021-01-15 DIAGNOSIS — D631 Anemia in chronic kidney disease: Secondary | ICD-10-CM | POA: Diagnosis not present

## 2021-01-15 DIAGNOSIS — R1312 Dysphagia, oropharyngeal phase: Secondary | ICD-10-CM | POA: Diagnosis not present

## 2021-01-15 DIAGNOSIS — I480 Paroxysmal atrial fibrillation: Secondary | ICD-10-CM | POA: Diagnosis not present

## 2021-01-15 DIAGNOSIS — J310 Chronic rhinitis: Secondary | ICD-10-CM | POA: Diagnosis not present

## 2021-01-15 DIAGNOSIS — N390 Urinary tract infection, site not specified: Secondary | ICD-10-CM | POA: Diagnosis not present

## 2021-01-15 DIAGNOSIS — G4733 Obstructive sleep apnea (adult) (pediatric): Secondary | ICD-10-CM | POA: Diagnosis not present

## 2021-01-15 DIAGNOSIS — K219 Gastro-esophageal reflux disease without esophagitis: Secondary | ICD-10-CM | POA: Diagnosis not present

## 2021-01-15 DIAGNOSIS — E785 Hyperlipidemia, unspecified: Secondary | ICD-10-CM | POA: Diagnosis not present

## 2021-01-15 DIAGNOSIS — J45909 Unspecified asthma, uncomplicated: Secondary | ICD-10-CM | POA: Diagnosis not present

## 2021-01-15 DIAGNOSIS — M2170 Unequal limb length (acquired), unspecified site: Secondary | ICD-10-CM | POA: Diagnosis not present

## 2021-01-15 DIAGNOSIS — Z7901 Long term (current) use of anticoagulants: Secondary | ICD-10-CM | POA: Diagnosis not present

## 2021-01-15 DIAGNOSIS — K588 Other irritable bowel syndrome: Secondary | ICD-10-CM | POA: Diagnosis not present

## 2021-01-15 DIAGNOSIS — I129 Hypertensive chronic kidney disease with stage 1 through stage 4 chronic kidney disease, or unspecified chronic kidney disease: Secondary | ICD-10-CM | POA: Diagnosis not present

## 2021-01-15 DIAGNOSIS — S72145D Nondisplaced intertrochanteric fracture of left femur, subsequent encounter for closed fracture with routine healing: Secondary | ICD-10-CM | POA: Diagnosis not present

## 2021-01-15 DIAGNOSIS — H9190 Unspecified hearing loss, unspecified ear: Secondary | ICD-10-CM | POA: Diagnosis not present

## 2021-01-21 DIAGNOSIS — N390 Urinary tract infection, site not specified: Secondary | ICD-10-CM | POA: Diagnosis not present

## 2021-01-21 DIAGNOSIS — D631 Anemia in chronic kidney disease: Secondary | ICD-10-CM | POA: Diagnosis not present

## 2021-01-21 DIAGNOSIS — S72145D Nondisplaced intertrochanteric fracture of left femur, subsequent encounter for closed fracture with routine healing: Secondary | ICD-10-CM | POA: Diagnosis not present

## 2021-01-21 DIAGNOSIS — N1831 Chronic kidney disease, stage 3a: Secondary | ICD-10-CM | POA: Diagnosis not present

## 2021-01-21 DIAGNOSIS — I480 Paroxysmal atrial fibrillation: Secondary | ICD-10-CM | POA: Diagnosis not present

## 2021-01-21 DIAGNOSIS — I129 Hypertensive chronic kidney disease with stage 1 through stage 4 chronic kidney disease, or unspecified chronic kidney disease: Secondary | ICD-10-CM | POA: Diagnosis not present

## 2021-01-23 DIAGNOSIS — S72145D Nondisplaced intertrochanteric fracture of left femur, subsequent encounter for closed fracture with routine healing: Secondary | ICD-10-CM | POA: Diagnosis not present

## 2021-01-23 DIAGNOSIS — N1831 Chronic kidney disease, stage 3a: Secondary | ICD-10-CM | POA: Diagnosis not present

## 2021-01-23 DIAGNOSIS — I480 Paroxysmal atrial fibrillation: Secondary | ICD-10-CM | POA: Diagnosis not present

## 2021-01-23 DIAGNOSIS — N390 Urinary tract infection, site not specified: Secondary | ICD-10-CM | POA: Diagnosis not present

## 2021-01-23 DIAGNOSIS — D631 Anemia in chronic kidney disease: Secondary | ICD-10-CM | POA: Diagnosis not present

## 2021-01-23 DIAGNOSIS — I129 Hypertensive chronic kidney disease with stage 1 through stage 4 chronic kidney disease, or unspecified chronic kidney disease: Secondary | ICD-10-CM | POA: Diagnosis not present

## 2021-01-27 DIAGNOSIS — S72145D Nondisplaced intertrochanteric fracture of left femur, subsequent encounter for closed fracture with routine healing: Secondary | ICD-10-CM | POA: Diagnosis not present

## 2021-01-27 DIAGNOSIS — D631 Anemia in chronic kidney disease: Secondary | ICD-10-CM | POA: Diagnosis not present

## 2021-01-27 DIAGNOSIS — I480 Paroxysmal atrial fibrillation: Secondary | ICD-10-CM | POA: Diagnosis not present

## 2021-01-27 DIAGNOSIS — N1831 Chronic kidney disease, stage 3a: Secondary | ICD-10-CM | POA: Diagnosis not present

## 2021-01-27 DIAGNOSIS — Z7901 Long term (current) use of anticoagulants: Secondary | ICD-10-CM | POA: Diagnosis not present

## 2021-01-27 DIAGNOSIS — N390 Urinary tract infection, site not specified: Secondary | ICD-10-CM | POA: Diagnosis not present

## 2021-01-27 DIAGNOSIS — I63541 Cerebral infarction due to unspecified occlusion or stenosis of right cerebellar artery: Secondary | ICD-10-CM | POA: Diagnosis not present

## 2021-01-27 DIAGNOSIS — I129 Hypertensive chronic kidney disease with stage 1 through stage 4 chronic kidney disease, or unspecified chronic kidney disease: Secondary | ICD-10-CM | POA: Diagnosis not present

## 2021-01-30 DIAGNOSIS — S72145D Nondisplaced intertrochanteric fracture of left femur, subsequent encounter for closed fracture with routine healing: Secondary | ICD-10-CM | POA: Diagnosis not present

## 2021-01-30 DIAGNOSIS — N1831 Chronic kidney disease, stage 3a: Secondary | ICD-10-CM | POA: Diagnosis not present

## 2021-01-30 DIAGNOSIS — I129 Hypertensive chronic kidney disease with stage 1 through stage 4 chronic kidney disease, or unspecified chronic kidney disease: Secondary | ICD-10-CM | POA: Diagnosis not present

## 2021-01-30 DIAGNOSIS — N390 Urinary tract infection, site not specified: Secondary | ICD-10-CM | POA: Diagnosis not present

## 2021-01-30 DIAGNOSIS — D631 Anemia in chronic kidney disease: Secondary | ICD-10-CM | POA: Diagnosis not present

## 2021-01-30 DIAGNOSIS — I480 Paroxysmal atrial fibrillation: Secondary | ICD-10-CM | POA: Diagnosis not present

## 2021-02-02 DIAGNOSIS — N390 Urinary tract infection, site not specified: Secondary | ICD-10-CM | POA: Diagnosis not present

## 2021-02-03 DIAGNOSIS — I129 Hypertensive chronic kidney disease with stage 1 through stage 4 chronic kidney disease, or unspecified chronic kidney disease: Secondary | ICD-10-CM | POA: Diagnosis not present

## 2021-02-03 DIAGNOSIS — N1831 Chronic kidney disease, stage 3a: Secondary | ICD-10-CM | POA: Diagnosis not present

## 2021-02-03 DIAGNOSIS — D631 Anemia in chronic kidney disease: Secondary | ICD-10-CM | POA: Diagnosis not present

## 2021-02-03 DIAGNOSIS — I480 Paroxysmal atrial fibrillation: Secondary | ICD-10-CM | POA: Diagnosis not present

## 2021-02-03 DIAGNOSIS — S72145D Nondisplaced intertrochanteric fracture of left femur, subsequent encounter for closed fracture with routine healing: Secondary | ICD-10-CM | POA: Diagnosis not present

## 2021-02-03 DIAGNOSIS — N390 Urinary tract infection, site not specified: Secondary | ICD-10-CM | POA: Diagnosis not present

## 2021-02-05 DIAGNOSIS — I129 Hypertensive chronic kidney disease with stage 1 through stage 4 chronic kidney disease, or unspecified chronic kidney disease: Secondary | ICD-10-CM | POA: Diagnosis not present

## 2021-02-05 DIAGNOSIS — N1831 Chronic kidney disease, stage 3a: Secondary | ICD-10-CM | POA: Diagnosis not present

## 2021-02-05 DIAGNOSIS — N390 Urinary tract infection, site not specified: Secondary | ICD-10-CM | POA: Diagnosis not present

## 2021-02-05 DIAGNOSIS — I480 Paroxysmal atrial fibrillation: Secondary | ICD-10-CM | POA: Diagnosis not present

## 2021-02-05 DIAGNOSIS — S72145D Nondisplaced intertrochanteric fracture of left femur, subsequent encounter for closed fracture with routine healing: Secondary | ICD-10-CM | POA: Diagnosis not present

## 2021-02-05 DIAGNOSIS — D631 Anemia in chronic kidney disease: Secondary | ICD-10-CM | POA: Diagnosis not present

## 2021-02-12 DIAGNOSIS — S72145D Nondisplaced intertrochanteric fracture of left femur, subsequent encounter for closed fracture with routine healing: Secondary | ICD-10-CM | POA: Diagnosis not present

## 2021-02-12 DIAGNOSIS — I129 Hypertensive chronic kidney disease with stage 1 through stage 4 chronic kidney disease, or unspecified chronic kidney disease: Secondary | ICD-10-CM | POA: Diagnosis not present

## 2021-02-12 DIAGNOSIS — I480 Paroxysmal atrial fibrillation: Secondary | ICD-10-CM | POA: Diagnosis not present

## 2021-02-12 DIAGNOSIS — D631 Anemia in chronic kidney disease: Secondary | ICD-10-CM | POA: Diagnosis not present

## 2021-02-12 DIAGNOSIS — N1831 Chronic kidney disease, stage 3a: Secondary | ICD-10-CM | POA: Diagnosis not present

## 2021-02-12 DIAGNOSIS — N390 Urinary tract infection, site not specified: Secondary | ICD-10-CM | POA: Diagnosis not present

## 2021-02-14 DIAGNOSIS — K588 Other irritable bowel syndrome: Secondary | ICD-10-CM | POA: Diagnosis not present

## 2021-02-14 DIAGNOSIS — M8580 Other specified disorders of bone density and structure, unspecified site: Secondary | ICD-10-CM | POA: Diagnosis not present

## 2021-02-14 DIAGNOSIS — G4733 Obstructive sleep apnea (adult) (pediatric): Secondary | ICD-10-CM | POA: Diagnosis not present

## 2021-02-14 DIAGNOSIS — J310 Chronic rhinitis: Secondary | ICD-10-CM | POA: Diagnosis not present

## 2021-02-14 DIAGNOSIS — M2170 Unequal limb length (acquired), unspecified site: Secondary | ICD-10-CM | POA: Diagnosis not present

## 2021-02-14 DIAGNOSIS — K219 Gastro-esophageal reflux disease without esophagitis: Secondary | ICD-10-CM | POA: Diagnosis not present

## 2021-02-14 DIAGNOSIS — N3281 Overactive bladder: Secondary | ICD-10-CM | POA: Diagnosis not present

## 2021-02-14 DIAGNOSIS — N390 Urinary tract infection, site not specified: Secondary | ICD-10-CM | POA: Diagnosis not present

## 2021-02-14 DIAGNOSIS — Z8673 Personal history of transient ischemic attack (TIA), and cerebral infarction without residual deficits: Secondary | ICD-10-CM | POA: Diagnosis not present

## 2021-02-14 DIAGNOSIS — H9190 Unspecified hearing loss, unspecified ear: Secondary | ICD-10-CM | POA: Diagnosis not present

## 2021-02-14 DIAGNOSIS — E785 Hyperlipidemia, unspecified: Secondary | ICD-10-CM | POA: Diagnosis not present

## 2021-02-14 DIAGNOSIS — R1312 Dysphagia, oropharyngeal phase: Secondary | ICD-10-CM | POA: Diagnosis not present

## 2021-02-14 DIAGNOSIS — I890 Lymphedema, not elsewhere classified: Secondary | ICD-10-CM | POA: Diagnosis not present

## 2021-02-14 DIAGNOSIS — J45909 Unspecified asthma, uncomplicated: Secondary | ICD-10-CM | POA: Diagnosis not present

## 2021-02-14 DIAGNOSIS — Z79891 Long term (current) use of opiate analgesic: Secondary | ICD-10-CM | POA: Diagnosis not present

## 2021-02-14 DIAGNOSIS — F32A Depression, unspecified: Secondary | ICD-10-CM | POA: Diagnosis not present

## 2021-02-14 DIAGNOSIS — D631 Anemia in chronic kidney disease: Secondary | ICD-10-CM | POA: Diagnosis not present

## 2021-02-14 DIAGNOSIS — H8110 Benign paroxysmal vertigo, unspecified ear: Secondary | ICD-10-CM | POA: Diagnosis not present

## 2021-02-14 DIAGNOSIS — Z853 Personal history of malignant neoplasm of breast: Secondary | ICD-10-CM | POA: Diagnosis not present

## 2021-02-14 DIAGNOSIS — I129 Hypertensive chronic kidney disease with stage 1 through stage 4 chronic kidney disease, or unspecified chronic kidney disease: Secondary | ICD-10-CM | POA: Diagnosis not present

## 2021-02-14 DIAGNOSIS — N1831 Chronic kidney disease, stage 3a: Secondary | ICD-10-CM | POA: Diagnosis not present

## 2021-02-14 DIAGNOSIS — S72145D Nondisplaced intertrochanteric fracture of left femur, subsequent encounter for closed fracture with routine healing: Secondary | ICD-10-CM | POA: Diagnosis not present

## 2021-02-14 DIAGNOSIS — J410 Simple chronic bronchitis: Secondary | ICD-10-CM | POA: Diagnosis not present

## 2021-02-14 DIAGNOSIS — Z7901 Long term (current) use of anticoagulants: Secondary | ICD-10-CM | POA: Diagnosis not present

## 2021-02-14 DIAGNOSIS — I480 Paroxysmal atrial fibrillation: Secondary | ICD-10-CM | POA: Diagnosis not present

## 2021-02-16 DIAGNOSIS — N3001 Acute cystitis with hematuria: Secondary | ICD-10-CM | POA: Diagnosis not present

## 2021-02-17 DIAGNOSIS — I129 Hypertensive chronic kidney disease with stage 1 through stage 4 chronic kidney disease, or unspecified chronic kidney disease: Secondary | ICD-10-CM | POA: Diagnosis not present

## 2021-02-17 DIAGNOSIS — D631 Anemia in chronic kidney disease: Secondary | ICD-10-CM | POA: Diagnosis not present

## 2021-02-17 DIAGNOSIS — N390 Urinary tract infection, site not specified: Secondary | ICD-10-CM | POA: Diagnosis not present

## 2021-02-17 DIAGNOSIS — N1831 Chronic kidney disease, stage 3a: Secondary | ICD-10-CM | POA: Diagnosis not present

## 2021-02-17 DIAGNOSIS — S72145D Nondisplaced intertrochanteric fracture of left femur, subsequent encounter for closed fracture with routine healing: Secondary | ICD-10-CM | POA: Diagnosis not present

## 2021-02-17 DIAGNOSIS — I480 Paroxysmal atrial fibrillation: Secondary | ICD-10-CM | POA: Diagnosis not present

## 2021-02-23 DIAGNOSIS — N390 Urinary tract infection, site not specified: Secondary | ICD-10-CM | POA: Diagnosis not present

## 2021-02-23 DIAGNOSIS — R399 Unspecified symptoms and signs involving the genitourinary system: Secondary | ICD-10-CM | POA: Diagnosis not present

## 2021-02-24 DIAGNOSIS — I480 Paroxysmal atrial fibrillation: Secondary | ICD-10-CM | POA: Diagnosis not present

## 2021-02-24 DIAGNOSIS — Z7901 Long term (current) use of anticoagulants: Secondary | ICD-10-CM | POA: Diagnosis not present

## 2021-02-24 DIAGNOSIS — I63541 Cerebral infarction due to unspecified occlusion or stenosis of right cerebellar artery: Secondary | ICD-10-CM | POA: Diagnosis not present

## 2021-02-25 DIAGNOSIS — N390 Urinary tract infection, site not specified: Secondary | ICD-10-CM | POA: Diagnosis not present

## 2021-02-26 DIAGNOSIS — N390 Urinary tract infection, site not specified: Secondary | ICD-10-CM | POA: Diagnosis not present

## 2021-02-26 DIAGNOSIS — I129 Hypertensive chronic kidney disease with stage 1 through stage 4 chronic kidney disease, or unspecified chronic kidney disease: Secondary | ICD-10-CM | POA: Diagnosis not present

## 2021-02-26 DIAGNOSIS — I480 Paroxysmal atrial fibrillation: Secondary | ICD-10-CM | POA: Diagnosis not present

## 2021-02-26 DIAGNOSIS — D631 Anemia in chronic kidney disease: Secondary | ICD-10-CM | POA: Diagnosis not present

## 2021-02-26 DIAGNOSIS — Z792 Long term (current) use of antibiotics: Secondary | ICD-10-CM | POA: Diagnosis not present

## 2021-02-26 DIAGNOSIS — N1831 Chronic kidney disease, stage 3a: Secondary | ICD-10-CM | POA: Diagnosis not present

## 2021-02-26 DIAGNOSIS — S72145D Nondisplaced intertrochanteric fracture of left femur, subsequent encounter for closed fracture with routine healing: Secondary | ICD-10-CM | POA: Diagnosis not present

## 2021-02-27 DIAGNOSIS — N390 Urinary tract infection, site not specified: Secondary | ICD-10-CM | POA: Diagnosis not present

## 2021-03-05 DIAGNOSIS — I129 Hypertensive chronic kidney disease with stage 1 through stage 4 chronic kidney disease, or unspecified chronic kidney disease: Secondary | ICD-10-CM | POA: Diagnosis not present

## 2021-03-05 DIAGNOSIS — S72145D Nondisplaced intertrochanteric fracture of left femur, subsequent encounter for closed fracture with routine healing: Secondary | ICD-10-CM | POA: Diagnosis not present

## 2021-03-05 DIAGNOSIS — N390 Urinary tract infection, site not specified: Secondary | ICD-10-CM | POA: Diagnosis not present

## 2021-03-05 DIAGNOSIS — N1831 Chronic kidney disease, stage 3a: Secondary | ICD-10-CM | POA: Diagnosis not present

## 2021-03-05 DIAGNOSIS — D631 Anemia in chronic kidney disease: Secondary | ICD-10-CM | POA: Diagnosis not present

## 2021-03-05 DIAGNOSIS — I480 Paroxysmal atrial fibrillation: Secondary | ICD-10-CM | POA: Diagnosis not present

## 2021-03-12 DIAGNOSIS — R399 Unspecified symptoms and signs involving the genitourinary system: Secondary | ICD-10-CM | POA: Diagnosis not present

## 2021-03-13 DIAGNOSIS — S72145D Nondisplaced intertrochanteric fracture of left femur, subsequent encounter for closed fracture with routine healing: Secondary | ICD-10-CM | POA: Diagnosis not present

## 2021-03-13 DIAGNOSIS — N390 Urinary tract infection, site not specified: Secondary | ICD-10-CM | POA: Diagnosis not present

## 2021-03-13 DIAGNOSIS — N1831 Chronic kidney disease, stage 3a: Secondary | ICD-10-CM | POA: Diagnosis not present

## 2021-03-13 DIAGNOSIS — I129 Hypertensive chronic kidney disease with stage 1 through stage 4 chronic kidney disease, or unspecified chronic kidney disease: Secondary | ICD-10-CM | POA: Diagnosis not present

## 2021-03-13 DIAGNOSIS — I480 Paroxysmal atrial fibrillation: Secondary | ICD-10-CM | POA: Diagnosis not present

## 2021-03-13 DIAGNOSIS — D631 Anemia in chronic kidney disease: Secondary | ICD-10-CM | POA: Diagnosis not present

## 2021-03-16 DIAGNOSIS — D631 Anemia in chronic kidney disease: Secondary | ICD-10-CM | POA: Diagnosis not present

## 2021-03-16 DIAGNOSIS — Z853 Personal history of malignant neoplasm of breast: Secondary | ICD-10-CM | POA: Diagnosis not present

## 2021-03-16 DIAGNOSIS — J410 Simple chronic bronchitis: Secondary | ICD-10-CM | POA: Diagnosis not present

## 2021-03-16 DIAGNOSIS — M8580 Other specified disorders of bone density and structure, unspecified site: Secondary | ICD-10-CM | POA: Diagnosis not present

## 2021-03-16 DIAGNOSIS — J310 Chronic rhinitis: Secondary | ICD-10-CM | POA: Diagnosis not present

## 2021-03-16 DIAGNOSIS — N3281 Overactive bladder: Secondary | ICD-10-CM | POA: Diagnosis not present

## 2021-03-16 DIAGNOSIS — S72145D Nondisplaced intertrochanteric fracture of left femur, subsequent encounter for closed fracture with routine healing: Secondary | ICD-10-CM | POA: Diagnosis not present

## 2021-03-16 DIAGNOSIS — G4733 Obstructive sleep apnea (adult) (pediatric): Secondary | ICD-10-CM | POA: Diagnosis not present

## 2021-03-16 DIAGNOSIS — N390 Urinary tract infection, site not specified: Secondary | ICD-10-CM | POA: Diagnosis not present

## 2021-03-16 DIAGNOSIS — N1831 Chronic kidney disease, stage 3a: Secondary | ICD-10-CM | POA: Diagnosis not present

## 2021-03-16 DIAGNOSIS — F32A Depression, unspecified: Secondary | ICD-10-CM | POA: Diagnosis not present

## 2021-03-16 DIAGNOSIS — I129 Hypertensive chronic kidney disease with stage 1 through stage 4 chronic kidney disease, or unspecified chronic kidney disease: Secondary | ICD-10-CM | POA: Diagnosis not present

## 2021-03-16 DIAGNOSIS — H8110 Benign paroxysmal vertigo, unspecified ear: Secondary | ICD-10-CM | POA: Diagnosis not present

## 2021-03-16 DIAGNOSIS — K219 Gastro-esophageal reflux disease without esophagitis: Secondary | ICD-10-CM | POA: Diagnosis not present

## 2021-03-16 DIAGNOSIS — Z8673 Personal history of transient ischemic attack (TIA), and cerebral infarction without residual deficits: Secondary | ICD-10-CM | POA: Diagnosis not present

## 2021-03-16 DIAGNOSIS — R1312 Dysphagia, oropharyngeal phase: Secondary | ICD-10-CM | POA: Diagnosis not present

## 2021-03-16 DIAGNOSIS — M2170 Unequal limb length (acquired), unspecified site: Secondary | ICD-10-CM | POA: Diagnosis not present

## 2021-03-16 DIAGNOSIS — E785 Hyperlipidemia, unspecified: Secondary | ICD-10-CM | POA: Diagnosis not present

## 2021-03-16 DIAGNOSIS — H9190 Unspecified hearing loss, unspecified ear: Secondary | ICD-10-CM | POA: Diagnosis not present

## 2021-03-16 DIAGNOSIS — I480 Paroxysmal atrial fibrillation: Secondary | ICD-10-CM | POA: Diagnosis not present

## 2021-03-16 DIAGNOSIS — J45909 Unspecified asthma, uncomplicated: Secondary | ICD-10-CM | POA: Diagnosis not present

## 2021-03-16 DIAGNOSIS — I890 Lymphedema, not elsewhere classified: Secondary | ICD-10-CM | POA: Diagnosis not present

## 2021-03-16 DIAGNOSIS — K588 Other irritable bowel syndrome: Secondary | ICD-10-CM | POA: Diagnosis not present

## 2021-03-16 DIAGNOSIS — Z7901 Long term (current) use of anticoagulants: Secondary | ICD-10-CM | POA: Diagnosis not present

## 2021-03-16 DIAGNOSIS — Z79891 Long term (current) use of opiate analgesic: Secondary | ICD-10-CM | POA: Diagnosis not present

## 2021-03-17 DIAGNOSIS — I480 Paroxysmal atrial fibrillation: Secondary | ICD-10-CM | POA: Diagnosis not present

## 2021-03-17 DIAGNOSIS — Z7901 Long term (current) use of anticoagulants: Secondary | ICD-10-CM | POA: Diagnosis not present

## 2021-03-17 DIAGNOSIS — I63541 Cerebral infarction due to unspecified occlusion or stenosis of right cerebellar artery: Secondary | ICD-10-CM | POA: Diagnosis not present

## 2021-03-25 DIAGNOSIS — N390 Urinary tract infection, site not specified: Secondary | ICD-10-CM | POA: Diagnosis not present

## 2021-03-26 DIAGNOSIS — D631 Anemia in chronic kidney disease: Secondary | ICD-10-CM | POA: Diagnosis not present

## 2021-03-26 DIAGNOSIS — N390 Urinary tract infection, site not specified: Secondary | ICD-10-CM | POA: Diagnosis not present

## 2021-03-26 DIAGNOSIS — S72145D Nondisplaced intertrochanteric fracture of left femur, subsequent encounter for closed fracture with routine healing: Secondary | ICD-10-CM | POA: Diagnosis not present

## 2021-03-26 DIAGNOSIS — N1831 Chronic kidney disease, stage 3a: Secondary | ICD-10-CM | POA: Diagnosis not present

## 2021-03-26 DIAGNOSIS — I129 Hypertensive chronic kidney disease with stage 1 through stage 4 chronic kidney disease, or unspecified chronic kidney disease: Secondary | ICD-10-CM | POA: Diagnosis not present

## 2021-03-26 DIAGNOSIS — I480 Paroxysmal atrial fibrillation: Secondary | ICD-10-CM | POA: Diagnosis not present

## 2021-04-01 ENCOUNTER — Institutional Professional Consult (permissible substitution): Payer: Self-pay | Admitting: Plastic Surgery

## 2021-04-01 DIAGNOSIS — D631 Anemia in chronic kidney disease: Secondary | ICD-10-CM | POA: Diagnosis not present

## 2021-04-01 DIAGNOSIS — N1831 Chronic kidney disease, stage 3a: Secondary | ICD-10-CM | POA: Diagnosis not present

## 2021-04-01 DIAGNOSIS — I480 Paroxysmal atrial fibrillation: Secondary | ICD-10-CM | POA: Diagnosis not present

## 2021-04-01 DIAGNOSIS — S72145D Nondisplaced intertrochanteric fracture of left femur, subsequent encounter for closed fracture with routine healing: Secondary | ICD-10-CM | POA: Diagnosis not present

## 2021-04-01 DIAGNOSIS — I129 Hypertensive chronic kidney disease with stage 1 through stage 4 chronic kidney disease, or unspecified chronic kidney disease: Secondary | ICD-10-CM | POA: Diagnosis not present

## 2021-04-01 DIAGNOSIS — N390 Urinary tract infection, site not specified: Secondary | ICD-10-CM | POA: Diagnosis not present

## 2021-04-03 ENCOUNTER — Encounter: Payer: Self-pay | Admitting: Plastic Surgery

## 2021-04-03 ENCOUNTER — Other Ambulatory Visit: Payer: Self-pay

## 2021-04-03 ENCOUNTER — Ambulatory Visit (INDEPENDENT_AMBULATORY_CARE_PROVIDER_SITE_OTHER): Payer: Self-pay | Admitting: Plastic Surgery

## 2021-04-03 VITALS — BP 170/75 | HR 56 | Ht 61.0 in | Wt 163.0 lb

## 2021-04-03 DIAGNOSIS — Z411 Encounter for cosmetic surgery: Secondary | ICD-10-CM

## 2021-04-05 NOTE — Progress Notes (Signed)
Referring Provider Donnajean Lopes, MD [address] [place],  [place] [address]   CC:  Torn left earlobe.  [name] is an 88 y.o. female.  HPI: Patient is a 88 year old who presents with a torn left earlobe.  Of note the patient has diabetes and is also on Coumadin.  She is interested in repair the tear is complete  Allergies  Allergen Reactions   Pradaxa [Dabigatran Etexilate Mesylate] Other (See Comments)    INTERNAL BLEEDING   Clindamycin/Lincomycin Other (See Comments)    PT STATES HER DOCTOR TOLD HER NOT TO TAKE CLINDAMYCIN BECAUSE SHE GOT C-DIFF AFTER TAKING AMPICILLIN- "tolerated in 2022" (per spouse)   Latex Other (See Comments)    Blisters    Ampicillin Other (See Comments)    C. Diff after taking ampicillin - "tolerated in 2022" (per husband) Pt has received cephalosporins in 2013, 2014, 2017, 2019, 2022    Sulfamethoxazole Diarrhea    Outpatient Encounter Medications as of 04/03/2021  Medication Sig   acetaminophen (TYLENOL) 325 MG tablet Take 2 tablets (650 mg total) by mouth every 6 (six) hours as needed for mild pain (or Fever >/= 101).   atorvastatin (LIPITOR) 20 MG tablet Take 1 tablet (20 mg total) by mouth daily after supper.   ciprofloxacin (CIPRO) 250 MG tablet Take 1 tablet (250 mg total) by mouth 2 (two) times daily.   estradiol (ESTRACE) 0.1 MG/GM vaginal cream Place 1 Applicatorful vaginally 2 (two) times a week.   hydrALAZINE (APRESOLINE) 10 MG tablet TAKE 1 TABLET (10 MG TOTAL) BY MOUTH AT BEDTIME.   hydrALAZINE (APRESOLINE) 25 MG tablet Take 1 tablet (25 mg total) by mouth at bedtime.   losartan (COZAAR) 25 MG tablet Take 3 tablets (75 mg total) by mouth at bedtime.   metoprolol succinate (TOPROL-XL) 25 MG 24 hr tablet Take 1.5 tablets (37.5 mg total) by mouth daily.   Multiple Vitamins-Minerals (ONE-A-DAY PROACTIVE 65+) TABS Take 1 tablet by mouth daily with breakfast.   pantoprazole (PROTONIX) 40 MG tablet Take 1 tablet (40 mg total) by  mouth daily.   Polyethyl Glyc-Propyl Glyc PF (SYSTANE ULTRA PF) 0.4-0.3 % SOLN Place 1 drop into both eyes at bedtime.   Probiotic Product (VSL#3 PO) Take 1 capsule by mouth in the morning.   Vitamin D, Ergocalciferol, (DRISDOL) 1.25 MG (50000 UNIT) CAPS capsule TAKE 1 CAPSULE (50,000 UNITS TOTAL) BY MOUTH EVERY SEVEN DAYS.   warfarin (COUMADIN) 5 MG tablet Take 1 tablet (5 mg total) by mouth daily.   atorvastatin (LIPITOR) 20 MG tablet TAKE 1 TABLET (20 MG TOTAL) BY MOUTH DAILY AFTER SUPPER.   ciprofloxacin (CIPRO) 250 MG tablet TAKE 1 TABLET (250 MG TOTAL) BY MOUTH TWO TIMES DAILY.   diclofenac Sodium (VOLTAREN) 1 % GEL Apply 2 g topically 4 (four) times daily. (Patient not taking: Reported on 04/03/2021)   diclofenac Sodium (VOLTAREN) 1 % GEL APPLY 2 G TOPICALLY FOUR TIMES DAILY.   docusate sodium (COLACE) 100 MG capsule Take 1 capsule (100 mg total) by mouth 2 (two) times daily.   hydrALAZINE (APRESOLINE) 25 MG tablet TAKE 1 TABLET (25 MG TOTAL) BY MOUTH AT BEDTIME.   losartan (COZAAR) 25 MG tablet TAKE 3 TABLETS (75 MG TOTAL) BY MOUTH AT BEDTIME.   losartan (COZAAR) 25 MG tablet TAKE 3 TABLETS (75 MG TOTAL) BY MOUTH AT BEDTIME.   melatonin 3 MG TABS tablet Take 1 tablet (3 mg total) by mouth at bedtime.   melatonin 3 MG TABS tablet TAKE 1  TABLET (3 MG TOTAL) BY MOUTH AT BEDTIME.   methocarbamol (ROBAXIN) 500 MG tablet Take 1 tablet (500 mg total) by mouth every 6 (six) hours as needed for muscle spasms.   methocarbamol (ROBAXIN) 500 MG tablet TAKE 1 TABLET (500 MG TOTAL) BY MOUTH EVERY SIX HOURS AS NEEDED FOR MUSCLE SPASMS.   metoprolol succinate (TOPROL-XL) 25 MG 24 hr tablet TAKE 1 AND 1/2 TABLETS (37.5 MG TOTAL) BY MOUTH DAILY.   Multiple Vitamins-Minerals (PRESERVISION AREDS) CAPS Take 1 capsule by mouth 2 (two) times daily.   oxyCODONE-acetaminophen (PERCOCET/ROXICET) 5-325 MG tablet Take 1 tablet by mouth every 6 (six) hours as needed for moderate pain.   pantoprazole (PROTONIX) 40 MG  tablet TAKE 1 TABLET (40 MG TOTAL) BY MOUTH DAILY.   vitamin B-12 1000 MCG tablet Take 1 tablet (1,000 mcg total) by mouth daily.   Vitamin D, Ergocalciferol, (DRISDOL) 1.25 MG (50000 UNIT) CAPS capsule Take 1 capsule (50,000 Units total) by mouth every 7 (seven) days.   Vitamin D3 (VITAMIN D) 25 MCG tablet Take 1 tablet (1,000 Units total) by mouth daily.   Vitamin D3 (VITAMIN D) 25 MCG tablet TAKE 1 TABLET (1,000 UNITS TOTAL) BY MOUTH DAILY.   warfarin (COUMADIN) 5 MG tablet TAKE 1 TABLET (5 MG TOTAL) BY MOUTH DAILY.   No facility-administered encounter medications on file as of 04/03/2021.     Past Medical History:  Diagnosis Date   Arthritis    "fingers, hips, feet, ankles" (11/10/2012)   Asthma    Atrial fibrillation ([facility])    CHRONIC COUMADIN   Breast cancer ([facility]) 1990's   "cancer on one side; precancerous tissue on the other" (11/10/2012)   Chronic bronchitis ([facility])    "multiple times; not in a long time" (11/10/2012   Depression    GERD (gastroesophageal reflux disease)    Hearing impaired    Hypertension    Irritable bowel    Lymphedema    HX OF - IN LEFT ARM--NO NEEDLES OR B/P'S LEFT ARM   Macular degeneration    "BEGINNINGS OF" MACULAR DEGENERATION   Osteoporosis    Pneumonia    "multiple times; not in a long time" (11/10/2012)   Recurrent UTI    Sleep apnea    "dx'd w/it; don't wear mask or anything" (11/10/2012)   Stroke ([facility]) ~ 2005   HX OF TIA-NO RESIDUAL PROBLEM--PARALYSIS RT SIDE FACE /PT'S MOUTH DROOPS-AND LOSS OF HEARING RT EAR --SINCE EAR SURGERY AS A CHILD    UTI (lower urinary tract infection)    FREQUENT    Past Surgical History:  Procedure Laterality Date   BREAST BIOPSY Bilateral 1990's   Dacryocystorhinostomy  02/18/2016   INNER EAR SURGERY Right    MULTIPLE EAR SURGERIES,   INTRAMEDULLARY (IM) NAIL INTERTROCHANTERIC Left 04/23/2020   Procedure: INTRAMEDULLARY (IM) NAIL INTERTROCHANTRIC;  Surgeon: Shona Needles, MD;  Location: [facility];  Service:  Orthopedics;  Laterality: Left;   JOINT REPLACEMENT     KNEE ARTHROSCOPY  04/07/2012   Procedure: ARTHROSCOPY KNEE;  Surgeon: Gearlean Alf, MD;  Location: [facility];  Service: Orthopedics;  Laterality: Left;  WITH SYNOVECTOMY   MASTECTOMY Bilateral 1990's   MASTOIDECTOMY Right 1942   ORIF FEMUR FRACTURE Left 08/08/2017   Procedure: OPEN REDUCTION INTERNAL FIXATION (ORIF) DISTAL FEMUR FRACTURE;  Surgeon: Shona Needles, MD;  Location: [facility];  Service: Orthopedics;  Laterality: Left;   TONSILLECTOMY     TOTAL KNEE ARTHROPLASTY  09/20/2011   LEFT TOTAL KNEE ARTHROPLASTY;  Surgeon: Gearlean Alf, MD;  Location: [facility];  Service: Orthopedics;  Laterality: Left;   TOTAL KNEE ARTHROPLASTY  2006    Family History  Problem Relation Age of Onset   CAD Mother    Stroke Mother    CAD Father     Social History   Social History Narrative   Not on file     Review of Systems General: Denies fevers, chills, weight loss CV: Denies chest pain, shortness of breath, palpitations   Physical Exam Vitals with BMI 04/03/2021 05/22/2020 05/21/2020  Height 5\' 1"  - -  Weight 163 lbs 186 lbs 8 oz -  BMI 83.33 32 -  Systolic 832 919 166  Diastolic 75 88 56  Pulse 56 74 68    General:  No acute distress,  Alert and oriented, Non-Toxic, Normal speech and affect HEENT: Complete tear of left earlobe.  Assessment/Plan Discussed with the patient that repair is indicated with a V shaped incision and suturing.  Lennice Sites 04/05/2021, 8:19 PM

## 2021-04-08 DIAGNOSIS — I129 Hypertensive chronic kidney disease with stage 1 through stage 4 chronic kidney disease, or unspecified chronic kidney disease: Secondary | ICD-10-CM | POA: Diagnosis not present

## 2021-04-08 DIAGNOSIS — I480 Paroxysmal atrial fibrillation: Secondary | ICD-10-CM | POA: Diagnosis not present

## 2021-04-08 DIAGNOSIS — S72145D Nondisplaced intertrochanteric fracture of left femur, subsequent encounter for closed fracture with routine healing: Secondary | ICD-10-CM | POA: Diagnosis not present

## 2021-04-08 DIAGNOSIS — N1831 Chronic kidney disease, stage 3a: Secondary | ICD-10-CM | POA: Diagnosis not present

## 2021-04-08 DIAGNOSIS — N390 Urinary tract infection, site not specified: Secondary | ICD-10-CM | POA: Diagnosis not present

## 2021-04-08 DIAGNOSIS — D631 Anemia in chronic kidney disease: Secondary | ICD-10-CM | POA: Diagnosis not present

## 2021-04-14 DIAGNOSIS — R399 Unspecified symptoms and signs involving the genitourinary system: Secondary | ICD-10-CM | POA: Diagnosis not present

## 2021-04-15 DIAGNOSIS — J410 Simple chronic bronchitis: Secondary | ICD-10-CM | POA: Diagnosis not present

## 2021-04-15 DIAGNOSIS — I480 Paroxysmal atrial fibrillation: Secondary | ICD-10-CM | POA: Diagnosis not present

## 2021-04-15 DIAGNOSIS — G4733 Obstructive sleep apnea (adult) (pediatric): Secondary | ICD-10-CM | POA: Diagnosis not present

## 2021-04-15 DIAGNOSIS — M8580 Other specified disorders of bone density and structure, unspecified site: Secondary | ICD-10-CM | POA: Diagnosis not present

## 2021-04-15 DIAGNOSIS — Z7901 Long term (current) use of anticoagulants: Secondary | ICD-10-CM | POA: Diagnosis not present

## 2021-04-15 DIAGNOSIS — S72145D Nondisplaced intertrochanteric fracture of left femur, subsequent encounter for closed fracture with routine healing: Secondary | ICD-10-CM | POA: Diagnosis not present

## 2021-04-15 DIAGNOSIS — Z79891 Long term (current) use of opiate analgesic: Secondary | ICD-10-CM | POA: Diagnosis not present

## 2021-04-15 DIAGNOSIS — K219 Gastro-esophageal reflux disease without esophagitis: Secondary | ICD-10-CM | POA: Diagnosis not present

## 2021-04-15 DIAGNOSIS — N1831 Chronic kidney disease, stage 3a: Secondary | ICD-10-CM | POA: Diagnosis not present

## 2021-04-15 DIAGNOSIS — J45909 Unspecified asthma, uncomplicated: Secondary | ICD-10-CM | POA: Diagnosis not present

## 2021-04-15 DIAGNOSIS — H9190 Unspecified hearing loss, unspecified ear: Secondary | ICD-10-CM | POA: Diagnosis not present

## 2021-04-15 DIAGNOSIS — F32A Depression, unspecified: Secondary | ICD-10-CM | POA: Diagnosis not present

## 2021-04-15 DIAGNOSIS — N390 Urinary tract infection, site not specified: Secondary | ICD-10-CM | POA: Diagnosis not present

## 2021-04-15 DIAGNOSIS — K588 Other irritable bowel syndrome: Secondary | ICD-10-CM | POA: Diagnosis not present

## 2021-04-15 DIAGNOSIS — E785 Hyperlipidemia, unspecified: Secondary | ICD-10-CM | POA: Diagnosis not present

## 2021-04-15 DIAGNOSIS — M2170 Unequal limb length (acquired), unspecified site: Secondary | ICD-10-CM | POA: Diagnosis not present

## 2021-04-15 DIAGNOSIS — D631 Anemia in chronic kidney disease: Secondary | ICD-10-CM | POA: Diagnosis not present

## 2021-04-15 DIAGNOSIS — I890 Lymphedema, not elsewhere classified: Secondary | ICD-10-CM | POA: Diagnosis not present

## 2021-04-15 DIAGNOSIS — R1312 Dysphagia, oropharyngeal phase: Secondary | ICD-10-CM | POA: Diagnosis not present

## 2021-04-15 DIAGNOSIS — I129 Hypertensive chronic kidney disease with stage 1 through stage 4 chronic kidney disease, or unspecified chronic kidney disease: Secondary | ICD-10-CM | POA: Diagnosis not present

## 2021-04-15 DIAGNOSIS — N3281 Overactive bladder: Secondary | ICD-10-CM | POA: Diagnosis not present

## 2021-04-15 DIAGNOSIS — Z853 Personal history of malignant neoplasm of breast: Secondary | ICD-10-CM | POA: Diagnosis not present

## 2021-04-15 DIAGNOSIS — J310 Chronic rhinitis: Secondary | ICD-10-CM | POA: Diagnosis not present

## 2021-04-15 DIAGNOSIS — Z8673 Personal history of transient ischemic attack (TIA), and cerebral infarction without residual deficits: Secondary | ICD-10-CM | POA: Diagnosis not present

## 2021-04-15 DIAGNOSIS — H8110 Benign paroxysmal vertigo, unspecified ear: Secondary | ICD-10-CM | POA: Diagnosis not present

## 2021-04-20 ENCOUNTER — Ambulatory Visit (INDEPENDENT_AMBULATORY_CARE_PROVIDER_SITE_OTHER): Payer: Self-pay | Admitting: Plastic Surgery

## 2021-04-20 ENCOUNTER — Other Ambulatory Visit: Payer: Self-pay

## 2021-04-20 VITALS — BP 144/58 | HR 62 | Ht 64.0 in | Wt 163.0 lb

## 2021-04-20 DIAGNOSIS — Z411 Encounter for cosmetic surgery: Secondary | ICD-10-CM

## 2021-04-21 DIAGNOSIS — Z7901 Long term (current) use of anticoagulants: Secondary | ICD-10-CM | POA: Diagnosis not present

## 2021-04-21 DIAGNOSIS — I480 Paroxysmal atrial fibrillation: Secondary | ICD-10-CM | POA: Diagnosis not present

## 2021-04-21 DIAGNOSIS — I63541 Cerebral infarction due to unspecified occlusion or stenosis of right cerebellar artery: Secondary | ICD-10-CM | POA: Diagnosis not present

## 2021-04-21 NOTE — Progress Notes (Signed)
Procedure note   Preoperative Dx: Left split ear lobe   Postoperative Dx: Left split ear lobe   Procedure: repair of left split ear lobe   Anesthesia: Lidocaine 1% with 1:100,000 epinepherine   Indication for Procedure: Patient with a totally torn left earlobe desires repair.   Description of Procedure: Risks and complications were explained to the patient.  The patient consented to the procedure. Time out was called and all information was confirmed to be correct. The ear lobe was prepped with betadine and drapped.  Lidocaine 1% with epinepherine was injected in the subcutaneous area. After waiting several minutes for the lidocaine to take affect an #11 blade was used to make a V- shaped incision which included cutting out the previous epithelialized hole. A 5-0 Prolene was used to reapproximate the skin edges on the front of the earlobe.  4-0 Prolene was used on the back of the earlobe.  After confirming satisfactory repair a Band-Aid was applied.  Patient is to follow up in 2 weeks. She tolerated the procedure well and there were no complications noted at the time of the procedure.

## 2021-04-22 DIAGNOSIS — N1831 Chronic kidney disease, stage 3a: Secondary | ICD-10-CM | POA: Diagnosis not present

## 2021-04-22 DIAGNOSIS — S72145D Nondisplaced intertrochanteric fracture of left femur, subsequent encounter for closed fracture with routine healing: Secondary | ICD-10-CM | POA: Diagnosis not present

## 2021-04-22 DIAGNOSIS — N390 Urinary tract infection, site not specified: Secondary | ICD-10-CM | POA: Diagnosis not present

## 2021-04-22 DIAGNOSIS — I129 Hypertensive chronic kidney disease with stage 1 through stage 4 chronic kidney disease, or unspecified chronic kidney disease: Secondary | ICD-10-CM | POA: Diagnosis not present

## 2021-04-22 DIAGNOSIS — D631 Anemia in chronic kidney disease: Secondary | ICD-10-CM | POA: Diagnosis not present

## 2021-04-22 DIAGNOSIS — I480 Paroxysmal atrial fibrillation: Secondary | ICD-10-CM | POA: Diagnosis not present

## 2021-05-04 ENCOUNTER — Other Ambulatory Visit: Payer: Self-pay

## 2021-05-04 ENCOUNTER — Ambulatory Visit (INDEPENDENT_AMBULATORY_CARE_PROVIDER_SITE_OTHER): Payer: Self-pay | Admitting: Plastic Surgery

## 2021-05-04 DIAGNOSIS — Z411 Encounter for cosmetic surgery: Secondary | ICD-10-CM

## 2021-05-04 NOTE — Progress Notes (Signed)
Patient is status post repair of the left earlobe.  No complaints happy with the result.  Physical exam Incision clean dry intact, no erythema or hematoma  Assessment and plan Patient is doing well after the loop repair.  We can see her back in 2 to 3 months for ear piercing if she would like or she can do this on the outside.

## 2021-05-11 DIAGNOSIS — R399 Unspecified symptoms and signs involving the genitourinary system: Secondary | ICD-10-CM | POA: Diagnosis not present

## 2021-05-20 DIAGNOSIS — N3000 Acute cystitis without hematuria: Secondary | ICD-10-CM | POA: Diagnosis not present

## 2021-05-25 DIAGNOSIS — B9689 Other specified bacterial agents as the cause of diseases classified elsewhere: Secondary | ICD-10-CM | POA: Diagnosis not present

## 2021-05-25 DIAGNOSIS — N390 Urinary tract infection, site not specified: Secondary | ICD-10-CM | POA: Diagnosis not present

## 2021-05-27 DIAGNOSIS — Z7901 Long term (current) use of anticoagulants: Secondary | ICD-10-CM | POA: Diagnosis not present

## 2021-05-27 DIAGNOSIS — I480 Paroxysmal atrial fibrillation: Secondary | ICD-10-CM | POA: Diagnosis not present

## 2021-05-27 DIAGNOSIS — I63541 Cerebral infarction due to unspecified occlusion or stenosis of right cerebellar artery: Secondary | ICD-10-CM | POA: Diagnosis not present

## 2021-05-27 DIAGNOSIS — N390 Urinary tract infection, site not specified: Secondary | ICD-10-CM | POA: Diagnosis not present

## 2021-05-27 DIAGNOSIS — B9689 Other specified bacterial agents as the cause of diseases classified elsewhere: Secondary | ICD-10-CM | POA: Diagnosis not present

## 2021-05-29 DIAGNOSIS — N390 Urinary tract infection, site not specified: Secondary | ICD-10-CM | POA: Diagnosis not present

## 2021-05-29 DIAGNOSIS — B965 Pseudomonas (aeruginosa) (mallei) (pseudomallei) as the cause of diseases classified elsewhere: Secondary | ICD-10-CM | POA: Diagnosis not present

## 2021-06-05 DIAGNOSIS — N390 Urinary tract infection, site not specified: Secondary | ICD-10-CM | POA: Diagnosis not present

## 2021-06-05 DIAGNOSIS — R399 Unspecified symptoms and signs involving the genitourinary system: Secondary | ICD-10-CM | POA: Diagnosis not present

## 2021-06-10 DIAGNOSIS — I63541 Cerebral infarction due to unspecified occlusion or stenosis of right cerebellar artery: Secondary | ICD-10-CM | POA: Diagnosis not present

## 2021-06-10 DIAGNOSIS — Z7901 Long term (current) use of anticoagulants: Secondary | ICD-10-CM | POA: Diagnosis not present

## 2021-06-10 DIAGNOSIS — I480 Paroxysmal atrial fibrillation: Secondary | ICD-10-CM | POA: Diagnosis not present

## 2021-06-16 DIAGNOSIS — N390 Urinary tract infection, site not specified: Secondary | ICD-10-CM | POA: Diagnosis not present

## 2021-06-22 DIAGNOSIS — N390 Urinary tract infection, site not specified: Secondary | ICD-10-CM | POA: Diagnosis not present

## 2021-06-24 DIAGNOSIS — N390 Urinary tract infection, site not specified: Secondary | ICD-10-CM | POA: Diagnosis not present

## 2021-06-24 DIAGNOSIS — N952 Postmenopausal atrophic vaginitis: Secondary | ICD-10-CM | POA: Diagnosis not present

## 2021-06-24 DIAGNOSIS — R339 Retention of urine, unspecified: Secondary | ICD-10-CM | POA: Diagnosis not present

## 2021-06-24 DIAGNOSIS — K580 Irritable bowel syndrome with diarrhea: Secondary | ICD-10-CM | POA: Diagnosis not present

## 2021-06-26 DIAGNOSIS — N390 Urinary tract infection, site not specified: Secondary | ICD-10-CM | POA: Diagnosis not present

## 2021-06-26 DIAGNOSIS — R3914 Feeling of incomplete bladder emptying: Secondary | ICD-10-CM | POA: Diagnosis not present

## 2021-06-29 ENCOUNTER — Ambulatory Visit: Payer: No Typology Code available for payment source | Admitting: Plastic Surgery

## 2021-07-14 DIAGNOSIS — I63541 Cerebral infarction due to unspecified occlusion or stenosis of right cerebellar artery: Secondary | ICD-10-CM | POA: Diagnosis not present

## 2021-07-14 DIAGNOSIS — I480 Paroxysmal atrial fibrillation: Secondary | ICD-10-CM | POA: Diagnosis not present

## 2021-07-14 DIAGNOSIS — Z7901 Long term (current) use of anticoagulants: Secondary | ICD-10-CM | POA: Diagnosis not present

## 2021-07-15 DIAGNOSIS — Z9889 Other specified postprocedural states: Secondary | ICD-10-CM | POA: Diagnosis not present

## 2021-07-15 DIAGNOSIS — H04551 Acquired stenosis of right nasolacrimal duct: Secondary | ICD-10-CM | POA: Diagnosis not present

## 2021-07-15 DIAGNOSIS — H26491 Other secondary cataract, right eye: Secondary | ICD-10-CM | POA: Diagnosis not present

## 2021-07-15 DIAGNOSIS — H353130 Nonexudative age-related macular degeneration, bilateral, stage unspecified: Secondary | ICD-10-CM | POA: Diagnosis not present

## 2021-07-15 DIAGNOSIS — Z961 Presence of intraocular lens: Secondary | ICD-10-CM | POA: Diagnosis not present

## 2021-07-15 DIAGNOSIS — H02403 Unspecified ptosis of bilateral eyelids: Secondary | ICD-10-CM | POA: Diagnosis not present

## 2021-07-15 DIAGNOSIS — H02002 Unspecified entropion of right lower eyelid: Secondary | ICD-10-CM | POA: Diagnosis not present

## 2021-07-15 DIAGNOSIS — H02052 Trichiasis without entropian right lower eyelid: Secondary | ICD-10-CM | POA: Diagnosis not present

## 2021-07-15 DIAGNOSIS — Z8669 Personal history of other diseases of the nervous system and sense organs: Secondary | ICD-10-CM | POA: Diagnosis not present

## 2021-07-20 ENCOUNTER — Ambulatory Visit (INDEPENDENT_AMBULATORY_CARE_PROVIDER_SITE_OTHER): Payer: Self-pay | Admitting: Plastic Surgery

## 2021-07-20 DIAGNOSIS — Z411 Encounter for cosmetic surgery: Secondary | ICD-10-CM

## 2021-07-22 NOTE — Progress Notes (Signed)
Operative Note  ? ?DATE OF OPERATION: 07/22/2021 ? ?LOCATION:   Office ? ?SURGICAL DEPARTMENT: Plastic Surgery ? ?PREOPERATIVE DIAGNOSES: Left earlobe repair once piercing ? ?POSTOPERATIVE DIAGNOSES:  same ? ?PROCEDURE:  ?Piercing left earlobe ? ?SURGEON: Melene Plan. Terrence Pizana, MD ? ?ANESTHESIA:  Local ? ?COMPLICATIONS: None.  ? ?INDICATIONS FOR PROCEDURE:  ?The patient, [name] is a 88 y.o. female born on 09-03-1933, is here for treatment of left earlobe tear. ?MRN: [id number] ? ?CONSENT:  ?Informed consent was obtained directly from the patient. Risks, benefits and alternatives were fully discussed. Specific risks including but not limited to bleeding, infection, hematoma, seroma, scarring, pain, infection, wound healing problems, and need for further surgery were all discussed. The patient did have an ample opportunity to have questions answered to satisfaction.  ? ?DESCRIPTION OF PROCEDURE:  ?Local anesthesia was administered. The patient's operative site was prepped and draped in a sterile fashion.  The left earlobe was pierced with a piercing device.  Following that the patient confirmed satisfactory position. ?  ?

## 2021-07-28 DIAGNOSIS — M8588 Other specified disorders of bone density and structure, other site: Secondary | ICD-10-CM | POA: Diagnosis not present

## 2021-07-28 DIAGNOSIS — M40204 Unspecified kyphosis, thoracic region: Secondary | ICD-10-CM | POA: Diagnosis not present

## 2021-07-28 DIAGNOSIS — M19011 Primary osteoarthritis, right shoulder: Secondary | ICD-10-CM | POA: Diagnosis not present

## 2021-07-30 DIAGNOSIS — M542 Cervicalgia: Secondary | ICD-10-CM | POA: Diagnosis not present

## 2021-08-10 DIAGNOSIS — S72145D Nondisplaced intertrochanteric fracture of left femur, subsequent encounter for closed fracture with routine healing: Secondary | ICD-10-CM | POA: Diagnosis not present

## 2021-08-10 DIAGNOSIS — Z7901 Long term (current) use of anticoagulants: Secondary | ICD-10-CM | POA: Diagnosis not present

## 2021-08-10 DIAGNOSIS — N390 Urinary tract infection, site not specified: Secondary | ICD-10-CM | POA: Diagnosis not present

## 2021-08-10 DIAGNOSIS — Z8673 Personal history of transient ischemic attack (TIA), and cerebral infarction without residual deficits: Secondary | ICD-10-CM | POA: Diagnosis not present

## 2021-08-10 DIAGNOSIS — H409 Unspecified glaucoma: Secondary | ICD-10-CM | POA: Diagnosis not present

## 2021-08-10 DIAGNOSIS — Z79891 Long term (current) use of opiate analgesic: Secondary | ICD-10-CM | POA: Diagnosis not present

## 2021-08-10 DIAGNOSIS — N189 Chronic kidney disease, unspecified: Secondary | ICD-10-CM | POA: Diagnosis not present

## 2021-08-10 DIAGNOSIS — M25512 Pain in left shoulder: Secondary | ICD-10-CM | POA: Diagnosis not present

## 2021-08-10 DIAGNOSIS — I129 Hypertensive chronic kidney disease with stage 1 through stage 4 chronic kidney disease, or unspecified chronic kidney disease: Secondary | ICD-10-CM | POA: Diagnosis not present

## 2021-08-10 DIAGNOSIS — H269 Unspecified cataract: Secondary | ICD-10-CM | POA: Diagnosis not present

## 2021-08-10 DIAGNOSIS — M542 Cervicalgia: Secondary | ICD-10-CM | POA: Diagnosis not present

## 2021-08-18 DIAGNOSIS — M50322 Other cervical disc degeneration at C5-C6 level: Secondary | ICD-10-CM | POA: Diagnosis not present

## 2021-08-18 DIAGNOSIS — M9901 Segmental and somatic dysfunction of cervical region: Secondary | ICD-10-CM | POA: Diagnosis not present

## 2021-08-20 DIAGNOSIS — M50322 Other cervical disc degeneration at C5-C6 level: Secondary | ICD-10-CM | POA: Diagnosis not present

## 2021-08-20 DIAGNOSIS — Z7901 Long term (current) use of anticoagulants: Secondary | ICD-10-CM | POA: Diagnosis not present

## 2021-08-20 DIAGNOSIS — I480 Paroxysmal atrial fibrillation: Secondary | ICD-10-CM | POA: Diagnosis not present

## 2021-08-20 DIAGNOSIS — M9901 Segmental and somatic dysfunction of cervical region: Secondary | ICD-10-CM | POA: Diagnosis not present

## 2021-08-20 DIAGNOSIS — I63541 Cerebral infarction due to unspecified occlusion or stenosis of right cerebellar artery: Secondary | ICD-10-CM | POA: Diagnosis not present

## 2021-08-25 DIAGNOSIS — M50322 Other cervical disc degeneration at C5-C6 level: Secondary | ICD-10-CM | POA: Diagnosis not present

## 2021-08-25 DIAGNOSIS — M9901 Segmental and somatic dysfunction of cervical region: Secondary | ICD-10-CM | POA: Diagnosis not present

## 2021-08-27 DIAGNOSIS — M50322 Other cervical disc degeneration at C5-C6 level: Secondary | ICD-10-CM | POA: Diagnosis not present

## 2021-08-27 DIAGNOSIS — M9901 Segmental and somatic dysfunction of cervical region: Secondary | ICD-10-CM | POA: Diagnosis not present

## 2021-08-31 DIAGNOSIS — M50322 Other cervical disc degeneration at C5-C6 level: Secondary | ICD-10-CM | POA: Diagnosis not present

## 2021-08-31 DIAGNOSIS — M9901 Segmental and somatic dysfunction of cervical region: Secondary | ICD-10-CM | POA: Diagnosis not present

## 2021-09-01 DIAGNOSIS — N390 Urinary tract infection, site not specified: Secondary | ICD-10-CM | POA: Diagnosis not present

## 2021-09-01 DIAGNOSIS — N189 Chronic kidney disease, unspecified: Secondary | ICD-10-CM | POA: Diagnosis not present

## 2021-09-01 DIAGNOSIS — M542 Cervicalgia: Secondary | ICD-10-CM | POA: Diagnosis not present

## 2021-09-01 DIAGNOSIS — H269 Unspecified cataract: Secondary | ICD-10-CM | POA: Diagnosis not present

## 2021-09-01 DIAGNOSIS — I129 Hypertensive chronic kidney disease with stage 1 through stage 4 chronic kidney disease, or unspecified chronic kidney disease: Secondary | ICD-10-CM | POA: Diagnosis not present

## 2021-09-01 DIAGNOSIS — H409 Unspecified glaucoma: Secondary | ICD-10-CM | POA: Diagnosis not present

## 2021-09-04 DIAGNOSIS — H409 Unspecified glaucoma: Secondary | ICD-10-CM | POA: Diagnosis not present

## 2021-09-04 DIAGNOSIS — N189 Chronic kidney disease, unspecified: Secondary | ICD-10-CM | POA: Diagnosis not present

## 2021-09-04 DIAGNOSIS — N390 Urinary tract infection, site not specified: Secondary | ICD-10-CM | POA: Diagnosis not present

## 2021-09-04 DIAGNOSIS — M542 Cervicalgia: Secondary | ICD-10-CM | POA: Diagnosis not present

## 2021-09-04 DIAGNOSIS — H269 Unspecified cataract: Secondary | ICD-10-CM | POA: Diagnosis not present

## 2021-09-04 DIAGNOSIS — I129 Hypertensive chronic kidney disease with stage 1 through stage 4 chronic kidney disease, or unspecified chronic kidney disease: Secondary | ICD-10-CM | POA: Diagnosis not present

## 2021-09-07 DIAGNOSIS — N189 Chronic kidney disease, unspecified: Secondary | ICD-10-CM | POA: Diagnosis not present

## 2021-09-07 DIAGNOSIS — M542 Cervicalgia: Secondary | ICD-10-CM | POA: Diagnosis not present

## 2021-09-07 DIAGNOSIS — H409 Unspecified glaucoma: Secondary | ICD-10-CM | POA: Diagnosis not present

## 2021-09-07 DIAGNOSIS — I129 Hypertensive chronic kidney disease with stage 1 through stage 4 chronic kidney disease, or unspecified chronic kidney disease: Secondary | ICD-10-CM | POA: Diagnosis not present

## 2021-09-07 DIAGNOSIS — N390 Urinary tract infection, site not specified: Secondary | ICD-10-CM | POA: Diagnosis not present

## 2021-09-07 DIAGNOSIS — H269 Unspecified cataract: Secondary | ICD-10-CM | POA: Diagnosis not present

## 2021-09-08 DIAGNOSIS — N390 Urinary tract infection, site not specified: Secondary | ICD-10-CM | POA: Diagnosis not present

## 2021-09-08 DIAGNOSIS — H409 Unspecified glaucoma: Secondary | ICD-10-CM | POA: Diagnosis not present

## 2021-09-08 DIAGNOSIS — N189 Chronic kidney disease, unspecified: Secondary | ICD-10-CM | POA: Diagnosis not present

## 2021-09-08 DIAGNOSIS — I129 Hypertensive chronic kidney disease with stage 1 through stage 4 chronic kidney disease, or unspecified chronic kidney disease: Secondary | ICD-10-CM | POA: Diagnosis not present

## 2021-09-08 DIAGNOSIS — M542 Cervicalgia: Secondary | ICD-10-CM | POA: Diagnosis not present

## 2021-09-08 DIAGNOSIS — H269 Unspecified cataract: Secondary | ICD-10-CM | POA: Diagnosis not present

## 2021-09-09 DIAGNOSIS — Z7901 Long term (current) use of anticoagulants: Secondary | ICD-10-CM | POA: Diagnosis not present

## 2021-09-09 DIAGNOSIS — N189 Chronic kidney disease, unspecified: Secondary | ICD-10-CM | POA: Diagnosis not present

## 2021-09-09 DIAGNOSIS — H409 Unspecified glaucoma: Secondary | ICD-10-CM | POA: Diagnosis not present

## 2021-09-09 DIAGNOSIS — N390 Urinary tract infection, site not specified: Secondary | ICD-10-CM | POA: Diagnosis not present

## 2021-09-09 DIAGNOSIS — Z8673 Personal history of transient ischemic attack (TIA), and cerebral infarction without residual deficits: Secondary | ICD-10-CM | POA: Diagnosis not present

## 2021-09-09 DIAGNOSIS — H269 Unspecified cataract: Secondary | ICD-10-CM | POA: Diagnosis not present

## 2021-09-09 DIAGNOSIS — Z79891 Long term (current) use of opiate analgesic: Secondary | ICD-10-CM | POA: Diagnosis not present

## 2021-09-09 DIAGNOSIS — M542 Cervicalgia: Secondary | ICD-10-CM | POA: Diagnosis not present

## 2021-09-09 DIAGNOSIS — S72145D Nondisplaced intertrochanteric fracture of left femur, subsequent encounter for closed fracture with routine healing: Secondary | ICD-10-CM | POA: Diagnosis not present

## 2021-09-09 DIAGNOSIS — I129 Hypertensive chronic kidney disease with stage 1 through stage 4 chronic kidney disease, or unspecified chronic kidney disease: Secondary | ICD-10-CM | POA: Diagnosis not present

## 2021-09-09 DIAGNOSIS — M25512 Pain in left shoulder: Secondary | ICD-10-CM | POA: Diagnosis not present

## 2021-09-14 DIAGNOSIS — H409 Unspecified glaucoma: Secondary | ICD-10-CM | POA: Diagnosis not present

## 2021-09-14 DIAGNOSIS — M542 Cervicalgia: Secondary | ICD-10-CM | POA: Diagnosis not present

## 2021-09-14 DIAGNOSIS — N390 Urinary tract infection, site not specified: Secondary | ICD-10-CM | POA: Diagnosis not present

## 2021-09-14 DIAGNOSIS — N189 Chronic kidney disease, unspecified: Secondary | ICD-10-CM | POA: Diagnosis not present

## 2021-09-14 DIAGNOSIS — H269 Unspecified cataract: Secondary | ICD-10-CM | POA: Diagnosis not present

## 2021-09-14 DIAGNOSIS — I129 Hypertensive chronic kidney disease with stage 1 through stage 4 chronic kidney disease, or unspecified chronic kidney disease: Secondary | ICD-10-CM | POA: Diagnosis not present

## 2021-09-15 DIAGNOSIS — L814 Other melanin hyperpigmentation: Secondary | ICD-10-CM | POA: Diagnosis not present

## 2021-09-15 DIAGNOSIS — L821 Other seborrheic keratosis: Secondary | ICD-10-CM | POA: Diagnosis not present

## 2021-09-15 DIAGNOSIS — L578 Other skin changes due to chronic exposure to nonionizing radiation: Secondary | ICD-10-CM | POA: Diagnosis not present

## 2021-09-15 DIAGNOSIS — L570 Actinic keratosis: Secondary | ICD-10-CM | POA: Diagnosis not present

## 2021-09-15 DIAGNOSIS — D229 Melanocytic nevi, unspecified: Secondary | ICD-10-CM | POA: Diagnosis not present

## 2021-09-15 DIAGNOSIS — D2271 Melanocytic nevi of right lower limb, including hip: Secondary | ICD-10-CM | POA: Diagnosis not present

## 2021-09-15 DIAGNOSIS — Z85828 Personal history of other malignant neoplasm of skin: Secondary | ICD-10-CM | POA: Diagnosis not present

## 2021-09-18 DIAGNOSIS — I129 Hypertensive chronic kidney disease with stage 1 through stage 4 chronic kidney disease, or unspecified chronic kidney disease: Secondary | ICD-10-CM | POA: Diagnosis not present

## 2021-09-18 DIAGNOSIS — M542 Cervicalgia: Secondary | ICD-10-CM | POA: Diagnosis not present

## 2021-09-18 DIAGNOSIS — N390 Urinary tract infection, site not specified: Secondary | ICD-10-CM | POA: Diagnosis not present

## 2021-09-18 DIAGNOSIS — H269 Unspecified cataract: Secondary | ICD-10-CM | POA: Diagnosis not present

## 2021-09-18 DIAGNOSIS — N189 Chronic kidney disease, unspecified: Secondary | ICD-10-CM | POA: Diagnosis not present

## 2021-09-18 DIAGNOSIS — H409 Unspecified glaucoma: Secondary | ICD-10-CM | POA: Diagnosis not present

## 2021-09-22 DIAGNOSIS — N189 Chronic kidney disease, unspecified: Secondary | ICD-10-CM | POA: Diagnosis not present

## 2021-09-22 DIAGNOSIS — H269 Unspecified cataract: Secondary | ICD-10-CM | POA: Diagnosis not present

## 2021-09-22 DIAGNOSIS — N390 Urinary tract infection, site not specified: Secondary | ICD-10-CM | POA: Diagnosis not present

## 2021-09-22 DIAGNOSIS — H409 Unspecified glaucoma: Secondary | ICD-10-CM | POA: Diagnosis not present

## 2021-09-22 DIAGNOSIS — M542 Cervicalgia: Secondary | ICD-10-CM | POA: Diagnosis not present

## 2021-09-22 DIAGNOSIS — I129 Hypertensive chronic kidney disease with stage 1 through stage 4 chronic kidney disease, or unspecified chronic kidney disease: Secondary | ICD-10-CM | POA: Diagnosis not present

## 2021-09-25 DIAGNOSIS — N189 Chronic kidney disease, unspecified: Secondary | ICD-10-CM | POA: Diagnosis not present

## 2021-09-25 DIAGNOSIS — N390 Urinary tract infection, site not specified: Secondary | ICD-10-CM | POA: Diagnosis not present

## 2021-09-25 DIAGNOSIS — M542 Cervicalgia: Secondary | ICD-10-CM | POA: Diagnosis not present

## 2021-09-25 DIAGNOSIS — H269 Unspecified cataract: Secondary | ICD-10-CM | POA: Diagnosis not present

## 2021-09-25 DIAGNOSIS — H409 Unspecified glaucoma: Secondary | ICD-10-CM | POA: Diagnosis not present

## 2021-09-25 DIAGNOSIS — I129 Hypertensive chronic kidney disease with stage 1 through stage 4 chronic kidney disease, or unspecified chronic kidney disease: Secondary | ICD-10-CM | POA: Diagnosis not present

## 2021-09-28 DIAGNOSIS — D649 Anemia, unspecified: Secondary | ICD-10-CM | POA: Diagnosis not present

## 2021-09-28 DIAGNOSIS — M810 Age-related osteoporosis without current pathological fracture: Secondary | ICD-10-CM | POA: Diagnosis not present

## 2021-09-28 DIAGNOSIS — E785 Hyperlipidemia, unspecified: Secondary | ICD-10-CM | POA: Diagnosis not present

## 2021-09-28 DIAGNOSIS — I10 Essential (primary) hypertension: Secondary | ICD-10-CM | POA: Diagnosis not present

## 2021-09-28 DIAGNOSIS — Z7901 Long term (current) use of anticoagulants: Secondary | ICD-10-CM | POA: Diagnosis not present

## 2021-10-02 DIAGNOSIS — Z7901 Long term (current) use of anticoagulants: Secondary | ICD-10-CM | POA: Diagnosis not present

## 2021-10-02 DIAGNOSIS — R2681 Unsteadiness on feet: Secondary | ICD-10-CM | POA: Diagnosis not present

## 2021-10-02 DIAGNOSIS — I872 Venous insufficiency (chronic) (peripheral): Secondary | ICD-10-CM | POA: Diagnosis not present

## 2021-10-02 DIAGNOSIS — M542 Cervicalgia: Secondary | ICD-10-CM | POA: Diagnosis not present

## 2021-10-02 DIAGNOSIS — Z1339 Encounter for screening examination for other mental health and behavioral disorders: Secondary | ICD-10-CM | POA: Diagnosis not present

## 2021-10-02 DIAGNOSIS — I480 Paroxysmal atrial fibrillation: Secondary | ICD-10-CM | POA: Diagnosis not present

## 2021-10-02 DIAGNOSIS — N390 Urinary tract infection, site not specified: Secondary | ICD-10-CM | POA: Diagnosis not present

## 2021-10-02 DIAGNOSIS — D649 Anemia, unspecified: Secondary | ICD-10-CM | POA: Diagnosis not present

## 2021-10-02 DIAGNOSIS — Z1331 Encounter for screening for depression: Secondary | ICD-10-CM | POA: Diagnosis not present

## 2021-10-02 DIAGNOSIS — H9192 Unspecified hearing loss, left ear: Secondary | ICD-10-CM | POA: Diagnosis not present

## 2021-10-02 DIAGNOSIS — I129 Hypertensive chronic kidney disease with stage 1 through stage 4 chronic kidney disease, or unspecified chronic kidney disease: Secondary | ICD-10-CM | POA: Diagnosis not present

## 2021-10-02 DIAGNOSIS — Z0000 Encounter for general adult medical examination without abnormal findings: Secondary | ICD-10-CM | POA: Diagnosis not present

## 2021-10-02 DIAGNOSIS — Z853 Personal history of malignant neoplasm of breast: Secondary | ICD-10-CM | POA: Diagnosis not present

## 2021-10-02 DIAGNOSIS — N1832 Chronic kidney disease, stage 3b: Secondary | ICD-10-CM | POA: Diagnosis not present

## 2021-10-02 DIAGNOSIS — D6859 Other primary thrombophilia: Secondary | ICD-10-CM | POA: Diagnosis not present

## 2021-10-05 DIAGNOSIS — N390 Urinary tract infection, site not specified: Secondary | ICD-10-CM | POA: Diagnosis not present

## 2021-10-06 DIAGNOSIS — H409 Unspecified glaucoma: Secondary | ICD-10-CM | POA: Diagnosis not present

## 2021-10-06 DIAGNOSIS — I129 Hypertensive chronic kidney disease with stage 1 through stage 4 chronic kidney disease, or unspecified chronic kidney disease: Secondary | ICD-10-CM | POA: Diagnosis not present

## 2021-10-06 DIAGNOSIS — M542 Cervicalgia: Secondary | ICD-10-CM | POA: Diagnosis not present

## 2021-10-06 DIAGNOSIS — N390 Urinary tract infection, site not specified: Secondary | ICD-10-CM | POA: Diagnosis not present

## 2021-10-06 DIAGNOSIS — N189 Chronic kidney disease, unspecified: Secondary | ICD-10-CM | POA: Diagnosis not present

## 2021-10-06 DIAGNOSIS — H269 Unspecified cataract: Secondary | ICD-10-CM | POA: Diagnosis not present

## 2021-10-07 DIAGNOSIS — N390 Urinary tract infection, site not specified: Secondary | ICD-10-CM | POA: Diagnosis not present

## 2021-10-08 DIAGNOSIS — N189 Chronic kidney disease, unspecified: Secondary | ICD-10-CM | POA: Diagnosis not present

## 2021-10-08 DIAGNOSIS — N390 Urinary tract infection, site not specified: Secondary | ICD-10-CM | POA: Diagnosis not present

## 2021-10-08 DIAGNOSIS — H409 Unspecified glaucoma: Secondary | ICD-10-CM | POA: Diagnosis not present

## 2021-10-08 DIAGNOSIS — I129 Hypertensive chronic kidney disease with stage 1 through stage 4 chronic kidney disease, or unspecified chronic kidney disease: Secondary | ICD-10-CM | POA: Diagnosis not present

## 2021-10-08 DIAGNOSIS — M542 Cervicalgia: Secondary | ICD-10-CM | POA: Diagnosis not present

## 2021-10-08 DIAGNOSIS — H269 Unspecified cataract: Secondary | ICD-10-CM | POA: Diagnosis not present

## 2021-10-09 DIAGNOSIS — Z87440 Personal history of urinary (tract) infections: Secondary | ICD-10-CM | POA: Diagnosis not present

## 2021-10-09 DIAGNOSIS — H269 Unspecified cataract: Secondary | ICD-10-CM | POA: Diagnosis not present

## 2021-10-09 DIAGNOSIS — M542 Cervicalgia: Secondary | ICD-10-CM | POA: Diagnosis not present

## 2021-10-09 DIAGNOSIS — M25512 Pain in left shoulder: Secondary | ICD-10-CM | POA: Diagnosis not present

## 2021-10-09 DIAGNOSIS — S72145D Nondisplaced intertrochanteric fracture of left femur, subsequent encounter for closed fracture with routine healing: Secondary | ICD-10-CM | POA: Diagnosis not present

## 2021-10-09 DIAGNOSIS — I129 Hypertensive chronic kidney disease with stage 1 through stage 4 chronic kidney disease, or unspecified chronic kidney disease: Secondary | ICD-10-CM | POA: Diagnosis not present

## 2021-10-09 DIAGNOSIS — Z7901 Long term (current) use of anticoagulants: Secondary | ICD-10-CM | POA: Diagnosis not present

## 2021-10-09 DIAGNOSIS — H409 Unspecified glaucoma: Secondary | ICD-10-CM | POA: Diagnosis not present

## 2021-10-09 DIAGNOSIS — N189 Chronic kidney disease, unspecified: Secondary | ICD-10-CM | POA: Diagnosis not present

## 2021-10-09 DIAGNOSIS — Z79891 Long term (current) use of opiate analgesic: Secondary | ICD-10-CM | POA: Diagnosis not present

## 2021-10-09 DIAGNOSIS — Z8673 Personal history of transient ischemic attack (TIA), and cerebral infarction without residual deficits: Secondary | ICD-10-CM | POA: Diagnosis not present

## 2021-10-12 DIAGNOSIS — M50322 Other cervical disc degeneration at C5-C6 level: Secondary | ICD-10-CM | POA: Diagnosis not present

## 2021-10-12 DIAGNOSIS — M9901 Segmental and somatic dysfunction of cervical region: Secondary | ICD-10-CM | POA: Diagnosis not present

## 2021-10-13 DIAGNOSIS — Z7901 Long term (current) use of anticoagulants: Secondary | ICD-10-CM | POA: Diagnosis not present

## 2021-10-13 DIAGNOSIS — I480 Paroxysmal atrial fibrillation: Secondary | ICD-10-CM | POA: Diagnosis not present

## 2021-10-13 DIAGNOSIS — H269 Unspecified cataract: Secondary | ICD-10-CM | POA: Diagnosis not present

## 2021-10-13 DIAGNOSIS — M25512 Pain in left shoulder: Secondary | ICD-10-CM | POA: Diagnosis not present

## 2021-10-13 DIAGNOSIS — I129 Hypertensive chronic kidney disease with stage 1 through stage 4 chronic kidney disease, or unspecified chronic kidney disease: Secondary | ICD-10-CM | POA: Diagnosis not present

## 2021-10-13 DIAGNOSIS — M542 Cervicalgia: Secondary | ICD-10-CM | POA: Diagnosis not present

## 2021-10-13 DIAGNOSIS — H409 Unspecified glaucoma: Secondary | ICD-10-CM | POA: Diagnosis not present

## 2021-10-13 DIAGNOSIS — N189 Chronic kidney disease, unspecified: Secondary | ICD-10-CM | POA: Diagnosis not present

## 2021-10-14 DIAGNOSIS — M25562 Pain in left knee: Secondary | ICD-10-CM | POA: Diagnosis not present

## 2021-10-16 DIAGNOSIS — N189 Chronic kidney disease, unspecified: Secondary | ICD-10-CM | POA: Diagnosis not present

## 2021-10-16 DIAGNOSIS — I129 Hypertensive chronic kidney disease with stage 1 through stage 4 chronic kidney disease, or unspecified chronic kidney disease: Secondary | ICD-10-CM | POA: Diagnosis not present

## 2021-10-16 DIAGNOSIS — M542 Cervicalgia: Secondary | ICD-10-CM | POA: Diagnosis not present

## 2021-10-16 DIAGNOSIS — M25512 Pain in left shoulder: Secondary | ICD-10-CM | POA: Diagnosis not present

## 2021-10-16 DIAGNOSIS — H409 Unspecified glaucoma: Secondary | ICD-10-CM | POA: Diagnosis not present

## 2021-10-16 DIAGNOSIS — H269 Unspecified cataract: Secondary | ICD-10-CM | POA: Diagnosis not present

## 2021-10-21 DIAGNOSIS — M25512 Pain in left shoulder: Secondary | ICD-10-CM | POA: Diagnosis not present

## 2021-10-21 DIAGNOSIS — H409 Unspecified glaucoma: Secondary | ICD-10-CM | POA: Diagnosis not present

## 2021-10-21 DIAGNOSIS — H269 Unspecified cataract: Secondary | ICD-10-CM | POA: Diagnosis not present

## 2021-10-21 DIAGNOSIS — N189 Chronic kidney disease, unspecified: Secondary | ICD-10-CM | POA: Diagnosis not present

## 2021-10-21 DIAGNOSIS — M542 Cervicalgia: Secondary | ICD-10-CM | POA: Diagnosis not present

## 2021-10-21 DIAGNOSIS — N390 Urinary tract infection, site not specified: Secondary | ICD-10-CM | POA: Diagnosis not present

## 2021-10-21 DIAGNOSIS — I129 Hypertensive chronic kidney disease with stage 1 through stage 4 chronic kidney disease, or unspecified chronic kidney disease: Secondary | ICD-10-CM | POA: Diagnosis not present

## 2021-10-23 DIAGNOSIS — M542 Cervicalgia: Secondary | ICD-10-CM | POA: Diagnosis not present

## 2021-10-23 DIAGNOSIS — M25512 Pain in left shoulder: Secondary | ICD-10-CM | POA: Diagnosis not present

## 2021-10-23 DIAGNOSIS — I129 Hypertensive chronic kidney disease with stage 1 through stage 4 chronic kidney disease, or unspecified chronic kidney disease: Secondary | ICD-10-CM | POA: Diagnosis not present

## 2021-10-23 DIAGNOSIS — H269 Unspecified cataract: Secondary | ICD-10-CM | POA: Diagnosis not present

## 2021-10-23 DIAGNOSIS — N189 Chronic kidney disease, unspecified: Secondary | ICD-10-CM | POA: Diagnosis not present

## 2021-10-23 DIAGNOSIS — H409 Unspecified glaucoma: Secondary | ICD-10-CM | POA: Diagnosis not present

## 2021-10-27 DIAGNOSIS — N189 Chronic kidney disease, unspecified: Secondary | ICD-10-CM | POA: Diagnosis not present

## 2021-10-27 DIAGNOSIS — M25512 Pain in left shoulder: Secondary | ICD-10-CM | POA: Diagnosis not present

## 2021-10-27 DIAGNOSIS — H269 Unspecified cataract: Secondary | ICD-10-CM | POA: Diagnosis not present

## 2021-10-27 DIAGNOSIS — M542 Cervicalgia: Secondary | ICD-10-CM | POA: Diagnosis not present

## 2021-10-27 DIAGNOSIS — H409 Unspecified glaucoma: Secondary | ICD-10-CM | POA: Diagnosis not present

## 2021-10-27 DIAGNOSIS — I129 Hypertensive chronic kidney disease with stage 1 through stage 4 chronic kidney disease, or unspecified chronic kidney disease: Secondary | ICD-10-CM | POA: Diagnosis not present

## 2021-10-30 DIAGNOSIS — H409 Unspecified glaucoma: Secondary | ICD-10-CM | POA: Diagnosis not present

## 2021-10-30 DIAGNOSIS — M25512 Pain in left shoulder: Secondary | ICD-10-CM | POA: Diagnosis not present

## 2021-10-30 DIAGNOSIS — H269 Unspecified cataract: Secondary | ICD-10-CM | POA: Diagnosis not present

## 2021-10-30 DIAGNOSIS — N189 Chronic kidney disease, unspecified: Secondary | ICD-10-CM | POA: Diagnosis not present

## 2021-10-30 DIAGNOSIS — I129 Hypertensive chronic kidney disease with stage 1 through stage 4 chronic kidney disease, or unspecified chronic kidney disease: Secondary | ICD-10-CM | POA: Diagnosis not present

## 2021-10-30 DIAGNOSIS — M542 Cervicalgia: Secondary | ICD-10-CM | POA: Diagnosis not present

## 2021-11-03 DIAGNOSIS — H269 Unspecified cataract: Secondary | ICD-10-CM | POA: Diagnosis not present

## 2021-11-03 DIAGNOSIS — M542 Cervicalgia: Secondary | ICD-10-CM | POA: Diagnosis not present

## 2021-11-03 DIAGNOSIS — H409 Unspecified glaucoma: Secondary | ICD-10-CM | POA: Diagnosis not present

## 2021-11-03 DIAGNOSIS — N189 Chronic kidney disease, unspecified: Secondary | ICD-10-CM | POA: Diagnosis not present

## 2021-11-03 DIAGNOSIS — I129 Hypertensive chronic kidney disease with stage 1 through stage 4 chronic kidney disease, or unspecified chronic kidney disease: Secondary | ICD-10-CM | POA: Diagnosis not present

## 2021-11-03 DIAGNOSIS — M25512 Pain in left shoulder: Secondary | ICD-10-CM | POA: Diagnosis not present

## 2021-11-05 DIAGNOSIS — N189 Chronic kidney disease, unspecified: Secondary | ICD-10-CM | POA: Diagnosis not present

## 2021-11-05 DIAGNOSIS — M25512 Pain in left shoulder: Secondary | ICD-10-CM | POA: Diagnosis not present

## 2021-11-05 DIAGNOSIS — M542 Cervicalgia: Secondary | ICD-10-CM | POA: Diagnosis not present

## 2021-11-05 DIAGNOSIS — I129 Hypertensive chronic kidney disease with stage 1 through stage 4 chronic kidney disease, or unspecified chronic kidney disease: Secondary | ICD-10-CM | POA: Diagnosis not present

## 2021-11-05 DIAGNOSIS — H269 Unspecified cataract: Secondary | ICD-10-CM | POA: Diagnosis not present

## 2021-11-05 DIAGNOSIS — H409 Unspecified glaucoma: Secondary | ICD-10-CM | POA: Diagnosis not present

## 2021-11-08 DIAGNOSIS — Z7901 Long term (current) use of anticoagulants: Secondary | ICD-10-CM | POA: Diagnosis not present

## 2021-11-08 DIAGNOSIS — Z79891 Long term (current) use of opiate analgesic: Secondary | ICD-10-CM | POA: Diagnosis not present

## 2021-11-08 DIAGNOSIS — H269 Unspecified cataract: Secondary | ICD-10-CM | POA: Diagnosis not present

## 2021-11-08 DIAGNOSIS — M542 Cervicalgia: Secondary | ICD-10-CM | POA: Diagnosis not present

## 2021-11-08 DIAGNOSIS — N189 Chronic kidney disease, unspecified: Secondary | ICD-10-CM | POA: Diagnosis not present

## 2021-11-08 DIAGNOSIS — Z8673 Personal history of transient ischemic attack (TIA), and cerebral infarction without residual deficits: Secondary | ICD-10-CM | POA: Diagnosis not present

## 2021-11-08 DIAGNOSIS — H409 Unspecified glaucoma: Secondary | ICD-10-CM | POA: Diagnosis not present

## 2021-11-08 DIAGNOSIS — S72145D Nondisplaced intertrochanteric fracture of left femur, subsequent encounter for closed fracture with routine healing: Secondary | ICD-10-CM | POA: Diagnosis not present

## 2021-11-08 DIAGNOSIS — Z87440 Personal history of urinary (tract) infections: Secondary | ICD-10-CM | POA: Diagnosis not present

## 2021-11-08 DIAGNOSIS — M25512 Pain in left shoulder: Secondary | ICD-10-CM | POA: Diagnosis not present

## 2021-11-08 DIAGNOSIS — I129 Hypertensive chronic kidney disease with stage 1 through stage 4 chronic kidney disease, or unspecified chronic kidney disease: Secondary | ICD-10-CM | POA: Diagnosis not present

## 2021-11-10 DIAGNOSIS — H269 Unspecified cataract: Secondary | ICD-10-CM | POA: Diagnosis not present

## 2021-11-10 DIAGNOSIS — M25512 Pain in left shoulder: Secondary | ICD-10-CM | POA: Diagnosis not present

## 2021-11-10 DIAGNOSIS — N189 Chronic kidney disease, unspecified: Secondary | ICD-10-CM | POA: Diagnosis not present

## 2021-11-10 DIAGNOSIS — M542 Cervicalgia: Secondary | ICD-10-CM | POA: Diagnosis not present

## 2021-11-10 DIAGNOSIS — H409 Unspecified glaucoma: Secondary | ICD-10-CM | POA: Diagnosis not present

## 2021-11-10 DIAGNOSIS — I129 Hypertensive chronic kidney disease with stage 1 through stage 4 chronic kidney disease, or unspecified chronic kidney disease: Secondary | ICD-10-CM | POA: Diagnosis not present

## 2021-11-13 ENCOUNTER — Ambulatory Visit: Payer: No Typology Code available for payment source | Admitting: Physician Assistant

## 2021-11-13 DIAGNOSIS — N189 Chronic kidney disease, unspecified: Secondary | ICD-10-CM | POA: Diagnosis not present

## 2021-11-13 DIAGNOSIS — M542 Cervicalgia: Secondary | ICD-10-CM | POA: Diagnosis not present

## 2021-11-13 DIAGNOSIS — M25512 Pain in left shoulder: Secondary | ICD-10-CM | POA: Diagnosis not present

## 2021-11-13 DIAGNOSIS — I129 Hypertensive chronic kidney disease with stage 1 through stage 4 chronic kidney disease, or unspecified chronic kidney disease: Secondary | ICD-10-CM | POA: Diagnosis not present

## 2021-11-13 DIAGNOSIS — H409 Unspecified glaucoma: Secondary | ICD-10-CM | POA: Diagnosis not present

## 2021-11-13 DIAGNOSIS — H269 Unspecified cataract: Secondary | ICD-10-CM | POA: Diagnosis not present

## 2021-11-17 DIAGNOSIS — Z7901 Long term (current) use of anticoagulants: Secondary | ICD-10-CM | POA: Diagnosis not present

## 2021-11-17 DIAGNOSIS — H269 Unspecified cataract: Secondary | ICD-10-CM | POA: Diagnosis not present

## 2021-11-17 DIAGNOSIS — I129 Hypertensive chronic kidney disease with stage 1 through stage 4 chronic kidney disease, or unspecified chronic kidney disease: Secondary | ICD-10-CM | POA: Diagnosis not present

## 2021-11-17 DIAGNOSIS — I63541 Cerebral infarction due to unspecified occlusion or stenosis of right cerebellar artery: Secondary | ICD-10-CM | POA: Diagnosis not present

## 2021-11-17 DIAGNOSIS — H409 Unspecified glaucoma: Secondary | ICD-10-CM | POA: Diagnosis not present

## 2021-11-17 DIAGNOSIS — N189 Chronic kidney disease, unspecified: Secondary | ICD-10-CM | POA: Diagnosis not present

## 2021-11-17 DIAGNOSIS — M542 Cervicalgia: Secondary | ICD-10-CM | POA: Diagnosis not present

## 2021-11-17 DIAGNOSIS — I480 Paroxysmal atrial fibrillation: Secondary | ICD-10-CM | POA: Diagnosis not present

## 2021-11-17 DIAGNOSIS — M25512 Pain in left shoulder: Secondary | ICD-10-CM | POA: Diagnosis not present

## 2021-11-20 DIAGNOSIS — M542 Cervicalgia: Secondary | ICD-10-CM | POA: Diagnosis not present

## 2021-11-20 DIAGNOSIS — M25512 Pain in left shoulder: Secondary | ICD-10-CM | POA: Diagnosis not present

## 2021-11-20 DIAGNOSIS — H269 Unspecified cataract: Secondary | ICD-10-CM | POA: Diagnosis not present

## 2021-11-20 DIAGNOSIS — H409 Unspecified glaucoma: Secondary | ICD-10-CM | POA: Diagnosis not present

## 2021-11-20 DIAGNOSIS — N189 Chronic kidney disease, unspecified: Secondary | ICD-10-CM | POA: Diagnosis not present

## 2021-11-20 DIAGNOSIS — I129 Hypertensive chronic kidney disease with stage 1 through stage 4 chronic kidney disease, or unspecified chronic kidney disease: Secondary | ICD-10-CM | POA: Diagnosis not present

## 2021-11-25 DIAGNOSIS — M542 Cervicalgia: Secondary | ICD-10-CM | POA: Diagnosis not present

## 2021-11-25 DIAGNOSIS — H409 Unspecified glaucoma: Secondary | ICD-10-CM | POA: Diagnosis not present

## 2021-11-25 DIAGNOSIS — N189 Chronic kidney disease, unspecified: Secondary | ICD-10-CM | POA: Diagnosis not present

## 2021-11-25 DIAGNOSIS — M25512 Pain in left shoulder: Secondary | ICD-10-CM | POA: Diagnosis not present

## 2021-11-25 DIAGNOSIS — H269 Unspecified cataract: Secondary | ICD-10-CM | POA: Diagnosis not present

## 2021-11-25 DIAGNOSIS — I129 Hypertensive chronic kidney disease with stage 1 through stage 4 chronic kidney disease, or unspecified chronic kidney disease: Secondary | ICD-10-CM | POA: Diagnosis not present

## 2021-11-27 ENCOUNTER — Ambulatory Visit (INDEPENDENT_AMBULATORY_CARE_PROVIDER_SITE_OTHER): Payer: Self-pay | Admitting: Plastic Surgery

## 2021-11-27 ENCOUNTER — Encounter: Payer: Self-pay | Admitting: Plastic Surgery

## 2021-11-27 DIAGNOSIS — M25512 Pain in left shoulder: Secondary | ICD-10-CM | POA: Diagnosis not present

## 2021-11-27 DIAGNOSIS — N189 Chronic kidney disease, unspecified: Secondary | ICD-10-CM | POA: Diagnosis not present

## 2021-11-27 DIAGNOSIS — Z411 Encounter for cosmetic surgery: Secondary | ICD-10-CM

## 2021-11-27 DIAGNOSIS — H409 Unspecified glaucoma: Secondary | ICD-10-CM | POA: Diagnosis not present

## 2021-11-27 DIAGNOSIS — H269 Unspecified cataract: Secondary | ICD-10-CM | POA: Diagnosis not present

## 2021-11-27 DIAGNOSIS — M542 Cervicalgia: Secondary | ICD-10-CM | POA: Diagnosis not present

## 2021-11-27 DIAGNOSIS — I129 Hypertensive chronic kidney disease with stage 1 through stage 4 chronic kidney disease, or unspecified chronic kidney disease: Secondary | ICD-10-CM | POA: Diagnosis not present

## 2021-11-28 NOTE — Progress Notes (Signed)
Left ear was repierce without complications.  Piercing kit was used the area was cleaned with alcohol.  Numbing cream was placed prior to the procedure.

## 2021-12-01 DIAGNOSIS — N189 Chronic kidney disease, unspecified: Secondary | ICD-10-CM | POA: Diagnosis not present

## 2021-12-01 DIAGNOSIS — I129 Hypertensive chronic kidney disease with stage 1 through stage 4 chronic kidney disease, or unspecified chronic kidney disease: Secondary | ICD-10-CM | POA: Diagnosis not present

## 2021-12-01 DIAGNOSIS — H269 Unspecified cataract: Secondary | ICD-10-CM | POA: Diagnosis not present

## 2021-12-01 DIAGNOSIS — M25512 Pain in left shoulder: Secondary | ICD-10-CM | POA: Diagnosis not present

## 2021-12-01 DIAGNOSIS — R399 Unspecified symptoms and signs involving the genitourinary system: Secondary | ICD-10-CM | POA: Diagnosis not present

## 2021-12-01 DIAGNOSIS — H409 Unspecified glaucoma: Secondary | ICD-10-CM | POA: Diagnosis not present

## 2021-12-01 DIAGNOSIS — M542 Cervicalgia: Secondary | ICD-10-CM | POA: Diagnosis not present

## 2021-12-02 DIAGNOSIS — M542 Cervicalgia: Secondary | ICD-10-CM | POA: Diagnosis not present

## 2021-12-02 DIAGNOSIS — H90A31 Mixed conductive and sensorineural hearing loss, unilateral, right ear with restricted hearing on the contralateral side: Secondary | ICD-10-CM | POA: Diagnosis not present

## 2021-12-02 DIAGNOSIS — H409 Unspecified glaucoma: Secondary | ICD-10-CM | POA: Diagnosis not present

## 2021-12-02 DIAGNOSIS — H90A22 Sensorineural hearing loss, unilateral, left ear, with restricted hearing on the contralateral side: Secondary | ICD-10-CM | POA: Diagnosis not present

## 2021-12-02 DIAGNOSIS — M25512 Pain in left shoulder: Secondary | ICD-10-CM | POA: Diagnosis not present

## 2021-12-02 DIAGNOSIS — N189 Chronic kidney disease, unspecified: Secondary | ICD-10-CM | POA: Diagnosis not present

## 2021-12-02 DIAGNOSIS — I129 Hypertensive chronic kidney disease with stage 1 through stage 4 chronic kidney disease, or unspecified chronic kidney disease: Secondary | ICD-10-CM | POA: Diagnosis not present

## 2021-12-02 DIAGNOSIS — H269 Unspecified cataract: Secondary | ICD-10-CM | POA: Diagnosis not present

## 2021-12-04 DIAGNOSIS — H903 Sensorineural hearing loss, bilateral: Secondary | ICD-10-CM | POA: Diagnosis not present

## 2021-12-14 DIAGNOSIS — Z23 Encounter for immunization: Secondary | ICD-10-CM | POA: Diagnosis not present

## 2021-12-17 DIAGNOSIS — Z7901 Long term (current) use of anticoagulants: Secondary | ICD-10-CM | POA: Diagnosis not present

## 2021-12-17 DIAGNOSIS — I480 Paroxysmal atrial fibrillation: Secondary | ICD-10-CM | POA: Diagnosis not present

## 2021-12-17 DIAGNOSIS — R399 Unspecified symptoms and signs involving the genitourinary system: Secondary | ICD-10-CM | POA: Diagnosis not present

## 2021-12-17 DIAGNOSIS — I63541 Cerebral infarction due to unspecified occlusion or stenosis of right cerebellar artery: Secondary | ICD-10-CM | POA: Diagnosis not present

## 2021-12-21 DIAGNOSIS — M9901 Segmental and somatic dysfunction of cervical region: Secondary | ICD-10-CM | POA: Diagnosis not present

## 2021-12-21 DIAGNOSIS — M50322 Other cervical disc degeneration at C5-C6 level: Secondary | ICD-10-CM | POA: Diagnosis not present

## 2021-12-23 DIAGNOSIS — Z23 Encounter for immunization: Secondary | ICD-10-CM | POA: Diagnosis not present

## 2021-12-29 DIAGNOSIS — M50322 Other cervical disc degeneration at C5-C6 level: Secondary | ICD-10-CM | POA: Diagnosis not present

## 2021-12-29 DIAGNOSIS — M9901 Segmental and somatic dysfunction of cervical region: Secondary | ICD-10-CM | POA: Diagnosis not present

## 2022-01-05 DIAGNOSIS — N390 Urinary tract infection, site not specified: Secondary | ICD-10-CM | POA: Diagnosis not present

## 2022-01-11 ENCOUNTER — Encounter: Payer: Self-pay | Admitting: Cardiology

## 2022-01-11 ENCOUNTER — Ambulatory Visit: Payer: Medicare Other | Attending: Cardiology | Admitting: Cardiology

## 2022-01-11 VITALS — BP 130/70 | HR 57 | Ht 64.0 in | Wt 165.0 lb

## 2022-01-11 DIAGNOSIS — I10 Essential (primary) hypertension: Secondary | ICD-10-CM | POA: Insufficient documentation

## 2022-01-11 DIAGNOSIS — M50322 Other cervical disc degeneration at C5-C6 level: Secondary | ICD-10-CM | POA: Diagnosis not present

## 2022-01-11 DIAGNOSIS — I480 Paroxysmal atrial fibrillation: Secondary | ICD-10-CM | POA: Insufficient documentation

## 2022-01-11 DIAGNOSIS — M9901 Segmental and somatic dysfunction of cervical region: Secondary | ICD-10-CM | POA: Diagnosis not present

## 2022-01-11 MED ORDER — METOPROLOL SUCCINATE ER 25 MG PO TB24: 25.0000 mg | ORAL_TABLET | Freq: Every day | ORAL | 0 refills | Status: DC | PRN

## 2022-01-11 NOTE — Patient Instructions (Signed)
Medication Instructions:  You may take your metoprolol 25 mg once a day as needed. Continue all other medications as listed.  *If you need a refill on your cardiac medications before your next appointment, please call your pharmacy*  Follow-Up: At [facility], you and your health needs are our priority.  As part of our continuing mission to provide you with exceptional heart care, we have created designated Provider Care Teams.  These Care Teams include your primary Cardiologist (physician) and Advanced Practice Providers (APPs -  Physician Assistants and Nurse Practitioners) who all work together to provide you with the care you need, when you need it.  We recommend signing up for the patient portal called "MyChart".  Sign up information is provided on this After Visit Summary.  MyChart is used to connect with patients for Virtual Visits (Telemedicine).  Patients are able to view lab/test results, encounter notes, upcoming appointments, etc.  Non-urgent messages can be sent to your provider as well.   To learn more about what you can do with MyChart, go to [email or web address].    Your next appointment:   1 year(s)  The format for your next appointment:   In Person  Provider:   Candee Furbish, MD     Important Information About Sugar

## 2022-01-11 NOTE — Progress Notes (Signed)
Cardiology Office Note:    Date:  01/11/2022   ID:  [name], DOB 03/19/1934, MRN [id number]  PCP:  Donnajean Lopes, MD  [facility] Cardiologist:  Candee Furbish, MD  [facility] Electrophysiologist:  None   Referring MD: Donnajean Lopes, MD   History of Present Illness:    [name] is a 88 y.o. female here for the follow-up of hypertension, and atrial fibrillation.  On 04/27/2020 she had an episode of altered mental status, reportedly having difficulty swallowing.  MRI of the brain showed small acute infarct in the right cerebellum bilateral occipital parietal lobes and high left precentral gyrus.  Patient did not receive TPA.  MRA of the head and neck showed no large vessel occlusion. Echocardiogram with ejection fraction of 70 to 75% no regional wall motion abnormalities.  Her Coumadin was initially placed on hold and since resumed; no bleeding episodes.  On 04/22/2020 she presented to the ED after a fall without loss of consciousness landing on her left hip. She stated she leaned too far forward falling out of her wheelchair.  Admission chemistries hemoglobin 10.1 WBC 11,700 BUN 30 creatinine 1.46 urinalysis negative nitrite INR 2.3.  X-rays and imaging revealed left intertrochanteric femur fracture as well as healed distal femur fracture.  Has paroxysmal atrial fibrillation. Former TIA. On chronic anticoagulation; takes Coumadin.  Decreased Toprol in the past because of bradycardia.  Uses walker. Former patient of Dr. Wynonia Lawman.  CKD stage III with baseline 1.46. Breast cancer with bilateral mastectomy 1990s.  At her last appointment, she seemed to be doing well without any chest discomfort. Previous visit decreased Toprol-XL 25 mg. She had seen her dermatologist about lower extremity swelling, reddish discoloration. Explained to her that chronic venous insufficiency can lead to some discoloration in the lower extremities.  Today: She is accompanied by a family member.  She is hard of hearing.  Sometimes, but not frequently, she notices a pain in her chest.  She now follows with a therapist. Her heart rate is checked at those visits, which has been as low as 44 bpm. She takes an extra metoprolol if she feels her heart rate is too fast while trying to sleep.  Lately she has not been able to exercise very often due to relying on her walker.  She denies any shortness of breath, or peripheral edema. No lightheadedness, headaches, syncope, orthopnea, or PND.   Past Medical History:  Diagnosis Date   Arthritis    "fingers, hips, feet, ankles" (11/10/2012)   Asthma    Atrial fibrillation ([place])    CHRONIC COUMADIN   Breast cancer ([place]) 1990's   "cancer on one side; precancerous tissue on the other" (11/10/2012)   Chronic bronchitis ([place])    "multiple times; not in a long time" (11/10/2012   Depression    GERD (gastroesophageal reflux disease)    Hearing impaired    Hypertension    Irritable bowel    Lymphedema    HX OF - IN LEFT ARM--NO NEEDLES OR B/P'S LEFT ARM   Macular degeneration    "BEGINNINGS OF" MACULAR DEGENERATION   Osteoporosis    Pneumonia    "multiple times; not in a long time" (11/10/2012)   Recurrent UTI    Sleep apnea    "dx'd w/it; don't wear mask or anything" (11/10/2012)   Stroke ([place]) ~ 2005   HX OF TIA-NO RESIDUAL PROBLEM--PARALYSIS RT SIDE FACE /PT'S MOUTH DROOPS-AND LOSS OF HEARING RT EAR --SINCE EAR SURGERY AS A CHILD  UTI (lower urinary tract infection)    FREQUENT    Past Surgical History:  Procedure Laterality Date   BREAST BIOPSY Bilateral 1990's   Dacryocystorhinostomy  02/18/2016   INNER EAR SURGERY Right    MULTIPLE EAR SURGERIES,   INTRAMEDULLARY (IM) NAIL INTERTROCHANTERIC Left 04/23/2020   Procedure: INTRAMEDULLARY (IM) NAIL INTERTROCHANTRIC;  Surgeon: Shona Needles, MD;  Location: [facility];  Service: Orthopedics;  Laterality: Left;   JOINT REPLACEMENT     KNEE ARTHROSCOPY  04/07/2012   Procedure: ARTHROSCOPY  KNEE;  Surgeon: Gearlean Alf, MD;  Location: [facility];  Service: Orthopedics;  Laterality: Left;  WITH SYNOVECTOMY   MASTECTOMY Bilateral 1990's   MASTOIDECTOMY Right 1942   ORIF FEMUR FRACTURE Left 08/08/2017   Procedure: OPEN REDUCTION INTERNAL FIXATION (ORIF) DISTAL FEMUR FRACTURE;  Surgeon: Shona Needles, MD;  Location: [facility];  Service: Orthopedics;  Laterality: Left;   TONSILLECTOMY     TOTAL KNEE ARTHROPLASTY  09/20/2011   LEFT TOTAL KNEE ARTHROPLASTY;  Surgeon: Gearlean Alf, MD;  Location: [facility];  Service: Orthopedics;  Laterality: Left;   TOTAL KNEE ARTHROPLASTY  2006    Current Medications: Current Meds  Medication Sig   acetaminophen (TYLENOL) 325 MG tablet Take 2 tablets (650 mg total) by mouth every 6 (six) hours as needed for mild pain (or Fever >/= 101).   atorvastatin (LIPITOR) 20 MG tablet Take 1 tablet (20 mg total) by mouth daily after supper.   estradiol (ESTRACE) 0.1 MG/GM vaginal cream Place 1 Applicatorful vaginally 2 (two) times a week.   hydrALAZINE (APRESOLINE) 25 MG tablet TAKE 1 TABLET (25 MG TOTAL) BY MOUTH AT BEDTIME.   losartan (COZAAR) 25 MG tablet TAKE 3 TABLETS (75 MG TOTAL) BY MOUTH AT BEDTIME.   Multiple Vitamins-Minerals (ONE-A-DAY PROACTIVE 65+) TABS Take 1 tablet by mouth daily with breakfast.   Multiple Vitamins-Minerals (PRESERVISION AREDS) CAPS Take 1 capsule by mouth 2 (two) times daily.   pantoprazole (PROTONIX) 40 MG tablet Take 1 tablet (40 mg total) by mouth daily.   Polyethyl Glyc-Propyl Glyc PF (SYSTANE ULTRA PF) 0.4-0.3 % SOLN Place 1 drop into both eyes at bedtime.   Probiotic Product (VSL#3 PO) Take 1 capsule by mouth in the morning.   Vitamin D3 (VITAMIN D) 25 MCG tablet Take 1 tablet (1,000 Units total) by mouth daily.   warfarin (COUMADIN) 5 MG tablet Take 1 tablet (5 mg total) by mouth daily.   [DISCONTINUED] metoprolol succinate (TOPROL-XL) 25 MG 24 hr tablet Take 1.5 tablets (37.5 mg total) by mouth daily.      Allergies:   Pradaxa [dabigatran etexilate mesylate], Clindamycin/lincomycin, Latex, Ampicillin, and Sulfamethoxazole   Social History   Socioeconomic History   Marital status: Married    Spouse name: Not on file   Number of children: Not on file   Years of education: Not on file   Highest education level: Not on file  Occupational History   Not on file  Tobacco Use   Smoking status: Never   Smokeless tobacco: Never  Vaping Use   Vaping Use: Never used  Substance and Sexual Activity   Alcohol use: No   Drug use: No   Sexual activity: Not on file  Other Topics Concern   Not on file  Social History Narrative   Not on file   Social Determinants of Health   Financial Resource Strain: Not on file  Food Insecurity: Not on file  Transportation Needs: Not on file  Physical  Activity: Not on file  Stress: Not on file  Social Connections: Not on file     Family History: The patient's family history includes CAD in her father and mother; Stroke in her mother.  ROS:   Please see the history of present illness.    (+) Infrequent chest pain All other systems reviewed and are negative.  EKGs/Labs/Other Studies Reviewed:    The following studies were reviewed today:  Echo  04/27/2020: Sonographer Comments: Exam terminated per patient's request.   IMPRESSIONS   1. Left ventricular ejection fraction, by estimation, is 70 to 75%. The  left ventricle has hyperdynamic function. The left ventricle has no  regional wall motion abnormalities. There is mild concentric left  ventricular hypertrophy. Left ventricular  diastolic function could not be evaluated.   2. Right ventricular systolic function is normal. The right ventricular  size is normal. There is normal pulmonary artery systolic pressure.   3. Left atrial size was moderately dilated.   4. The mitral valve is normal in structure. No evidence of mitral valve  regurgitation. No evidence of mitral stenosis. Moderate mitral  annular  calcification.   5. The aortic valve is tricuspid. Aortic valve regurgitation is mild.  Mild aortic valve sclerosis is present, with no evidence of aortic valve  stenosis.   6. The inferior vena cava is normal in size with greater than 50%  respiratory variability, suggesting right atrial pressure of 3 mmHg.   Conclusion(s)/Recommendation(s): Incomplete exam. Further imaging declined  by the patient. Unable to evaluate for atrial shunting or assess diastolic  function.   MRA Neck  04/27/2020: FINDINGS: Suboptimal study due to extensive venous contamination. During contrast infusion, the MRI equipment did not automatically begin scanning during the arterial phase. Scanning was started manually during the venous phase. In addition, there is motion on the study degrading image quality.   Carotid artery is patent bilaterally. Both vertebral arteries are patent.   IMPRESSION: Limited study due to venous phase scanning and patient motion. No large vessel occlusion in the neck.  MRA Head  04/27/2020: FINDINGS: Suboptimal image quality due to patient motion. In addition, there is venous contrast present. I discussed with the technologist from the study who insisted no contrast was injected prior to the MRA of the head   Left vertebral artery dominant and supplies the basilar. Small right vertebral artery patent to the basilar. Basilar is irregular but patent. Posterior cerebral arteries are patent bilaterally with mild atherosclerotic irregularity.   Internal carotid artery patent through the cavernous segment. There is considerable cavernous sinus contrast obscuring the cavernous carotid. Anterior and middle cerebral arteries patent bilaterally. Moderate stenosis in the anterior and middle cerebral arteries bilaterally. No large vessel occlusion or aneurysm.   IMPRESSION: Suboptimal study due to venous contrast and patient motion   Moderate intracranial atherosclerotic  disease. No large vessel occlusion.    Prior Holter monitor 2014, echocardiogram 2012-normal EF  EKG:  EKG is personally reviewed. 01/11/2022:  Sinus bradycardia. Rate 56 bpm. RBBB. First Degree AV Block. 08/30/2019:  sinus rhythm 59 no other abnormalities-prior normal sinus rhythm, nonspecific ST-T wave changes  Recent Labs: No results found for requested labs within last 365 days.   Recent Lipid Panel    Component Value Date/Time   CHOL 100 04/28/2020 0220   TRIG 129 04/28/2020 0220   HDL 36 (L) 04/28/2020 0220   CHOLHDL 2.8 04/28/2020 0220   VLDL 26 04/28/2020 0220   LDLCALC 38 04/28/2020 0220    Physical  Exam:    VS:  BP 130/70 (BP Location: Left Arm, Patient Position: Sitting, Cuff Size: Normal)   Pulse (!) 57   Ht '5\' 4"'$  (1.626 m)   Wt 165 lb (74.8 kg)   BMI 28.32 kg/m     Wt Readings from Last 3 Encounters:  01/11/22 165 lb (74.8 kg)  04/20/21 163 lb (73.9 kg)  04/03/21 163 lb (73.9 kg)     GEN:  Well nourished, well developed in no acute distress, Walker HEENT: Normal NECK: No JVD; No carotid bruits LYMPHATICS: No lymphadenopathy CARDIAC: Bradycardic, no murmurs, rubs, gallops RESPIRATORY:  Clear to auscultation without rales, wheezing or rhonchi  ABDOMEN: Soft, non-tender, non-distended MUSCULOSKELETAL:  2+ LE edema; No deformity  SKIN: Warm and dry NEUROLOGIC:  Alert and oriented x 3 PSYCHIATRIC:  Normal affect   ASSESSMENT:    1. Paroxysmal atrial fibrillation (HCC)   2. Primary hypertension     PLAN:    In order of problems listed above:  Paroxysmal atrial fibrillation -Back in 2018, was noted to be in sinus rhythm.  Continues to do well. -Takes warfarin followed by Dr. Sharlett Iles.  In the past we had discussed the possibility of transitioning over to Eliquis however she wished to be on warfarin, likes being checked frequently.  No changes made.  No bleeding.  Bradycardia -Previous visit decreased Toprol-XL 25 mg.  At a prior nurse visit, her  heart rate was 48 bpm.  She does get checked by therapist home health.  Heart rate can be slow at times.  Lets go ahead and discontinue the metoprolol and utilize it only as needed.  25 mg metoprolol XL as needed.  Chronic kidney disease stage IIIa -Baseline creatinine is about 1.5.  Last creatinine 1. 1.  With her lower extremity edema, hesitant to use Lasix given these findings.  She is on chlorthalidone 50 a day.  Continue with stockings and elevation of legs, conservative measures.  Explained to her that chronic venous insufficiency can lead to some discoloration in the lower extremities.  No changes made.  Hyperlipidemia -Continuing with statin therapy, no myalgias no side effects.  Lab work is being followed by Dr. Sharlett Iles, doing very well.   Follow-up:  1 year.  Medication Adjustments/Labs and Tests Ordered: Current medicines are reviewed at length with the patient today.  Concerns regarding medicines are outlined above.   Orders Placed This Encounter  Procedures   EKG 12-Lead   Meds ordered this encounter  Medications   metoprolol succinate (TOPROL-XL) 25 MG 24 hr tablet    Sig: Take 1 tablet (25 mg total) by mouth daily as needed.    Dispense:  30 tablet    Refill:  0   Patient Instructions  Medication Instructions:  You may take your metoprolol 25 mg once a day as needed. Continue all other medications as listed.  *If you need a refill on your cardiac medications before your next appointment, please call your pharmacy*  Follow-Up: At [facility], you and your health needs are our priority.  As part of our continuing mission to provide you with exceptional heart care, we have created designated Provider Care Teams.  These Care Teams include your primary Cardiologist (physician) and Advanced Practice Providers (APPs -  Physician Assistants and Nurse Practitioners) who all work together to provide you with the care you need, when you need it.  We recommend  signing up for the patient portal called "MyChart".  Sign up information is provided on this  After Visit Summary.  MyChart is used to connect with patients for Virtual Visits (Telemedicine).  Patients are able to view lab/test results, encounter notes, upcoming appointments, etc.  Non-urgent messages can be sent to your provider as well.   To learn more about what you can do with MyChart, go to [email or web address].    Your next appointment:   1 year(s)  The format for your next appointment:   In Person  Provider:   Candee Furbish, MD     Important Information About Sugar         I,Mathew Stumpf,acting as a scribe for Candee Furbish, MD.,have documented all relevant documentation on the behalf of Candee Furbish, MD,as directed by  Candee Furbish, MD while in the presence of Candee Furbish, MD.  I, Candee Furbish, MD, have reviewed all documentation for this visit. The documentation on 01/11/22 for the exam, diagnosis, procedures, and orders are all accurate and complete.   Signed, Candee Furbish, MD  01/11/2022 4:50 PM    [facility]

## 2022-01-28 DIAGNOSIS — Z7901 Long term (current) use of anticoagulants: Secondary | ICD-10-CM | POA: Diagnosis not present

## 2022-01-28 DIAGNOSIS — I63541 Cerebral infarction due to unspecified occlusion or stenosis of right cerebellar artery: Secondary | ICD-10-CM | POA: Diagnosis not present

## 2022-01-28 DIAGNOSIS — I480 Paroxysmal atrial fibrillation: Secondary | ICD-10-CM | POA: Diagnosis not present

## 2022-02-11 DIAGNOSIS — I6930 Unspecified sequelae of cerebral infarction: Secondary | ICD-10-CM | POA: Diagnosis not present

## 2022-02-11 DIAGNOSIS — M810 Age-related osteoporosis without current pathological fracture: Secondary | ICD-10-CM | POA: Diagnosis not present

## 2022-02-11 DIAGNOSIS — R2681 Unsteadiness on feet: Secondary | ICD-10-CM | POA: Diagnosis not present

## 2022-02-22 DIAGNOSIS — N390 Urinary tract infection, site not specified: Secondary | ICD-10-CM | POA: Diagnosis not present

## 2022-02-26 DIAGNOSIS — M542 Cervicalgia: Secondary | ICD-10-CM | POA: Diagnosis not present

## 2022-02-26 DIAGNOSIS — D631 Anemia in chronic kidney disease: Secondary | ICD-10-CM | POA: Diagnosis not present

## 2022-02-26 DIAGNOSIS — I480 Paroxysmal atrial fibrillation: Secondary | ICD-10-CM | POA: Diagnosis not present

## 2022-02-26 DIAGNOSIS — M150 Primary generalized (osteo)arthritis: Secondary | ICD-10-CM | POA: Diagnosis not present

## 2022-02-26 DIAGNOSIS — Z7901 Long term (current) use of anticoagulants: Secondary | ICD-10-CM | POA: Diagnosis not present

## 2022-02-26 DIAGNOSIS — N1831 Chronic kidney disease, stage 3a: Secondary | ICD-10-CM | POA: Diagnosis not present

## 2022-02-26 DIAGNOSIS — E785 Hyperlipidemia, unspecified: Secondary | ICD-10-CM | POA: Diagnosis not present

## 2022-02-26 DIAGNOSIS — I872 Venous insufficiency (chronic) (peripheral): Secondary | ICD-10-CM | POA: Diagnosis not present

## 2022-02-26 DIAGNOSIS — I129 Hypertensive chronic kidney disease with stage 1 through stage 4 chronic kidney disease, or unspecified chronic kidney disease: Secondary | ICD-10-CM | POA: Diagnosis not present

## 2022-02-26 DIAGNOSIS — I351 Nonrheumatic aortic (valve) insufficiency: Secondary | ICD-10-CM | POA: Diagnosis not present

## 2022-02-26 DIAGNOSIS — D6859 Other primary thrombophilia: Secondary | ICD-10-CM | POA: Diagnosis not present

## 2022-02-26 DIAGNOSIS — M545 Low back pain, unspecified: Secondary | ICD-10-CM | POA: Diagnosis not present

## 2022-02-26 DIAGNOSIS — H9192 Unspecified hearing loss, left ear: Secondary | ICD-10-CM | POA: Diagnosis not present

## 2022-02-26 DIAGNOSIS — K219 Gastro-esophageal reflux disease without esophagitis: Secondary | ICD-10-CM | POA: Diagnosis not present

## 2022-02-26 DIAGNOSIS — G4733 Obstructive sleep apnea (adult) (pediatric): Secondary | ICD-10-CM | POA: Diagnosis not present

## 2022-02-26 DIAGNOSIS — M7522 Bicipital tendinitis, left shoulder: Secondary | ICD-10-CM | POA: Diagnosis not present

## 2022-02-26 DIAGNOSIS — F329 Major depressive disorder, single episode, unspecified: Secondary | ICD-10-CM | POA: Diagnosis not present

## 2022-02-26 DIAGNOSIS — M8580 Other specified disorders of bone density and structure, unspecified site: Secondary | ICD-10-CM | POA: Diagnosis not present

## 2022-02-26 DIAGNOSIS — K589 Irritable bowel syndrome without diarrhea: Secondary | ICD-10-CM | POA: Diagnosis not present

## 2022-02-26 DIAGNOSIS — J42 Unspecified chronic bronchitis: Secondary | ICD-10-CM | POA: Diagnosis not present

## 2022-02-26 DIAGNOSIS — G8929 Other chronic pain: Secondary | ICD-10-CM | POA: Diagnosis not present

## 2022-02-26 DIAGNOSIS — M810 Age-related osteoporosis without current pathological fracture: Secondary | ICD-10-CM | POA: Diagnosis not present

## 2022-02-26 DIAGNOSIS — H3530 Unspecified macular degeneration: Secondary | ICD-10-CM | POA: Diagnosis not present

## 2022-02-26 DIAGNOSIS — J45909 Unspecified asthma, uncomplicated: Secondary | ICD-10-CM | POA: Diagnosis not present

## 2022-02-26 DIAGNOSIS — I890 Lymphedema, not elsewhere classified: Secondary | ICD-10-CM | POA: Diagnosis not present

## 2022-03-02 DIAGNOSIS — Z7901 Long term (current) use of anticoagulants: Secondary | ICD-10-CM | POA: Diagnosis not present

## 2022-03-02 DIAGNOSIS — Z23 Encounter for immunization: Secondary | ICD-10-CM | POA: Diagnosis not present

## 2022-03-02 DIAGNOSIS — F329 Major depressive disorder, single episode, unspecified: Secondary | ICD-10-CM | POA: Diagnosis not present

## 2022-03-02 DIAGNOSIS — I63541 Cerebral infarction due to unspecified occlusion or stenosis of right cerebellar artery: Secondary | ICD-10-CM | POA: Diagnosis not present

## 2022-03-02 DIAGNOSIS — M150 Primary generalized (osteo)arthritis: Secondary | ICD-10-CM | POA: Diagnosis not present

## 2022-03-02 DIAGNOSIS — I480 Paroxysmal atrial fibrillation: Secondary | ICD-10-CM | POA: Diagnosis not present

## 2022-03-02 DIAGNOSIS — I129 Hypertensive chronic kidney disease with stage 1 through stage 4 chronic kidney disease, or unspecified chronic kidney disease: Secondary | ICD-10-CM | POA: Diagnosis not present

## 2022-03-02 DIAGNOSIS — D631 Anemia in chronic kidney disease: Secondary | ICD-10-CM | POA: Diagnosis not present

## 2022-03-02 DIAGNOSIS — N1831 Chronic kidney disease, stage 3a: Secondary | ICD-10-CM | POA: Diagnosis not present

## 2022-03-04 DIAGNOSIS — I129 Hypertensive chronic kidney disease with stage 1 through stage 4 chronic kidney disease, or unspecified chronic kidney disease: Secondary | ICD-10-CM | POA: Diagnosis not present

## 2022-03-04 DIAGNOSIS — I480 Paroxysmal atrial fibrillation: Secondary | ICD-10-CM | POA: Diagnosis not present

## 2022-03-04 DIAGNOSIS — N1831 Chronic kidney disease, stage 3a: Secondary | ICD-10-CM | POA: Diagnosis not present

## 2022-03-04 DIAGNOSIS — D631 Anemia in chronic kidney disease: Secondary | ICD-10-CM | POA: Diagnosis not present

## 2022-03-04 DIAGNOSIS — M150 Primary generalized (osteo)arthritis: Secondary | ICD-10-CM | POA: Diagnosis not present

## 2022-03-04 DIAGNOSIS — F329 Major depressive disorder, single episode, unspecified: Secondary | ICD-10-CM | POA: Diagnosis not present

## 2022-03-08 DIAGNOSIS — I480 Paroxysmal atrial fibrillation: Secondary | ICD-10-CM | POA: Diagnosis not present

## 2022-03-08 DIAGNOSIS — F329 Major depressive disorder, single episode, unspecified: Secondary | ICD-10-CM | POA: Diagnosis not present

## 2022-03-08 DIAGNOSIS — R399 Unspecified symptoms and signs involving the genitourinary system: Secondary | ICD-10-CM | POA: Diagnosis not present

## 2022-03-08 DIAGNOSIS — M150 Primary generalized (osteo)arthritis: Secondary | ICD-10-CM | POA: Diagnosis not present

## 2022-03-08 DIAGNOSIS — I129 Hypertensive chronic kidney disease with stage 1 through stage 4 chronic kidney disease, or unspecified chronic kidney disease: Secondary | ICD-10-CM | POA: Diagnosis not present

## 2022-03-08 DIAGNOSIS — D631 Anemia in chronic kidney disease: Secondary | ICD-10-CM | POA: Diagnosis not present

## 2022-03-08 DIAGNOSIS — N1831 Chronic kidney disease, stage 3a: Secondary | ICD-10-CM | POA: Diagnosis not present

## 2022-03-10 DIAGNOSIS — I480 Paroxysmal atrial fibrillation: Secondary | ICD-10-CM | POA: Diagnosis not present

## 2022-03-10 DIAGNOSIS — N1831 Chronic kidney disease, stage 3a: Secondary | ICD-10-CM | POA: Diagnosis not present

## 2022-03-10 DIAGNOSIS — I129 Hypertensive chronic kidney disease with stage 1 through stage 4 chronic kidney disease, or unspecified chronic kidney disease: Secondary | ICD-10-CM | POA: Diagnosis not present

## 2022-03-10 DIAGNOSIS — M150 Primary generalized (osteo)arthritis: Secondary | ICD-10-CM | POA: Diagnosis not present

## 2022-03-10 DIAGNOSIS — D631 Anemia in chronic kidney disease: Secondary | ICD-10-CM | POA: Diagnosis not present

## 2022-03-10 DIAGNOSIS — F329 Major depressive disorder, single episode, unspecified: Secondary | ICD-10-CM | POA: Diagnosis not present

## 2022-03-11 DIAGNOSIS — Z87440 Personal history of urinary (tract) infections: Secondary | ICD-10-CM | POA: Diagnosis not present

## 2022-03-13 DIAGNOSIS — N390 Urinary tract infection, site not specified: Secondary | ICD-10-CM | POA: Diagnosis not present

## 2022-03-15 DIAGNOSIS — N1831 Chronic kidney disease, stage 3a: Secondary | ICD-10-CM | POA: Diagnosis not present

## 2022-03-15 DIAGNOSIS — M150 Primary generalized (osteo)arthritis: Secondary | ICD-10-CM | POA: Diagnosis not present

## 2022-03-15 DIAGNOSIS — I129 Hypertensive chronic kidney disease with stage 1 through stage 4 chronic kidney disease, or unspecified chronic kidney disease: Secondary | ICD-10-CM | POA: Diagnosis not present

## 2022-03-15 DIAGNOSIS — D631 Anemia in chronic kidney disease: Secondary | ICD-10-CM | POA: Diagnosis not present

## 2022-03-15 DIAGNOSIS — I480 Paroxysmal atrial fibrillation: Secondary | ICD-10-CM | POA: Diagnosis not present

## 2022-03-15 DIAGNOSIS — F329 Major depressive disorder, single episode, unspecified: Secondary | ICD-10-CM | POA: Diagnosis not present

## 2022-03-17 DIAGNOSIS — F329 Major depressive disorder, single episode, unspecified: Secondary | ICD-10-CM | POA: Diagnosis not present

## 2022-03-17 DIAGNOSIS — I129 Hypertensive chronic kidney disease with stage 1 through stage 4 chronic kidney disease, or unspecified chronic kidney disease: Secondary | ICD-10-CM | POA: Diagnosis not present

## 2022-03-17 DIAGNOSIS — D631 Anemia in chronic kidney disease: Secondary | ICD-10-CM | POA: Diagnosis not present

## 2022-03-17 DIAGNOSIS — M150 Primary generalized (osteo)arthritis: Secondary | ICD-10-CM | POA: Diagnosis not present

## 2022-03-17 DIAGNOSIS — I480 Paroxysmal atrial fibrillation: Secondary | ICD-10-CM | POA: Diagnosis not present

## 2022-03-17 DIAGNOSIS — N1831 Chronic kidney disease, stage 3a: Secondary | ICD-10-CM | POA: Diagnosis not present

## 2022-03-23 DIAGNOSIS — I480 Paroxysmal atrial fibrillation: Secondary | ICD-10-CM | POA: Diagnosis not present

## 2022-03-23 DIAGNOSIS — I129 Hypertensive chronic kidney disease with stage 1 through stage 4 chronic kidney disease, or unspecified chronic kidney disease: Secondary | ICD-10-CM | POA: Diagnosis not present

## 2022-03-23 DIAGNOSIS — D631 Anemia in chronic kidney disease: Secondary | ICD-10-CM | POA: Diagnosis not present

## 2022-03-23 DIAGNOSIS — M150 Primary generalized (osteo)arthritis: Secondary | ICD-10-CM | POA: Diagnosis not present

## 2022-03-23 DIAGNOSIS — N1831 Chronic kidney disease, stage 3a: Secondary | ICD-10-CM | POA: Diagnosis not present

## 2022-03-23 DIAGNOSIS — F329 Major depressive disorder, single episode, unspecified: Secondary | ICD-10-CM | POA: Diagnosis not present

## 2022-03-25 DIAGNOSIS — I129 Hypertensive chronic kidney disease with stage 1 through stage 4 chronic kidney disease, or unspecified chronic kidney disease: Secondary | ICD-10-CM | POA: Diagnosis not present

## 2022-03-25 DIAGNOSIS — M150 Primary generalized (osteo)arthritis: Secondary | ICD-10-CM | POA: Diagnosis not present

## 2022-03-25 DIAGNOSIS — F329 Major depressive disorder, single episode, unspecified: Secondary | ICD-10-CM | POA: Diagnosis not present

## 2022-03-25 DIAGNOSIS — D631 Anemia in chronic kidney disease: Secondary | ICD-10-CM | POA: Diagnosis not present

## 2022-03-25 DIAGNOSIS — N1831 Chronic kidney disease, stage 3a: Secondary | ICD-10-CM | POA: Diagnosis not present

## 2022-03-25 DIAGNOSIS — I480 Paroxysmal atrial fibrillation: Secondary | ICD-10-CM | POA: Diagnosis not present

## 2022-03-28 DIAGNOSIS — I480 Paroxysmal atrial fibrillation: Secondary | ICD-10-CM | POA: Diagnosis not present

## 2022-03-28 DIAGNOSIS — F329 Major depressive disorder, single episode, unspecified: Secondary | ICD-10-CM | POA: Diagnosis not present

## 2022-03-28 DIAGNOSIS — K589 Irritable bowel syndrome without diarrhea: Secondary | ICD-10-CM | POA: Diagnosis not present

## 2022-03-28 DIAGNOSIS — M810 Age-related osteoporosis without current pathological fracture: Secondary | ICD-10-CM | POA: Diagnosis not present

## 2022-03-28 DIAGNOSIS — G4733 Obstructive sleep apnea (adult) (pediatric): Secondary | ICD-10-CM | POA: Diagnosis not present

## 2022-03-28 DIAGNOSIS — M150 Primary generalized (osteo)arthritis: Secondary | ICD-10-CM | POA: Diagnosis not present

## 2022-03-28 DIAGNOSIS — M7522 Bicipital tendinitis, left shoulder: Secondary | ICD-10-CM | POA: Diagnosis not present

## 2022-03-28 DIAGNOSIS — J45909 Unspecified asthma, uncomplicated: Secondary | ICD-10-CM | POA: Diagnosis not present

## 2022-03-28 DIAGNOSIS — M545 Low back pain, unspecified: Secondary | ICD-10-CM | POA: Diagnosis not present

## 2022-03-28 DIAGNOSIS — M542 Cervicalgia: Secondary | ICD-10-CM | POA: Diagnosis not present

## 2022-03-28 DIAGNOSIS — I129 Hypertensive chronic kidney disease with stage 1 through stage 4 chronic kidney disease, or unspecified chronic kidney disease: Secondary | ICD-10-CM | POA: Diagnosis not present

## 2022-03-28 DIAGNOSIS — M8580 Other specified disorders of bone density and structure, unspecified site: Secondary | ICD-10-CM | POA: Diagnosis not present

## 2022-03-28 DIAGNOSIS — H9192 Unspecified hearing loss, left ear: Secondary | ICD-10-CM | POA: Diagnosis not present

## 2022-03-28 DIAGNOSIS — I872 Venous insufficiency (chronic) (peripheral): Secondary | ICD-10-CM | POA: Diagnosis not present

## 2022-03-28 DIAGNOSIS — E785 Hyperlipidemia, unspecified: Secondary | ICD-10-CM | POA: Diagnosis not present

## 2022-03-28 DIAGNOSIS — I351 Nonrheumatic aortic (valve) insufficiency: Secondary | ICD-10-CM | POA: Diagnosis not present

## 2022-03-28 DIAGNOSIS — G8929 Other chronic pain: Secondary | ICD-10-CM | POA: Diagnosis not present

## 2022-03-28 DIAGNOSIS — J42 Unspecified chronic bronchitis: Secondary | ICD-10-CM | POA: Diagnosis not present

## 2022-03-28 DIAGNOSIS — H3530 Unspecified macular degeneration: Secondary | ICD-10-CM | POA: Diagnosis not present

## 2022-03-28 DIAGNOSIS — N1831 Chronic kidney disease, stage 3a: Secondary | ICD-10-CM | POA: Diagnosis not present

## 2022-03-28 DIAGNOSIS — K219 Gastro-esophageal reflux disease without esophagitis: Secondary | ICD-10-CM | POA: Diagnosis not present

## 2022-03-28 DIAGNOSIS — D631 Anemia in chronic kidney disease: Secondary | ICD-10-CM | POA: Diagnosis not present

## 2022-03-28 DIAGNOSIS — Z7901 Long term (current) use of anticoagulants: Secondary | ICD-10-CM | POA: Diagnosis not present

## 2022-03-28 DIAGNOSIS — I890 Lymphedema, not elsewhere classified: Secondary | ICD-10-CM | POA: Diagnosis not present

## 2022-03-28 DIAGNOSIS — D6859 Other primary thrombophilia: Secondary | ICD-10-CM | POA: Diagnosis not present

## 2022-03-30 DIAGNOSIS — D631 Anemia in chronic kidney disease: Secondary | ICD-10-CM | POA: Diagnosis not present

## 2022-03-30 DIAGNOSIS — N1831 Chronic kidney disease, stage 3a: Secondary | ICD-10-CM | POA: Diagnosis not present

## 2022-03-30 DIAGNOSIS — I480 Paroxysmal atrial fibrillation: Secondary | ICD-10-CM | POA: Diagnosis not present

## 2022-03-30 DIAGNOSIS — M150 Primary generalized (osteo)arthritis: Secondary | ICD-10-CM | POA: Diagnosis not present

## 2022-03-30 DIAGNOSIS — F329 Major depressive disorder, single episode, unspecified: Secondary | ICD-10-CM | POA: Diagnosis not present

## 2022-03-30 DIAGNOSIS — I129 Hypertensive chronic kidney disease with stage 1 through stage 4 chronic kidney disease, or unspecified chronic kidney disease: Secondary | ICD-10-CM | POA: Diagnosis not present

## 2022-03-31 DIAGNOSIS — N390 Urinary tract infection, site not specified: Secondary | ICD-10-CM | POA: Diagnosis not present

## 2022-04-06 DIAGNOSIS — I480 Paroxysmal atrial fibrillation: Secondary | ICD-10-CM | POA: Diagnosis not present

## 2022-04-06 DIAGNOSIS — Z7901 Long term (current) use of anticoagulants: Secondary | ICD-10-CM | POA: Diagnosis not present

## 2022-04-06 DIAGNOSIS — I63541 Cerebral infarction due to unspecified occlusion or stenosis of right cerebellar artery: Secondary | ICD-10-CM | POA: Diagnosis not present

## 2022-04-07 DIAGNOSIS — N1831 Chronic kidney disease, stage 3a: Secondary | ICD-10-CM | POA: Diagnosis not present

## 2022-04-07 DIAGNOSIS — I129 Hypertensive chronic kidney disease with stage 1 through stage 4 chronic kidney disease, or unspecified chronic kidney disease: Secondary | ICD-10-CM | POA: Diagnosis not present

## 2022-04-07 DIAGNOSIS — M150 Primary generalized (osteo)arthritis: Secondary | ICD-10-CM | POA: Diagnosis not present

## 2022-04-07 DIAGNOSIS — D631 Anemia in chronic kidney disease: Secondary | ICD-10-CM | POA: Diagnosis not present

## 2022-04-07 DIAGNOSIS — I480 Paroxysmal atrial fibrillation: Secondary | ICD-10-CM | POA: Diagnosis not present

## 2022-04-07 DIAGNOSIS — F329 Major depressive disorder, single episode, unspecified: Secondary | ICD-10-CM | POA: Diagnosis not present

## 2022-04-08 DIAGNOSIS — F329 Major depressive disorder, single episode, unspecified: Secondary | ICD-10-CM | POA: Diagnosis not present

## 2022-04-08 DIAGNOSIS — H353120 Nonexudative age-related macular degeneration, left eye, stage unspecified: Secondary | ICD-10-CM | POA: Diagnosis not present

## 2022-04-08 DIAGNOSIS — N1831 Chronic kidney disease, stage 3a: Secondary | ICD-10-CM | POA: Diagnosis not present

## 2022-04-08 DIAGNOSIS — I480 Paroxysmal atrial fibrillation: Secondary | ICD-10-CM | POA: Diagnosis not present

## 2022-04-08 DIAGNOSIS — D631 Anemia in chronic kidney disease: Secondary | ICD-10-CM | POA: Diagnosis not present

## 2022-04-08 DIAGNOSIS — I6990 Unspecified sequelae of unspecified cerebrovascular disease: Secondary | ICD-10-CM | POA: Diagnosis not present

## 2022-04-08 DIAGNOSIS — M150 Primary generalized (osteo)arthritis: Secondary | ICD-10-CM | POA: Diagnosis not present

## 2022-04-08 DIAGNOSIS — H90A31 Mixed conductive and sensorineural hearing loss, unilateral, right ear with restricted hearing on the contralateral side: Secondary | ICD-10-CM | POA: Diagnosis not present

## 2022-04-08 DIAGNOSIS — H353110 Nonexudative age-related macular degeneration, right eye, stage unspecified: Secondary | ICD-10-CM | POA: Diagnosis not present

## 2022-04-08 DIAGNOSIS — Z9089 Acquired absence of other organs: Secondary | ICD-10-CM | POA: Diagnosis not present

## 2022-04-08 DIAGNOSIS — I129 Hypertensive chronic kidney disease with stage 1 through stage 4 chronic kidney disease, or unspecified chronic kidney disease: Secondary | ICD-10-CM | POA: Diagnosis not present

## 2022-04-08 DIAGNOSIS — N1832 Chronic kidney disease, stage 3b: Secondary | ICD-10-CM | POA: Diagnosis not present

## 2022-04-08 DIAGNOSIS — H90A22 Sensorineural hearing loss, unilateral, left ear, with restricted hearing on the contralateral side: Secondary | ICD-10-CM | POA: Diagnosis not present

## 2022-04-12 DIAGNOSIS — M150 Primary generalized (osteo)arthritis: Secondary | ICD-10-CM | POA: Diagnosis not present

## 2022-04-12 DIAGNOSIS — D631 Anemia in chronic kidney disease: Secondary | ICD-10-CM | POA: Diagnosis not present

## 2022-04-12 DIAGNOSIS — N1831 Chronic kidney disease, stage 3a: Secondary | ICD-10-CM | POA: Diagnosis not present

## 2022-04-12 DIAGNOSIS — I480 Paroxysmal atrial fibrillation: Secondary | ICD-10-CM | POA: Diagnosis not present

## 2022-04-12 DIAGNOSIS — F329 Major depressive disorder, single episode, unspecified: Secondary | ICD-10-CM | POA: Diagnosis not present

## 2022-04-12 DIAGNOSIS — I129 Hypertensive chronic kidney disease with stage 1 through stage 4 chronic kidney disease, or unspecified chronic kidney disease: Secondary | ICD-10-CM | POA: Diagnosis not present

## 2022-04-14 DIAGNOSIS — F329 Major depressive disorder, single episode, unspecified: Secondary | ICD-10-CM | POA: Diagnosis not present

## 2022-04-14 DIAGNOSIS — I129 Hypertensive chronic kidney disease with stage 1 through stage 4 chronic kidney disease, or unspecified chronic kidney disease: Secondary | ICD-10-CM | POA: Diagnosis not present

## 2022-04-14 DIAGNOSIS — M150 Primary generalized (osteo)arthritis: Secondary | ICD-10-CM | POA: Diagnosis not present

## 2022-04-14 DIAGNOSIS — D631 Anemia in chronic kidney disease: Secondary | ICD-10-CM | POA: Diagnosis not present

## 2022-04-14 DIAGNOSIS — I480 Paroxysmal atrial fibrillation: Secondary | ICD-10-CM | POA: Diagnosis not present

## 2022-04-14 DIAGNOSIS — N1831 Chronic kidney disease, stage 3a: Secondary | ICD-10-CM | POA: Diagnosis not present

## 2022-04-19 DIAGNOSIS — N390 Urinary tract infection, site not specified: Secondary | ICD-10-CM | POA: Diagnosis not present

## 2022-04-20 DIAGNOSIS — Z01818 Encounter for other preprocedural examination: Secondary | ICD-10-CM | POA: Diagnosis not present

## 2022-04-21 DIAGNOSIS — F329 Major depressive disorder, single episode, unspecified: Secondary | ICD-10-CM | POA: Diagnosis not present

## 2022-04-21 DIAGNOSIS — I129 Hypertensive chronic kidney disease with stage 1 through stage 4 chronic kidney disease, or unspecified chronic kidney disease: Secondary | ICD-10-CM | POA: Diagnosis not present

## 2022-04-21 DIAGNOSIS — I480 Paroxysmal atrial fibrillation: Secondary | ICD-10-CM | POA: Diagnosis not present

## 2022-04-21 DIAGNOSIS — D631 Anemia in chronic kidney disease: Secondary | ICD-10-CM | POA: Diagnosis not present

## 2022-04-21 DIAGNOSIS — M150 Primary generalized (osteo)arthritis: Secondary | ICD-10-CM | POA: Diagnosis not present

## 2022-04-21 DIAGNOSIS — N1831 Chronic kidney disease, stage 3a: Secondary | ICD-10-CM | POA: Diagnosis not present

## 2022-04-22 DIAGNOSIS — I480 Paroxysmal atrial fibrillation: Secondary | ICD-10-CM | POA: Diagnosis not present

## 2022-04-22 DIAGNOSIS — Z7901 Long term (current) use of anticoagulants: Secondary | ICD-10-CM | POA: Diagnosis not present

## 2022-04-22 DIAGNOSIS — I63541 Cerebral infarction due to unspecified occlusion or stenosis of right cerebellar artery: Secondary | ICD-10-CM | POA: Diagnosis not present

## 2022-05-03 DIAGNOSIS — N390 Urinary tract infection, site not specified: Secondary | ICD-10-CM | POA: Diagnosis not present

## 2022-05-05 ENCOUNTER — Telehealth: Payer: Self-pay | Admitting: Cardiology

## 2022-05-05 NOTE — Telephone Encounter (Signed)
Patient c/o Palpitations:  High priority if patient c/o lightheadedness, shortness of breath, or chest pain  How long have you had palpitations/irregular HR/ Afib? Are you having the symptoms now? 2 days ago , she started having Afib- not at this exact time  Are you currently experiencing lightheadedness, SOB or CP?   Do you have a history of afib (atrial fibrillation) or irregular heart rhythm? yes  Have you checked your BP or HR? (document readings if available):   Are you experiencing any other symptoms? Husband said he did not know, he said all patient said she was in Afib- she is still asleep at this time- husband wants patient seen asap please

## 2022-05-05 NOTE — Progress Notes (Unsigned)
Office Visit    Patient Name: Destin Kittler Date of Encounter: 05/06/2022  PCP:  Donnajean Lopes, MD   [place]  Cardiologist:  Candee Furbish, MD  Advanced Practice Provider:  No care team member to display Electrophysiologist:  None      Chief Complaint    Erabella Kuipers is a 89 y.o. female presents today for palpitations.   Past Medical History    Past Medical History:  Diagnosis Date   Arthritis    "fingers, hips, feet, ankles" (11/10/2012)   Asthma    Atrial fibrillation ([place])    CHRONIC COUMADIN   Breast cancer ([place]) 1990's   "cancer on one side; precancerous tissue on the other" (11/10/2012)   Chronic bronchitis ([place])    "multiple times; not in a long time" (11/10/2012   Depression    GERD (gastroesophageal reflux disease)    Hearing impaired    Hypertension    Irritable bowel    Lymphedema    HX OF - IN LEFT ARM--NO NEEDLES OR B/P'S LEFT ARM   Macular degeneration    "BEGINNINGS OF" MACULAR DEGENERATION   Osteoporosis    Pneumonia    "multiple times; not in a long time" (11/10/2012)   Recurrent UTI    Sleep apnea    "dx'd w/it; don't wear mask or anything" (11/10/2012)   Stroke ([place]) ~ 2005   HX OF TIA-NO RESIDUAL PROBLEM--PARALYSIS RT SIDE FACE /PT'S MOUTH DROOPS-AND LOSS OF HEARING RT EAR --SINCE EAR SURGERY AS A CHILD    UTI (lower urinary tract infection)    FREQUENT   Past Surgical History:  Procedure Laterality Date   BREAST BIOPSY Bilateral 1990's   Dacryocystorhinostomy  02/18/2016   INNER EAR SURGERY Right    MULTIPLE EAR SURGERIES,   INTRAMEDULLARY (IM) NAIL INTERTROCHANTERIC Left 04/23/2020   Procedure: INTRAMEDULLARY (IM) NAIL INTERTROCHANTRIC;  Surgeon: Shona Needles, MD;  Location: [place];  Service: Orthopedics;  Laterality: Left;   JOINT REPLACEMENT     KNEE ARTHROSCOPY  04/07/2012   Procedure: ARTHROSCOPY KNEE;  Surgeon: Gearlean Alf, MD;  Location: [facility];  Service: Orthopedics;   Laterality: Left;  WITH SYNOVECTOMY   MASTECTOMY Bilateral 1990's   MASTOIDECTOMY Right 1942   ORIF FEMUR FRACTURE Left 08/08/2017   Procedure: OPEN REDUCTION INTERNAL FIXATION (ORIF) DISTAL FEMUR FRACTURE;  Surgeon: Shona Needles, MD;  Location: [place];  Service: Orthopedics;  Laterality: Left;   TONSILLECTOMY     TOTAL KNEE ARTHROPLASTY  09/20/2011   LEFT TOTAL KNEE ARTHROPLASTY;  Surgeon: Gearlean Alf, MD;  Location: [facility];  Service: Orthopedics;  Laterality: Left;   TOTAL KNEE ARTHROPLASTY  2006    Allergies  Allergies  Allergen Reactions   Pradaxa [Dabigatran Etexilate Mesylate] Other (See Comments)    INTERNAL BLEEDING   Clindamycin/Lincomycin Other (See Comments)    PT STATES HER DOCTOR TOLD HER NOT TO TAKE CLINDAMYCIN BECAUSE SHE GOT C-DIFF AFTER TAKING AMPICILLIN- "tolerated in 2022" (per spouse)   Latex Other (See Comments)    Blisters    Ampicillin Other (See Comments)    C. Diff after taking ampicillin - "tolerated in 2022" (per husband) Pt has received cephalosporins in 2013, 2014, 2017, 2019, 2022    Sulfamethoxazole Diarrhea    History of Present Illness    Aubrianne Molyneux is a 89 y.o. female with a hx of hypertension, atrial fibrillation, breast cancer s/p bilateral mastectomy in 1990s, [place], [place], CVA last seen 01/11/22 by  Dr. Marlou Porch.  Previous patient of Dr. Wynonia Lawman. Diagnosis of atrial fibrillation in 2018. January 2022 presented with AMS and difficulty swallowing. MRI brain with small acute infarct of the right cerebellum bilateral occipital parietal lobes and high left precentral gyrus. Did not receive TPA. MRA of head/neck no large vessel occlusion. Echo LVEF 70-75%, no RWMA. Warfarin initially held them resumed.   Metoprolol previously reduced due to bradycardia. At last visit 01/21/22 with Dr. Marlou Porch it was transitioned to only PRN. She has previously declined transition from Coumadin to [facility].  Contacted the office 05/05/22 noting atrial fibrillation,  palpitations starting 2 days prior.   She presents today accompanied by her husband.  When asked what brought her in today she advises that she has been having trouble closing her right eye.  She tells me that she had some previous facial surgery from her youth that left her with intermittent facial paralysis. She smiles, synkinesis is noted, her husband says she always smiles this way. This has been bothering her for two weeks, and has been present intermittently throughout her life. She is a poor historian, mainly d/t her extreme difficulty hearing, her husband interjects answers when needed.  Denies dizziness, syncopal episode, or disequilibrium.  Advised her that per our triage RN, she was having some palpitations, she advised that she has noted them the last few weeks and has needed to take her PRN metoprolol more often than not. She is taking a break from her home health PT, but this is who first brought up the concern of bradycardia to her. PT was concerned during their visit and encouraged her to discuss with cardiology. She never noted any dizziness, presyncope or syncope. She denies chest pain, dyspnea, pnd, orthopnea, n, v, dizziness, syncope, weight gain, or early satiety. Denies hematuria, hematochezia, hemoptysis.   EKGs/Labs/Other Studies Reviewed:   The following studies were reviewed today: Echo 04/27/20   1. Left ventricular ejection fraction, by estimation, is 70 to 75%. The  left ventricle has hyperdynamic function. The left ventricle has no  regional wall motion abnormalities. There is mild concentric left  ventricular hypertrophy. Left ventricular  diastolic function could not be evaluated.   2. Right ventricular systolic function is normal. The right ventricular  size is normal. There is normal pulmonary artery systolic pressure.   3. Left atrial size was moderately dilated.   4. The mitral valve is normal in structure. No evidence of mitral valve  regurgitation. No evidence  of mitral stenosis. Moderate mitral annular  calcification.   5. The aortic valve is tricuspid. Aortic valve regurgitation is mild.  Mild aortic valve sclerosis is present, with no evidence of aortic valve  stenosis.   6. The inferior vena cava is normal in size with greater than 50%  respiratory variability, suggesting right atrial pressure of 3 mmHg.   Conclusion(s)/Recommendation(s): Incomplete exam. Further imaging declined  by the patient. Unable to evaluate for atrial shunting or assess diastolic  function.  EKG:  EKG is ordered today.  The ekg ordered today demonstrates NSR, HR 60 bpm, RBB, consistent with previous EKG tracings.  N Recent Labs: No results found for requested labs within last 365 days.  Recent Lipid Panel    Component Value Date/Time   CHOL 100 04/28/2020 0220   TRIG 129 04/28/2020 0220   HDL 36 (L) 04/28/2020 0220   CHOLHDL 2.8 04/28/2020 0220   VLDL 26 04/28/2020 0220   LDLCALC 38 04/28/2020 0220    Risk Assessment/Calculations:   CHA2DS2-VASc Score =  6   This indicates a 9.7% annual risk of stroke. The patient's score is based upon: CHF History: 0 HTN History: 1 Diabetes History: 0 Stroke History: 2 Vascular Disease History: 0 Age Score: 2 Gender Score: 1     Home Medications   Current Meds  Medication Sig   acetaminophen (TYLENOL) 325 MG tablet Take 2 tablets (650 mg total) by mouth every 6 (six) hours as needed for mild pain (or Fever >/= 101).   atorvastatin (LIPITOR) 20 MG tablet Take 1 tablet (20 mg total) by mouth daily after supper.   chlorthalidone (HYGROTON) 25 MG tablet Take 25 mg by mouth daily.   estradiol (ESTRACE) 0.1 MG/GM vaginal cream Place 1 Applicatorful vaginally 2 (two) times a week.   hydrALAZINE (APRESOLINE) 25 MG tablet TAKE 1 TABLET (25 MG TOTAL) BY MOUTH AT BEDTIME.   losartan (COZAAR) 25 MG tablet TAKE 3 TABLETS (75 MG TOTAL) BY MOUTH AT BEDTIME.   Multiple Vitamins-Minerals (ONE-A-DAY PROACTIVE 65+) TABS Take 1  tablet by mouth daily with breakfast.   Multiple Vitamins-Minerals (PRESERVISION AREDS) CAPS Take 1 capsule by mouth 2 (two) times daily.   pantoprazole (PROTONIX) 40 MG tablet Take 1 tablet (40 mg total) by mouth daily.   Polyethyl Glyc-Propyl Glyc PF (SYSTANE ULTRA PF) 0.4-0.3 % SOLN Place 1 drop into both eyes at bedtime.   Probiotic Product (VSL#3 PO) Take 1 capsule by mouth in the morning.   Vitamin D3 (VITAMIN D) 25 MCG tablet Take 1 tablet (1,000 Units total) by mouth daily.   [DISCONTINUED] metoprolol succinate (TOPROL-XL) 25 MG 24 hr tablet Take 1 tablet (25 mg total) by mouth daily as needed.     Review of Systems    All other systems reviewed and are otherwise negative except as noted above.  Physical Exam    VS:  BP 138/62   Pulse 60   Ht '5\' 4"'$  (1.626 m)   Wt 169 lb 8 oz (76.9 kg)   BMI 29.09 kg/m  , BMI Body mass index is 29.09 kg/m.  Wt Readings from Last 3 Encounters:  05/06/22 169 lb 8 oz (76.9 kg)  01/11/22 165 lb (74.8 kg)  04/20/21 163 lb (73.9 kg)     GEN: Well nourished, well developed, in no acute distress. HEENT: normal. Neck: Supple, no JVD, carotid bruits, or masses. Cardiac: RRR, no murmurs, rubs, or gallops. No clubbing, cyanosis. +trace pedal edema. Radials/PT 2+ and equal bilaterally.  Respiratory:  Respirations regular and unlabored, clear to auscultation bilaterally. GI: Soft, nontender, nondistended. MS: No deformity or atrophy. Skin: Warm and dry, no rash. Neuro:  Strength and sensation are intact x all 4 extremities. Synkinesis of smile noted.  Psych: Normal affect.  Assessment & Plan    PAF / Hypercoagulable state - RRR noted today on EKG, noticed palpitations at night when she is trying to rest, has been taking her metoprolol 25 mg most evenings. Will change to scheduled, start metoprolol succinate 12.5 mg each evening at bedtime. On coumadin, managed by her PCP office. CHA2DS2-VASc Score = 6 [CHF History: 0, HTN History: 1, Diabetes  History: 0, Stroke History: 2, Vascular Disease History: 0, Age Score: 2, Gender Score: 1].  Therefore, the patient's annual risk of stroke is 9.7 %.      Hx of CVA - states she has several procedures to "drain something" in her face as a child and has had intermittent periods of facial synkinesis since. Facial synkinesis noted, hard to decipher if this is baseline  or not, symptoms have been presents > 2 weeks, although husband states this is her baseline. No other deficits noted. No skipped doses of her coumadin. Going to INR clinic today. Encouraged her to f/u with PCP. If symptoms worsen or are accompanied by other new symptoms, go to the ED.  HLD - LDL 58 09/28/21, well controlled, managed by her PCP.   Bradycardia - asymptomatic, HR today 60 bpm, metoprolol was changed to PRN for palpitations after home health PT noted the HR to be low. Palpitations persist and are bothersome, will start metoprolol succinate 12.5 mg daily to be taken at bedtime.    CKD IIIa - GFR 35 on 09/28/21, avoid nephrotoxic agents.  Disposition - start metoprolol succinate 12.5 mg HS for bothersome palpitations. F/U with PCP for concerns r/t her Return to see Dr. Marlou Porch in 3 months.          Disposition: Follow up in 3 month(s) with Candee Furbish, MD or APP.  Signed, Venia Carbon, NP

## 2022-05-05 NOTE — Telephone Encounter (Signed)
Follow Up:     I made an appointment for patient for tomorrow(05-06-22) with Laurann Montana. Please call to evaluate patient.

## 2022-05-05 NOTE — Telephone Encounter (Signed)
Attempted to return call, no answer/unable to leave message. Will try again later.

## 2022-05-06 ENCOUNTER — Ambulatory Visit (HOSPITAL_BASED_OUTPATIENT_CLINIC_OR_DEPARTMENT_OTHER): Payer: No Typology Code available for payment source | Admitting: Cardiology

## 2022-05-06 ENCOUNTER — Encounter (HOSPITAL_BASED_OUTPATIENT_CLINIC_OR_DEPARTMENT_OTHER): Payer: Self-pay | Admitting: Cardiology

## 2022-05-06 VITALS — BP 138/62 | HR 60 | Ht 64.0 in | Wt 169.5 lb

## 2022-05-06 DIAGNOSIS — I480 Paroxysmal atrial fibrillation: Secondary | ICD-10-CM | POA: Diagnosis not present

## 2022-05-06 DIAGNOSIS — I10 Essential (primary) hypertension: Secondary | ICD-10-CM | POA: Diagnosis not present

## 2022-05-06 DIAGNOSIS — Z7901 Long term (current) use of anticoagulants: Secondary | ICD-10-CM | POA: Diagnosis not present

## 2022-05-06 DIAGNOSIS — I63541 Cerebral infarction due to unspecified occlusion or stenosis of right cerebellar artery: Secondary | ICD-10-CM | POA: Diagnosis not present

## 2022-05-06 MED ORDER — METOPROLOL SUCCINATE ER 25 MG PO TB24: 12.5000 mg | ORAL_TABLET | Freq: Every day | ORAL | 3 refills | Status: AC

## 2022-05-06 NOTE — Telephone Encounter (Signed)
Patient was seen today with Venia Carbon, PA.

## 2022-05-06 NOTE — Patient Instructions (Addendum)
Medication Instructions:  Your physician has recommended you make the following change in your medication:   Change: Metoprolol 12.'5mg'$  (half tablet) daily at night   *If you need a refill on your cardiac medications before your next appointment, please call your pharmacy*  Follow-Up: At [facility], you and your health needs are our priority.  As part of our continuing mission to provide you with exceptional heart care, we have created designated Provider Care Teams.  These Care Teams include your primary Cardiologist (physician) and Advanced Practice Providers (APPs -  Physician Assistants and Nurse Practitioners) who all work together to provide you with the care you need, when you need it.  We recommend signing up for the patient portal called "MyChart".  Sign up information is provided on this After Visit Summary.  MyChart is used to connect with patients for Virtual Visits (Telemedicine).  Patients are able to view lab/test results, encounter notes, upcoming appointments, etc.  Non-urgent messages can be sent to your provider as well.   To learn more about what you can do with MyChart, go to [email or web address].    Your next appointment:   3-4 month(s)  Provider:   Candee Furbish, MD    Other Instructions Follow up with PCP regarding Facial issues

## 2022-05-11 DIAGNOSIS — I6930 Unspecified sequelae of cerebral infarction: Secondary | ICD-10-CM | POA: Diagnosis not present

## 2022-05-11 DIAGNOSIS — Z853 Personal history of malignant neoplasm of breast: Secondary | ICD-10-CM | POA: Diagnosis not present

## 2022-05-11 DIAGNOSIS — I10 Essential (primary) hypertension: Secondary | ICD-10-CM | POA: Diagnosis not present

## 2022-05-11 DIAGNOSIS — Z7901 Long term (current) use of anticoagulants: Secondary | ICD-10-CM | POA: Diagnosis not present

## 2022-05-11 DIAGNOSIS — H02401 Unspecified ptosis of right eyelid: Secondary | ICD-10-CM | POA: Diagnosis not present

## 2022-05-11 DIAGNOSIS — R2681 Unsteadiness on feet: Secondary | ICD-10-CM | POA: Diagnosis not present

## 2022-05-11 DIAGNOSIS — G4733 Obstructive sleep apnea (adult) (pediatric): Secondary | ICD-10-CM | POA: Diagnosis not present

## 2022-05-11 DIAGNOSIS — I480 Paroxysmal atrial fibrillation: Secondary | ICD-10-CM | POA: Diagnosis not present

## 2022-05-12 DIAGNOSIS — Z9889 Other specified postprocedural states: Secondary | ICD-10-CM | POA: Diagnosis not present

## 2022-05-12 DIAGNOSIS — Z961 Presence of intraocular lens: Secondary | ICD-10-CM | POA: Diagnosis not present

## 2022-05-12 DIAGNOSIS — H02052 Trichiasis without entropian right lower eyelid: Secondary | ICD-10-CM | POA: Diagnosis not present

## 2022-05-12 DIAGNOSIS — H04551 Acquired stenosis of right nasolacrimal duct: Secondary | ICD-10-CM | POA: Diagnosis not present

## 2022-05-12 DIAGNOSIS — H02403 Unspecified ptosis of bilateral eyelids: Secondary | ICD-10-CM | POA: Diagnosis not present

## 2022-05-12 DIAGNOSIS — Z8669 Personal history of other diseases of the nervous system and sense organs: Secondary | ICD-10-CM | POA: Diagnosis not present

## 2022-05-12 DIAGNOSIS — H26491 Other secondary cataract, right eye: Secondary | ICD-10-CM | POA: Diagnosis not present

## 2022-05-12 DIAGNOSIS — H04123 Dry eye syndrome of bilateral lacrimal glands: Secondary | ICD-10-CM | POA: Diagnosis not present

## 2022-05-12 DIAGNOSIS — H02002 Unspecified entropion of right lower eyelid: Secondary | ICD-10-CM | POA: Diagnosis not present

## 2022-05-20 DIAGNOSIS — I480 Paroxysmal atrial fibrillation: Secondary | ICD-10-CM | POA: Diagnosis not present

## 2022-05-20 DIAGNOSIS — I63541 Cerebral infarction due to unspecified occlusion or stenosis of right cerebellar artery: Secondary | ICD-10-CM | POA: Diagnosis not present

## 2022-05-20 DIAGNOSIS — Z7901 Long term (current) use of anticoagulants: Secondary | ICD-10-CM | POA: Diagnosis not present

## 2022-05-31 ENCOUNTER — Ambulatory Visit (INDEPENDENT_AMBULATORY_CARE_PROVIDER_SITE_OTHER): Payer: Medicare Other | Admitting: Neurology

## 2022-05-31 ENCOUNTER — Encounter: Payer: Self-pay | Admitting: Neurology

## 2022-05-31 VITALS — BP 148/56 | HR 56 | Ht 64.0 in | Wt 169.6 lb

## 2022-05-31 DIAGNOSIS — Z8669 Personal history of other diseases of the nervous system and sense organs: Secondary | ICD-10-CM

## 2022-05-31 DIAGNOSIS — N390 Urinary tract infection, site not specified: Secondary | ICD-10-CM | POA: Diagnosis not present

## 2022-05-31 NOTE — Progress Notes (Signed)
Subjective:    Patient ID: [name] is a 89 y.o. female.  HPI    Star Age, MD, PhD [facility] [address], [address] [place], [place] [address]  Dear Dr. Philip Aspen,  I saw your patient, [name], upon your kind request in my sleep clinic today for initial consultation of her sleep disorder, in particular, evaluation of her prior diagnosis of obstructive sleep apnea.  The patient is accompanied by her husband today. Of note, the patient is very hard of hearing and requested I conduct my visit without my mask on, which I did. As you know, [name] is an 89 year old female with an underlying complex medical history of breast cancer, status post bilateral mastectomy, lymphedema, osteoporosis, arthritis with status post bilateral knee replacements, asthma, reflux disease, hypertension, atrial fibrillation, history of stroke, depression, macular degeneration, hearing loss, right facial nerve palsy since childhood, and vertigo (seen by Dr. Tomi Likens in 2018), who reports sleeping fairly well and if she has sleep apnea she does not wish to treated.  Her husband is wondering what treatment options she may have as she does not wish to use a CPAP machine.  She feels that her biggest problem currently is her loss of hearing and she has an appointment to discuss a cochlear implant coming up.  Her Epworth sleepiness score is 10 out of 24, fatigue severity score is 18 out of 63.Marland Kitchen She was previously diagnosed with obstructive sleep apnea but could not tolerate CPAP therapy.  Testing was in or around 2011, prior sleep study results are not available for my review today.  She has tried an oral appliance.  I reviewed your office note from 05/11/2022. She goes to bed around 1:30 AM and rise time is between 9 and 10 AM.  She has nocturia once in the early morning hours around 6 or 6:30 AM.  She does not drink caffeine daily.  She does not currently drink any alcohol, she is a  non-smoker.  She had a tonsillectomy as a child, she also had a mastoidectomy.  Her Past Medical History Is Significant For: Past Medical History:  Diagnosis Date   Arthritis    "fingers, hips, feet, ankles" (11/10/2012)   Asthma    Atrial fibrillation ([place])    CHRONIC COUMADIN   Breast cancer ([place]) 1990's   "cancer on one side; precancerous tissue on the other" (11/10/2012)   Chronic bronchitis ([place])    "multiple times; not in a long time" (11/10/2012   Depression    GERD (gastroesophageal reflux disease)    Hearing impaired    Hypertension    Irritable bowel    Lymphedema    HX OF - IN LEFT ARM--NO NEEDLES OR B/P'S LEFT ARM   Macular degeneration    "BEGINNINGS OF" MACULAR DEGENERATION   Osteoporosis    Pneumonia    "multiple times; not in a long time" (11/10/2012)   Recurrent UTI    Sleep apnea    "dx'd w/it; don't wear mask or anything" (11/10/2012)   Stroke ([place]) ~ 2005   HX OF TIA-NO RESIDUAL PROBLEM--PARALYSIS RT SIDE FACE /PT'S MOUTH DROOPS-AND LOSS OF HEARING RT EAR --SINCE EAR SURGERY AS A CHILD    UTI (lower urinary tract infection)    FREQUENT    Her Past Surgical History Is Significant For: Past Surgical History:  Procedure Laterality Date   BREAST BIOPSY Bilateral 1990's   Dacryocystorhinostomy  02/18/2016   INNER EAR SURGERY Right    MULTIPLE  EAR SURGERIES,   INTRAMEDULLARY (IM) NAIL INTERTROCHANTERIC Left 04/23/2020   Procedure: INTRAMEDULLARY (IM) NAIL INTERTROCHANTRIC;  Surgeon: Shona Needles, MD;  Location: [place];  Service: Orthopedics;  Laterality: Left;   JOINT REPLACEMENT     KNEE ARTHROSCOPY  04/07/2012   Procedure: ARTHROSCOPY KNEE;  Surgeon: Gearlean Alf, MD;  Location: [facility];  Service: Orthopedics;  Laterality: Left;  WITH SYNOVECTOMY   MASTECTOMY Bilateral 1990's   MASTOIDECTOMY Right 1942   ORIF FEMUR FRACTURE Left 08/08/2017   Procedure: OPEN REDUCTION INTERNAL FIXATION (ORIF) DISTAL FEMUR FRACTURE;  Surgeon: Shona Needles, MD;  Location: [place];  Service: Orthopedics;  Laterality: Left;   TONSILLECTOMY     TOTAL KNEE ARTHROPLASTY  09/20/2011   LEFT TOTAL KNEE ARTHROPLASTY;  Surgeon: Gearlean Alf, MD;  [facility];  Service: Orthopedics;  Laterality: Left;   TOTAL KNEE ARTHROPLASTY  2006    Her Family History Is Significant For: Family History  Problem Relation Age of Onset   CAD Mother    Stroke Mother    CAD Father     Her Social History Is Significant For: Social History   Socioeconomic History   Marital status: Married    Spouse name: Not on file   Number of children: Not on file   Years of education: Not on file   Highest education level: Not on file  Occupational History   Not on file  Tobacco Use   Smoking status: Never   Smokeless tobacco: Never  Vaping Use   Vaping Use: Never used  Substance and Sexual Activity   Alcohol use: No   Drug use: No   Sexual activity: Not on file  Other Topics Concern   Not on file  Social History Narrative   Caffiene very rarely.     Social Determinants of Health   Financial Resource Strain: Not on file  Food Insecurity: Not on file  Transportation Needs: Not on file  Physical Activity: Not on file  Stress: Not on file  Social Connections: Not on file    Her Allergies Are:  Allergies  Allergen Reactions   Pradaxa [Dabigatran Etexilate Mesylate] Other (See Comments)    INTERNAL BLEEDING   Clindamycin/Lincomycin Other (See Comments)    PT STATES HER DOCTOR TOLD HER NOT TO TAKE CLINDAMYCIN BECAUSE SHE GOT C-DIFF AFTER TAKING AMPICILLIN- "tolerated in 2022" (per spouse)   Latex Other (See Comments)    Blisters    Ampicillin Other (See Comments)    C. Diff after taking ampicillin - "tolerated in 2022" (per husband) Pt has received cephalosporins in 2013, 2014, 2017, 2019, 2022    Sulfamethoxazole Diarrhea  :   Her Current Medications Are:  Outpatient Encounter Medications as of 05/31/2022  Medication Sig   acetaminophen  (TYLENOL) 325 MG tablet Take 2 tablets (650 mg total) by mouth every 6 (six) hours as needed for mild pain (or Fever >/= 101).   atorvastatin (LIPITOR) 20 MG tablet Take 1 tablet (20 mg total) by mouth daily after supper.   chlorthalidone (HYGROTON) 25 MG tablet Take 25 mg by mouth daily.   estradiol (ESTRACE) 0.1 MG/GM vaginal cream Place 1 Applicatorful vaginally 2 (two) times a week.   metoprolol succinate (TOPROL-XL) 25 MG 24 hr tablet Take 0.5 tablets (12.5 mg total) by mouth daily.   Multiple Vitamins-Minerals (ONE-A-DAY PROACTIVE 65+) TABS Take 1 tablet by mouth daily with breakfast.   Multiple Vitamins-Minerals (PRESERVISION AREDS) CAPS Take 1 capsule by mouth  2 (two) times daily.   pantoprazole (PROTONIX) 40 MG tablet Take 1 tablet (40 mg total) by mouth daily.   Polyethyl Glyc-Propyl Glyc PF (SYSTANE ULTRA PF) 0.4-0.3 % SOLN Place 1 drop into both eyes at bedtime.   Probiotic Product (VSL#3 PO) Take 1 capsule by mouth in the morning.   Vitamin D3 (VITAMIN D) 25 MCG tablet Take 1 tablet (1,000 Units total) by mouth daily.   hydrALAZINE (APRESOLINE) 25 MG tablet TAKE 1 TABLET (25 MG TOTAL) BY MOUTH AT BEDTIME.   losartan (COZAAR) 25 MG tablet TAKE 3 TABLETS (75 MG TOTAL) BY MOUTH AT BEDTIME.   warfarin (COUMADIN) 5 MG tablet Take 1 tablet (5 mg total) by mouth daily.   No facility-administered encounter medications on file as of 05/31/2022.  :   Review of Systems:  Out of a complete 14 point review of systems, all are reviewed and negative with the exception of these symptoms as listed below:  Review of Systems  Neurological:        Pt here for sleep consult.  Had stroke,  Had sleep study years ago and did not want to use cpap.  ESS 8, FSS 18.  R HOH, she said will see ENT about cochlear implant.     Objective:  Neurological Exam  Physical Exam Physical Examination:   Vitals:   05/31/22 1131  BP: (!) 148/56  Pulse: (!) 56    General Examination: The patient is a very  pleasant 89 y.o. female in no acute distress. She appears well-developed and well-nourished and well groomed.   HEENT: Normocephalic, atraumatic, pupils are equal, round and reactive to light, corrective eyeglasses in place, hearing severely impaired.  Face is mildly asymmetric with right lower facial weakness noted.  Speech at times with slight dysarthria.  Airway examination reveals moderate mouth dryness, redundant soft palate, Mallampati class III, tonsils absent, tongue protrudes centrally.  No significant overbite.  No carotid bruits.  Chest: Clear to auscultation without wheezing, rhonchi or crackles noted.  Heart: S1+S2+0, regular and normal without murmurs, rubs or gallops noted.   Abdomen: Soft, non-tender and non-distended.  Extremities: There is 1+ pitting edema in the distal lower extremities bilaterally.   Skin: Warm and dry without trophic changes noted.   Musculoskeletal: exam reveals no obvious joint deformities.   Neurologically:  Mental status: The patient is awake, alert and oriented in all 4 spheres. Her immediate and remote memory, attention, language skills and fund of knowledge are appropriate. There is no evidence of aphasia, agnosia, apraxia or anomia. Speech is clear with normal prosody and enunciation. Thought process is linear. Mood is normal and affect is normal.  Cranial nerves II - XII are as described above under HEENT exam.  Motor exam: Normal bulk, strength and tone is noted. There is no obvious action or resting tremor.  Fine motor skills and coordination: grossly intact.  Cerebellar testing: No dysmetria or intention tremor. There is no truncal or gait ataxia.  Sensory exam: intact to light touch in the upper and lower extremities.  Gait, station and balance: She stands with difficulty and walks with a 2 wheeled walker.    Assessment and Plan:  In summary, [name] is a very pleasant 89 y.o.-year old female with an underlying complex medical history  of breast cancer, status post bilateral mastectomy, lymphedema, osteoporosis, arthritis with status post bilateral knee replacements, asthma, reflux disease, hypertension, atrial fibrillation, history of stroke, depression, macular degeneration, hearing loss, right facial nerve palsy since childhood,  and vertigo (seen by Dr. Tomi Likens in 2018), who presents for evaluation of her obstructive sleep apnea.  She was reportedly diagnosed with mild obstructive sleep apnea several years ago.  She does not currently wish to be reevaluated.  We talked about sleep apnea, its prognosis and treatment options, we talked about different surgical and nonsurgical treatment options.  She may not be a good candidate for an oral appliance as she has no overbite, and facial asymmetry.  She agrees that she would not like to sleep with something in her mouth.  She does not wish to get evaluated for sleep apnea and declines even testing at home.  If she changes her mind, I would be happy to see her again.  She is worried about her hearing loss but has an appointment coming up for evaluation for cochlear implants.  At this juncture, she is advised to follow-up with you as scheduled.  I answered all their questions today and the patient and her husband were in agreement. Thank you very much for allowing me to participate in the care of this nice patient. If I can be of any further assistance to you please do not hesitate to call me at [phone].  Sincerely,   Star Age, MD, PhD

## 2022-05-31 NOTE — Patient Instructions (Addendum)
It was nice to meet you both today!   Here is what we discussed today: You may still have sleep apnea, as I understand you were diagnosed several years ago.  You declined sleep testing at this point.  If you change your mind, I would be happy to see you again.  Please remember, the long-term risks and ramifications of untreated moderate to severe obstructive sleep apnea may include (but are not limited to): increased risk for cardiovascular disease, including congestive heart failure, stroke, difficult to control hypertension, treatment resistant obesity, arrhythmias, especially irregular heartbeat commonly known as A. Fib. (atrial fibrillation); even type 2 diabetes has been linked to untreated OSA.   I wish you good luck with your appointment for a cochlear implant.  You can follow-up with your primary care as scheduled.

## 2022-06-01 DIAGNOSIS — Z0110 Encounter for examination of ears and hearing without abnormal findings: Secondary | ICD-10-CM | POA: Diagnosis not present

## 2022-06-01 DIAGNOSIS — H9071 Mixed conductive and sensorineural hearing loss, unilateral, right ear, with unrestricted hearing on the contralateral side: Secondary | ICD-10-CM | POA: Diagnosis not present

## 2022-06-01 DIAGNOSIS — H9042 Sensorineural hearing loss, unilateral, left ear, with unrestricted hearing on the contralateral side: Secondary | ICD-10-CM | POA: Diagnosis not present

## 2022-06-01 DIAGNOSIS — H9193 Unspecified hearing loss, bilateral: Secondary | ICD-10-CM | POA: Diagnosis not present

## 2022-06-10 DIAGNOSIS — Z8669 Personal history of other diseases of the nervous system and sense organs: Secondary | ICD-10-CM | POA: Diagnosis not present

## 2022-06-10 DIAGNOSIS — H353120 Nonexudative age-related macular degeneration, left eye, stage unspecified: Secondary | ICD-10-CM | POA: Diagnosis not present

## 2022-06-10 DIAGNOSIS — Q165 Congenital malformation of inner ear: Secondary | ICD-10-CM | POA: Diagnosis not present

## 2022-06-10 DIAGNOSIS — H90A22 Sensorineural hearing loss, unilateral, left ear, with restricted hearing on the contralateral side: Secondary | ICD-10-CM | POA: Diagnosis not present

## 2022-06-10 DIAGNOSIS — H353130 Nonexudative age-related macular degeneration, bilateral, stage unspecified: Secondary | ICD-10-CM | POA: Diagnosis not present

## 2022-06-10 DIAGNOSIS — Z9089 Acquired absence of other organs: Secondary | ICD-10-CM | POA: Diagnosis not present

## 2022-06-14 DIAGNOSIS — R339 Retention of urine, unspecified: Secondary | ICD-10-CM | POA: Diagnosis not present

## 2022-06-15 DIAGNOSIS — H3322 Serous retinal detachment, left eye: Secondary | ICD-10-CM | POA: Diagnosis not present

## 2022-06-15 DIAGNOSIS — I63541 Cerebral infarction due to unspecified occlusion or stenosis of right cerebellar artery: Secondary | ICD-10-CM | POA: Diagnosis not present

## 2022-06-15 DIAGNOSIS — Z7901 Long term (current) use of anticoagulants: Secondary | ICD-10-CM | POA: Diagnosis not present

## 2022-06-15 DIAGNOSIS — I480 Paroxysmal atrial fibrillation: Secondary | ICD-10-CM | POA: Diagnosis not present

## 2022-06-15 DIAGNOSIS — H353132 Nonexudative age-related macular degeneration, bilateral, intermediate dry stage: Secondary | ICD-10-CM | POA: Diagnosis not present

## 2022-06-15 DIAGNOSIS — H04129 Dry eye syndrome of unspecified lacrimal gland: Secondary | ICD-10-CM | POA: Diagnosis not present

## 2022-06-15 DIAGNOSIS — Z961 Presence of intraocular lens: Secondary | ICD-10-CM | POA: Diagnosis not present

## 2022-06-15 DIAGNOSIS — Z23 Encounter for immunization: Secondary | ICD-10-CM | POA: Diagnosis not present

## 2022-06-22 DIAGNOSIS — Z7901 Long term (current) use of anticoagulants: Secondary | ICD-10-CM | POA: Diagnosis not present

## 2022-06-22 DIAGNOSIS — I480 Paroxysmal atrial fibrillation: Secondary | ICD-10-CM | POA: Diagnosis not present

## 2022-06-22 DIAGNOSIS — I63541 Cerebral infarction due to unspecified occlusion or stenosis of right cerebellar artery: Secondary | ICD-10-CM | POA: Diagnosis not present

## 2022-07-06 DIAGNOSIS — I63541 Cerebral infarction due to unspecified occlusion or stenosis of right cerebellar artery: Secondary | ICD-10-CM | POA: Diagnosis not present

## 2022-07-06 DIAGNOSIS — Z7901 Long term (current) use of anticoagulants: Secondary | ICD-10-CM | POA: Diagnosis not present

## 2022-07-06 DIAGNOSIS — I480 Paroxysmal atrial fibrillation: Secondary | ICD-10-CM | POA: Diagnosis not present

## 2022-07-07 DIAGNOSIS — R339 Retention of urine, unspecified: Secondary | ICD-10-CM | POA: Diagnosis not present

## 2022-07-26 DIAGNOSIS — Z7901 Long term (current) use of anticoagulants: Secondary | ICD-10-CM | POA: Diagnosis not present

## 2022-07-26 DIAGNOSIS — H9192 Unspecified hearing loss, left ear: Secondary | ICD-10-CM | POA: Diagnosis not present

## 2022-07-26 DIAGNOSIS — R399 Unspecified symptoms and signs involving the genitourinary system: Secondary | ICD-10-CM | POA: Diagnosis not present

## 2022-07-26 DIAGNOSIS — I4891 Unspecified atrial fibrillation: Secondary | ICD-10-CM | POA: Diagnosis not present

## 2022-07-26 DIAGNOSIS — Z01818 Encounter for other preprocedural examination: Secondary | ICD-10-CM | POA: Diagnosis not present

## 2022-07-27 DIAGNOSIS — N1832 Chronic kidney disease, stage 3b: Secondary | ICD-10-CM | POA: Diagnosis not present

## 2022-07-27 DIAGNOSIS — D638 Anemia in other chronic diseases classified elsewhere: Secondary | ICD-10-CM | POA: Diagnosis not present

## 2022-07-27 DIAGNOSIS — I480 Paroxysmal atrial fibrillation: Secondary | ICD-10-CM | POA: Diagnosis not present

## 2022-08-04 DIAGNOSIS — H903 Sensorineural hearing loss, bilateral: Secondary | ICD-10-CM | POA: Diagnosis not present

## 2022-08-04 DIAGNOSIS — Z45321 Encounter for adjustment and management of cochlear device: Secondary | ICD-10-CM | POA: Diagnosis not present

## 2022-08-04 DIAGNOSIS — N1832 Chronic kidney disease, stage 3b: Secondary | ICD-10-CM | POA: Diagnosis not present

## 2022-08-04 DIAGNOSIS — Z8673 Personal history of transient ischemic attack (TIA), and cerebral infarction without residual deficits: Secondary | ICD-10-CM | POA: Diagnosis not present

## 2022-08-04 DIAGNOSIS — I358 Other nonrheumatic aortic valve disorders: Secondary | ICD-10-CM | POA: Diagnosis not present

## 2022-08-04 DIAGNOSIS — I4891 Unspecified atrial fibrillation: Secondary | ICD-10-CM | POA: Diagnosis not present

## 2022-08-04 DIAGNOSIS — G4733 Obstructive sleep apnea (adult) (pediatric): Secondary | ICD-10-CM | POA: Diagnosis not present

## 2022-08-04 DIAGNOSIS — Z7901 Long term (current) use of anticoagulants: Secondary | ICD-10-CM | POA: Diagnosis not present

## 2022-08-04 DIAGNOSIS — R339 Retention of urine, unspecified: Secondary | ICD-10-CM | POA: Diagnosis not present

## 2022-08-04 DIAGNOSIS — Z79899 Other long term (current) drug therapy: Secondary | ICD-10-CM | POA: Diagnosis not present

## 2022-08-04 DIAGNOSIS — R399 Unspecified symptoms and signs involving the genitourinary system: Secondary | ICD-10-CM | POA: Diagnosis not present

## 2022-08-05 DIAGNOSIS — N1832 Chronic kidney disease, stage 3b: Secondary | ICD-10-CM | POA: Diagnosis not present

## 2022-08-05 DIAGNOSIS — I358 Other nonrheumatic aortic valve disorders: Secondary | ICD-10-CM | POA: Diagnosis not present

## 2022-08-05 DIAGNOSIS — G4733 Obstructive sleep apnea (adult) (pediatric): Secondary | ICD-10-CM | POA: Diagnosis not present

## 2022-08-05 DIAGNOSIS — H903 Sensorineural hearing loss, bilateral: Secondary | ICD-10-CM | POA: Diagnosis not present

## 2022-08-05 DIAGNOSIS — I4891 Unspecified atrial fibrillation: Secondary | ICD-10-CM | POA: Diagnosis not present

## 2022-08-05 DIAGNOSIS — Z8673 Personal history of transient ischemic attack (TIA), and cerebral infarction without residual deficits: Secondary | ICD-10-CM | POA: Diagnosis not present

## 2022-08-06 DIAGNOSIS — Z7901 Long term (current) use of anticoagulants: Secondary | ICD-10-CM | POA: Diagnosis not present

## 2022-08-06 DIAGNOSIS — I63541 Cerebral infarction due to unspecified occlusion or stenosis of right cerebellar artery: Secondary | ICD-10-CM | POA: Diagnosis not present

## 2022-08-06 DIAGNOSIS — I480 Paroxysmal atrial fibrillation: Secondary | ICD-10-CM | POA: Diagnosis not present

## 2022-08-11 ENCOUNTER — Ambulatory Visit: Payer: No Typology Code available for payment source | Admitting: Cardiology

## 2022-08-12 DIAGNOSIS — D6859 Other primary thrombophilia: Secondary | ICD-10-CM | POA: Diagnosis not present

## 2022-08-12 DIAGNOSIS — D649 Anemia, unspecified: Secondary | ICD-10-CM | POA: Diagnosis not present

## 2022-08-12 DIAGNOSIS — K219 Gastro-esophageal reflux disease without esophagitis: Secondary | ICD-10-CM | POA: Diagnosis not present

## 2022-08-12 DIAGNOSIS — M8580 Other specified disorders of bone density and structure, unspecified site: Secondary | ICD-10-CM | POA: Diagnosis not present

## 2022-08-12 DIAGNOSIS — Z9621 Cochlear implant status: Secondary | ICD-10-CM | POA: Diagnosis not present

## 2022-08-12 DIAGNOSIS — K5903 Drug induced constipation: Secondary | ICD-10-CM | POA: Diagnosis not present

## 2022-08-12 DIAGNOSIS — I480 Paroxysmal atrial fibrillation: Secondary | ICD-10-CM | POA: Diagnosis not present

## 2022-08-12 DIAGNOSIS — H1130 Conjunctival hemorrhage, unspecified eye: Secondary | ICD-10-CM | POA: Diagnosis not present

## 2022-08-12 DIAGNOSIS — N3281 Overactive bladder: Secondary | ICD-10-CM | POA: Diagnosis not present

## 2022-08-12 DIAGNOSIS — E876 Hypokalemia: Secondary | ICD-10-CM | POA: Diagnosis not present

## 2022-08-12 DIAGNOSIS — I872 Venous insufficiency (chronic) (peripheral): Secondary | ICD-10-CM | POA: Diagnosis not present

## 2022-08-12 DIAGNOSIS — N1832 Chronic kidney disease, stage 3b: Secondary | ICD-10-CM | POA: Diagnosis not present

## 2022-08-12 DIAGNOSIS — H90A22 Sensorineural hearing loss, unilateral, left ear, with restricted hearing on the contralateral side: Secondary | ICD-10-CM | POA: Diagnosis not present

## 2022-08-12 DIAGNOSIS — J310 Chronic rhinitis: Secondary | ICD-10-CM | POA: Diagnosis not present

## 2022-08-12 DIAGNOSIS — M810 Age-related osteoporosis without current pathological fracture: Secondary | ICD-10-CM | POA: Diagnosis not present

## 2022-08-12 DIAGNOSIS — M353 Polymyalgia rheumatica: Secondary | ICD-10-CM | POA: Diagnosis not present

## 2022-08-12 DIAGNOSIS — G4733 Obstructive sleep apnea (adult) (pediatric): Secondary | ICD-10-CM | POA: Diagnosis not present

## 2022-08-12 DIAGNOSIS — I129 Hypertensive chronic kidney disease with stage 1 through stage 4 chronic kidney disease, or unspecified chronic kidney disease: Secondary | ICD-10-CM | POA: Diagnosis not present

## 2022-08-12 DIAGNOSIS — E785 Hyperlipidemia, unspecified: Secondary | ICD-10-CM | POA: Diagnosis not present

## 2022-08-12 DIAGNOSIS — H903 Sensorineural hearing loss, bilateral: Secondary | ICD-10-CM | POA: Diagnosis not present

## 2022-08-12 DIAGNOSIS — E871 Hypo-osmolality and hyponatremia: Secondary | ICD-10-CM | POA: Diagnosis not present

## 2022-08-12 DIAGNOSIS — K589 Irritable bowel syndrome without diarrhea: Secondary | ICD-10-CM | POA: Diagnosis not present

## 2022-08-12 DIAGNOSIS — G8929 Other chronic pain: Secondary | ICD-10-CM | POA: Diagnosis not present

## 2022-08-12 DIAGNOSIS — F329 Major depressive disorder, single episode, unspecified: Secondary | ICD-10-CM | POA: Diagnosis not present

## 2022-08-12 DIAGNOSIS — M545 Low back pain, unspecified: Secondary | ICD-10-CM | POA: Diagnosis not present

## 2022-08-12 DIAGNOSIS — Z4889 Encounter for other specified surgical aftercare: Secondary | ICD-10-CM | POA: Diagnosis not present

## 2022-08-12 DIAGNOSIS — R1310 Dysphagia, unspecified: Secondary | ICD-10-CM | POA: Diagnosis not present

## 2022-08-12 DIAGNOSIS — H8110 Benign paroxysmal vertigo, unspecified ear: Secondary | ICD-10-CM | POA: Diagnosis not present

## 2022-08-13 DIAGNOSIS — R2689 Other abnormalities of gait and mobility: Secondary | ICD-10-CM | POA: Diagnosis not present

## 2022-08-13 DIAGNOSIS — Z7901 Long term (current) use of anticoagulants: Secondary | ICD-10-CM | POA: Diagnosis not present

## 2022-08-13 DIAGNOSIS — I480 Paroxysmal atrial fibrillation: Secondary | ICD-10-CM | POA: Diagnosis not present

## 2022-08-13 DIAGNOSIS — I63541 Cerebral infarction due to unspecified occlusion or stenosis of right cerebellar artery: Secondary | ICD-10-CM | POA: Diagnosis not present

## 2022-08-13 DIAGNOSIS — R29898 Other symptoms and signs involving the musculoskeletal system: Secondary | ICD-10-CM | POA: Diagnosis not present

## 2022-08-16 DIAGNOSIS — D649 Anemia, unspecified: Secondary | ICD-10-CM | POA: Diagnosis not present

## 2022-08-16 DIAGNOSIS — I129 Hypertensive chronic kidney disease with stage 1 through stage 4 chronic kidney disease, or unspecified chronic kidney disease: Secondary | ICD-10-CM | POA: Diagnosis not present

## 2022-08-16 DIAGNOSIS — H903 Sensorineural hearing loss, bilateral: Secondary | ICD-10-CM | POA: Diagnosis not present

## 2022-08-16 DIAGNOSIS — N1832 Chronic kidney disease, stage 3b: Secondary | ICD-10-CM | POA: Diagnosis not present

## 2022-08-16 DIAGNOSIS — E785 Hyperlipidemia, unspecified: Secondary | ICD-10-CM | POA: Diagnosis not present

## 2022-08-16 DIAGNOSIS — I480 Paroxysmal atrial fibrillation: Secondary | ICD-10-CM | POA: Diagnosis not present

## 2022-08-17 DIAGNOSIS — R399 Unspecified symptoms and signs involving the genitourinary system: Secondary | ICD-10-CM | POA: Diagnosis not present

## 2022-08-18 DIAGNOSIS — I129 Hypertensive chronic kidney disease with stage 1 through stage 4 chronic kidney disease, or unspecified chronic kidney disease: Secondary | ICD-10-CM | POA: Diagnosis not present

## 2022-08-18 DIAGNOSIS — N1832 Chronic kidney disease, stage 3b: Secondary | ICD-10-CM | POA: Diagnosis not present

## 2022-08-18 DIAGNOSIS — D649 Anemia, unspecified: Secondary | ICD-10-CM | POA: Diagnosis not present

## 2022-08-18 DIAGNOSIS — E785 Hyperlipidemia, unspecified: Secondary | ICD-10-CM | POA: Diagnosis not present

## 2022-08-18 DIAGNOSIS — I480 Paroxysmal atrial fibrillation: Secondary | ICD-10-CM | POA: Diagnosis not present

## 2022-08-18 DIAGNOSIS — H903 Sensorineural hearing loss, bilateral: Secondary | ICD-10-CM | POA: Diagnosis not present

## 2022-08-25 DIAGNOSIS — I129 Hypertensive chronic kidney disease with stage 1 through stage 4 chronic kidney disease, or unspecified chronic kidney disease: Secondary | ICD-10-CM | POA: Diagnosis not present

## 2022-08-25 DIAGNOSIS — E785 Hyperlipidemia, unspecified: Secondary | ICD-10-CM | POA: Diagnosis not present

## 2022-08-25 DIAGNOSIS — H903 Sensorineural hearing loss, bilateral: Secondary | ICD-10-CM | POA: Diagnosis not present

## 2022-08-25 DIAGNOSIS — D649 Anemia, unspecified: Secondary | ICD-10-CM | POA: Diagnosis not present

## 2022-08-25 DIAGNOSIS — N1832 Chronic kidney disease, stage 3b: Secondary | ICD-10-CM | POA: Diagnosis not present

## 2022-08-25 DIAGNOSIS — I480 Paroxysmal atrial fibrillation: Secondary | ICD-10-CM | POA: Diagnosis not present

## 2022-08-26 DIAGNOSIS — N390 Urinary tract infection, site not specified: Secondary | ICD-10-CM | POA: Diagnosis not present

## 2022-08-27 DIAGNOSIS — D649 Anemia, unspecified: Secondary | ICD-10-CM | POA: Diagnosis not present

## 2022-08-27 DIAGNOSIS — E785 Hyperlipidemia, unspecified: Secondary | ICD-10-CM | POA: Diagnosis not present

## 2022-08-27 DIAGNOSIS — N1832 Chronic kidney disease, stage 3b: Secondary | ICD-10-CM | POA: Diagnosis not present

## 2022-08-27 DIAGNOSIS — H903 Sensorineural hearing loss, bilateral: Secondary | ICD-10-CM | POA: Diagnosis not present

## 2022-08-27 DIAGNOSIS — I480 Paroxysmal atrial fibrillation: Secondary | ICD-10-CM | POA: Diagnosis not present

## 2022-08-27 DIAGNOSIS — I129 Hypertensive chronic kidney disease with stage 1 through stage 4 chronic kidney disease, or unspecified chronic kidney disease: Secondary | ICD-10-CM | POA: Diagnosis not present

## 2022-08-30 DIAGNOSIS — I129 Hypertensive chronic kidney disease with stage 1 through stage 4 chronic kidney disease, or unspecified chronic kidney disease: Secondary | ICD-10-CM | POA: Diagnosis not present

## 2022-08-30 DIAGNOSIS — E785 Hyperlipidemia, unspecified: Secondary | ICD-10-CM | POA: Diagnosis not present

## 2022-08-30 DIAGNOSIS — N1832 Chronic kidney disease, stage 3b: Secondary | ICD-10-CM | POA: Diagnosis not present

## 2022-08-30 DIAGNOSIS — H903 Sensorineural hearing loss, bilateral: Secondary | ICD-10-CM | POA: Diagnosis not present

## 2022-08-30 DIAGNOSIS — D649 Anemia, unspecified: Secondary | ICD-10-CM | POA: Diagnosis not present

## 2022-08-30 DIAGNOSIS — I480 Paroxysmal atrial fibrillation: Secondary | ICD-10-CM | POA: Diagnosis not present

## 2022-08-31 DIAGNOSIS — N390 Urinary tract infection, site not specified: Secondary | ICD-10-CM | POA: Diagnosis not present

## 2022-09-01 DIAGNOSIS — Z45321 Encounter for adjustment and management of cochlear device: Secondary | ICD-10-CM | POA: Diagnosis not present

## 2022-09-02 DIAGNOSIS — Z9089 Acquired absence of other organs: Secondary | ICD-10-CM | POA: Diagnosis not present

## 2022-09-02 DIAGNOSIS — H903 Sensorineural hearing loss, bilateral: Secondary | ICD-10-CM | POA: Diagnosis not present

## 2022-09-02 DIAGNOSIS — H90A31 Mixed conductive and sensorineural hearing loss, unilateral, right ear with restricted hearing on the contralateral side: Secondary | ICD-10-CM | POA: Diagnosis not present

## 2022-09-02 DIAGNOSIS — Q165 Congenital malformation of inner ear: Secondary | ICD-10-CM | POA: Diagnosis not present

## 2022-09-02 DIAGNOSIS — Z9621 Cochlear implant status: Secondary | ICD-10-CM | POA: Diagnosis not present

## 2022-09-02 DIAGNOSIS — Z4889 Encounter for other specified surgical aftercare: Secondary | ICD-10-CM | POA: Diagnosis not present

## 2022-09-02 DIAGNOSIS — H90A22 Sensorineural hearing loss, unilateral, left ear, with restricted hearing on the contralateral side: Secondary | ICD-10-CM | POA: Diagnosis not present

## 2022-09-03 DIAGNOSIS — D649 Anemia, unspecified: Secondary | ICD-10-CM | POA: Diagnosis not present

## 2022-09-03 DIAGNOSIS — N1832 Chronic kidney disease, stage 3b: Secondary | ICD-10-CM | POA: Diagnosis not present

## 2022-09-03 DIAGNOSIS — I480 Paroxysmal atrial fibrillation: Secondary | ICD-10-CM | POA: Diagnosis not present

## 2022-09-03 DIAGNOSIS — I129 Hypertensive chronic kidney disease with stage 1 through stage 4 chronic kidney disease, or unspecified chronic kidney disease: Secondary | ICD-10-CM | POA: Diagnosis not present

## 2022-09-03 DIAGNOSIS — H903 Sensorineural hearing loss, bilateral: Secondary | ICD-10-CM | POA: Diagnosis not present

## 2022-09-03 DIAGNOSIS — E785 Hyperlipidemia, unspecified: Secondary | ICD-10-CM | POA: Diagnosis not present

## 2022-09-06 DIAGNOSIS — I129 Hypertensive chronic kidney disease with stage 1 through stage 4 chronic kidney disease, or unspecified chronic kidney disease: Secondary | ICD-10-CM | POA: Diagnosis not present

## 2022-09-06 DIAGNOSIS — D649 Anemia, unspecified: Secondary | ICD-10-CM | POA: Diagnosis not present

## 2022-09-06 DIAGNOSIS — H903 Sensorineural hearing loss, bilateral: Secondary | ICD-10-CM | POA: Diagnosis not present

## 2022-09-06 DIAGNOSIS — E785 Hyperlipidemia, unspecified: Secondary | ICD-10-CM | POA: Diagnosis not present

## 2022-09-06 DIAGNOSIS — I480 Paroxysmal atrial fibrillation: Secondary | ICD-10-CM | POA: Diagnosis not present

## 2022-09-06 DIAGNOSIS — N1832 Chronic kidney disease, stage 3b: Secondary | ICD-10-CM | POA: Diagnosis not present

## 2022-09-11 DIAGNOSIS — M545 Low back pain, unspecified: Secondary | ICD-10-CM | POA: Diagnosis not present

## 2022-09-11 DIAGNOSIS — M8580 Other specified disorders of bone density and structure, unspecified site: Secondary | ICD-10-CM | POA: Diagnosis not present

## 2022-09-11 DIAGNOSIS — N3281 Overactive bladder: Secondary | ICD-10-CM | POA: Diagnosis not present

## 2022-09-11 DIAGNOSIS — I872 Venous insufficiency (chronic) (peripheral): Secondary | ICD-10-CM | POA: Diagnosis not present

## 2022-09-11 DIAGNOSIS — K219 Gastro-esophageal reflux disease without esophagitis: Secondary | ICD-10-CM | POA: Diagnosis not present

## 2022-09-11 DIAGNOSIS — E876 Hypokalemia: Secondary | ICD-10-CM | POA: Diagnosis not present

## 2022-09-11 DIAGNOSIS — H1130 Conjunctival hemorrhage, unspecified eye: Secondary | ICD-10-CM | POA: Diagnosis not present

## 2022-09-11 DIAGNOSIS — D6859 Other primary thrombophilia: Secondary | ICD-10-CM | POA: Diagnosis not present

## 2022-09-11 DIAGNOSIS — J310 Chronic rhinitis: Secondary | ICD-10-CM | POA: Diagnosis not present

## 2022-09-11 DIAGNOSIS — E871 Hypo-osmolality and hyponatremia: Secondary | ICD-10-CM | POA: Diagnosis not present

## 2022-09-11 DIAGNOSIS — M810 Age-related osteoporosis without current pathological fracture: Secondary | ICD-10-CM | POA: Diagnosis not present

## 2022-09-11 DIAGNOSIS — H8110 Benign paroxysmal vertigo, unspecified ear: Secondary | ICD-10-CM | POA: Diagnosis not present

## 2022-09-11 DIAGNOSIS — G8929 Other chronic pain: Secondary | ICD-10-CM | POA: Diagnosis not present

## 2022-09-11 DIAGNOSIS — K5903 Drug induced constipation: Secondary | ICD-10-CM | POA: Diagnosis not present

## 2022-09-11 DIAGNOSIS — N1832 Chronic kidney disease, stage 3b: Secondary | ICD-10-CM | POA: Diagnosis not present

## 2022-09-11 DIAGNOSIS — D649 Anemia, unspecified: Secondary | ICD-10-CM | POA: Diagnosis not present

## 2022-09-11 DIAGNOSIS — I480 Paroxysmal atrial fibrillation: Secondary | ICD-10-CM | POA: Diagnosis not present

## 2022-09-11 DIAGNOSIS — E785 Hyperlipidemia, unspecified: Secondary | ICD-10-CM | POA: Diagnosis not present

## 2022-09-11 DIAGNOSIS — G4733 Obstructive sleep apnea (adult) (pediatric): Secondary | ICD-10-CM | POA: Diagnosis not present

## 2022-09-11 DIAGNOSIS — R1310 Dysphagia, unspecified: Secondary | ICD-10-CM | POA: Diagnosis not present

## 2022-09-11 DIAGNOSIS — F329 Major depressive disorder, single episode, unspecified: Secondary | ICD-10-CM | POA: Diagnosis not present

## 2022-09-11 DIAGNOSIS — I129 Hypertensive chronic kidney disease with stage 1 through stage 4 chronic kidney disease, or unspecified chronic kidney disease: Secondary | ICD-10-CM | POA: Diagnosis not present

## 2022-09-11 DIAGNOSIS — H903 Sensorineural hearing loss, bilateral: Secondary | ICD-10-CM | POA: Diagnosis not present

## 2022-09-11 DIAGNOSIS — K589 Irritable bowel syndrome without diarrhea: Secondary | ICD-10-CM | POA: Diagnosis not present

## 2022-09-11 DIAGNOSIS — M353 Polymyalgia rheumatica: Secondary | ICD-10-CM | POA: Diagnosis not present

## 2022-09-14 DIAGNOSIS — N1832 Chronic kidney disease, stage 3b: Secondary | ICD-10-CM | POA: Diagnosis not present

## 2022-09-14 DIAGNOSIS — E785 Hyperlipidemia, unspecified: Secondary | ICD-10-CM | POA: Diagnosis not present

## 2022-09-14 DIAGNOSIS — I129 Hypertensive chronic kidney disease with stage 1 through stage 4 chronic kidney disease, or unspecified chronic kidney disease: Secondary | ICD-10-CM | POA: Diagnosis not present

## 2022-09-14 DIAGNOSIS — D649 Anemia, unspecified: Secondary | ICD-10-CM | POA: Diagnosis not present

## 2022-09-14 DIAGNOSIS — I480 Paroxysmal atrial fibrillation: Secondary | ICD-10-CM | POA: Diagnosis not present

## 2022-09-14 DIAGNOSIS — H903 Sensorineural hearing loss, bilateral: Secondary | ICD-10-CM | POA: Diagnosis not present

## 2022-09-15 DIAGNOSIS — Z4889 Encounter for other specified surgical aftercare: Secondary | ICD-10-CM | POA: Diagnosis not present

## 2022-09-15 DIAGNOSIS — H90A31 Mixed conductive and sensorineural hearing loss, unilateral, right ear with restricted hearing on the contralateral side: Secondary | ICD-10-CM | POA: Diagnosis not present

## 2022-09-15 DIAGNOSIS — Z9089 Acquired absence of other organs: Secondary | ICD-10-CM | POA: Diagnosis not present

## 2022-09-15 DIAGNOSIS — H90A22 Sensorineural hearing loss, unilateral, left ear, with restricted hearing on the contralateral side: Secondary | ICD-10-CM | POA: Diagnosis not present

## 2022-09-20 DIAGNOSIS — H903 Sensorineural hearing loss, bilateral: Secondary | ICD-10-CM | POA: Diagnosis not present

## 2022-09-20 DIAGNOSIS — D649 Anemia, unspecified: Secondary | ICD-10-CM | POA: Diagnosis not present

## 2022-09-20 DIAGNOSIS — E785 Hyperlipidemia, unspecified: Secondary | ICD-10-CM | POA: Diagnosis not present

## 2022-09-20 DIAGNOSIS — Z7901 Long term (current) use of anticoagulants: Secondary | ICD-10-CM | POA: Diagnosis not present

## 2022-09-20 DIAGNOSIS — N390 Urinary tract infection, site not specified: Secondary | ICD-10-CM | POA: Diagnosis not present

## 2022-09-20 DIAGNOSIS — I129 Hypertensive chronic kidney disease with stage 1 through stage 4 chronic kidney disease, or unspecified chronic kidney disease: Secondary | ICD-10-CM | POA: Diagnosis not present

## 2022-09-20 DIAGNOSIS — N1832 Chronic kidney disease, stage 3b: Secondary | ICD-10-CM | POA: Diagnosis not present

## 2022-09-20 DIAGNOSIS — I480 Paroxysmal atrial fibrillation: Secondary | ICD-10-CM | POA: Diagnosis not present

## 2022-09-21 DIAGNOSIS — N1832 Chronic kidney disease, stage 3b: Secondary | ICD-10-CM | POA: Diagnosis not present

## 2022-09-21 DIAGNOSIS — G8929 Other chronic pain: Secondary | ICD-10-CM | POA: Diagnosis not present

## 2022-09-21 DIAGNOSIS — M810 Age-related osteoporosis without current pathological fracture: Secondary | ICD-10-CM | POA: Diagnosis not present

## 2022-09-21 DIAGNOSIS — F329 Major depressive disorder, single episode, unspecified: Secondary | ICD-10-CM | POA: Diagnosis not present

## 2022-09-21 DIAGNOSIS — E876 Hypokalemia: Secondary | ICD-10-CM | POA: Diagnosis not present

## 2022-09-21 DIAGNOSIS — I129 Hypertensive chronic kidney disease with stage 1 through stage 4 chronic kidney disease, or unspecified chronic kidney disease: Secondary | ICD-10-CM | POA: Diagnosis not present

## 2022-09-21 DIAGNOSIS — M545 Low back pain, unspecified: Secondary | ICD-10-CM | POA: Diagnosis not present

## 2022-09-21 DIAGNOSIS — E871 Hypo-osmolality and hyponatremia: Secondary | ICD-10-CM | POA: Diagnosis not present

## 2022-09-21 DIAGNOSIS — D631 Anemia in chronic kidney disease: Secondary | ICD-10-CM | POA: Diagnosis not present

## 2022-09-21 DIAGNOSIS — E785 Hyperlipidemia, unspecified: Secondary | ICD-10-CM | POA: Diagnosis not present

## 2022-09-21 DIAGNOSIS — H903 Sensorineural hearing loss, bilateral: Secondary | ICD-10-CM | POA: Diagnosis not present

## 2022-09-21 DIAGNOSIS — I480 Paroxysmal atrial fibrillation: Secondary | ICD-10-CM | POA: Diagnosis not present

## 2022-09-28 DIAGNOSIS — H9193 Unspecified hearing loss, bilateral: Secondary | ICD-10-CM | POA: Diagnosis not present

## 2022-10-05 DIAGNOSIS — L821 Other seborrheic keratosis: Secondary | ICD-10-CM | POA: Diagnosis not present

## 2022-10-05 DIAGNOSIS — L814 Other melanin hyperpigmentation: Secondary | ICD-10-CM | POA: Diagnosis not present

## 2022-10-05 DIAGNOSIS — L578 Other skin changes due to chronic exposure to nonionizing radiation: Secondary | ICD-10-CM | POA: Diagnosis not present

## 2022-10-05 DIAGNOSIS — D1801 Hemangioma of skin and subcutaneous tissue: Secondary | ICD-10-CM | POA: Diagnosis not present

## 2022-10-05 DIAGNOSIS — L820 Inflamed seborrheic keratosis: Secondary | ICD-10-CM | POA: Diagnosis not present

## 2022-10-05 DIAGNOSIS — D229 Melanocytic nevi, unspecified: Secondary | ICD-10-CM | POA: Diagnosis not present

## 2022-10-05 DIAGNOSIS — Z85828 Personal history of other malignant neoplasm of skin: Secondary | ICD-10-CM | POA: Diagnosis not present

## 2022-10-07 DIAGNOSIS — I480 Paroxysmal atrial fibrillation: Secondary | ICD-10-CM | POA: Diagnosis not present

## 2022-10-07 DIAGNOSIS — I129 Hypertensive chronic kidney disease with stage 1 through stage 4 chronic kidney disease, or unspecified chronic kidney disease: Secondary | ICD-10-CM | POA: Diagnosis not present

## 2022-10-07 DIAGNOSIS — D649 Anemia, unspecified: Secondary | ICD-10-CM | POA: Diagnosis not present

## 2022-10-07 DIAGNOSIS — H903 Sensorineural hearing loss, bilateral: Secondary | ICD-10-CM | POA: Diagnosis not present

## 2022-10-07 DIAGNOSIS — N390 Urinary tract infection, site not specified: Secondary | ICD-10-CM | POA: Diagnosis not present

## 2022-10-07 DIAGNOSIS — N1832 Chronic kidney disease, stage 3b: Secondary | ICD-10-CM | POA: Diagnosis not present

## 2022-10-07 DIAGNOSIS — E785 Hyperlipidemia, unspecified: Secondary | ICD-10-CM | POA: Diagnosis not present

## 2022-10-11 DIAGNOSIS — D649 Anemia, unspecified: Secondary | ICD-10-CM | POA: Diagnosis not present

## 2022-10-11 DIAGNOSIS — F329 Major depressive disorder, single episode, unspecified: Secondary | ICD-10-CM | POA: Diagnosis not present

## 2022-10-11 DIAGNOSIS — J45909 Unspecified asthma, uncomplicated: Secondary | ICD-10-CM | POA: Diagnosis not present

## 2022-10-11 DIAGNOSIS — E785 Hyperlipidemia, unspecified: Secondary | ICD-10-CM | POA: Diagnosis not present

## 2022-10-11 DIAGNOSIS — M150 Primary generalized (osteo)arthritis: Secondary | ICD-10-CM | POA: Diagnosis not present

## 2022-10-11 DIAGNOSIS — R1310 Dysphagia, unspecified: Secondary | ICD-10-CM | POA: Diagnosis not present

## 2022-10-11 DIAGNOSIS — H353120 Nonexudative age-related macular degeneration, left eye, stage unspecified: Secondary | ICD-10-CM | POA: Diagnosis not present

## 2022-10-11 DIAGNOSIS — M810 Age-related osteoporosis without current pathological fracture: Secondary | ICD-10-CM | POA: Diagnosis not present

## 2022-10-11 DIAGNOSIS — I480 Paroxysmal atrial fibrillation: Secondary | ICD-10-CM | POA: Diagnosis not present

## 2022-10-11 DIAGNOSIS — K589 Irritable bowel syndrome without diarrhea: Secondary | ICD-10-CM | POA: Diagnosis not present

## 2022-10-11 DIAGNOSIS — G8929 Other chronic pain: Secondary | ICD-10-CM | POA: Diagnosis not present

## 2022-10-11 DIAGNOSIS — Z87440 Personal history of urinary (tract) infections: Secondary | ICD-10-CM | POA: Diagnosis not present

## 2022-10-11 DIAGNOSIS — K219 Gastro-esophageal reflux disease without esophagitis: Secondary | ICD-10-CM | POA: Diagnosis not present

## 2022-10-11 DIAGNOSIS — J310 Chronic rhinitis: Secondary | ICD-10-CM | POA: Diagnosis not present

## 2022-10-11 DIAGNOSIS — M8580 Other specified disorders of bone density and structure, unspecified site: Secondary | ICD-10-CM | POA: Diagnosis not present

## 2022-10-11 DIAGNOSIS — I129 Hypertensive chronic kidney disease with stage 1 through stage 4 chronic kidney disease, or unspecified chronic kidney disease: Secondary | ICD-10-CM | POA: Diagnosis not present

## 2022-10-11 DIAGNOSIS — J42 Unspecified chronic bronchitis: Secondary | ICD-10-CM | POA: Diagnosis not present

## 2022-10-11 DIAGNOSIS — M545 Low back pain, unspecified: Secondary | ICD-10-CM | POA: Diagnosis not present

## 2022-10-11 DIAGNOSIS — H8110 Benign paroxysmal vertigo, unspecified ear: Secondary | ICD-10-CM | POA: Diagnosis not present

## 2022-10-11 DIAGNOSIS — I351 Nonrheumatic aortic (valve) insufficiency: Secondary | ICD-10-CM | POA: Diagnosis not present

## 2022-10-11 DIAGNOSIS — N1832 Chronic kidney disease, stage 3b: Secondary | ICD-10-CM | POA: Diagnosis not present

## 2022-10-11 DIAGNOSIS — H903 Sensorineural hearing loss, bilateral: Secondary | ICD-10-CM | POA: Diagnosis not present

## 2022-10-11 DIAGNOSIS — N3281 Overactive bladder: Secondary | ICD-10-CM | POA: Diagnosis not present

## 2022-10-11 DIAGNOSIS — G4733 Obstructive sleep apnea (adult) (pediatric): Secondary | ICD-10-CM | POA: Diagnosis not present

## 2022-10-14 DIAGNOSIS — I480 Paroxysmal atrial fibrillation: Secondary | ICD-10-CM | POA: Diagnosis not present

## 2022-10-14 DIAGNOSIS — Z87440 Personal history of urinary (tract) infections: Secondary | ICD-10-CM | POA: Diagnosis not present

## 2022-10-14 DIAGNOSIS — N1832 Chronic kidney disease, stage 3b: Secondary | ICD-10-CM | POA: Diagnosis not present

## 2022-10-14 DIAGNOSIS — I129 Hypertensive chronic kidney disease with stage 1 through stage 4 chronic kidney disease, or unspecified chronic kidney disease: Secondary | ICD-10-CM | POA: Diagnosis not present

## 2022-10-14 DIAGNOSIS — D649 Anemia, unspecified: Secondary | ICD-10-CM | POA: Diagnosis not present

## 2022-10-14 DIAGNOSIS — J45909 Unspecified asthma, uncomplicated: Secondary | ICD-10-CM | POA: Diagnosis not present

## 2022-10-18 DIAGNOSIS — R2689 Other abnormalities of gait and mobility: Secondary | ICD-10-CM | POA: Diagnosis not present

## 2022-10-18 DIAGNOSIS — L821 Other seborrheic keratosis: Secondary | ICD-10-CM | POA: Diagnosis not present

## 2022-10-18 DIAGNOSIS — Z7901 Long term (current) use of anticoagulants: Secondary | ICD-10-CM | POA: Diagnosis not present

## 2022-10-18 DIAGNOSIS — I480 Paroxysmal atrial fibrillation: Secondary | ICD-10-CM | POA: Diagnosis not present

## 2022-10-18 DIAGNOSIS — N390 Urinary tract infection, site not specified: Secondary | ICD-10-CM | POA: Diagnosis not present

## 2022-10-21 DIAGNOSIS — N390 Urinary tract infection, site not specified: Secondary | ICD-10-CM | POA: Diagnosis not present

## 2022-10-22 DIAGNOSIS — N390 Urinary tract infection, site not specified: Secondary | ICD-10-CM | POA: Diagnosis not present

## 2022-10-23 DIAGNOSIS — N390 Urinary tract infection, site not specified: Secondary | ICD-10-CM | POA: Diagnosis not present

## 2022-10-27 ENCOUNTER — Telehealth: Payer: Self-pay | Admitting: Cardiology

## 2022-10-27 DIAGNOSIS — D649 Anemia, unspecified: Secondary | ICD-10-CM | POA: Diagnosis not present

## 2022-10-27 DIAGNOSIS — I129 Hypertensive chronic kidney disease with stage 1 through stage 4 chronic kidney disease, or unspecified chronic kidney disease: Secondary | ICD-10-CM | POA: Diagnosis not present

## 2022-10-27 DIAGNOSIS — J45909 Unspecified asthma, uncomplicated: Secondary | ICD-10-CM | POA: Diagnosis not present

## 2022-10-27 DIAGNOSIS — I480 Paroxysmal atrial fibrillation: Secondary | ICD-10-CM | POA: Diagnosis not present

## 2022-10-27 DIAGNOSIS — Z87440 Personal history of urinary (tract) infections: Secondary | ICD-10-CM | POA: Diagnosis not present

## 2022-10-27 DIAGNOSIS — N1832 Chronic kidney disease, stage 3b: Secondary | ICD-10-CM | POA: Diagnosis not present

## 2022-10-27 NOTE — Telephone Encounter (Signed)
STAT if HR is under 50 or over 120 (normal HR is 60-100 beats per minute)  What is your heart rate? 54 currently after doing some exercise   Do you have a log of your heart rate readings (document readings)? At rest this morning when she woke up - HR was 44   Do you have any other symptoms? No

## 2022-10-27 NOTE — Telephone Encounter (Signed)
Spoke with patient's husband. Patient had PT this morning and her heart rate was 44 prior to exercise and 54 after. Husband was concerned that HR was too low.  Reviewed patient's records and she has a history of bradycardia. She is not currently symptomatic. No SOB, chest pain or dizziness.  Provided education on when to call EMS. Patient verbalized understanding and had no questions.    Husband requested to see a provider sooner than scheduled appointment in October. Scheduled with APP for 11/24/22.

## 2022-11-04 DIAGNOSIS — I480 Paroxysmal atrial fibrillation: Secondary | ICD-10-CM | POA: Diagnosis not present

## 2022-11-04 DIAGNOSIS — I129 Hypertensive chronic kidney disease with stage 1 through stage 4 chronic kidney disease, or unspecified chronic kidney disease: Secondary | ICD-10-CM | POA: Diagnosis not present

## 2022-11-04 DIAGNOSIS — N1832 Chronic kidney disease, stage 3b: Secondary | ICD-10-CM | POA: Diagnosis not present

## 2022-11-04 DIAGNOSIS — J45909 Unspecified asthma, uncomplicated: Secondary | ICD-10-CM | POA: Diagnosis not present

## 2022-11-04 DIAGNOSIS — Z87440 Personal history of urinary (tract) infections: Secondary | ICD-10-CM | POA: Diagnosis not present

## 2022-11-04 DIAGNOSIS — D649 Anemia, unspecified: Secondary | ICD-10-CM | POA: Diagnosis not present

## 2022-11-08 ENCOUNTER — Telehealth: Payer: Self-pay | Admitting: Cardiology

## 2022-11-08 DIAGNOSIS — D649 Anemia, unspecified: Secondary | ICD-10-CM | POA: Diagnosis not present

## 2022-11-08 DIAGNOSIS — I480 Paroxysmal atrial fibrillation: Secondary | ICD-10-CM | POA: Diagnosis not present

## 2022-11-08 DIAGNOSIS — Z87440 Personal history of urinary (tract) infections: Secondary | ICD-10-CM | POA: Diagnosis not present

## 2022-11-08 DIAGNOSIS — I129 Hypertensive chronic kidney disease with stage 1 through stage 4 chronic kidney disease, or unspecified chronic kidney disease: Secondary | ICD-10-CM | POA: Diagnosis not present

## 2022-11-08 DIAGNOSIS — N1832 Chronic kidney disease, stage 3b: Secondary | ICD-10-CM | POA: Diagnosis not present

## 2022-11-08 DIAGNOSIS — J45909 Unspecified asthma, uncomplicated: Secondary | ICD-10-CM | POA: Diagnosis not present

## 2022-11-08 NOTE — Telephone Encounter (Signed)
Called and spoke with husband regarding pt's heart rate.  He denies pt is having any symptoms related to this but appointment for evaluation rescheduled to 11/10/22 at the [facility] location.  They are aware to call back prior to then if any questions or concerns.   Called and spoke with [facility] and advised of appt date and time.  She reports husband is very impatent with pt and very dismissive of her needs.  She is concerned about how pt is being treated at home.  Recently, HR has been low staying in the 40-50 range and BP will easily increase 130/80 which is not normal for her.  Garnett Farm reports she has seen her weekly for a long time and this is not normal for her.  She also reports pt becomes fatigued very easy and has been lethargic recently.  She was grateful for the call back and information and that pt is to be evaluated 11/10/22.

## 2022-11-08 NOTE — Telephone Encounter (Signed)
Pt c/o BP issue: STAT if pt c/o blurred vision, one-sided weakness or slurred speech  1. What are your last 5 BP readings?  140/80 160/80 w activity  HR: 50, 46, 48, 52  2. Are you having any other symptoms (ex. Dizziness, headache, blurred vision, passed out)?  Per Garnett Farm, patient is lethargic and SOB  3. What is your BP issue?  Galina with [facility] is calling to report BP readings because it is elevated + HR is low

## 2022-11-10 ENCOUNTER — Ambulatory Visit (HOSPITAL_BASED_OUTPATIENT_CLINIC_OR_DEPARTMENT_OTHER): Payer: No Typology Code available for payment source | Admitting: Cardiology

## 2022-11-10 DIAGNOSIS — G8929 Other chronic pain: Secondary | ICD-10-CM | POA: Diagnosis not present

## 2022-11-10 DIAGNOSIS — M810 Age-related osteoporosis without current pathological fracture: Secondary | ICD-10-CM | POA: Diagnosis not present

## 2022-11-10 DIAGNOSIS — M150 Primary generalized (osteo)arthritis: Secondary | ICD-10-CM | POA: Diagnosis not present

## 2022-11-10 DIAGNOSIS — I351 Nonrheumatic aortic (valve) insufficiency: Secondary | ICD-10-CM | POA: Diagnosis not present

## 2022-11-10 DIAGNOSIS — G4733 Obstructive sleep apnea (adult) (pediatric): Secondary | ICD-10-CM | POA: Diagnosis not present

## 2022-11-10 DIAGNOSIS — J310 Chronic rhinitis: Secondary | ICD-10-CM | POA: Diagnosis not present

## 2022-11-10 DIAGNOSIS — I129 Hypertensive chronic kidney disease with stage 1 through stage 4 chronic kidney disease, or unspecified chronic kidney disease: Secondary | ICD-10-CM | POA: Diagnosis not present

## 2022-11-10 DIAGNOSIS — R1310 Dysphagia, unspecified: Secondary | ICD-10-CM | POA: Diagnosis not present

## 2022-11-10 DIAGNOSIS — E785 Hyperlipidemia, unspecified: Secondary | ICD-10-CM | POA: Diagnosis not present

## 2022-11-10 DIAGNOSIS — N1832 Chronic kidney disease, stage 3b: Secondary | ICD-10-CM | POA: Diagnosis not present

## 2022-11-10 DIAGNOSIS — N3281 Overactive bladder: Secondary | ICD-10-CM | POA: Diagnosis not present

## 2022-11-10 DIAGNOSIS — M8580 Other specified disorders of bone density and structure, unspecified site: Secondary | ICD-10-CM | POA: Diagnosis not present

## 2022-11-10 DIAGNOSIS — F329 Major depressive disorder, single episode, unspecified: Secondary | ICD-10-CM | POA: Diagnosis not present

## 2022-11-10 DIAGNOSIS — M545 Low back pain, unspecified: Secondary | ICD-10-CM | POA: Diagnosis not present

## 2022-11-10 DIAGNOSIS — Z87440 Personal history of urinary (tract) infections: Secondary | ICD-10-CM | POA: Diagnosis not present

## 2022-11-10 DIAGNOSIS — J42 Unspecified chronic bronchitis: Secondary | ICD-10-CM | POA: Diagnosis not present

## 2022-11-10 DIAGNOSIS — K219 Gastro-esophageal reflux disease without esophagitis: Secondary | ICD-10-CM | POA: Diagnosis not present

## 2022-11-10 DIAGNOSIS — H903 Sensorineural hearing loss, bilateral: Secondary | ICD-10-CM | POA: Diagnosis not present

## 2022-11-10 DIAGNOSIS — K589 Irritable bowel syndrome without diarrhea: Secondary | ICD-10-CM | POA: Diagnosis not present

## 2022-11-10 DIAGNOSIS — H353120 Nonexudative age-related macular degeneration, left eye, stage unspecified: Secondary | ICD-10-CM | POA: Diagnosis not present

## 2022-11-10 DIAGNOSIS — J45909 Unspecified asthma, uncomplicated: Secondary | ICD-10-CM | POA: Diagnosis not present

## 2022-11-10 DIAGNOSIS — I480 Paroxysmal atrial fibrillation: Secondary | ICD-10-CM | POA: Diagnosis not present

## 2022-11-10 DIAGNOSIS — D649 Anemia, unspecified: Secondary | ICD-10-CM | POA: Diagnosis not present

## 2022-11-10 DIAGNOSIS — H8110 Benign paroxysmal vertigo, unspecified ear: Secondary | ICD-10-CM | POA: Diagnosis not present

## 2022-11-12 DIAGNOSIS — M150 Primary generalized (osteo)arthritis: Secondary | ICD-10-CM | POA: Diagnosis not present

## 2022-11-12 DIAGNOSIS — N1832 Chronic kidney disease, stage 3b: Secondary | ICD-10-CM | POA: Diagnosis not present

## 2022-11-12 DIAGNOSIS — E785 Hyperlipidemia, unspecified: Secondary | ICD-10-CM | POA: Diagnosis not present

## 2022-11-12 DIAGNOSIS — J42 Unspecified chronic bronchitis: Secondary | ICD-10-CM | POA: Diagnosis not present

## 2022-11-12 DIAGNOSIS — H903 Sensorineural hearing loss, bilateral: Secondary | ICD-10-CM | POA: Diagnosis not present

## 2022-11-12 DIAGNOSIS — Z87440 Personal history of urinary (tract) infections: Secondary | ICD-10-CM | POA: Diagnosis not present

## 2022-11-12 DIAGNOSIS — I480 Paroxysmal atrial fibrillation: Secondary | ICD-10-CM | POA: Diagnosis not present

## 2022-11-12 DIAGNOSIS — I129 Hypertensive chronic kidney disease with stage 1 through stage 4 chronic kidney disease, or unspecified chronic kidney disease: Secondary | ICD-10-CM | POA: Diagnosis not present

## 2022-11-12 DIAGNOSIS — D649 Anemia, unspecified: Secondary | ICD-10-CM | POA: Diagnosis not present

## 2022-11-12 DIAGNOSIS — M545 Low back pain, unspecified: Secondary | ICD-10-CM | POA: Diagnosis not present

## 2022-11-12 DIAGNOSIS — I351 Nonrheumatic aortic (valve) insufficiency: Secondary | ICD-10-CM | POA: Diagnosis not present

## 2022-11-12 DIAGNOSIS — J45909 Unspecified asthma, uncomplicated: Secondary | ICD-10-CM | POA: Diagnosis not present

## 2022-11-15 DIAGNOSIS — N390 Urinary tract infection, site not specified: Secondary | ICD-10-CM | POA: Diagnosis not present

## 2022-11-15 DIAGNOSIS — I480 Paroxysmal atrial fibrillation: Secondary | ICD-10-CM | POA: Diagnosis not present

## 2022-11-15 DIAGNOSIS — Z7901 Long term (current) use of anticoagulants: Secondary | ICD-10-CM | POA: Diagnosis not present

## 2022-11-15 DIAGNOSIS — R2689 Other abnormalities of gait and mobility: Secondary | ICD-10-CM | POA: Diagnosis not present

## 2022-11-17 DIAGNOSIS — N1832 Chronic kidney disease, stage 3b: Secondary | ICD-10-CM | POA: Diagnosis not present

## 2022-11-17 DIAGNOSIS — I480 Paroxysmal atrial fibrillation: Secondary | ICD-10-CM | POA: Diagnosis not present

## 2022-11-17 DIAGNOSIS — D649 Anemia, unspecified: Secondary | ICD-10-CM | POA: Diagnosis not present

## 2022-11-17 DIAGNOSIS — J45909 Unspecified asthma, uncomplicated: Secondary | ICD-10-CM | POA: Diagnosis not present

## 2022-11-17 DIAGNOSIS — Z87440 Personal history of urinary (tract) infections: Secondary | ICD-10-CM | POA: Diagnosis not present

## 2022-11-17 DIAGNOSIS — I129 Hypertensive chronic kidney disease with stage 1 through stage 4 chronic kidney disease, or unspecified chronic kidney disease: Secondary | ICD-10-CM | POA: Diagnosis not present

## 2022-11-22 DIAGNOSIS — L821 Other seborrheic keratosis: Secondary | ICD-10-CM | POA: Diagnosis not present

## 2022-11-22 DIAGNOSIS — D1801 Hemangioma of skin and subcutaneous tissue: Secondary | ICD-10-CM | POA: Diagnosis not present

## 2022-11-23 DIAGNOSIS — D649 Anemia, unspecified: Secondary | ICD-10-CM | POA: Diagnosis not present

## 2022-11-23 DIAGNOSIS — Z87440 Personal history of urinary (tract) infections: Secondary | ICD-10-CM | POA: Diagnosis not present

## 2022-11-23 DIAGNOSIS — I480 Paroxysmal atrial fibrillation: Secondary | ICD-10-CM | POA: Diagnosis not present

## 2022-11-23 DIAGNOSIS — I129 Hypertensive chronic kidney disease with stage 1 through stage 4 chronic kidney disease, or unspecified chronic kidney disease: Secondary | ICD-10-CM | POA: Diagnosis not present

## 2022-11-23 DIAGNOSIS — N1832 Chronic kidney disease, stage 3b: Secondary | ICD-10-CM | POA: Diagnosis not present

## 2022-11-23 DIAGNOSIS — J45909 Unspecified asthma, uncomplicated: Secondary | ICD-10-CM | POA: Diagnosis not present

## 2022-11-24 ENCOUNTER — Ambulatory Visit: Payer: No Typology Code available for payment source | Admitting: Physician Assistant

## 2022-11-24 ENCOUNTER — Ambulatory Visit: Payer: Medicare Other | Attending: Cardiology | Admitting: Cardiology

## 2022-11-24 ENCOUNTER — Encounter: Payer: Self-pay | Admitting: Cardiology

## 2022-11-24 VITALS — BP 134/52 | HR 61 | Ht 64.0 in | Wt 160.0 lb

## 2022-11-24 DIAGNOSIS — I480 Paroxysmal atrial fibrillation: Secondary | ICD-10-CM | POA: Diagnosis not present

## 2022-11-24 DIAGNOSIS — E785 Hyperlipidemia, unspecified: Secondary | ICD-10-CM | POA: Insufficient documentation

## 2022-11-24 NOTE — Progress Notes (Signed)
Cardiology Office Note:  .   Date:  11/24/2022  ID:  [name], DOB Aug 11, 1933, MRN [id number] PCP: Garlan Fillers, MD  [facility] Providers Cardiologist:  Donato Schultz, MD    History of Present Illness: .   [name] is a 89 y.o. female Discussed the use of AI scribe software for clinical note transcription with the patient, who gave verbal consent to proceed.  History of Present Illness   The patient, with a history of atrial fibrillation, hyperlipidemia, chronic kidney disease, and a transient ischemic attack, presents with concerns about a low heart rate. The heart rate was measured at 44 beats per minute by her physical therapist, who expressed concern about this. The patient denies any symptoms associated with the low heart rate, such as fainting, chest pain, or palpitations. She does report occasional shortness of breath. She is currently taking a half dose of metoprolol nightly, which seems to be controlling her atrial fibrillation well. She had previously experienced issues when not taking metoprolol, but cannot recall the specifics.  The patient also has a history of a transient ischemic attack many years ago. She is currently on warfarin, which is checked monthly by her primary care physician. She denies any bleeding issues. She also has hyperlipidemia, which is being managed with atorvastatin. She denies any issues with this medication.  The patient has chronic kidney disease stage 3, with a creatinine level of 1.17. She is advised to avoid ibuprofen and Advil, and instead takes Tylenol for pain. She also has a history of stroke, with a CHADS-VASc score of 6, indicating a higher risk for stroke.       ROS: No syncope  Studies Reviewed: Marland Kitchen        LABS Hemoglobin: 9 (2022) LDL: 58 (09/28/2021) Creatinine: 1.17  DIAGNOSTIC Echocardiogram: Normal left ventricular ejection fraction (EF 70-75%), mild left ventricular hypertrophy, normal right ventricle, moderately  enlarged left atrium (04/27/2020)  Risk Assessment/Calculations:    CHA2DS2-VASc Score = 6   This indicates a 9.7% annual risk of stroke. The patient's score is based upon: CHF History: 0 HTN History: 1 Diabetes History: 0 Stroke History: 2 Vascular Disease History: 0 Age Score: 2 Gender Score: 1            Physical Exam:   VS:  BP (!) 134/52   Pulse 61   Ht 5\' 4"  (1.626 m)   Wt 160 lb (72.6 kg)   SpO2 99%   BMI 27.46 kg/m    Wt Readings from Last 3 Encounters:  11/24/22 160 lb (72.6 kg)  05/31/22 169 lb 9.6 oz (76.9 kg)  05/06/22 169 lb 8 oz (76.9 kg)    GEN: Well nourished, well developed in no acute distress, walker NECK: No JVD; No carotid bruits CARDIAC: RRR, no murmurs, rubs, gallops RESPIRATORY:  Clear to auscultation without rales, wheezing or rhonchi  ABDOMEN: Soft, non-tender, non-distended EXTREMITIES:  No edema; No deformity   ASSESSMENT AND PLAN: .    Assessment and Plan    Bradycardia Occasional heart rate in the 40s, no associated symptoms of syncope, chest pain, or palpitations. Currently on Metoprolol 12.5mg  nightly. -Continue Metoprolol 12.5mg  nightly. -Monitor for symptoms of bradycardia. -No indication for pacemaker  Parox Atrial Fibrillation Well controlled on low dose Metoprolol. CHADS-VASc score of 6, indicating high risk for stroke. -Continue Metoprolol 12.5mg  nightly. -Continue Warfarin, with monthly monitoring by primary care physician.  Hyperlipidemia Last LDL 58 on 09/28/2021, currently on Atorvastatin 20mg  daily. -Continue Atorvastatin 20mg  daily.  No myalgia  Chronic Kidney Disease Stage 3 Last creatinine 1.17. -Avoid NSAIDs.  Follow-up in 6 months with physician assistant, and in 1 year with physician.             Signed, Donato Schultz, MD

## 2022-11-24 NOTE — Patient Instructions (Signed)
Medication Instructions:  The current medical regimen is effective;  continue present plan and medications.  *If you need a refill on your cardiac medications before your next appointment, please call your pharmacy*  Follow-Up: At [facility], you and your health needs are our priority.  As part of our continuing mission to provide you with exceptional heart care, we have created designated Provider Care Teams.  These Care Teams include your primary Cardiologist (physician) and Advanced Practice Providers (APPs -  Physician Assistants and Nurse Practitioners) who all work together to provide you with the care you need, when you need it.  We recommend signing up for the patient portal called "MyChart".  Sign up information is provided on this After Visit Summary.  MyChart is used to connect with patients for Virtual Visits (Telemedicine).  Patients are able to view lab/test results, encounter notes, upcoming appointments, etc.  Non-urgent messages can be sent to your provider as well.   To learn more about what you can do with MyChart, go to [email or web address].    Your next appointment:   6 month(s)  Provider:   Jari Favre, PA-C, Robin Searing, NP, Eligha Bridegroom, NP, Tereso Newcomer, PA-C, or Perlie Gold, PA-C     Then, Donato Schultz, MD will plan to see you again in 1 year(s).

## 2022-11-30 DIAGNOSIS — H9042 Sensorineural hearing loss, unilateral, left ear, with unrestricted hearing on the contralateral side: Secondary | ICD-10-CM | POA: Diagnosis not present

## 2022-11-30 DIAGNOSIS — Z9621 Cochlear implant status: Secondary | ICD-10-CM | POA: Diagnosis not present

## 2022-11-30 DIAGNOSIS — Z9889 Other specified postprocedural states: Secondary | ICD-10-CM | POA: Diagnosis not present

## 2022-11-30 DIAGNOSIS — Z45321 Encounter for adjustment and management of cochlear device: Secondary | ICD-10-CM | POA: Diagnosis not present

## 2022-12-03 DIAGNOSIS — I129 Hypertensive chronic kidney disease with stage 1 through stage 4 chronic kidney disease, or unspecified chronic kidney disease: Secondary | ICD-10-CM | POA: Diagnosis not present

## 2022-12-03 DIAGNOSIS — J45909 Unspecified asthma, uncomplicated: Secondary | ICD-10-CM | POA: Diagnosis not present

## 2022-12-03 DIAGNOSIS — I480 Paroxysmal atrial fibrillation: Secondary | ICD-10-CM | POA: Diagnosis not present

## 2022-12-03 DIAGNOSIS — D649 Anemia, unspecified: Secondary | ICD-10-CM | POA: Diagnosis not present

## 2022-12-03 DIAGNOSIS — Z87440 Personal history of urinary (tract) infections: Secondary | ICD-10-CM | POA: Diagnosis not present

## 2022-12-03 DIAGNOSIS — N1832 Chronic kidney disease, stage 3b: Secondary | ICD-10-CM | POA: Diagnosis not present

## 2022-12-06 DIAGNOSIS — J45909 Unspecified asthma, uncomplicated: Secondary | ICD-10-CM | POA: Diagnosis not present

## 2022-12-06 DIAGNOSIS — D649 Anemia, unspecified: Secondary | ICD-10-CM | POA: Diagnosis not present

## 2022-12-06 DIAGNOSIS — I129 Hypertensive chronic kidney disease with stage 1 through stage 4 chronic kidney disease, or unspecified chronic kidney disease: Secondary | ICD-10-CM | POA: Diagnosis not present

## 2022-12-06 DIAGNOSIS — I480 Paroxysmal atrial fibrillation: Secondary | ICD-10-CM | POA: Diagnosis not present

## 2022-12-06 DIAGNOSIS — N1832 Chronic kidney disease, stage 3b: Secondary | ICD-10-CM | POA: Diagnosis not present

## 2022-12-06 DIAGNOSIS — Z87440 Personal history of urinary (tract) infections: Secondary | ICD-10-CM | POA: Diagnosis not present

## 2022-12-13 DIAGNOSIS — Z7901 Long term (current) use of anticoagulants: Secondary | ICD-10-CM | POA: Diagnosis not present

## 2022-12-13 DIAGNOSIS — D649 Anemia, unspecified: Secondary | ICD-10-CM | POA: Diagnosis not present

## 2022-12-13 DIAGNOSIS — M810 Age-related osteoporosis without current pathological fracture: Secondary | ICD-10-CM | POA: Diagnosis not present

## 2022-12-13 DIAGNOSIS — E785 Hyperlipidemia, unspecified: Secondary | ICD-10-CM | POA: Diagnosis not present

## 2022-12-13 DIAGNOSIS — E875 Hyperkalemia: Secondary | ICD-10-CM | POA: Diagnosis not present

## 2022-12-13 DIAGNOSIS — I10 Essential (primary) hypertension: Secondary | ICD-10-CM | POA: Diagnosis not present

## 2022-12-16 DIAGNOSIS — Z23 Encounter for immunization: Secondary | ICD-10-CM | POA: Diagnosis not present

## 2022-12-20 DIAGNOSIS — Z1331 Encounter for screening for depression: Secondary | ICD-10-CM | POA: Diagnosis not present

## 2022-12-20 DIAGNOSIS — Z0000 Encounter for general adult medical examination without abnormal findings: Secondary | ICD-10-CM | POA: Diagnosis not present

## 2022-12-20 DIAGNOSIS — I480 Paroxysmal atrial fibrillation: Secondary | ICD-10-CM | POA: Diagnosis not present

## 2022-12-20 DIAGNOSIS — K529 Noninfective gastroenteritis and colitis, unspecified: Secondary | ICD-10-CM | POA: Diagnosis not present

## 2022-12-20 DIAGNOSIS — D649 Anemia, unspecified: Secondary | ICD-10-CM | POA: Diagnosis not present

## 2022-12-20 DIAGNOSIS — Z7901 Long term (current) use of anticoagulants: Secondary | ICD-10-CM | POA: Diagnosis not present

## 2022-12-20 DIAGNOSIS — Z1339 Encounter for screening examination for other mental health and behavioral disorders: Secondary | ICD-10-CM | POA: Diagnosis not present

## 2022-12-20 DIAGNOSIS — Z23 Encounter for immunization: Secondary | ICD-10-CM | POA: Diagnosis not present

## 2022-12-24 DIAGNOSIS — N390 Urinary tract infection, site not specified: Secondary | ICD-10-CM | POA: Diagnosis not present

## 2022-12-28 DIAGNOSIS — I480 Paroxysmal atrial fibrillation: Secondary | ICD-10-CM | POA: Diagnosis not present

## 2022-12-28 DIAGNOSIS — Z7901 Long term (current) use of anticoagulants: Secondary | ICD-10-CM | POA: Diagnosis not present

## 2023-01-04 ENCOUNTER — Ambulatory Visit: Payer: Medicare Other | Admitting: Cardiology

## 2023-01-04 DIAGNOSIS — N390 Urinary tract infection, site not specified: Secondary | ICD-10-CM | POA: Diagnosis not present

## 2023-01-11 DIAGNOSIS — N390 Urinary tract infection, site not specified: Secondary | ICD-10-CM | POA: Diagnosis not present

## 2023-01-25 DIAGNOSIS — I63541 Cerebral infarction due to unspecified occlusion or stenosis of right cerebellar artery: Secondary | ICD-10-CM | POA: Diagnosis not present

## 2023-01-25 DIAGNOSIS — I480 Paroxysmal atrial fibrillation: Secondary | ICD-10-CM | POA: Diagnosis not present

## 2023-01-25 DIAGNOSIS — Z7901 Long term (current) use of anticoagulants: Secondary | ICD-10-CM | POA: Diagnosis not present

## 2023-02-18 DIAGNOSIS — I63541 Cerebral infarction due to unspecified occlusion or stenosis of right cerebellar artery: Secondary | ICD-10-CM | POA: Diagnosis not present

## 2023-02-18 DIAGNOSIS — I480 Paroxysmal atrial fibrillation: Secondary | ICD-10-CM | POA: Diagnosis not present

## 2023-02-18 DIAGNOSIS — Z7901 Long term (current) use of anticoagulants: Secondary | ICD-10-CM | POA: Diagnosis not present

## 2023-02-23 DIAGNOSIS — N390 Urinary tract infection, site not specified: Secondary | ICD-10-CM | POA: Diagnosis not present

## 2023-03-01 DIAGNOSIS — H9193 Unspecified hearing loss, bilateral: Secondary | ICD-10-CM | POA: Diagnosis not present

## 2023-03-01 DIAGNOSIS — Z45321 Encounter for adjustment and management of cochlear device: Secondary | ICD-10-CM | POA: Diagnosis not present

## 2023-03-01 DIAGNOSIS — Z9621 Cochlear implant status: Secondary | ICD-10-CM | POA: Diagnosis not present

## 2023-03-01 DIAGNOSIS — Z9889 Other specified postprocedural states: Secondary | ICD-10-CM | POA: Diagnosis not present

## 2023-03-02 DIAGNOSIS — D631 Anemia in chronic kidney disease: Secondary | ICD-10-CM | POA: Diagnosis not present

## 2023-03-02 DIAGNOSIS — H9192 Unspecified hearing loss, left ear: Secondary | ICD-10-CM | POA: Diagnosis not present

## 2023-03-02 DIAGNOSIS — N1832 Chronic kidney disease, stage 3b: Secondary | ICD-10-CM | POA: Diagnosis not present

## 2023-03-02 DIAGNOSIS — M542 Cervicalgia: Secondary | ICD-10-CM | POA: Diagnosis not present

## 2023-03-02 DIAGNOSIS — I890 Lymphedema, not elsewhere classified: Secondary | ICD-10-CM | POA: Diagnosis not present

## 2023-03-02 DIAGNOSIS — K219 Gastro-esophageal reflux disease without esophagitis: Secondary | ICD-10-CM | POA: Diagnosis not present

## 2023-03-02 DIAGNOSIS — M25562 Pain in left knee: Secondary | ICD-10-CM | POA: Diagnosis not present

## 2023-03-02 DIAGNOSIS — M8580 Other specified disorders of bone density and structure, unspecified site: Secondary | ICD-10-CM | POA: Diagnosis not present

## 2023-03-02 DIAGNOSIS — K589 Irritable bowel syndrome without diarrhea: Secondary | ICD-10-CM | POA: Diagnosis not present

## 2023-03-02 DIAGNOSIS — M25551 Pain in right hip: Secondary | ICD-10-CM | POA: Diagnosis not present

## 2023-03-02 DIAGNOSIS — Z7901 Long term (current) use of anticoagulants: Secondary | ICD-10-CM | POA: Diagnosis not present

## 2023-03-02 DIAGNOSIS — Z8673 Personal history of transient ischemic attack (TIA), and cerebral infarction without residual deficits: Secondary | ICD-10-CM | POA: Diagnosis not present

## 2023-03-02 DIAGNOSIS — M25512 Pain in left shoulder: Secondary | ICD-10-CM | POA: Diagnosis not present

## 2023-03-02 DIAGNOSIS — I129 Hypertensive chronic kidney disease with stage 1 through stage 4 chronic kidney disease, or unspecified chronic kidney disease: Secondary | ICD-10-CM | POA: Diagnosis not present

## 2023-03-02 DIAGNOSIS — I872 Venous insufficiency (chronic) (peripheral): Secondary | ICD-10-CM | POA: Diagnosis not present

## 2023-03-02 DIAGNOSIS — Z87440 Personal history of urinary (tract) infections: Secondary | ICD-10-CM | POA: Diagnosis not present

## 2023-03-02 DIAGNOSIS — Z853 Personal history of malignant neoplasm of breast: Secondary | ICD-10-CM | POA: Diagnosis not present

## 2023-03-02 DIAGNOSIS — M79673 Pain in unspecified foot: Secondary | ICD-10-CM | POA: Diagnosis not present

## 2023-03-02 DIAGNOSIS — I480 Paroxysmal atrial fibrillation: Secondary | ICD-10-CM | POA: Diagnosis not present

## 2023-03-02 DIAGNOSIS — M79605 Pain in left leg: Secondary | ICD-10-CM | POA: Diagnosis not present

## 2023-03-02 DIAGNOSIS — M810 Age-related osteoporosis without current pathological fracture: Secondary | ICD-10-CM | POA: Diagnosis not present

## 2023-03-02 DIAGNOSIS — G4733 Obstructive sleep apnea (adult) (pediatric): Secondary | ICD-10-CM | POA: Diagnosis not present

## 2023-03-02 DIAGNOSIS — M545 Low back pain, unspecified: Secondary | ICD-10-CM | POA: Diagnosis not present

## 2023-03-02 DIAGNOSIS — F329 Major depressive disorder, single episode, unspecified: Secondary | ICD-10-CM | POA: Diagnosis not present

## 2023-03-02 DIAGNOSIS — J310 Chronic rhinitis: Secondary | ICD-10-CM | POA: Diagnosis not present

## 2023-03-03 DIAGNOSIS — Z9621 Cochlear implant status: Secondary | ICD-10-CM | POA: Diagnosis not present

## 2023-03-03 DIAGNOSIS — Z4889 Encounter for other specified surgical aftercare: Secondary | ICD-10-CM | POA: Diagnosis not present

## 2023-03-03 DIAGNOSIS — G510 Bell's palsy: Secondary | ICD-10-CM | POA: Diagnosis not present

## 2023-03-03 DIAGNOSIS — Z9089 Acquired absence of other organs: Secondary | ICD-10-CM | POA: Diagnosis not present

## 2023-03-03 DIAGNOSIS — Q165 Congenital malformation of inner ear: Secondary | ICD-10-CM | POA: Diagnosis not present

## 2023-03-03 DIAGNOSIS — H903 Sensorineural hearing loss, bilateral: Secondary | ICD-10-CM | POA: Diagnosis not present

## 2023-03-03 DIAGNOSIS — H90A22 Sensorineural hearing loss, unilateral, left ear, with restricted hearing on the contralateral side: Secondary | ICD-10-CM | POA: Diagnosis not present

## 2023-03-08 DIAGNOSIS — D631 Anemia in chronic kidney disease: Secondary | ICD-10-CM | POA: Diagnosis not present

## 2023-03-08 DIAGNOSIS — I129 Hypertensive chronic kidney disease with stage 1 through stage 4 chronic kidney disease, or unspecified chronic kidney disease: Secondary | ICD-10-CM | POA: Diagnosis not present

## 2023-03-08 DIAGNOSIS — I480 Paroxysmal atrial fibrillation: Secondary | ICD-10-CM | POA: Diagnosis not present

## 2023-03-08 DIAGNOSIS — F329 Major depressive disorder, single episode, unspecified: Secondary | ICD-10-CM | POA: Diagnosis not present

## 2023-03-08 DIAGNOSIS — N1832 Chronic kidney disease, stage 3b: Secondary | ICD-10-CM | POA: Diagnosis not present

## 2023-03-08 DIAGNOSIS — M542 Cervicalgia: Secondary | ICD-10-CM | POA: Diagnosis not present

## 2023-03-09 DIAGNOSIS — N390 Urinary tract infection, site not specified: Secondary | ICD-10-CM | POA: Diagnosis not present

## 2023-03-11 DIAGNOSIS — D631 Anemia in chronic kidney disease: Secondary | ICD-10-CM | POA: Diagnosis not present

## 2023-03-11 DIAGNOSIS — M542 Cervicalgia: Secondary | ICD-10-CM | POA: Diagnosis not present

## 2023-03-11 DIAGNOSIS — I129 Hypertensive chronic kidney disease with stage 1 through stage 4 chronic kidney disease, or unspecified chronic kidney disease: Secondary | ICD-10-CM | POA: Diagnosis not present

## 2023-03-11 DIAGNOSIS — F329 Major depressive disorder, single episode, unspecified: Secondary | ICD-10-CM | POA: Diagnosis not present

## 2023-03-11 DIAGNOSIS — I480 Paroxysmal atrial fibrillation: Secondary | ICD-10-CM | POA: Diagnosis not present

## 2023-03-11 DIAGNOSIS — N1832 Chronic kidney disease, stage 3b: Secondary | ICD-10-CM | POA: Diagnosis not present

## 2023-03-15 DIAGNOSIS — F329 Major depressive disorder, single episode, unspecified: Secondary | ICD-10-CM | POA: Diagnosis not present

## 2023-03-15 DIAGNOSIS — M542 Cervicalgia: Secondary | ICD-10-CM | POA: Diagnosis not present

## 2023-03-15 DIAGNOSIS — I63541 Cerebral infarction due to unspecified occlusion or stenosis of right cerebellar artery: Secondary | ICD-10-CM | POA: Diagnosis not present

## 2023-03-15 DIAGNOSIS — D631 Anemia in chronic kidney disease: Secondary | ICD-10-CM | POA: Diagnosis not present

## 2023-03-15 DIAGNOSIS — I480 Paroxysmal atrial fibrillation: Secondary | ICD-10-CM | POA: Diagnosis not present

## 2023-03-15 DIAGNOSIS — N1832 Chronic kidney disease, stage 3b: Secondary | ICD-10-CM | POA: Diagnosis not present

## 2023-03-15 DIAGNOSIS — I129 Hypertensive chronic kidney disease with stage 1 through stage 4 chronic kidney disease, or unspecified chronic kidney disease: Secondary | ICD-10-CM | POA: Diagnosis not present

## 2023-03-15 DIAGNOSIS — Z7901 Long term (current) use of anticoagulants: Secondary | ICD-10-CM | POA: Diagnosis not present

## 2023-03-18 DIAGNOSIS — I480 Paroxysmal atrial fibrillation: Secondary | ICD-10-CM | POA: Diagnosis not present

## 2023-03-18 DIAGNOSIS — D631 Anemia in chronic kidney disease: Secondary | ICD-10-CM | POA: Diagnosis not present

## 2023-03-18 DIAGNOSIS — F329 Major depressive disorder, single episode, unspecified: Secondary | ICD-10-CM | POA: Diagnosis not present

## 2023-03-18 DIAGNOSIS — M542 Cervicalgia: Secondary | ICD-10-CM | POA: Diagnosis not present

## 2023-03-18 DIAGNOSIS — N1832 Chronic kidney disease, stage 3b: Secondary | ICD-10-CM | POA: Diagnosis not present

## 2023-03-18 DIAGNOSIS — I129 Hypertensive chronic kidney disease with stage 1 through stage 4 chronic kidney disease, or unspecified chronic kidney disease: Secondary | ICD-10-CM | POA: Diagnosis not present

## 2023-03-21 DIAGNOSIS — M542 Cervicalgia: Secondary | ICD-10-CM | POA: Diagnosis not present

## 2023-03-21 DIAGNOSIS — F329 Major depressive disorder, single episode, unspecified: Secondary | ICD-10-CM | POA: Diagnosis not present

## 2023-03-21 DIAGNOSIS — I129 Hypertensive chronic kidney disease with stage 1 through stage 4 chronic kidney disease, or unspecified chronic kidney disease: Secondary | ICD-10-CM | POA: Diagnosis not present

## 2023-03-21 DIAGNOSIS — D631 Anemia in chronic kidney disease: Secondary | ICD-10-CM | POA: Diagnosis not present

## 2023-03-21 DIAGNOSIS — N1832 Chronic kidney disease, stage 3b: Secondary | ICD-10-CM | POA: Diagnosis not present

## 2023-03-21 DIAGNOSIS — I480 Paroxysmal atrial fibrillation: Secondary | ICD-10-CM | POA: Diagnosis not present

## 2023-03-24 DIAGNOSIS — I480 Paroxysmal atrial fibrillation: Secondary | ICD-10-CM | POA: Diagnosis not present

## 2023-03-24 DIAGNOSIS — D631 Anemia in chronic kidney disease: Secondary | ICD-10-CM | POA: Diagnosis not present

## 2023-03-24 DIAGNOSIS — M542 Cervicalgia: Secondary | ICD-10-CM | POA: Diagnosis not present

## 2023-03-24 DIAGNOSIS — I129 Hypertensive chronic kidney disease with stage 1 through stage 4 chronic kidney disease, or unspecified chronic kidney disease: Secondary | ICD-10-CM | POA: Diagnosis not present

## 2023-03-24 DIAGNOSIS — F329 Major depressive disorder, single episode, unspecified: Secondary | ICD-10-CM | POA: Diagnosis not present

## 2023-03-24 DIAGNOSIS — N1832 Chronic kidney disease, stage 3b: Secondary | ICD-10-CM | POA: Diagnosis not present

## 2023-03-28 DIAGNOSIS — N390 Urinary tract infection, site not specified: Secondary | ICD-10-CM | POA: Diagnosis not present

## 2023-03-29 DIAGNOSIS — M542 Cervicalgia: Secondary | ICD-10-CM | POA: Diagnosis not present

## 2023-03-29 DIAGNOSIS — I129 Hypertensive chronic kidney disease with stage 1 through stage 4 chronic kidney disease, or unspecified chronic kidney disease: Secondary | ICD-10-CM | POA: Diagnosis not present

## 2023-03-29 DIAGNOSIS — D631 Anemia in chronic kidney disease: Secondary | ICD-10-CM | POA: Diagnosis not present

## 2023-03-29 DIAGNOSIS — I480 Paroxysmal atrial fibrillation: Secondary | ICD-10-CM | POA: Diagnosis not present

## 2023-03-29 DIAGNOSIS — F329 Major depressive disorder, single episode, unspecified: Secondary | ICD-10-CM | POA: Diagnosis not present

## 2023-03-29 DIAGNOSIS — N1832 Chronic kidney disease, stage 3b: Secondary | ICD-10-CM | POA: Diagnosis not present

## 2023-04-01 DIAGNOSIS — M79605 Pain in left leg: Secondary | ICD-10-CM | POA: Diagnosis not present

## 2023-04-01 DIAGNOSIS — Z7901 Long term (current) use of anticoagulants: Secondary | ICD-10-CM | POA: Diagnosis not present

## 2023-04-01 DIAGNOSIS — Z8673 Personal history of transient ischemic attack (TIA), and cerebral infarction without residual deficits: Secondary | ICD-10-CM | POA: Diagnosis not present

## 2023-04-01 DIAGNOSIS — I872 Venous insufficiency (chronic) (peripheral): Secondary | ICD-10-CM | POA: Diagnosis not present

## 2023-04-01 DIAGNOSIS — F329 Major depressive disorder, single episode, unspecified: Secondary | ICD-10-CM | POA: Diagnosis not present

## 2023-04-01 DIAGNOSIS — K589 Irritable bowel syndrome without diarrhea: Secondary | ICD-10-CM | POA: Diagnosis not present

## 2023-04-01 DIAGNOSIS — N1832 Chronic kidney disease, stage 3b: Secondary | ICD-10-CM | POA: Diagnosis not present

## 2023-04-01 DIAGNOSIS — M79673 Pain in unspecified foot: Secondary | ICD-10-CM | POA: Diagnosis not present

## 2023-04-01 DIAGNOSIS — G4733 Obstructive sleep apnea (adult) (pediatric): Secondary | ICD-10-CM | POA: Diagnosis not present

## 2023-04-01 DIAGNOSIS — I480 Paroxysmal atrial fibrillation: Secondary | ICD-10-CM | POA: Diagnosis not present

## 2023-04-01 DIAGNOSIS — J310 Chronic rhinitis: Secondary | ICD-10-CM | POA: Diagnosis not present

## 2023-04-01 DIAGNOSIS — M545 Low back pain, unspecified: Secondary | ICD-10-CM | POA: Diagnosis not present

## 2023-04-01 DIAGNOSIS — M25551 Pain in right hip: Secondary | ICD-10-CM | POA: Diagnosis not present

## 2023-04-01 DIAGNOSIS — M810 Age-related osteoporosis without current pathological fracture: Secondary | ICD-10-CM | POA: Diagnosis not present

## 2023-04-01 DIAGNOSIS — M8580 Other specified disorders of bone density and structure, unspecified site: Secondary | ICD-10-CM | POA: Diagnosis not present

## 2023-04-01 DIAGNOSIS — H9192 Unspecified hearing loss, left ear: Secondary | ICD-10-CM | POA: Diagnosis not present

## 2023-04-01 DIAGNOSIS — M25512 Pain in left shoulder: Secondary | ICD-10-CM | POA: Diagnosis not present

## 2023-04-01 DIAGNOSIS — I129 Hypertensive chronic kidney disease with stage 1 through stage 4 chronic kidney disease, or unspecified chronic kidney disease: Secondary | ICD-10-CM | POA: Diagnosis not present

## 2023-04-01 DIAGNOSIS — I890 Lymphedema, not elsewhere classified: Secondary | ICD-10-CM | POA: Diagnosis not present

## 2023-04-01 DIAGNOSIS — Z853 Personal history of malignant neoplasm of breast: Secondary | ICD-10-CM | POA: Diagnosis not present

## 2023-04-01 DIAGNOSIS — Z87440 Personal history of urinary (tract) infections: Secondary | ICD-10-CM | POA: Diagnosis not present

## 2023-04-01 DIAGNOSIS — M542 Cervicalgia: Secondary | ICD-10-CM | POA: Diagnosis not present

## 2023-04-01 DIAGNOSIS — M25562 Pain in left knee: Secondary | ICD-10-CM | POA: Diagnosis not present

## 2023-04-01 DIAGNOSIS — D631 Anemia in chronic kidney disease: Secondary | ICD-10-CM | POA: Diagnosis not present

## 2023-04-01 DIAGNOSIS — K219 Gastro-esophageal reflux disease without esophagitis: Secondary | ICD-10-CM | POA: Diagnosis not present

## 2023-04-05 DIAGNOSIS — N1832 Chronic kidney disease, stage 3b: Secondary | ICD-10-CM | POA: Diagnosis not present

## 2023-04-05 DIAGNOSIS — I480 Paroxysmal atrial fibrillation: Secondary | ICD-10-CM | POA: Diagnosis not present

## 2023-04-05 DIAGNOSIS — M542 Cervicalgia: Secondary | ICD-10-CM | POA: Diagnosis not present

## 2023-04-05 DIAGNOSIS — F329 Major depressive disorder, single episode, unspecified: Secondary | ICD-10-CM | POA: Diagnosis not present

## 2023-04-05 DIAGNOSIS — D631 Anemia in chronic kidney disease: Secondary | ICD-10-CM | POA: Diagnosis not present

## 2023-04-05 DIAGNOSIS — I129 Hypertensive chronic kidney disease with stage 1 through stage 4 chronic kidney disease, or unspecified chronic kidney disease: Secondary | ICD-10-CM | POA: Diagnosis not present

## 2023-04-07 DIAGNOSIS — N390 Urinary tract infection, site not specified: Secondary | ICD-10-CM | POA: Diagnosis not present

## 2023-04-13 DIAGNOSIS — F329 Major depressive disorder, single episode, unspecified: Secondary | ICD-10-CM | POA: Diagnosis not present

## 2023-04-13 DIAGNOSIS — I480 Paroxysmal atrial fibrillation: Secondary | ICD-10-CM | POA: Diagnosis not present

## 2023-04-13 DIAGNOSIS — N1832 Chronic kidney disease, stage 3b: Secondary | ICD-10-CM | POA: Diagnosis not present

## 2023-04-13 DIAGNOSIS — D631 Anemia in chronic kidney disease: Secondary | ICD-10-CM | POA: Diagnosis not present

## 2023-04-13 DIAGNOSIS — I129 Hypertensive chronic kidney disease with stage 1 through stage 4 chronic kidney disease, or unspecified chronic kidney disease: Secondary | ICD-10-CM | POA: Diagnosis not present

## 2023-04-13 DIAGNOSIS — M542 Cervicalgia: Secondary | ICD-10-CM | POA: Diagnosis not present

## 2023-04-19 DIAGNOSIS — I480 Paroxysmal atrial fibrillation: Secondary | ICD-10-CM | POA: Diagnosis not present

## 2023-04-19 DIAGNOSIS — Z7901 Long term (current) use of anticoagulants: Secondary | ICD-10-CM | POA: Diagnosis not present

## 2023-04-22 DIAGNOSIS — I480 Paroxysmal atrial fibrillation: Secondary | ICD-10-CM | POA: Diagnosis not present

## 2023-04-22 DIAGNOSIS — M542 Cervicalgia: Secondary | ICD-10-CM | POA: Diagnosis not present

## 2023-04-22 DIAGNOSIS — F329 Major depressive disorder, single episode, unspecified: Secondary | ICD-10-CM | POA: Diagnosis not present

## 2023-04-22 DIAGNOSIS — D631 Anemia in chronic kidney disease: Secondary | ICD-10-CM | POA: Diagnosis not present

## 2023-04-22 DIAGNOSIS — I129 Hypertensive chronic kidney disease with stage 1 through stage 4 chronic kidney disease, or unspecified chronic kidney disease: Secondary | ICD-10-CM | POA: Diagnosis not present

## 2023-04-22 DIAGNOSIS — N1832 Chronic kidney disease, stage 3b: Secondary | ICD-10-CM | POA: Diagnosis not present

## 2023-04-26 DIAGNOSIS — N390 Urinary tract infection, site not specified: Secondary | ICD-10-CM | POA: Diagnosis not present

## 2023-04-29 DIAGNOSIS — D631 Anemia in chronic kidney disease: Secondary | ICD-10-CM | POA: Diagnosis not present

## 2023-04-29 DIAGNOSIS — I480 Paroxysmal atrial fibrillation: Secondary | ICD-10-CM | POA: Diagnosis not present

## 2023-04-29 DIAGNOSIS — N1832 Chronic kidney disease, stage 3b: Secondary | ICD-10-CM | POA: Diagnosis not present

## 2023-04-29 DIAGNOSIS — M542 Cervicalgia: Secondary | ICD-10-CM | POA: Diagnosis not present

## 2023-04-29 DIAGNOSIS — I129 Hypertensive chronic kidney disease with stage 1 through stage 4 chronic kidney disease, or unspecified chronic kidney disease: Secondary | ICD-10-CM | POA: Diagnosis not present

## 2023-04-29 DIAGNOSIS — F329 Major depressive disorder, single episode, unspecified: Secondary | ICD-10-CM | POA: Diagnosis not present

## 2023-05-05 DIAGNOSIS — N390 Urinary tract infection, site not specified: Secondary | ICD-10-CM | POA: Diagnosis not present

## 2023-05-16 DIAGNOSIS — N390 Urinary tract infection, site not specified: Secondary | ICD-10-CM | POA: Diagnosis not present

## 2023-05-18 DIAGNOSIS — H02002 Unspecified entropion of right lower eyelid: Secondary | ICD-10-CM | POA: Diagnosis not present

## 2023-05-18 DIAGNOSIS — Z8669 Personal history of other diseases of the nervous system and sense organs: Secondary | ICD-10-CM | POA: Diagnosis not present

## 2023-05-18 DIAGNOSIS — H26491 Other secondary cataract, right eye: Secondary | ICD-10-CM | POA: Diagnosis not present

## 2023-05-18 DIAGNOSIS — Z961 Presence of intraocular lens: Secondary | ICD-10-CM | POA: Diagnosis not present

## 2023-05-18 DIAGNOSIS — H353132 Nonexudative age-related macular degeneration, bilateral, intermediate dry stage: Secondary | ICD-10-CM | POA: Diagnosis not present

## 2023-05-18 DIAGNOSIS — Z9889 Other specified postprocedural states: Secondary | ICD-10-CM | POA: Diagnosis not present

## 2023-05-18 DIAGNOSIS — H02403 Unspecified ptosis of bilateral eyelids: Secondary | ICD-10-CM | POA: Diagnosis not present

## 2023-05-18 DIAGNOSIS — H02052 Trichiasis without entropian right lower eyelid: Secondary | ICD-10-CM | POA: Diagnosis not present

## 2023-05-27 ENCOUNTER — Ambulatory Visit: Payer: Medicare Other | Attending: Cardiology | Admitting: Cardiology

## 2023-06-07 DIAGNOSIS — I480 Paroxysmal atrial fibrillation: Secondary | ICD-10-CM | POA: Diagnosis not present

## 2023-06-07 DIAGNOSIS — Z7901 Long term (current) use of anticoagulants: Secondary | ICD-10-CM | POA: Diagnosis not present

## 2023-06-07 DIAGNOSIS — I63541 Cerebral infarction due to unspecified occlusion or stenosis of right cerebellar artery: Secondary | ICD-10-CM | POA: Diagnosis not present

## 2023-06-09 DIAGNOSIS — Z23 Encounter for immunization: Secondary | ICD-10-CM | POA: Diagnosis not present

## 2023-06-16 DIAGNOSIS — N390 Urinary tract infection, site not specified: Secondary | ICD-10-CM | POA: Diagnosis not present

## 2023-06-21 DIAGNOSIS — H04129 Dry eye syndrome of unspecified lacrimal gland: Secondary | ICD-10-CM | POA: Diagnosis not present

## 2023-06-21 DIAGNOSIS — H3322 Serous retinal detachment, left eye: Secondary | ICD-10-CM | POA: Diagnosis not present

## 2023-06-21 DIAGNOSIS — H353132 Nonexudative age-related macular degeneration, bilateral, intermediate dry stage: Secondary | ICD-10-CM | POA: Diagnosis not present

## 2023-06-21 DIAGNOSIS — H3581 Retinal edema: Secondary | ICD-10-CM | POA: Diagnosis not present

## 2023-06-21 DIAGNOSIS — Z961 Presence of intraocular lens: Secondary | ICD-10-CM | POA: Diagnosis not present

## 2023-06-29 DIAGNOSIS — N390 Urinary tract infection, site not specified: Secondary | ICD-10-CM | POA: Diagnosis not present

## 2023-07-04 DIAGNOSIS — N390 Urinary tract infection, site not specified: Secondary | ICD-10-CM | POA: Diagnosis not present

## 2023-07-12 DIAGNOSIS — I63541 Cerebral infarction due to unspecified occlusion or stenosis of right cerebellar artery: Secondary | ICD-10-CM | POA: Diagnosis not present

## 2023-07-12 DIAGNOSIS — I480 Paroxysmal atrial fibrillation: Secondary | ICD-10-CM | POA: Diagnosis not present

## 2023-07-12 DIAGNOSIS — Z7901 Long term (current) use of anticoagulants: Secondary | ICD-10-CM | POA: Diagnosis not present

## 2023-07-14 DIAGNOSIS — N390 Urinary tract infection, site not specified: Secondary | ICD-10-CM | POA: Diagnosis not present

## 2023-07-25 DIAGNOSIS — N390 Urinary tract infection, site not specified: Secondary | ICD-10-CM | POA: Diagnosis not present

## 2023-08-03 DIAGNOSIS — N390 Urinary tract infection, site not specified: Secondary | ICD-10-CM | POA: Diagnosis not present

## 2023-08-04 DIAGNOSIS — I63541 Cerebral infarction due to unspecified occlusion or stenosis of right cerebellar artery: Secondary | ICD-10-CM | POA: Diagnosis not present

## 2023-08-04 DIAGNOSIS — Z7901 Long term (current) use of anticoagulants: Secondary | ICD-10-CM | POA: Diagnosis not present

## 2023-08-04 DIAGNOSIS — I480 Paroxysmal atrial fibrillation: Secondary | ICD-10-CM | POA: Diagnosis not present

## 2023-08-15 DIAGNOSIS — R2681 Unsteadiness on feet: Secondary | ICD-10-CM | POA: Diagnosis not present

## 2023-08-15 DIAGNOSIS — R29898 Other symptoms and signs involving the musculoskeletal system: Secondary | ICD-10-CM | POA: Diagnosis not present

## 2023-08-15 DIAGNOSIS — N390 Urinary tract infection, site not specified: Secondary | ICD-10-CM | POA: Diagnosis not present

## 2023-08-15 DIAGNOSIS — I69354 Hemiplegia and hemiparesis following cerebral infarction affecting left non-dominant side: Secondary | ICD-10-CM | POA: Diagnosis not present

## 2023-08-16 DIAGNOSIS — Z853 Personal history of malignant neoplasm of breast: Secondary | ICD-10-CM | POA: Diagnosis not present

## 2023-08-16 DIAGNOSIS — G4733 Obstructive sleep apnea (adult) (pediatric): Secondary | ICD-10-CM | POA: Diagnosis not present

## 2023-08-16 DIAGNOSIS — Z87440 Personal history of urinary (tract) infections: Secondary | ICD-10-CM | POA: Diagnosis not present

## 2023-08-16 DIAGNOSIS — M545 Low back pain, unspecified: Secondary | ICD-10-CM | POA: Diagnosis not present

## 2023-08-16 DIAGNOSIS — Z9181 History of falling: Secondary | ICD-10-CM | POA: Diagnosis not present

## 2023-08-16 DIAGNOSIS — K219 Gastro-esophageal reflux disease without esophagitis: Secondary | ICD-10-CM | POA: Diagnosis not present

## 2023-08-16 DIAGNOSIS — I351 Nonrheumatic aortic (valve) insufficiency: Secondary | ICD-10-CM | POA: Diagnosis not present

## 2023-08-16 DIAGNOSIS — I480 Paroxysmal atrial fibrillation: Secondary | ICD-10-CM | POA: Diagnosis not present

## 2023-08-16 DIAGNOSIS — M8580 Other specified disorders of bone density and structure, unspecified site: Secondary | ICD-10-CM | POA: Diagnosis not present

## 2023-08-16 DIAGNOSIS — I872 Venous insufficiency (chronic) (peripheral): Secondary | ICD-10-CM | POA: Diagnosis not present

## 2023-08-16 DIAGNOSIS — F32A Depression, unspecified: Secondary | ICD-10-CM | POA: Diagnosis not present

## 2023-08-16 DIAGNOSIS — F0393 Unspecified dementia, unspecified severity, with mood disturbance: Secondary | ICD-10-CM | POA: Diagnosis not present

## 2023-08-16 DIAGNOSIS — Z7901 Long term (current) use of anticoagulants: Secondary | ICD-10-CM | POA: Diagnosis not present

## 2023-08-16 DIAGNOSIS — G8929 Other chronic pain: Secondary | ICD-10-CM | POA: Diagnosis not present

## 2023-08-16 DIAGNOSIS — I69354 Hemiplegia and hemiparesis following cerebral infarction affecting left non-dominant side: Secondary | ICD-10-CM | POA: Diagnosis not present

## 2023-08-16 DIAGNOSIS — K580 Irritable bowel syndrome with diarrhea: Secondary | ICD-10-CM | POA: Diagnosis not present

## 2023-08-16 DIAGNOSIS — D631 Anemia in chronic kidney disease: Secondary | ICD-10-CM | POA: Diagnosis not present

## 2023-08-16 DIAGNOSIS — I129 Hypertensive chronic kidney disease with stage 1 through stage 4 chronic kidney disease, or unspecified chronic kidney disease: Secondary | ICD-10-CM | POA: Diagnosis not present

## 2023-08-16 DIAGNOSIS — I890 Lymphedema, not elsewhere classified: Secondary | ICD-10-CM | POA: Diagnosis not present

## 2023-08-16 DIAGNOSIS — N1832 Chronic kidney disease, stage 3b: Secondary | ICD-10-CM | POA: Diagnosis not present

## 2023-08-16 DIAGNOSIS — F03918 Unspecified dementia, unspecified severity, with other behavioral disturbance: Secondary | ICD-10-CM | POA: Diagnosis not present

## 2023-08-23 DIAGNOSIS — G4733 Obstructive sleep apnea (adult) (pediatric): Secondary | ICD-10-CM | POA: Diagnosis not present

## 2023-08-23 DIAGNOSIS — F03918 Unspecified dementia, unspecified severity, with other behavioral disturbance: Secondary | ICD-10-CM | POA: Diagnosis not present

## 2023-08-23 DIAGNOSIS — F0393 Unspecified dementia, unspecified severity, with mood disturbance: Secondary | ICD-10-CM | POA: Diagnosis not present

## 2023-08-23 DIAGNOSIS — I69354 Hemiplegia and hemiparesis following cerebral infarction affecting left non-dominant side: Secondary | ICD-10-CM | POA: Diagnosis not present

## 2023-08-23 DIAGNOSIS — I480 Paroxysmal atrial fibrillation: Secondary | ICD-10-CM | POA: Diagnosis not present

## 2023-08-23 DIAGNOSIS — F32A Depression, unspecified: Secondary | ICD-10-CM | POA: Diagnosis not present

## 2023-08-26 DIAGNOSIS — F03918 Unspecified dementia, unspecified severity, with other behavioral disturbance: Secondary | ICD-10-CM | POA: Diagnosis not present

## 2023-08-26 DIAGNOSIS — G4733 Obstructive sleep apnea (adult) (pediatric): Secondary | ICD-10-CM | POA: Diagnosis not present

## 2023-08-26 DIAGNOSIS — F32A Depression, unspecified: Secondary | ICD-10-CM | POA: Diagnosis not present

## 2023-08-26 DIAGNOSIS — R399 Unspecified symptoms and signs involving the genitourinary system: Secondary | ICD-10-CM | POA: Diagnosis not present

## 2023-08-26 DIAGNOSIS — I69354 Hemiplegia and hemiparesis following cerebral infarction affecting left non-dominant side: Secondary | ICD-10-CM | POA: Diagnosis not present

## 2023-08-26 DIAGNOSIS — I480 Paroxysmal atrial fibrillation: Secondary | ICD-10-CM | POA: Diagnosis not present

## 2023-08-26 DIAGNOSIS — F0393 Unspecified dementia, unspecified severity, with mood disturbance: Secondary | ICD-10-CM | POA: Diagnosis not present

## 2023-08-30 ENCOUNTER — Emergency Department (HOSPITAL_BASED_OUTPATIENT_CLINIC_OR_DEPARTMENT_OTHER)

## 2023-08-30 ENCOUNTER — Encounter (HOSPITAL_BASED_OUTPATIENT_CLINIC_OR_DEPARTMENT_OTHER): Payer: Self-pay

## 2023-08-30 ENCOUNTER — Other Ambulatory Visit: Payer: Self-pay

## 2023-08-30 ENCOUNTER — Emergency Department (HOSPITAL_BASED_OUTPATIENT_CLINIC_OR_DEPARTMENT_OTHER)
Admission: EM | Admit: 2023-08-30 | Discharge: 2023-08-30 | Disposition: A | Discharge status: Home / Self Care | Attending: Emergency Medicine | Admitting: Emergency Medicine

## 2023-08-30 DIAGNOSIS — Z91040 Latex allergy status: Secondary | ICD-10-CM | POA: Insufficient documentation

## 2023-08-30 DIAGNOSIS — G8929 Other chronic pain: Secondary | ICD-10-CM | POA: Diagnosis not present

## 2023-08-30 DIAGNOSIS — Z5329 Procedure and treatment not carried out because of patient's decision for other reasons: Secondary | ICD-10-CM | POA: Diagnosis not present

## 2023-08-30 DIAGNOSIS — R7989 Other specified abnormal findings of blood chemistry: Secondary | ICD-10-CM | POA: Insufficient documentation

## 2023-08-30 DIAGNOSIS — I872 Venous insufficiency (chronic) (peripheral): Secondary | ICD-10-CM | POA: Diagnosis not present

## 2023-08-30 DIAGNOSIS — F0393 Unspecified dementia, unspecified severity, with mood disturbance: Secondary | ICD-10-CM | POA: Diagnosis not present

## 2023-08-30 DIAGNOSIS — I69354 Hemiplegia and hemiparesis following cerebral infarction affecting left non-dominant side: Secondary | ICD-10-CM | POA: Diagnosis not present

## 2023-08-30 DIAGNOSIS — R600 Localized edema: Secondary | ICD-10-CM | POA: Diagnosis not present

## 2023-08-30 DIAGNOSIS — N1832 Chronic kidney disease, stage 3b: Secondary | ICD-10-CM | POA: Diagnosis not present

## 2023-08-30 DIAGNOSIS — Z7901 Long term (current) use of anticoagulants: Secondary | ICD-10-CM | POA: Diagnosis not present

## 2023-08-30 DIAGNOSIS — K219 Gastro-esophageal reflux disease without esophagitis: Secondary | ICD-10-CM | POA: Diagnosis not present

## 2023-08-30 DIAGNOSIS — I129 Hypertensive chronic kidney disease with stage 1 through stage 4 chronic kidney disease, or unspecified chronic kidney disease: Secondary | ICD-10-CM | POA: Diagnosis not present

## 2023-08-30 DIAGNOSIS — F03918 Unspecified dementia, unspecified severity, with other behavioral disturbance: Secondary | ICD-10-CM | POA: Diagnosis not present

## 2023-08-30 DIAGNOSIS — F32A Depression, unspecified: Secondary | ICD-10-CM | POA: Diagnosis not present

## 2023-08-30 DIAGNOSIS — M7989 Other specified soft tissue disorders: Secondary | ICD-10-CM | POA: Diagnosis not present

## 2023-08-30 DIAGNOSIS — I480 Paroxysmal atrial fibrillation: Secondary | ICD-10-CM | POA: Diagnosis not present

## 2023-08-30 DIAGNOSIS — G4733 Obstructive sleep apnea (adult) (pediatric): Secondary | ICD-10-CM | POA: Diagnosis not present

## 2023-08-30 DIAGNOSIS — D631 Anemia in chronic kidney disease: Secondary | ICD-10-CM | POA: Diagnosis not present

## 2023-08-30 LAB — COMPREHENSIVE METABOLIC PANEL WITH GFR
ALT: 17 U/L (ref 0–44)
AST: 20 U/L (ref 15–41)
Albumin: 4.1 g/dL (ref 3.5–5.0)
Alkaline Phosphatase: 143 U/L — ABNORMAL HIGH (ref 38–126)
Anion gap: 13 (ref 5–15)
BUN: 34 mg/dL — ABNORMAL HIGH (ref 8–23)
CO2: 24 mmol/L (ref 22–32)
Calcium: 9.7 mg/dL (ref 8.9–10.3)
Chloride: 104 mmol/L (ref 98–111)
Creatinine, Ser: 1.27 mg/dL — ABNORMAL HIGH (ref 0.44–1.00)
GFR, Estimated: 40 mL/min — ABNORMAL LOW (ref >60)
Glucose, Bld: 101 mg/dL — ABNORMAL HIGH (ref 70–99)
Potassium: 4.1 mmol/L (ref 3.5–5.1)
Sodium: 140 mmol/L (ref 135–145)
Total Bilirubin: 0.3 mg/dL (ref 0.0–1.2)
Total Protein: 7.4 g/dL (ref 6.5–8.1)

## 2023-08-30 LAB — CBC WITH DIFFERENTIAL/PLATELET
Abs Immature Granulocytes: 0.01 10*3/uL (ref 0.00–0.07)
Basophils Absolute: 0 10*3/uL (ref 0.0–0.1)
Basophils Relative: 1 %
Eosinophils Absolute: 0.2 10*3/uL (ref 0.0–0.5)
Eosinophils Relative: 4 %
HCT: 33.1 % — ABNORMAL LOW (ref 36.0–46.0)
Hemoglobin: 10.4 g/dL — ABNORMAL LOW (ref 12.0–15.0)
Immature Granulocytes: 0 %
Lymphocytes Relative: 23 %
Lymphs Abs: 1.3 10*3/uL (ref 0.7–4.0)
MCH: 28.6 pg (ref 26.0–34.0)
MCHC: 31.4 g/dL (ref 30.0–36.0)
MCV: 90.9 fL (ref 80.0–100.0)
Monocytes Absolute: 0.4 10*3/uL (ref 0.1–1.0)
Monocytes Relative: 8 %
Neutro Abs: 3.6 10*3/uL (ref 1.7–7.7)
Neutrophils Relative %: 64 %
Platelets: 245 10*3/uL (ref 150–400)
RBC: 3.64 MIL/uL — ABNORMAL LOW (ref 3.87–5.11)
RDW: 13.4 % (ref 11.5–15.5)
WBC: 5.6 10*3/uL (ref 4.0–10.5)
nRBC: 0 % (ref 0.0–0.2)

## 2023-08-30 NOTE — ED Triage Notes (Signed)
 Pt c/o L calf/ foot swelling x "mos, but more severe today," PT advised to be evaluated for DVT. Compliant w home coumadin 

## 2023-08-30 NOTE — ED Provider Notes (Signed)
 [place] EMERGENCY DEPARTMENT AT [facility] Provider Note   CSN: [id number] Arrival date & time: 08/30/23  1458     History  Chief Complaint  Patient presents with   Leg Pain    L    [name] is a 89 y.o. female.   Leg Pain    Patient presented to the ED with complaints of leg swelling.  Patient states her physical therapist noticed that her leg appeared swollen today.  Patient was also having some soreness in her leg.  Therapist recommended that she come to the ED to make sure she did not have a blood clot.  Patient states she is on Coumadin  and has been taking that medication regularly.  Patient denies any falls or injuries.  No fevers or chills.  Home Medications Prior to Admission medications   Medication Sig Start Date End Date Taking? Authorizing Provider  fosfomycin (MONUROL) 3 g PACK Take 3 g by mouth every 3 (three) days. 08/20/23  Yes [provider]  nitrofurantoin , macrocrystal-monohydrate, (MACROBID ) 100 MG capsule Take 100 mg by mouth 2 (two) times daily. 08/29/23 09/08/23 Yes [provider]  acetaminophen  (TYLENOL ) 325 MG tablet Take 2 tablets (650 mg total) by mouth every 6 (six) hours as needed for mild pain (or Fever >/= 101). 05/21/20   Angiulli, Everlyn Hockey, PA-C  atorvastatin  (LIPITOR) 20 MG tablet Take 1 tablet (20 mg total) by mouth daily after supper. 05/21/20   Angiulli, Daniel J, PA-C  estradiol (ESTRACE) 0.1 MG/GM vaginal cream Place 1 Applicatorful vaginally 2 (two) times a week.    [provider]  hydrALAZINE  (APRESOLINE ) 25 MG tablet TAKE 1 TABLET (25 MG TOTAL) BY MOUTH AT BEDTIME. 05/22/20 11/24/22  Angiulli, Everlyn Hockey, PA-C  losartan  (COZAAR ) 25 MG tablet TAKE 3 TABLETS (75 MG TOTAL) BY MOUTH AT BEDTIME. 05/21/20 11/24/22  Angiulli, Everlyn Hockey, PA-C  metoprolol  succinate (TOPROL -XL) 25 MG 24 hr tablet Take 0.5 tablets (12.5 mg total) by mouth daily. 05/06/22   Terrance Ferretti, NP  Multiple Vitamins-Minerals (ONE-A-DAY  PROACTIVE 65+) TABS Take 1 tablet by mouth daily with breakfast.    [provider]  Multiple Vitamins-Minerals (PRESERVISION AREDS) CAPS Take 1 capsule by mouth 2 (two) times daily.    [provider]  pantoprazole  (PROTONIX ) 40 MG tablet Take 1 tablet (40 mg total) by mouth daily. 05/22/20   Angiulli, Everlyn Hockey, PA-C  Polyethyl Glyc-Propyl Glyc PF (SYSTANE ULTRA PF) 0.4-0.3 % SOLN Place 1 drop into both eyes at bedtime.    [provider]  Probiotic Product (VSL#3 PO) Take 1 capsule by mouth in the morning.    [provider]  Vitamin D3 (VITAMIN D ) 25 MCG tablet Take 1 tablet (1,000 Units total) by mouth daily. 05/21/20   Angiulli, Everlyn Hockey, PA-C  warfarin (COUMADIN ) 5 MG tablet Take 1 tablet (5 mg total) by mouth daily. 05/22/20 11/24/22  Angiulli, Everlyn Hockey, PA-C      Allergies    Pradaxa [dabigatran etexilate mesylate], Clindamycin/lincomycin, Latex, Ampicillin, and Sulfamethoxazole    Review of Systems   Review of Systems  Physical Exam Updated Vital Signs BP (!) 161/59 (BP Location: Right Arm)   Pulse (!) 50   Temp 98.1 F (36.7 C)   Resp 16   SpO2 99%  Physical Exam Vitals and nursing note reviewed.  Constitutional:      General: She is not in acute distress.    Appearance: She is well-developed.  HENT:     Head:  Normocephalic and atraumatic.     Right Ear: External ear normal.     Left Ear: External ear normal.  Eyes:     General: No scleral icterus.       Right eye: No discharge.        Left eye: No discharge.     Conjunctiva/sclera: Conjunctivae normal.  Neck:     Trachea: No tracheal deviation.  Cardiovascular:     Rate and Rhythm: Normal rate and regular rhythm.  Pulmonary:     Effort: Pulmonary effort is normal. No respiratory distress.     Breath sounds: Normal breath sounds. No stridor.  Abdominal:     General: There is no distension.  Musculoskeletal:        General: No swelling or deformity.     Cervical back: Neck  supple.     Right lower leg: Edema present.     Left lower leg: Edema present.     Comments: Patient has edema noted to both lower extremities, slight pitting noted with indentation with her socks.  Patient does have some tenderness to palpation bilaterally on exam, she does not have any significant erythema.  No induration.  Skin:    General: Skin is warm and dry.     Findings: No rash.  Neurological:     Mental Status: She is alert. Mental status is at baseline.     Cranial Nerves: No dysarthria or facial asymmetry.     Motor: No seizure activity.     ED Results / Procedures / Treatments   Labs (all labs ordered are listed, but only abnormal results are displayed) Labs Reviewed  COMPREHENSIVE METABOLIC PANEL WITH GFR - Abnormal; Notable for the following components:      Result Value   Glucose, Bld 101 (*)    BUN 34 (*)    Creatinine, Ser 1.27 (*)    Alkaline Phosphatase 143 (*)    GFR, Estimated 40 (*)    All other components within normal limits  CBC WITH DIFFERENTIAL/PLATELET - Abnormal; Notable for the following components:   RBC 3.64 (*)    Hemoglobin 10.4 (*)    HCT 33.1 (*)    All other components within normal limits    EKG None  Radiology No results found.  Procedures Procedures    Medications Ordered in ED Medications - No data to display  ED Course/ Medical Decision Making/ A&P Clinical Course as of 08/30/23 1827  Tue Aug 30, 2023  1805 CBC with Differential(!) [JK]  1805 HCT(!): 33.1 Similar to baseline  [JK]  1805 Comprehensive metabolic panel(!) Cr increased, similar to previous  [JK]    Clinical Course User Index [JK] Trish Furl, MD                                 Medical Decision Making Amount and/or Complexity of Data Reviewed Labs: ordered. Decision-making details documented in ED Course.   Patient does appear to have chronic lymphedema.  She does not have any signs of infection based on my exam.  No signs of cellulitis.  Patient  is on Coumadin  but DVT is a concern.  Ultrasound was performed.  Patient and her husband state they need to leave and go have dinner.  They are hungry.  I explained to him that I do not have the results back at this time.  They understand and will follow-up with her doctor.  I did explain  to him that I will not be able to call them with the results and I did recommend they stay because if it was positive she would be to have adjustment in her treatment.  Patient family understand.  They still decided to leave prior to completion of evaluation.        Final Clinical Impression(s) / ED Diagnoses Final diagnoses:  Peripheral edema    Rx / DC Orders ED Discharge Orders     None         Trish Furl, MD 08/30/23 1827

## 2023-08-30 NOTE — ED Notes (Addendum)
 Pt had difficulty getting up and transferring into wheel chair. Pt requested to stay in wheel chair for exam.

## 2023-08-30 NOTE — Discharge Instructions (Signed)
 Your blood test did not show any serious abnormalities.  The ultrasound result was not completed before you decided to leave.  Please follow-up with your doctor to have that rechecked.  Return to the emergency room fevers chills shortness of breath or other concerning symptoms

## 2023-09-02 DIAGNOSIS — I480 Paroxysmal atrial fibrillation: Secondary | ICD-10-CM | POA: Diagnosis not present

## 2023-09-02 DIAGNOSIS — F0393 Unspecified dementia, unspecified severity, with mood disturbance: Secondary | ICD-10-CM | POA: Diagnosis not present

## 2023-09-02 DIAGNOSIS — F32A Depression, unspecified: Secondary | ICD-10-CM | POA: Diagnosis not present

## 2023-09-02 DIAGNOSIS — G4733 Obstructive sleep apnea (adult) (pediatric): Secondary | ICD-10-CM | POA: Diagnosis not present

## 2023-09-02 DIAGNOSIS — I69354 Hemiplegia and hemiparesis following cerebral infarction affecting left non-dominant side: Secondary | ICD-10-CM | POA: Diagnosis not present

## 2023-09-02 DIAGNOSIS — F03918 Unspecified dementia, unspecified severity, with other behavioral disturbance: Secondary | ICD-10-CM | POA: Diagnosis not present

## 2023-09-05 DIAGNOSIS — R399 Unspecified symptoms and signs involving the genitourinary system: Secondary | ICD-10-CM | POA: Diagnosis not present

## 2023-09-06 DIAGNOSIS — I69354 Hemiplegia and hemiparesis following cerebral infarction affecting left non-dominant side: Secondary | ICD-10-CM | POA: Diagnosis not present

## 2023-09-06 DIAGNOSIS — Z7901 Long term (current) use of anticoagulants: Secondary | ICD-10-CM | POA: Diagnosis not present

## 2023-09-06 DIAGNOSIS — I480 Paroxysmal atrial fibrillation: Secondary | ICD-10-CM | POA: Diagnosis not present

## 2023-09-06 DIAGNOSIS — F03918 Unspecified dementia, unspecified severity, with other behavioral disturbance: Secondary | ICD-10-CM | POA: Diagnosis not present

## 2023-09-06 DIAGNOSIS — F32A Depression, unspecified: Secondary | ICD-10-CM | POA: Diagnosis not present

## 2023-09-06 DIAGNOSIS — I63541 Cerebral infarction due to unspecified occlusion or stenosis of right cerebellar artery: Secondary | ICD-10-CM | POA: Diagnosis not present

## 2023-09-06 DIAGNOSIS — G4733 Obstructive sleep apnea (adult) (pediatric): Secondary | ICD-10-CM | POA: Diagnosis not present

## 2023-09-06 DIAGNOSIS — F0393 Unspecified dementia, unspecified severity, with mood disturbance: Secondary | ICD-10-CM | POA: Diagnosis not present

## 2023-09-09 DIAGNOSIS — F03918 Unspecified dementia, unspecified severity, with other behavioral disturbance: Secondary | ICD-10-CM | POA: Diagnosis not present

## 2023-09-09 DIAGNOSIS — F32A Depression, unspecified: Secondary | ICD-10-CM | POA: Diagnosis not present

## 2023-09-09 DIAGNOSIS — F0393 Unspecified dementia, unspecified severity, with mood disturbance: Secondary | ICD-10-CM | POA: Diagnosis not present

## 2023-09-09 DIAGNOSIS — I480 Paroxysmal atrial fibrillation: Secondary | ICD-10-CM | POA: Diagnosis not present

## 2023-09-09 DIAGNOSIS — I69354 Hemiplegia and hemiparesis following cerebral infarction affecting left non-dominant side: Secondary | ICD-10-CM | POA: Diagnosis not present

## 2023-09-09 DIAGNOSIS — G4733 Obstructive sleep apnea (adult) (pediatric): Secondary | ICD-10-CM | POA: Diagnosis not present

## 2023-09-12 DIAGNOSIS — G4733 Obstructive sleep apnea (adult) (pediatric): Secondary | ICD-10-CM | POA: Diagnosis not present

## 2023-09-12 DIAGNOSIS — I480 Paroxysmal atrial fibrillation: Secondary | ICD-10-CM | POA: Diagnosis not present

## 2023-09-12 DIAGNOSIS — F32A Depression, unspecified: Secondary | ICD-10-CM | POA: Diagnosis not present

## 2023-09-12 DIAGNOSIS — I69354 Hemiplegia and hemiparesis following cerebral infarction affecting left non-dominant side: Secondary | ICD-10-CM | POA: Diagnosis not present

## 2023-09-12 DIAGNOSIS — F03918 Unspecified dementia, unspecified severity, with other behavioral disturbance: Secondary | ICD-10-CM | POA: Diagnosis not present

## 2023-09-12 DIAGNOSIS — F0393 Unspecified dementia, unspecified severity, with mood disturbance: Secondary | ICD-10-CM | POA: Diagnosis not present

## 2023-09-14 DIAGNOSIS — R399 Unspecified symptoms and signs involving the genitourinary system: Secondary | ICD-10-CM | POA: Diagnosis not present

## 2023-09-15 DIAGNOSIS — I480 Paroxysmal atrial fibrillation: Secondary | ICD-10-CM | POA: Diagnosis not present

## 2023-09-15 DIAGNOSIS — I872 Venous insufficiency (chronic) (peripheral): Secondary | ICD-10-CM | POA: Diagnosis not present

## 2023-09-15 DIAGNOSIS — K219 Gastro-esophageal reflux disease without esophagitis: Secondary | ICD-10-CM | POA: Diagnosis not present

## 2023-09-15 DIAGNOSIS — I129 Hypertensive chronic kidney disease with stage 1 through stage 4 chronic kidney disease, or unspecified chronic kidney disease: Secondary | ICD-10-CM | POA: Diagnosis not present

## 2023-09-15 DIAGNOSIS — I890 Lymphedema, not elsewhere classified: Secondary | ICD-10-CM | POA: Diagnosis not present

## 2023-09-15 DIAGNOSIS — Z9181 History of falling: Secondary | ICD-10-CM | POA: Diagnosis not present

## 2023-09-15 DIAGNOSIS — F03918 Unspecified dementia, unspecified severity, with other behavioral disturbance: Secondary | ICD-10-CM | POA: Diagnosis not present

## 2023-09-15 DIAGNOSIS — I69354 Hemiplegia and hemiparesis following cerebral infarction affecting left non-dominant side: Secondary | ICD-10-CM | POA: Diagnosis not present

## 2023-09-15 DIAGNOSIS — M545 Low back pain, unspecified: Secondary | ICD-10-CM | POA: Diagnosis not present

## 2023-09-15 DIAGNOSIS — N1832 Chronic kidney disease, stage 3b: Secondary | ICD-10-CM | POA: Diagnosis not present

## 2023-09-15 DIAGNOSIS — Z87440 Personal history of urinary (tract) infections: Secondary | ICD-10-CM | POA: Diagnosis not present

## 2023-09-15 DIAGNOSIS — D631 Anemia in chronic kidney disease: Secondary | ICD-10-CM | POA: Diagnosis not present

## 2023-09-15 DIAGNOSIS — F0393 Unspecified dementia, unspecified severity, with mood disturbance: Secondary | ICD-10-CM | POA: Diagnosis not present

## 2023-09-15 DIAGNOSIS — G8929 Other chronic pain: Secondary | ICD-10-CM | POA: Diagnosis not present

## 2023-09-15 DIAGNOSIS — Z853 Personal history of malignant neoplasm of breast: Secondary | ICD-10-CM | POA: Diagnosis not present

## 2023-09-15 DIAGNOSIS — I351 Nonrheumatic aortic (valve) insufficiency: Secondary | ICD-10-CM | POA: Diagnosis not present

## 2023-09-15 DIAGNOSIS — M8580 Other specified disorders of bone density and structure, unspecified site: Secondary | ICD-10-CM | POA: Diagnosis not present

## 2023-09-15 DIAGNOSIS — Z7901 Long term (current) use of anticoagulants: Secondary | ICD-10-CM | POA: Diagnosis not present

## 2023-09-15 DIAGNOSIS — G4733 Obstructive sleep apnea (adult) (pediatric): Secondary | ICD-10-CM | POA: Diagnosis not present

## 2023-09-15 DIAGNOSIS — F32A Depression, unspecified: Secondary | ICD-10-CM | POA: Diagnosis not present

## 2023-09-15 DIAGNOSIS — K580 Irritable bowel syndrome with diarrhea: Secondary | ICD-10-CM | POA: Diagnosis not present

## 2023-09-16 DIAGNOSIS — F32A Depression, unspecified: Secondary | ICD-10-CM | POA: Diagnosis not present

## 2023-09-16 DIAGNOSIS — F03918 Unspecified dementia, unspecified severity, with other behavioral disturbance: Secondary | ICD-10-CM | POA: Diagnosis not present

## 2023-09-16 DIAGNOSIS — I480 Paroxysmal atrial fibrillation: Secondary | ICD-10-CM | POA: Diagnosis not present

## 2023-09-16 DIAGNOSIS — I69354 Hemiplegia and hemiparesis following cerebral infarction affecting left non-dominant side: Secondary | ICD-10-CM | POA: Diagnosis not present

## 2023-09-16 DIAGNOSIS — F0393 Unspecified dementia, unspecified severity, with mood disturbance: Secondary | ICD-10-CM | POA: Diagnosis not present

## 2023-09-16 DIAGNOSIS — G4733 Obstructive sleep apnea (adult) (pediatric): Secondary | ICD-10-CM | POA: Diagnosis not present

## 2023-09-19 DIAGNOSIS — H353132 Nonexudative age-related macular degeneration, bilateral, intermediate dry stage: Secondary | ICD-10-CM | POA: Diagnosis not present

## 2023-09-19 DIAGNOSIS — Z8669 Personal history of other diseases of the nervous system and sense organs: Secondary | ICD-10-CM | POA: Diagnosis not present

## 2023-09-19 DIAGNOSIS — H02002 Unspecified entropion of right lower eyelid: Secondary | ICD-10-CM | POA: Diagnosis not present

## 2023-09-19 DIAGNOSIS — H26491 Other secondary cataract, right eye: Secondary | ICD-10-CM | POA: Diagnosis not present

## 2023-09-19 DIAGNOSIS — Z9889 Other specified postprocedural states: Secondary | ICD-10-CM | POA: Diagnosis not present

## 2023-09-19 DIAGNOSIS — H02403 Unspecified ptosis of bilateral eyelids: Secondary | ICD-10-CM | POA: Diagnosis not present

## 2023-09-19 DIAGNOSIS — Z961 Presence of intraocular lens: Secondary | ICD-10-CM | POA: Diagnosis not present

## 2023-09-19 DIAGNOSIS — H02052 Trichiasis without entropian right lower eyelid: Secondary | ICD-10-CM | POA: Diagnosis not present

## 2023-09-20 DIAGNOSIS — G4733 Obstructive sleep apnea (adult) (pediatric): Secondary | ICD-10-CM | POA: Diagnosis not present

## 2023-09-20 DIAGNOSIS — F03918 Unspecified dementia, unspecified severity, with other behavioral disturbance: Secondary | ICD-10-CM | POA: Diagnosis not present

## 2023-09-20 DIAGNOSIS — F0393 Unspecified dementia, unspecified severity, with mood disturbance: Secondary | ICD-10-CM | POA: Diagnosis not present

## 2023-09-20 DIAGNOSIS — F32A Depression, unspecified: Secondary | ICD-10-CM | POA: Diagnosis not present

## 2023-09-20 DIAGNOSIS — I480 Paroxysmal atrial fibrillation: Secondary | ICD-10-CM | POA: Diagnosis not present

## 2023-09-20 DIAGNOSIS — I69354 Hemiplegia and hemiparesis following cerebral infarction affecting left non-dominant side: Secondary | ICD-10-CM | POA: Diagnosis not present

## 2023-09-23 DIAGNOSIS — I480 Paroxysmal atrial fibrillation: Secondary | ICD-10-CM | POA: Diagnosis not present

## 2023-09-23 DIAGNOSIS — G4733 Obstructive sleep apnea (adult) (pediatric): Secondary | ICD-10-CM | POA: Diagnosis not present

## 2023-09-23 DIAGNOSIS — F0393 Unspecified dementia, unspecified severity, with mood disturbance: Secondary | ICD-10-CM | POA: Diagnosis not present

## 2023-09-23 DIAGNOSIS — I69354 Hemiplegia and hemiparesis following cerebral infarction affecting left non-dominant side: Secondary | ICD-10-CM | POA: Diagnosis not present

## 2023-09-23 DIAGNOSIS — F03918 Unspecified dementia, unspecified severity, with other behavioral disturbance: Secondary | ICD-10-CM | POA: Diagnosis not present

## 2023-09-23 DIAGNOSIS — F32A Depression, unspecified: Secondary | ICD-10-CM | POA: Diagnosis not present

## 2023-09-28 DIAGNOSIS — F0393 Unspecified dementia, unspecified severity, with mood disturbance: Secondary | ICD-10-CM | POA: Diagnosis not present

## 2023-09-28 DIAGNOSIS — F32A Depression, unspecified: Secondary | ICD-10-CM | POA: Diagnosis not present

## 2023-09-28 DIAGNOSIS — I480 Paroxysmal atrial fibrillation: Secondary | ICD-10-CM | POA: Diagnosis not present

## 2023-09-28 DIAGNOSIS — G4733 Obstructive sleep apnea (adult) (pediatric): Secondary | ICD-10-CM | POA: Diagnosis not present

## 2023-09-28 DIAGNOSIS — F03918 Unspecified dementia, unspecified severity, with other behavioral disturbance: Secondary | ICD-10-CM | POA: Diagnosis not present

## 2023-09-28 DIAGNOSIS — I69354 Hemiplegia and hemiparesis following cerebral infarction affecting left non-dominant side: Secondary | ICD-10-CM | POA: Diagnosis not present

## 2023-10-07 DIAGNOSIS — F32A Depression, unspecified: Secondary | ICD-10-CM | POA: Diagnosis not present

## 2023-10-07 DIAGNOSIS — F03918 Unspecified dementia, unspecified severity, with other behavioral disturbance: Secondary | ICD-10-CM | POA: Diagnosis not present

## 2023-10-07 DIAGNOSIS — I69354 Hemiplegia and hemiparesis following cerebral infarction affecting left non-dominant side: Secondary | ICD-10-CM | POA: Diagnosis not present

## 2023-10-07 DIAGNOSIS — I480 Paroxysmal atrial fibrillation: Secondary | ICD-10-CM | POA: Diagnosis not present

## 2023-10-07 DIAGNOSIS — G4733 Obstructive sleep apnea (adult) (pediatric): Secondary | ICD-10-CM | POA: Diagnosis not present

## 2023-10-07 DIAGNOSIS — F0393 Unspecified dementia, unspecified severity, with mood disturbance: Secondary | ICD-10-CM | POA: Diagnosis not present

## 2023-10-13 DIAGNOSIS — L814 Other melanin hyperpigmentation: Secondary | ICD-10-CM | POA: Diagnosis not present

## 2023-10-13 DIAGNOSIS — Z85828 Personal history of other malignant neoplasm of skin: Secondary | ICD-10-CM | POA: Diagnosis not present

## 2023-10-13 DIAGNOSIS — D1801 Hemangioma of skin and subcutaneous tissue: Secondary | ICD-10-CM | POA: Diagnosis not present

## 2023-10-13 DIAGNOSIS — L578 Other skin changes due to chronic exposure to nonionizing radiation: Secondary | ICD-10-CM | POA: Diagnosis not present

## 2023-10-13 DIAGNOSIS — L821 Other seborrheic keratosis: Secondary | ICD-10-CM | POA: Diagnosis not present

## 2023-10-13 DIAGNOSIS — D229 Melanocytic nevi, unspecified: Secondary | ICD-10-CM | POA: Diagnosis not present

## 2023-10-14 DIAGNOSIS — F32A Depression, unspecified: Secondary | ICD-10-CM | POA: Diagnosis not present

## 2023-10-14 DIAGNOSIS — F03918 Unspecified dementia, unspecified severity, with other behavioral disturbance: Secondary | ICD-10-CM | POA: Diagnosis not present

## 2023-10-14 DIAGNOSIS — G4733 Obstructive sleep apnea (adult) (pediatric): Secondary | ICD-10-CM | POA: Diagnosis not present

## 2023-10-14 DIAGNOSIS — F0393 Unspecified dementia, unspecified severity, with mood disturbance: Secondary | ICD-10-CM | POA: Diagnosis not present

## 2023-10-14 DIAGNOSIS — I480 Paroxysmal atrial fibrillation: Secondary | ICD-10-CM | POA: Diagnosis not present

## 2023-10-14 DIAGNOSIS — I69354 Hemiplegia and hemiparesis following cerebral infarction affecting left non-dominant side: Secondary | ICD-10-CM | POA: Diagnosis not present

## 2023-10-15 DIAGNOSIS — I351 Nonrheumatic aortic (valve) insufficiency: Secondary | ICD-10-CM | POA: Diagnosis not present

## 2023-10-15 DIAGNOSIS — N1832 Chronic kidney disease, stage 3b: Secondary | ICD-10-CM | POA: Diagnosis not present

## 2023-10-15 DIAGNOSIS — G4733 Obstructive sleep apnea (adult) (pediatric): Secondary | ICD-10-CM | POA: Diagnosis not present

## 2023-10-15 DIAGNOSIS — Z853 Personal history of malignant neoplasm of breast: Secondary | ICD-10-CM | POA: Diagnosis not present

## 2023-10-15 DIAGNOSIS — I872 Venous insufficiency (chronic) (peripheral): Secondary | ICD-10-CM | POA: Diagnosis not present

## 2023-10-15 DIAGNOSIS — M8580 Other specified disorders of bone density and structure, unspecified site: Secondary | ICD-10-CM | POA: Diagnosis not present

## 2023-10-15 DIAGNOSIS — M545 Low back pain, unspecified: Secondary | ICD-10-CM | POA: Diagnosis not present

## 2023-10-15 DIAGNOSIS — F03918 Unspecified dementia, unspecified severity, with other behavioral disturbance: Secondary | ICD-10-CM | POA: Diagnosis not present

## 2023-10-15 DIAGNOSIS — I69354 Hemiplegia and hemiparesis following cerebral infarction affecting left non-dominant side: Secondary | ICD-10-CM | POA: Diagnosis not present

## 2023-10-15 DIAGNOSIS — I480 Paroxysmal atrial fibrillation: Secondary | ICD-10-CM | POA: Diagnosis not present

## 2023-10-15 DIAGNOSIS — D631 Anemia in chronic kidney disease: Secondary | ICD-10-CM | POA: Diagnosis not present

## 2023-10-15 DIAGNOSIS — Z9181 History of falling: Secondary | ICD-10-CM | POA: Diagnosis not present

## 2023-10-15 DIAGNOSIS — I890 Lymphedema, not elsewhere classified: Secondary | ICD-10-CM | POA: Diagnosis not present

## 2023-10-15 DIAGNOSIS — G8929 Other chronic pain: Secondary | ICD-10-CM | POA: Diagnosis not present

## 2023-10-15 DIAGNOSIS — K219 Gastro-esophageal reflux disease without esophagitis: Secondary | ICD-10-CM | POA: Diagnosis not present

## 2023-10-15 DIAGNOSIS — I129 Hypertensive chronic kidney disease with stage 1 through stage 4 chronic kidney disease, or unspecified chronic kidney disease: Secondary | ICD-10-CM | POA: Diagnosis not present

## 2023-10-15 DIAGNOSIS — F0393 Unspecified dementia, unspecified severity, with mood disturbance: Secondary | ICD-10-CM | POA: Diagnosis not present

## 2023-10-15 DIAGNOSIS — Z87440 Personal history of urinary (tract) infections: Secondary | ICD-10-CM | POA: Diagnosis not present

## 2023-10-15 DIAGNOSIS — Z7901 Long term (current) use of anticoagulants: Secondary | ICD-10-CM | POA: Diagnosis not present

## 2023-10-15 DIAGNOSIS — K580 Irritable bowel syndrome with diarrhea: Secondary | ICD-10-CM | POA: Diagnosis not present

## 2023-10-15 DIAGNOSIS — F32A Depression, unspecified: Secondary | ICD-10-CM | POA: Diagnosis not present

## 2023-10-18 DIAGNOSIS — I63541 Cerebral infarction due to unspecified occlusion or stenosis of right cerebellar artery: Secondary | ICD-10-CM | POA: Diagnosis not present

## 2023-10-18 DIAGNOSIS — F0393 Unspecified dementia, unspecified severity, with mood disturbance: Secondary | ICD-10-CM | POA: Diagnosis not present

## 2023-10-18 DIAGNOSIS — I480 Paroxysmal atrial fibrillation: Secondary | ICD-10-CM | POA: Diagnosis not present

## 2023-10-18 DIAGNOSIS — D631 Anemia in chronic kidney disease: Secondary | ICD-10-CM | POA: Diagnosis not present

## 2023-10-18 DIAGNOSIS — N1832 Chronic kidney disease, stage 3b: Secondary | ICD-10-CM | POA: Diagnosis not present

## 2023-10-18 DIAGNOSIS — I69354 Hemiplegia and hemiparesis following cerebral infarction affecting left non-dominant side: Secondary | ICD-10-CM | POA: Diagnosis not present

## 2023-10-18 DIAGNOSIS — I129 Hypertensive chronic kidney disease with stage 1 through stage 4 chronic kidney disease, or unspecified chronic kidney disease: Secondary | ICD-10-CM | POA: Diagnosis not present

## 2023-10-18 DIAGNOSIS — Z7901 Long term (current) use of anticoagulants: Secondary | ICD-10-CM | POA: Diagnosis not present

## 2023-10-27 DIAGNOSIS — I872 Venous insufficiency (chronic) (peripheral): Secondary | ICD-10-CM | POA: Diagnosis not present

## 2023-10-27 DIAGNOSIS — N1832 Chronic kidney disease, stage 3b: Secondary | ICD-10-CM | POA: Diagnosis not present

## 2023-10-27 DIAGNOSIS — F0393 Unspecified dementia, unspecified severity, with mood disturbance: Secondary | ICD-10-CM | POA: Diagnosis not present

## 2023-10-27 DIAGNOSIS — F03918 Unspecified dementia, unspecified severity, with other behavioral disturbance: Secondary | ICD-10-CM | POA: Diagnosis not present

## 2023-10-27 DIAGNOSIS — I129 Hypertensive chronic kidney disease with stage 1 through stage 4 chronic kidney disease, or unspecified chronic kidney disease: Secondary | ICD-10-CM | POA: Diagnosis not present

## 2023-10-27 DIAGNOSIS — F32A Depression, unspecified: Secondary | ICD-10-CM | POA: Diagnosis not present

## 2023-10-27 DIAGNOSIS — I69354 Hemiplegia and hemiparesis following cerebral infarction affecting left non-dominant side: Secondary | ICD-10-CM | POA: Diagnosis not present

## 2023-10-27 DIAGNOSIS — G8929 Other chronic pain: Secondary | ICD-10-CM | POA: Diagnosis not present

## 2023-10-27 DIAGNOSIS — D631 Anemia in chronic kidney disease: Secondary | ICD-10-CM | POA: Diagnosis not present

## 2023-10-27 DIAGNOSIS — M545 Low back pain, unspecified: Secondary | ICD-10-CM | POA: Diagnosis not present

## 2023-10-27 DIAGNOSIS — I480 Paroxysmal atrial fibrillation: Secondary | ICD-10-CM | POA: Diagnosis not present

## 2023-10-27 DIAGNOSIS — G4733 Obstructive sleep apnea (adult) (pediatric): Secondary | ICD-10-CM | POA: Diagnosis not present

## 2023-10-28 DIAGNOSIS — I480 Paroxysmal atrial fibrillation: Secondary | ICD-10-CM | POA: Diagnosis not present

## 2023-10-28 DIAGNOSIS — I69354 Hemiplegia and hemiparesis following cerebral infarction affecting left non-dominant side: Secondary | ICD-10-CM | POA: Diagnosis not present

## 2023-10-28 DIAGNOSIS — D631 Anemia in chronic kidney disease: Secondary | ICD-10-CM | POA: Diagnosis not present

## 2023-10-28 DIAGNOSIS — I129 Hypertensive chronic kidney disease with stage 1 through stage 4 chronic kidney disease, or unspecified chronic kidney disease: Secondary | ICD-10-CM | POA: Diagnosis not present

## 2023-10-28 DIAGNOSIS — F0393 Unspecified dementia, unspecified severity, with mood disturbance: Secondary | ICD-10-CM | POA: Diagnosis not present

## 2023-10-28 DIAGNOSIS — N1832 Chronic kidney disease, stage 3b: Secondary | ICD-10-CM | POA: Diagnosis not present

## 2023-11-02 DIAGNOSIS — I129 Hypertensive chronic kidney disease with stage 1 through stage 4 chronic kidney disease, or unspecified chronic kidney disease: Secondary | ICD-10-CM | POA: Diagnosis not present

## 2023-11-02 DIAGNOSIS — I69354 Hemiplegia and hemiparesis following cerebral infarction affecting left non-dominant side: Secondary | ICD-10-CM | POA: Diagnosis not present

## 2023-11-02 DIAGNOSIS — F0393 Unspecified dementia, unspecified severity, with mood disturbance: Secondary | ICD-10-CM | POA: Diagnosis not present

## 2023-11-02 DIAGNOSIS — N1832 Chronic kidney disease, stage 3b: Secondary | ICD-10-CM | POA: Diagnosis not present

## 2023-11-02 DIAGNOSIS — D631 Anemia in chronic kidney disease: Secondary | ICD-10-CM | POA: Diagnosis not present

## 2023-11-02 DIAGNOSIS — I480 Paroxysmal atrial fibrillation: Secondary | ICD-10-CM | POA: Diagnosis not present

## 2023-11-03 DIAGNOSIS — R399 Unspecified symptoms and signs involving the genitourinary system: Secondary | ICD-10-CM | POA: Diagnosis not present

## 2023-11-08 DIAGNOSIS — F0393 Unspecified dementia, unspecified severity, with mood disturbance: Secondary | ICD-10-CM | POA: Diagnosis not present

## 2023-11-08 DIAGNOSIS — I129 Hypertensive chronic kidney disease with stage 1 through stage 4 chronic kidney disease, or unspecified chronic kidney disease: Secondary | ICD-10-CM | POA: Diagnosis not present

## 2023-11-08 DIAGNOSIS — N1832 Chronic kidney disease, stage 3b: Secondary | ICD-10-CM | POA: Diagnosis not present

## 2023-11-08 DIAGNOSIS — I480 Paroxysmal atrial fibrillation: Secondary | ICD-10-CM | POA: Diagnosis not present

## 2023-11-08 DIAGNOSIS — D631 Anemia in chronic kidney disease: Secondary | ICD-10-CM | POA: Diagnosis not present

## 2023-11-08 DIAGNOSIS — I69354 Hemiplegia and hemiparesis following cerebral infarction affecting left non-dominant side: Secondary | ICD-10-CM | POA: Diagnosis not present

## 2023-11-14 DIAGNOSIS — F03918 Unspecified dementia, unspecified severity, with other behavioral disturbance: Secondary | ICD-10-CM | POA: Diagnosis not present

## 2023-11-14 DIAGNOSIS — K219 Gastro-esophageal reflux disease without esophagitis: Secondary | ICD-10-CM | POA: Diagnosis not present

## 2023-11-14 DIAGNOSIS — F32A Depression, unspecified: Secondary | ICD-10-CM | POA: Diagnosis not present

## 2023-11-14 DIAGNOSIS — N1832 Chronic kidney disease, stage 3b: Secondary | ICD-10-CM | POA: Diagnosis not present

## 2023-11-14 DIAGNOSIS — D631 Anemia in chronic kidney disease: Secondary | ICD-10-CM | POA: Diagnosis not present

## 2023-11-14 DIAGNOSIS — I890 Lymphedema, not elsewhere classified: Secondary | ICD-10-CM | POA: Diagnosis not present

## 2023-11-14 DIAGNOSIS — Z9181 History of falling: Secondary | ICD-10-CM | POA: Diagnosis not present

## 2023-11-14 DIAGNOSIS — I129 Hypertensive chronic kidney disease with stage 1 through stage 4 chronic kidney disease, or unspecified chronic kidney disease: Secondary | ICD-10-CM | POA: Diagnosis not present

## 2023-11-14 DIAGNOSIS — G8929 Other chronic pain: Secondary | ICD-10-CM | POA: Diagnosis not present

## 2023-11-14 DIAGNOSIS — I480 Paroxysmal atrial fibrillation: Secondary | ICD-10-CM | POA: Diagnosis not present

## 2023-11-14 DIAGNOSIS — F0393 Unspecified dementia, unspecified severity, with mood disturbance: Secondary | ICD-10-CM | POA: Diagnosis not present

## 2023-11-14 DIAGNOSIS — G4733 Obstructive sleep apnea (adult) (pediatric): Secondary | ICD-10-CM | POA: Diagnosis not present

## 2023-11-14 DIAGNOSIS — I69354 Hemiplegia and hemiparesis following cerebral infarction affecting left non-dominant side: Secondary | ICD-10-CM | POA: Diagnosis not present

## 2023-11-14 DIAGNOSIS — M8580 Other specified disorders of bone density and structure, unspecified site: Secondary | ICD-10-CM | POA: Diagnosis not present

## 2023-11-14 DIAGNOSIS — I872 Venous insufficiency (chronic) (peripheral): Secondary | ICD-10-CM | POA: Diagnosis not present

## 2023-11-14 DIAGNOSIS — Z853 Personal history of malignant neoplasm of breast: Secondary | ICD-10-CM | POA: Diagnosis not present

## 2023-11-14 DIAGNOSIS — K580 Irritable bowel syndrome with diarrhea: Secondary | ICD-10-CM | POA: Diagnosis not present

## 2023-11-14 DIAGNOSIS — M545 Low back pain, unspecified: Secondary | ICD-10-CM | POA: Diagnosis not present

## 2023-11-14 DIAGNOSIS — I351 Nonrheumatic aortic (valve) insufficiency: Secondary | ICD-10-CM | POA: Diagnosis not present

## 2023-11-14 DIAGNOSIS — Z87440 Personal history of urinary (tract) infections: Secondary | ICD-10-CM | POA: Diagnosis not present

## 2023-11-14 DIAGNOSIS — Z7901 Long term (current) use of anticoagulants: Secondary | ICD-10-CM | POA: Diagnosis not present

## 2023-11-16 DIAGNOSIS — I63541 Cerebral infarction due to unspecified occlusion or stenosis of right cerebellar artery: Secondary | ICD-10-CM | POA: Diagnosis not present

## 2023-11-16 DIAGNOSIS — Z7901 Long term (current) use of anticoagulants: Secondary | ICD-10-CM | POA: Diagnosis not present

## 2023-11-16 DIAGNOSIS — I480 Paroxysmal atrial fibrillation: Secondary | ICD-10-CM | POA: Diagnosis not present

## 2023-11-21 DIAGNOSIS — F0393 Unspecified dementia, unspecified severity, with mood disturbance: Secondary | ICD-10-CM | POA: Diagnosis not present

## 2023-11-21 DIAGNOSIS — N1832 Chronic kidney disease, stage 3b: Secondary | ICD-10-CM | POA: Diagnosis not present

## 2023-11-21 DIAGNOSIS — D631 Anemia in chronic kidney disease: Secondary | ICD-10-CM | POA: Diagnosis not present

## 2023-11-21 DIAGNOSIS — I480 Paroxysmal atrial fibrillation: Secondary | ICD-10-CM | POA: Diagnosis not present

## 2023-11-21 DIAGNOSIS — I69354 Hemiplegia and hemiparesis following cerebral infarction affecting left non-dominant side: Secondary | ICD-10-CM | POA: Diagnosis not present

## 2023-11-21 DIAGNOSIS — I129 Hypertensive chronic kidney disease with stage 1 through stage 4 chronic kidney disease, or unspecified chronic kidney disease: Secondary | ICD-10-CM | POA: Diagnosis not present

## 2023-11-23 DIAGNOSIS — R399 Unspecified symptoms and signs involving the genitourinary system: Secondary | ICD-10-CM | POA: Diagnosis not present

## 2023-12-02 DIAGNOSIS — I69354 Hemiplegia and hemiparesis following cerebral infarction affecting left non-dominant side: Secondary | ICD-10-CM | POA: Diagnosis not present

## 2023-12-02 DIAGNOSIS — F0393 Unspecified dementia, unspecified severity, with mood disturbance: Secondary | ICD-10-CM | POA: Diagnosis not present

## 2023-12-02 DIAGNOSIS — D631 Anemia in chronic kidney disease: Secondary | ICD-10-CM | POA: Diagnosis not present

## 2023-12-02 DIAGNOSIS — I480 Paroxysmal atrial fibrillation: Secondary | ICD-10-CM | POA: Diagnosis not present

## 2023-12-02 DIAGNOSIS — I129 Hypertensive chronic kidney disease with stage 1 through stage 4 chronic kidney disease, or unspecified chronic kidney disease: Secondary | ICD-10-CM | POA: Diagnosis not present

## 2023-12-02 DIAGNOSIS — N1832 Chronic kidney disease, stage 3b: Secondary | ICD-10-CM | POA: Diagnosis not present

## 2023-12-06 DIAGNOSIS — F0393 Unspecified dementia, unspecified severity, with mood disturbance: Secondary | ICD-10-CM | POA: Diagnosis not present

## 2023-12-06 DIAGNOSIS — N1832 Chronic kidney disease, stage 3b: Secondary | ICD-10-CM | POA: Diagnosis not present

## 2023-12-06 DIAGNOSIS — I129 Hypertensive chronic kidney disease with stage 1 through stage 4 chronic kidney disease, or unspecified chronic kidney disease: Secondary | ICD-10-CM | POA: Diagnosis not present

## 2023-12-06 DIAGNOSIS — I69354 Hemiplegia and hemiparesis following cerebral infarction affecting left non-dominant side: Secondary | ICD-10-CM | POA: Diagnosis not present

## 2023-12-06 DIAGNOSIS — D631 Anemia in chronic kidney disease: Secondary | ICD-10-CM | POA: Diagnosis not present

## 2023-12-06 DIAGNOSIS — I480 Paroxysmal atrial fibrillation: Secondary | ICD-10-CM | POA: Diagnosis not present

## 2023-12-20 DIAGNOSIS — I63541 Cerebral infarction due to unspecified occlusion or stenosis of right cerebellar artery: Secondary | ICD-10-CM | POA: Diagnosis not present

## 2023-12-20 DIAGNOSIS — Z7901 Long term (current) use of anticoagulants: Secondary | ICD-10-CM | POA: Diagnosis not present

## 2023-12-20 DIAGNOSIS — I480 Paroxysmal atrial fibrillation: Secondary | ICD-10-CM | POA: Diagnosis not present

## 2023-12-22 DIAGNOSIS — R399 Unspecified symptoms and signs involving the genitourinary system: Secondary | ICD-10-CM | POA: Diagnosis not present

## 2023-12-26 DIAGNOSIS — N2889 Other specified disorders of kidney and ureter: Secondary | ICD-10-CM | POA: Diagnosis not present

## 2023-12-26 DIAGNOSIS — N390 Urinary tract infection, site not specified: Secondary | ICD-10-CM | POA: Diagnosis not present

## 2024-01-02 DIAGNOSIS — R339 Retention of urine, unspecified: Secondary | ICD-10-CM | POA: Diagnosis not present

## 2024-01-02 DIAGNOSIS — N3020 Other chronic cystitis without hematuria: Secondary | ICD-10-CM | POA: Diagnosis not present

## 2024-01-18 DIAGNOSIS — I63541 Cerebral infarction due to unspecified occlusion or stenosis of right cerebellar artery: Secondary | ICD-10-CM | POA: Diagnosis not present

## 2024-01-18 DIAGNOSIS — I480 Paroxysmal atrial fibrillation: Secondary | ICD-10-CM | POA: Diagnosis not present

## 2024-01-18 DIAGNOSIS — Z7901 Long term (current) use of anticoagulants: Secondary | ICD-10-CM | POA: Diagnosis not present

## 2024-01-23 DIAGNOSIS — R399 Unspecified symptoms and signs involving the genitourinary system: Secondary | ICD-10-CM | POA: Diagnosis not present

## 2024-01-30 DIAGNOSIS — D649 Anemia, unspecified: Secondary | ICD-10-CM | POA: Diagnosis not present

## 2024-01-30 DIAGNOSIS — N390 Urinary tract infection, site not specified: Secondary | ICD-10-CM | POA: Diagnosis not present

## 2024-01-30 DIAGNOSIS — M810 Age-related osteoporosis without current pathological fracture: Secondary | ICD-10-CM | POA: Diagnosis not present

## 2024-01-30 DIAGNOSIS — N1832 Chronic kidney disease, stage 3b: Secondary | ICD-10-CM | POA: Diagnosis not present

## 2024-01-30 DIAGNOSIS — R7989 Other specified abnormal findings of blood chemistry: Secondary | ICD-10-CM | POA: Diagnosis not present

## 2024-01-30 DIAGNOSIS — E785 Hyperlipidemia, unspecified: Secondary | ICD-10-CM | POA: Diagnosis not present

## 2024-01-30 DIAGNOSIS — R9389 Abnormal findings on diagnostic imaging of other specified body structures: Secondary | ICD-10-CM | POA: Diagnosis not present

## 2024-01-30 DIAGNOSIS — I10 Essential (primary) hypertension: Secondary | ICD-10-CM | POA: Diagnosis not present

## 2024-01-30 NOTE — Progress Notes (Signed)
 Todays visit was completed via a real-time telehealth (see specific modality noted below). The patient/authorized person provided oral consent at the time of the visit to engage in a telemedicine encounter with the present provider at [facility]. The patient/authorized person was informed of the potential benefits, limitations, and risks of telemedicine. The patient/authorized person expressed understanding that the laws that protect confidentiality also apply to telemedicine. The patient/authorized person acknowledged understanding that telemedicine does not provide emergency services and that he or she would need to call 911 or proceed to the nearest hospital for help if such a need arose.   Total time spent in the clinical discussion 6 minutes Telehealth Modality: Phone visit (audio only)     Chief Complaint: This patient is seen in follow up   Fall Screening     History of Present Illness: [name] is a 89 y.o. female with chronic cystitis, diarrhea, poor antibiotic tolerance, nightly CIC. She started on keflex  prophylaxis, continued crandberry, D mannose.  Last seen 01/02/2024; see that note for extensive history and culture history. Plan as below: Ct with no obvious source for GU infection.   Please follow up with PCP to determine if they would like to do anything about the area noted on your spleen and some lymph nodes seen on CT.   Extensive discussion with Pt and her husband today. We discussed low-dose suppression. We discussed the likelihood she is colonized and only treating for SYSTEMIC symptoms of infection and reviewed this at length.   She tolerates multiple antibiotics poorly and has diarrhea regularly from dietary factors - we've discussed monitoring for dietary factors and her baseline vs antibiotic intolerance.   Long discussion with patient and husband today. Plan as below. I spent over 40 minutes reviewing her culture data and course with the  patient and her husband   01/30/2024 She started omnicef 01/27/2024 Feeling better  CT STONE SEARCH WO CONTRAST, 12/26/2023 2:10 PM   INDICATION: stones, recurrent UTI - positive culture every 11-14 days various organisms for years, Urinary tract infection, site not specified \ N39.0 Urinary tract infection, site not specified \ N28.1 Cyst of kidney, acquired  stones ADDITIONAL HISTORY: None. COMPARISON: None.   TECHNIQUE: CT images of the abdomen and pelvis were obtained without the use of intravenous contrast. Conventional axial reconstructions and multiplanar reformatted images were submitted for review.    LIMITATIONS: Evaluation of various soft tissue structures as well as the vasculature is limited without the availability of intravenous contrast. Parameters utilized in some study protocols to mitigate patient radiation exposure can also reduce sensitivity and specificity of assessment.   FINDINGS:  .  MSK:        Mild degenerative and old pelvic fracture changes.  Prior proximal left femur ORIF. Trace L4-L5 anterolisthesis. No aggressive osseous, or suspicious soft tissue lesion.   SABRA  VASCULAR:     Moderate atherosclerotic calcifications.  Normal caliber aorta, major branches, and major visceral veins.   INCLUDED THORAX: .  Mediastinum:     Within normal limits for age.  SABRA  Heart/vessel:     Prominent cardiac chambers. Prominent aortic and mitral mitral annular and coronary calcifications No pericardial effusion or calcifications. .  Lungs, Pleura:     Basal scarring.      ABDOMEN and PELVIS:  .  Liver, Spleen:     No splenic lesions seen. 22 mm likely benign hepatic cyst (series 8 image 193). 10 mm segment 3 indeterminate hypodensity (series 8 image  125). 15 mm segment 1 probable benign cyst (series 8 image 121).   .  Pancreas, Gallbladder/biliary:   Cholelithiasis. No biliary or pancreatic duct dilation seen. No pancreatic mass or peripancreatic edema.   .   Adrenals:     Within normal limits. .  Kidneys, Ureters:  Within normal limits. No hydroureteronephrosis or urolithiasis.   33 x 58 mm lobulated hypodensity (series 8 image 178) abutting the dorsal left kidney, and caudal margin of the spleen. No adjacent mesenteric stranding.   .  GI tract, Peritoneum, Mesentery and Extraperitoneum:  Fluid-filled nondilated stomach and small bowel. Small volume stool in the colon. Colonic diverticulosis. No pathologic bowel dilation or wall thickening. Slightly large 13 mm portacaval node (series 8 image 169). Additional subcentimeter nodes including 9 mm left periaortic (series 8 image 2 image 70). No mesenteric edema, ascites or pneumoperitoneum.   .  Bladder:       Air in the bladder, question instrumentation. No bladder mass, intravesical calculus or perivesical stranding seen. .  Reproductive:     Atrophic uterus.   IMPRESSION: CONCLUSION:  1. Left perisplenic or perirenal lobulated low-density mass, possibly cyst, indeterminate on this exam.   2. Single large portacaval node, additional subcentimeter nodes. 3. Single indeterminate hepatic hypodensity, additional hepatic cysts.   If able, consider post contrast CT, and/or pre and postcontrast MRI evaluation Please see dictation for additional details.  Current Medications[1]   Allergies[2]  Medical History[3]   Surgical History[4]   Family History[5]       There were no vitals filed for this visit.  There is no height or weight on file to calculate BMI.  ROS  See Assessment and HPI.  Genitourinary:  As per HPI.  Physical Exam  Physical Exam No distress in voice  Assesment :     Discussed the following plan:  1. Abnormal CT scan      2. Recurrent UTI        Advised the following  Will fax CT results to [facility] medical regarding nodes and splenic findings to determine if PCP would like to refer to general surgery or alternate provider for further  evaluation, or just observe; we had discussed this at the last office visit as well.  Will continue Dmannose; continue omnicef. Declines starting keflex  prophylaxis with concern for loose stools which are regular for her at baseline also No orders of the defined types were placed in this encounter.   There are no Patient Instructions on file for this visit.  Return for follow up 6 months recurrent UTI [facility]   Electronically signed by: Seena Maranda Mail III, PA-C 01/30/2024 4:22 PM       [1]  Current Outpatient Medications:    atorvastatin  (LIPITOR) 20 mg tablet, Take 20 mg by mouth Once Daily., Disp: , Rfl:    cefdinir (OMNICEF) 300 mg capsule, Take 1 capsule (300 mg total) by mouth 2 (two) times a day for 7 days., Disp: 14 capsule, Rfl: 0   chlorthalidone  (HYGROTON ) 25 mg tablet, Take 25 mg by mouth daily., Disp: , Rfl:    d-mannose 500 mg cap, Take 2 capsules (1,000 mg total) by mouth 2 (two) times a day., Disp: , Rfl:    diphenhydrAMINE -acetaminophen  (Tylenol  PM Extra Strength) 25-500 mg tablet, Take 2 tablets by mouth at bedtime., Disp: , Rfl:    estradioL (ESTRACE) 0.01 % (0.1 mg/gram) vaginal cream, APPLY A BLUEBERRY SIZE AMOUNT ON FINGER INSIDE VAGINA USE EVERY OTHER NIGHT AT BEDTIME (Patient  taking differently: No sig reported), Disp: 42.5 g, Rfl: 3   Lactobacillus (Visbiome) 112.5 billion cell capsule, Take 1 capsule by mouth daily as needed., Disp: , Rfl:    losartan  (COZAAR ) 100 mg tablet, Take 100 mg by mouth Once Daily., Disp: , Rfl:    metoprolol  succinate (TOPROL  XL) 25 mg 24 hr tablet, Take 12.5 mg by mouth Once Daily., Disp: , Rfl:    pantoprazole  (PROTONIX ) 40 mg EC tablet, Take 40 mg by mouth Once Daily., Disp: , Rfl:    peg 400-propylene glycol (Systane, propylene glycoL,) 0.4-0.3 % drop ophthalmic solution, Administer 1 drop into each eyes at bedtime., Disp: , Rfl:    vit C-E-cupric-zinc -lutein (PreserVision Lutein) 226-90-0.8-5 mg capsule,  Take 1 capsule by mouth daily., Disp: , Rfl:    warfarin (COUMADIN ) 5 mg tablet, Take 2.5 mg by mouth at noon. 2.5mg  po daily x 5 days/week and 5mg  po x 2 days weekly ( Tues & Fri)., Disp: , Rfl:  [2] Allergies Allergen Reactions   Erythromycin Other (See Comments)    Other Reaction: GI Upset   Penicillins Other (See Comments)    Other Reaction: Diarrhea(IBS, W/ C- DIFF)   Sulfamethoxazole Diarrhea and Dizziness   Clindamycin Other (See Comments)    PT STATES HER DOCTOR TOLD HER NOT TO TAKE CLINDAMYCIN BECAUSE SHE GOT C-DIFF AFTER TAKING AMPICILLIN- tolerated in 2022 (per spouse)   Latex Rash and Other (See Comments)    Blisters   Dabigatran Etexilate Other (See Comments)    INTERNAL BLEEDING   Dabigatran Etexilate Mesylate Other (See Comments)    INTERNAL BLEEDING   Doxycycline Diarrhea   Ibandronate Other (See Comments)   Lincomycin Other (See Comments)    PT STATES HER DOCTOR TOLD HER NOT TO TAKE CLINDAMYCIN BECAUSE SHE GOT C-DIFF AFTER TAKING AMPICILLIN   Nsaids (Non-Steroidal Anti-Inflammatory Drug) GI Intolerance   Ciprofloxacin  Rash  [3] Past Medical History: Diagnosis Date   Arthritis    Atrial fibrillation    (CMD)    Breast CA    (CMD)    Burning with urination    Chronic cystitis    Hypertension    Macular degeneration    Mastoiditis of right side    Nocturia    Posterior capsular opacification 04/22/2011   Retinal detachment    Urinary frequency   [4] Past Surgical History: Procedure Laterality Date   CATARACT EXTRACTION W/  INTRAOCULAR LENS IMPLANT Bilateral    Procedure: CATARACT EXTRACTION W/  INTRAOCULAR LENS IMPLANT   COCHLEAR IMPLANT REVISION Left 08/04/2022   Left robotic assisted MedEL Synchrony 2 [id number] + Pin with Flex 28 electrode cochlear implant performed by Camellia Layman Milliner, MD at [facility] OR   DACROCYSTORHINOSTOMY Right 02/18/2016   Procedure: DACRYOCYSTORHINOSTOMY;  Surgeon: Geofm Macario Riding, MD PhD;  Location: [facility]  OUTPATIENT OR;  Service: Ophthalmology;  Laterality: Right;   stop blood thinners    HAVE S2 DRILL AVAILABLE    MASTECTOMY Bilateral    Procedure: MASTECTOMY BILATERAL   MASTOIDECTOMY     Procedure: MASTOIDECTOMY; times two, ages 55 and 25   OTHER SURGICAL HISTORY     Procedure: OTHER SURGICAL HISTORY (Eyelid surgery O.U.)   RETINAL DETACHMENT SURGERY     Procedure: RETINAL DETACHMENT SURGERY   TOTAL KNEE ARTHROPLASTY Bilateral    Procedure: REPLACEMENT TOTAL KNEE  [5] Family History Problem Relation Name Age of Onset   Glaucoma Mother     Stroke Mother     Hypertension Mother     Glaucoma  Father     Stroke Father     Hypertension Father     Macular degeneration Neg Hx     Retinal detachment Neg Hx     Strabismus Neg Hx     Amblyopia Neg Hx     Blindness Neg Hx     Anesthesia problems Neg Hx

## 2024-02-06 DIAGNOSIS — N319 Neuromuscular dysfunction of bladder, unspecified: Secondary | ICD-10-CM | POA: Diagnosis not present

## 2024-02-06 DIAGNOSIS — I480 Paroxysmal atrial fibrillation: Secondary | ICD-10-CM | POA: Diagnosis not present

## 2024-02-06 DIAGNOSIS — Z1339 Encounter for screening examination for other mental health and behavioral disorders: Secondary | ICD-10-CM | POA: Diagnosis not present

## 2024-02-06 DIAGNOSIS — D6859 Other primary thrombophilia: Secondary | ICD-10-CM | POA: Diagnosis not present

## 2024-02-06 DIAGNOSIS — G4733 Obstructive sleep apnea (adult) (pediatric): Secondary | ICD-10-CM | POA: Diagnosis not present

## 2024-02-06 DIAGNOSIS — I10 Essential (primary) hypertension: Secondary | ICD-10-CM | POA: Diagnosis not present

## 2024-02-06 DIAGNOSIS — E785 Hyperlipidemia, unspecified: Secondary | ICD-10-CM | POA: Diagnosis not present

## 2024-02-06 DIAGNOSIS — Z0000 Encounter for general adult medical examination without abnormal findings: Secondary | ICD-10-CM | POA: Diagnosis not present

## 2024-02-06 DIAGNOSIS — R82998 Other abnormal findings in urine: Secondary | ICD-10-CM | POA: Diagnosis not present

## 2024-02-06 DIAGNOSIS — D649 Anemia, unspecified: Secondary | ICD-10-CM | POA: Diagnosis not present

## 2024-02-06 DIAGNOSIS — N1832 Chronic kidney disease, stage 3b: Secondary | ICD-10-CM | POA: Diagnosis not present

## 2024-02-06 DIAGNOSIS — I69354 Hemiplegia and hemiparesis following cerebral infarction affecting left non-dominant side: Secondary | ICD-10-CM | POA: Diagnosis not present

## 2024-02-06 DIAGNOSIS — Z1331 Encounter for screening for depression: Secondary | ICD-10-CM | POA: Diagnosis not present

## 2024-03-01 DIAGNOSIS — Z7901 Long term (current) use of anticoagulants: Secondary | ICD-10-CM | POA: Diagnosis not present

## 2024-03-01 DIAGNOSIS — Z45321 Encounter for adjustment and management of cochlear device: Secondary | ICD-10-CM | POA: Diagnosis not present

## 2024-03-01 DIAGNOSIS — G510 Bell's palsy: Secondary | ICD-10-CM | POA: Diagnosis not present

## 2024-03-01 DIAGNOSIS — Z91040 Latex allergy status: Secondary | ICD-10-CM | POA: Diagnosis not present

## 2024-03-01 DIAGNOSIS — Z961 Presence of intraocular lens: Secondary | ICD-10-CM | POA: Diagnosis not present

## 2024-03-01 DIAGNOSIS — Z880 Allergy status to penicillin: Secondary | ICD-10-CM | POA: Diagnosis not present

## 2024-03-01 DIAGNOSIS — Z822 Family history of deafness and hearing loss: Secondary | ICD-10-CM | POA: Diagnosis not present

## 2024-03-01 DIAGNOSIS — R54 Age-related physical debility: Secondary | ICD-10-CM | POA: Diagnosis not present

## 2024-03-01 DIAGNOSIS — Z881 Allergy status to other antibiotic agents status: Secondary | ICD-10-CM | POA: Diagnosis not present

## 2024-03-01 DIAGNOSIS — Z79899 Other long term (current) drug therapy: Secondary | ICD-10-CM | POA: Diagnosis not present

## 2024-03-01 DIAGNOSIS — Z9621 Cochlear implant status: Secondary | ICD-10-CM | POA: Diagnosis not present

## 2024-03-01 DIAGNOSIS — Z4889 Encounter for other specified surgical aftercare: Secondary | ICD-10-CM | POA: Diagnosis not present

## 2024-03-01 DIAGNOSIS — H90A22 Sensorineural hearing loss, unilateral, left ear, with restricted hearing on the contralateral side: Secondary | ICD-10-CM | POA: Diagnosis not present

## 2024-03-01 DIAGNOSIS — Z886 Allergy status to analgesic agent status: Secondary | ICD-10-CM | POA: Diagnosis not present

## 2024-03-05 DIAGNOSIS — R399 Unspecified symptoms and signs involving the genitourinary system: Secondary | ICD-10-CM | POA: Diagnosis not present

## 2024-03-08 ENCOUNTER — Inpatient Hospital Stay (HOSPITAL_COMMUNITY)
Admission: EM | Admit: 2024-03-08 | Discharge: 2024-03-12 | DRG: 155 | Disposition: A | Discharge status: Home Health | Source: Ambulatory Visit | Attending: Family Medicine | Admitting: Family Medicine

## 2024-03-08 ENCOUNTER — Emergency Department (HOSPITAL_COMMUNITY)

## 2024-03-08 ENCOUNTER — Other Ambulatory Visit: Payer: Self-pay

## 2024-03-08 DIAGNOSIS — I110 Hypertensive heart disease with heart failure: Secondary | ICD-10-CM | POA: Diagnosis not present

## 2024-03-08 DIAGNOSIS — Y838 Other surgical procedures as the cause of abnormal reaction of the patient, or of later complication, without mention of misadventure at the time of the procedure: Secondary | ICD-10-CM | POA: Diagnosis present

## 2024-03-08 DIAGNOSIS — N179 Acute kidney failure, unspecified: Secondary | ICD-10-CM

## 2024-03-08 DIAGNOSIS — G9782 Other postprocedural complications and disorders of nervous system: Secondary | ICD-10-CM | POA: Diagnosis present

## 2024-03-08 DIAGNOSIS — Z8249 Family history of ischemic heart disease and other diseases of the circulatory system: Secondary | ICD-10-CM

## 2024-03-08 DIAGNOSIS — I509 Heart failure, unspecified: Secondary | ICD-10-CM | POA: Diagnosis not present

## 2024-03-08 DIAGNOSIS — N3000 Acute cystitis without hematuria: Secondary | ICD-10-CM | POA: Diagnosis not present

## 2024-03-08 DIAGNOSIS — Z8673 Personal history of transient ischemic attack (TIA), and cerebral infarction without residual deficits: Secondary | ICD-10-CM

## 2024-03-08 DIAGNOSIS — Z853 Personal history of malignant neoplasm of breast: Secondary | ICD-10-CM

## 2024-03-08 DIAGNOSIS — I4891 Unspecified atrial fibrillation: Secondary | ICD-10-CM | POA: Diagnosis present

## 2024-03-08 DIAGNOSIS — I517 Cardiomegaly: Secondary | ICD-10-CM | POA: Diagnosis not present

## 2024-03-08 DIAGNOSIS — I700 Atherosclerosis of aorta: Secondary | ICD-10-CM | POA: Diagnosis not present

## 2024-03-08 DIAGNOSIS — Z9621 Cochlear implant status: Secondary | ICD-10-CM | POA: Diagnosis present

## 2024-03-08 DIAGNOSIS — T380X5A Adverse effect of glucocorticoids and synthetic analogues, initial encounter: Secondary | ICD-10-CM | POA: Diagnosis not present

## 2024-03-08 DIAGNOSIS — G4733 Obstructive sleep apnea (adult) (pediatric): Secondary | ICD-10-CM | POA: Diagnosis present

## 2024-03-08 DIAGNOSIS — I5022 Chronic systolic (congestive) heart failure: Secondary | ICD-10-CM | POA: Diagnosis present

## 2024-03-08 DIAGNOSIS — Z96652 Presence of left artificial knee joint: Secondary | ICD-10-CM | POA: Diagnosis present

## 2024-03-08 DIAGNOSIS — Z1152 Encounter for screening for COVID-19: Secondary | ICD-10-CM

## 2024-03-08 DIAGNOSIS — K224 Dyskinesia of esophagus: Secondary | ICD-10-CM | POA: Diagnosis present

## 2024-03-08 DIAGNOSIS — Z9013 Acquired absence of bilateral breasts and nipples: Secondary | ICD-10-CM

## 2024-03-08 DIAGNOSIS — I130 Hypertensive heart and chronic kidney disease with heart failure and stage 1 through stage 4 chronic kidney disease, or unspecified chronic kidney disease: Secondary | ICD-10-CM | POA: Diagnosis present

## 2024-03-08 DIAGNOSIS — I5023 Acute on chronic systolic (congestive) heart failure: Secondary | ICD-10-CM

## 2024-03-08 DIAGNOSIS — J384 Edema of larynx: Principal | ICD-10-CM | POA: Diagnosis present

## 2024-03-08 DIAGNOSIS — I672 Cerebral atherosclerosis: Secondary | ICD-10-CM | POA: Diagnosis not present

## 2024-03-08 DIAGNOSIS — J029 Acute pharyngitis, unspecified: Secondary | ICD-10-CM | POA: Diagnosis not present

## 2024-03-08 DIAGNOSIS — M810 Age-related osteoporosis without current pathological fracture: Secondary | ICD-10-CM | POA: Diagnosis present

## 2024-03-08 DIAGNOSIS — N1832 Chronic kidney disease, stage 3b: Secondary | ICD-10-CM | POA: Diagnosis present

## 2024-03-08 DIAGNOSIS — R0602 Shortness of breath: Secondary | ICD-10-CM | POA: Diagnosis not present

## 2024-03-08 DIAGNOSIS — R739 Hyperglycemia, unspecified: Secondary | ICD-10-CM | POA: Diagnosis not present

## 2024-03-08 DIAGNOSIS — R6889 Other general symptoms and signs: Principal | ICD-10-CM

## 2024-03-08 DIAGNOSIS — K219 Gastro-esophageal reflux disease without esophagitis: Secondary | ICD-10-CM | POA: Diagnosis present

## 2024-03-08 DIAGNOSIS — Z87440 Personal history of urinary (tract) infections: Secondary | ICD-10-CM

## 2024-03-08 DIAGNOSIS — J387 Other diseases of larynx: Secondary | ICD-10-CM | POA: Diagnosis present

## 2024-03-08 DIAGNOSIS — H9190 Unspecified hearing loss, unspecified ear: Secondary | ICD-10-CM | POA: Diagnosis present

## 2024-03-08 DIAGNOSIS — Z79899 Other long term (current) drug therapy: Secondary | ICD-10-CM

## 2024-03-08 DIAGNOSIS — R29810 Facial weakness: Secondary | ICD-10-CM | POA: Diagnosis present

## 2024-03-08 DIAGNOSIS — R9082 White matter disease, unspecified: Secondary | ICD-10-CM | POA: Diagnosis not present

## 2024-03-08 DIAGNOSIS — E785 Hyperlipidemia, unspecified: Secondary | ICD-10-CM | POA: Diagnosis present

## 2024-03-08 DIAGNOSIS — Z8701 Personal history of pneumonia (recurrent): Secondary | ICD-10-CM

## 2024-03-08 DIAGNOSIS — Z7901 Long term (current) use of anticoagulants: Secondary | ICD-10-CM

## 2024-03-08 DIAGNOSIS — Z823 Family history of stroke: Secondary | ICD-10-CM

## 2024-03-08 DIAGNOSIS — R0989 Other specified symptoms and signs involving the circulatory and respiratory systems: Secondary | ICD-10-CM | POA: Diagnosis not present

## 2024-03-08 LAB — COMPREHENSIVE METABOLIC PANEL WITH GFR
ALT: 15 U/L (ref 0–44)
AST: 18 U/L (ref 15–41)
Albumin: 3.6 g/dL (ref 3.5–5.0)
Alkaline Phosphatase: 138 U/L — ABNORMAL HIGH (ref 38–126)
Anion gap: 11 (ref 5–15)
BUN: 28 mg/dL — ABNORMAL HIGH (ref 8–23)
CO2: 22 mmol/L (ref 22–32)
Calcium: 8.5 mg/dL — ABNORMAL LOW (ref 8.9–10.3)
Chloride: 103 mmol/L (ref 98–111)
Creatinine, Ser: 1.51 mg/dL — ABNORMAL HIGH (ref 0.44–1.00)
GFR, Estimated: 33 mL/min — ABNORMAL LOW (ref >60)
Glucose, Bld: 117 mg/dL — ABNORMAL HIGH (ref 70–99)
Potassium: 3.9 mmol/L (ref 3.5–5.1)
Sodium: 136 mmol/L (ref 135–145)
Total Bilirubin: 0.9 mg/dL (ref 0.0–1.2)
Total Protein: 7.1 g/dL (ref 6.5–8.1)

## 2024-03-08 LAB — CBC WITH DIFFERENTIAL/PLATELET
Abs Immature Granulocytes: 0.03 K/uL (ref 0.00–0.07)
Basophils Absolute: 0.1 K/uL (ref 0.0–0.1)
Basophils Relative: 1 %
Eosinophils Absolute: 0.1 K/uL (ref 0.0–0.5)
Eosinophils Relative: 1 %
HCT: 31.9 % — ABNORMAL LOW (ref 36.0–46.0)
Hemoglobin: 10.1 g/dL — ABNORMAL LOW (ref 12.0–15.0)
Immature Granulocytes: 0 %
Lymphocytes Relative: 16 %
Lymphs Abs: 1.4 K/uL (ref 0.7–4.0)
MCH: 28.4 pg (ref 26.0–34.0)
MCHC: 31.7 g/dL (ref 30.0–36.0)
MCV: 89.6 fL (ref 80.0–100.0)
Monocytes Absolute: 0.6 K/uL (ref 0.1–1.0)
Monocytes Relative: 6 %
Neutro Abs: 7.1 K/uL (ref 1.7–7.7)
Neutrophils Relative %: 76 %
Platelets: 248 K/uL (ref 150–400)
RBC: 3.56 MIL/uL — ABNORMAL LOW (ref 3.87–5.11)
RDW: 13.4 % (ref 11.5–15.5)
WBC: 9.3 K/uL (ref 4.0–10.5)
nRBC: 0 % (ref 0.0–0.2)

## 2024-03-08 LAB — URINALYSIS, W/ REFLEX TO CULTURE (INFECTION SUSPECTED)
Bacteria, UA: NONE SEEN
Bilirubin Urine: NEGATIVE
Glucose, UA: NEGATIVE mg/dL
Hgb urine dipstick: NEGATIVE
Ketones, ur: NEGATIVE mg/dL
Nitrite: POSITIVE — AB
Protein, ur: 100 mg/dL — AB
Specific Gravity, Urine: 1.009 (ref 1.005–1.030)
WBC, UA: >50 WBC/hpf (ref 0–5)
pH: 8 (ref 5.0–8.0)

## 2024-03-08 LAB — RESP PANEL BY RT-PCR (RSV, FLU A&B, COVID)  RVPGX2
Influenza A by PCR: NEGATIVE
Influenza B by PCR: NEGATIVE
Resp Syncytial Virus by PCR: NEGATIVE
SARS Coronavirus 2 by RT PCR: NEGATIVE

## 2024-03-08 LAB — BRAIN NATRIURETIC PEPTIDE: B Natriuretic Peptide: 425.2 pg/mL — ABNORMAL HIGH (ref 0.0–100.0)

## 2024-03-08 LAB — TROPONIN I (HIGH SENSITIVITY): Troponin I (High Sensitivity): 14 ng/L (ref <18)

## 2024-03-08 MED ORDER — IOHEXOL 350 MG/ML SOLN
75.0000 mL | Freq: Once | INTRAVENOUS | Status: AC | PRN
  Administered 2024-03-08: 75 mL via INTRAVENOUS

## 2024-03-08 MED ORDER — DEXAMETHASONE SOD PHOSPHATE PF 10 MG/ML IJ SOLN
8.0000 mg | Freq: Once | INTRAMUSCULAR | Status: AC
  Administered 2024-03-08: 8 mg via INTRAVENOUS

## 2024-03-08 MED ORDER — SODIUM CHLORIDE 0.9 % IV SOLN
1.0000 g | Freq: Once | INTRAVENOUS | Status: AC
  Administered 2024-03-08: 1 g via INTRAVENOUS
  Filled 2024-03-08: qty 10

## 2024-03-08 MED ORDER — LACTATED RINGERS IV BOLUS
500.0000 mL | Freq: Once | INTRAVENOUS | Status: AC
  Administered 2024-03-08: 500 mL via INTRAVENOUS

## 2024-03-08 NOTE — Progress Notes (Signed)
 [name] is a 89 y.o. female c/o cough, chest congestion, sore throat and shortness of breath.

## 2024-03-08 NOTE — Progress Notes (Addendum)
 Subjective Patient ID: [name] is a 89 y.o. female.    Patient is a 89 year old female who presents to the clinic accompanied with her husband for evaluation of flulike symptoms since Monday.  Patient has history of hypertension, and  has been taking  Coumadin .   Has history of stroke with right-sided deficit.  Reports that since past few days has been having runny nose, congestion, sore throat, difficulty swallowing, and very productive cough and shortness of breath.  Reports of subjective fevers or chills.  Denies any abdominal pain or NVD .  Denies any chest pain.  Reports her voice has changed and unable to speak a lot because of the pain and difficulty with swallowing and sob. No sick contact.  States she has been having difficulty swallowing solids.  She has been taking small sips of water.   History provided by:  Patient and spouse   Review of Systems  Constitutional:  Positive for fever. Negative for chills.  HENT:  Positive for congestion, drooling, rhinorrhea, sore throat, trouble swallowing and voice change. Negative for ear pain, facial swelling, postnasal drip, sinus pressure, sinus pain and sneezing.   Respiratory:  Positive for cough and shortness of breath. Negative for chest tightness.   Cardiovascular:  Negative for chest pain.  Gastrointestinal:  Negative for abdominal pain, nausea and vomiting.  Genitourinary:  Negative for dysuria.  Musculoskeletal:  Negative for myalgias.       Body ache  Skin:  Negative for rash.  Allergic/Immunologic: Negative for environmental allergies.  Neurological:  Negative for dizziness and headaches.    Patient History  Allergies: Allergies  Allergen Reactions   Ciprofloxacin  Rash and Other   Clindamycin/Lincomycin Other    PT STATES HER DOCTOR TOLD HER NOT TO TAKE CLINDAMYCIN BECAUSE SHE GOT C-DIFF AFTER TAKING AMPICILLIN- tolerated in 2022 (per spouse)  PT STATES HER DOCTOR TOLD HER NOT TO TAKE CLINDAMYCIN BECAUSE SHE GOT  C-DIFF AFTER TAKING AMPICILLIN   Doxycycline Diarrhea and Other   Warfarin     History reviewed. No pertinent past medical history. History reviewed. No pertinent surgical history. Social History   Socioeconomic History   Marital status: Not on file    Spouse name: Not on file   Number of children: Not on file   Years of education: Not on file   Highest education level: Not on file  Occupational History   Not on file  Tobacco Use   Smoking status: Never   Smokeless tobacco: Never  Substance and Sexual Activity   Alcohol  use: Not on file   Drug use: Not on file   Sexual activity: Not on file  Other Topics Concern   Not on file  Social History Narrative   Not on file   History reviewed. No pertinent family history. Current Outpatient Medications on File Prior to Visit  Medication Sig Dispense Refill   atorvastatin  (Lipitor) 20 MG tablet      chlorthalidone  (Hygroton ) 50 MG tablet Take 50 mg by mouth.     diphenhydrAMINE -acetaminophen  (Tylenol  PM Extra Strength) 25-500 MG per tablet Take 2 tablets by mouth 1 (one) time each day.     losartan  (Cozaar ) 100 MG tablet      pantoprazole  (ProtoNix ) 40 MG EC tablet      No current facility-administered medications on file prior to visit.    Objective  Vitals:   03/08/24 1908  BP: 134/62  BP Location: Right arm  Patient Position: Sitting  Pulse: (!) 56  Resp: 20  Temp: 36.8 C (98.3 F)  TempSrc: Tympanic  SpO2: 95%  Weight: 72.6 kg  PainSc: 0-No pain               No results found.  Physical Exam Constitutional:      Appearance: Normal appearance. She is ill-appearing.  HENT:     Nose: Congestion and rhinorrhea present.     Mouth/Throat:     Mouth: Mucous membranes are moist.     Pharynx: Posterior oropharyngeal erythema present.     Comments: Drooling, voice change noted.  No exudates. Cardiovascular:     Rate and Rhythm: Normal rate and regular rhythm.  Pulmonary:     Breath  sounds: Rhonchi and rales present.  Abdominal:     Palpations: Abdomen is soft.     Tenderness: There is no abdominal tenderness. There is no right CVA tenderness or left CVA tenderness.  Musculoskeletal:     Cervical back: Normal range of motion and neck supple.     Right lower leg: Edema present.     Left lower leg: Edema (Mild tenderness to palpation) present.  Skin:    General: Skin is warm.  Neurological:     General: No focal deficit present.     Mental Status: She is alert.  Psychiatric:        Mood and Affect: Mood normal.     Results for orders placed or performed in visit on 03/08/24  POCT rapid influenza mckesson  Component Result   Rapid Influenza A Ag Negative   Rapid Influenza B Ag Negative   Internal Quality Control Pass  POCT QuickVue Antigen Test  Component Result   Rapid Covid Negative   Internal Quality Control Pass  POCT rapid strep A manually resulted  Component Result   Rapid Strep A Screen Negative   Internal Quality Control Pass       Procedures MDM:     1+ Acute illness or injury that poses a threat to life or bodily function     Explanation of Medical Decision Making and variances from expected care:  H&P as above.  Vital signs acceptable.  Patient appears ill-appearing.  Has been drooling with difficulty with speaking  Differentials include but not limited to flu, COVID, strep, deep space infection, pneumonia, CHF, pulmonary edema.  Sepsis  COVID flu and strep is negative.  Will send a strep culture.  Lungs have crackles bilaterally.  Patient has been drooling and has difficulty with speaking.  Likely pneumonia versus CHF versus deep space infection in the throat.  discussed in detail with patient and husband.  Recommended to go to the ER for further workup and evaluation including blood workup and further imaging.  Discussed concerns and risk. Pt and spouse verbalized understanding.  Offered calling ambulance, husband declined.,   States he  will drive the patient. Patient is hemodynamically stable  Upon discharge.        Review of any test results: Three+     Assessment requiring historian other than patient: No     Independent visualization of image, tracing, or test: No     Discussion of management with another provider: No     Risk:: High            Assessment/Plan Diagnoses and all orders for this visit:  SOB (shortness of breath)  Acute cough  Sore throat -     Beta Strep Gp A Culture  Dysphagia, unspecified type  Other orders -     POCT  rapid influenza mckesson -     POCT QuickVue Antigen Test -     POCT rapid strep A manually resulted     Disposition Status: Emergency Department  Patient Instructions  Patient was seen in the clinic for shortness of breath, fevers, chills, flulike symptoms, difficulty swallowing and drooling. Rapid COVID and flu and strep is negative.  Patient has been drooling.   please go to the ER for further workup and evaluation, Patient needs higher level of care   Progress note signed by Gordy Queen, PA on 03/08/24 at  7:51 PM

## 2024-03-08 NOTE — ED Provider Triage Note (Signed)
 Emergency Medicine Provider Triage Evaluation Note  [name] , a 89 y.o. female  was evaluated in triage.  Pt complains of congestion, sore throat, cough, feels unwell. Patient with history of a-fib, HTN, CVA, HLD, on Coumadin . Symptoms started over 2-3 days. No fever. Nonsmoker.  Review of Systems  Positive: Thick congestion, sore throat Negative: Fever,   Physical Exam  BP 120/84 (BP Location: Right Arm)   Pulse 63   Temp 97.6 F (36.4 C) (Axillary)   Resp 20   Ht 5' 4 (1.626 m)   Wt 72 kg   SpO2 95%   BMI 27.25 kg/m  Gen:   Awake, significant upper airway congestion Resp:  Normal effort  MSK:   Moves extremities without difficulty  Other:    Medical Decision Making  Medically screening exam initiated at 8:26 PM.  Appropriate orders placed.  Johnny Latu was informed that the remainder of the evaluation will be completed by another provider, this initial triage assessment does not replace that evaluation, and the importance of remaining in the ED until their evaluation is complete.     Odell Balls, PA-C 03/08/24 2029

## 2024-03-08 NOTE — ED Triage Notes (Signed)
 Pt sent over from urgent care for chest xray. Pt c/o sob and difficulty swallowing. Pt attempting to spit the sputum out to clear throat but unable too.

## 2024-03-08 NOTE — ED Provider Notes (Signed)
 [place] EMERGENCY DEPARTMENT AT [facility] Provider Note   CSN: [id number] Arrival date & time: 03/08/24  2003     Patient presents with: No chief complaint on file.   [name] is a 89 y.o. female. Hx of A. fib on Coumadin , hypertension, osteoporosis, CVA presenting from urgent care with shortness of breath, difficulty swallowing.  History per patient and husband.  Endorses congestion, sore throat, cough, and generalized feeling unwell.  States some difficulty with swallowing mucus.  Crackles heard at urgent care, therefore sent to the ED for chest x-ray.  Patient has been endorses that patient has been having shortness of breath, sore throat for the past few days, yesterday having difficulty tolerating secretions.  Denies any fever or chills.  Also recently diagnosed with a UTI with urology, has not started antibiotics yet.  Family is concerned that patient may have aspirated, and not currently being treated for for a UTI.  Prior UTI culture grew E. coli and Proteus mirabilis.  Denies nausea or vomiting, however states that she really cannot tolerate secretions well at all.  Continues to have significant sore throat, which is concerning her as well.  Denies chest pain, endorses shortness of breath.  {Add pertinent medical, surgical, social history, OB history to HPI:32947} HPI     Prior to Admission medications  Medication Sig Start Date End Date Taking? Authorizing Provider  acetaminophen  (TYLENOL ) 325 MG tablet Take 2 tablets (650 mg total) by mouth every 6 (six) hours as needed for mild pain (or Fever >/= 101). 05/21/20   Angiulli, Toribio PARAS, PA-C  atorvastatin  (LIPITOR) 20 MG tablet Take 1 tablet (20 mg total) by mouth daily after supper. 05/21/20   Angiulli, Daniel J, PA-C  estradiol (ESTRACE) 0.1 MG/GM vaginal cream Place 1 Applicatorful vaginally 2 (two) times a week.    [provider]  fosfomycin (MONUROL) 3 g PACK Take 3 g by mouth every 3 (three) days.  08/20/23   [provider]  hydrALAZINE  (APRESOLINE ) 25 MG tablet TAKE 1 TABLET (25 MG TOTAL) BY MOUTH AT BEDTIME. 05/22/20 11/24/22  Angiulli, Toribio PARAS, PA-C  losartan  (COZAAR ) 25 MG tablet TAKE 3 TABLETS (75 MG TOTAL) BY MOUTH AT BEDTIME. 05/21/20 11/24/22  Angiulli, Toribio PARAS, PA-C  metoprolol  succinate (TOPROL -XL) 25 MG 24 hr tablet Take 0.5 tablets (12.5 mg total) by mouth daily. 05/06/22   Carlin Delon BROCKS, NP  Multiple Vitamins-Minerals (ONE-A-DAY PROACTIVE 65+) TABS Take 1 tablet by mouth daily with breakfast.    [provider]  Multiple Vitamins-Minerals (PRESERVISION AREDS) CAPS Take 1 capsule by mouth 2 (two) times daily.    [provider]  pantoprazole  (PROTONIX ) 40 MG tablet Take 1 tablet (40 mg total) by mouth daily. 05/22/20   Angiulli, Toribio PARAS, PA-C  Polyethyl Glyc-Propyl Glyc PF (SYSTANE ULTRA PF) 0.4-0.3 % SOLN Place 1 drop into both eyes at bedtime.    [provider]  Probiotic Product (VSL#3 PO) Take 1 capsule by mouth in the morning.    [provider]  Vitamin D3 (VITAMIN D ) 25 MCG tablet Take 1 tablet (1,000 Units total) by mouth daily. 05/21/20   Angiulli, Toribio PARAS, PA-C  warfarin (COUMADIN ) 5 MG tablet Take 1 tablet (5 mg total) by mouth daily. 05/22/20 11/24/22  Angiulli, Toribio PARAS, PA-C    Allergies: Pradaxa [dabigatran etexilate mesylate], Clindamycin/lincomycin, Latex, Ampicillin, and Sulfamethoxazole    Review of Systems  Updated Vital Signs BP 120/84 (BP Location: Right Arm)   Pulse 63  Temp 97.6 F (36.4 C) (Axillary)   Resp 20   Ht 5' 4 (1.626 m)   Wt 72 kg   SpO2 95%   BMI 27.25 kg/m   Physical Exam Vitals and nursing note reviewed.  Constitutional:      General: She is in acute distress.     Appearance: She is not ill-appearing.  HENT:     Head: Normocephalic and atraumatic.     Nose: Congestion present.     Mouth/Throat:     Mouth: Mucous membranes are moist.     Comments: No oropharyngeal erythema,  however large amount of mucus present within the oropharynx. Eyes:     Pupils: Pupils are equal, round, and reactive to light.  Cardiovascular:     Rate and Rhythm: Normal rate. Rhythm irregular.     Pulses: Normal pulses.     Heart sounds: Normal heart sounds. No murmur heard.    No gallop.  Pulmonary:     Effort: Pulmonary effort is normal. No respiratory distress.     Breath sounds: No stridor. Rales present. No wheezing or rhonchi.  Chest:     Chest wall: No tenderness.  Abdominal:     General: Abdomen is flat. There is no distension.     Palpations: Abdomen is soft.     Tenderness: There is no abdominal tenderness. There is no right CVA tenderness, left CVA tenderness, guarding or rebound.  Musculoskeletal:     Cervical back: Neck supple. No rigidity.     Right lower leg: Edema present.     Left lower leg: Edema present.     Comments: 2+ pitting edema bilateral lower extremities.  Skin:    General: Skin is warm.     Capillary Refill: Capillary refill takes 2 to 3 seconds.  Neurological:     Mental Status: She is alert and oriented to person, place, and time.     (all labs ordered are listed, but only abnormal results are displayed) Labs Reviewed  CBC WITH DIFFERENTIAL/PLATELET - Abnormal; Notable for the following components:      Result Value   RBC 3.56 (*)    Hemoglobin 10.1 (*)    HCT 31.9 (*)    All other components within normal limits  RESP PANEL BY RT-PCR (RSV, FLU A&B, COVID)  RVPGX2  COMPREHENSIVE METABOLIC PANEL WITH GFR  URINALYSIS, W/ REFLEX TO CULTURE (INFECTION SUSPECTED)    EKG: None  Radiology: DG Chest 2 View Result Date: 03/08/2024 EXAM: 2 VIEW(S) XRAY OF THE CHEST 03/08/2024 08:57:00 PM COMPARISON: 03/06/2021. CLINICAL HISTORY: congestion FINDINGS: LUNGS AND PLEURA: Vascular congestion. No overt edema. No focal pulmonary opacity. No pleural effusion. No pneumothorax. HEART AND MEDIASTINUM: Cardiomegaly. Aortic atherosclerosis. BONES AND SOFT  TISSUES: No acute osseous abnormality. IMPRESSION: 1. Vascular congestion without overt edema. 2. Cardiomegaly with aortic atherosclerosis. Electronically signed by: Franky Crease MD 03/08/2024 09:05 PM EST RP Workstation: [id number]    {Document cardiac monitor, telemetry assessment procedure when appropriate:32947} Procedures   Medications Ordered in the ED - No data to display    {Click here for ABCD2, HEART and other calculators REFRESH Note before signing:1}                              Medical Decision Making Amount and/or Complexity of Data Reviewed Labs: ordered. Radiology: ordered.   ***  {Document critical care time when appropriate  Document review of labs and clinical decision tools ie CHADS2VASC2,  etc  Document your independent review of radiology images and any outside records  Document your discussion with family members, caretakers and with consultants  Document social determinants of health affecting pt's care  Document your decision making why or why not admission, treatments were needed:32947:::1}   Final diagnoses:  None    ED Discharge Orders     None

## 2024-03-09 DIAGNOSIS — Z96652 Presence of left artificial knee joint: Secondary | ICD-10-CM | POA: Diagnosis present

## 2024-03-09 DIAGNOSIS — K224 Dyskinesia of esophagus: Secondary | ICD-10-CM | POA: Diagnosis present

## 2024-03-09 DIAGNOSIS — I5023 Acute on chronic systolic (congestive) heart failure: Secondary | ICD-10-CM | POA: Diagnosis present

## 2024-03-09 DIAGNOSIS — J384 Edema of larynx: Secondary | ICD-10-CM | POA: Diagnosis present

## 2024-03-09 DIAGNOSIS — N281 Cyst of kidney, acquired: Secondary | ICD-10-CM | POA: Diagnosis not present

## 2024-03-09 DIAGNOSIS — Z1152 Encounter for screening for COVID-19: Secondary | ICD-10-CM | POA: Diagnosis not present

## 2024-03-09 DIAGNOSIS — Z853 Personal history of malignant neoplasm of breast: Secondary | ICD-10-CM | POA: Diagnosis not present

## 2024-03-09 DIAGNOSIS — C50919 Malignant neoplasm of unspecified site of unspecified female breast: Secondary | ICD-10-CM

## 2024-03-09 DIAGNOSIS — I639 Cerebral infarction, unspecified: Secondary | ICD-10-CM | POA: Diagnosis not present

## 2024-03-09 DIAGNOSIS — Z823 Family history of stroke: Secondary | ICD-10-CM | POA: Diagnosis not present

## 2024-03-09 DIAGNOSIS — R6889 Other general symptoms and signs: Secondary | ICD-10-CM | POA: Diagnosis not present

## 2024-03-09 DIAGNOSIS — I5022 Chronic systolic (congestive) heart failure: Secondary | ICD-10-CM | POA: Diagnosis present

## 2024-03-09 DIAGNOSIS — Z8249 Family history of ischemic heart disease and other diseases of the circulatory system: Secondary | ICD-10-CM | POA: Diagnosis not present

## 2024-03-09 DIAGNOSIS — T380X5A Adverse effect of glucocorticoids and synthetic analogues, initial encounter: Secondary | ICD-10-CM | POA: Diagnosis not present

## 2024-03-09 DIAGNOSIS — J387 Other diseases of larynx: Secondary | ICD-10-CM | POA: Diagnosis present

## 2024-03-09 DIAGNOSIS — G4733 Obstructive sleep apnea (adult) (pediatric): Secondary | ICD-10-CM | POA: Diagnosis present

## 2024-03-09 DIAGNOSIS — N1832 Chronic kidney disease, stage 3b: Secondary | ICD-10-CM | POA: Diagnosis present

## 2024-03-09 DIAGNOSIS — Y838 Other surgical procedures as the cause of abnormal reaction of the patient, or of later complication, without mention of misadventure at the time of the procedure: Secondary | ICD-10-CM | POA: Diagnosis present

## 2024-03-09 DIAGNOSIS — Z7901 Long term (current) use of anticoagulants: Secondary | ICD-10-CM

## 2024-03-09 DIAGNOSIS — E785 Hyperlipidemia, unspecified: Secondary | ICD-10-CM | POA: Diagnosis present

## 2024-03-09 DIAGNOSIS — M810 Age-related osteoporosis without current pathological fracture: Secondary | ICD-10-CM | POA: Diagnosis present

## 2024-03-09 DIAGNOSIS — N179 Acute kidney failure, unspecified: Secondary | ICD-10-CM | POA: Diagnosis present

## 2024-03-09 DIAGNOSIS — G9782 Other postprocedural complications and disorders of nervous system: Secondary | ICD-10-CM | POA: Diagnosis present

## 2024-03-09 DIAGNOSIS — N3000 Acute cystitis without hematuria: Secondary | ICD-10-CM | POA: Diagnosis present

## 2024-03-09 DIAGNOSIS — I4891 Unspecified atrial fibrillation: Secondary | ICD-10-CM

## 2024-03-09 DIAGNOSIS — Z87440 Personal history of urinary (tract) infections: Secondary | ICD-10-CM | POA: Diagnosis not present

## 2024-03-09 DIAGNOSIS — H9190 Unspecified hearing loss, unspecified ear: Secondary | ICD-10-CM | POA: Diagnosis present

## 2024-03-09 DIAGNOSIS — I130 Hypertensive heart and chronic kidney disease with heart failure and stage 1 through stage 4 chronic kidney disease, or unspecified chronic kidney disease: Secondary | ICD-10-CM

## 2024-03-09 DIAGNOSIS — K219 Gastro-esophageal reflux disease without esophagitis: Secondary | ICD-10-CM

## 2024-03-09 DIAGNOSIS — R739 Hyperglycemia, unspecified: Secondary | ICD-10-CM | POA: Diagnosis not present

## 2024-03-09 DIAGNOSIS — Z9013 Acquired absence of bilateral breasts and nipples: Secondary | ICD-10-CM | POA: Diagnosis not present

## 2024-03-09 LAB — URINE CULTURE

## 2024-03-09 LAB — BASIC METABOLIC PANEL WITH GFR
Anion gap: 10 (ref 5–15)
BUN: 27 mg/dL — ABNORMAL HIGH (ref 8–23)
CO2: 22 mmol/L (ref 22–32)
Calcium: 8.4 mg/dL — ABNORMAL LOW (ref 8.9–10.3)
Chloride: 104 mmol/L (ref 98–111)
Creatinine, Ser: 1.46 mg/dL — ABNORMAL HIGH (ref 0.44–1.00)
GFR, Estimated: 34 mL/min — ABNORMAL LOW (ref >60)
Glucose, Bld: 133 mg/dL — ABNORMAL HIGH (ref 70–99)
Potassium: 4.3 mmol/L (ref 3.5–5.1)
Sodium: 136 mmol/L (ref 135–145)

## 2024-03-09 LAB — GLUCOSE, CAPILLARY
Glucose-Capillary: 140 mg/dL — ABNORMAL HIGH (ref 70–99)
Glucose-Capillary: 136 mg/dL — ABNORMAL HIGH (ref 70–99)
Glucose-Capillary: 155 mg/dL — ABNORMAL HIGH (ref 70–99)
Glucose-Capillary: 141 mg/dL — ABNORMAL HIGH (ref 70–99)

## 2024-03-09 LAB — CBC
HCT: 32.2 % — ABNORMAL LOW (ref 36.0–46.0)
Hemoglobin: 10.6 g/dL — ABNORMAL LOW (ref 12.0–15.0)
MCH: 29.4 pg (ref 26.0–34.0)
MCHC: 32.9 g/dL (ref 30.0–36.0)
MCV: 89.2 fL (ref 80.0–100.0)
Platelets: 242 K/uL (ref 150–400)
RBC: 3.61 MIL/uL — ABNORMAL LOW (ref 3.87–5.11)
RDW: 13.4 % (ref 11.5–15.5)
WBC: 11 K/uL — ABNORMAL HIGH (ref 4.0–10.5)
nRBC: 0 % (ref 0.0–0.2)

## 2024-03-09 LAB — MRSA NEXT GEN BY PCR, NASAL: MRSA by PCR Next Gen: NOT DETECTED

## 2024-03-09 LAB — PROTIME-INR
INR: 2.1 — ABNORMAL HIGH (ref 0.8–1.2)
Prothrombin Time: 25 s — ABNORMAL HIGH (ref 11.4–15.2)

## 2024-03-09 LAB — MAGNESIUM: Magnesium: 1.6 mg/dL — ABNORMAL LOW (ref 1.7–2.4)

## 2024-03-09 LAB — TROPONIN I (HIGH SENSITIVITY): Troponin I (High Sensitivity): 15 ng/L (ref <18)

## 2024-03-09 LAB — PHOSPHORUS: Phosphorus: 3.3 mg/dL (ref 2.5–4.6)

## 2024-03-09 MED ORDER — CHLORHEXIDINE GLUCONATE CLOTH 2 % EX PADS
6.0000 | MEDICATED_PAD | Freq: Every day | CUTANEOUS | Status: DC
  Administered 2024-03-10: 6 via TOPICAL

## 2024-03-09 MED ORDER — INSULIN ASPART 100 UNIT/ML IJ SOLN
0.0000 [IU] | INTRAMUSCULAR | Status: DC
  Administered 2024-03-09: 1 [IU] via SUBCUTANEOUS
  Administered 2024-03-09: 2 [IU] via SUBCUTANEOUS
  Administered 2024-03-09 (×2): 1 [IU] via SUBCUTANEOUS
  Administered 2024-03-10 (×2): 3 [IU] via SUBCUTANEOUS
  Administered 2024-03-10: 2 [IU] via SUBCUTANEOUS
  Administered 2024-03-10: 3 [IU] via SUBCUTANEOUS
  Administered 2024-03-10: 1 [IU] via SUBCUTANEOUS
  Administered 2024-03-11 (×4): 2 [IU] via SUBCUTANEOUS
  Administered 2024-03-11: 5 [IU] via SUBCUTANEOUS
  Administered 2024-03-12: 01:00:00 2 [IU] via SUBCUTANEOUS
  Filled 2024-03-09 (×4): qty 2
  Filled 2024-03-09: qty 3
  Filled 2024-03-09: qty 2
  Filled 2024-03-09 (×2): qty 1
  Filled 2024-03-09: qty 2
  Filled 2024-03-09 (×2): qty 3
  Filled 2024-03-09: qty 5
  Filled 2024-03-09: qty 2
  Filled 2024-03-09: qty 1

## 2024-03-09 MED ORDER — MAGNESIUM SULFATE 2 GM/50ML IV SOLN
2.0000 g | Freq: Once | INTRAVENOUS | Status: AC
  Administered 2024-03-09: 2 g via INTRAVENOUS
  Filled 2024-03-09: qty 50

## 2024-03-09 MED ORDER — DOCUSATE SODIUM 100 MG PO CAPS: 100.0000 mg | ORAL_CAPSULE | Freq: Two times a day (BID) | ORAL | Status: DC | PRN

## 2024-03-09 MED ORDER — ORAL CARE MOUTH RINSE: 15.0000 mL | OROMUCOSAL | Status: DC | PRN

## 2024-03-09 MED ORDER — SODIUM CHLORIDE 0.9 % IV SOLN
3.0000 g | Freq: Once | INTRAVENOUS | Status: AC
  Administered 2024-03-09: 3 g via INTRAVENOUS
  Filled 2024-03-09: qty 8

## 2024-03-09 MED ORDER — DEXAMETHASONE SODIUM PHOSPHATE 4 MG/ML IJ SOLN
4.0000 mg | Freq: Once | INTRAMUSCULAR | Status: AC
  Administered 2024-03-09: 4 mg via INTRAVENOUS
  Filled 2024-03-09: qty 1

## 2024-03-09 MED ORDER — DEXAMETHASONE SODIUM PHOSPHATE 4 MG/ML IJ SOLN
4.0000 mg | Freq: Three times a day (TID) | INTRAMUSCULAR | Status: DC
  Administered 2024-03-09 – 2024-03-12 (×10): 4 mg via INTRAVENOUS
  Filled 2024-03-09 (×10): qty 1

## 2024-03-09 MED ORDER — ONDANSETRON HCL 4 MG/2ML IJ SOLN: 4.0000 mg | Freq: Four times a day (QID) | INTRAMUSCULAR | Status: DC | PRN

## 2024-03-09 MED ORDER — SODIUM CHLORIDE 0.9 % IV SOLN
3.0000 g | Freq: Two times a day (BID) | INTRAVENOUS | Status: DC
  Administered 2024-03-09 – 2024-03-12 (×7): 3 g via INTRAVENOUS
  Filled 2024-03-09 (×7): qty 8

## 2024-03-09 NOTE — Progress Notes (Signed)
 Sent 10d keflex  500mg  2x daily.

## 2024-03-09 NOTE — Plan of Care (Addendum)
 [name] is a 89 y.o. female with medical history significant of atrial fibrillation on Coumadin , left ventricular concentric hypertrophy with preserved EF 70 to 75%, hyperlipidemia, CKD, TIA and chronic bradycardia, presented to emergency department from urgent care with complaining of shortness of breath and swallowing difficulty.  Per patient's husband she is endorsing chest congestion, sore throat, cough, and generalized not feeling very well.  Also reported difficulty swallowing and cracking along the lung.  Patient endorses he is having shortness of breath, sore throat for past few days and yesterday having difficulty tolerating secretion.  Denies any fever and chill.  Recently diagnosed with TIA has not been started on any antibiotic yet.  Family concerned patient probably aspirated, prior urine culture grew E. coli Proteus Mirabella's.  Denies any nausea, vomiting however she states that she cannot swallow secretion due to sore throat.  Denies any chest pain.   ED Course:  At presentation to ED patient is tachypneic  21 and hypertensive blood pressure 180/77.  O2 sat 94 to 95% room air.  CT soft tissue neck showing: Supraglottic laryngeal and hypopharyngeal edema with possible 1.9 cm hemorrhage vs mass in this region as detailed above. Findings are indeterminant but could be secondary to malignancy, infection, or trauma. Recommend ENT consultation and direct visualization.  Chest x-ray showed pulmonary vascular congestion without overt edema. cardiomegaly. Chest CT with contrast no acute cardiopulmonary abnormality. CT head no acute intracranial abnormality.   Lab, CBC unremarkable stable H&H normal WBC platelet count.  Respiratory panel negative for COVID RSV flu.  CMP showed elevated creatinine 1.5 otherwise unremarkable.  Elevated BNP 425.  Flat troponin.  UA showing evidence of UTI.  Hospitalist consulted for admission for management of laryngeal/hypopharyngeal hemorrhage/mass,  possible new onset of CHF, AKI sore throat and UTI.  In the ED patient received Unasyn, ceftriaxone , Decadron  and 500 mL of LR bolus.  ED physician consulted ENT pending recommendation and evaluation for laryngeal and hypopharyngeal edema concern for hemorrhage versus mass.   Deferring admission until ENT will give further recommendation.   Valerie Fredin, MD [facility] 03/09/2024, 12:43 AM

## 2024-03-09 NOTE — Evaluation (Signed)
 Clinical/Bedside Swallow Evaluation Patient Details  Name: [name] MRN: [id number] Date of Birth: 07-02-1933  Today's Date: 03/09/2024 Time: SLP Start Time (ACUTE ONLY): 1359 SLP Stop Time (ACUTE ONLY): 1422 SLP Time Calculation (min) (ACUTE ONLY): 23 min  Past Medical History:  Past Medical History:  Diagnosis Date   Arthritis    fingers, hips, feet, ankles (11/10/2012)   Asthma    Atrial fibrillation (HCC)    CHRONIC COUMADIN    Breast cancer (HCC) 1990's   cancer on one side; precancerous tissue on the other (11/10/2012)   Chronic bronchitis (HCC)    multiple times; not in a long time (11/10/2012   Depression    GERD (gastroesophageal reflux disease)    Hearing impaired    Hypertension    Irritable bowel    Lymphedema    HX OF - IN LEFT ARM--NO NEEDLES OR B/P'S LEFT ARM   Macular degeneration    BEGINNINGS OF MACULAR DEGENERATION   Osteoporosis    Pneumonia    multiple times; not in a long time (11/10/2012)   Recurrent UTI    Sleep apnea    dx'd w/it; don't wear mask or anything (11/10/2012)   Stroke (HCC) ~ 2005   HX OF TIA-NO RESIDUAL PROBLEM--PARALYSIS RT SIDE FACE /PT'S MOUTH DROOPS-AND LOSS OF HEARING RT EAR --SINCE EAR SURGERY AS A CHILD    UTI (lower urinary tract infection)    FREQUENT   Past Surgical History:  Past Surgical History:  Procedure Laterality Date   BREAST BIOPSY Bilateral 1990's   Dacryocystorhinostomy  02/18/2016   INNER EAR SURGERY Right    MULTIPLE EAR SURGERIES,   INTRAMEDULLARY (IM) NAIL INTERTROCHANTERIC Left 04/23/2020   Procedure: INTRAMEDULLARY (IM) NAIL INTERTROCHANTRIC;  Surgeon: Kendal Franky SQUIBB, MD;  Location: [facility] OR;  Service: Orthopedics;  Laterality: Left;   JOINT REPLACEMENT     KNEE ARTHROSCOPY  04/07/2012   Procedure: ARTHROSCOPY KNEE;  Surgeon: Dempsey LULLA Moan, MD;  Location: [facility];  Service: Orthopedics;  Laterality: Left;  WITH SYNOVECTOMY   MASTECTOMY Bilateral 1990's   MASTOIDECTOMY Right  1942   ORIF FEMUR FRACTURE Left 08/08/2017   Procedure: OPEN REDUCTION INTERNAL FIXATION (ORIF) DISTAL FEMUR FRACTURE;  Surgeon: Kendal Franky SQUIBB, MD;  Location: [facility] OR;  Service: Orthopedics;  Laterality: Left;   TONSILLECTOMY     TOTAL KNEE ARTHROPLASTY  09/20/2011   LEFT TOTAL KNEE ARTHROPLASTY;  Surgeon: Dempsey LULLA Moan, MD;  Location: [facility];  Service: Orthopedics;  Laterality: Left;   TOTAL KNEE ARTHROPLASTY  2006   HPI:  [name] presented to [facility] on 03/09/24 from urgent care with fevers/chills, SOB, congestion, sore throat, dysphagia, cough productive of mucous, changes to voice, difficulty speaking and difficulty managing secretions/drooling. CT Head and CT Chest negative for acute processes. CT Neck revealed supraglottic laryngeal and hypopharyngeal edema with possible 1.9 cm  hemorrhage versus mass in this region. Pt scoped by ENT who's assessment is consistent with supraglottic edema with unclear etiology. Pt started on antibiotics and steriods. SLP consulted for clinical swallow evaluation.    Assessment / Plan / Recommendation  Clinical Impression   Pt presents with overt s/s of a pharyngeal dysphagia and suspected airway violation per clinical swallow assessment completed today. Cleared to start a clear liquid, thin liquids diet with pertinent PO meds crushed in applesauce. Plan for MBSS tomorrow, 12/13, per radiology availability.   Pt reported acute onset of dysphagia about 2-3 days ago. She last consumed soup without chunks yesterday around lunch time.  She has avoided solids for a couple of days, though endorsed this is due to GI upset. She denied and acute choking event. Pt's voice moderately wet and hoarse. Cued throat clear + swallow was challenging for her and did not significantly clear her voice. Some improvement in vocal quality observed with sips of water; however, a delayed wet throat clear and coughing incidents followed.   We discussed the need for a MBSS given concerns  for supraglottic edema for further objective assessment. Visualization of trachea on FEES will likely be limited given ENT's description or secretions and limited visualization during endoscopy assessment. Recommend clear liquids only due to risk of asphyxiation with solids.   Plan: MBSS tomorrow, 03/10/24.  SLP Visit Diagnosis: Dysphagia, unspecified (R13.10)    Aspiration Risk  Mild aspiration risk;Moderate aspiration risk    Diet Recommendation Thin liquid (clear liquids)    Liquid Administration via: Spoon;Cup;Straw Medication Administration: Crushed with puree Supervision: Patient able to self feed Compensations: Slow rate;Small sips/bites Postural Changes: Seated upright at 90 degrees    Other Recommendations Oral Care Recommendations: Oral care QID      Assistance Recommended at Discharge  TBD  Functional Status Assessment Patient has had a recent decline in their functional status and demonstrates the ability to make significant improvements in function in a reasonable and predictable amount of time.  Frequency and Duration min 1 x/week  2 weeks       Prognosis Prognosis for improved oropharyngeal function: Fair      Swallow Study   General Date of Onset: 03/09/24 HPI: Ms. Record presented to [facility] on 03/09/24 from urgent care with fevers/chills, SOB, congestion, sore throat, dysphagia, cough productive of mucous, changes to voice, difficulty speaking and difficulty managing secretions/drooling. CT Head and CT Chest negative for acute processes. CT Neck revealed supraglottic laryngeal and hypopharyngeal edema with possible 1.9 cm  hemorrhage versus mass in this region. Pt scoped by ENT who recommended antibiotics and steriods. Pt with patent airway at this time. SLP consulted for clinical swallow evaluation. Type of Study: Bedside Swallow Evaluation Previous Swallow Assessment: Clinical swallow evaluation in 02/24/21. Evtually advanced to a regular diet and thin liquids  during that hospitalization Diet Prior to this Study: NPO Temperature Spikes Noted: No Respiratory Status: Nasal cannula (2L) History of Recent Intubation: No Behavior/Cognition: Alert;Cooperative Oral Cavity Assessment: Within Functional Limits Oral Cavity - Dentition: Adequate natural dentition Vision: Functional for self-feeding Self-Feeding Abilities: Able to feed self Patient Positioning: Upright in chair Baseline Vocal Quality: Wet;Hoarse Volitional Cough: Strong Volitional Swallow: Able to elicit (with effort)    Oral/Motor/Sensory Function Overall Oral Motor/Sensory Function: Mild impairment Facial ROM: Reduced right Facial Symmetry: Abnormal symmetry right Facial Strength: Reduced right   Ice Chips Ice chips: Within functional limits Presentation: Self Fed   Thin Liquid Thin Liquid: Impaired Pharyngeal  Phase Impairments: Multiple swallows;Cough - Delayed;Throat Clearing - Delayed    Nectar Thick Nectar Thick Liquid: Not tested   Honey Thick Honey Thick Liquid: Not tested   Puree Puree: Impaired Pharyngeal Phase Impairments: Multiple swallows;Throat Clearing - Delayed   Solid     Solid: Not tested      [name] 03/09/2024,2:52 PM

## 2024-03-09 NOTE — ED Provider Notes (Signed)
 Dr. Roark recommends PCCM admission to ICU.  Recommends adding 4 more mg of decadron .  I consulted with Dr. Layman, who is appreciated for admitting.   Vicky Charleston, PA-C 03/09/24 0112    Palumbo, April, MD 03/09/24 [phone]

## 2024-03-09 NOTE — H&P (Signed)
 NAME:  [name], MRN:  [id number], DOB:  Apr 08, 1933, LOS: 0 ADMISSION DATE:  03/08/2024 CONSULTATION DATE:  03/09/2024 REFERRING MD:  Arlee - EDP, CHIEF COMPLAINT:  Difficulty managing secretions, flu-like symptoms  History of Present Illness:  89 year old woman who presented to [facility] ED 12/11 from UC for difficulty managing secretions. PMHx significant for  Patient presented to [facility] 12/11 for flu-like symptoms x 4 days. Endorses subjective fevers/chills, SOB, congestion, sore throat, dysphagia, cough productive of mucous. Also endorses changes to voice, difficulty speaking and difficulty managing secretions/drooling. Initial UC workup included COVID/Flu/strep testing (negative). Recommended presentation to ER for further care.  On ED arrival, patient was afebrile with HR 63, BP 120/84, RR 20, SpO2 95%. Labs were notable for WBC 9.3, Hgb 10.1, Plt 248. Na 136, K 3.9, CO2 22, Cr 1.51 (baseline 1.1-1.2). CT Head NAICA, CTA Head no LVO, CT Chest unremarkable. CT Soft Tissue Neck demonstrated supraglottic laryngeal and hypopharyngeal edema with possible 1.9 cm hemorrhage versus mass. ENT was consulted with recommendation for scope to evaluate/localize laryngeal swelling/?mass. Scope demonstrated supraglottic edema for which antibiotics and steroids were recommended with close monitoring in the ICU.  PCCM consulted for ICU admission.  Pertinent Medical History:   Past Medical History:  Diagnosis Date   Arthritis    fingers, hips, feet, ankles (11/10/2012)   Asthma    Atrial fibrillation (HCC)    CHRONIC COUMADIN    Breast cancer (HCC) 1990's   cancer on one side; precancerous tissue on the other (11/10/2012)   Chronic bronchitis (HCC)    multiple times; not in a long time (11/10/2012   Depression    GERD (gastroesophageal reflux disease)    Hearing impaired    Hypertension    Irritable bowel    Lymphedema    HX OF - IN LEFT ARM--NO NEEDLES OR B/P'S LEFT ARM   Macular degeneration     BEGINNINGS OF MACULAR DEGENERATION   Osteoporosis    Pneumonia    multiple times; not in a long time (11/10/2012)   Recurrent UTI    Sleep apnea    dx'd w/it; don't wear mask or anything (11/10/2012)   Stroke (HCC) ~ 2005   HX OF TIA-NO RESIDUAL PROBLEM--PARALYSIS RT SIDE FACE /PT'S MOUTH DROOPS-AND LOSS OF HEARING RT EAR --SINCE EAR SURGERY AS A CHILD    UTI (lower urinary tract infection)    FREQUENT   Significant Hospital Events: Including procedures, antibiotic start and stop dates in addition to other pertinent events   12/11 - Presented to UC for flu-like symptoms x 4 days. Difficult controlling secretions, voice changes. CT Head NAICA, CT Soft Tissue Neck supraglottic laryngeal and hypopharyngeal edema with possible 1.9 cm hemorrhage versus mass. 12/12 - ENT consulted, request ICU admission for close monitoring.  Interim History / Subjective:  PCCM consulted for interval close monitoring/airway watch  Objective:  Blood pressure (!) 160/56, pulse 78, temperature 97.7 F (36.5 C), temperature source Oral, resp. rate 16, height 5' 4 (1.626 m), weight 72 kg, SpO2 94%.        Intake/Output Summary (Last 24 hours) at 03/09/2024 0258 Last data filed at 03/08/2024 2303 Gross per 24 hour  Intake 47.63 ml  Output --  Net 47.63 ml   Filed Weights   03/08/24 2019  Weight: 72 kg   Physical Examination: General: Acute-on-chronically ill-appearing woman in NAD. Pleasant and conversant. HEENT: [place]/AT, anicteric sclera, PERRL, moist mucous membranes. HOH (+L cochlear implant). Neuro: Awake, oriented x 4. Responds to verbal stimuli. Following  commands consistently. Moves all 4 extremities spontaneously.  CV: RRR, no m/g/r. PULM: Breathing even and unlabored on RA. Lung fields CTAB. GI: Soft, nontender, nondistended. Normoactive bowel sounds. Extremities: No LE edema noted. Skin: Warm/dry, slight jaundice.  Resolved Hospital Problem List:    Assessment & Plan:  Acute onset  of supraglottic edema/swelling - Ceftriaxone  and Decadron  - ENT consulted, following (scope as above) - Recommend monitoring in ICU - Airway watch; low threshold for intubation if worsening ability to manage secretions  HTN Afib (on Coumadin ) CVA - Cardiac monitoring - Optimize electrolytes for K > 4, Mg > 2 - Hold AC in the setting of elected INR for AC - F/u Echo  OSA - Supplemental O2 support, wean FiO2 for O2 sat > 90% - Does not wear mask at night - Pulmonary hygiene - PAD protocol for sedation: None needed  Recurrent UTI AKI on CKD stage 3B UA nitrite positive, +leuks. Urine Cx +E.coli, proteus. 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  - No issues, speak slowly and clearly  Labs:  CBC: Recent Labs  Lab 03/08/24 2037 03/09/24 0207  WBC 9.3 11.0*  NEUTROABS 7.1  --   HGB 10.1* 10.6*  HCT 31.9* 32.2*  MCV 89.6 89.2  PLT 248 242   Basic Metabolic Panel: Recent Labs  Lab 03/08/24 2037 03/09/24 0207  NA 136 136  K 3.9 4.3  CL 103 104  CO2 22 22  GLUCOSE 117* 133*  BUN 28* 27*  CREATININE 1.51* 1.46*  CALCIUM  8.5* 8.4*  MG  --  1.6*  PHOS  --  3.3   GFR: Estimated Creatinine Clearance: 25.9 mL/min (A) (by C-G formula based on SCr of 1.46 mg/dL (H)). Recent Labs  Lab 03/08/24 2037 03/09/24 0207  WBC 9.3 11.0*   Liver Function Tests: Recent Labs  Lab 03/08/24 2037  AST 18  ALT 15  ALKPHOS 138*  BILITOT 0.9  PROT 7.1  ALBUMIN  3.6   No results for input(s): LIPASE, AMYLASE in the last 168 hours. No results for input(s): AMMONIA in the last 168 hours.  ABG:    Component Value Date/Time   PHART 7.408  08/08/2017 1624   PCO2ART 35.4 08/08/2017 1624   PO2ART 92.0 08/08/2017 1624   HCO3 22.3 08/08/2017 1624   TCO2 23 08/08/2017 1624   ACIDBASEDEF 2.0 08/08/2017 1624   O2SAT 97.0 08/08/2017 1624    Coagulation Profile: Recent Labs  Lab 03/09/24 0207  INR 2.1*    Cardiac Enzymes: No results for input(s): CKTOTAL, CKMB, CKMBINDEX, TROPONINI in the last 168 hours.  HbA1C: Hgb A1c MFr Bld  Date/Time Value Ref Range Status  04/28/2020 02:20 AM 5.6 4.8 - 5.6 % Final    Comment:    (NOTE) Pre diabetes:          5.7%-6.4%  Diabetes:              >6.4%  Glycemic control for   <7.0% adults with diabetes    CBG: No results for input(s): GLUCAP in the last 168 hours.  Review of Systems:   Review of systems completed with pertinent positives/negatives outlined in above HPI.  Past Medical History:  She,  has a past medical history of Arthritis, Asthma, Atrial fibrillation (HCC), Breast cancer (HCC) (1990's), Chronic bronchitis (HCC), Depression, GERD (gastroesophageal reflux disease), Hearing impaired, Hypertension, Irritable bowel, Lymphedema, Macular degeneration, Osteoporosis, Pneumonia, Recurrent UTI, Sleep apnea, Stroke (HCC) (~ 2005), and UTI (lower urinary tract infection).   Surgical History:   Past Surgical History:  Procedure Laterality Date   BREAST BIOPSY Bilateral 1990's   Dacryocystorhinostomy  02/18/2016   INNER EAR SURGERY Right    MULTIPLE EAR SURGERIES,   INTRAMEDULLARY (IM) NAIL INTERTROCHANTERIC Left 04/23/2020   Procedure: INTRAMEDULLARY (IM) NAIL INTERTROCHANTRIC;  Surgeon: Kendal Franky SQUIBB, MD;  Location: [facility] OR;  Service: Orthopedics;  Laterality: Left;   JOINT REPLACEMENT     KNEE ARTHROSCOPY  04/07/2012   Procedure: ARTHROSCOPY KNEE;  Surgeon: Dempsey LULLA Moan, MD;  Location: [facility];  Service: Orthopedics;  Laterality: Left;  WITH SYNOVECTOMY   MASTECTOMY Bilateral 1990's   MASTOIDECTOMY Right 1942   ORIF FEMUR FRACTURE Left  08/08/2017   Procedure: OPEN REDUCTION INTERNAL FIXATION (ORIF) DISTAL FEMUR FRACTURE;  Surgeon: Kendal Franky SQUIBB, MD;  Location: [facility] OR;  Service: Orthopedics;  Laterality: Left;   TONSILLECTOMY     TOTAL KNEE ARTHROPLASTY  09/20/2011   LEFT TOTAL KNEE ARTHROPLASTY;  Surgeon: Dempsey LULLA Moan, MD;  Location: [facility];  Service: Orthopedics;  Laterality: Left;   TOTAL KNEE ARTHROPLASTY  2006   Social History:   reports that she has never smoked. She has never used smokeless tobacco. She reports that she does not drink alcohol  and does not use drugs.   Family History:  Her family history includes CAD in her father and mother; Stroke in her mother.   Allergies: Allergies[1]   Home Medications: Prior to Admission medications  Medication Sig Start Date End Date Taking? Authorizing Provider  acetaminophen  (TYLENOL ) 325 MG tablet Take 2 tablets (650 mg total) by mouth every 6 (six) hours as needed for mild pain (or Fever >/= 101). 05/21/20   Angiulli, Toribio PARAS, PA-C  atorvastatin  (LIPITOR) 20 MG tablet Take 1 tablet (20 mg total) by mouth daily after supper. 05/21/20   Angiulli, Daniel J, PA-C  estradiol (ESTRACE) 0.1 MG/GM vaginal cream Place 1 Applicatorful vaginally 2 (two) times a week.    [provider]  fosfomycin (MONUROL) 3 g PACK Take 3 g by mouth every 3 (three) days. 08/20/23   [provider]  hydrALAZINE  (APRESOLINE ) 25 MG tablet TAKE 1 TABLET (25 MG TOTAL) BY MOUTH AT BEDTIME. 05/22/20 11/24/22  Angiulli, Toribio PARAS, PA-C  losartan  (COZAAR ) 25 MG tablet TAKE 3 TABLETS (75 MG TOTAL) BY MOUTH AT BEDTIME. 05/21/20 11/24/22  Angiulli, Toribio PARAS, PA-C  metoprolol  succinate (TOPROL -XL) 25 MG 24 hr tablet Take 0.5 tablets (12.5 mg total) by mouth daily. 05/06/22   Carlin Delon BROCKS, NP  Multiple Vitamins-Minerals (ONE-A-DAY PROACTIVE 65+) TABS Take 1 tablet by mouth daily with breakfast.    [provider]  Multiple Vitamins-Minerals (PRESERVISION AREDS) CAPS Take 1 capsule by  mouth 2 (two) times daily.    [provider]  pantoprazole  (PROTONIX ) 40 MG tablet Take 1 tablet (40 mg total) by mouth daily. 05/22/20   Angiulli, Toribio PARAS, PA-C  Polyethyl Glyc-Propyl Glyc PF (SYSTANE ULTRA PF) 0.4-0.3 % SOLN Place 1 drop into both eyes at bedtime.    [provider]  Probiotic Product (VSL#3 PO) Take 1 capsule by mouth in the morning.    [provider]  Vitamin D3 (VITAMIN D ) 25 MCG tablet Take  1 tablet (1,000 Units total) by mouth daily. 05/21/20   Angiulli, Toribio PARAS, PA-C  warfarin (COUMADIN ) 5 MG tablet Take 1 tablet (5 mg total) by mouth daily. 05/22/20 11/24/22  Angiulli, Toribio PARAS, PA-C   Critical care time:   The patient is critically ill with multiple organ system failure and requires high complexity decision making for assessment and support, frequent evaluation and titration of therapies, advanced monitoring, review of radiographic studies and interpretation of complex data.   Critical Care Time devoted to patient care services, exclusive of separately billable procedures, described in this note is 39 minutes.  Corean CHRISTELLA Coryn Mosso, PA-C [facility] 03/09/2024 2:58 AM  Please see [email or web address] for pager details.  From 7A-7P if no response, please call [phone] After hours, please call Ema [phone]    [1]  Allergies Allergen Reactions   Pradaxa [Dabigatran Etexilate Mesylate] Other (See Comments)    INTERNAL BLEEDING   Clindamycin/Lincomycin Other (See Comments)    PT STATES HER DOCTOR TOLD HER NOT TO TAKE CLINDAMYCIN BECAUSE SHE GOT C-DIFF AFTER TAKING AMPICILLIN- tolerated in 2022 (per spouse)   Latex Other (See Comments)    Blisters    Ampicillin Other (See Comments)    C. Diff after taking ampicillin - tolerated in 2022 (per husband) Pt has received cephalosporins in 2013, 2014, 2017, 2019, 2022    Sulfamethoxazole Diarrhea

## 2024-03-09 NOTE — Progress Notes (Signed)
 Breathing better, voice remains poor No stridor, crackles on right base  Continue steroids and Unasyn -Hold warfarin just in case procedures required ENT following for supraglottic mass/edema.  Harden ROCKFORD Jude MD

## 2024-03-09 NOTE — Progress Notes (Signed)
 eLink Physician-Brief Progress Note Patient Name: [name] DOB: Nov 06, 1933 MRN: [id number]   Date of Service  03/09/2024  HPI/Events of Note  Patient with recent upper respiratory illness who developed supra-glottic edema with potential for airway compromise, admitted to ICU at the request of ENT for close airway monitoring while they receive treatment for airway edema.  eICU Interventions  New Patient Evaluation.        Marja Adderley U Samaiya Awadallah 03/09/2024, 6:44 AM

## 2024-03-09 NOTE — Consult Note (Signed)
 Reason for Consult: Airway swelling Referring Physician: Dr. Ellouise Ivanoff [name] is an 89 y.o. female.  HPI: History of a few days of a difficulty with swallowing and some other sore throat.  She has had an upper respiratory infection for for 5 days.  For the last 1-1/2 days she has had the more increased symptoms of sounding like her voice is muffled slightly and the difficulty with swallowing.  As of tonight she had difficulty swallowing solids but she was able to eat breakfast this morning.  She has not had this problem previously.  She is not having any stridor and not had any actual respiratory difficulty.  Her CT scan shows a swelling of the area of the arytenoids and concern was for a mass.  Past Medical History:  Diagnosis Date   Arthritis    fingers, hips, feet, ankles (11/10/2012)   Asthma    Atrial fibrillation (HCC)    CHRONIC COUMADIN    Breast cancer (HCC) 1990's   cancer on one side; precancerous tissue on the other (11/10/2012)   Chronic bronchitis (HCC)    multiple times; not in a long time (11/10/2012   Depression    GERD (gastroesophageal reflux disease)    Hearing impaired    Hypertension    Irritable bowel    Lymphedema    HX OF - IN LEFT ARM--NO NEEDLES OR B/P'S LEFT ARM   Macular degeneration    BEGINNINGS OF MACULAR DEGENERATION   Osteoporosis    Pneumonia    multiple times; not in a long time (11/10/2012)   Recurrent UTI    Sleep apnea    dx'd w/it; don't wear mask or anything (11/10/2012)   Stroke (HCC) ~ 2005   HX OF TIA-NO RESIDUAL PROBLEM--PARALYSIS RT SIDE FACE /PT'S MOUTH DROOPS-AND LOSS OF HEARING RT EAR --SINCE EAR SURGERY AS A CHILD    UTI (lower urinary tract infection)    FREQUENT    Past Surgical History:  Procedure Laterality Date   BREAST BIOPSY Bilateral 1990's   Dacryocystorhinostomy  02/18/2016   INNER EAR SURGERY Right    MULTIPLE EAR SURGERIES,   INTRAMEDULLARY (IM) NAIL INTERTROCHANTERIC Left 04/23/2020   Procedure:  INTRAMEDULLARY (IM) NAIL INTERTROCHANTRIC;  Surgeon: Carrie Carrie SQUIBB, MD;  Location: [facility];  Service: Orthopedics;  Laterality: Left;   JOINT REPLACEMENT     KNEE ARTHROSCOPY  04/07/2012   Procedure: ARTHROSCOPY KNEE;  Surgeon: Carrie Carrie Moan, MD;  Location: [facility];  Service: Orthopedics;  Laterality: Left;  WITH SYNOVECTOMY   MASTECTOMY Bilateral 1990's   MASTOIDECTOMY Right 1942   ORIF FEMUR FRACTURE Left 08/08/2017   Procedure: OPEN REDUCTION INTERNAL FIXATION (ORIF) DISTAL FEMUR FRACTURE;  Surgeon: Carrie Carrie SQUIBB, MD;  Location: [facility];  Service: Orthopedics;  Laterality: Left;   TONSILLECTOMY     TOTAL KNEE ARTHROPLASTY  09/20/2011   LEFT TOTAL KNEE ARTHROPLASTY;  Surgeon: Carrie Carrie Moan, MD;  Location: [facility];  Service: Orthopedics;  Laterality: Left;   TOTAL KNEE ARTHROPLASTY  2006    Family History  Problem Relation Age of Onset   CAD Mother    Stroke Mother    CAD Father     Social History:  reports that she has never smoked. She has never used smokeless tobacco. She reports that she does not drink alcohol  and does not use drugs.  Allergies: Allergies[1]   Results for orders placed or performed during the hospital encounter of 03/08/24 (from the past 48 hours)  Urinalysis, w/  Reflex to Culture (Infection Suspected) -Urine, Clean Catch     Status: Abnormal   Collection Time: 03/08/24  8:11 PM  Result Value Ref Range   Specimen Source URINE, CLEAN CATCH    Color, Urine AMBER (A) YELLOW    Comment: BIOCHEMICALS MAY BE AFFECTED BY COLOR   APPearance CLOUDY (A) CLEAR   Specific Gravity, Urine 1.009 1.005 - 1.030   pH 8.0 5.0 - 8.0   Glucose, UA NEGATIVE NEGATIVE mg/dL   Hgb urine dipstick NEGATIVE NEGATIVE   Bilirubin Urine NEGATIVE NEGATIVE   Ketones, ur NEGATIVE NEGATIVE mg/dL   Protein, ur 899 (A) NEGATIVE mg/dL   Nitrite POSITIVE (A) NEGATIVE   Leukocytes,Ua LARGE (A) NEGATIVE   RBC / HPF 0-5 0 - 5 RBC/hpf   WBC, UA >50 0 - 5 WBC/hpf    Comment:         Reflex urine culture not performed if WBC <=10, OR if Squamous epithelial cells >5. If Squamous epithelial cells >5 suggest recollection.    Bacteria, UA NONE SEEN NONE SEEN   Squamous Epithelial / HPF 0-5 0 - 5 /HPF   WBC Clumps PRESENT    Amorphous Crystal PRESENT    Non Squamous Epithelial 0-5 (A) NONE SEEN    Comment: Performed at [facility], [address] [place], [place] [address]  Resp panel by RT-PCR (RSV, Flu A&B, Covid) Anterior Nasal Swab     Status: None   Collection Time: 03/08/24  8:31 PM   Specimen: Anterior Nasal Swab  Result Value Ref Range   SARS Coronavirus 2 by RT PCR NEGATIVE NEGATIVE   Influenza A by PCR NEGATIVE NEGATIVE   Influenza B by PCR NEGATIVE NEGATIVE    Comment: (NOTE) The Xpert Xpress SARS-CoV-2/FLU/RSV plus assay is intended as an aid in the diagnosis of influenza from Nasopharyngeal swab specimens and should not be used as a sole basis for treatment. Nasal washings and aspirates are unacceptable for Xpert Xpress SARS-CoV-2/FLU/RSV testing.  Fact Sheet for Patients: bloggercourse.com  Fact Sheet for Healthcare Providers: seriousbroker.it  This test is not yet approved or cleared by the [place]  FDA and has been authorized for detection and/or diagnosis of SARS-CoV-2 by FDA under an Emergency Use Authorization (EUA). This EUA will remain in effect (meaning this test can be used) for the duration of the COVID-19 declaration under Section 564(b)(1) of the Act, 21 U.S.C. section 360bbb-3(b)(1), unless the authorization is terminated or revoked.     Resp Syncytial Virus by PCR NEGATIVE NEGATIVE    Comment: (NOTE) Fact Sheet for Patients: bloggercourse.com  Fact Sheet for Healthcare Providers: seriousbroker.it  This test is not yet approved or cleared by the [place]  FDA and has been authorized for detection and/or  diagnosis of SARS-CoV-2 by FDA under an Emergency Use Authorization (EUA). This EUA will remain in effect (meaning this test can be used) for the duration of the COVID-19 declaration under Section 564(b)(1) of the Act, 21 U.S.C. section 360bbb-3(b)(1), unless the authorization is terminated or revoked.  Performed at [facility], [address], [place], [place] [address]   CBC with Differential     Status: Abnormal   Collection Time: 03/08/24  8:37 PM  Result Value Ref Range   WBC 9.3 4.0 - 10.5 K/uL   RBC 3.56 (L) 3.87 - 5.11 MIL/uL   Hemoglobin 10.1 (L) 12.0 - 15.0 g/dL   HCT 68.0 (L) 63.9 - 53.9 %   MCV 89.6 80.0 - 100.0 fL  MCH 28.4 26.0 - 34.0 pg   MCHC 31.7 30.0 - 36.0 g/dL   RDW 86.5 88.4 - 84.4 %   Platelets 248 150 - 400 K/uL   nRBC 0.0 0.0 - 0.2 %   Neutrophils Relative % 76 %   Neutro Abs 7.1 1.7 - 7.7 K/uL   Lymphocytes Relative 16 %   Lymphs Abs 1.4 0.7 - 4.0 K/uL   Monocytes Relative 6 %   Monocytes Absolute 0.6 0.1 - 1.0 K/uL   Eosinophils Relative 1 %   Eosinophils Absolute 0.1 0.0 - 0.5 K/uL   Basophils Relative 1 %   Basophils Absolute 0.1 0.0 - 0.1 K/uL   Immature Granulocytes 0 %   Abs Immature Granulocytes 0.03 0.00 - 0.07 K/uL    Comment: Performed at [facility], [address] [place], [place] [address]  Comprehensive metabolic panel     Status: Abnormal   Collection Time: 03/08/24  8:37 PM  Result Value Ref Range   Sodium 136 135 - 145 mmol/L   Potassium 3.9 3.5 - 5.1 mmol/L   Chloride 103 98 - 111 mmol/L   CO2 22 22 - 32 mmol/L   Glucose, Bld 117 (H) 70 - 99 mg/dL    Comment: Glucose reference range applies only to samples taken after fasting for at least 8 hours.   BUN 28 (H) 8 - 23 mg/dL   Creatinine, Ser 8.48 (H) 0.44 - 1.00 mg/dL   Calcium  8.5 (L) 8.9 - 10.3 mg/dL   Total Protein 7.1 6.5 - 8.1 g/dL   Albumin  3.6 3.5 - 5.0 g/dL   AST 18 15 - 41 U/L   ALT 15 0 - 44 U/L   Alkaline Phosphatase 138 (H) 38 - 126 U/L   Total  Bilirubin 0.9 0.0 - 1.2 mg/dL   GFR, Estimated 33 (L) >60 mL/min    Comment: (NOTE) Calculated using the CKD-EPI Creatinine Equation (2021)    Anion gap 11 5 - 15    Comment: Performed at [facility], [address] [place], [place] [address]  Troponin I (High Sensitivity)     Status: None   Collection Time: 03/08/24  8:37 PM  Result Value Ref Range   Troponin I (High Sensitivity) 14 <18 ng/L    Comment: (NOTE) Elevated high sensitivity troponin I (hsTnI) values and significant  changes across serial measurements may suggest ACS but many other  chronic and acute conditions are known to elevate hsTnI results.  Refer to the Links section for chest pain algorithms and additional  guidance. Performed at [facility], [address] [place], [place] [address]   Brain natriuretic peptide     Status: Abnormal   Collection Time: 03/08/24  8:37 PM  Result Value Ref Range   B Natriuretic Peptide 425.2 (H) 0.0 - 100.0 pg/mL    Comment: Performed at [facility], [address], [place], [place] [address]  Troponin I (High Sensitivity)     Status: None   Collection Time: 03/08/24 11:23 PM  Result Value Ref Range   Troponin I (High Sensitivity) 15 <18 ng/L    Comment: (NOTE) Elevated high sensitivity troponin I (hsTnI) values and significant  changes across serial measurements may suggest ACS but many other  chronic and acute conditions are known to elevate hsTnI results.  Refer to the Links section for chest pain algorithms and additional  guidance. Performed at [facility], [address], [place],   [address]     CT Soft Tissue Neck W Contrast Result Date: 03/09/2024 EXAM: CT NECK WITH CONTRAST 03/08/2024 11:10:11 PM TECHNIQUE: CT of the neck was performed with the administration of intravenous contrast. Multiplanar reformatted images are provided for review. Automated exposure control, iterative reconstruction, and/or weight based adjustment of  the mA/kV was utilized to reduce the radiation dose to as low as reasonably achievable. COMPARISON: None available. CLINICAL HISTORY: Difficulty tolerating secretions FINDINGS: AERODIGESTIVE TRACT: Approximately 1.9 x 0.8 cm area of hyperdensity that appears to be located in the region of the anterior hypopharyngeal wall on series 503 image 56 above the cricoid cartilage. Extensive surrounding edema that extends posteriorly into the hypopharynx and more superiorly to the right aryepiglottic fold. Associated mild narrowing of the airway which is patent and probable mass effect on the esophagus. SALIVARY GLANDS: The parotid and submandibular glands are unremarkable. THYROID : Unremarkable. LYMPH NODES: No suspicious cervical lymphadenopathy. BRAIN, ORBITS, SINUSES AND MASTOIDS: No acute abnormality. LUNGS AND MEDIASTINUM: No acute abnormality. BONES: No focal bone abnormality.  Severe degenerative changes. Findings and recommendations discussed with [name] via telephone at 12:03 AM. IMPRESSION: 1. Supraglottic laryngeal and hypopharyngeal edema with possible 1.9 cm hemorrhage versus mass in this region as detailed above. Findings are indeterminant but could be secondary to malignancy, infection, or trauma. Recommend ENT consultation and direct visualization. Electronically signed by: Gilmore Molt 03/09/2024 12:10 AM EST RP Workstation: [id number]   CT Chest W Contrast Result Date: 03/08/2024 EXAM: CT CHEST WITH CONTRAST 03/08/2024 11:10:11 PM TECHNIQUE: CT of the chest was performed with the administration of 75 mL of iohexol (OMNIPAQUE) 350 MG/ML injection. Multiplanar reformatted images are provided for review. Automated exposure control, iterative reconstruction, and/or weight based adjustment of the mA/kV was utilized to reduce the radiation dose to as low as reasonably achievable. COMPARISON: 11/04/2015 CLINICAL HISTORY: sob, c/f aspiration FINDINGS: MEDIASTINUM: Moderate cardiomegaly. Moderate  3-vessel coronary atherosclerosis. Thoracic Aortic atherosclerosis. Pericardium is unremarkable. The central airways are clear. LYMPH NODES: No mediastinal, hilar or axillary lymphadenopathy. LUNGS AND PLEURA: Scattered atelectasis/scarring, chronic. No pulmonary edema. No pleural effusion or pneumothorax. SOFT TISSUES/BONES: Mildly exaggerated upper and mid thoracic kyphosis. No acute abnormality of the soft tissues. UPPER ABDOMEN: Limited images of the upper abdomen demonstrates no acute abnormality. IMPRESSION: 1. No acute cardiopulmonary process. Electronically signed by: Pinkie Pebbles MD 03/08/2024 11:19 PM EST RP Workstation: [id number]   CT Head Wo Contrast Result Date: 03/08/2024 EXAM: CT HEAD WITHOUT 03/08/2024 11:10:11 PM TECHNIQUE: CT of the head was performed without the administration of intravenous contrast. Automated exposure control, iterative reconstruction, and/or weight based adjustment of the mA/kV was utilized to reduce the radiation dose to as low as reasonably achievable. COMPARISON: 08/06/2017 CLINICAL HISTORY: difficulty tolerating secretions FINDINGS: BRAIN AND VENTRICLES: No acute intracranial hemorrhage. No mass effect or midline shift. No extra-axial fluid collection. No evidence of acute infarct. No hydrocephalus. Age-related atrophy. Old left cerebellar infarct. Moderate periventricular and deep cerebral white matter disease. Old lacunar infarct within the right basal ganglia. Intracranial atherosclerosis. ORBITS: Status post bilateral lens replacement. SINUSES AND MASTOIDS: Small air-fluid level within the right maxillary sinus. Postsurgical changes of the right mastoid. SOFT TISSUES AND SKULL: Left cochlear implant with streak artifact. No acute skull fracture. No acute soft tissue abnormality. IMPRESSION: 1. No acute intracranial abnormality. Electronically signed by: Pinkie Pebbles MD 03/08/2024 11:17 PM EST RP Workstation: [id number]   DG Chest 2 View Result Date:  03/08/2024 EXAM: 2 VIEW(S) XRAY OF THE CHEST 03/08/2024 08:57:00 PM COMPARISON:  03/06/2021. CLINICAL HISTORY: congestion FINDINGS: LUNGS AND PLEURA: Vascular congestion. No overt edema. No focal pulmonary opacity. No pleural effusion. No pneumothorax. HEART AND MEDIASTINUM: Cardiomegaly. Aortic atherosclerosis. BONES AND SOFT TISSUES: No acute osseous abnormality. IMPRESSION: 1. Vascular congestion without overt edema. 2. Cardiomegaly with aortic atherosclerosis. Electronically signed by: Carrie Crease MD 03/08/2024 09:05 PM EST RP Workstation: [id number]    ROS Blood pressure (!) 180/77, pulse 76, temperature 97.7 F (36.5 C), temperature source Oral, resp. rate (!) 21, height 5' 4 (1.626 m), weight 72 kg, SpO2 94%. Physical Exam Constitutional:      Appearance: Normal appearance.  HENT:     Head: Normocephalic and atraumatic.     Right Ear: Tympanic membrane is without lesions and middle ear aerated, ear canal and external ear normal.     Left Ear: Tympanic membrane is without lesions and middle ear aerated, ear canal and external ear normal.     Nose: Nose without deviation of septum.  Turbinates with mild hypertrophy, No significant swelling or masses.     Oral cavity/oropharynx: Mucous membranes are moist. No lesions or masses    Larynx: See below    Eyes:     Extraocular Movements: Extraocular movements intact.     Conjunctiva/sclera: Conjunctivae normal.     Pupils: Pupils are equal, round, and reactive to light.  Cardiovascular:     Rate and Rhythm: Normal rate.  Pulmonary:     Effort: Pulmonary effort is normal.  Musculoskeletal:     Cervical back: Normal range of motion and neck supple. No rigidity.  Lymphadenopathy:     Cervical: She is tender left greater than right on palpation of the jugulodigastric and Level 3 zone.  No cervical adenopathy or masses.salivary glands without lesions. .     Salivary glands- no mass or swelling Neurological:     Mental Status: He is alert. CN  2-12 intact. No nystagmus   Flexible fibroptic laryngoscopy  Patient was informed of risks, benefits, and options. All questions answered. Consent obtained.   The scope was passed through the nose and tracked into the nasopharynx. The nasopharynx without lesions or masses. The scope was positioned over the base of tongue and epiglottis.  There is a lot of retained secretions overlying the arytenoids and glottis.  It clears occasionally.  The epiglottis itself looks normal.  The vocal cords are visualized by bringing the scope closer to the larynx but the arytenoids do overhanging the glottis creating some airway narrowing in the supraglottic region.  This swelling looks watery right there consistent with swelling not mass.  The vocal cords look like they move normally and they do not have any lesions.  She is not having any stridor.  The subglottis has minimal visualization but no lesions identified. The scope was removed without difficulty and patient tolerated well.     Assessment/Plan: Supraglottic edema-is not clear what the etiology of this is but she definitely has some swelling that overhangs the glottis.  She still has an adequate airway.  She is having no stridor.  I think she needs steroids and antibiotics and an admission to intensive care unit or stepdown.  Hopefully she will not require any further intervention.  I did discuss airway management if it becomes necessary including intubation and possible trach.  [name] 03/09/2024, 12:43 AM        [1]  Allergies Allergen Reactions   Pradaxa [Dabigatran Etexilate Mesylate] Other (See Comments)    INTERNAL BLEEDING   Clindamycin/Lincomycin Other (See  Comments)    PT STATES HER DOCTOR TOLD HER NOT TO TAKE CLINDAMYCIN BECAUSE SHE GOT C-DIFF AFTER TAKING AMPICILLIN- tolerated in 2022 (per spouse)   Latex Other (See Comments)    Blisters    Ampicillin Other (See Comments)    C. Diff after taking ampicillin - tolerated in  2022 (per husband) Pt has received cephalosporins in 2013, 2014, 2017, 2019, 2022    Sulfamethoxazole Diarrhea

## 2024-03-09 NOTE — Progress Notes (Signed)
 Patient ID: [name], female   DOB: 08-01-1933, 89 y.o.   MRN: [id number]  She is better this am.  I would recommend continuing the steroids and antibiotics for probably another 24 hours and see how she is feeling.  There probably will be further workup needed as an outpatient but for this acute episode we just need to get her back to her baseline and then evaluate for possible additional issues with a potential laryngeal mass.

## 2024-03-09 NOTE — Progress Notes (Signed)
°   03/09/24 0233  Spiritual Encounters  Type of Visit Initial  Care provided to: Patient  Reason for visit Routine spiritual support  OnCall Visit Yes   Visited with patient. Discuss family dynamics. Offered support.

## 2024-03-09 NOTE — Plan of Care (Signed)
°  Problem: SLP Dysphagia Goals Goal: Misc Dysphagia Goal Flowsheets (Taken 03/09/2024 1429) Misc Dysphagia Goal: Pt will tolerate the least restrictive diet with no overt or subtle s/s of aspiration or decline in pulmonary status.

## 2024-03-10 ENCOUNTER — Inpatient Hospital Stay (HOSPITAL_COMMUNITY)

## 2024-03-10 DIAGNOSIS — J387 Other diseases of larynx: Secondary | ICD-10-CM | POA: Diagnosis present

## 2024-03-10 LAB — BASIC METABOLIC PANEL WITH GFR
Anion gap: 9 (ref 5–15)
BUN: 31 mg/dL — ABNORMAL HIGH (ref 8–23)
CO2: 22 mmol/L (ref 22–32)
Calcium: 8.5 mg/dL — ABNORMAL LOW (ref 8.9–10.3)
Chloride: 106 mmol/L (ref 98–111)
Creatinine, Ser: 1.55 mg/dL — ABNORMAL HIGH (ref 0.44–1.00)
GFR, Estimated: 32 mL/min — ABNORMAL LOW (ref >60)
Glucose, Bld: 127 mg/dL — ABNORMAL HIGH (ref 70–99)
Potassium: 4 mmol/L (ref 3.5–5.1)
Sodium: 137 mmol/L (ref 135–145)

## 2024-03-10 LAB — CBC WITH DIFFERENTIAL/PLATELET
Abs Immature Granulocytes: 0.06 K/uL (ref 0.00–0.07)
Basophils Absolute: 0 K/uL (ref 0.0–0.1)
Basophils Relative: 0 %
Eosinophils Absolute: 0 K/uL (ref 0.0–0.5)
Eosinophils Relative: 0 %
HCT: 30.2 % — ABNORMAL LOW (ref 36.0–46.0)
Hemoglobin: 10 g/dL — ABNORMAL LOW (ref 12.0–15.0)
Immature Granulocytes: 1 %
Lymphocytes Relative: 8 %
Lymphs Abs: 1 K/uL (ref 0.7–4.0)
MCH: 28.7 pg (ref 26.0–34.0)
MCHC: 33.1 g/dL (ref 30.0–36.0)
MCV: 86.8 fL (ref 80.0–100.0)
Monocytes Absolute: 0.3 K/uL (ref 0.1–1.0)
Monocytes Relative: 3 %
Neutro Abs: 10.7 K/uL — ABNORMAL HIGH (ref 1.7–7.7)
Neutrophils Relative %: 88 %
Platelets: 253 K/uL (ref 150–400)
RBC: 3.48 MIL/uL — ABNORMAL LOW (ref 3.87–5.11)
RDW: 13.6 % (ref 11.5–15.5)
WBC: 12.1 K/uL — ABNORMAL HIGH (ref 4.0–10.5)
nRBC: 0 % (ref 0.0–0.2)

## 2024-03-10 LAB — GLUCOSE, CAPILLARY
Glucose-Capillary: 137 mg/dL — ABNORMAL HIGH (ref 70–99)
Glucose-Capillary: 138 mg/dL — ABNORMAL HIGH (ref 70–99)
Glucose-Capillary: 182 mg/dL — ABNORMAL HIGH (ref 70–99)
Glucose-Capillary: 207 mg/dL — ABNORMAL HIGH (ref 70–99)
Glucose-Capillary: 210 mg/dL — ABNORMAL HIGH (ref 70–99)
Glucose-Capillary: 239 mg/dL — ABNORMAL HIGH (ref 70–99)

## 2024-03-10 MED ORDER — WARFARIN SODIUM 2.5 MG PO TABS
2.5000 mg | ORAL_TABLET | Freq: Once | ORAL | Status: AC
  Administered 2024-03-10: 2.5 mg via ORAL
  Filled 2024-03-10: qty 1

## 2024-03-10 MED ORDER — METOPROLOL SUCCINATE ER 25 MG PO TB24
12.5000 mg | ORAL_TABLET | Freq: Every day | ORAL | Status: DC
  Administered 2024-03-10 – 2024-03-12 (×3): 12.5 mg via ORAL
  Filled 2024-03-10 (×3): qty 1

## 2024-03-10 MED ORDER — WARFARIN - PHARMACIST DOSING INPATIENT: Freq: Every day | Status: DC

## 2024-03-10 NOTE — Progress Notes (Addendum)
 NAME:  [name], MRN:  [id number], DOB:  1934-02-11, LOS: 1 ADMISSION DATE:  03/08/2024 CONSULTATION DATE:  03/09/2024 REFERRING MD:  Arlee - EDP, CHIEF COMPLAINT:  Difficulty managing secretions, flu-like symptoms  History of Present Illness:  89 year old woman who presented to [facility] ED 12/11 from UC for difficulty managing secretions. PMHx significant for  Patient presented to [facility] 12/11 for flu-like symptoms x 4 days. Endorses subjective fevers/chills, SOB, congestion, sore throat, dysphagia, cough productive of mucous. Also endorses changes to voice, difficulty speaking and difficulty managing secretions/drooling. Initial UC workup included COVID/Flu/strep testing (negative). Recommended presentation to ER for further care.  On ED arrival, patient was afebrile with HR 63, BP 120/84, RR 20, SpO2 95%. Labs were notable for WBC 9.3, Hgb 10.1, Plt 248. Na 136, K 3.9, CO2 22, Cr 1.51 (baseline 1.1-1.2). CT Head NAICA, CTA Head no LVO, CT Chest unremarkable. CT Soft Tissue Neck demonstrated supraglottic laryngeal and hypopharyngeal edema with possible 1.9 cm hemorrhage versus mass. ENT was consulted with recommendation for scope to evaluate/localize laryngeal swelling/?mass. Scope demonstrated supraglottic edema for which antibiotics and steroids were recommended with close monitoring in the ICU.  PCCM consulted for ICU admission.  Pertinent Medical History:   Past Medical History:  Diagnosis Date   Arthritis    fingers, hips, feet, ankles (11/10/2012)   Asthma    Atrial fibrillation (HCC)    CHRONIC COUMADIN    Breast cancer (HCC) 1990's   cancer on one side; precancerous tissue on the other (11/10/2012)   Chronic bronchitis (HCC)    multiple times; not in a long time (11/10/2012   Depression    GERD (gastroesophageal reflux disease)    Hearing impaired    Hypertension    Irritable bowel    Lymphedema    HX OF - IN LEFT ARM--NO NEEDLES OR B/P'S LEFT ARM   Macular degeneration     BEGINNINGS OF MACULAR DEGENERATION   Osteoporosis    Pneumonia    multiple times; not in a long time (11/10/2012)   Recurrent UTI    Sleep apnea    dx'd w/it; don't wear mask or anything (11/10/2012)   Stroke (HCC) ~ 2005   HX OF TIA-NO RESIDUAL PROBLEM--PARALYSIS RT SIDE FACE /PT'S MOUTH DROOPS-AND LOSS OF HEARING RT EAR --SINCE EAR SURGERY AS A CHILD    UTI (lower urinary tract infection)    FREQUENT   Significant Hospital Events: Including procedures, antibiotic start and stop dates in addition to other pertinent events   12/11 - Presented to UC for flu-like symptoms x 4 days. Difficult controlling secretions, voice changes. CT Head NAICA, CT Soft Tissue Neck supraglottic laryngeal and hypopharyngeal edema with possible 1.9 cm hemorrhage versus mass. 12/12 - ENT consulted, request ICU admission for close monitoring.  Interim History / Subjective:   States voice improved. RN reports no aspiration with liquids Afebrile  Objective:  Blood pressure (!) 175/70, pulse 89, temperature 98.5 F (36.9 C), temperature source Oral, resp. rate 15, height 5' 4 (1.626 m), weight 78.7 kg, SpO2 93%.        Intake/Output Summary (Last 24 hours) at 03/10/2024 1020 Last data filed at 03/10/2024 0500 Gross per 24 hour  Intake 750.12 ml  Output 650 ml  Net 100.12 ml   Filed Weights   03/08/24 2019 03/09/24 0516 03/10/24 0352  Weight: 72 kg 77.6 kg 78.7 kg   Physical Examination: Elderly woman, lying in bed, no distress Voice improved No stridor, no accessory muscle use, clear breath sounds  bilateral S1-S2 regular. Alert, interactive, very hard of hearing, right facial palsy chronic  Labs show stable BUN/creatinine 31/1.5, mild leukocytosis due to steroids, stable anemia   Resolved Hospital Problem List:    Assessment & Plan:  Acute onset of supraglottic edema/mass - Ceftriaxone  and Decadron  - ENT consulted, following -she will need follow-up outpatient for laryngeal  mass -She can transfer out of ICU, she is able to manage secretions and barium swallow is pending today to decide about advancing diet -Husband requesting discharge but would recommend to monitor her in the hospital for another 24 to 48 hours  HTN Afib (on Coumadin ) CVA - Cardiac monitoring - Optimize electrolytes for K > 4, Mg > 2 - Holding warfarin, pending ENT direction on whether further procedures required  OSA - Supplemental O2 support, wean FiO2 for O2 sat > 90% - Does not wear mask at night   Recurrent UTI AKI on CKD stage 3B UA nitrite positive, +leuks. Urine Cx +E.coli, proteus. 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  -  speak slowly and clearly  Labs:  CBC: Recent Labs  Lab 03/08/24 2037 03/09/24 0207 03/10/24 0150  WBC 9.3 11.0* 12.1*  NEUTROABS 7.1  --  10.7*  HGB 10.1* 10.6* 10.0*  HCT 31.9* 32.2* 30.2*  MCV 89.6 89.2 86.8  PLT 248 242 253   Basic Metabolic Panel: Recent Labs  Lab 03/08/24 2037 03/09/24 0207 03/10/24 0150  NA 136 136 137  K 3.9 4.3 4.0  CL 103 104 106  CO2 22 22 22   GLUCOSE 117* 133* 127*  BUN 28* 27* 31*  CREATININE 1.51* 1.46* 1.55*  CALCIUM  8.5* 8.4* 8.5*  MG  --  1.6*  --   PHOS  --  3.3  --    GFR: Estimated Creatinine Clearance: 25.5 mL/min (A) (by C-G formula based on SCr of 1.55 mg/dL (H)). Recent Labs  Lab 03/08/24 2037 03/09/24 0207 03/10/24 0150  WBC 9.3 11.0* 12.1*   Liver Function Tests: Recent Labs  Lab 03/08/24 2037  AST 18  ALT 15  ALKPHOS 138*  BILITOT 0.9  PROT 7.1  ALBUMIN  3.6   No results for input(s): LIPASE, AMYLASE in the last 168 hours. No results for input(s): AMMONIA in the last 168 hours.  ABG:    Component Value Date/Time   PHART 7.408 08/08/2017 1624   PCO2ART 35.4 08/08/2017 1624    PO2ART 92.0 08/08/2017 1624   HCO3 22.3 08/08/2017 1624   TCO2 23 08/08/2017 1624   ACIDBASEDEF 2.0 08/08/2017 1624   O2SAT 97.0 08/08/2017 1624    Coagulation Profile: Recent Labs  Lab 03/09/24 0207  INR 2.1*    Cardiac Enzymes: No results for input(s): CKTOTAL, CKMB, CKMBINDEX, TROPONINI in the last 168 hours.  HbA1C: Hgb A1c MFr Bld  Date/Time Value Ref Range Status  04/28/2020 02:20 AM 5.6 4.8 - 5.6 % Final    Comment:    (NOTE) Pre diabetes:          5.7%-6.4%  Diabetes:              >6.4%  Glycemic control for   <7.0% adults with diabetes    CBG: Recent Labs  Lab 03/09/24 1542 03/09/24 1912 03/09/24 2301 03/10/24 0321 03/10/24 0749  GLUCAP 136* 155* 141* 137* 138*    Laural Eiland  ALONSO Staff, MD [facility] 03/10/2024 10:20 AM  Please see [email or web address] for pager details.  From 7A-7P if no response, please call [phone] After hours, please call ELink [phone]

## 2024-03-10 NOTE — Evaluation (Signed)
 Physical Therapy Evaluation Patient Details Name: Jacquelyn Shadrick MRN: [id number] DOB: 15-Feb-1934 Today's Date: 03/10/2024  History of Present Illness  Pt is an 89 y/o female presenting to the ED12/11 from UC with difficulty managing secretions and flu-like symptoms x4 days  PMHx:  afib, Breast CA, hearing impaired with cochlear implant, macular degen, HTN, lymphadema, osteoporosis, sleep apnea, stroke  Clinical Impression  Pt admitted with/for the problems stated above including decreased management of secretions.  Mobility was not quite at baseline, pt needing CG to min assist for STS and gait with the RW in the room..  Pt currently limited functionally due to the problems listed below.  (see problems list.)  Pt will benefit from PT to maximize function and safety to be able to get home safely with available assist of husband..         If plan is discharge home, recommend the following: A little help with walking and/or transfers;A little help with bathing/dressing/bathroom;Assistance with cooking/housework;Assist for transportation;Help with stairs or ramp for entrance   Can travel by private vehicle        Equipment Recommendations None recommended by PT  Recommendations for Other Services       Functional Status Assessment Patient has had a recent decline in their functional status and demonstrates the ability to make significant improvements in function in a reasonable and predictable amount of time.     Precautions / Restrictions Precautions Precautions: Fall Recall of Precautions/Restrictions: Intact      Mobility  Bed Mobility               General bed mobility comments: bed mobility not assessed    Transfers Overall transfer level: Needs assistance   Transfers: Sit to/from Stand Sit to Stand: Min assist           General transfer comment: min stability assist up and forward into the RW    Ambulation/Gait Ambulation/Gait assistance: Contact guard  assist Gait Distance (Feet): 30 Feet (x2 with RW) Assistive device: Rolling walker (2 wheels) Gait Pattern/deviations: Step-through pattern   Gait velocity interpretation: <1.31 ft/sec, indicative of household ambulator   General Gait Details: short, tentative steps, but generally stable  Stairs            Wheelchair Mobility     Tilt Bed    Modified Rankin (Stroke Patients Only)       Balance                                             Pertinent Vitals/Pain Pain Assessment Pain Assessment: No/denies pain    Home Living Family/patient expects to be discharged to:: Private residence Living Arrangements: Spouse/significant other Available Help at Discharge: Family;Available 24 hours/day Type of Home: House Home Access: Stairs to enter Entrance Stairs-Rails: Right;Left     Home Layout: One level Home Equipment: Agricultural Consultant (2 wheels)      Prior Function Prior Level of Function : Independent/Modified Independent (in the home)             Mobility Comments: supervision, pt get out with husband regularly. ADLs Comments: Independent with ADL's     Extremity/Trunk Assessment   Upper Extremity Assessment Upper Extremity Assessment: Overall WFL for tasks assessed    Lower Extremity Assessment Lower Extremity Assessment: Generalized weakness;Overall [facility] for tasks assessed    Cervical / Trunk Assessment Cervical / Trunk Assessment:  Kyphotic  Communication   Communication Factors Affecting Communication: Hearing impaired;Reduced clarity of speech    Cognition Arousal: Alert Behavior During Therapy: WFL for tasks assessed/performed   PT - Cognitive impairments: Difficult to assess Difficult to assess due to: Hard of hearing/deaf, Impaired communication                       Following commands: Intact       Cueing Cueing Techniques: Verbal cues     General Comments General comments (skin integrity, edema, etc.):  vss    Exercises     Assessment/Plan    PT Assessment All further PT needs can be met in the next venue of care  PT Problem List Decreased strength;Decreased activity tolerance;Decreased balance;Decreased mobility       PT Treatment Interventions      PT Goals (Current goals can be found in the Care Plan section)  Acute Rehab PT Goals Patient Stated Goal: to leave today PT Goal Formulation: With patient Time For Goal Achievement: 03/17/24 Potential to Achieve Goals: Good    Frequency       Co-evaluation               AM-PAC PT 6 Clicks Mobility  Outcome Measure Help needed turning from your back to your side while in a flat bed without using bedrails?: A Little Help needed moving from lying on your back to sitting on the side of a flat bed without using bedrails?: A Little Help needed moving to and from a bed to a chair (including a wheelchair)?: A Little Help needed standing up from a chair using your arms (e.g., wheelchair or bedside chair)?: A Little Help needed to walk in hospital room?: A Little Help needed climbing 3-5 steps with a railing? : A Lot 6 Click Score: 17    End of Session   Activity Tolerance: Patient tolerated treatment well;Patient limited by fatigue Patient left: in chair;with call bell/phone within reach;with family/visitor present Nurse Communication: Mobility status PT Visit Diagnosis: Unsteadiness on feet (R26.81);Other abnormalities of gait and mobility (R26.89)    Time: 1033-1100 PT Time Calculation (min) (ACUTE ONLY): 27 min   Charges:   PT Evaluation $PT Eval Moderate Complexity: 1 Mod PT Treatments $Gait Training: 8-22 mins PT General Charges $$ ACUTE PT VISIT: 1 Visit         03/10/2024  India HERO., PT Acute Rehabilitation Services [phone]  (office)  Vinie GAILS Shemia Bevel 03/10/2024, 3:40 PM

## 2024-03-10 NOTE — Procedures (Signed)
 Modified Barium Swallow Study  Patient Details  Name: [name] MRN: [id number] Date of Birth: 09/03/1933  Today's Date: 03/10/2024  Modified Barium Swallow completed.  Full report located under Chart Review in the Imaging Section.  History of Present Illness [name] presented to [facility] on 03/09/24 from urgent care with fevers/chills, SOB, congestion, sore throat, dysphagia, cough productive of mucous, changes to voice, difficulty speaking and difficulty managing secretions/drooling. CT Head and CT Chest negative for acute processes. CT Neck revealed supraglottic laryngeal and hypopharyngeal edema with possible 1.9 cm  hemorrhage versus mass in this region. Pt scoped by ENT who's assessment is consistent with supraglottic edema with unclear etiology. Pt started on antibiotics and steriods. SLP consulted for clinical swallow evaluation.   Clinical Impression Patient presents with a pharyngoesophageal dysphagia with esophageal component likely chronic as per this MBS. Oral phase appears WFL aside from patient's inability to masticate graham cracker. Appearance of prominent cricopharyngeal bar noted which resulted in incomplete transit of bolus past bar and retrograde movement of barium that remained above the cricopharyngeal bar back up through the PES. This occured with all bolus consistencies and resulted in barium transiting back into pyriform sinus as well as instances of penetration above the vocal cords with thin, nectar thick, honey thick and puree that did not clear (PAS 3) and penetration to the vocal cords (PAS 5) with thin and nectar thick liquids. No instances of aspiration observed during this study. Patient indicated swallowing felt easier with head turn to right. Retrograde movement of barium occured regardless of any swallow strategy. Most effective compensation was a throat clear after initial swallow, followed by a second swallow. Patient and spouse educated on these recommendations  and both verbalized understanding and agreement. Factors that may increase risk of adverse event in presence of aspiration [name] 2021): Frail or deconditioned;Limited mobility;Poor general health and/or compromised immunity  DIGEST Swallow Severity Rating*  Safety: 2  Efficiency: 1  Overall Pharyngeal Swallow Severity: 2 1: mild; 2: moderate; 3: severe; 4: profound  *The Dynamic Imaging Grade of Swallowing Toxicity is standardized for the head and neck cancer population, however, demonstrates promising clinical applications across populations to standardize the clinical rating of pharyngeal swallow safety and severity.    Swallow Evaluation Recommendations Recommendations: PO diet PO Diet Recommendation: Dysphagia 1 (Pureed);Thin liquids (Level 0) Liquid Administration via: Cup;Straw Medication Administration: Crushed with puree Supervision: Patient able to self-feed Swallowing strategies  : Slow rate;Small bites/sips;Multiple dry swallows after each bite/sip;Clear throat intermittently;Hard cough after swallowing Postural changes: Position pt fully upright for meals;Stay upright 30-60 min after meals Oral care recommendations: Oral care BID (2x/day)     Carrie IVAR Blase, MA, CCC-SLP Speech Therapy  03/10/2024,3:08 PM

## 2024-03-10 NOTE — Progress Notes (Signed)
 PHARMACY - ANTICOAGULATION CONSULT NOTE  Pharmacy Consult:  Coumadin  Indication: atrial fibrillation  Allergies[1]  Patient Measurements: Height: 5' 4 (162.6 cm) Weight: 78.7 kg (173 lb 8 oz) IBW/kg (Calculated) : 54.7 HEPARIN DW (KG): 69.5  Vital Signs: Temp: 98.6 F (37 C) (12/13 1200) Temp Source: Axillary (12/13 1200) BP: 175/70 (12/13 0900) Pulse Rate: 82 (12/13 1000)  Labs: Recent Labs    03/08/24 2037 03/08/24 2323 03/09/24 0207 03/10/24 0150  HGB 10.1*  --  10.6* 10.0*  HCT 31.9*  --  32.2* 30.2*  PLT 248  --  242 253  LABPROT  --   --  25.0*  --   INR  --   --  2.1*  --   CREATININE 1.51*  --  1.46* 1.55*  TROPONINIHS 14 15  --   --     Estimated Creatinine Clearance: 25.5 mL/min (A) (by C-G formula based on SCr of 1.55 mg/dL (H)).   Medical History: Past Medical History:  Diagnosis Date   Arthritis    fingers, hips, feet, ankles (11/10/2012)   Asthma    Atrial fibrillation (HCC)    CHRONIC COUMADIN    Breast cancer (HCC) 1990's   cancer on one side; precancerous tissue on the other (11/10/2012)   Chronic bronchitis (HCC)    multiple times; not in a long time (11/10/2012   Depression    GERD (gastroesophageal reflux disease)    Hearing impaired    Hypertension    Irritable bowel    Lymphedema    HX OF - IN LEFT ARM--NO NEEDLES OR B/P'S LEFT ARM   Macular degeneration    BEGINNINGS OF MACULAR DEGENERATION   Osteoporosis    Pneumonia    multiple times; not in a long time (11/10/2012)   Recurrent UTI    Sleep apnea    dx'd w/it; don't wear mask or anything (11/10/2012)   Stroke (HCC) ~ 2005   HX OF TIA-NO RESIDUAL PROBLEM--PARALYSIS RT SIDE FACE /PT'S MOUTH DROOPS-AND LOSS OF HEARING RT EAR --SINCE EAR SURGERY AS A CHILD    UTI (lower urinary tract infection)    FREQUENT      Assessment: 3 YOF presented with supraglottic laryngeal and hypopharyngeal edema.  Patient has a history of Afib on Coumadin  PTA.  She does not require  surgery, so Pharmacy has been consulted to resume Coumadin .  INR was therapeutic at 2.1 yesterday and expect it to trend down.  No bleeding reported.  Home regimen:  Coumadin  2.5mg  PO daily  Goal of Therapy:  INR 2-3 Monitor platelets by anticoagulation protocol: Yes   Plan:  Coumadin  2.5mg  PO today Daily PT / INR  Robie Oats D. Lendell, PharmD, BCPS, BCCCP 03/10/2024, 12:25 PM      [1]  Allergies Allergen Reactions   Pradaxa [Dabigatran Etexilate Mesylate] Other (See Comments)    INTERNAL BLEEDING   Clindamycin/Lincomycin Other (See Comments)    PT STATES HER DOCTOR TOLD HER NOT TO TAKE CLINDAMYCIN BECAUSE SHE GOT C-DIFF AFTER TAKING AMPICILLIN - tolerated in 2022 (per spouse)   Latex Other (See Comments)    Blisters    Ampicillin  Other (See Comments)    C. Diff after taking ampicillin  - tolerated in 2022 (per husband) Pt has received cephalosporins in 2013, 2014, 2017, 2019, 2022    Sulfamethoxazole Diarrhea

## 2024-03-10 NOTE — Progress Notes (Signed)
 Patient ID: [name], female   DOB: [birth year], 89 y.o.   MRN: [id number]  She is doing much better.  She has no further hot potato voice.  Her voice is much stronger and clear.  She has no stridor.  She is due to get a barium swallow today.  She is eating at the bedside currently.  She has no pain.   Dr. Jude wants to keep her another day for airway observation but she seems to be significantly improved.  She can go on a regular diet if the barium swallow goes well.  We will evaluate her larynx as an outpatient to see if there is any further intervention needed for any potential mass.

## 2024-03-11 ENCOUNTER — Inpatient Hospital Stay (HOSPITAL_COMMUNITY)

## 2024-03-11 DIAGNOSIS — N179 Acute kidney failure, unspecified: Secondary | ICD-10-CM | POA: Diagnosis not present

## 2024-03-11 DIAGNOSIS — N281 Cyst of kidney, acquired: Secondary | ICD-10-CM | POA: Diagnosis not present

## 2024-03-11 LAB — BASIC METABOLIC PANEL WITH GFR
Anion gap: 9 (ref 5–15)
BUN: 49 mg/dL — ABNORMAL HIGH (ref 8–23)
CO2: 23 mmol/L (ref 22–32)
Calcium: 8.1 mg/dL — ABNORMAL LOW (ref 8.9–10.3)
Chloride: 106 mmol/L (ref 98–111)
Creatinine, Ser: 1.97 mg/dL — ABNORMAL HIGH (ref 0.44–1.00)
GFR, Estimated: 24 mL/min — ABNORMAL LOW (ref >60)
Glucose, Bld: 168 mg/dL — ABNORMAL HIGH (ref 70–99)
Potassium: 3.9 mmol/L (ref 3.5–5.1)
Sodium: 138 mmol/L (ref 135–145)

## 2024-03-11 LAB — GLUCOSE, CAPILLARY
Glucose-Capillary: 152 mg/dL — ABNORMAL HIGH (ref 70–99)
Glucose-Capillary: 184 mg/dL — ABNORMAL HIGH (ref 70–99)
Glucose-Capillary: 180 mg/dL — ABNORMAL HIGH (ref 70–99)
Glucose-Capillary: 180 mg/dL — ABNORMAL HIGH (ref 70–99)
Glucose-Capillary: 271 mg/dL — ABNORMAL HIGH (ref 70–99)

## 2024-03-11 LAB — URINALYSIS, ROUTINE W REFLEX MICROSCOPIC
Bilirubin Urine: NEGATIVE
Glucose, UA: 50 mg/dL — AB
Hgb urine dipstick: NEGATIVE
Ketones, ur: NEGATIVE mg/dL
Nitrite: NEGATIVE
Protein, ur: 100 mg/dL — AB
Specific Gravity, Urine: 1.024 (ref 1.005–1.030)
WBC, UA: >50 WBC/hpf (ref 0–5)
pH: 5 (ref 5.0–8.0)

## 2024-03-11 LAB — PHOSPHORUS: Phosphorus: 2.8 mg/dL (ref 2.5–4.6)

## 2024-03-11 LAB — PROTIME-INR
INR: 2.6 — ABNORMAL HIGH (ref 0.8–1.2)
Prothrombin Time: 28.7 s — ABNORMAL HIGH (ref 11.4–15.2)

## 2024-03-11 LAB — MAGNESIUM: Magnesium: 2.2 mg/dL (ref 1.7–2.4)

## 2024-03-11 MED ORDER — WARFARIN SODIUM 2.5 MG PO TABS
2.5000 mg | ORAL_TABLET | Freq: Once | ORAL | Status: AC
  Administered 2024-03-11: 2.5 mg via ORAL
  Filled 2024-03-11: qty 1

## 2024-03-11 MED ORDER — LOPERAMIDE HCL 2 MG PO CAPS
2.0000 mg | ORAL_CAPSULE | ORAL | Status: DC | PRN
  Administered 2024-03-11 – 2024-03-12 (×2): 2 mg via ORAL
  Filled 2024-03-11 (×2): qty 1

## 2024-03-11 MED ORDER — ENSURE PLUS HIGH PROTEIN PO LIQD
237.0000 mL | Freq: Two times a day (BID) | ORAL | Status: DC
  Administered 2024-03-12: 09:00:00 237 mL via ORAL

## 2024-03-11 MED ORDER — LACTATED RINGERS IV SOLN: INTRAVENOUS | Status: AC

## 2024-03-11 NOTE — Progress Notes (Signed)
 PROGRESS NOTE    [name]  FMW:[id number] DOB: 09/17/1933 DOA: 03/08/2024 PCP: [name]  No chief complaint on file.   Brief Narrative:   89 year old woman who presented to [facility] ED 12/11 from [facility] for difficulty managing secretions. PMHx significant for  Patient presented to [facility] 12/11 for flu-like symptoms x 4 days. Endorses subjective fevers/chills, SOB, congestion, sore throat, dysphagia, cough productive of mucous. Also endorses changes to voice, difficulty speaking and difficulty managing secretions/drooling. Initial UC workup included COVID/Flu/strep testing (negative). Recommended presentation to ER for further care.   On ED arrival, patient was afebrile with HR 63, BP 120/84, RR 20, SpO2 95%. Labs were notable for WBC 9.3, Hgb 10.1, Plt 248. Na 136, K 3.9, CO2 22, Cr 1.51 (baseline 1.1-1.2). CT Head NAICA, CTA Head no LVO, CT Chest unremarkable. CT Soft Tissue Neck demonstrated supraglottic laryngeal and hypopharyngeal edema with possible 1.9 cm hemorrhage versus mass. ENT was consulted with recommendation for scope to evaluate/localize laryngeal swelling/?mass. Scope demonstrated supraglottic edema for which antibiotics and steroids were recommended with close monitoring in the ICU.   PCCM consulted for ICU admission.  Significant Events 12/11 - Presented to UC for flu-like symptoms x 4 days. Difficult controlling secretions, voice changes. CT Head NAICA, CT Soft Tissue Neck supraglottic laryngeal and hypopharyngeal edema with possible 1.9 cm hemorrhage versus mass. 12/12 - ENT consulted, request ICU admission for close monitoring. 12/14 TRH assumed care  Assessment & Plan:   Principal Problem:   Supraglottic mass  Acute onset of supraglottic edema/mass - s/p ceftriaxone  and Decadron  - CT with supraglottic laryngeal and hypopharyngeal edema with possible 1.9 cm hemorrhage vs mass  - ENT planning to follow larynx outpatient to see if any future intervention needed  for any potential mass -SLP recommending HH SLP, pureed diet   HTN Afib (on Coumadin ) CVA - Cardiac monitoring - Optimize electrolytes for K > 4, Mg > 2 - warfarin per pharmacy   OSA - Supplemental O2 support, wean FiO2 for O2 sat > 90% - Does not wear mask at night   Recurrent UTI UA nitrite positive, +leuks. Urine Cx +E.coli, proteus from care everywhere 12/8 labs. 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  -  speak slowly and clearly    DVT prophylaxis: SCD, warfarin Code Status: full Family Communication: husband at bedside Disposition:   Status is: Inpatient Remains inpatient appropriate because: need for continued inpatient care   Consultants:  ENT PCCM  Procedures:  12/12 Flexible fibroptic laryngoscopy   Antimicrobials:  Anti-infectives (From admission, onward)    Start     Dose/Rate Route Frequency Ordered Stop   03/09/24 1400  Ampicillin -Sulbactam (UNASYN ) 3 g in sodium chloride  0.9 % 100 mL IVPB        3 g 200 mL/hr over 30 Minutes Intravenous Every 12 hours 03/09/24 0931     03/09/24 0015  Ampicillin -Sulbactam (UNASYN ) 3 g in sodium chloride  0.9 % 100 mL IVPB        3 g 200 mL/hr over 30 Minutes Intravenous  Once 03/09/24 0003 03/09/24 0213   03/08/24 2200  cefTRIAXone  (ROCEPHIN ) 1 g in sodium chloride  0.9 % 100 mL IVPB  1 g 200 mL/hr over 30 Minutes Intravenous  Once 03/08/24 2154 03/08/24 2303       Subjective: No new complaints Doing well with puree Had [name] loose stool this AM  Objective: Vitals:   03/10/24 2351 03/11/24 0344 03/11/24 0905 03/11/24 1224  BP: 130/63 (!) 130/56 (!) 123/54 (!) 161/70  Pulse: 78 70 87   Resp: 19 17 18 18   Temp: 99.3 F (37.4 C) 98.9 F  (37.2 C) 98.7 F (37.1 C) (!) 97.5 F (36.4 C)  TempSrc: Oral Oral Oral Oral  SpO2: 92% 95% 94% 96%  Weight:      Height:        Intake/Output Summary (Last 24 hours) at 03/11/2024 1642 Last data filed at 03/11/2024 1325 Gross per 24 hour  Intake 340 ml  Output --  Net 340 ml   Filed Weights   03/08/24 2019 03/09/24 0516 03/10/24 0352  Weight: 72 kg 77.6 kg 78.7 kg    Examination:  General exam: Appears calm and comfortable  Respiratory system: unlabored Cardiovascular system: RRR Gastrointestinal system: Abdomen is nondistended, soft and nontender. Central nervous system: Alert and oriented. No focal neurological deficits. Extremities: no LEE    Data Reviewed: I have personally reviewed following labs and imaging studies  CBC: Recent Labs  Lab 03/08/24 2037 03/09/24 0207 03/10/24 0150  WBC 9.3 11.0* 12.1*  NEUTROABS 7.1  --  10.7*  HGB 10.1* 10.6* 10.0*  HCT 31.9* 32.2* 30.2*  MCV 89.6 89.2 86.8  PLT 248 242 253    Basic Metabolic Panel: Recent Labs  Lab 03/08/24 2037 03/09/24 0207 03/10/24 0150 03/11/24 0629  NA 136 136 137 138  K 3.9 4.3 4.0 3.9  CL 103 104 106 106  CO2 22 22 22 23   GLUCOSE 117* 133* 127* 168*  BUN 28* 27* 31* 49*  CREATININE 1.51* 1.46* 1.55* 1.97*  CALCIUM  8.5* 8.4* 8.5* 8.1*  MG  --  1.6*  --  2.2  PHOS  --  3.3  --  2.8    GFR: Estimated Creatinine Clearance: 20 mL/min ([name]) (by C-G formula based on SCr of 1.97 mg/dL (H)).  Liver Function Tests: Recent Labs  Lab 03/08/24 2037  AST 18  ALT 15  ALKPHOS 138*  BILITOT 0.9  PROT 7.1  ALBUMIN  3.6    CBG: Recent Labs  Lab 03/10/24 2037 03/10/24 2336 03/11/24 0319 03/11/24 0815 03/11/24 1225  GLUCAP 239* 210* 152* 184* 180*     Recent Results (from the past 240 hours)  Urine Culture     Status: Abnormal   Collection Time: 03/08/24  8:11 PM   Specimen: Urine, Random  Result Value Ref Range Status   Specimen Description URINE, RANDOM  Final   Special  Requests   Final    NONE Reflexed from [id number] Performed at [name], [address] [place], [place] [address]    Culture MULTIPLE SPECIES PRESENT, SUGGEST RECOLLECTION (Lawren Sexson)  Final   Report Status 03/09/2024 FINAL  Final  Resp panel by RT-PCR (RSV, Flu Rumaisa Schnetzer&B, Covid) Anterior Nasal Swab     Status: None   Collection Time: 03/08/24  8:31 PM   Specimen: Anterior Nasal Swab  Result Value Ref Range Status   SARS Coronavirus 2 by RT PCR NEGATIVE NEGATIVE Final   Influenza Severina Sykora by PCR NEGATIVE NEGATIVE Final   Influenza B by PCR NEGATIVE NEGATIVE Final    Comment: (NOTE) The Xpert Xpress SARS-CoV-2/FLU/RSV plus assay is intended as an aid in the  diagnosis of influenza from Nasopharyngeal swab specimens and should not be used as Samhita Kretsch sole basis for treatment. Nasal washings and aspirates are unacceptable for Xpert Xpress SARS-CoV-2/FLU/RSV testing.  Fact Sheet for Patients: bloggercourse.com  Fact Sheet for Healthcare Providers: seriousbroker.it  This test is not yet approved or cleared by the [place]  FDA and has been authorized for detection and/or diagnosis of SARS-CoV-2 by FDA under an Emergency Use Authorization (EUA). This EUA will remain in effect (meaning this test can be used) for the duration of the COVID-19 declaration under Section 564(b)(1) of the Act, 21 U.S.C. section 360bbb-3(b)(1), unless the authorization is terminated or revoked.     Resp Syncytial Virus by PCR NEGATIVE NEGATIVE Final    Comment: (NOTE) Fact Sheet for Patients: bloggercourse.com  Fact Sheet for Healthcare Providers: seriousbroker.it  This test is not yet approved or cleared by the [place]  FDA and has been authorized for detection and/or diagnosis of SARS-CoV-2 by FDA under an Emergency Use Authorization (EUA). This EUA will remain in effect (meaning this test can be used) for the  duration of the COVID-19 declaration under Section 564(b)(1) of the Act, 21 U.S.C. section 360bbb-3(b)(1), unless the authorization is terminated or revoked.  Performed at [facility], [address], [place], [place] [address]   MRSA Next Gen by PCR, Nasal     Status: None   Collection Time: 03/09/24  5:10 AM   Specimen: Nasal Mucosa; Nasal Swab  Result Value Ref Range Status   MRSA by PCR Next Gen NOT DETECTED NOT DETECTED Final    Comment: (NOTE) The GeneXpert MRSA Assay (FDA approved for NASAL specimens only), is one component of Keiona Jenison comprehensive MRSA colonization surveillance program. It is not intended to diagnose MRSA infection nor to guide or monitor treatment for MRSA infections. Test performance is not FDA approved in patients less than 33 years old. Performed at [facility], [address] [place], [place] [address]          Radiology Studies: DG Swallowing Func-Speech Pathology Result Date: 03/10/2024 Table formatting from the original result was not included. Images from the original result were not included. Modified Barium Swallow Study Patient Details Name: [name] MRN: [id number] Date of Birth: 1933-08-13 Today's Date: 03/10/2024 HPI/PMH: HPI: Ms. Goatley presented to [facility] on 03/09/24 from urgent care with fevers/chills, SOB, congestion, sore throat, dysphagia, cough productive of mucous, changes to voice, difficulty speaking and difficulty managing secretions/drooling. CT Head and CT Chest negative for acute processes. CT Neck revealed supraglottic laryngeal and hypopharyngeal edema with possible 1.9 cm  hemorrhage versus mass in this region. Pt scoped by ENT who's assessment is consistent with supraglottic edema with unclear etiology. Pt started on antibiotics and steriods. SLP consulted for clinical swallow evaluation. Clinical Impression Patient presents with Gawain Crombie pharyngoesophageal dysphagia with esophageal component likely chronic as per this MBS.  Oral phase appears WFL aside from patient's inability to masticate graham cracker. Appearance of prominent cricopharyngeal bar noted which resulted in incomplete transit of bolus past bar and retrograde movement of barium that remained above the cricopharyngeal bar back up through the PES. This occured with all bolus consistencies and resulted in barium transiting back into pyriform sinus as well as instances of penetration above the vocal cords with thin, nectar thick, honey thick and puree that did not clear (PAS 3) and penetration to the vocal cords (PAS 5) with thin and nectar thick liquids. No instances of aspiration observed during this study. Patient indicated  swallowing felt easier with head turn to right. Retrograde movement of barium occured regardless of any swallow strategy. Most effective compensation was Malarie Tappen throat clear after initial swallow, followed by Mykaela Arena second swallow. Patient and spouse educated on these recommendations and both verbalized understanding and agreement. Factors that may increase risk of adverse event in presence of aspiration Noe & Lianne 2021): Frail or deconditioned;Limited mobility;Poor general health and/or compromised immunity DIGEST Swallow Severity Rating*  Safety: 2  Efficiency: 1  Overall Pharyngeal Swallow Severity: 2 1: mild; 2: moderate; 3: severe; 4: profound *The Dynamic Imaging Grade of Swallowing Toxicity is standardized for the head and neck cancer population, however, demonstrates promising clinical applications across populations to standardize the clinical rating of pharyngeal swallow safety and severity. Recommendations/Plan: Swallowing Evaluation Recommendations Swallowing Evaluation Recommendations Recommendations: PO diet PO Diet Recommendation: Dysphagia 1 (Pureed); Thin liquids (Level 0) Liquid Administration via: Cup; Straw Medication Administration: Crushed with puree Supervision: Patient able to self-feed Swallowing strategies  : Slow rate; Small  bites/sips; Multiple dry swallows after each bite/sip; Clear throat intermittently; Hard cough after swallowing Postural changes: Position pt fully upright for meals; Stay upright 30-60 min after meals Oral care recommendations: Oral care BID (2x/day) Treatment Plan Treatment Plan Treatment recommendations: Therapy as outlined in treatment plan below Follow-up recommendations: Outpatient SLP Functional status assessment: Patient has had Blanca Thornton recent decline in their functional status and demonstrates the ability to make significant improvements in function in Pansie Guggisberg reasonable and predictable amount of time. Treatment frequency: Min 2x/week Treatment duration: 1 week Interventions: Aspiration precaution training; Diet toleration management by SLP; Compensatory techniques; Patient/family education; Trials of upgraded texture/liquids Recommendations Recommendations for follow up therapy are one component of Abir Eroh multi-disciplinary discharge planning process, led by the attending physician.  Recommendations may be updated based on patient status, additional functional criteria and insurance authorization. Assessment: Orofacial Exam: Orofacial Exam Oral Cavity - Dentition: Adequate natural dentition Oral Motor/Sensory Function: Suspected cranial nerve impairment CN V - Trigeminal: Right motor impairment CN VII - Facial: Right motor impairment Anatomy: Anatomy: Prominent cricopharyngeus Boluses Administered: Boluses Administered Boluses Administered: Thin liquids (Level 0); Mildly thick liquids (Level 2, nectar thick); Moderately thick liquids (Level 3, honey thick); Puree  Oral Impairment Domain: Oral Impairment Domain Lip Closure: No labial escape Tongue control during bolus hold: Cohesive bolus between tongue to palatal seal Bolus preparation/mastication: Timely and efficient chewing and mashing Bolus transport/lingual motion: Brisk tongue motion Oral residue: Complete oral clearance Location of oral residue : N/Indie Nickerson Initiation of  pharyngeal swallow : Valleculae  Pharyngeal Impairment Domain: Pharyngeal Impairment Domain Soft palate elevation: No bolus between soft palate (SP)/pharyngeal wall (PW) Laryngeal elevation: Complete superior movement of thyroid  cartilage with complete approximation of arytenoids to epiglottic petiole Anterior hyoid excursion: Complete anterior movement Epiglottic movement: Partial inversion Laryngeal vestibule closure: Complete, no air/contrast in laryngeal vestibule Pharyngeal stripping wave : Present - complete Pharyngeal contraction (Cache Bills/P view only): N/Abbiegail Landgren Pharyngoesophageal segment opening: Complete distension and complete duration, no obstruction of flow Tongue base retraction: No contrast between tongue base and posterior pharyngeal wall (PPW) Pharyngeal residue: Collection of residue within or on pharyngeal structures Location of pharyngeal residue: Pyriform sinuses  Esophageal Impairment Domain: Esophageal Impairment Domain Esophageal clearance upright position: Esophageal retention with retrograde flow through the PES Pill: No data recorded Penetration/Aspiration Scale Score: Penetration/Aspiration Scale Score 3.  Material enters airway, remains ABOVE vocal cords and not ejected out: Thin liquids (Level 0); Mildly thick liquids (Level 2, nectar thick); Moderately thick liquids (Level 3, honey thick);  Puree 5.  Material enters airway, CONTACTS cords and not ejected out: Thin liquids (Level 0); Mildly thick liquids (Level 2, nectar thick) Compensatory Strategies: Compensatory Strategies Compensatory strategies: Yes Multiple swallows: Effective Effective Multiple Swallows: Thin liquid (Level 0); Mildly thick liquid (Level 2, nectar thick); Puree Chin tuck: Ineffective Ineffective Chin Tuck: Mildly thick liquid (Level 2, nectar thick) Left head turn: Ineffective Ineffective Left Head Turn: Mildly thick liquid (Level 2, nectar thick) Right head turn: Effective Effective Right Head Turn: Thin liquid (Level 0);  Mildly thick liquid (Level 2, nectar thick)   General Information: Caregiver present: Yes  Diet Prior to this Study: Thin liquids (Level 0); Clear liquid diet   Temperature : Normal   Respiratory Status: WFL   Supplemental O2: None (Room air)   History of Recent Intubation: No  Behavior/Cognition: Alert; Cooperative Self-Feeding Abilities: Able to self-feed Baseline vocal quality/speech: Normal Volitional Cough: Able to elicit Volitional Swallow: Able to elicit Exam Limitations: No limitations Goal Planning: Prognosis for improved oropharyngeal function: Fair Barriers to Reach Goals: Time post onset; Severity of deficits No data recorded Patient/Family Stated Goal: patient wants to discharge ASAP Consulted and agree with results and recommendations: Patient; Family member/caregiver Pain: Pain Assessment Pain Assessment: No/denies pain End of Session: Start Time:SLP Start Time (ACUTE ONLY): 1316 Stop Time: SLP Stop Time (ACUTE ONLY): 1338 Time Calculation:SLP Time Calculation (min) (ACUTE ONLY): 22 min Charges: SLP Evaluations $ SLP Speech Visit: 1 Visit SLP Evaluations $BSS Swallow: 1 Procedure $MBS Swallow: 1 Procedure SLP visit diagnosis: SLP Visit Diagnosis: Dysphagia, pharyngoesophageal phase (R13.14) Past Medical History: Past Medical History: Diagnosis Date  Arthritis   fingers, hips, feet, ankles (11/10/2012)  Asthma   Atrial fibrillation (HCC)   CHRONIC COUMADIN   Breast cancer (HCC) 1990's  cancer on one side; precancerous tissue on the other (11/10/2012)  Chronic bronchitis (HCC)   multiple times; not in Daymien Goth long time (11/10/2012  Depression   GERD (gastroesophageal reflux disease)   Hearing impaired   Hypertension   Irritable bowel   Lymphedema   HX OF - IN LEFT ARM--NO NEEDLES OR B/P'S LEFT ARM  Macular degeneration   BEGINNINGS OF MACULAR DEGENERATION  Osteoporosis   Pneumonia   multiple times; not in Belen Pesch long time (11/10/2012)  Recurrent UTI   Sleep apnea   dx'd w/it; don't wear mask or anything  (11/10/2012)  Stroke (HCC) ~ 2005  HX OF TIA-NO RESIDUAL PROBLEM--PARALYSIS RT SIDE FACE /PT'S MOUTH DROOPS-AND LOSS OF HEARING RT EAR --SINCE EAR SURGERY AS Mikhala Kenan CHILD   UTI (lower urinary tract infection)   FREQUENT Past Surgical History: Past Surgical History: Procedure Laterality Date  BREAST BIOPSY Bilateral 1990's  Dacryocystorhinostomy  02/18/2016  INNER EAR SURGERY Right   MULTIPLE EAR SURGERIES,  INTRAMEDULLARY (IM) NAIL INTERTROCHANTERIC Left 04/23/2020  Procedure: INTRAMEDULLARY (IM) NAIL INTERTROCHANTRIC;  Surgeon: Kendal Franky SQUIBB, [name];  Location: [facility] OR;  Service: Orthopedics;  Laterality: Left;  JOINT REPLACEMENT    KNEE ARTHROSCOPY  04/07/2012  Procedure: ARTHROSCOPY KNEE;  Surgeon: Dempsey LULLA Moan, [name];  Location: [facility];  Service: Orthopedics;  Laterality: Left;  WITH SYNOVECTOMY  MASTECTOMY Bilateral 1990's  MASTOIDECTOMY Right 1942  ORIF FEMUR FRACTURE Left 08/08/2017  Procedure: OPEN REDUCTION INTERNAL FIXATION (ORIF) DISTAL FEMUR FRACTURE;  Surgeon: Kendal Franky SQUIBB, [name];  Location: [facility] OR;  Service: Orthopedics;  Laterality: Left;  TONSILLECTOMY    TOTAL KNEE ARTHROPLASTY  09/20/2011  LEFT TOTAL KNEE ARTHROPLASTY;  Surgeon: Dempsey LULLA Moan, [name];  Location: [facility];  Service: Orthopedics;  Laterality: Left;  TOTAL KNEE ARTHROPLASTY  2006 Norleen IVAR Blase, MA, CCC-SLP Speech Therapy 03/10/2024, 3:16 PM       Scheduled Meds:  Chlorhexidine  Gluconate Cloth  6 each Topical Daily   dexamethasone  (DECADRON ) injection  4 mg Intravenous Q8H   feeding supplement  237 mL Oral BID BM   insulin  aspart  0-9 Units Subcutaneous Q4H   metoprolol  succinate  12.5 mg Oral Daily   warfarin  2.5 mg Oral ONCE-1600   Warfarin - Pharmacist Dosing Inpatient   Does not apply q1600   Continuous Infusions:  ampicillin -sulbactam (UNASYN ) IV 3 g (03/11/24 1325)   lactated ringers  75 mL/hr at 03/11/24 1323     LOS: 2 days    Time spent: over 30 min     Meliton Monte, [name] [facility]   To contact the attending provider between 7A-7P or the covering provider during after hours 7P-7A, please log into the web site [email or web address] and access using universal [place] password for that web site. If you do not have the password, please call the hospital operator.  03/11/2024, 4:42 PM

## 2024-03-11 NOTE — Plan of Care (Signed)
  Problem: Clinical Measurements: Goal: Ability to maintain clinical measurements within normal limits will improve Outcome: Progressing Goal: Will remain free from infection Outcome: Progressing Goal: Diagnostic test results will improve Outcome: Progressing Goal: Respiratory complications will improve Outcome: Progressing Goal: Cardiovascular complication will be avoided Outcome: Progressing   Problem: Activity: Goal: Risk for activity intolerance will decrease Outcome: Progressing   Problem: Elimination: Goal: Will not experience complications related to bowel motility Outcome: Progressing Goal: Will not experience complications related to urinary retention Outcome: Progressing   Problem: Pain Managment: Goal: General experience of comfort will improve and/or be controlled Outcome: Progressing   Problem: Safety: Goal: Ability to remain free from injury will improve Outcome: Progressing   Problem: Skin Integrity: Goal: Risk for impaired skin integrity will decrease Outcome: Progressing

## 2024-03-11 NOTE — Progress Notes (Signed)
 Offered to assist patient into chair, patient declined. Spouse at bedside.

## 2024-03-11 NOTE — Plan of Care (Signed)

## 2024-03-11 NOTE — Progress Notes (Signed)
 Patient w/ urine sample order and is incontinence of bladder. Pericare provided, new purwick, tubings,and canister replaced. Urine collected and sent to lab.

## 2024-03-11 NOTE — Progress Notes (Signed)
 PHARMACY - ANTICOAGULATION CONSULT NOTE  Pharmacy Consult:  Coumadin  Indication: atrial fibrillation  Allergies[1]  Patient Measurements: Height: 5' 4 (162.6 cm) Weight: 78.7 kg (173 lb 8 oz) IBW/kg (Calculated) : 54.7 HEPARIN DW (KG): 69.5  Vital Signs: Temp: 98.9 F (37.2 C) (12/14 0344) Temp Source: Oral (12/14 0344) BP: 130/56 (12/14 0344) Pulse Rate: 70 (12/14 0344)  Labs: Recent Labs    03/08/24 2037 03/08/24 2323 03/09/24 0207 03/10/24 0150 03/11/24 0629  HGB 10.1*  --  10.6* 10.0*  --   HCT 31.9*  --  32.2* 30.2*  --   PLT 248  --  242 253  --   LABPROT  --   --  25.0*  --  28.7*  INR  --   --  2.1*  --  2.6*  CREATININE 1.51*  --  1.46* 1.55*  --   TROPONINIHS 14 15  --   --   --     Estimated Creatinine Clearance: 25.5 mL/min (A) (by C-G formula based on SCr of 1.55 mg/dL (H)).   Medical History: Past Medical History:  Diagnosis Date   Arthritis    fingers, hips, feet, ankles (11/10/2012)   Asthma    Atrial fibrillation (HCC)    CHRONIC COUMADIN    Breast cancer (HCC) 1990's   cancer on one side; precancerous tissue on the other (11/10/2012)   Chronic bronchitis (HCC)    multiple times; not in a long time (11/10/2012   Depression    GERD (gastroesophageal reflux disease)    Hearing impaired    Hypertension    Irritable bowel    Lymphedema    HX OF - IN LEFT ARM--NO NEEDLES OR B/P'S LEFT ARM   Macular degeneration    BEGINNINGS OF MACULAR DEGENERATION   Osteoporosis    Pneumonia    multiple times; not in a long time (11/10/2012)   Recurrent UTI    Sleep apnea    dx'd w/it; don't wear mask or anything (11/10/2012)   Stroke (HCC) ~ 2005   HX OF TIA-NO RESIDUAL PROBLEM--PARALYSIS RT SIDE FACE /PT'S MOUTH DROOPS-AND LOSS OF HEARING RT EAR --SINCE EAR SURGERY AS A CHILD    UTI (lower urinary tract infection)    FREQUENT      Assessment: 9 YOF presented with supraglottic laryngeal and hypopharyngeal edema.  Patient has a history  of Afib on Coumadin  PTA.  She does not require surgery, so Pharmacy has been consulted to resume Coumadin .   Home regimen:  Coumadin  2.5mg  PO daily  12/14: INR is therapeutic today at 2.6. No signs of bleeding reported per RN.   Goal of Therapy:  INR 2-3 Monitor platelets by anticoagulation protocol: Yes   Plan:  Warfarin 2.5mg  PO today Daily PT / INR  B. Amon Rocher, PharmD PGY-1 Pharmacy Resident [facility] 03/11/2024 8:36 AM        [1]  Allergies Allergen Reactions   Pradaxa [Dabigatran Etexilate Mesylate] Other (See Comments)    INTERNAL BLEEDING   Clindamycin/Lincomycin Other (See Comments)    PT STATES HER DOCTOR TOLD HER NOT TO TAKE CLINDAMYCIN BECAUSE SHE GOT C-DIFF AFTER TAKING AMPICILLIN - tolerated in 2022 (per spouse)   Latex Other (See Comments)    Blisters    Ampicillin  Other (See Comments)    C. Diff after taking ampicillin  - tolerated in 2022 (per husband) Pt has received cephalosporins in 2013, 2014, 2017, 2019, 2022    Sulfamethoxazole Diarrhea

## 2024-03-12 ENCOUNTER — Inpatient Hospital Stay (HOSPITAL_COMMUNITY)

## 2024-03-12 ENCOUNTER — Other Ambulatory Visit (HOSPITAL_COMMUNITY): Payer: Self-pay

## 2024-03-12 LAB — COMPREHENSIVE METABOLIC PANEL WITH GFR
ALT: 14 U/L (ref 0–44)
AST: 15 U/L (ref 15–41)
Albumin: 2.8 g/dL — ABNORMAL LOW (ref 3.5–5.0)
Alkaline Phosphatase: 94 U/L (ref 38–126)
Anion gap: 8 (ref 5–15)
BUN: 52 mg/dL — ABNORMAL HIGH (ref 8–23)
CO2: 26 mmol/L (ref 22–32)
Calcium: 8 mg/dL — ABNORMAL LOW (ref 8.9–10.3)
Chloride: 107 mmol/L (ref 98–111)
Creatinine, Ser: 1.94 mg/dL — ABNORMAL HIGH (ref 0.44–1.00)
GFR, Estimated: 24 mL/min — ABNORMAL LOW (ref >60)
Glucose, Bld: 117 mg/dL — ABNORMAL HIGH (ref 70–99)
Potassium: 3.9 mmol/L (ref 3.5–5.1)
Sodium: 141 mmol/L (ref 135–145)
Total Bilirubin: 0.6 mg/dL (ref 0.0–1.2)
Total Protein: 5.9 g/dL — ABNORMAL LOW (ref 6.5–8.1)

## 2024-03-12 LAB — HEMOGLOBIN A1C
Hgb A1c MFr Bld: 5.6 % (ref 4.8–5.6)
Mean Plasma Glucose: 114.02 mg/dL

## 2024-03-12 LAB — CBC WITH DIFFERENTIAL/PLATELET
Abs Immature Granulocytes: 0.12 K/uL — ABNORMAL HIGH (ref 0.00–0.07)
Basophils Absolute: 0 K/uL (ref 0.0–0.1)
Basophils Relative: 0 %
Eosinophils Absolute: 0 K/uL (ref 0.0–0.5)
Eosinophils Relative: 0 %
HCT: 29.4 % — ABNORMAL LOW (ref 36.0–46.0)
Hemoglobin: 9.6 g/dL — ABNORMAL LOW (ref 12.0–15.0)
Immature Granulocytes: 1 %
Lymphocytes Relative: 8 %
Lymphs Abs: 1 K/uL (ref 0.7–4.0)
MCH: 28.4 pg (ref 26.0–34.0)
MCHC: 32.7 g/dL (ref 30.0–36.0)
MCV: 87 fL (ref 80.0–100.0)
Monocytes Absolute: 0.7 K/uL (ref 0.1–1.0)
Monocytes Relative: 5 %
Neutro Abs: 10.9 K/uL — ABNORMAL HIGH (ref 1.7–7.7)
Neutrophils Relative %: 86 %
Platelets: 258 K/uL (ref 150–400)
RBC: 3.38 MIL/uL — ABNORMAL LOW (ref 3.87–5.11)
RDW: 13.5 % (ref 11.5–15.5)
WBC: 12.7 K/uL — ABNORMAL HIGH (ref 4.0–10.5)
nRBC: 0.3 % — ABNORMAL HIGH (ref 0.0–0.2)

## 2024-03-12 LAB — PROTIME-INR
INR: 2.2 — ABNORMAL HIGH (ref 0.8–1.2)
Prothrombin Time: 25.8 s — ABNORMAL HIGH (ref 11.4–15.2)

## 2024-03-12 LAB — MAGNESIUM: Magnesium: 2.1 mg/dL (ref 1.7–2.4)

## 2024-03-12 LAB — GLUCOSE, CAPILLARY
Glucose-Capillary: 152 mg/dL — ABNORMAL HIGH (ref 70–99)
Glucose-Capillary: 121 mg/dL — ABNORMAL HIGH (ref 70–99)
Glucose-Capillary: 148 mg/dL — ABNORMAL HIGH (ref 70–99)
Glucose-Capillary: 181 mg/dL — ABNORMAL HIGH (ref 70–99)
Glucose-Capillary: 212 mg/dL — ABNORMAL HIGH (ref 70–99)

## 2024-03-12 LAB — PHOSPHORUS: Phosphorus: 2.9 mg/dL (ref 2.5–4.6)

## 2024-03-12 MED ORDER — INSULIN ASPART 100 UNIT/ML IJ SOLN
0.0000 [IU] | Freq: Three times a day (TID) | INTRAMUSCULAR | Status: DC
  Administered 2024-03-12: 13:00:00 2 [IU] via SUBCUTANEOUS
  Administered 2024-03-12: 09:00:00 1 [IU] via SUBCUTANEOUS
  Administered 2024-03-12: 16:00:00 3 [IU] via SUBCUTANEOUS
  Filled 2024-03-12: qty 3
  Filled 2024-03-12: qty 2
  Filled 2024-03-12: qty 1

## 2024-03-12 MED ORDER — AMOXICILLIN-POT CLAVULANATE 500-125 MG PO TABS
1.0000 | ORAL_TABLET | Freq: Two times a day (BID) | ORAL | 0 refills | Status: AC
  Filled 2024-03-12: qty 4, 2d supply, fill #0

## 2024-03-12 MED ORDER — INSULIN ASPART 100 UNIT/ML IJ SOLN: 0.0000 [IU] | Freq: Every day | INTRAMUSCULAR | Status: DC

## 2024-03-12 MED ORDER — WARFARIN SODIUM 2.5 MG PO TABS
2.5000 mg | ORAL_TABLET | Freq: Every day | ORAL | Status: DC
  Filled 2024-03-12: qty 1

## 2024-03-12 MED ORDER — METHYLPREDNISOLONE 4 MG PO TBPK
ORAL_TABLET | ORAL | 0 refills | Status: AC
  Filled 2024-03-12: qty 21, 6d supply, fill #0

## 2024-03-12 NOTE — Progress Notes (Signed)
 PHARMACY - ANTICOAGULATION CONSULT NOTE  Pharmacy Consult:  Coumadin  Indication: atrial fibrillation  Allergies[1]  Patient Measurements: Height: 5' 4 (162.6 cm) Weight: 80.7 kg (177 lb 14.6 oz) IBW/kg (Calculated) : 54.7 HEPARIN DW (KG): 69.5  Vital Signs: Temp: 97.7 F (36.5 C) (12/15 0757) Temp Source: Oral (12/15 0757) BP: 156/66 (12/15 0757) Pulse Rate: 64 (12/15 0757)  Labs: Recent Labs    03/10/24 0150 03/11/24 0629 03/12/24 0427  HGB 10.0*  --  9.6*  HCT 30.2*  --  29.4*  PLT 253  --  258  LABPROT  --  28.7* 25.8*  INR  --  2.6* 2.2*  CREATININE 1.55* 1.97* 1.94*    Estimated Creatinine Clearance: 20.6 mL/min (A) (by C-G formula based on SCr of 1.94 mg/dL (H)).   Medical History: Past Medical History:  Diagnosis Date   Arthritis    fingers, hips, feet, ankles (11/10/2012)   Asthma    Atrial fibrillation (HCC)    CHRONIC COUMADIN    Breast cancer (HCC) 1990's   cancer on one side; precancerous tissue on the other (11/10/2012)   Chronic bronchitis (HCC)    multiple times; not in a long time (11/10/2012   Depression    GERD (gastroesophageal reflux disease)    Hearing impaired    Hypertension    Irritable bowel    Lymphedema    HX OF - IN LEFT ARM--NO NEEDLES OR B/P'S LEFT ARM   Macular degeneration    BEGINNINGS OF MACULAR DEGENERATION   Osteoporosis    Pneumonia    multiple times; not in a long time (11/10/2012)   Recurrent UTI    Sleep apnea    dx'd w/it; don't wear mask or anything (11/10/2012)   Stroke (HCC) ~ 2005   HX OF TIA-NO RESIDUAL PROBLEM--PARALYSIS RT SIDE FACE /PT'S MOUTH DROOPS-AND LOSS OF HEARING RT EAR --SINCE EAR SURGERY AS A CHILD    UTI (lower urinary tract infection)    FREQUENT      Assessment: 40 YOF presented with supraglottic laryngeal and hypopharyngeal edema.  Patient has a history of Afib on Coumadin  PTA.  She does not require surgery, so Pharmacy has been consulted to resume Coumadin .   Home regimen:   Coumadin  2.5mg  PO daily  INR therapeutic today. We will resume the home regimen.   Goal of Therapy:  INR 2-3 Monitor platelets by anticoagulation protocol: Yes   Plan:  Warfarin 2.5mg  PO qday INR MWF  Sergio Batch, PharmD, BCIDP, AAHIVP, CPP Infectious Disease Pharmacist 03/12/2024 11:42 AM         [1]  Allergies Allergen Reactions   Pradaxa [Dabigatran Etexilate Mesylate] Other (See Comments)    INTERNAL BLEEDING   Clindamycin/Lincomycin Other (See Comments)    PT STATES HER DOCTOR TOLD HER NOT TO TAKE CLINDAMYCIN BECAUSE SHE GOT C-DIFF AFTER TAKING AMPICILLIN - tolerated in 2022 (per spouse)   Latex Other (See Comments)    Blisters    Ampicillin  Other (See Comments)    C. Diff after taking ampicillin  - tolerated in 2022 (per husband) Pt has received cephalosporins in 2013, 2014, 2017, 2019, 2022    Sulfamethoxazole Diarrhea

## 2024-03-12 NOTE — Progress Notes (Signed)
 Physical Therapy Treatment Patient Details Name: [name] MRN: [id number] DOB: 11-Dec-1933 Today's Date: 03/12/2024   History of Present Illness Pt is an 89 y/o female presenting to the ED12/11 from UC with difficulty managing secretions and flu-like symptoms x4 days  PMHx:  afib, Breast CA, hearing impaired with cochlear implant, macular degen, HTN, lymphadema, osteoporosis, sleep apnea, stroke    PT Comments  Pt is progressing towards goals. Currently pt is Max A for bed mobility but sleeps in lift recliner at home, CGA for sit to stand and 40 ft if gait with RW. Pt spouse assists with mobility as needed at home and pt uses a W/C to get around for longer distances and when by herself. Spouse reports they can be with pt 24/7 on discharge as needed. Pt and spouse report pt has been ambulating slowly for years and that spouse has been assisting with mobility; both report pt is very close to baseline. Pt has been working with HHPT at home. Due to pt current functional status, home set up and available assistance at home recommending skilled physical therapy services 3x/week in order to address strength, balance and functional mobility to decrease risk for falls, injury and re-hospitalization.      If plan is discharge home, recommend the following: A little help with walking and/or transfers;A little help with bathing/dressing/bathroom;Assistance with cooking/housework;Assist for transportation;Help with stairs or ramp for entrance     Equipment Recommendations  None recommended by PT       Precautions / Restrictions Precautions Precautions: Fall Recall of Precautions/Restrictions: Intact Restrictions Weight Bearing Restrictions Per Provider Order: No     Mobility  Bed Mobility Overal bed mobility: Needs Assistance Bed Mobility: Sit to Supine       Sit to supine: Max assist   General bed mobility comments: pt sleeps in a lift recliner at home.    Transfers Overall transfer  level: Needs assistance   Transfers: Sit to/from Stand Sit to Stand: Contact guard assist, From elevated surface           General transfer comment: pt sits in W/C on [facility] and lift recliner at home. Spouse has gait belt.    Ambulation/Gait Ambulation/Gait assistance: Contact guard assist Gait Distance (Feet): 40 Feet Assistive device: Rolling walker (2 wheels) Gait Pattern/deviations: Step-through pattern   Gait velocity interpretation: <1.31 ft/sec, indicative of household ambulator   General Gait Details: short, tentative steps, but generally stable, started to fatigue around 30 ft. Spouse reports she has w/C she uses at home.    Balance Overall balance assessment: Needs assistance Sitting-balance support: No upper extremity supported, Feet supported Sitting balance-Leahy Scale: Good     Standing balance support: Bilateral upper extremity supported, During functional activity, Reliant on assistive device for balance Standing balance-Leahy Scale: Fair Standing balance comment: no overt LOB or buckling at the knees.      Communication Communication Factors Affecting Communication: Hearing impaired  Cognition Arousal: Alert Behavior During Therapy: WFL for tasks assessed/performed   PT - Cognitive impairments: No apparent impairments Difficult to assess due to: Hard of hearing/deaf     Following commands: Intact      Cueing Cueing Techniques: Verbal cues     General Comments General comments (skin integrity, edema, etc.): Spouse present and supportive throughout session      Pertinent Vitals/Pain Pain Assessment Pain Assessment: No/denies pain     PT Goals (current goals can now be found in the care plan section) Acute Rehab PT Goals Patient Stated  Goal: to go home PT Goal Formulation: With patient Time For Goal Achievement: 03/17/24 Potential to Achieve Goals: Good Progress towards PT goals: Progressing toward goals    Frequency    Min  2X/week      PT Plan  Continue with current POC        AM-PAC PT 6 Clicks Mobility   Outcome Measure  Help needed turning from your back to your side while in a flat bed without using bedrails?: A Little Help needed moving from lying on your back to sitting on the side of a flat bed without using bedrails?: A Lot Help needed moving to and from a bed to a chair (including a wheelchair)?: A Little Help needed standing up from a chair using your arms (e.g., wheelchair or bedside chair)?: A Little Help needed to walk in hospital room?: A Little Help needed climbing 3-5 steps with a railing? : A Lot 6 Click Score: 16    End of Session Equipment Utilized During Treatment: Gait belt Activity Tolerance: Patient tolerated treatment well;Patient limited by fatigue Patient left: in bed;with call bell/phone within reach;with bed alarm set;with family/visitor present Nurse Communication: Mobility status PT Visit Diagnosis: Unsteadiness on feet (R26.81);Other abnormalities of gait and mobility (R26.89)     Time: 8661-8577 PT Time Calculation (min) (ACUTE ONLY): 44 min  Charges:    $Therapeutic Activity: 38-52 mins PT General Charges $$ ACUTE PT VISIT: 1 Visit                    Dorothyann Maier, DPT, CLT  Acute Rehabilitation Services Office: [phone] (Secure chat preferred)    Dorothyann VEAR Maier 03/12/2024, 2:48 PM

## 2024-03-12 NOTE — Care Management Important Message (Signed)
 Important Message  Patient Details  Name: [name] MRN: [id number] Date of Birth: 09/07/1933   Important Message Given:  Yes - Medicare IM     Claretta Deed 03/12/2024, 4:26 PM

## 2024-03-12 NOTE — TOC Transition Note (Signed)
 Transition of Care [facility]) - Discharge Note   Patient Details  Name: [name] MRN: [id number] Date of Birth: 15-Jul-1933  Transition of Care [facility]) CM/SW Contact:  Andrez JULIANNA George, RN Phone Number: 03/12/2024, 3:17 PM   Clinical Narrative:     Pt will discharge home with home health services through [facility]. Pt was already active with [facility] Kasie with [facility] is aware of resumption orders.  No new DME needs.  Spouse is able to provide transportation home.  With pt permission, CM updated pts son via phone and he is on board with the plan.  Final next level of care: Home w Home Health Services Barriers to Discharge: No Barriers Identified   Patient Goals and CMS Choice   CMS Medicare.gov Compare Post Acute Care list provided to:: Patient Choice offered to / list presented to : Patient, Spouse      Discharge Placement                       Discharge Plan and Services Additional resources added to the After Visit Summary for                            [facility] Arranged: PT, OT, Nurse's Aide, Speech Therapy HH Agency:  [facility]) Date [facility] Agency Contacted: 03/12/24   Representative spoke with at [facility] Agency: Margarie  Social Drivers of Health (SDOH) Interventions SDOH Screenings   Food Insecurity: No Food Insecurity (03/12/2024)  Housing: High Risk (03/12/2024)  Transportation Needs: No Transportation Needs (03/12/2024)  Utilities: Not At Risk (03/12/2024)  Social Connections: Unknown (03/12/2024)  Tobacco Use: Low Risk  (03/08/2024)   Received from [facility]     Readmission Risk Interventions     No data to display

## 2024-03-12 NOTE — Progress Notes (Signed)
 DISCHARGE NOTE HOME [name] to be discharged Home per MD order. Discussed prescriptions and follow up appointments with the patient. Prescriptions given to patient; medication list explained in detail. Patient verbalized understanding.  Skin clean, dry and intact without evidence of skin break down, no evidence of skin tears noted. IV catheter discontinued intact. Site without signs and symptoms of complications. Dressing and pressure applied. Pt denies pain at the site currently. No complaints noted.  Patient free of lines, drains, and wounds.   An After Visit Summary (AVS) was printed and given to the patient. Patient escorted via wheelchair, and discharged home via private auto.  [name], RN

## 2024-03-12 NOTE — Progress Notes (Signed)
 Speech Language Pathology Treatment: Dysphagia  Patient Details Name: Daysi Boggan MRN: [id number] DOB: March 27, 1934 Today's Date: 03/12/2024 Time: 8997-8979 SLP Time Calculation (min) (ACUTE ONLY): 18 min  Assessment / Plan / Recommendation Clinical Impression  Diet advanced to mechanical altered solids with continued thin liquids. Diet can be downgraded back to puree solids if nursing, pt, or family has any concerns about her tolerance of ground solids. Esophagram anticipated later today.   Pt seen for SLP follow up for diet tolerance management and review of compensatory swallow strategies per MBSS results. This clinical research associate reviewed MBSS and reached out to attending physician with GI consult request due to upper esophageal outpouching that is trapping some solids and liquids and contributing to regurgitation and post prandial penetration. Esophagram is anticipated later today.   Despite her dysphagia, pt and family reported good PO intake of puree solids and thin liquids. They still observe frequent coughing with eating and drinking. We discussed option to evaluate tolerance of mechanical ground texture. Pt consumed x4-5 bites of softed cracker in applesauce. Observed delayed coughing instance x1 following cued throat clear + swallow.   We discussed option to try diet advancement to mechanical altered solids and see how she tolerates the consistency change during next meal (lunch) vs remain on puree foods. Pt opted to try diet advancement. If there are any swallowing concerns with diet advancement, SLP will modify diet back to puree solids.   Plan: SLP will follow up to review esophagram results and assess diet tolerance and modify or advance diet as indicated. Anticipate pt will need an outpatient GI referral.    HPI HPI: Ms. Magner presented to [facility] on 03/09/24 from urgent care with fevers/chills, SOB, congestion, sore throat, dysphagia, cough productive of mucous, changes to voice, difficulty  speaking and difficulty managing secretions/drooling. CT Head and CT Chest negative for acute processes. CT Neck revealed supraglottic laryngeal and hypopharyngeal edema with possible 1.9 cm  hemorrhage versus mass in this region. Pt scoped by ENT who's assessment is consistent with supraglottic edema with unclear etiology. Pt started on antibiotics and steriods. SLP completed MBSS on 12/13 with recommendation of a puree diet and thin liquids. Concern for outpouching in upper esophagus on MBSS. Requested GI consult on 12/15. Esophagram pending.      SLP Plan  New goals to be determined pending instrumental study (esophagram pending)        Recommendations  Diet recommendations: Dysphagia 2 (fine chop);Thin liquid Liquids provided via: Cup;Straw Medication Administration: Crushed with puree Supervision: Patient able to self feed;Full supervision/cueing for compensatory strategies Compensations: Slow rate;Small sips/bites;Clear throat intermittently;Multiple dry swallows after each bite/sip Postural Changes and/or Swallow Maneuvers: Seated upright 90 degrees;Upright 30-60 min after meal                  Oral care BID   Intermittent Supervision/Assistance Dysphagia, pharyngoesophageal phase (R13.14)     New goals to be determined pending instrumental study (esophagram pending)     Peyton JINNY Rummer  03/12/2024, 10:54 AM

## 2024-03-12 NOTE — Discharge Summary (Signed)
 Physician Discharge Summary  [name] FMW:[id number] DOB: 1934/01/13 DOA: 03/08/2024  PCP: Yolande Toribio MATSU, MD  Admit date: 03/08/2024 Discharge date: 03/12/2024  Time spent: 40 minutes  Recommendations for Outpatient Follow-up:  Follow outpatient CBC/CMP (attention to creatinine in follow up) Follow with ENT outpatient Follow severe esophageal dysmotility outpatient with GI  SLP to follow dysphagia with [facility]  Holding thiazide and arb at discharge, follow creatinine and blood pressure to determine timing/need to resume  Discharge Diagnoses:  Principal Problem:   Supraglottic mass   Discharge Condition: stable  Diet recommendation: fine chop  Filed Weights   03/09/24 0516 03/10/24 0352 03/12/24 0500  Weight: 77.6 kg 78.7 kg 80.7 kg    History of present illness:   89 year old woman who presented to [facility] ED 12/11 from UC for difficulty managing secretions. PMHx significant for  Patient presented to [facility] 12/11 for flu-like symptoms x 4 days. She noted fevers/chills, SOB, congestion, sore throat, dysphagia, cough productive of mucous. Also endorses changes to voice, difficulty speaking and difficulty managing secretions/drooling. Initial UC workup included COVID/Flu/strep testing (negative). Recommended presentation to ER for further care.   CT Soft Tissue Neck demonstrated supraglottic laryngeal and hypopharyngeal edema with possible 1.9 cm hemorrhage versus mass. ENT was consulted with recommendation for scope to evaluate/localize laryngeal swelling/?mass. Scope demonstrated supraglottic edema for which antibiotics and steroids were recommended with close monitoring in the ICU.   PCCM consulted for ICU admission.   Significant Events 12/11 - Presented to UC for flu-like symptoms x 4 days. Difficult controlling secretions, voice changes. CT Head NAICA, CT Soft Tissue Neck supraglottic laryngeal and hypopharyngeal edema with possible 1.9 cm hemorrhage versus mass. 12/12 - ENT  consulted, request ICU admission for close monitoring. 12/14 TRH assumed care Stable for discharge home with outpatient ENT follow up, [facility] SLP/PT/OT  Hospital Course:  Assessment and Plan:  Acute onset of supraglottic edema/mass - s/p ceftriaxone  and Decadron  - CT with supraglottic laryngeal and hypopharyngeal edema with possible 1.9 cm hemorrhage vs mass  - s/p laryngoscopy with ENT 12/12, unclear etiology - ENT planning to follow larynx outpatient to see if any future intervention needed for any potential mass - will discharge with 2 days of abx and medrol  dose pack (discussed with ENT)   Dysphagia Esophageal Dysmotility - fine chop at discharge - SLP to follow with [facility] - barium swallow with severe esophageal dysmotility - needs outpatient GI follow up  Steroid Induced Hyperglycemia - expect improvement with taper  HTN Afib (on Coumadin ) CVA - Cardiac monitoring - Optimize electrolytes for K > 4, Mg > 2 - resume warfarin as prescribed   OSA - Supplemental O2 support, wean FiO2 for O2 sat > 90% - Does not wear mask at night   Recurrent UTI UA nitrite positive, +leuks. Urine Cx +E.coli, proteus from care everywhere 12/8 labs. Discharging with augmentin    AKI on CKD stage 3B - stable creatinine to 1.9 today (baseline is 1.27) - possible etiologies include contrast related, hypovolemia/volume mediated, etc. - UA with protein, but 0-5 RBC's - suspect her kidney injury reaching plateau, I think ok to monitor outpatient - 600 cc out today so far, adequate.  Hold thiazide and arb at discharge.  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  -  speak slowly and  clearly    Procedures: 12/12 Flexible fibroptic laryngoscopy The scope was passed through the nose and tracked into the  nasopharynx. The nasopharynx without lesions or masses. The scope was positioned over the base of tongue and epiglottis.  There is Darrel Baroni lot of retained secretions overlying the arytenoids and glottis.  It clears occasionally.  The epiglottis itself looks normal.  The vocal cords are visualized by bringing the scope closer to the larynx but the arytenoids do overhanging the glottis creating some airway narrowing in the supraglottic region.  This swelling looks watery right there consistent with swelling not mass.  The vocal cords look like they move normally and they do not have any lesions.  She is not having any stridor.  The subglottis has minimal visualization but no lesions identified. The scope was removed without difficulty and patient tolerated well.    Consultations: ENT PCCM  Discharge Exam: Vitals:   03/12/24 1156 03/12/24 1517  BP: (!) 166/68 135/65  Pulse: 62 69  Resp: 19 18  Temp: 98.2 F (36.8 C) 98.4 F (36.9 C)  SpO2: 93% 94%   No issues swallowing today Discussed discharge plan - son and daughter in law and husband at bedside  General: No acute distress. Cardiovascular: RRR Lungs:unlabored Abdomen: Soft, nontender, nondistended  Neurological: Alert and oriented 3. Moves all extremities 4 . Cranial nerves II through XII grossly intact. Extremities: No clubbing or cyanosis. No edema.  Discharge Instructions   Discharge Instructions     Call MD for:  difficulty breathing, headache or visual disturbances   Complete by: As directed    Call MD for:  extreme fatigue   Complete by: As directed    Call MD for:  hives   Complete by: As directed    Call MD for:  persistant dizziness or light-headedness   Complete by: As directed    Call MD for:  persistant nausea and vomiting   Complete by: As directed    Call MD for:  redness, tenderness, or signs of infection (pain, swelling, redness, odor or green/yellow discharge around incision site)   Complete by: As directed     Call MD for:  severe uncontrolled pain   Complete by: As directed    Call MD for:  temperature >100.4   Complete by: As directed    DIET DYS 2   Complete by: As directed    Fluid consistency: Thin   Discharge instructions   Complete by: As directed    You were seen for supraglottic swelling.  You've improved with antibiotics and steroids.  We'll send you home with another 2 days of antibiotics and Averill Pons steroid taper.  You have some steroid induced high blood sugars, but this will improve as we taper your steroid dose.    Call Dr. Roark for Jonaya Freshour follow up appointment.  They'll decide if you need additional procedures or workup outpatient for the swelling or possible mass.  Continue [name] dysphagia 2 diet (fine chop) outpatient.  Speech therapy will continue to follow you outpatient.  You had an esophagram that showed severe dysmotility (abnormal propulsion of food down the esophagus).  You should follow this up with gastroenterology as an outpatient.  You have acute kidney injury.  This could be related to contrast, dehydration, or other issues.  I expect this should improve outpatient.  Repeat labs within 1 week with your PCP.  Don't take your chlorthalidone  or losartan  until you follow up labs with your PCP and they say it's ok.  Avoid NSAIDs.  Return for new, recurrent, or worsening symptoms.  Please ask your PCP to request records from this hospitalization so they know what was done and what the next steps will be.   Face-to-face encounter (required for Medicare/Medicaid patients)   Complete by: As directed    I Meliton Monte certify that this patient is under my care and that I, or [name] nurse practitioner or physician's assistant working with me, had [name] face-to-face encounter that meets the physician face-to-face encounter requirements with this patient on 03/12/2024. The encounter with the patient was in whole, or in part for the following medical condition(s) which is the primary reason for home  health care (List medical condition): supraglottic edema   The encounter with the patient was in whole, or in part, for the following medical condition, which is the primary reason for home health care: supraglottic edema   I certify that, based on my findings, the following services are medically necessary home health services:  Speech language pathology Physical therapy     Reason for Medically Necessary Home Health Services: Therapy- Therapeutic Exercises to Increase Strength and Endurance   My clinical findings support the need for the above services: Unable to leave home safely without assistance and/or assistive device   Further, I certify that my clinical findings support that this patient is homebound due to: Unable to leave home safely without assistance   Home Health   Complete by: As directed    To provide the following care/treatments: SLP   Increase activity slowly   Complete by: As directed       Allergies as of 03/12/2024       Reactions   Pradaxa [dabigatran Etexilate Mesylate] Other (See Comments)   INTERNAL BLEEDING   Clindamycin/lincomycin Other (See Comments)   PT STATES HER DOCTOR TOLD HER NOT TO TAKE CLINDAMYCIN BECAUSE SHE GOT C-DIFF AFTER TAKING AMPICILLIN - tolerated in 2022 (per spouse)   Latex Other (See Comments)   Blisters   Ampicillin  Other (See Comments)   C. Diff after taking ampicillin  - tolerated in 2022 (per husband) Pt has received cephalosporins in 2013, 2014, 2017, 2019, 2022   Sulfamethoxazole Diarrhea        Medication List     PAUSE taking these medications    chlorthalidone  25 MG tablet Wait to take this until your doctor or other care provider tells you to start again. Follow up with your PCP for repeat labs before resuming this Commonly known as: HYGROTON  Take 25 mg by mouth daily.   losartan  100 MG tablet Wait to take this until your doctor or other care provider tells you to start again. Follow repeat labs before resuming  this with your PCP Commonly known as: COZAAR  Take 100 mg by mouth daily.       TAKE these medications    acetaminophen  325 MG tablet Commonly known as: TYLENOL  Take 2 tablets (650 mg total) by mouth every 6 (six) hours as needed for mild pain (or Fever >/= 101).   amoxicillin -clavulanate 500-125 MG tablet Commonly known as: Augmentin  Take 1 tablet by mouth 2 (two) times daily for 2 days.   atorvastatin  20 MG tablet Commonly known as: LIPITOR Take 1 tablet (20 mg total) by mouth daily after supper.   estradiol 0.1 MG/GM vaginal cream Commonly known as: ESTRACE Place 1 Applicatorful vaginally 2 (two) times Chelsye Suhre week.   methylPREDNISolone  4 MG Tbpk tablet Commonly known as: MEDROL  DOSEPAK Take as prescribed   metoprolol  succinate 25 MG  24 hr tablet Commonly known as: TOPROL -XL Take 0.5 tablets (12.5 mg total) by mouth daily.   pantoprazole  40 MG tablet Commonly known as: PROTONIX  Take 1 tablet (40 mg total) by mouth daily.   PreserVision AREDS Caps Take 1 capsule by mouth 2 (two) times daily.   One-Menucha Dicesare-Day Proactive 65+ Tabs Take 1 tablet by mouth daily with breakfast.   Systane 0.4-0.3 % Gel ophthalmic gel Generic drug: Polyethyl Glycol-Propyl Glycol Place 1 Application into both eyes at bedtime.   vitamin D3 25 MCG tablet Commonly known as: CHOLECALCIFEROL  Take 1 tablet (1,000 Units total) by mouth daily.   VSL#3 PO Take 1 capsule by mouth in the morning.   warfarin 5 MG tablet Commonly known as: Coumadin  Take 1 tablet (5 mg total) by mouth daily. What changed: how much to take       Allergies[1]  Contact information for follow-up providers     Roark Rush, MD Follow up.   Specialty: Otolaryngology Why: call for [name] follow up appointment Contact information: [address] [place] [address] [phone]              Contact information for after-discharge care     Home Medical Care     [facility]) .    Service: Home Health Services Contact information: [address] [place]  [phone]                      The results of significant diagnostics from this hospitalization (including imaging, microbiology, ancillary and laboratory) are listed below for reference.    Significant Diagnostic Studies: DG ESOPHAGUS W SINGLE CM (SOL OR THIN BA) Result Date: 03/12/2024 CLINICAL DATA:  89 year old female. Endorsing dysphagia. Request is for esophagram for further evaluation. EXAM: ESOPHAGUS/BARIUM SWALLOW/TABLET STUDY TECHNIQUE: Single contrast examination was performed using thin liquid barium. This exam was performed by Delon Beagle NP, and was supervised and interpreted by Dr. Cordella Banner. FLUOROSCOPY: Radiation Exposure Index (as provided by the fluoroscopic device): 11.7 mGy Kerma COMPARISON:  None Available. FINDINGS: Swallowing: Appears normal. No vestibular penetration or aspiration seen. Pharynx: Unremarkable. Esophagus: Normal appearance. Esophageal motility: Severe dysmotility with marked delay of passage of contrast eventually passing into the stomach Hiatal Hernia: None. Gastroesophageal reflux: None visualized. Ingested 13mm barium tablet: Patient declined Other: None. IMPRESSION: Limited evaluation of the esophagus. Severe esophageal dysmotility with esophageal stasis of contrast eventually passing into the stomach. Patient declined barium tablet No masses, lesions. Electronically Signed   By: Cordella Banner   On: 03/12/2024 14:26   US  RENAL Result Date: 03/11/2024 EXAM: US  Retroperitoneum Complete, Renal. 03/11/2024 09:53:12 PM TECHNIQUE: Real-time ultrasonography of the retroperitoneum renal was performed. COMPARISON: None available CLINICAL HISTORY: [id number] AKI (acute kidney injury) 409830 AKI (acute kidney injury) FINDINGS: FINDINGS: RIGHT KIDNEY/URETER: Right kidney measures 9.1 x 4.1 x 3.8 cm. Calculated volume is 74 ml. Normal  cortical echogenicity. No hydronephrosis. No calculus. No mass. LEFT KIDNEY/URETER: Left kidney measures 9.0 x 5.5 x 5.3 cm. Calculated volume is 137 ml. Christerpher Clos 4.2 cm simple cyst is noted in the mid portion of the left kidney. Normal cortical echogenicity. No hydronephrosis. No calculus. BLADDER: Unremarkable appearance of the bladder. IMPRESSION: 1. No acute findings. 2. Simple left renal cyst measuring 4.2 cm. No clinical follow-up is recommended Electronically signed by: Oneil Devonshire MD 03/11/2024 10:07 PM EST RP Workstation: [email or web address] [email or web address] Swallowing Func-Speech Pathology Result Date: 03/10/2024 Table formatting from the original  result was not included. Images from the original result were not included. Modified Barium Swallow Study Patient Details Name: Kandace Elrod MRN: [id number] Date of Birth: [birth year] Today's Date: 03/10/2024 HPI/PMH: HPI: Ms. Wernli presented to [facility] on 03/09/24 from urgent care with fevers/chills, SOB, congestion, sore throat, dysphagia, cough productive of mucous, changes to voice, difficulty speaking and difficulty managing secretions/drooling. CT Head and CT Chest negative for acute processes. CT Neck revealed supraglottic laryngeal and hypopharyngeal edema with possible 1.9 cm  hemorrhage versus mass in this region. Pt scoped by ENT who's assessment is consistent with supraglottic edema with unclear etiology. Pt started on antibiotics and steriods. SLP consulted for clinical swallow evaluation. Clinical Impression Patient presents with [name] pharyngoesophageal dysphagia with esophageal component likely chronic as per this MBS. Oral phase appears WFL aside from patient's inability to masticate graham cracker. Appearance of prominent cricopharyngeal bar noted which resulted in incomplete transit of bolus past bar and retrograde movement of barium that remained above the cricopharyngeal bar back up through the PES. This occured with all bolus consistencies and resulted in barium transiting  back into pyriform sinus as well as instances of penetration above the vocal cords with thin, nectar thick, honey thick and puree that did not clear (PAS 3) and penetration to the vocal cords (PAS 5) with thin and nectar thick liquids. No instances of aspiration observed during this study. Patient indicated swallowing felt easier with head turn to right. Retrograde movement of barium occured regardless of any swallow strategy. Most effective compensation was [name] throat clear after initial swallow, followed by [name] second swallow. Patient and spouse educated on these recommendations and both verbalized understanding and agreement. Factors that may increase risk of adverse event in presence of aspiration Noe & Lianne 2021): Frail or deconditioned;Limited mobility;Poor general health and/or compromised immunity DIGEST Swallow Severity Rating*  Safety: 2  Efficiency: 1  Overall Pharyngeal Swallow Severity: 2 1: mild; 2: moderate; 3: severe; 4: profound *The Dynamic Imaging Grade of Swallowing Toxicity is standardized for the head and neck cancer population, however, demonstrates promising clinical applications across populations to standardize the clinical rating of pharyngeal swallow safety and severity. Recommendations/Plan: Swallowing Evaluation Recommendations Swallowing Evaluation Recommendations Recommendations: PO diet PO Diet Recommendation: Dysphagia 1 (Pureed); Thin liquids (Level 0) Liquid Administration via: Cup; Straw Medication Administration: Crushed with puree Supervision: Patient able to self-feed Swallowing strategies  : Slow rate; Small bites/sips; Multiple dry swallows after each bite/sip; Clear throat intermittently; Hard cough after swallowing Postural changes: Position pt fully upright for meals; Stay upright 30-60 min after meals Oral care recommendations: Oral care BID (2x/day) Treatment Plan Treatment Plan Treatment recommendations: Therapy as outlined in treatment plan below Follow-up  recommendations: Outpatient SLP Functional status assessment: Patient has had Chasiti Waddington recent decline in their functional status and demonstrates the ability to make significant improvements in function in Valrie Jia reasonable and predictable amount of time. Treatment frequency: Min 2x/week Treatment duration: 1 week Interventions: Aspiration precaution training; Diet toleration management by SLP; Compensatory techniques; Patient/family education; Trials of upgraded texture/liquids Recommendations Recommendations for follow up therapy are one component of Tanice Petre multi-disciplinary discharge planning process, led by the attending physician.  Recommendations may be updated based on patient status, additional functional criteria and insurance authorization. Assessment: Orofacial Exam: Orofacial Exam Oral Cavity - Dentition: Adequate natural dentition Oral Motor/Sensory Function: Suspected cranial nerve impairment CN V - Trigeminal: Right motor impairment CN VII - Facial: Right motor impairment Anatomy: Anatomy: Prominent cricopharyngeus Boluses Administered: Boluses Administered Boluses Administered: Thin liquids (  Level 0); Mildly thick liquids (Level 2, nectar thick); Moderately thick liquids (Level 3, honey thick); Puree  Oral Impairment Domain: Oral Impairment Domain Lip Closure: No labial escape Tongue control during bolus hold: Cohesive bolus between tongue to palatal seal Bolus preparation/mastication: Timely and efficient chewing and mashing Bolus transport/lingual motion: Brisk tongue motion Oral residue: Complete oral clearance Location of oral residue : N/[name] Initiation of pharyngeal swallow : Valleculae  Pharyngeal Impairment Domain: Pharyngeal Impairment Domain Soft palate elevation: No bolus between soft palate (SP)/pharyngeal wall (PW) Laryngeal elevation: Complete superior movement of thyroid  cartilage with complete approximation of arytenoids to epiglottic petiole Anterior hyoid excursion: Complete anterior movement  Epiglottic movement: Partial inversion Laryngeal vestibule closure: Complete, no air/contrast in laryngeal vestibule Pharyngeal stripping wave : Present - complete Pharyngeal contraction ([name]/P view only): N/[name] Pharyngoesophageal segment opening: Complete distension and complete duration, no obstruction of flow Tongue base retraction: No contrast between tongue base and posterior pharyngeal wall (PPW) Pharyngeal residue: Collection of residue within or on pharyngeal structures Location of pharyngeal residue: Pyriform sinuses  Esophageal Impairment Domain: Esophageal Impairment Domain Esophageal clearance upright position: Esophageal retention with retrograde flow through the PES Pill: No data recorded Penetration/Aspiration Scale Score: Penetration/Aspiration Scale Score 3.  Material enters airway, remains ABOVE vocal cords and not ejected out: Thin liquids (Level 0); Mildly thick liquids (Level 2, nectar thick); Moderately thick liquids (Level 3, honey thick); Puree 5.  Material enters airway, CONTACTS cords and not ejected out: Thin liquids (Level 0); Mildly thick liquids (Level 2, nectar thick) Compensatory Strategies: Compensatory Strategies Compensatory strategies: Yes Multiple swallows: Effective Effective Multiple Swallows: Thin liquid (Level 0); Mildly thick liquid (Level 2, nectar thick); Puree Chin tuck: Ineffective Ineffective Chin Tuck: Mildly thick liquid (Level 2, nectar thick) Left head turn: Ineffective Ineffective Left Head Turn: Mildly thick liquid (Level 2, nectar thick) Right head turn: Effective Effective Right Head Turn: Thin liquid (Level 0); Mildly thick liquid (Level 2, nectar thick)   General Information: Caregiver present: Yes  Diet Prior to this Study: Thin liquids (Level 0); Clear liquid diet   Temperature : Normal   Respiratory Status: WFL   Supplemental O2: None (Room air)   History of Recent Intubation: No  Behavior/Cognition: Alert; Cooperative Self-Feeding Abilities: Able to self-feed  Baseline vocal quality/speech: Normal Volitional Cough: Able to elicit Volitional Swallow: Able to elicit Exam Limitations: No limitations Goal Planning: Prognosis for improved oropharyngeal function: Fair Barriers to Reach Goals: Time post onset; Severity of deficits No data recorded Patient/Family Stated Goal: patient wants to discharge ASAP Consulted and agree with results and recommendations: Patient; Family member/caregiver Pain: Pain Assessment Pain Assessment: No/denies pain End of Session: Start Time:SLP Start Time (ACUTE ONLY): 1316 Stop Time: SLP Stop Time (ACUTE ONLY): 1338 Time Calculation:SLP Time Calculation (min) (ACUTE ONLY): 22 min Charges: SLP Evaluations $ SLP Speech Visit: 1 Visit SLP Evaluations $BSS Swallow: 1 Procedure $MBS Swallow: 1 Procedure SLP visit diagnosis: SLP Visit Diagnosis: Dysphagia, pharyngoesophageal phase (R13.14) Past Medical History: Past Medical History: Diagnosis Date  Arthritis   fingers, hips, feet, ankles (11/10/2012)  Asthma   Atrial fibrillation (HCC)   CHRONIC COUMADIN   Breast cancer (HCC) 1990's  cancer on one side; precancerous tissue on the other (11/10/2012)  Chronic bronchitis (HCC)   multiple times; not in [place] Inch long time (11/10/2012  Depression   GERD (gastroesophageal reflux disease)   Hearing impaired   Hypertension   Irritable bowel   Lymphedema   HX OF - IN LEFT ARM--NO NEEDLES OR B/P'S  LEFT ARM  Macular degeneration   BEGINNINGS OF MACULAR DEGENERATION  Osteoporosis   Pneumonia   multiple times; not in Ralpheal [name] long time (11/10/2012)  Recurrent UTI   Sleep apnea   dx'd w/it; don't wear mask or anything (11/10/2012)  Stroke (HCC) ~ 2005  HX OF TIA-NO RESIDUAL PROBLEM--PARALYSIS RT SIDE FACE /PT'S MOUTH DROOPS-AND LOSS OF HEARING RT EAR --SINCE EAR SURGERY AS Marisela Line CHILD   UTI (lower urinary tract infection)   FREQUENT Past Surgical History: Past Surgical History: Procedure Laterality Date  BREAST BIOPSY Bilateral 1990's  Dacryocystorhinostomy  02/18/2016  INNER EAR  SURGERY Right   MULTIPLE EAR SURGERIES,  INTRAMEDULLARY (IM) NAIL INTERTROCHANTERIC Left 04/23/2020  Procedure: INTRAMEDULLARY (IM) NAIL INTERTROCHANTRIC;  Surgeon: Kendal Franky SQUIBB, MD;  Location: [facility] OR;  Service: Orthopedics;  Laterality: Left;  JOINT REPLACEMENT    KNEE ARTHROSCOPY  04/07/2012  Procedure: ARTHROSCOPY KNEE;  Surgeon: Dempsey LULLA Moan, MD;  Location: [facility] [employer];  Service: Orthopedics;  Laterality: Left;  WITH SYNOVECTOMY  MASTECTOMY Bilateral 1990's  MASTOIDECTOMY Right 1942  ORIF FEMUR FRACTURE Left 08/08/2017  Procedure: OPEN REDUCTION INTERNAL FIXATION (ORIF) DISTAL FEMUR FRACTURE;  Surgeon: Kendal Franky SQUIBB, MD;  Location: [facility];  Service: Orthopedics;  Laterality: Left;  TONSILLECTOMY    TOTAL KNEE ARTHROPLASTY  09/20/2011  LEFT TOTAL KNEE ARTHROPLASTY;  Surgeon: Dempsey LULLA Moan, MD;  Location: [facility];  Service: Orthopedics;  Laterality: Left;  TOTAL KNEE ARTHROPLASTY  2006 Norleen IVAR Blase, MA, CCC-SLP Speech Therapy 03/10/2024, 3:16 PM  CT Soft Tissue Neck W Contrast Result Date: 03/09/2024 EXAM: CT NECK WITH CONTRAST 03/08/2024 11:10:11 PM TECHNIQUE: CT of the neck was performed with the administration of intravenous contrast. Multiplanar reformatted images are provided for review. Automated exposure control, iterative reconstruction, and/or weight based adjustment of the mA/kV was utilized to reduce the radiation dose to as low as reasonably achievable. COMPARISON: None available. CLINICAL HISTORY: Difficulty tolerating secretions FINDINGS: AERODIGESTIVE TRACT: Approximately 1.9 x 0.8 cm area of hyperdensity that appears to be located in the region of the anterior hypopharyngeal wall on series 503 image 56 above the cricoid cartilage. Extensive surrounding edema that extends posteriorly into the hypopharynx and more superiorly to the right aryepiglottic fold. Associated mild narrowing of the airway which is patent and probable mass effect on the esophagus. SALIVARY GLANDS: The  parotid and submandibular glands are unremarkable. THYROID : Unremarkable. LYMPH NODES: No suspicious cervical lymphadenopathy. BRAIN, ORBITS, SINUSES AND MASTOIDS: No acute abnormality. LUNGS AND MEDIASTINUM: No acute abnormality. BONES: No focal bone abnormality.  Severe degenerative changes. Findings and recommendations discussed with Richerd Later via telephone at 12:03 AM. IMPRESSION: 1. Supraglottic laryngeal and hypopharyngeal edema with possible 1.9 cm hemorrhage versus mass in this region as detailed above. Findings are indeterminant but could be secondary to malignancy, infection, or trauma. Recommend ENT consultation and direct visualization. Electronically signed by: Gilmore Molt 03/09/2024 12:10 AM EST RP Workstation: [id number]   CT Chest W Contrast Result Date: 03/08/2024 EXAM: CT CHEST WITH CONTRAST 03/08/2024 11:10:11 PM TECHNIQUE: CT of the chest was performed with the administration of 75 mL of iohexol  (OMNIPAQUE ) 350 MG/ML injection. Multiplanar reformatted images are provided for review. Automated exposure control, iterative reconstruction, and/or weight based adjustment of the mA/kV was utilized to reduce the radiation dose to as low as reasonably achievable. COMPARISON: 11/04/2015 CLINICAL HISTORY: sob, c/f aspiration FINDINGS: MEDIASTINUM: Moderate cardiomegaly. Moderate 3-vessel coronary atherosclerosis. Thoracic Aortic atherosclerosis. Pericardium is unremarkable. The central airways are clear. LYMPH NODES: No mediastinal, hilar  or axillary lymphadenopathy. LUNGS AND PLEURA: Scattered atelectasis/scarring, chronic. No pulmonary edema. No pleural effusion or pneumothorax. SOFT TISSUES/BONES: Mildly exaggerated upper and mid thoracic kyphosis. No acute abnormality of the soft tissues. UPPER ABDOMEN: Limited images of the upper abdomen demonstrates no acute abnormality. IMPRESSION: 1. No acute cardiopulmonary process. Electronically signed by: Pinkie Pebbles MD 03/08/2024 11:19 PM  EST RP Workstation: [id number]   CT Head Wo Contrast Result Date: 03/08/2024 EXAM: CT HEAD WITHOUT 03/08/2024 11:10:11 PM TECHNIQUE: CT of the head was performed without the administration of intravenous contrast. Automated exposure control, iterative reconstruction, and/or weight based adjustment of the mA/kV was utilized to reduce the radiation dose to as low as reasonably achievable. COMPARISON: 08/06/2017 CLINICAL HISTORY: difficulty tolerating secretions FINDINGS: BRAIN AND VENTRICLES: No acute intracranial hemorrhage. No mass effect or midline shift. No extra-axial fluid collection. No evidence of acute infarct. No hydrocephalus. Age-related atrophy. Old left cerebellar infarct. Moderate periventricular and deep cerebral white matter disease. Old lacunar infarct within the right basal ganglia. Intracranial atherosclerosis. ORBITS: Status post bilateral lens replacement. SINUSES AND MASTOIDS: Small air-fluid level within the right maxillary sinus. Postsurgical changes of the right mastoid. SOFT TISSUES AND SKULL: Left cochlear implant with streak artifact. No acute skull fracture. No acute soft tissue abnormality. IMPRESSION: 1. No acute intracranial abnormality. Electronically signed by: Pinkie Pebbles MD 03/08/2024 11:17 PM EST RP Workstation: [id number]   DG Chest 2 View Result Date: 03/08/2024 EXAM: 2 VIEW(S) XRAY OF THE CHEST 03/08/2024 08:57:00 PM COMPARISON: 03/06/2021. CLINICAL HISTORY: congestion FINDINGS: LUNGS AND PLEURA: Vascular congestion. No overt edema. No focal pulmonary opacity. No pleural effusion. No pneumothorax. HEART AND MEDIASTINUM: Cardiomegaly. Aortic atherosclerosis. BONES AND SOFT TISSUES: No acute osseous abnormality. IMPRESSION: 1. Vascular congestion without overt edema. 2. Cardiomegaly with aortic atherosclerosis. Electronically signed by: Franky Crease MD 03/08/2024 09:05 PM EST RP Workstation: [id number]    Microbiology: Recent Results (from the past 240 hours)   Urine Culture     Status: Abnormal   Collection Time: 03/08/24  8:11 PM   Specimen: Urine, Random  Result Value Ref Range Status   Specimen Description URINE, RANDOM  Final   Special Requests   Final    NONE Reflexed from [id number] Performed at [facility], [address], [place], [place] [address]    Culture MULTIPLE SPECIES PRESENT, SUGGEST RECOLLECTION (Jolan Mealor)  Final   Report Status 03/09/2024 FINAL  Final  Resp panel by RT-PCR (RSV, Flu Edison Wollschlager&B, Covid) Anterior Nasal Swab     Status: None   Collection Time: 03/08/24  8:31 PM   Specimen: Anterior Nasal Swab  Result Value Ref Range Status   SARS Coronavirus 2 by RT PCR NEGATIVE NEGATIVE Final   Influenza Kimbella Heisler by PCR NEGATIVE NEGATIVE Final   Influenza B by PCR NEGATIVE NEGATIVE Final    Comment: (NOTE) The Xpert Xpress SARS-CoV-2/FLU/RSV plus assay is intended as an aid in the diagnosis of influenza from Nasopharyngeal swab specimens and should not be used as Aleane Wesenberg sole basis for treatment. Nasal washings and aspirates are unacceptable for Xpert Xpress SARS-CoV-2/FLU/RSV testing.  Fact Sheet for Patients: bloggercourse.com  Fact Sheet for Healthcare Providers: seriousbroker.it  This test is not yet approved or cleared by the [place]  FDA and has been authorized for detection and/or diagnosis of SARS-CoV-2 by FDA under an Emergency Use Authorization (EUA). This EUA will remain in effect (meaning this test can be used) for the duration of the COVID-19 declaration under Section 564(b)(1) of the Act, 21 U.S.C. section 360bbb-3(b)(1),  unless the authorization is terminated or revoked.     Resp Syncytial Virus by PCR NEGATIVE NEGATIVE Final    Comment: (NOTE) Fact Sheet for Patients: bloggercourse.com  Fact Sheet for Healthcare Providers: seriousbroker.it  This test is not yet approved or cleared by the [place]  FDA and has  been authorized for detection and/or diagnosis of SARS-CoV-2 by FDA under an Emergency Use Authorization (EUA). This EUA will remain in effect (meaning this test can be used) for the duration of the COVID-19 declaration under Section 564(b)(1) of the Act, 21 U.S.C. section 360bbb-3(b)(1), unless the authorization is terminated or revoked.  Performed at [facility], [address], [place], [place] [address]   MRSA Next Gen by PCR, Nasal     Status: None   Collection Time: 03/09/24  5:10 AM   Specimen: Nasal Mucosa; Nasal Swab  Result Value Ref Range Status   MRSA by PCR Next Gen NOT DETECTED NOT DETECTED Final    Comment: (NOTE) The GeneXpert MRSA Assay (FDA approved for NASAL specimens only), is one component of Argelia Formisano comprehensive MRSA colonization surveillance program. It is not intended to diagnose MRSA infection nor to guide or monitor treatment for MRSA infections. Test performance is not FDA approved in patients less than 103 years old. Performed at [facility], [address] [place], [place] [address]      Labs: Basic Metabolic Panel: Recent Labs  Lab 03/08/24 2037 03/09/24 0207 03/10/24 0150 03/11/24 0629 03/12/24 0427  NA 136 136 137 138 141  K 3.9 4.3 4.0 3.9 3.9  CL 103 104 106 106 107  CO2 22 22 22 23 26   GLUCOSE 117* 133* 127* 168* 117*  BUN 28* 27* 31* 49* 52*  CREATININE 1.51* 1.46* 1.55* 1.97* 1.94*  CALCIUM  8.5* 8.4* 8.5* 8.1* 8.0*  MG  --  1.6*  --  2.2 2.1  PHOS  --  3.3  --  2.8 2.9   Liver Function Tests: Recent Labs  Lab 03/08/24 2037 03/12/24 0427  AST 18 15  ALT 15 14  ALKPHOS 138* 94  BILITOT 0.9 0.6  PROT 7.1 5.9*  ALBUMIN  3.6 2.8*   No results for input(s): LIPASE, AMYLASE in the last 168 hours. No results for input(s): AMMONIA in the last 168 hours. CBC: Recent Labs  Lab 03/08/24 2037 03/09/24 0207 03/10/24 0150 03/12/24 0427  WBC 9.3 11.0* 12.1* 12.7*  NEUTROABS 7.1  --  10.7* 10.9*  HGB 10.1* 10.6*  10.0* 9.6*  HCT 31.9* 32.2* 30.2* 29.4*  MCV 89.6 89.2 86.8 87.0  PLT 248 242 253 258   Cardiac Enzymes: No results for input(s): CKTOTAL, CKMB, CKMBINDEX, TROPONINI in the last 168 hours. BNP: BNP (last 3 results) Recent Labs    03/08/24 2037  BNP 425.2*    ProBNP (last 3 results) No results for input(s): PROBNP in the last 8760 hours.  CBG: Recent Labs  Lab 03/12/24 0104 03/12/24 0406 03/12/24 0755 03/12/24 1219 03/12/24 1543  GLUCAP 152* 121* 148* 181* 212*       Signed:  Meliton Monte MD.  [facility] 03/12/2024, 3:49 PM       [1]  Allergies Allergen Reactions   Pradaxa [Dabigatran Etexilate Mesylate] Other (See Comments)    INTERNAL BLEEDING   Clindamycin/Lincomycin Other (See Comments)    PT STATES HER DOCTOR TOLD HER NOT TO TAKE CLINDAMYCIN BECAUSE SHE GOT C-DIFF AFTER TAKING AMPICILLIN - tolerated in 2022 (per spouse)   Latex Other (See Comments)  Blisters    Ampicillin  Other (See Comments)    C. Diff after taking ampicillin  - tolerated in 2022 (per husband) Pt has received cephalosporins in 2013, 2014, 2017, 2019, 2022    Sulfamethoxazole Diarrhea

## 2024-03-16 ENCOUNTER — Telehealth (INDEPENDENT_AMBULATORY_CARE_PROVIDER_SITE_OTHER): Payer: Self-pay | Admitting: Otolaryngology

## 2024-03-16 NOTE — Telephone Encounter (Signed)
 I returned the patient's spouse's voicemail. He left 2 numbers to call back to schedule an ER follow up with Dr Roark for a scope to evaluate and localize laryngeal swelling and possible mass.  I spoke with the patient's son on the first number ( [phone]) who told me to call the second number ( [phone]). I  was unable to leave a message on the second number.

## 2024-03-27 ENCOUNTER — Ambulatory Visit (INDEPENDENT_AMBULATORY_CARE_PROVIDER_SITE_OTHER): Admitting: Otolaryngology

## 2024-03-27 ENCOUNTER — Encounter (INDEPENDENT_AMBULATORY_CARE_PROVIDER_SITE_OTHER): Payer: Self-pay | Admitting: Otolaryngology

## 2024-03-27 VITALS — Ht 64.0 in | Wt 160.0 lb

## 2024-03-27 DIAGNOSIS — J384 Edema of larynx: Secondary | ICD-10-CM

## 2024-03-27 NOTE — Progress Notes (Signed)
 Reason for Consult: Laryngeal mass Referring Physician: Dr. Jama Ivanoff [name] is an 89 y.o. female.  HPI: She is here for follow-up from the emergency room/hospital.  She had swelling of her larynx and was admitted to the intensive care unit.  She then underwent treatment and improved to the point where she was back to her baseline and was discharged.  She is now here for follow-up of findings of her CT scan which did suggest the possibility of a arytenoid mass.  She feels great at this point.  She has no complaints.  She has no dysphagia or odynophagia.  No sore throat or throat pain.  Does not feel any lump in her throat.  Her voice is normal.  Past Medical History:  Diagnosis Date   Arthritis    fingers, hips, feet, ankles (11/10/2012)   Asthma    Atrial fibrillation (HCC)    CHRONIC COUMADIN    Breast cancer (HCC) 1990's   cancer on one side; precancerous tissue on the other (11/10/2012)   Chronic bronchitis (HCC)    multiple times; not in a long time (11/10/2012   Depression    GERD (gastroesophageal reflux disease)    Hearing impaired    Hypertension    Irritable bowel    Lymphedema    HX OF - IN LEFT ARM--NO NEEDLES OR B/P'S LEFT ARM   Macular degeneration    BEGINNINGS OF MACULAR DEGENERATION   Osteoporosis    Pneumonia    multiple times; not in a long time (11/10/2012)   Recurrent UTI    Sleep apnea    dx'd w/it; don't wear mask or anything (11/10/2012)   Stroke (HCC) ~ 2005   HX OF TIA-NO RESIDUAL PROBLEM--PARALYSIS RT SIDE FACE /PT'S MOUTH DROOPS-AND LOSS OF HEARING RT EAR --SINCE EAR SURGERY AS A CHILD    UTI (lower urinary tract infection)    FREQUENT    Past Surgical History:  Procedure Laterality Date   BREAST BIOPSY Bilateral 1990's   Dacryocystorhinostomy  02/18/2016   INNER EAR SURGERY Right    MULTIPLE EAR SURGERIES,   INTRAMEDULLARY (IM) NAIL INTERTROCHANTERIC Left 04/23/2020   Procedure: INTRAMEDULLARY (IM) NAIL INTERTROCHANTRIC;  Surgeon:  Kendal Franky SQUIBB, MD;  Location: [facility];  Service: Orthopedics;  Laterality: Left;   JOINT REPLACEMENT     KNEE ARTHROSCOPY  04/07/2012   Procedure: ARTHROSCOPY KNEE;  Surgeon: Dempsey LULLA Moan, MD;  Location: [facility];  Service: Orthopedics;  Laterality: Left;  WITH SYNOVECTOMY   MASTECTOMY Bilateral 1990's   MASTOIDECTOMY Right 1942   ORIF FEMUR FRACTURE Left 08/08/2017   Procedure: OPEN REDUCTION INTERNAL FIXATION (ORIF) DISTAL FEMUR FRACTURE;  Surgeon: Kendal Franky SQUIBB, MD;  Location: [facility];  Service: Orthopedics;  Laterality: Left;   TONSILLECTOMY     TOTAL KNEE ARTHROPLASTY  09/20/2011   LEFT TOTAL KNEE ARTHROPLASTY;  Surgeon: Dempsey LULLA Moan, MD;  Location: [facility];  Service: Orthopedics;  Laterality: Left;   TOTAL KNEE ARTHROPLASTY  2006    Family History  Problem Relation Age of Onset   CAD Mother    Stroke Mother    CAD Father     Social History:  reports that she has never smoked. She has never used smokeless tobacco. She reports that she does not drink alcohol  and does not use drugs.  Allergies: Allergies[1]   No results found for this or any previous visit (from the past 48 hours).  No results found.  ROS Pulse 73, height 5' 4 (1.626  m), weight 160 lb (72.6 kg). Physical Exam Constitutional:      Appearance: Normal appearance.  HENT:     Head: Normocephalic and atraumatic.     Right Ear: Tympanic membrane is without lesions and middle ear aerated, ear canal and external ear normal.     Left Ear: Tympanic membrane is without lesions and middle ear aerated, ear canal and external ear normal.     Nose: Nose without deviation of septum.  Turbinates with mild hypertrophy, No significant swelling or masses.     Oral cavity/oropharynx: Mucous membranes are moist. No lesions or masses    Larynx: normal voice. Mirror attempted without success    Eyes:     Extraocular Movements: Extraocular movements intact.     Conjunctiva/sclera: Conjunctivae normal.      Pupils: Pupils are equal, round, and reactive to light.  Cardiovascular:     Rate and Rhythm: Normal rate.  Pulmonary:     Effort: Pulmonary effort is normal.  Musculoskeletal:     Cervical back: Normal range of motion and neck supple. No rigidity.  Lymphadenopathy:     Cervical: No cervical adenopathy or masses.salivary glands without lesions. .     Salivary glands- no mass or swelling Neurological:     Mental Status: He is alert. CN 2-12 intact. No nystagmus  Flexible fibroptic laryngoscopy  Patient was informed of risks, benefits, and options. All questions answered. Consent obtained.   The scope was passed through the nose and tracked into the nasopharynx. The nasopharynx without lesions or masses. The scope was positioned over the base of tongue and epiglottis. There was no obvious lesions or significant swelling and any of the laryngeal or pharyngeal structures.  There is no further evidence of any swelling and definitely no evidence of a mass.  The vocal cords move well. The subglottis has minimal visualization but no lesions identified. The scope was removed without difficulty and patient tolerated well.      Assessment/Plan: Possible laryngeal mass/edema-there is no evidence now of any mass and  all the swelling has resolved that she previously had in the hospital.  I do not think a CT scan repeat is necessary as I think this was all the infectious reactive pathology that has now resolved.  She agrees.  She is symptom-free.  She will follow-up if she has any further issues or problems.  [name] 03/27/2024, 1:13 PM        [1]  Allergies Allergen Reactions   Pradaxa [Dabigatran Etexilate Mesylate] Other (See Comments)    INTERNAL BLEEDING   Clindamycin/Lincomycin Other (See Comments)    PT STATES HER DOCTOR TOLD HER NOT TO TAKE CLINDAMYCIN BECAUSE SHE GOT C-DIFF AFTER TAKING AMPICILLIN - tolerated in 2022 (per spouse)   Latex Other (See Comments)    Blisters     Ampicillin  Other (See Comments)    C. Diff after taking ampicillin  - tolerated in 2022 (per husband) Pt has received cephalosporins in 2013, 2014, 2017, 2019, 2022    Sulfamethoxazole Diarrhea

## 2024-05-01 ENCOUNTER — Other Ambulatory Visit (HOSPITAL_COMMUNITY): Payer: Self-pay
